# Patient Record
Sex: Female | Born: 1945 | Race: Black or African American | Hispanic: No | Smoking: Never smoker
Health system: Southern US, Community
[De-identification: ages and names within clinical notes are randomized; demographics above are authoritative.]

## PROBLEM LIST (undated history)

## (undated) DIAGNOSIS — E119 Type 2 diabetes mellitus without complications: Secondary | ICD-10-CM

## (undated) DIAGNOSIS — B1920 Unspecified viral hepatitis C without hepatic coma: Secondary | ICD-10-CM

## (undated) DIAGNOSIS — C220 Liver cell carcinoma: Secondary | ICD-10-CM

## (undated) DIAGNOSIS — C189 Malignant neoplasm of colon, unspecified: Secondary | ICD-10-CM

## (undated) DIAGNOSIS — I10 Essential (primary) hypertension: Secondary | ICD-10-CM

## (undated) DIAGNOSIS — H409 Unspecified glaucoma: Secondary | ICD-10-CM

## (undated) HISTORY — DX: Unspecified viral hepatitis C without hepatic coma: B19.20

## (undated) HISTORY — DX: Malignant neoplasm of colon, unspecified: C18.9

## (undated) HISTORY — DX: Liver cell carcinoma: C22.0

---

## 2003-02-16 HISTORY — PX: COLON RESECTION: SHX5231

## 2020-08-08 ENCOUNTER — Other Ambulatory Visit: Payer: Self-pay

## 2020-08-08 ENCOUNTER — Encounter (HOSPITAL_COMMUNITY): Payer: Self-pay

## 2020-08-08 ENCOUNTER — Inpatient Hospital Stay (HOSPITAL_COMMUNITY)
Admission: EM | Admit: 2020-08-08 | Discharge: 2020-08-18 | DRG: 327 | Disposition: A | Discharge status: Home / Self Care | Payer: Medicaid Other | Attending: Surgery | Admitting: Surgery

## 2020-08-08 DIAGNOSIS — J90 Pleural effusion, not elsewhere classified: Secondary | ICD-10-CM

## 2020-08-08 DIAGNOSIS — Z9049 Acquired absence of other specified parts of digestive tract: Secondary | ICD-10-CM

## 2020-08-08 DIAGNOSIS — K5909 Other constipation: Secondary | ICD-10-CM

## 2020-08-08 DIAGNOSIS — E860 Dehydration: Secondary | ICD-10-CM | POA: Diagnosis present

## 2020-08-08 DIAGNOSIS — R160 Hepatomegaly, not elsewhere classified: Secondary | ICD-10-CM

## 2020-08-08 DIAGNOSIS — E876 Hypokalemia: Secondary | ICD-10-CM | POA: Diagnosis present

## 2020-08-08 DIAGNOSIS — K631 Perforation of intestine (nontraumatic): Secondary | ICD-10-CM

## 2020-08-08 DIAGNOSIS — E871 Hypo-osmolality and hyponatremia: Secondary | ICD-10-CM | POA: Diagnosis present

## 2020-08-08 DIAGNOSIS — C220 Liver cell carcinoma: Secondary | ICD-10-CM

## 2020-08-08 DIAGNOSIS — N183 Chronic kidney disease, stage 3 unspecified: Secondary | ICD-10-CM | POA: Diagnosis present

## 2020-08-08 DIAGNOSIS — B1920 Unspecified viral hepatitis C without hepatic coma: Secondary | ICD-10-CM | POA: Diagnosis present

## 2020-08-08 DIAGNOSIS — K255 Chronic or unspecified gastric ulcer with perforation: Principal | ICD-10-CM | POA: Diagnosis present

## 2020-08-08 DIAGNOSIS — E1122 Type 2 diabetes mellitus with diabetic chronic kidney disease: Secondary | ICD-10-CM | POA: Diagnosis present

## 2020-08-08 DIAGNOSIS — R188 Other ascites: Secondary | ICD-10-CM | POA: Diagnosis present

## 2020-08-08 DIAGNOSIS — R0902 Hypoxemia: Secondary | ICD-10-CM | POA: Diagnosis not present

## 2020-08-08 DIAGNOSIS — H409 Unspecified glaucoma: Secondary | ICD-10-CM | POA: Diagnosis present

## 2020-08-08 DIAGNOSIS — Z9889 Other specified postprocedural states: Secondary | ICD-10-CM

## 2020-08-08 DIAGNOSIS — Z794 Long term (current) use of insulin: Secondary | ICD-10-CM

## 2020-08-08 DIAGNOSIS — Z20822 Contact with and (suspected) exposure to covid-19: Secondary | ICD-10-CM | POA: Diagnosis present

## 2020-08-08 DIAGNOSIS — N179 Acute kidney failure, unspecified: Secondary | ICD-10-CM | POA: Diagnosis not present

## 2020-08-08 DIAGNOSIS — D62 Acute posthemorrhagic anemia: Secondary | ICD-10-CM | POA: Diagnosis not present

## 2020-08-08 DIAGNOSIS — E1159 Type 2 diabetes mellitus with other circulatory complications: Secondary | ICD-10-CM

## 2020-08-08 DIAGNOSIS — I129 Hypertensive chronic kidney disease with stage 1 through stage 4 chronic kidney disease, or unspecified chronic kidney disease: Secondary | ICD-10-CM | POA: Diagnosis present

## 2020-08-08 DIAGNOSIS — E119 Type 2 diabetes mellitus without complications: Secondary | ICD-10-CM

## 2020-08-08 DIAGNOSIS — I9589 Other hypotension: Secondary | ICD-10-CM | POA: Diagnosis present

## 2020-08-08 DIAGNOSIS — Z23 Encounter for immunization: Secondary | ICD-10-CM

## 2020-08-08 DIAGNOSIS — I152 Hypertension secondary to endocrine disorders: Secondary | ICD-10-CM

## 2020-08-08 DIAGNOSIS — Z85048 Personal history of other malignant neoplasm of rectum, rectosigmoid junction, and anus: Secondary | ICD-10-CM

## 2020-08-08 DIAGNOSIS — E1165 Type 2 diabetes mellitus with hyperglycemia: Secondary | ICD-10-CM | POA: Diagnosis present

## 2020-08-08 DIAGNOSIS — K668 Other specified disorders of peritoneum: Secondary | ICD-10-CM

## 2020-08-08 DIAGNOSIS — R739 Hyperglycemia, unspecified: Secondary | ICD-10-CM

## 2020-08-08 DIAGNOSIS — B9681 Helicobacter pylori [H. pylori] as the cause of diseases classified elsewhere: Secondary | ICD-10-CM | POA: Diagnosis present

## 2020-08-08 HISTORY — DX: Essential (primary) hypertension: I10

## 2020-08-08 HISTORY — DX: Unspecified glaucoma: H40.9

## 2020-08-08 HISTORY — DX: Type 2 diabetes mellitus without complications: E11.9

## 2020-08-08 MED ORDER — SODIUM CHLORIDE 0.9 % IV BOLUS
1000.0000 mL | Freq: Once | INTRAVENOUS | Status: AC
  Administered 2020-08-09: 1000 mL via INTRAVENOUS

## 2020-08-08 MED ORDER — ONDANSETRON 8 MG PO TBDP
8.0000 mg | ORAL_TABLET | Freq: Once | ORAL | Status: AC
  Administered 2020-08-08: 8 mg via ORAL
  Filled 2020-08-08: qty 1

## 2020-08-08 NOTE — ED Provider Notes (Signed)
[facility] DEPT Provider Note   CSN: [id number] Arrival date & time: 08/08/20  2156     History No chief complaint on file.   [name] [name] [name] [name] is a 75 y.o. female.  HPI  Patient with history of hypertension and diabetes presents with concerns about low blood pressure, abdominal pain, nausea, vomiting.  This started Tuesday afternoon after getting the [place] booster on Monday.  She has had multiple episodes of vomiting, 1 today.  She has been feeling weak, dehydrated.  States her abdomen started hurting 3 days ago.  It is diffuse and constant.   She has known history of a heart murmur, takes losartan and nifedipine.  No history of blood clots, not on any blood thinners.  She is not having any chest pain or shortness of breath.  She has been feeling constipated so took MiraLAX earlier today.  No past medical history on file.  There are no problems to display for this patient.      OB History   No obstetric history on file.     No family history on file.     Home Medications Prior to Admission medications   Not on File    Allergies    Patient has no allergy information on record.  Review of Systems   Review of Systems  Constitutional:  Positive for appetite change, fatigue and fever. Negative for chills.  HENT:  Negative for ear pain and sore throat.   Eyes:  Negative for pain and visual disturbance.  Respiratory:  Negative for cough, chest tightness and shortness of breath.   Cardiovascular:  Negative for chest pain and palpitations.  Gastrointestinal:  Positive for abdominal distention, abdominal pain, constipation, nausea and vomiting.  Genitourinary:  Negative for dysuria and hematuria.  Musculoskeletal:  Negative for arthralgias and back pain.  Skin:  Negative for color change and rash.  Neurological:  Positive for weakness. Negative for seizures and syncope.  All other systems reviewed and are negative.  Physical  Exam Updated Vital Signs BP (!) 157/70   Pulse 90   Temp 98.5 F (36.9 C) (Oral)   Resp 18   SpO2 95%   Physical Exam Vitals and nursing note reviewed.  Constitutional:      General: She is not in acute distress.    Appearance: She is well-developed. She is ill-appearing.     Comments: Patient appears chronically ill.  HENT:     Head: Normocephalic and atraumatic.     Mouth/Throat:     Mouth: Mucous membranes are dry.  Eyes:     Conjunctiva/sclera: Conjunctivae normal.  Cardiovascular:     Rate and Rhythm: Normal rate and regular rhythm.     Heart sounds: Murmur heard.  Pulmonary:     Effort: Pulmonary effort is normal. No respiratory distress.     Breath sounds: Normal breath sounds.  Abdominal:     General: There is distension.     Palpations: Abdomen is soft.     Tenderness: There is abdominal tenderness. There is guarding.     Comments: Abdominal tenderness diffuse.  Abdomen is distended, but has been for multiple months.  There is guarding on exam.  Musculoskeletal:     Cervical back: Neck supple.  Skin:    General: Skin is warm and dry.  Neurological:     Mental Status: She is alert.    ED Results / Procedures / Treatments   Labs (all labs ordered are listed, but only abnormal results are  displayed) Labs Reviewed - No data to display  EKG None  Radiology No results found.  Procedures Procedures   Medications Ordered in ED Medications - No data to display  ED Course  I have reviewed the triage vital signs and the nursing notes.  Pertinent labs & imaging results that were available during my care of the patient were reviewed by me and considered in my medical decision making (see chart for details).    MDM Rules/Calculators/A&P                         Patient is a 75 year old female with history of hypertension and diabetes presenting for nausea, vomiting, abdominal pain.  She is hypertensive, but otherwise vitals are stable.  Ordered abdominal  pain work-up and CT abdomen.  She is diffusely tender to palpation to the abdomen.  Abdomen is also somewhat distended.  There is guarding.    I have placed lab works for broad abdominal pain work-up.  Also put an order for CT scan pending creatinine from CMP.  Fortunately, this is very early in the work-up and at the end of my shift.    I will sign out care to [name], PA-C.  Disposition pending further work-up.  Final Clinical Impression(s) / ED Diagnoses Final diagnoses:  None    Rx / DC Orders ED Discharge Orders     None        Sherrill Raring, Hershal Coria 08/12/20 [place], Wenda Overland, MD 08/13/20 1505

## 2020-08-08 NOTE — ED Triage Notes (Signed)
Pt reports abdominal pain and N/V since Tuesday. Pt has been unable to keep anything down and the pain has become more severe.

## 2020-08-09 ENCOUNTER — Emergency Department (HOSPITAL_COMMUNITY): Payer: Medicaid Other | Admitting: Anesthesiology

## 2020-08-09 ENCOUNTER — Emergency Department (HOSPITAL_COMMUNITY): Payer: Medicaid Other

## 2020-08-09 ENCOUNTER — Encounter (HOSPITAL_COMMUNITY): Admission: EM | Disposition: A | Discharge status: Home / Self Care | Payer: Self-pay | Source: Home / Self Care

## 2020-08-09 ENCOUNTER — Encounter (HOSPITAL_COMMUNITY): Payer: Self-pay | Admitting: Radiology

## 2020-08-09 DIAGNOSIS — B1920 Unspecified viral hepatitis C without hepatic coma: Secondary | ICD-10-CM | POA: Diagnosis present

## 2020-08-09 DIAGNOSIS — C220 Liver cell carcinoma: Secondary | ICD-10-CM | POA: Diagnosis present

## 2020-08-09 DIAGNOSIS — R188 Other ascites: Secondary | ICD-10-CM | POA: Diagnosis present

## 2020-08-09 DIAGNOSIS — E860 Dehydration: Secondary | ICD-10-CM | POA: Diagnosis present

## 2020-08-09 DIAGNOSIS — K5909 Other constipation: Secondary | ICD-10-CM

## 2020-08-09 DIAGNOSIS — I129 Hypertensive chronic kidney disease with stage 1 through stage 4 chronic kidney disease, or unspecified chronic kidney disease: Secondary | ICD-10-CM | POA: Diagnosis present

## 2020-08-09 DIAGNOSIS — Z20822 Contact with and (suspected) exposure to covid-19: Secondary | ICD-10-CM | POA: Diagnosis present

## 2020-08-09 DIAGNOSIS — E1165 Type 2 diabetes mellitus with hyperglycemia: Secondary | ICD-10-CM | POA: Diagnosis present

## 2020-08-09 DIAGNOSIS — K255 Chronic or unspecified gastric ulcer with perforation: Secondary | ICD-10-CM | POA: Diagnosis present

## 2020-08-09 DIAGNOSIS — D62 Acute posthemorrhagic anemia: Secondary | ICD-10-CM | POA: Diagnosis not present

## 2020-08-09 DIAGNOSIS — K631 Perforation of intestine (nontraumatic): Secondary | ICD-10-CM | POA: Diagnosis present

## 2020-08-09 DIAGNOSIS — E119 Type 2 diabetes mellitus without complications: Secondary | ICD-10-CM

## 2020-08-09 DIAGNOSIS — Z9049 Acquired absence of other specified parts of digestive tract: Secondary | ICD-10-CM | POA: Diagnosis not present

## 2020-08-09 DIAGNOSIS — I9589 Other hypotension: Secondary | ICD-10-CM | POA: Diagnosis present

## 2020-08-09 DIAGNOSIS — B9681 Helicobacter pylori [H. pylori] as the cause of diseases classified elsewhere: Secondary | ICD-10-CM | POA: Diagnosis present

## 2020-08-09 DIAGNOSIS — R160 Hepatomegaly, not elsewhere classified: Secondary | ICD-10-CM

## 2020-08-09 DIAGNOSIS — N179 Acute kidney failure, unspecified: Secondary | ICD-10-CM | POA: Diagnosis not present

## 2020-08-09 DIAGNOSIS — R0902 Hypoxemia: Secondary | ICD-10-CM | POA: Diagnosis not present

## 2020-08-09 DIAGNOSIS — E871 Hypo-osmolality and hyponatremia: Secondary | ICD-10-CM | POA: Diagnosis present

## 2020-08-09 DIAGNOSIS — Z85048 Personal history of other malignant neoplasm of rectum, rectosigmoid junction, and anus: Secondary | ICD-10-CM

## 2020-08-09 DIAGNOSIS — Z794 Long term (current) use of insulin: Secondary | ICD-10-CM

## 2020-08-09 DIAGNOSIS — K668 Other specified disorders of peritoneum: Secondary | ICD-10-CM

## 2020-08-09 DIAGNOSIS — Z23 Encounter for immunization: Secondary | ICD-10-CM | POA: Diagnosis not present

## 2020-08-09 DIAGNOSIS — H409 Unspecified glaucoma: Secondary | ICD-10-CM | POA: Diagnosis present

## 2020-08-09 DIAGNOSIS — E876 Hypokalemia: Secondary | ICD-10-CM | POA: Diagnosis present

## 2020-08-09 DIAGNOSIS — E1159 Type 2 diabetes mellitus with other circulatory complications: Secondary | ICD-10-CM

## 2020-08-09 DIAGNOSIS — N183 Chronic kidney disease, stage 3 unspecified: Secondary | ICD-10-CM | POA: Diagnosis present

## 2020-08-09 DIAGNOSIS — I10 Essential (primary) hypertension: Secondary | ICD-10-CM

## 2020-08-09 DIAGNOSIS — E1122 Type 2 diabetes mellitus with diabetic chronic kidney disease: Secondary | ICD-10-CM | POA: Diagnosis present

## 2020-08-09 HISTORY — PX: LAPAROSCOPIC GASTRIC RESECTION: SHX1936

## 2020-08-09 LAB — COMPREHENSIVE METABOLIC PANEL
ALT: 63 U/L — ABNORMAL HIGH (ref 0–44)
AST: 43 U/L — ABNORMAL HIGH (ref 15–41)
Albumin: 3.9 g/dL (ref 3.5–5.0)
Alkaline Phosphatase: 157 U/L — ABNORMAL HIGH (ref 38–126)
Anion gap: 10 (ref 5–15)
BUN: 22 mg/dL (ref 8–23)
CO2: 24 mmol/L (ref 22–32)
Calcium: 10.2 mg/dL (ref 8.9–10.3)
Chloride: 98 mmol/L (ref 98–111)
Creatinine, Ser: 0.71 mg/dL (ref 0.44–1.00)
GFR, Estimated: >60 mL/min (ref >60)
Glucose, Bld: 118 mg/dL — ABNORMAL HIGH (ref 70–99)
Potassium: 3.2 mmol/L — ABNORMAL LOW (ref 3.5–5.1)
Sodium: 132 mmol/L — ABNORMAL LOW (ref 135–145)
Total Bilirubin: 1.1 mg/dL (ref 0.3–1.2)
Total Protein: 7.9 g/dL (ref 6.5–8.1)

## 2020-08-09 LAB — CBC WITH DIFFERENTIAL/PLATELET
Abs Immature Granulocytes: 0.02 10*3/uL (ref 0.00–0.07)
Basophils Absolute: 0 10*3/uL (ref 0.0–0.1)
Basophils Relative: 0 %
Eosinophils Absolute: 0 10*3/uL (ref 0.0–0.5)
Eosinophils Relative: 0 %
HCT: 46.3 % — ABNORMAL HIGH (ref 36.0–46.0)
Hemoglobin: 15.6 g/dL — ABNORMAL HIGH (ref 12.0–15.0)
Immature Granulocytes: 0 %
Lymphocytes Relative: 6 %
Lymphs Abs: 0.5 10*3/uL — ABNORMAL LOW (ref 0.7–4.0)
MCH: 30.8 pg (ref 26.0–34.0)
MCHC: 33.7 g/dL (ref 30.0–36.0)
MCV: 91.5 fL (ref 80.0–100.0)
Monocytes Absolute: 0.5 10*3/uL (ref 0.1–1.0)
Monocytes Relative: 5 %
Neutro Abs: 8.6 10*3/uL — ABNORMAL HIGH (ref 1.7–7.7)
Neutrophils Relative %: 89 %
Platelets: 248 10*3/uL (ref 150–400)
RBC: 5.06 MIL/uL (ref 3.87–5.11)
RDW: 13.9 % (ref 11.5–15.5)
WBC: 9.7 10*3/uL (ref 4.0–10.5)
nRBC: 0 % (ref 0.0–0.2)

## 2020-08-09 LAB — URINALYSIS, ROUTINE W REFLEX MICROSCOPIC
Bilirubin Urine: NEGATIVE
Glucose, UA: NEGATIVE mg/dL
Hgb urine dipstick: NEGATIVE
Ketones, ur: 20 mg/dL — AB
Nitrite: NEGATIVE
Protein, ur: NEGATIVE mg/dL
Specific Gravity, Urine: 1.016 (ref 1.005–1.030)
pH: 5 (ref 5.0–8.0)

## 2020-08-09 LAB — GLUCOSE, CAPILLARY
Glucose-Capillary: 149 mg/dL — ABNORMAL HIGH (ref 70–99)
Glucose-Capillary: 296 mg/dL — ABNORMAL HIGH (ref 70–99)
Glucose-Capillary: 279 mg/dL — ABNORMAL HIGH (ref 70–99)

## 2020-08-09 LAB — RESP PANEL BY RT-PCR (FLU A&B, COVID) ARPGX2
Influenza A by PCR: NEGATIVE
Influenza B by PCR: NEGATIVE
SARS Coronavirus 2 by RT PCR: NEGATIVE

## 2020-08-09 LAB — HEMOGLOBIN A1C
Hgb A1c MFr Bld: 5.8 % — ABNORMAL HIGH (ref 4.8–5.6)
Mean Plasma Glucose: 119.76 mg/dL

## 2020-08-09 LAB — LIPASE, BLOOD: Lipase: 46 U/L (ref 11–51)

## 2020-08-09 SURGERY — LAPAROSCOPIC GASTRIC RESECTION
Anesthesia: General | Site: Abdomen

## 2020-08-09 MED ORDER — ENOXAPARIN SODIUM 40 MG/0.4ML IJ SOSY
40.0000 mg | PREFILLED_SYRINGE | INTRAMUSCULAR | Status: DC
  Administered 2020-08-10: 40 mg via SUBCUTANEOUS
  Filled 2020-08-09: qty 0.4

## 2020-08-09 MED ORDER — FENTANYL CITRATE (PF) 100 MCG/2ML IJ SOLN: 25.0000 ug | INTRAMUSCULAR | Status: DC | PRN

## 2020-08-09 MED ORDER — SODIUM CHLORIDE 0.9 % IV SOLN: Freq: Three times a day (TID) | INTRAVENOUS | Status: DC | PRN

## 2020-08-09 MED ORDER — DEXAMETHASONE SODIUM PHOSPHATE 10 MG/ML IJ SOLN
INTRAMUSCULAR | Status: DC | PRN
  Administered 2020-08-09: 5 mg via INTRAVENOUS

## 2020-08-09 MED ORDER — ROCURONIUM BROMIDE 10 MG/ML (PF) SYRINGE
PREFILLED_SYRINGE | INTRAVENOUS | Status: AC
  Filled 2020-08-09: qty 10

## 2020-08-09 MED ORDER — SUCCINYLCHOLINE CHLORIDE 200 MG/10ML IV SOSY
PREFILLED_SYRINGE | INTRAVENOUS | Status: AC
  Filled 2020-08-09: qty 30

## 2020-08-09 MED ORDER — FENTANYL CITRATE (PF) 100 MCG/2ML IJ SOLN
INTRAMUSCULAR | Status: AC
  Filled 2020-08-09: qty 2

## 2020-08-09 MED ORDER — ONDANSETRON HCL 4 MG/2ML IJ SOLN
INTRAMUSCULAR | Status: AC
  Filled 2020-08-09: qty 2

## 2020-08-09 MED ORDER — ONDANSETRON HCL 4 MG/2ML IJ SOLN: 4.0000 mg | Freq: Four times a day (QID) | INTRAMUSCULAR | Status: DC | PRN

## 2020-08-09 MED ORDER — LACTATED RINGERS IV SOLN: INTRAVENOUS | Status: DC | PRN

## 2020-08-09 MED ORDER — DIPHENHYDRAMINE HCL 50 MG/ML IJ SOLN
12.5000 mg | Freq: Four times a day (QID) | INTRAMUSCULAR | Status: DC | PRN
  Administered 2020-08-14: 12.5 mg via INTRAVENOUS
  Filled 2020-08-09: qty 1

## 2020-08-09 MED ORDER — CHLORHEXIDINE GLUCONATE CLOTH 2 % EX PADS
6.0000 | MEDICATED_PAD | Freq: Once | CUTANEOUS | Status: AC
  Administered 2020-08-09: 6 via TOPICAL

## 2020-08-09 MED ORDER — EPHEDRINE 5 MG/ML INJ
INTRAVENOUS | Status: AC
  Filled 2020-08-09: qty 20

## 2020-08-09 MED ORDER — SODIUM CHLORIDE 0.9 % IV SOLN
2.0000 g | INTRAVENOUS | Status: AC
  Administered 2020-08-09: 2 g via INTRAVENOUS
  Filled 2020-08-09: qty 2

## 2020-08-09 MED ORDER — SIMETHICONE 80 MG PO CHEW
40.0000 mg | CHEWABLE_TABLET | Freq: Four times a day (QID) | ORAL | Status: DC | PRN
  Administered 2020-08-17 – 2020-08-18 (×2): 40 mg via ORAL
  Filled 2020-08-09 (×2): qty 1

## 2020-08-09 MED ORDER — ACETAMINOPHEN 650 MG RE SUPP
650.0000 mg | Freq: Four times a day (QID) | RECTAL | Status: DC | PRN
  Administered 2020-08-11: 650 mg via RECTAL
  Filled 2020-08-09: qty 1

## 2020-08-09 MED ORDER — PANTOPRAZOLE SODIUM 40 MG IV SOLR
40.0000 mg | Freq: Two times a day (BID) | INTRAVENOUS | Status: DC
  Administered 2020-08-12 – 2020-08-18 (×12): 40 mg via INTRAVENOUS
  Filled 2020-08-09 (×12): qty 40

## 2020-08-09 MED ORDER — INSULIN ASPART 100 UNIT/ML IJ SOLN: 0.0000 [IU] | Freq: Three times a day (TID) | INTRAMUSCULAR | Status: DC

## 2020-08-09 MED ORDER — MENTHOL 3 MG MT LOZG
1.0000 | LOZENGE | OROMUCOSAL | Status: DC | PRN
Start: 2020-08-09 — End: 2020-08-18
  Filled 2020-08-09: qty 9

## 2020-08-09 MED ORDER — PIPERACILLIN-TAZOBACTAM 3.375 G IVPB 30 MIN
3.3750 g | Freq: Once | INTRAVENOUS | Status: AC
  Administered 2020-08-09: 3.375 g via INTRAVENOUS
  Filled 2020-08-09: qty 50

## 2020-08-09 MED ORDER — ROCURONIUM BROMIDE 10 MG/ML (PF) SYRINGE
PREFILLED_SYRINGE | INTRAVENOUS | Status: DC | PRN
  Administered 2020-08-09: 20 mg via INTRAVENOUS
  Administered 2020-08-09: 40 mg via INTRAVENOUS
  Administered 2020-08-09: 20 mg via INTRAVENOUS
  Administered 2020-08-09: 40 mg via INTRAVENOUS
  Administered 2020-08-09: 20 mg via INTRAVENOUS

## 2020-08-09 MED ORDER — ALUM & MAG HYDROXIDE-SIMETH 200-200-20 MG/5ML PO SUSP: 30.0000 mL | Freq: Four times a day (QID) | ORAL | Status: DC | PRN

## 2020-08-09 MED ORDER — PANTOPRAZOLE INFUSION (NEW) - SIMPLE MED
8.0000 mg/h | INTRAVENOUS | Status: AC
  Administered 2020-08-09 – 2020-08-12 (×7): 8 mg/h via INTRAVENOUS
  Filled 2020-08-09 (×2): qty 80
  Filled 2020-08-09 (×2): qty 100
  Filled 2020-08-09 (×2): qty 80
  Filled 2020-08-09: qty 100
  Filled 2020-08-09 (×2): qty 80
  Filled 2020-08-09: qty 100

## 2020-08-09 MED ORDER — INSULIN ASPART 100 UNIT/ML IJ SOLN
0.0000 [IU] | INTRAMUSCULAR | Status: DC
  Administered 2020-08-09: 8 [IU] via SUBCUTANEOUS
  Administered 2020-08-09: 3 [IU] via SUBCUTANEOUS
  Administered 2020-08-10: 2 [IU] via SUBCUTANEOUS
  Administered 2020-08-10: 3 [IU] via SUBCUTANEOUS
  Administered 2020-08-10: 2 [IU] via SUBCUTANEOUS
  Administered 2020-08-10: 8 [IU] via SUBCUTANEOUS
  Administered 2020-08-10: 3 [IU] via SUBCUTANEOUS
  Administered 2020-08-10: 5 [IU] via SUBCUTANEOUS
  Administered 2020-08-11 – 2020-08-12 (×4): 2 [IU] via SUBCUTANEOUS
  Administered 2020-08-12: 3 [IU] via SUBCUTANEOUS
  Administered 2020-08-12 – 2020-08-13 (×3): 2 [IU] via SUBCUTANEOUS
  Administered 2020-08-13: 3 [IU] via SUBCUTANEOUS
  Administered 2020-08-13 (×2): 2 [IU] via SUBCUTANEOUS
  Administered 2020-08-14: 3 [IU] via SUBCUTANEOUS
  Administered 2020-08-14: 5 [IU] via SUBCUTANEOUS
  Administered 2020-08-14: 2 [IU] via SUBCUTANEOUS
  Administered 2020-08-14: 3 [IU] via SUBCUTANEOUS
  Administered 2020-08-14: 2 [IU] via SUBCUTANEOUS
  Administered 2020-08-15 (×3): 3 [IU] via SUBCUTANEOUS
  Administered 2020-08-15: 5 [IU] via SUBCUTANEOUS
  Administered 2020-08-15 (×3): 3 [IU] via SUBCUTANEOUS
  Administered 2020-08-16: 5 [IU] via SUBCUTANEOUS
  Administered 2020-08-16: 8 [IU] via SUBCUTANEOUS
  Administered 2020-08-16 (×2): 5 [IU] via SUBCUTANEOUS
  Administered 2020-08-16 – 2020-08-17 (×3): 3 [IU] via SUBCUTANEOUS
  Administered 2020-08-17: 11 [IU] via SUBCUTANEOUS
  Administered 2020-08-17: 3 [IU] via SUBCUTANEOUS
  Administered 2020-08-17: 2 [IU] via SUBCUTANEOUS
  Administered 2020-08-17 (×2): 5 [IU] via SUBCUTANEOUS
  Administered 2020-08-18: 2 [IU] via SUBCUTANEOUS
  Administered 2020-08-18: 3 [IU] via SUBCUTANEOUS

## 2020-08-09 MED ORDER — LACTATED RINGERS IR SOLN
Status: DC | PRN
  Administered 2020-08-09: 1000 mL

## 2020-08-09 MED ORDER — LIDOCAINE 2% (20 MG/ML) 5 ML SYRINGE
INTRAMUSCULAR | Status: AC
  Filled 2020-08-09: qty 15

## 2020-08-09 MED ORDER — ENALAPRILAT 1.25 MG/ML IV SOLN
0.6250 mg | Freq: Four times a day (QID) | INTRAVENOUS | Status: DC | PRN
  Filled 2020-08-09: qty 1

## 2020-08-09 MED ORDER — PROPOFOL 10 MG/ML IV BOLUS
INTRAVENOUS | Status: DC | PRN
  Administered 2020-08-09: 110 mg via INTRAVENOUS

## 2020-08-09 MED ORDER — ALBUMIN HUMAN 5 % IV SOLN
12.5000 g | Freq: Four times a day (QID) | INTRAVENOUS | Status: AC | PRN
  Filled 2020-08-09: qty 250

## 2020-08-09 MED ORDER — SUGAMMADEX SODIUM 500 MG/5ML IV SOLN
INTRAVENOUS | Status: DC | PRN
  Administered 2020-08-09: 500 mg via INTRAVENOUS

## 2020-08-09 MED ORDER — LACTATED RINGERS IV SOLN: INTRAVENOUS | Status: DC

## 2020-08-09 MED ORDER — BISACODYL 10 MG RE SUPP
10.0000 mg | Freq: Every day | RECTAL | Status: DC
  Administered 2020-08-11 – 2020-08-18 (×6): 10 mg via RECTAL
  Filled 2020-08-09 (×8): qty 1

## 2020-08-09 MED ORDER — FENTANYL CITRATE (PF) 100 MCG/2ML IJ SOLN
INTRAMUSCULAR | Status: DC | PRN
  Administered 2020-08-09: 25 ug via INTRAVENOUS
  Administered 2020-08-09: 50 ug via INTRAVENOUS
  Administered 2020-08-09: 25 ug via INTRAVENOUS
  Administered 2020-08-09: 50 ug via INTRAVENOUS

## 2020-08-09 MED ORDER — HYDROMORPHONE HCL 1 MG/ML IJ SOLN
0.5000 mg | INTRAMUSCULAR | Status: DC | PRN
  Administered 2020-08-09 – 2020-08-10 (×2): 1 mg via INTRAVENOUS
  Filled 2020-08-09 (×2): qty 1

## 2020-08-09 MED ORDER — 0.9 % SODIUM CHLORIDE (POUR BTL) OPTIME
TOPICAL | Status: DC | PRN
  Administered 2020-08-09: 2000 mL

## 2020-08-09 MED ORDER — LIP MEDEX EX OINT
1.0000 "application " | TOPICAL_OINTMENT | Freq: Two times a day (BID) | CUTANEOUS | Status: DC
  Administered 2020-08-09 – 2020-08-17 (×18): 1 via TOPICAL
  Filled 2020-08-09 (×4): qty 7

## 2020-08-09 MED ORDER — METOPROLOL TARTRATE 5 MG/5ML IV SOLN: 5.0000 mg | Freq: Four times a day (QID) | INTRAVENOUS | Status: DC | PRN

## 2020-08-09 MED ORDER — EPHEDRINE SULFATE-NACL 50-0.9 MG/10ML-% IV SOSY
PREFILLED_SYRINGE | INTRAVENOUS | Status: DC | PRN
  Administered 2020-08-09: 10 mg via INTRAVENOUS

## 2020-08-09 MED ORDER — METHOCARBAMOL 1000 MG/10ML IJ SOLN
1000.0000 mg | Freq: Four times a day (QID) | INTRAVENOUS | Status: DC | PRN
  Filled 2020-08-09: qty 10

## 2020-08-09 MED ORDER — PROCHLORPERAZINE EDISYLATE 10 MG/2ML IJ SOLN: 5.0000 mg | INTRAMUSCULAR | Status: DC | PRN

## 2020-08-09 MED ORDER — GABAPENTIN 300 MG PO CAPS
300.0000 mg | ORAL_CAPSULE | ORAL | Status: AC
  Administered 2020-08-09: 300 mg via ORAL
  Filled 2020-08-09: qty 1

## 2020-08-09 MED ORDER — PROPOFOL 10 MG/ML IV BOLUS
INTRAVENOUS | Status: AC
  Filled 2020-08-09: qty 20

## 2020-08-09 MED ORDER — PIPERACILLIN-TAZOBACTAM 3.375 G IVPB
3.3750 g | Freq: Three times a day (TID) | INTRAVENOUS | Status: DC
  Administered 2020-08-09 – 2020-08-13 (×13): 3.375 g via INTRAVENOUS
  Filled 2020-08-09 (×13): qty 50

## 2020-08-09 MED ORDER — PHENOL 1.4 % MT LIQD
2.0000 | OROMUCOSAL | Status: DC | PRN
Start: 2020-08-09 — End: 2020-08-18
  Filled 2020-08-09: qty 177

## 2020-08-09 MED ORDER — SUCCINYLCHOLINE CHLORIDE 200 MG/10ML IV SOSY
PREFILLED_SYRINGE | INTRAVENOUS | Status: DC | PRN
  Administered 2020-08-09: 100 mg via INTRAVENOUS

## 2020-08-09 MED ORDER — LACTATED RINGERS IV BOLUS: 1000.0000 mL | Freq: Three times a day (TID) | INTRAVENOUS | Status: AC | PRN

## 2020-08-09 MED ORDER — PHENYLEPHRINE HCL (PRESSORS) 10 MG/ML IV SOLN
INTRAVENOUS | Status: AC
  Filled 2020-08-09: qty 1

## 2020-08-09 MED ORDER — LACTATED RINGERS IV BOLUS
1000.0000 mL | Freq: Once | INTRAVENOUS | Status: AC
  Administered 2020-08-09: 1000 mL via INTRAVENOUS

## 2020-08-09 MED ORDER — SIMETHICONE 40 MG/0.6ML PO SUSP
80.0000 mg | Freq: Four times a day (QID) | ORAL | Status: DC | PRN
  Filled 2020-08-09: qty 1.2

## 2020-08-09 MED ORDER — PANTOPRAZOLE 80MG IVPB - SIMPLE MED
80.0000 mg | Freq: Once | INTRAVENOUS | Status: AC
  Administered 2020-08-09: 80 mg via INTRAVENOUS
  Filled 2020-08-09: qty 80

## 2020-08-09 MED ORDER — SODIUM CHLORIDE 0.9 % IV BOLUS
1000.0000 mL | Freq: Once | INTRAVENOUS | Status: AC
  Administered 2020-08-09: 1000 mL via INTRAVENOUS

## 2020-08-09 MED ORDER — SUCCINYLCHOLINE CHLORIDE 200 MG/10ML IV SOSY
PREFILLED_SYRINGE | INTRAVENOUS | Status: AC
  Filled 2020-08-09: qty 10

## 2020-08-09 MED ORDER — MAGIC MOUTHWASH
15.0000 mL | Freq: Four times a day (QID) | ORAL | Status: DC | PRN
  Administered 2020-08-10: 15 mL via ORAL
  Filled 2020-08-09 (×3): qty 15

## 2020-08-09 MED ORDER — PHENYLEPHRINE HCL-NACL 10-0.9 MG/250ML-% IV SOLN
INTRAVENOUS | Status: DC | PRN
  Administered 2020-08-09: 50 ug/min via INTRAVENOUS

## 2020-08-09 MED ORDER — PANTOPRAZOLE SODIUM 40 MG IV SOLR: 40.0000 mg | Freq: Two times a day (BID) | INTRAVENOUS | Status: DC

## 2020-08-09 MED ORDER — CHLORHEXIDINE GLUCONATE CLOTH 2 % EX PADS
6.0000 | MEDICATED_PAD | Freq: Every day | CUTANEOUS | Status: DC
  Administered 2020-08-10 – 2020-08-17 (×8): 6 via TOPICAL

## 2020-08-09 MED ORDER — BISACODYL 10 MG RE SUPP: 10.0000 mg | Freq: Two times a day (BID) | RECTAL | Status: DC | PRN

## 2020-08-09 MED ORDER — LIDOCAINE HCL (CARDIAC) PF 100 MG/5ML IV SOSY
PREFILLED_SYRINGE | INTRAVENOUS | Status: DC | PRN
  Administered 2020-08-09: 40 mg via INTRAVENOUS

## 2020-08-09 MED ORDER — ALBUMIN HUMAN 5 % IV SOLN: INTRAVENOUS | Status: DC | PRN

## 2020-08-09 MED ORDER — ONDANSETRON HCL 4 MG/2ML IJ SOLN
INTRAMUSCULAR | Status: DC | PRN
  Administered 2020-08-09: 4 mg via INTRAVENOUS

## 2020-08-09 MED ORDER — PNEUMOCOCCAL VAC POLYVALENT 25 MCG/0.5ML IJ INJ
0.5000 mL | INJECTION | INTRAMUSCULAR | Status: AC
  Administered 2020-08-18: 0.5 mL via INTRAMUSCULAR
  Filled 2020-08-09: qty 0.5

## 2020-08-09 MED ORDER — SODIUM CHLORIDE 0.9 % IV SOLN
8.0000 mg | Freq: Four times a day (QID) | INTRAVENOUS | Status: DC | PRN
  Filled 2020-08-09: qty 4

## 2020-08-09 MED ORDER — SODIUM CHLORIDE (PF) 0.9 % IJ SOLN
INTRAMUSCULAR | Status: AC
  Filled 2020-08-09: qty 50

## 2020-08-09 MED ORDER — LIDOCAINE 2% (20 MG/ML) 5 ML SYRINGE
INTRAMUSCULAR | Status: AC
  Filled 2020-08-09: qty 5

## 2020-08-09 MED ORDER — IOHEXOL 300 MG/ML  SOLN
100.0000 mL | Freq: Once | INTRAMUSCULAR | Status: AC | PRN
  Administered 2020-08-09: 100 mL via INTRAVENOUS

## 2020-08-09 MED ORDER — BUPIVACAINE-EPINEPHRINE (PF) 0.25% -1:200000 IJ SOLN
INTRAMUSCULAR | Status: AC
  Filled 2020-08-09: qty 30

## 2020-08-09 MED ORDER — BUPIVACAINE-EPINEPHRINE 0.25% -1:200000 IJ SOLN
INTRAMUSCULAR | Status: DC | PRN
  Administered 2020-08-09: 6 mL

## 2020-08-09 MED ORDER — ROCURONIUM BROMIDE 10 MG/ML (PF) SYRINGE
PREFILLED_SYRINGE | INTRAVENOUS | Status: AC
  Filled 2020-08-09: qty 30

## 2020-08-09 MED ORDER — DEXAMETHASONE SODIUM PHOSPHATE 10 MG/ML IJ SOLN
INTRAMUSCULAR | Status: AC
  Filled 2020-08-09: qty 1

## 2020-08-09 SURGICAL SUPPLY — 48 items
CABLE HIGH FREQUENCY MONO STRZ (ELECTRODE) ×2 IMPLANT
COVER SURGICAL LIGHT HANDLE (MISCELLANEOUS) ×2 IMPLANT
DECANTER SPIKE VIAL GLASS SM (MISCELLANEOUS) ×2 IMPLANT
DRAPE UTILITY XL STRL (DRAPES) ×2 IMPLANT
DRAPE WARM FLUID 44X44 (DRAPES) ×2 IMPLANT
DRSG TEGADERM 2-3/8X2-3/4 SM (GAUZE/BANDAGES/DRESSINGS) ×8 IMPLANT
DRSG TEGADERM 4X4.75 (GAUZE/BANDAGES/DRESSINGS) IMPLANT
ELECT COAG MONOPOLAR (ELECTROSURGICAL) ×2
ELECT REM PT RETURN 15FT ADLT (MISCELLANEOUS) ×2 IMPLANT
ELECTRODE COAG MONOPOLAR (ELECTROSURGICAL) ×1 IMPLANT
GAUZE SPONGE 2X2 8PLY STRL LF (GAUZE/BANDAGES/DRESSINGS) ×1 IMPLANT
GLOVE SURG ENC MOIS LTX SZ7.5 (GLOVE) ×2 IMPLANT
GLOVE SURG ENC MOIS LTX SZ8 (GLOVE) ×2 IMPLANT
GLOVE SURG LTX SZ8 (GLOVE) ×4 IMPLANT
GLOVE SURG UNDER LTX SZ8 (GLOVE) ×2 IMPLANT
GOWN STRL REUS W/TWL XL LVL3 (GOWN DISPOSABLE) ×6 IMPLANT
IRRIG SUCT STRYKERFLOW 2 WTIP (MISCELLANEOUS) ×2
IRRIGATION SUCT STRKRFLW 2 WTP (MISCELLANEOUS) ×1 IMPLANT
KIT BASIN OR (CUSTOM PROCEDURE TRAY) ×2 IMPLANT
KIT TURNOVER KIT A (KITS) ×2 IMPLANT
NEEDLE BIOPSY 14X6 SOFT TISS (NEEDLE) ×2 IMPLANT
PAD POSITIONING PINK XL (MISCELLANEOUS) ×2 IMPLANT
PROTECTOR NERVE ULNAR (MISCELLANEOUS) ×2 IMPLANT
SCISSORS LAP 5X35 DISP (ENDOMECHANICALS) ×2 IMPLANT
SEALER TISSUE G2 STRG ARTC 35C (ENDOMECHANICALS) IMPLANT
SET TUBE SMOKE EVAC HIGH FLOW (TUBING) ×2 IMPLANT
SLEEVE XCEL OPT CAN 5 100 (ENDOMECHANICALS) ×4 IMPLANT
SPONGE GAUZE 2X2 8PLY STRL LF (GAUZE/BANDAGES/DRESSINGS) ×2 IMPLANT
SPONGE GAUZE 2X2 STER 10/PKG (GAUZE/BANDAGES/DRESSINGS) ×1
SUT MNCRL AB 4-0 PS2 18 (SUTURE) ×2 IMPLANT
SUT SILK 2 0 (SUTURE) ×1
SUT SILK 2 0 SH CR/8 (SUTURE) ×2 IMPLANT
SUT SILK 2-0 18XBRD TIE 12 (SUTURE) ×1 IMPLANT
SUT SILK 3 0 (SUTURE) ×1
SUT SILK 3 0 SH CR/8 (SUTURE) ×2 IMPLANT
SUT SILK 3-0 18XBRD TIE 12 (SUTURE) ×1 IMPLANT
SUT V-LOC BARB 180 2/0GR6 GS22 (SUTURE) ×4
SUT V-LOC BARB 180 2/0GR9 GS23 (SUTURE) ×2
SUTURE V-LC BRB 180 2/0GR6GS22 (SUTURE) ×2 IMPLANT
SUTURE V-LC BRB 180 2/0GR9GS23 (SUTURE) ×1 IMPLANT
SYS KII FIOS ACCESS ABD 5X100 (TROCAR) ×2
SYSTEM KII FIOS ACES ABD 5X100 (TROCAR) ×1 IMPLANT
TOWEL OR 17X26 10 PK STRL BLUE (TOWEL DISPOSABLE) ×2 IMPLANT
TOWEL OR NON WOVEN STRL DISP B (DISPOSABLE) ×2 IMPLANT
TRAY FOLEY MTR SLVR 14FR STAT (SET/KITS/TRAYS/PACK) ×2 IMPLANT
TRAY LAPAROSCOPIC (CUSTOM PROCEDURE TRAY) ×2 IMPLANT
TROCAR BLADELESS OPT 5 100 (ENDOMECHANICALS) ×2 IMPLANT
TROCAR XCEL NON-BLD 11X100MML (ENDOMECHANICALS) ×2 IMPLANT

## 2020-08-09 NOTE — H&P (Signed)
[name] [name]  February 20, 1945 [id number]  CARE TEAM:  PCP: Pcp, No  Outpatient Care Team: Patient Care Team: Pcp, No as PCP - Huston Foley, MD as Consulting Physician (General Surgery)  Inpatient Treatment Team: Treatment Team: Attending Provider: Merrily Pew, MD; Registered Nurse: Rafael Bihari, RN; Physician Assistant: Larene Pickett, PA-C; Technician: Robinette Haines; Consulting Physician: Edison Pace, Md, MD   This patient is a 75 y.o.female who presents today for surgical evaluation at the request of Quincy Carnes.   Chief complaint / Reason for evaluation: Pneumoperitoneum.  Probable perforated ulcer.  Woman recently relocated from [place].  Here with her daughter.  Speaks [place] and [place].  Has had prior abdominal surgery.  Believe it was for colon cancer.  No other treatments.  Patient struggles with some joint pain and has been intermittently taking ibuprofen and other over-the-counter pain medications.  She no severe abdominal pain yesterday afternoon.  Became more intense.  Based on concerns came to emergency room.  Examination concerning for significant abdominal pain.  CT scan done shows gas in the upper abdomen with some inflammation at the pylorus/duodenal region suspicion for perforated ulcer.  Also moderate mass of the left lateral sector of the liver.  No obstruction.  Surgical consultation requested.  Patient is here with her daughter.  She claims she is insulin requiring diabetic and has hypertension.  She normally can walk 20 minutes without much difficulty.  She tends to have some mild constipation.  She does not smoke.  No personal nor family history of inflammatory bowel disease, irritable bowel syndrome, allergy such as Celiac Sprue, dietary/dairy problems, colitis, ulcers nor gastritis.  No recent sick contacts/gastroenteritis.  No changes in diet.  No dysphagia to solids or liquids.  No significant heartburn or reflux.  No hematochezia,  hematemesis, coffee ground emesis.  No evidence of prior gastric/peptic ulceration.     Assessment  [name] [name]  75 y.o. female  Day of Surgery  Procedure(s): REPAIR OF GASTRIC ULCER  Problem List:  Principal Problem:   Pneumoperitoneum Active Problems:   History of colorectal cancer   Liver mass, left lobe   Insulin-requiring or dependent type II diabetes mellitus (HCC)   HTN (hypertension)   Constipation, chronic   Pneumoperitoneum of uncertain etiology but favor perforated PEPTIC ulcer  Plan:  IV fluids  IV antibiotics.  Emergent laparoscopic possible open exploration.  Probable omental Phillip Heal patch of perforated ulcer.  Possible need for small bowel or colon resection and ostomy.  Possible liver biopsy for a left lateral liver mass given history of cancer.  The anatomy & physiology of the digestive tract was discussed.  The pathophysiology of perforation was discussed.  Differential diagnosis such as perforated ulcer or colon, etc was discussed.   Natural history risks without surgery such as death was discussed.  I recommended abdominal exploration to diagnose & treat the source of the problem.  Laparoscopic & open techniques were discussed.   Risks such as bleeding, infection, abscess, leak, reoperation, bowel resection, possible ostomy, injury to other organs, need for repair of tissues / organs, hernia, heart attack, death, and other risks were discussed.   The risks of no intervention will lead to serious problems including death.   I expressed a good likelihood that surgery will address the problem.    Goals of post-operative recovery were discussed as well.  We will work to minimize complications although risks in an emergent setting are high.   Questions were answered.  The patient & her daughter expressed understanding & wishes to proceed with surgery.      Try and get old records of surgeries and other health issues.  Pharmacy to clarify  medication list.  VTE prophylaxis- SCDs, etc  mobilize as tolerated to help recovery  30 minutes spent in review, evaluation, examination, counseling, and coordination of care.  More than 50% of that time was spent in counseling.  Adin Hector, MD, FACS, MASCRS Esophageal, Gastrointestinal & Colorectal Surgery Robotic and Minimally Invasive Surgery  [facility] [address], [place], [place] [address] [phone] Fax [phone] Main/Paging  CONTACT INFORMATION: Weekday (9AM-5PM) concerns: Call [facility] main office at [phone] Weeknight (5PM-9AM) or Weekend/Holiday concerns: Check [email or web address] for General Surgery [facility] coverage (Please, do not use SecureChat as it is not reliable communication to operating surgeons for immediate patient care)      08/09/2020      Past Medical History:  Diagnosis Date   Diabetes mellitus without complication ([place])    Glaucoma    Hypertension     History reviewed. No pertinent surgical history.  Social History   Socioeconomic History   Marital status: Widowed    Spouse name: Not on file   Number of children: Not on file   Years of education: Not on file   Highest education level: Not on file  Occupational History   Not on file  Tobacco Use   Smoking status: Not on file   Smokeless tobacco: Not on file  Substance and Sexual Activity   Alcohol use: Not on file   Drug use: Not on file   Sexual activity: Not on file  Other Topics Concern   Not on file  Social History Narrative   Not on file   Social Determinants of Health   Financial Resource Strain: Not on file  Food Insecurity: Not on file  Transportation Needs: Not on file  Physical Activity: Not on file  Stress: Not on file  Social Connections: Not on file  Intimate Partner Violence: Not on file    History reviewed. No pertinent family history.  Current Facility-Administered Medications  Medication Dose Route Frequency  Provider Last Rate Last Admin   piperacillin-tazobactam (ZOSYN) IVPB 3.375 g  3.375 g Intravenous Once Larene Pickett, PA-C 100 mL/hr at 08/09/20 0322 3.375 g at 08/09/20 0322   No current outpatient medications on file.     Allergies  Allergen Reactions   Bactrim [Sulfamethoxazole-Trimethoprim] Swelling    ROS:   All other systems reviewed & are negative except per HPI or as noted below: Constitutional:  +fevers, +chills, no sweats.  Weight stable Eyes:  No vision changes, No discharge HENT:  No sore throats, nasal drainage Lymph: No neck swelling, No bruising easily Pulmonary:  No cough, productive sputum CV: No orthopnea, PND  Patient walks 30 minutes for about 1/2 miles without difficulty.  No exertional chest/neck/shoulder/arm pain. GI:  No personal nor family history of inflammatory bowel disease, irritable bowel syndrome, allergy such as Celiac Sprue, dietary/dairy problems, colitis, ulcers nor gastritis.  No recent sick contacts/gastroenteritis.  No travel outside the country.  No changes in diet. Renal: No UTIs, No hematuria Genital:  No drainage, bleeding, masses Musculoskeletal: No severe joint pain.  Good ROM major joints Skin:  No sores or lesions.  No rashes Heme/Lymph:  No easy bleeding.  No swollen lymph nodes Neuro: No focal weakness/numbness.  No seizures Psych: No suicidal ideation.  No hallucinations  BP [name])  177/81   Pulse (!) 110   Temp 98.5 F (36.9 C) (Oral)   Resp 18   SpO2 95%   Physical Exam: Constitutional: Not cachectic.  Hygeine adequate.  Vitals signs as above.   Eyes: Pupils reactive, normal extraocular movements. Sclera nonicteric Neuro: CN II-XII intact.  No major focal sensory defects.  No major motor deficits. Lymph: No head/neck/groin lymphadenopathy Psych:  No severe agitation.  No severe anxiety.  Judgment & insight Adequate, Oriented x4, HENT: Normocephalic, Mucus membranes moist.  No thrush.   Neck: Supple, No tracheal deviation.  No  obvious thyromegaly Chest: No pain to chest wall compression.  Good respiratory excursion.  No audible wheezing CV:  Pulses intact.  Regular rhythm.  No major extremity edema Abdomen:  Soft.  Mildly distended.  Tenderness at epigastric region midline incision with some stellate scarring but no major keloid formation.  No obvious hernia.. No incarcerated hernias.  No hepatomegaly.  No splenomegaly Gen:  No inguinal hernias.  No inguinal lymphadenopathy.   Ext: No obvious deformity or contracture no significant edema.  No cyanosis Skin: No major subcutaneous nodules.  Warm and dry.   Musculoskeletal: Severe joint rigidity .  No obvious clubbing.  No digital petechiae.     Results:   Labs: Results for orders placed or performed during the hospital encounter of 08/08/20 (from the past 48 hour(s))  CBC with Differential     Status: Abnormal   Collection Time: 08/08/20 10:24 PM  Result Value Ref Range   WBC 9.7 4.0 - 10.5 K/uL   RBC 5.06 3.87 - 5.11 MIL/uL   Hemoglobin 15.6 (H) 12.0 - 15.0 g/dL   HCT 46.3 (H) 36.0 - 46.0 %   MCV 91.5 80.0 - 100.0 fL   MCH 30.8 26.0 - 34.0 pg   MCHC 33.7 30.0 - 36.0 g/dL   RDW 13.9 11.5 - 15.5 %   Platelets 248 150 - 400 K/uL   nRBC 0.0 0.0 - 0.2 %   Neutrophils Relative % 89 %   Neutro Abs 8.6 (H) 1.7 - 7.7 K/uL   Lymphocytes Relative 6 %   Lymphs Abs 0.5 (L) 0.7 - 4.0 K/uL   Monocytes Relative 5 %   Monocytes Absolute 0.5 0.1 - 1.0 K/uL   Eosinophils Relative 0 %   Eosinophils Absolute 0.0 0.0 - 0.5 K/uL   Basophils Relative 0 %   Basophils Absolute 0.0 0.0 - 0.1 K/uL   Immature Granulocytes 0 %   Abs Immature Granulocytes 0.02 0.00 - 0.07 K/uL    Comment: Performed at [facility], [place] [address], [place], [place] [address]  Comprehensive metabolic panel     Status: Abnormal   Collection Time: 08/08/20 10:24 PM  Result Value Ref Range   Sodium 132 (L) 135 - 145 mmol/L   Potassium 3.2 (L) 3.5 - 5.1 mmol/L   Chloride 98 98 -  111 mmol/L   CO2 24 22 - 32 mmol/L   Glucose, Bld 118 (H) 70 - 99 mg/dL    Comment: Glucose reference range applies only to samples taken after fasting for at least 8 hours.   BUN 22 8 - 23 mg/dL   Creatinine, Ser 0.71 0.44 - 1.00 mg/dL   Calcium 10.2 8.9 - 10.3 mg/dL   Total Protein 7.9 6.5 - 8.1 g/dL   Albumin 3.9 3.5 - 5.0 g/dL   AST 43 (H) 15 - 41 U/L   ALT 63 (H) 0 - 44 U/L   Alkaline  Phosphatase 157 (H) 38 - 126 U/L   Total Bilirubin 1.1 0.3 - 1.2 mg/dL   GFR, Estimated >60 >60 mL/min    Comment: (NOTE) Calculated using the CKD-EPI Creatinine Equation (2021)    Anion gap 10 5 - 15    Comment: Performed at [facility], [place] [address] [place], [place] [address]  Lipase, blood     Status: None   Collection Time: 08/08/20 10:24 PM  Result Value Ref Range   Lipase 46 11 - 51 U/L    Comment: Performed at [facility], [place] [address], [place], [place] [address]  Urinalysis, Routine w reflex microscopic Urine, Clean Catch     Status: Abnormal   Collection Time: 08/08/20 10:24 PM  Result Value Ref Range   Color, Urine YELLOW YELLOW   APPearance CLEAR CLEAR   Specific Gravity, Urine 1.016 1.005 - 1.030   pH 5.0 5.0 - 8.0   Glucose, UA NEGATIVE NEGATIVE mg/dL   Hgb urine dipstick NEGATIVE NEGATIVE   Bilirubin Urine NEGATIVE NEGATIVE   Ketones, ur 20 (A) NEGATIVE mg/dL   Protein, ur NEGATIVE NEGATIVE mg/dL   Nitrite NEGATIVE NEGATIVE   Leukocytes,Ua MODERATE (A) NEGATIVE   RBC / HPF 0-5 0 - 5 RBC/hpf   WBC, UA 11-20 0 - 5 WBC/hpf   Bacteria, UA RARE (A) NONE SEEN   Squamous Epithelial / LPF 0-5 0 - 5   Mucus PRESENT     Comment: Performed at [facility], [place] [address] [place], [place] [address]  Resp Panel by RT-PCR (Flu A&B, Covid) Nasopharyngeal Swab     Status: None   Collection Time: 08/08/20 11:35 PM   Specimen: Nasopharyngeal Swab; Nasopharyngeal(NP) swabs in vial transport medium  Result Value Ref Range    SARS Coronavirus 2 by RT PCR NEGATIVE NEGATIVE    Comment: (NOTE) SARS-CoV-2 target nucleic acids are NOT DETECTED.  The SARS-CoV-2 RNA is generally detectable in upper respiratory specimens during the acute phase of infection. The lowest concentration of SARS-CoV-2 viral copies this assay can detect is 138 copies/mL. A negative result does not preclude SARS-Cov-2 infection and should not be used as the sole basis for treatment or other patient management decisions. A negative result may occur with  improper specimen collection/handling, submission of specimen other than nasopharyngeal swab, presence of viral mutation(s) within the areas targeted by this assay, and inadequate number of viral copies(<138 copies/mL). A negative result must be combined with clinical observations, patient history, and epidemiological information. The expected result is Negative.  Fact Sheet for Patients:  EntrepreneurPulse.com.au  Fact Sheet for Healthcare Providers:  IncredibleEmployment.be  This test is no t yet approved or cleared by the [place] FDA and  has been authorized for detection and/or diagnosis of SARS-CoV-2 by FDA under an Emergency Use Authorization (EUA). This EUA will remain  in effect (meaning this test can be used) for the duration of the COVID-19 declaration under Section 564(b)(1) of the Act, 21 U.S.C.section 360bbb-3(b)(1), unless the authorization is terminated  or revoked sooner.       Influenza A by PCR NEGATIVE NEGATIVE   Influenza B by PCR NEGATIVE NEGATIVE    Comment: (NOTE) The Xpert Xpress SARS-CoV-2/FLU/RSV plus assay is intended as an aid in the diagnosis of influenza from Nasopharyngeal swab specimens and should not be used as a sole basis for treatment. Nasal washings and aspirates are unacceptable for Xpert Xpress SARS-CoV-2/FLU/RSV testing.  Fact Sheet for Patients: EntrepreneurPulse.com.au  Fact  Sheet for Healthcare Providers: IncredibleEmployment.be  This test is not yet approved or cleared by the [place] and has been authorized for detection and/or diagnosis of SARS-CoV-2 by FDA under an Emergency Use Authorization (EUA). This EUA will remain in effect (meaning this test can be used) for the duration of the COVID-19 declaration under Section 564(b)(1) of the Act, 21 U.S.C. section 360bbb-3(b)(1), unless the authorization is terminated or revoked.  Performed at [facility], [place] [address], [place], [place] [address]     Imaging / Studies: CT ABDOMEN PELVIS W CONTRAST  Result Date: 08/09/2020 CLINICAL DATA:  Abdominal distension with nausea and vomiting. History of colon cancer. EXAM: CT ABDOMEN AND PELVIS WITH CONTRAST TECHNIQUE: Multidetector CT imaging of the abdomen and pelvis was performed using the standard protocol following bolus administration of intravenous contrast. CONTRAST:  183mL OMNIPAQUE IOHEXOL 300 MG/ML  SOLN COMPARISON:  None available. FINDINGS: Lower chest: Linear and bandlike opacities within both lower lobes and right middle lobe typical of atelectasis. There is no pleural fluid. Heart is normal in size. Hepatobiliary: Heterogeneous and partially exophytic lesion arising from the left hepatic lobe measures 6.8 x 6.4 cm. There may be a central low-density scar. There is a 2.6 cm heterogeneous lesion in the left lobe, series 2, image 36. Mild background hepatic steatosis. Calcified gallstone in the gallbladder without pericholecystic inflammation. Pancreas: Parenchymal atrophy. No ductal dilatation or inflammation. Spleen: Subcentimeter hypodensity centrally.  Normal in size. Adrenals/Urinary Tract: Normal adrenal glands. No hydronephrosis or perinephric edema. Homogeneous renal enhancement with symmetric excretion on delayed phase imaging. Punctate nonobstructing stone in the mid lower right kidney. Minimal cortical  scarring of the right upper pole. Urinary bladder is physiologically distended without wall thickening. Stomach/Bowel: There is free air and free fluid in the abdomen consistent with bowel perforation. Enhancement and wall thickening about the distal stomach with adjacent free fluid may represent site of perforation, series 2, image 40. Free air appears primarily in the upper rather than lower abdomen. There are scattered fluid-filled loops of small bowel without obstruction, favoring ileus. Colon appears tortuous and is difficult to accurately delineate. There is no obvious colonic wall thickening or inflammation. Appendix is tentatively visualized. There is no appendicitis. Vascular/Lymphatic: Aorto bi-iliac atherosclerosis. Patent portal vein. There is dilatation of the gonadal veins with reflux of contrast, left greater than right as well as prominent periuterine vascularity. Left ovarian vein measures 9 mm. Limited assessment for adenopathy. No bulky enlarged lymph nodes. Reproductive: Prominent periuterine and adnexal vascularity with dilatation of the ovarian veins. There is no adnexal mass. Other: Scattered free air and free fluid in the upper abdomen, most prominent in the right upper quadrant. There is no focal fluid collection or abscess. Free fluid tracks into the lesser sac and upper small bowel mesentery. Postsurgical change of the anterior abdominal wall. Musculoskeletal: Partial fusion of T10-T11 may be congenital or degenerative. There are scattered small sclerotic foci in the pelvis are nonspecific. No acute fracture IMPRESSION: 1. Free air and free fluid in the abdomen consistent with bowel perforation. Enhancement and wall thickening about the distal stomach with adjacent free fluid is likely site of perforation, possible gastric ulcer. Inflammatory changes appear to be centered in the upper abdomen suggesting upper GI source. 2. Liver lesions, largest measuring 6.8 cm. Recommend correlation with  any prior imaging if available to assess for stability. In the absence of prior imaging, recommend hepatic protocol MRI for further characterization after resolution of acute event. 3. Prominent  periuterine and adnexal vascularity with dilatation of the ovarian veins, can be seen with pelvic congestion syndrome. 4. Fall stone.  Nonobstructing right renal stone. 5. Scattered small sclerotic foci in the pelvis are nonspecific. Aortic Atherosclerosis (ICD10-I70.0). Critical Value/emergent results were called by telephone at the time of interpretation on 08/09/2020 at 2:45 am to provider Lattie Haw , who verbally acknowledged these results. Electronically Signed   By: Keith Rake M.D.   On: 08/09/2020 02:45    Medications / Allergies: per chart  Antibiotics: Anti-infectives (From admission, onward)    Start     Dose/Rate Route Frequency Ordered Stop   08/09/20 0300  piperacillin-tazobactam (ZOSYN) IVPB 3.375 g        3.375 g 100 mL/hr over 30 Minutes Intravenous  Once 08/09/20 0253           Note: Portions of this report may have been transcribed using voice recognition software. Every effort was made to ensure accuracy; however, inadvertent computerized transcription errors may be present.   Any transcriptional errors that result from this process are unintentional.    Adin Hector, MD, FACS, MASCRS Esophageal, Gastrointestinal & Colorectal Surgery Robotic and Minimally Invasive Surgery  [facility] [address] [place], [place] [address] [phone] Fax [phone] Main/Paging  CONTACT INFORMATION: Weekday (9AM-5PM) concerns: Call [facility] main office at [phone] Weeknight (5PM-9AM) or Weekend/Holiday concerns: Check [email or web address] for General Surgery [facility] coverage (Please, do not use SecureChat as it is not reliable communication to operating surgeons for immediate patient care)      08/09/2020  3:47 AM

## 2020-08-09 NOTE — Anesthesia Procedure Notes (Signed)
Procedure Name: Intubation Date/Time: 08/09/2020 6:24 AM Performed by: Raenette Rover, CRNA Pre-anesthesia Checklist: Patient identified, Suction available, Patient being monitored and Emergency Drugs available Patient Re-evaluated:Patient Re-evaluated prior to induction Oxygen Delivery Method: Circle system utilized Preoxygenation: Pre-oxygenation with 100% oxygen Induction Type: IV induction Ventilation: Mask ventilation without difficulty Laryngoscope Size: Mac and 3 Grade View: Grade I Tube type: Oral Tube size: 7.0 mm Number of attempts: 1 Airway Equipment and Method: Stylet Placement Confirmation: ETT inserted through vocal cords under direct vision, positive ETCO2 and breath sounds checked- equal and bilateral Secured at: 21 cm Tube secured with: Tape Dental Injury: Teeth and Oropharynx as per pre-operative assessment

## 2020-08-09 NOTE — Transfer of Care (Signed)
Immediate Anesthesia Transfer of Care Note  Patient: [name] [name]  Procedure(s) Performed: LAPAROSCOPIC EXPLORATION OF ABDOMEN, PERFORATED ULCER REPAIR, LIVER BIOPSY (Abdomen)  Patient Location: PACU  Anesthesia Type:General  Level of Consciousness: awake, alert , oriented and patient cooperative  Airway & Oxygen Therapy: Patient Spontanous Breathing and Patient connected to face mask oxygen  Post-op Assessment: Report given to RN and Post -op Vital signs reviewed and stable  Post vital signs: Reviewed and stable  Last Vitals:  Vitals Value Taken Time  BP 156/70 08/09/20 0833  Temp    Pulse 98 08/09/20 0836  Resp 23 08/09/20 0836  SpO2 100 % 08/09/20 0836  Vitals shown include unvalidated device data.  Last Pain:  Vitals:   08/08/20 2223  TempSrc:   PainSc: 10-Worst pain ever         Complications: No notable events documented.

## 2020-08-09 NOTE — ED Notes (Signed)
Pt transported to PACU 

## 2020-08-09 NOTE — Anesthesia Preprocedure Evaluation (Addendum)
Anesthesia Evaluation  Patient identified by MRN, date of birth, ID band Patient awake    Reviewed: Allergy & Precautions, NPO status , Patient's Chart, lab work & pertinent test results  Airway Mallampati: III  TM Distance: >3 FB Neck ROM: Full    Dental  (+) Teeth Intact, Dental Advisory Given   Pulmonary neg pulmonary ROS,    breath sounds clear to auscultation       Cardiovascular hypertension,  Rhythm:Regular Rate:Tachycardia     Neuro/Psych negative neurological ROS  negative psych ROS   GI/Hepatic negative GI ROS, Neg liver ROS,   Endo/Other  diabetes  Renal/GU negative Renal ROS     Musculoskeletal   Abdominal Normal abdominal exam  (+)   Peds  Hematology   Anesthesia Other Findings   Reproductive/Obstetrics                            Anesthesia Physical Anesthesia Plan  ASA: 2 and emergent  Anesthesia Plan: General   Post-op Pain Management:    Induction: Intravenous  PONV Risk Score and Plan: 4 or greater and Ondansetron and Treatment may vary due to age or medical condition  Airway Management Planned: Oral ETT  Additional Equipment: None  Intra-op Plan:   Post-operative Plan: Extubation in OR  Informed Consent: I have reviewed the patients History and Physical, chart, labs and discussed the procedure including the risks, benefits and alternatives for the proposed anesthesia with the patient or authorized representative who has indicated his/her understanding and acceptance.     Dental advisory given  Plan Discussed with: CRNA  Anesthesia Plan Comments:        Anesthesia Quick Evaluation
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
## 2020-08-09 NOTE — Consult Note (Signed)
Medical Consultation  [name] [name] [place] PRF:[id number] DOB: October 13, 1945 DOA: 08/08/2020 PCP: Merryl Hacker, No   Requesting physician: Dr. Johney Maine Date of consultation: 08/09/20 Reason for consultation: Mgmt of DM, HTN, glaucoma  Impression/Recommendations DM2     - for now, will continue with SSI, glucose checks     - A1c is 5.8     - when surgery clears her to resume diet, will carb modified  HTN     - BP PRNs ordered by surgery team, continue for now     - will resume home regimen when she is cleared to take PO again  Glaucoma     - resume home eye drops  Hyponatremia     - mild; fluids, monitor (AM labs ordered)  Social     - just moved from [place]; needs PCP; will consult TOC for PCP help  Perforated stomach ulcer Pneumoperitoneum     - per primary team  Remainder per primary.  CODE - FULL  [facility] will follow-up again tomorrow. Please contact me if I can be of assistance in the meanwhile. Thank you for this consultation.  Chief Complaint: abdominal pain  HPI:  [name] [name] is a 75 y.o. female with medical history significant of DM, gluacoma, HTN, colon CA. Presenting with abdominal pain. Symptoms started 3 days ago. Sharp pain in the upper quadrants. In severe waves and worsened with vomiting. She felt increasingly weak and dehydrated since this all began. Her symptoms worsened to the point that she decided to come to the ED yesterday for help. During her workup, it was noted that she had free air in the abdomen and the apparent site was a possible gastric ulcer. It was also noted that she had several hepatic lesions. Surgery was consulted. She was taken to the OR for a perforation repair. She has successfully completed that procedure. TRH was consulted for management of her chronic conditions.   Review of Systems:  ROS is negative for all not mentioned in HPI.  Past Medical History:  Diagnosis Date   Diabetes mellitus without complication ([place])    Glaucoma     Hypertension    Past Surgical History:  Procedure Laterality Date   COLON RESECTION  2005   In [place] - ?colon cancer   Social History:  has no history on file for tobacco use, alcohol use, and drug use.  Allergies  Allergen Reactions   Bactrim [Sulfamethoxazole-Trimethoprim] Swelling   History reviewed. No pertinent family history.  Prior to Admission medications   Not on File   Physical Exam: Blood pressure (!) 149/70, pulse 95, temperature 97.9 F (36.6 C), resp. rate (!) 25, SpO2 97 %. Vitals:   08/09/20 0910 08/09/20 0915  BP:  (!) 149/70  Pulse: 98 95  Resp: 20 (!) 25  Temp:  97.9 F (36.6 C)  SpO2: 98% 97%    General: 75 y.o. female resting in bed in NAD Eyes: PERRL, normal sclera ENMT: Nares patent w/o discharge, orophaynx clear, dentition normal, ears w/o discharge/lesions/ulcers Neck: Supple, trachea midline Cardiovascular: RRR, +S1, S2, no m/g/r, equal pulses throughout Respiratory: CTABL, no w/r/r, normal WOB GI: BS+, ND, globally appropriately tender no masses noted, no organomegaly noted MSK: No e/c/c Skin: No rashes, bruises, ulcerations noted Neuro: A&O x 3, no focal deficits Psyc: Appropriate interaction and affect, calm/cooperative  Labs on Admission:  Basic Metabolic Panel: Recent Labs  Lab 08/08/20 2224  NA 132*  K 3.2*  CL 98  CO2 24  GLUCOSE 118*  BUN  22  CREATININE 0.71  CALCIUM 10.2   Liver Function Tests: Recent Labs  Lab 08/08/20 2224  AST 43*  ALT 63*  ALKPHOS 157*  BILITOT 1.1  PROT 7.9  ALBUMIN 3.9   Recent Labs  Lab 08/08/20 2224  LIPASE 46   No results for input(s): AMMONIA in the last 168 hours. CBC: Recent Labs  Lab 08/08/20 2224  WBC 9.7  NEUTROABS 8.6*  HGB 15.6*  HCT 46.3*  MCV 91.5  PLT 248   Cardiac Enzymes: No results for input(s): CKTOTAL, CKMB, CKMBINDEX, TROPONINI in the last 168 hours. BNP: Invalid input(s): POCBNP CBG: Recent Labs  Lab 08/09/20 0841  GLUCAP 149*     Radiological Exams on Admission: CT ABDOMEN PELVIS W CONTRAST  Result Date: 08/09/2020 CLINICAL DATA:  Abdominal distension with nausea and vomiting. History of colon cancer. EXAM: CT ABDOMEN AND PELVIS WITH CONTRAST TECHNIQUE: Multidetector CT imaging of the abdomen and pelvis was performed using the standard protocol following bolus administration of intravenous contrast. CONTRAST:  124mL OMNIPAQUE IOHEXOL 300 MG/ML  SOLN COMPARISON:  None available. FINDINGS: Lower chest: Linear and bandlike opacities within both lower lobes and right middle lobe typical of atelectasis. There is no pleural fluid. Heart is normal in size. Hepatobiliary: Heterogeneous and partially exophytic lesion arising from the left hepatic lobe measures 6.8 x 6.4 cm. There may be a central low-density scar. There is a 2.6 cm heterogeneous lesion in the left lobe, series 2, image 36. Mild background hepatic steatosis. Calcified gallstone in the gallbladder without pericholecystic inflammation. Pancreas: Parenchymal atrophy. No ductal dilatation or inflammation. Spleen: Subcentimeter hypodensity centrally.  Normal in size. Adrenals/Urinary Tract: Normal adrenal glands. No hydronephrosis or perinephric edema. Homogeneous renal enhancement with symmetric excretion on delayed phase imaging. Punctate nonobstructing stone in the mid lower right kidney. Minimal cortical scarring of the right upper pole. Urinary bladder is physiologically distended without wall thickening. Stomach/Bowel: There is free air and free fluid in the abdomen consistent with bowel perforation. Enhancement and wall thickening about the distal stomach with adjacent free fluid may represent site of perforation, series 2, image 40. Free air appears primarily in the upper rather than lower abdomen. There are scattered fluid-filled loops of small bowel without obstruction, favoring ileus. Colon appears tortuous and is difficult to accurately delineate. There is no obvious  colonic wall thickening or inflammation. Appendix is tentatively visualized. There is no appendicitis. Vascular/Lymphatic: Aorto bi-iliac atherosclerosis. Patent portal vein. There is dilatation of the gonadal veins with reflux of contrast, left greater than right as well as prominent periuterine vascularity. Left ovarian vein measures 9 mm. Limited assessment for adenopathy. No bulky enlarged lymph nodes. Reproductive: Prominent periuterine and adnexal vascularity with dilatation of the ovarian veins. There is no adnexal mass. Other: Scattered free air and free fluid in the upper abdomen, most prominent in the right upper quadrant. There is no focal fluid collection or abscess. Free fluid tracks into the lesser sac and upper small bowel mesentery. Postsurgical change of the anterior abdominal wall. Musculoskeletal: Partial fusion of T10-T11 may be congenital or degenerative. There are scattered small sclerotic foci in the pelvis are nonspecific. No acute fracture IMPRESSION: 1. Free air and free fluid in the abdomen consistent with bowel perforation. Enhancement and wall thickening about the distal stomach with adjacent free fluid is likely site of perforation, possible gastric ulcer. Inflammatory changes appear to be centered in the upper abdomen suggesting upper GI source. 2. Liver lesions, largest measuring 6.8 cm. Recommend correlation with any  prior imaging if available to assess for stability. In the absence of prior imaging, recommend hepatic protocol MRI for further characterization after resolution of acute event. 3. Prominent periuterine and adnexal vascularity with dilatation of the ovarian veins, can be seen with pelvic congestion syndrome. 4. Fall stone.  Nonobstructing right renal stone. 5. Scattered small sclerotic foci in the pelvis are nonspecific. Aortic Atherosclerosis (ICD10-I70.0). Critical Value/emergent results were called by telephone at the time of interpretation on 08/09/2020 at 2:45 am to  provider Lattie Haw , who verbally acknowledged these results. Electronically Signed   By: Keith Rake M.D.   On: 08/09/2020 02:45    EKG: None obtained in ED.  Time spent: 45 minutes  [facility]  If 7PM-7AM, please contact night-coverage [email or web address] 08/09/2020, 9:49 AM

## 2020-08-09 NOTE — Interval H&P Note (Signed)
History and Physical Interval Note:  08/09/2020 5:21 AM  [place]  has presented today for surgery, with the diagnosis of PERFORATED STOMACH ULCER.  The various methods of treatment have been discussed with the patient and family. After consideration of risks, benefits and other options for treatment, the patient has consented to  Procedure(s): ABDOMINAL EXPLORATION, PROBABLE REPAIR OF PERFORATED GASTRIC ULCER (N/A) as a surgical intervention.  The patient's history has been reviewed, patient examined, no change in status, stable for surgery.  I have reviewed the patient's chart and labs.  Questions were answered to the patient's satisfaction.     Adin Hector

## 2020-08-09 NOTE — Op Note (Addendum)
08/09/2020  8:21 AM  PATIENT:  [name] [name]  75 y.o. female  Patient Care Team: Pcp, No as PCP - Huston Foley, MD as Consulting Physician (General Surgery)  PRE-OPERATIVE DIAGNOSIS:   PNEUMOPERITONEUM, PROBABLE PERFORATED STOMACH ULCER LIVER MASSES HISTORY OF COLON CANCER  POST-OPERATIVE DIAGNOSIS:   PERFORATED STOMACH ULCER LIVER MASSES HISTORY OF COLON CANCER  PROCEDURE:   OMENTAL GRAHAM PATCH OF PERFORATED ULCER CORE LIVER BIOPSIES DIAGNOSTIC LAPAROSCOPY LAPAROSCOPIC LYSIS OF ADHESIONS  SURGEON:  Adin Hector, MD  ASSISTANT: OR Staff   ANESTHESIA:   local and general  EBL: 142mL   Total I/O In: 250 [IV Piggyback:250] Out: 37 [Urine:550]  Delay start of Pharmacological VTE agent (>24hrs) due to surgical blood loss or risk of bleeding:  no  DRAINS: none   SPECIMEN: Core biopsies of hepatic liver lesions.  DISPOSITION OF SPECIMEN:  PATHOLOGY  COUNTS:  YES  PLAN OF CARE: Admit to inpatient   PATIENT DISPOSITION:  PACU - guarded condition.  INDICATION:   Patient with evidence of free air and perforation.  Suspicion of perforated ulcer.  Originally from [place].  Prior colon resection I believe for cancer.  Has liver lesions especially left-sided multiple.  Concerning for possible metastatic disease.  I recommended surgery:  The anatomy & physiology of the digestive tract was discussed.  The pathophysiology of perforation was discussed.  Differential diagnosis such as perforated ulcer or colon, etc was discussed.   Natural history risks without surgery such as death was discussed.  I recommended abdominal exploration to diagnose & treat the source of the problem.  Laparoscopic & open techniques were discussed.   Risks such as bleeding, infection, abscess, leak, reoperation, bowel resection, possible ostomy, hernia, heart attack, death, and other risks were discussed.   The risks of no intervention will lead to serious problems  including death.   I expressed a good likelihood that surgery will address the problem.    Goals of post-operative recovery were discussed as well.  We will work to minimize complications although risks in an emergent setting are high.   Questions were answered.  The patient expressed understanding & wishes to proceed with surgery.      OR FINDINGS: Perforation of the distal antrum closer to the lesser curvature near the pylorus.  Omental Graham patch done.  Numerous spherical firm lesions in the left hepatic lobe.  Largest 1 in the left lateral sector.  Core biopsies done to rule out metastatic cancer.   CASE DATA:  Type of patient?: LDOW CASE (Surgical Hospitalist [facility] Inpatient)  Status of Case? EMERGENT Add On  Infection Present At Time Of Surgery (PATOS)?  PHLEGMON & PURULENCE  DESCRIPTION:   Informed consent was confirmed.  The patient underwent general anaesthesia without difficulty.  The patient was positioned appropriately.  VTE prevention in place.  The patient's abdomen was clipped, prepped, & draped in a sterile fashion.  Surgical timeout confirmed our plan.  The patient was positioned in reverse Trendelenburg.  Abdominal entry with a 49mm port was gained using optical entry technique in the left upper abdomen.  Entry was clean.  I induced carbon dioxide insufflation.  Camera inspection revealed no injury.  Extra ports were carefully placed under direct laparoscopic visualization.    Upon entering the abdomen (organ space), I encountered purulent ascites.  Peritonitis could be seen focused in the upper abdomen.  Patient had moderately dense adhesion of omentum and transverse colon the anterior abdominal wall.  These were sharply freed off  with laparoscopic scissors as well as blunt Hydro dissection.  Eventually freed off omentum and transverse colon off to see the stomach going up and adhering to the left lateral sector and falciform ligament.  Eventually freed off of the stomach and  antrum to find a pocket along the lesser curvature with phlegmon and pus and purulence.  Obvious distal antral perforation close to the pylorus on the lesser curvature noted with bile spilling out.  Consistent with perforated ulcer.  About 6 mm in size.  Did irrigation over 3 L.   Had anesthesia try to Paes and as gastric proved.  Had to use a 37 [place] but eventually got it to come down along the greater curvature with the tip in the antrum  I mobilized a tongue of greater omentum.  I laid it over the perforated ulcer.  I proceeded to do an omental patch by placing 2-0 V-lock horizontal mattress stitches on lesser curvature and greater curvature side of the ulcer including a bite of omentum in between.  I then tied down the stitches to help tack the omentum as a bulky plug of the perforation.  This is the classic Phillip Heal omental patch.  Ran the V-lock suture a few more stitches to ensure good snug and confirmed a good patch of the ulcer with no leak.  We did copious irrigation and reinspection of the abdominal cavity.  Did a total of 5 L of irrigation.  Freed the left lateral sector of the liver off the anterior abdominal wall and used 14-gauge Tru-Cut core needle biopsies through to the more obvious lesions.  Try to go through normal liver parenchyma.  Seem to have necrosis within the lesions themselves.  Did not seem consistent with hemangiomas or cysts.  I assured hemostasis on the liver and elsewhere.  We then evacuated carbon dioxide to remove the ports.  I closed the ports using Monocryl stitch a sterile dressings.  I discussed operative findings, updated the patient's status, discussed probable steps to recovery, and gave postoperative recommendations to the patient's daughter, [name] .  Recommendations were made.  Questions were answered.  She expressed understanding & appreciation.   Adin Hector, M.D., F.A.C.S. Gastrointestinal and Minimally Invasive Surgery [facility] [address] [place], [place] [address] [phone] Main / Paging

## 2020-08-10 ENCOUNTER — Encounter (HOSPITAL_COMMUNITY): Payer: Self-pay | Admitting: Surgery

## 2020-08-10 DIAGNOSIS — R7989 Other specified abnormal findings of blood chemistry: Secondary | ICD-10-CM

## 2020-08-10 DIAGNOSIS — R739 Hyperglycemia, unspecified: Secondary | ICD-10-CM

## 2020-08-10 DIAGNOSIS — E876 Hypokalemia: Secondary | ICD-10-CM

## 2020-08-10 DIAGNOSIS — K251 Acute gastric ulcer with perforation: Secondary | ICD-10-CM

## 2020-08-10 DIAGNOSIS — K668 Other specified disorders of peritoneum: Secondary | ICD-10-CM

## 2020-08-10 DIAGNOSIS — Z85048 Personal history of other malignant neoplasm of rectum, rectosigmoid junction, and anus: Secondary | ICD-10-CM

## 2020-08-10 DIAGNOSIS — R160 Hepatomegaly, not elsewhere classified: Secondary | ICD-10-CM

## 2020-08-10 LAB — HEPATIC FUNCTION PANEL
ALT: 87 U/L — ABNORMAL HIGH (ref 0–44)
AST: 90 U/L — ABNORMAL HIGH (ref 15–41)
Albumin: 2.6 g/dL — ABNORMAL LOW (ref 3.5–5.0)
Alkaline Phosphatase: 70 U/L (ref 38–126)
Bilirubin, Direct: 0.3 mg/dL — ABNORMAL HIGH (ref 0.0–0.2)
Indirect Bilirubin: 0.3 mg/dL (ref 0.3–0.9)
Total Bilirubin: 0.6 mg/dL (ref 0.3–1.2)
Total Protein: 5.3 g/dL — ABNORMAL LOW (ref 6.5–8.1)

## 2020-08-10 LAB — GLUCOSE, CAPILLARY
Glucose-Capillary: 140 mg/dL — ABNORMAL HIGH (ref 70–99)
Glucose-Capillary: 142 mg/dL — ABNORMAL HIGH (ref 70–99)
Glucose-Capillary: 154 mg/dL — ABNORMAL HIGH (ref 70–99)
Glucose-Capillary: 183 mg/dL — ABNORMAL HIGH (ref 70–99)
Glucose-Capillary: 213 mg/dL — ABNORMAL HIGH (ref 70–99)
Glucose-Capillary: 285 mg/dL — ABNORMAL HIGH (ref 70–99)

## 2020-08-10 LAB — BASIC METABOLIC PANEL
Anion gap: 6 (ref 5–15)
BUN: 38 mg/dL — ABNORMAL HIGH (ref 8–23)
CO2: 24 mmol/L (ref 22–32)
Calcium: 8.3 mg/dL — ABNORMAL LOW (ref 8.9–10.3)
Chloride: 105 mmol/L (ref 98–111)
Creatinine, Ser: 1.5 mg/dL — ABNORMAL HIGH (ref 0.44–1.00)
GFR, Estimated: 36 mL/min — ABNORMAL LOW (ref >60)
Glucose, Bld: 212 mg/dL — ABNORMAL HIGH (ref 70–99)
Potassium: 3.7 mmol/L (ref 3.5–5.1)
Sodium: 135 mmol/L (ref 135–145)

## 2020-08-10 LAB — RENAL FUNCTION PANEL
Albumin: 2.6 g/dL — ABNORMAL LOW (ref 3.5–5.0)
Anion gap: 8 (ref 5–15)
BUN: 37 mg/dL — ABNORMAL HIGH (ref 8–23)
CO2: 25 mmol/L (ref 22–32)
Calcium: 8.5 mg/dL — ABNORMAL LOW (ref 8.9–10.3)
Chloride: 104 mmol/L (ref 98–111)
Creatinine, Ser: 1.41 mg/dL — ABNORMAL HIGH (ref 0.44–1.00)
GFR, Estimated: 39 mL/min — ABNORMAL LOW (ref >60)
Glucose, Bld: 213 mg/dL — ABNORMAL HIGH (ref 70–99)
Phosphorus: 2.5 mg/dL (ref 2.5–4.6)
Potassium: 3.8 mmol/L (ref 3.5–5.1)
Sodium: 137 mmol/L (ref 135–145)

## 2020-08-10 LAB — CBC WITH DIFFERENTIAL/PLATELET
Abs Immature Granulocytes: 0.04 10*3/uL (ref 0.00–0.07)
Basophils Absolute: 0 10*3/uL (ref 0.0–0.1)
Basophils Relative: 0 %
Eosinophils Absolute: 0 10*3/uL (ref 0.0–0.5)
Eosinophils Relative: 0 %
HCT: 22.2 % — ABNORMAL LOW (ref 36.0–46.0)
Hemoglobin: 7.6 g/dL — ABNORMAL LOW (ref 12.0–15.0)
Immature Granulocytes: 0 %
Lymphocytes Relative: 18 %
Lymphs Abs: 1.7 10*3/uL (ref 0.7–4.0)
MCH: 31.4 pg (ref 26.0–34.0)
MCHC: 34.2 g/dL (ref 30.0–36.0)
MCV: 91.7 fL (ref 80.0–100.0)
Monocytes Absolute: 1 10*3/uL (ref 0.1–1.0)
Monocytes Relative: 10 %
Neutro Abs: 6.6 10*3/uL (ref 1.7–7.7)
Neutrophils Relative %: 72 %
Platelets: 189 10*3/uL (ref 150–400)
RBC: 2.42 MIL/uL — ABNORMAL LOW (ref 3.87–5.11)
RDW: 14.1 % (ref 11.5–15.5)
WBC: 9.3 10*3/uL (ref 4.0–10.5)
nRBC: 0 % (ref 0.0–0.2)

## 2020-08-10 LAB — PHOSPHORUS: Phosphorus: 2.5 mg/dL (ref 2.5–4.6)

## 2020-08-10 LAB — MAGNESIUM: Magnesium: 1.6 mg/dL — ABNORMAL LOW (ref 1.7–2.4)

## 2020-08-10 MED ORDER — METOPROLOL TARTRATE 5 MG/5ML IV SOLN
2.5000 mg | Freq: Three times a day (TID) | INTRAVENOUS | Status: AC
  Administered 2020-08-10 – 2020-08-11 (×3): 2.5 mg via INTRAVENOUS
  Filled 2020-08-10 (×3): qty 5

## 2020-08-10 MED ORDER — MAGNESIUM SULFATE 4 GM/100ML IV SOLN
4.0000 g | Freq: Once | INTRAVENOUS | Status: AC
  Administered 2020-08-10: 4 g via INTRAVENOUS
  Filled 2020-08-10: qty 100

## 2020-08-10 MED ORDER — SODIUM CHLORIDE 0.9 % IV SOLN
INTRAVENOUS | Status: DC | PRN
  Administered 2020-08-10: 500 mL via INTRAVENOUS

## 2020-08-10 MED ORDER — HYDROMORPHONE HCL 1 MG/ML IJ SOLN
0.5000 mg | INTRAMUSCULAR | Status: DC | PRN
  Administered 2020-08-10 – 2020-08-12 (×4): 1 mg via INTRAVENOUS
  Filled 2020-08-10 (×4): qty 1

## 2020-08-10 MED ORDER — ENOXAPARIN SODIUM 30 MG/0.3ML IJ SOSY
30.0000 mg | PREFILLED_SYRINGE | INTRAMUSCULAR | Status: DC
  Filled 2020-08-10: qty 0.3

## 2020-08-10 NOTE — Progress Notes (Signed)
PROGRESS NOTE    [name] [name] [place]  SHF:[id number] DOB: 1945-10-08 DOA: 08/08/2020 PCP: Pcp, No    Chief Complaint  Patient presents with   Abdominal Pain    Brief Narrative:  H/o diabetes, hypertension, recently relocated from [place] presented to the ED due to abdominal pain CT scan done shows gas in the upper abdomen with some inflammation at the pylorus/duodenal region suspicion for perforated ulcer.  Also moderate mass of the left lateral sector of the liver.  No obstruction.  Admitted to general surgery, operated on 6/25 Hospitalist service consulted for postop medical management  Subjective:  POD#1 She is on NG suction, currently denies pain, reports passing gas She is  slightly tachycardic , blood pressure stable  Assessment & Plan:   Principal Problem:   Pneumoperitoneum Active Problems:   History of colorectal cancer   Liver mass, left lobe   Insulin-requiring or dependent type II diabetes mellitus ([place])   HTN (hypertension)   Constipation, chronic   Perforated gastric ulcer ([place])   Perforated ulcer -s/p lap omental Phillip Heal patch 08/09/2020 -On IV PPI, IV antibiotics, IV hydration -Currently n.p.o., NG suction -Management per general surgery  Liver mass, with history of colon cancer Does has elevated LFTs Status post core biopsy Pathology pending We will check hepatitis panel  Postop anemia -Hemoglobin 7.6 this morning -Repeat CBC in the morning -Transfuse if hemoglobin less than 7  Hyponatremia/hypokalemia/hypomagnesemia Replace, monitor  AKI on CKDII -Creatinine went from 0.7-1.5 -UA with moderate leukocyte, rare bacteria, urine culture was not obtained, she is already on antibiotics -Continue hydration -Monitor BMP, renal dosing meds, hold home medication Cozaar/HCTZ  Diabetes A1c 5.8 She is n.p.o. On SSI  Hypertension On nifedipine, Cozaar/HCTZ at home -Currently n.p.o., oral meds held -She has sinus tachycardia, start low  dose Lopressor IV with holding parameters   Glaucoma Continue home eyedrops   The patient's BMI is: Body mass index is 24.6 kg/m.[name]     Unresulted Labs (From admission, onward)     Start     Ordered   08/16/20 0500  Creatinine, serum  (enoxaparin (LOVENOX)    CrCl >/= 30 ml/min)  Weekly,   R     Comments: while on enoxaparin therapy   Question:  Specimen collection method  Answer:  IV Team   08/09/20 1148   08/10/20 0500  CBC  Tomorrow morning,   R       Question:  Specimen collection method  Answer:  IV Team   08/09/20 1148   08/10/20 0500  H Pylori, IGM, IGG, IGA AB  Tomorrow morning,   R        08/09/20 1148   08/10/20 0500  CBC with Differential/Platelet  Tomorrow morning,   R        08/09/20 1108              DVT prophylaxis: enoxaparin (LOVENOX) injection 40 mg Start: 08/10/20 1000 SCD's Start: 08/09/20 1149 SCDs Start: 08/09/20 0842   Code Status:full Family Communication: patient Disposition:   Status is: Inpatient   Dispo: The patient is from: home              Anticipated d/c is to: home              Anticipated d/c date is: TBD, per general surgery         Consultants:  General surgery primary [facility] as medical consultant   Procedures:  OMENTAL GRAHAM PATCH OF PERFORATED ULCER CORE LIVER BIOPSIES DIAGNOSTIC  LAPAROSCOPY LAPAROSCOPIC LYSIS OF ADHESIONS  Antimicrobials:    Anti-infectives (From admission, onward)    Start     Dose/Rate Route Frequency Ordered Stop   08/09/20 1000  piperacillin-tazobactam (ZOSYN) IVPB 3.375 g        3.375 g 12.5 mL/hr over 240 Minutes Intravenous Every 8 hours 08/09/20 0407     08/09/20 0600  cefoTEtan (CEFOTAN) 2 g in sodium chloride 0.9 % 100 mL IVPB        2 g 200 mL/hr over 30 Minutes Intravenous On call to O.R. 08/09/20 0418 08/09/20 0659   08/09/20 0300  piperacillin-tazobactam (ZOSYN) IVPB 3.375 g        3.375 g 100 mL/hr over 30 Minutes Intravenous  Once 08/09/20 0253 08/09/20 0410           Objective: Vitals:   08/10/20 0231 08/10/20 0408 08/10/20 0408 08/10/20 0924  BP: (!) 111/54 (!) 112/57 (!) 112/57 127/60  Pulse: (!) 118 (!) 118 (!) 115 (!) 115  Resp: 18 16 16 20   Temp: 99.8 F (37.7 C) 99.8 F (37.7 C) 99.8 F (37.7 C) 98.9 F (37.2 C)  TempSrc: Oral Oral Oral Oral  SpO2: 90% 100% 100% 98%  Weight:      Height:        Intake/Output Summary (Last 24 hours) at 08/10/2020 1130 Last data filed at 08/10/2020 1000 Gross per 24 hour  Intake 4374.89 ml  Output 700 ml  Net 3674.89 ml   Filed Weights   08/09/20 1500  Weight: 65 kg    Examination:  General exam: calm, NAD Respiratory system: Clear to auscultation. Respiratory effort normal. Cardiovascular system: slight sinus tachycardia Gastrointestinal system: mild distention, old midline surgical scar , surgical strips C/D/I, + bowel sounds heard. Central nervous system: Alert and oriented. No focal neurological deficits. Extremities: Symmetric 5 x 5 power. Skin: No rashes, lesions or ulcers Psychiatry: Judgement and insight appear normal. Mood & affect appropriate.     Data Reviewed: I have personally reviewed following labs and imaging studies  CBC: Recent Labs  Lab 08/08/20 2224 08/10/20 0548  WBC 9.7 9.3  NEUTROABS 8.6* 6.6  HGB 15.6* 7.6*  HCT 46.3* 22.2*  MCV 91.5 91.7  PLT 248 009    Basic Metabolic Panel: Recent Labs  Lab 08/08/20 2224 08/10/20 0413  NA 132* 137  135  K 3.2* 3.8  3.7  CL 98 104  105  CO2 24 25  24   GLUCOSE 118* 213*  212*  BUN 22 37*  38*  CREATININE 0.71 1.41*  1.50*  CALCIUM 10.2 8.5*  8.3*  MG  --  1.6*  PHOS  --  2.5  2.5    GFR: Estimated Creatinine Clearance: 28.4 mL/min (A) (by C-G formula based on SCr of 1.5 mg/dL (H)).  Liver Function Tests: Recent Labs  Lab 08/08/20 2224 08/10/20 0413  AST 43* 90*  ALT 63* 87*  ALKPHOS 157* 70  BILITOT 1.1 0.6  PROT 7.9 5.3*  ALBUMIN 3.9 2.6*  2.6*    CBG: Recent Labs  Lab  08/09/20 1736 08/09/20 2248 08/10/20 0020 08/10/20 0405 08/10/20 0804  GLUCAP 296* 279* 285* 213* 154*     Recent Results (from the past 240 hour(s))  Resp Panel by RT-PCR (Flu A&B, Covid) Nasopharyngeal Swab     Status: None   Collection Time: 08/08/20 11:35 PM   Specimen: Nasopharyngeal Swab; Nasopharyngeal(NP) swabs in vial transport medium  Result Value Ref Range Status   SARS Coronavirus 2 by  RT PCR NEGATIVE NEGATIVE Final    Comment: (NOTE) SARS-CoV-2 target nucleic acids are NOT DETECTED.  The SARS-CoV-2 RNA is generally detectable in upper respiratory specimens during the acute phase of infection. The lowest concentration of SARS-CoV-2 viral copies this assay can detect is 138 copies/mL. A negative result does not preclude SARS-Cov-2 infection and should not be used as the sole basis for treatment or other patient management decisions. A negative result may occur with  improper specimen collection/handling, submission of specimen other than nasopharyngeal swab, presence of viral mutation(s) within the areas targeted by this assay, and inadequate number of viral copies(<138 copies/mL). A negative result must be combined with clinical observations, patient history, and epidemiological information. The expected result is Negative.  Fact Sheet for Patients:  EntrepreneurPulse.com.au  Fact Sheet for Healthcare Providers:  IncredibleEmployment.be  This test is no t yet approved or cleared by the [place] FDA and  has been authorized for detection and/or diagnosis of SARS-CoV-2 by FDA under an Emergency Use Authorization (EUA). This EUA will remain  in effect (meaning this test can be used) for the duration of the COVID-19 declaration under Section 564(b)(1) of the Act, 21 U.S.C.section 360bbb-3(b)(1), unless the authorization is terminated  or revoked sooner.       Influenza A by PCR NEGATIVE NEGATIVE Final   Influenza B by  PCR NEGATIVE NEGATIVE Final    Comment: (NOTE) The Xpert Xpress SARS-CoV-2/FLU/RSV plus assay is intended as an aid in the diagnosis of influenza from Nasopharyngeal swab specimens and should not be used as a sole basis for treatment. Nasal washings and aspirates are unacceptable for Xpert Xpress SARS-CoV-2/FLU/RSV testing.  Fact Sheet for Patients: EntrepreneurPulse.com.au  Fact Sheet for Healthcare Providers: IncredibleEmployment.be  This test is not yet approved or cleared by the [place] FDA and has been authorized for detection and/or diagnosis of SARS-CoV-2 by FDA under an Emergency Use Authorization (EUA). This EUA will remain in effect (meaning this test can be used) for the duration of the COVID-19 declaration under Section 564(b)(1) of the Act, 21 U.S.C. section 360bbb-3(b)(1), unless the authorization is terminated or revoked.  Performed at [facility], [place] [address], [place], [place] [address]          Radiology Studies: CT ABDOMEN PELVIS W CONTRAST  Result Date: 08/09/2020 CLINICAL DATA:  Abdominal distension with nausea and vomiting. History of colon cancer. EXAM: CT ABDOMEN AND PELVIS WITH CONTRAST TECHNIQUE: Multidetector CT imaging of the abdomen and pelvis was performed using the standard protocol following bolus administration of intravenous contrast. CONTRAST:  171mL OMNIPAQUE IOHEXOL 300 MG/ML  SOLN COMPARISON:  None available. FINDINGS: Lower chest: Linear and bandlike opacities within both lower lobes and right middle lobe typical of atelectasis. There is no pleural fluid. Heart is normal in size. Hepatobiliary: Heterogeneous and partially exophytic lesion arising from the left hepatic lobe measures 6.8 x 6.4 cm. There may be a central low-density scar. There is a 2.6 cm heterogeneous lesion in the left lobe, series 2, image 36. Mild background hepatic steatosis. Calcified gallstone in the  gallbladder without pericholecystic inflammation. Pancreas: Parenchymal atrophy. No ductal dilatation or inflammation. Spleen: Subcentimeter hypodensity centrally.  Normal in size. Adrenals/Urinary Tract: Normal adrenal glands. No hydronephrosis or perinephric edema. Homogeneous renal enhancement with symmetric excretion on delayed phase imaging. Punctate nonobstructing stone in the mid lower right kidney. Minimal cortical scarring of the right upper pole. Urinary bladder is physiologically distended without wall thickening. Stomach/Bowel: There is free air and free  fluid in the abdomen consistent with bowel perforation. Enhancement and wall thickening about the distal stomach with adjacent free fluid may represent site of perforation, series 2, image 40. Free air appears primarily in the upper rather than lower abdomen. There are scattered fluid-filled loops of small bowel without obstruction, favoring ileus. Colon appears tortuous and is difficult to accurately delineate. There is no obvious colonic wall thickening or inflammation. Appendix is tentatively visualized. There is no appendicitis. Vascular/Lymphatic: Aorto bi-iliac atherosclerosis. Patent portal vein. There is dilatation of the gonadal veins with reflux of contrast, left greater than right as well as prominent periuterine vascularity. Left ovarian vein measures 9 mm. Limited assessment for adenopathy. No bulky enlarged lymph nodes. Reproductive: Prominent periuterine and adnexal vascularity with dilatation of the ovarian veins. There is no adnexal mass. Other: Scattered free air and free fluid in the upper abdomen, most prominent in the right upper quadrant. There is no focal fluid collection or abscess. Free fluid tracks into the lesser sac and upper small bowel mesentery. Postsurgical change of the anterior abdominal wall. Musculoskeletal: Partial fusion of T10-T11 may be congenital or degenerative. There are scattered small sclerotic foci in the  pelvis are nonspecific. No acute fracture IMPRESSION: 1. Free air and free fluid in the abdomen consistent with bowel perforation. Enhancement and wall thickening about the distal stomach with adjacent free fluid is likely site of perforation, possible gastric ulcer. Inflammatory changes appear to be centered in the upper abdomen suggesting upper GI source. 2. Liver lesions, largest measuring 6.8 cm. Recommend correlation with any prior imaging if available to assess for stability. In the absence of prior imaging, recommend hepatic protocol MRI for further characterization after resolution of acute event. 3. Prominent periuterine and adnexal vascularity with dilatation of the ovarian veins, can be seen with pelvic congestion syndrome. 4. Fall stone.  Nonobstructing right renal stone. 5. Scattered small sclerotic foci in the pelvis are nonspecific. Aortic Atherosclerosis (ICD10-I70.0). Critical Value/emergent results were called by telephone at the time of interpretation on 08/09/2020 at 2:45 am to provider Lattie Haw , who verbally acknowledged these results. Electronically Signed   By: Keith Rake M.D.   On: 08/09/2020 02:45        Scheduled Meds:  bisacodyl  10 mg Rectal Daily   Chlorhexidine Gluconate Cloth  6 each Topical Daily   enoxaparin (LOVENOX) injection  40 mg Subcutaneous Q24H   insulin aspart  0-15 Units Subcutaneous Q4H   lip balm  1 application Topical BID   metoprolol tartrate  2.5 mg Intravenous Q8H   [START ON 08/12/2020] pantoprazole  40 mg Intravenous Q12H   pneumococcal 23 valent vaccine  0.5 mL Intramuscular Tomorrow-1000   Continuous Infusions:  sodium chloride     sodium chloride 500 mL (08/10/20 0120)   albumin human     lactated ringers     lactated ringers 100 mL/hr at 08/10/20 0122   methocarbamol (ROBAXIN) IV     ondansetron (ZOFRAN) IV     pantoprazole 8 mg/hr (08/10/20 0828)   piperacillin-tazobactam (ZOSYN)  IV 3.375 g (08/10/20 0949)     LOS: 1 day   Time  spent: 69mins Greater than 50% of this time was spent in counseling, explanation of diagnosis, planning of further management, and coordination of care.   Voice Recognition Viviann Spare dictation system was used to create this note, attempts have been made to correct errors. Please contact the author with questions and/or clarifications.   Florencia Reasons, MD PhD FACP [facility]  Available  via Epic secure chat 7am-7pm for nonurgent issues Please page for urgent issues To page the attending provider between 7A-7P or the covering provider during after hours 7P-7A, please log into the web site [email or web address] and access using universal [place] password for that web site. If you do not have the password, please call the hospital operator.    08/10/2020, 11:30 AM

## 2020-08-10 NOTE — Progress Notes (Signed)
Pt pulled out foley and IV and walked to the bathroom.  Pt stated that she woke up from a bad dream and needed to urinate. Pt is A&O x 4 and doesn't complain of any pain. NG tube is still in place. Gross, MD notified. No new orders.

## 2020-08-10 NOTE — Anesthesia Postprocedure Evaluation (Signed)
Anesthesia Post Note  Patient: [name] [name]  Procedure(s) Performed: LAPAROSCOPIC EXPLORATION OF ABDOMEN, PERFORATED ULCER REPAIR, LIVER BIOPSY (Abdomen)     Patient location during evaluation: PACU Anesthesia Type: General Level of consciousness: awake and alert Pain management: pain level controlled Vital Signs Assessment: post-procedure vital signs reviewed and stable Respiratory status: spontaneous breathing, nonlabored ventilation, respiratory function stable and patient connected to nasal cannula oxygen Cardiovascular status: blood pressure returned to baseline and stable Postop Assessment: no apparent nausea or vomiting Anesthetic complications: no   No notable events documented.              Effie Berkshire

## 2020-08-10 NOTE — Progress Notes (Signed)
[name] [name] [id number] 15-Jan-1946  CARE TEAM:  PCP: Pcp, No  Outpatient Care Team: Patient Care Team: Pcp, No as PCP - Huston Foley, MD as Consulting Physician (General Surgery)  Inpatient Treatment Team: Treatment Team: Attending Provider: Nolon Nations, MD; Consulting Physician: Edison Pace, Md, MD; Technician: Ernest Mallick, [place]; Rounding Team: Fatima Blank, MD; Consulting Physician: Florencia Reasons, MD; Registered Nurse: Dorinda Hill, RN; Utilization Review: Conception Oms, RN; Social Worker: Lowella Curb, [place]   Problem List:   Principal Problem:   Perforated gastric ulcer s/p lap omental Phillip Heal patch 08/09/2020 Active Problems:   Pneumoperitoneum   History of colorectal cancer   Liver mass, left lobe   Insulin-requiring or dependent type II diabetes mellitus ([place])   HTN (hypertension)   Constipation, chronic   Hyperglycemia   1 Day Post-Op  08/09/2020  POST-OPERATIVE DIAGNOSIS:   PERFORATED STOMACH ULCER LIVER MASSES HISTORY OF COLON CANCER   PROCEDURE:   OMENTAL [place]HAM PATCH OF PERFORATED ULCER CORE LIVER BIOPSIES DIAGNOSTIC LAPAROSCOPY LAPAROSCOPIC LYSIS OF ADHESIONS   SURGEON:  Adin Hector, MD      Assessment  Fair but stabilizing  [facility] Stay = 1 days)  Plan:  -Nasogastric tube decompression.  Await return of bowel function.  Once has flatus, can consider clamping trial plus or minus upper GI to make sure that the ulcer remains healed.  IV antibiotics x5 days minimum per protocol.  Aggressive antacid blockade with IV PPI.  Patient would benefit from outpatient gastroenterology follow-up with endoscopy to make sure ulcer has healed in 6-8 weeks.  Consider gastroenterology consultation in house prior to discharge to help set up.  H. pylori serologies ordered.  Follow-up.  Increased creatinine most likely reflection of dehydration and mild kidney disease.  Gentle hydration and follow urine output.  Acute blood loss  anemia.  Seems more than expected.  Follow closely.  Transfuse as needed.  Hopefully not too likely.  Follow-up on biopsies on liver masses given history of colorectal cancer.  I am concerned this could be new metastatic disease.  If positive for that, medical oncology consultation and evaluation.  VTE prophylaxis- SCDs, etc  Mobilize as tolerated to help recovery   Disposition:  Disposition:  The patient is from: Home  Anticipate discharge to:  Home with Home Health  Anticipated Date of Discharge is:  July 1,2022    Barriers to discharge:  Pending Clinical improvement (more likely than not)  Patient currently is NOT MEDICALLY STABLE for discharge from the hospital from a surgery standpoint.      30 minutes spent in review, evaluation, examination, counseling, and coordination of care.   I have reviewed this patient's available data, including medical history, events of note, physical examination and test results as part of my evaluation.  A significant portion of that time was spent in counseling.  Care during the described time interval was provided by me.  08/10/2020    Subjective: (Chief complaint)  Having less pain.  Lightheaded when standing.  Daughter in room.  Objective:  Vital signs:  Vitals:   08/10/20 0231 08/10/20 0408 08/10/20 0408 08/10/20 0924  BP: (!) 111/54 (!) 112/57 (!) 112/57 127/60  Pulse: (!) 118 (!) 118 (!) 115 (!) 115  Resp: 18 16 16 20   Temp: 99.8 F (37.7 C) 99.8 F (37.7 C) 99.8 F (37.7 C) 98.9 F (37.2 C)  TempSrc: Oral Oral Oral Oral  SpO2: 90% 100% 100% 98%  Weight:      Height:  Last BM Date: 08/07/20  Intake/Output   Yesterday:  06/25 0701 - 06/26 0700 In: 5574.9 [I.V.:3944.9; NG/GT:30; IV Piggyback:1600] Out: 1120 [Urine:1000; Emesis/NG output:100; Blood:20] This shift:  Total I/O In: 448.3 [I.V.:368.3; NG/GT:30; IV Piggyback:50] Out: 150 [Urine:150]  Bowel function:  Flatus: No  BM:  No  Drain: NGT  thin bilious   Physical Exam:  General: Pt awake/alert in no acute distress Eyes: PERRL, normal EOM.  Sclera clear.  No icterus Neuro: CN II-XII intact w/o focal sensory/motor deficits. Lymph: No head/neck/groin lymphadenopathy Psych:  No delerium/psychosis/paranoia.  Oriented x 4 HENT: Normocephalic, Mucus membranes moist.  No thrush Neck: Supple, No tracheal deviation.  No obvious thyromegaly Chest: No pain to chest wall compression.  Good respiratory excursion.  No audible wheezing CV:  Pulses intact.  Regular rhythm.  No major extremity edema MS: Normal AROM mjr joints.  No obvious deformity  Abdomen: Soft.  Moderately distended.  Mildly tender at incisions only.  No evidence of peritonitis.  No incarcerated hernias.  Ext:  No deformity.  No mjr edema.  No cyanosis Skin: No petechiae / purpurea.  No major sores.  Warm and dry    Results:   Cultures: Recent Results (from the past 720 hour(s))  Resp Panel by RT-PCR (Flu A&B, Covid) Nasopharyngeal Swab     Status: None   Collection Time: 08/08/20 11:35 PM   Specimen: Nasopharyngeal Swab; Nasopharyngeal(NP) swabs in vial transport medium  Result Value Ref Range Status   SARS Coronavirus 2 by RT PCR NEGATIVE NEGATIVE Final    Comment: (NOTE) SARS-CoV-2 target nucleic acids are NOT DETECTED.  The SARS-CoV-2 RNA is generally detectable in upper respiratory specimens during the acute phase of infection. The lowest concentration of SARS-CoV-2 viral copies this assay can detect is 138 copies/mL. A negative result does not preclude SARS-Cov-2 infection and should not be used as the sole basis for treatment or other patient management decisions. A negative result may occur with  improper specimen collection/handling, submission of specimen other than nasopharyngeal swab, presence of viral mutation(s) within the areas targeted by this assay, and inadequate number of viral copies(<138 copies/mL). A negative result must be combined  with clinical observations, patient history, and epidemiological information. The expected result is Negative.  Fact Sheet for Patients:  EntrepreneurPulse.com.au  Fact Sheet for Healthcare Providers:  IncredibleEmployment.be  This test is no t yet approved or cleared by the [place] FDA and  has been authorized for detection and/or diagnosis of SARS-CoV-2 by FDA under an Emergency Use Authorization (EUA). This EUA will remain  in effect (meaning this test can be used) for the duration of the COVID-19 declaration under Section 564(b)(1) of the Act, 21 U.S.C.section 360bbb-3(b)(1), unless the authorization is terminated  or revoked sooner.       Influenza A by PCR NEGATIVE NEGATIVE Final   Influenza B by PCR NEGATIVE NEGATIVE Final    Comment: (NOTE) The Xpert Xpress SARS-CoV-2/FLU/RSV plus assay is intended as an aid in the diagnosis of influenza from Nasopharyngeal swab specimens and should not be used as a sole basis for treatment. Nasal washings and aspirates are unacceptable for Xpert Xpress SARS-CoV-2/FLU/RSV testing.  Fact Sheet for Patients: EntrepreneurPulse.com.au  Fact Sheet for Healthcare Providers: IncredibleEmployment.be  This test is not yet approved or cleared by the [place] FDA and has been authorized for detection and/or diagnosis of SARS-CoV-2 by FDA under an Emergency Use Authorization (EUA). This EUA will remain in effect (meaning this test can be used) for the  duration of the COVID-19 declaration under Section 564(b)(1) of the Act, 21 U.S.C. section 360bbb-3(b)(1), unless the authorization is terminated or revoked.  Performed at [facility], [place] [address], [place], [place] [address]     Labs: Results for orders placed or performed during the hospital encounter of 08/08/20 (from the past 48 hour(s))  CBC with Differential     Status: Abnormal    Collection Time: 08/08/20 10:24 PM  Result Value Ref Range   WBC 9.7 4.0 - 10.5 K/uL   RBC 5.06 3.87 - 5.11 MIL/uL   Hemoglobin 15.6 (H) 12.0 - 15.0 g/dL   HCT 46.3 (H) 36.0 - 46.0 %   MCV 91.5 80.0 - 100.0 fL   MCH 30.8 26.0 - 34.0 pg   MCHC 33.7 30.0 - 36.0 g/dL   RDW 13.9 11.5 - 15.5 %   Platelets 248 150 - 400 K/uL   nRBC 0.0 0.0 - 0.2 %   Neutrophils Relative % 89 %   Neutro Abs 8.6 (H) 1.7 - 7.7 K/uL   Lymphocytes Relative 6 %   Lymphs Abs 0.5 (L) 0.7 - 4.0 K/uL   Monocytes Relative 5 %   Monocytes Absolute 0.5 0.1 - 1.0 K/uL   Eosinophils Relative 0 %   Eosinophils Absolute 0.0 0.0 - 0.5 K/uL   Basophils Relative 0 %   Basophils Absolute 0.0 0.0 - 0.1 K/uL   Immature Granulocytes 0 %   Abs Immature Granulocytes 0.02 0.00 - 0.07 K/uL    Comment: Performed at [facility], [place] [address], [place], [place] [address]  Comprehensive metabolic panel     Status: Abnormal   Collection Time: 08/08/20 10:24 PM  Result Value Ref Range   Sodium 132 (L) 135 - 145 mmol/L   Potassium 3.2 (L) 3.5 - 5.1 mmol/L   Chloride 98 98 - 111 mmol/L   CO2 24 22 - 32 mmol/L   Glucose, Bld 118 (H) 70 - 99 mg/dL    Comment: Glucose reference range applies only to samples taken after fasting for at least 8 hours.   BUN 22 8 - 23 mg/dL   Creatinine, Ser 0.71 0.44 - 1.00 mg/dL   Calcium 10.2 8.9 - 10.3 mg/dL   Total Protein 7.9 6.5 - 8.1 g/dL   Albumin 3.9 3.5 - 5.0 g/dL   AST 43 (H) 15 - 41 U/L   ALT 63 (H) 0 - 44 U/L   Alkaline Phosphatase 157 (H) 38 - 126 U/L   Total Bilirubin 1.1 0.3 - 1.2 mg/dL   GFR, Estimated >60 >60 mL/min    Comment: (NOTE) Calculated using the CKD-EPI Creatinine Equation (2021)    Anion gap 10 5 - 15    Comment: Performed at [facility], [place] [address] [place], [place] [address]  Lipase, blood     Status: None   Collection Time: 08/08/20 10:24 PM  Result Value Ref Range   Lipase 46 11 - 51 U/L    Comment: Performed at  [facility], [place] [address] [place], [place] [address]  Urinalysis, Routine w reflex microscopic Urine, Clean Catch     Status: Abnormal   Collection Time: 08/08/20 10:24 PM  Result Value Ref Range   Color, Urine YELLOW YELLOW   APPearance CLEAR CLEAR   Specific Gravity, Urine 1.016 1.005 - 1.030   pH 5.0 5.0 - 8.0   Glucose, UA NEGATIVE NEGATIVE mg/dL   Hgb urine dipstick NEGATIVE NEGATIVE  Bilirubin Urine NEGATIVE NEGATIVE   Ketones, ur 20 (A) NEGATIVE mg/dL   Protein, ur NEGATIVE NEGATIVE mg/dL   Nitrite NEGATIVE NEGATIVE   Leukocytes,Ua MODERATE (A) NEGATIVE   RBC / HPF 0-5 0 - 5 RBC/hpf   WBC, UA 11-20 0 - 5 WBC/hpf   Bacteria, UA RARE (A) NONE SEEN   Squamous Epithelial / LPF 0-5 0 - 5   Mucus PRESENT     Comment: Performed at [facility], [place] [address] [place], [place] [address]  Resp Panel by RT-PCR (Flu A&B, Covid) Nasopharyngeal Swab     Status: None   Collection Time: 08/08/20 11:35 PM   Specimen: Nasopharyngeal Swab; Nasopharyngeal(NP) swabs in vial transport medium  Result Value Ref Range   SARS Coronavirus 2 by RT PCR NEGATIVE NEGATIVE    Comment: (NOTE) SARS-CoV-2 target nucleic acids are NOT DETECTED.  The SARS-CoV-2 RNA is generally detectable in upper respiratory specimens during the acute phase of infection. The lowest concentration of SARS-CoV-2 viral copies this assay can detect is 138 copies/mL. A negative result does not preclude SARS-Cov-2 infection and should not be used as the sole basis for treatment or other patient management decisions. A negative result may occur with  improper specimen collection/handling, submission of specimen other than nasopharyngeal swab, presence of viral mutation(s) within the areas targeted by this assay, and inadequate number of viral copies(<138 copies/mL). A negative result must be combined with clinical observations, patient history, and epidemiological information. The  expected result is Negative.  Fact Sheet for Patients:  EntrepreneurPulse.com.au  Fact Sheet for Healthcare Providers:  IncredibleEmployment.be  This test is no t yet approved or cleared by the [place] FDA and  has been authorized for detection and/or diagnosis of SARS-CoV-2 by FDA under an Emergency Use Authorization (EUA). This EUA will remain  in effect (meaning this test can be used) for the duration of the COVID-19 declaration under Section 564(b)(1) of the Act, 21 U.S.C.section 360bbb-3(b)(1), unless the authorization is terminated  or revoked sooner.       Influenza A by PCR NEGATIVE NEGATIVE   Influenza B by PCR NEGATIVE NEGATIVE    Comment: (NOTE) The Xpert Xpress SARS-CoV-2/FLU/RSV plus assay is intended as an aid in the diagnosis of influenza from Nasopharyngeal swab specimens and should not be used as a sole basis for treatment. Nasal washings and aspirates are unacceptable for Xpert Xpress SARS-CoV-2/FLU/RSV testing.  Fact Sheet for Patients: EntrepreneurPulse.com.au  Fact Sheet for Healthcare Providers: IncredibleEmployment.be  This test is not yet approved or cleared by the [place] FDA and has been authorized for detection and/or diagnosis of SARS-CoV-2 by FDA under an Emergency Use Authorization (EUA). This EUA will remain in effect (meaning this test can be used) for the duration of the COVID-19 declaration under Section 564(b)(1) of the Act, 21 U.S.C. section 360bbb-3(b)(1), unless the authorization is terminated or revoked.  Performed at [facility], [place] [address] [place], [place] [address]   Hemoglobin A1c     Status: Abnormal   Collection Time: 08/09/20  3:58 AM  Result Value Ref Range   Hgb A1c MFr Bld 5.8 (H) 4.8 - 5.6 %    Comment: (NOTE) Pre diabetes:          5.7%-6.4%  Diabetes:              >6.4%  Glycemic control for   <7.0% adults  with diabetes    Mean Plasma Glucose 119.76 mg/dL    Comment:  Performed at [facility], [place] [address] [place], [place] [address]  Glucose, capillary     Status: Abnormal   Collection Time: 08/09/20  8:41 AM  Result Value Ref Range   Glucose-Capillary 149 (H) 70 - 99 mg/dL    Comment: Glucose reference range applies only to samples taken after fasting for at least 8 hours.  Glucose, capillary     Status: Abnormal   Collection Time: 08/09/20  5:36 PM  Result Value Ref Range   Glucose-Capillary 296 (H) 70 - 99 mg/dL    Comment: Glucose reference range applies only to samples taken after fasting for at least 8 hours.  Glucose, capillary     Status: Abnormal   Collection Time: 08/09/20 10:48 PM  Result Value Ref Range   Glucose-Capillary 279 (H) 70 - 99 mg/dL    Comment: Glucose reference range applies only to samples taken after fasting for at least 8 hours.  Glucose, capillary     Status: Abnormal   Collection Time: 08/10/20 12:20 AM  Result Value Ref Range   Glucose-Capillary 285 (H) 70 - 99 mg/dL    Comment: Glucose reference range applies only to samples taken after fasting for at least 8 hours.  Glucose, capillary     Status: Abnormal   Collection Time: 08/10/20  4:05 AM  Result Value Ref Range   Glucose-Capillary 213 (H) 70 - 99 mg/dL    Comment: Glucose reference range applies only to samples taken after fasting for at least 8 hours.  Basic metabolic panel     Status: Abnormal   Collection Time: 08/10/20  4:13 AM  Result Value Ref Range   Sodium 135 135 - 145 mmol/L   Potassium 3.7 3.5 - 5.1 mmol/L   Chloride 105 98 - 111 mmol/L   CO2 24 22 - 32 mmol/L   Glucose, Bld 212 (H) 70 - 99 mg/dL    Comment: Glucose reference range applies only to samples taken after fasting for at least 8 hours.   BUN 38 (H) 8 - 23 mg/dL   Creatinine, Ser 1.50 (H) 0.44 - 1.00 mg/dL   Calcium 8.3 (L) 8.9 - 10.3 mg/dL   GFR, Estimated 36 (L) >60 mL/min    Comment: (NOTE) Calculated using  the CKD-EPI Creatinine Equation (2021)    Anion gap 6 5 - 15    Comment: Performed at [facility], [place] [address], [place], [place] [address]  Magnesium     Status: Abnormal   Collection Time: 08/10/20  4:13 AM  Result Value Ref Range   Magnesium 1.6 (L) 1.7 - 2.4 mg/dL    Comment: Performed at [facility], [place] [address], [place], [place] [address]  Hepatic function panel     Status: Abnormal   Collection Time: 08/10/20  4:13 AM  Result Value Ref Range   Total Protein 5.3 (L) 6.5 - 8.1 g/dL   Albumin 2.6 (L) 3.5 - 5.0 g/dL   AST 90 (H) 15 - 41 U/L   ALT 87 (H) 0 - 44 U/L   Alkaline Phosphatase 70 38 - 126 U/L   Total Bilirubin 0.6 0.3 - 1.2 mg/dL   Bilirubin, Direct 0.3 (H) 0.0 - 0.2 mg/dL   Indirect Bilirubin 0.3 0.3 - 0.9 mg/dL    Comment: Performed at [facility], [place] [address] [place],  [address]  Phosphorus     Status: None   Collection Time: 08/10/20  4:13 AM  Result Value  Ref Range   Phosphorus 2.5 2.5 - 4.6 mg/dL    Comment: Performed at [facility], [place] [address] [place], [place] [address]  Renal function panel     Status: Abnormal   Collection Time: 08/10/20  4:13 AM  Result Value Ref Range   Sodium 137 135 - 145 mmol/L   Potassium 3.8 3.5 - 5.1 mmol/L   Chloride 104 98 - 111 mmol/L   CO2 25 22 - 32 mmol/L   Glucose, Bld 213 (H) 70 - 99 mg/dL    Comment: Glucose reference range applies only to samples taken after fasting for at least 8 hours.   BUN 37 (H) 8 - 23 mg/dL   Creatinine, Ser 1.41 (H) 0.44 - 1.00 mg/dL   Calcium 8.5 (L) 8.9 - 10.3 mg/dL   Phosphorus 2.5 2.5 - 4.6 mg/dL   Albumin 2.6 (L) 3.5 - 5.0 g/dL   GFR, Estimated 39 (L) >60 mL/min    Comment: (NOTE) Calculated using the CKD-EPI Creatinine Equation (2021)    Anion gap 8 5 - 15    Comment: Performed at [facility], [place] [address], [place], [place] [address]  CBC with Differential/Platelet      Status: Abnormal   Collection Time: 08/10/20  5:48 AM  Result Value Ref Range   WBC 9.3 4.0 - 10.5 K/uL   RBC 2.42 (L) 3.87 - 5.11 MIL/uL   Hemoglobin 7.6 (L) 12.0 - 15.0 g/dL    Comment: REPEATED TO VERIFY   HCT 22.2 (L) 36.0 - 46.0 %   MCV 91.7 80.0 - 100.0 fL   MCH 31.4 26.0 - 34.0 pg   MCHC 34.2 30.0 - 36.0 g/dL   RDW 14.1 11.5 - 15.5 %   Platelets 189 150 - 400 K/uL   nRBC 0.0 0.0 - 0.2 %   Neutrophils Relative % 72 %   Neutro Abs 6.6 1.7 - 7.7 K/uL   Lymphocytes Relative 18 %   Lymphs Abs 1.7 0.7 - 4.0 K/uL   Monocytes Relative 10 %   Monocytes Absolute 1.0 0.1 - 1.0 K/uL   Eosinophils Relative 0 %   Eosinophils Absolute 0.0 0.0 - 0.5 K/uL   Basophils Relative 0 %   Basophils Absolute 0.0 0.0 - 0.1 K/uL   Immature Granulocytes 0 %   Abs Immature Granulocytes 0.04 0.00 - 0.07 K/uL    Comment: Performed at [facility], [place] [address] [place], [place] [address]  Glucose, capillary     Status: Abnormal   Collection Time: 08/10/20  8:04 AM  Result Value Ref Range   Glucose-Capillary 154 (H) 70 - 99 mg/dL    Comment: Glucose reference range applies only to samples taken after fasting for at least 8 hours.    Imaging / Studies: CT ABDOMEN PELVIS W CONTRAST  Result Date: 08/09/2020 CLINICAL DATA:  Abdominal distension with nausea and vomiting. History of colon cancer. EXAM: CT ABDOMEN AND PELVIS WITH CONTRAST TECHNIQUE: Multidetector CT imaging of the abdomen and pelvis was performed using the standard protocol following bolus administration of intravenous contrast. CONTRAST:  112mL OMNIPAQUE IOHEXOL 300 MG/ML  SOLN COMPARISON:  None available. FINDINGS: Lower chest: Linear and bandlike opacities within both lower lobes and right middle lobe typical of atelectasis. There is no pleural fluid. Heart is normal in size. Hepatobiliary: Heterogeneous and partially exophytic lesion arising from the left hepatic lobe measures 6.8 x 6.4 cm. There may be a central  low-density scar. There is a  2.6 cm heterogeneous lesion in the left lobe, series 2, image 36. Mild background hepatic steatosis. Calcified gallstone in the gallbladder without pericholecystic inflammation. Pancreas: Parenchymal atrophy. No ductal dilatation or inflammation. Spleen: Subcentimeter hypodensity centrally.  Normal in size. Adrenals/Urinary Tract: Normal adrenal glands. No hydronephrosis or perinephric edema. Homogeneous renal enhancement with symmetric excretion on delayed phase imaging. Punctate nonobstructing stone in the mid lower right kidney. Minimal cortical scarring of the right upper pole. Urinary bladder is physiologically distended without wall thickening. Stomach/Bowel: There is free air and free fluid in the abdomen consistent with bowel perforation. Enhancement and wall thickening about the distal stomach with adjacent free fluid may represent site of perforation, series 2, image 40. Free air appears primarily in the upper rather than lower abdomen. There are scattered fluid-filled loops of small bowel without obstruction, favoring ileus. Colon appears tortuous and is difficult to accurately delineate. There is no obvious colonic wall thickening or inflammation. Appendix is tentatively visualized. There is no appendicitis. Vascular/Lymphatic: Aorto bi-iliac atherosclerosis. Patent portal vein. There is dilatation of the gonadal veins with reflux of contrast, left greater than right as well as prominent periuterine vascularity. Left ovarian vein measures 9 mm. Limited assessment for adenopathy. No bulky enlarged lymph nodes. Reproductive: Prominent periuterine and adnexal vascularity with dilatation of the ovarian veins. There is no adnexal mass. Other: Scattered free air and free fluid in the upper abdomen, most prominent in the right upper quadrant. There is no focal fluid collection or abscess. Free fluid tracks into the lesser sac and upper small bowel mesentery. Postsurgical change of  the anterior abdominal wall. Musculoskeletal: Partial fusion of T10-T11 may be congenital or degenerative. There are scattered small sclerotic foci in the pelvis are nonspecific. No acute fracture IMPRESSION: 1. Free air and free fluid in the abdomen consistent with bowel perforation. Enhancement and wall thickening about the distal stomach with adjacent free fluid is likely site of perforation, possible gastric ulcer. Inflammatory changes appear to be centered in the upper abdomen suggesting upper GI source. 2. Liver lesions, largest measuring 6.8 cm. Recommend correlation with any prior imaging if available to assess for stability. In the absence of prior imaging, recommend hepatic protocol MRI for further characterization after resolution of acute event. 3. Prominent periuterine and adnexal vascularity with dilatation of the ovarian veins, can be seen with pelvic congestion syndrome. 4. Fall stone.  Nonobstructing right renal stone. 5. Scattered small sclerotic foci in the pelvis are nonspecific. Aortic Atherosclerosis (ICD10-I70.0). Critical Value/emergent results were called by telephone at the time of interpretation on 08/09/2020 at 2:45 am to provider Lattie Haw , who verbally acknowledged these results. Electronically Signed   By: Keith Rake M.D.   On: 08/09/2020 02:45    Medications / Allergies: per chart  Antibiotics: Anti-infectives (From admission, onward)    Start     Dose/Rate Route Frequency Ordered Stop   08/09/20 1000  piperacillin-tazobactam (ZOSYN) IVPB 3.375 g        3.375 g 12.5 mL/hr over 240 Minutes Intravenous Every 8 hours 08/09/20 0407     08/09/20 0600  cefoTEtan (CEFOTAN) 2 g in sodium chloride 0.9 % 100 mL IVPB        2 g 200 mL/hr over 30 Minutes Intravenous On call to O.R. 08/09/20 0418 08/09/20 0659   08/09/20 0300  piperacillin-tazobactam (ZOSYN) IVPB 3.375 g        3.375 g 100 mL/hr over 30 Minutes Intravenous  Once 08/09/20 0253 08/09/20 0410  Note: Portions of this report may have been transcribed using voice recognition software. Every effort was made to ensure accuracy; however, inadvertent computerized transcription errors may be present.   Any transcriptional errors that result from this process are unintentional.    Adin Hector, MD, FACS, MASCRS Esophageal, Gastrointestinal & Colorectal Surgery Robotic and Minimally Invasive Surgery  [facility] [address] [place], [place] [address] [phone] Fax [phone] Main/Paging  CONTACT INFORMATION: Weekday (9AM-5PM) concerns: Call [facility] main office at [phone] Weeknight (5PM-9AM) or Weekend/Holiday concerns: Check [email or web address] for General Surgery [facility] coverage (Please, do not use SecureChat as it is not reliable communication to operating surgeons for immediate patient care)      08/10/2020  1:06 PM

## 2020-08-11 LAB — CBC
HCT: 21 % — ABNORMAL LOW (ref 36.0–46.0)
HCT: 20.7 % — ABNORMAL LOW (ref 36.0–46.0)
Hemoglobin: 7 g/dL — ABNORMAL LOW (ref 12.0–15.0)
Hemoglobin: 6.9 g/dL — CL (ref 12.0–15.0)
MCH: 31 pg (ref 26.0–34.0)
MCH: 31.4 pg (ref 26.0–34.0)
MCHC: 33.3 g/dL (ref 30.0–36.0)
MCHC: 33.3 g/dL (ref 30.0–36.0)
MCV: 92.9 fL (ref 80.0–100.0)
MCV: 94.1 fL (ref 80.0–100.0)
Platelets: 190 10*3/uL (ref 150–400)
Platelets: 178 10*3/uL (ref 150–400)
RBC: 2.26 MIL/uL — ABNORMAL LOW (ref 3.87–5.11)
RBC: 2.2 MIL/uL — ABNORMAL LOW (ref 3.87–5.11)
RDW: 14.1 % (ref 11.5–15.5)
RDW: 14.2 % (ref 11.5–15.5)
WBC: 9.7 10*3/uL (ref 4.0–10.5)
WBC: 8.8 10*3/uL (ref 4.0–10.5)
nRBC: 0 % (ref 0.0–0.2)
nRBC: 0 % (ref 0.0–0.2)

## 2020-08-11 LAB — BASIC METABOLIC PANEL
Anion gap: 7 (ref 5–15)
BUN: 23 mg/dL (ref 8–23)
CO2: 25 mmol/L (ref 22–32)
Calcium: 9 mg/dL (ref 8.9–10.3)
Chloride: 107 mmol/L (ref 98–111)
Creatinine, Ser: 0.79 mg/dL (ref 0.44–1.00)
GFR, Estimated: >60 mL/min (ref >60)
Glucose, Bld: 155 mg/dL — ABNORMAL HIGH (ref 70–99)
Potassium: 3.4 mmol/L — ABNORMAL LOW (ref 3.5–5.1)
Sodium: 139 mmol/L (ref 135–145)

## 2020-08-11 LAB — GLUCOSE, CAPILLARY
Glucose-Capillary: 117 mg/dL — ABNORMAL HIGH (ref 70–99)
Glucose-Capillary: 127 mg/dL — ABNORMAL HIGH (ref 70–99)
Glucose-Capillary: 131 mg/dL — ABNORMAL HIGH (ref 70–99)
Glucose-Capillary: 134 mg/dL — ABNORMAL HIGH (ref 70–99)
Glucose-Capillary: 137 mg/dL — ABNORMAL HIGH (ref 70–99)
Glucose-Capillary: 150 mg/dL — ABNORMAL HIGH (ref 70–99)

## 2020-08-11 LAB — HEPATITIS PANEL, ACUTE
HCV Ab: REACTIVE — AB
Hep A IgM: NONREACTIVE
Hep B C IgM: NONREACTIVE
Hepatitis B Surface Ag: NONREACTIVE

## 2020-08-11 LAB — HEMOGLOBIN AND HEMATOCRIT, BLOOD
HCT: 22.6 % — ABNORMAL LOW (ref 36.0–46.0)
Hemoglobin: 7.7 g/dL — ABNORMAL LOW (ref 12.0–15.0)

## 2020-08-11 LAB — MAGNESIUM: Magnesium: 2.5 mg/dL — ABNORMAL HIGH (ref 1.7–2.4)

## 2020-08-11 LAB — PREPARE RBC (CROSSMATCH)

## 2020-08-11 LAB — ABO/RH: ABO/RH(D): AB POS

## 2020-08-11 MED ORDER — SODIUM CHLORIDE 0.9% IV SOLUTION: Freq: Once | INTRAVENOUS | Status: DC

## 2020-08-11 MED ORDER — POTASSIUM CHLORIDE 10 MEQ/100ML IV SOLN
10.0000 meq | INTRAVENOUS | Status: AC
  Administered 2020-08-11 (×3): 10 meq via INTRAVENOUS
  Filled 2020-08-11: qty 100

## 2020-08-11 NOTE — Progress Notes (Signed)
2 Days Post-Op  Subjective: CC: Nursing note reviewed from yesterday.  Patient apparently pulled out her Foley last night.  Has voided since then.  She reports she is having some epigastric discomfort.  No nausea.  Began passing flatus.  Had a small formed BM this am.  NG tube output 100 cc and bilious.  Has not mobilized in the halls but is up in chair.  O2 sats 80% at the lowest.  Currently on room air.  Denies any chest pain or shortness of breath.  Objective: Vital signs in last 24 hours: Temp:  [98.6 F (37 C)-100.1 F (37.8 C)] 98.8 F (37.1 C) (06/27 0922) Pulse Rate:  [107-123] 107 (06/27 0922) Resp:  [17-18] 18 (06/27 0922) BP: (116-141)/(54-68) 131/68 (06/27 0922) SpO2:  [80 %-100 %] 92 % (06/27 0922) Last BM Date: 08/07/20  Intake/Output from previous day: 06/26 0701 - 06/27 0700 In: 1752.9 [I.V.:1595.1; NG/GT:30; IV Piggyback:127.8] Out: 600 [Urine:500; Emesis/NG output:100] Intake/Output this shift: No intake/output data recorded.  PE: Gen:  Alert, NAD, pleasant Card: Tachycardic with regular rhythm Pulm:  CTAB, no W/R/R, effort normal Abd: Soft, mild epigastric tenderness that appears appropriate in the postoperative setting. Otherwise NT. No peritonitis. Laparoscopic incisions with Tegaderm dressing and gauze in place.  The epigastric incision has a small amount of dried blood on gauze and otherwise incisions are c/d/I. Hypoactive bowel sounds. Noted prior midline wound that is well healed Ext:  No LE edema or calf tenderness Psych: A&Ox3  Skin: no rashes noted, warm and dry   Lab Results:  Recent Labs    08/10/20 0548 08/11/20 0419  WBC 9.3 9.7  HGB 7.6* 7.0*  HCT 22.2* 21.0*  PLT 189 190   BMET Recent Labs    08/10/20 0413 08/11/20 0419  NA 137  135 139  K 3.8  3.7 3.4*  CL 104  105 107  CO2 25  24 25   GLUCOSE 213*  212* 155*  BUN 37*  38* 23  CREATININE 1.41*  1.50* 0.79  CALCIUM 8.5*  8.3* 9.0   PT/INR No results for  input(s): LABPROT, INR in the last 72 hours. CMP     Component Value Date/Time   NA 139 08/11/2020 0419   K 3.4 (L) 08/11/2020 0419   CL 107 08/11/2020 0419   CO2 25 08/11/2020 0419   GLUCOSE 155 (H) 08/11/2020 0419   BUN 23 08/11/2020 0419   CREATININE 0.79 08/11/2020 0419   CALCIUM 9.0 08/11/2020 0419   PROT 5.3 (L) 08/10/2020 0413   ALBUMIN 2.6 (L) 08/10/2020 0413   ALBUMIN 2.6 (L) 08/10/2020 0413   AST 90 (H) 08/10/2020 0413   ALT 87 (H) 08/10/2020 0413   ALKPHOS 70 08/10/2020 0413   BILITOT 0.6 08/10/2020 0413   GFRNONAA >60 08/11/2020 0419   Lipase     Component Value Date/Time   LIPASE 46 08/08/2020 2224       Studies/Results: No results found.  Anti-infectives: Anti-infectives (From admission, onward)    Start     Dose/Rate Route Frequency Ordered Stop   08/09/20 1000  piperacillin-tazobactam (ZOSYN) IVPB 3.375 g        3.375 g 12.5 mL/hr over 240 Minutes Intravenous Every 8 hours 08/09/20 0407     08/09/20 0600  cefoTEtan (CEFOTAN) 2 g in sodium chloride 0.9 % 100 mL IVPB        2 g 200 mL/hr over 30 Minutes Intravenous On call to O.R. 08/09/20 7408 08/09/20 1448  08/09/20 0300  piperacillin-tazobactam (ZOSYN) IVPB 3.375 g        3.375 g 100 mL/hr over 30 Minutes Intravenous  Once 08/09/20 0253 08/09/20 0410        Assessment/Plan POD 2 s/p diagnostic laparoscopy, LOA, omental Phillip Heal patch of perforated ulcer, core liver biopsy for perforated stomach ulcer, liver masses and history of colon cancer-Dr. Susann Givens - Keep NGT in place. Plan UGI POD 4-5 - Cont IV abx x 5 days - PPI gtt - Foley out voiding - Mobilize. PT - Path pending.  - Pulm toilet. Sp02 80% overnight. On RA currently without hypoxia. Lungs CTA b/l. No CP or SOB.  - AM labs  - Will need outpatient GI f/u for endoscopy to ensure ulcer has healed in 6-8 weeks  - H. Pylori series pending.   FEN - NPO, NGT, IVF VTE - SCDs ID - Zosyn Foley - out Follow-Up - Dr.  Johney Maine  Appreciate TRH's assistance ABL anemia - hgb 7.0 this am from 7.6. Hold lovenox. PM CBC.  Hx of colon cancer - bx pending AKI on CKD3 - AKI resolved DM2 HTN   LOS: 2 days    [name] , [facility] Surgery 08/11/2020, 9:50 AM Please see Amion for pager number during day hours 7:00am-4:30pm

## 2020-08-11 NOTE — TOC Initial Note (Signed)
Transition of Care [facility]) - Initial/Assessment Note   Patient Details  Name: [name] [name] MRN: [id number] Date of Birth: 02-May-1945  Transition of Care [facility]) CM/SW Contact:    Sherie Don, LCSW Phone Number: 08/11/2020, 10:48 AM  Clinical Narrative: TOC received consult for PCP assistance as patient is uninsured. CSW met with patient and her daughter to discuss options. Per daughter, patient has been here 2 months and just received her SSN so the family is trying to get her insurance. CSW discussed options with daughter and explained family can follow up with [place] to see if patient is eligible for Medicaid. CSW also recommended family call the toll-free Medicare number. Daughter agreeable to CSW referring patient to a community clinic for a hospital follow-up appointment/PCP assistance.  [facility] is not accepting new patients at this time as the clinic is changing, so a hospital follow-up appointment was scheduled at [facility] with Juluis Mire on Wednesday September 10, 2020 at 9:30am. Daughter updated with appointment. Appointment and DSS contact information added to AVS. TOC to follow.  Expected Discharge Plan: Home/Self Care Barriers to Discharge: Continued Medical Work up, Inadequate or no insurance  Patient Goals and CMS Choice Patient states their goals for this hospitalization and ongoing recovery are:: Get insurance CMS Medicare.gov Compare Post Acute Care list provided to:: Patient Represenative (must comment) Choice offered to / list presented to : Patient, Adult Children  Expected Discharge Plan and Services Expected Discharge Plan: Home/Self Care In-house Referral: Clinical Social Work Discharge Planning Services: [place] Acute Care Choice: NA Living arrangements for the past 2 months: Apartment              DME Arranged: N/A DME Agency: NA  Prior Living Arrangements/Services Living  arrangements for the past 2 months: Apartment Lives with:: Adult Children Patient language and need for interpreter reviewed:: Yes Do you feel safe going back to the place where you live?: Yes      Need for Family Participation in Patient Care: Yes (Comment) Care giver support system in place?: Yes (comment) Criminal Activity/Legal Involvement Pertinent to Current Situation/Hospitalization: No - Comment as needed  Activities of Daily Living Home Assistive Devices/Equipment: None ADL Screening (condition at time of admission) Patient's cognitive ability adequate to safely complete daily activities?: Yes Is the patient deaf or have difficulty hearing?: No Does the patient have difficulty seeing, even when wearing glasses/contacts?: No Does the patient have difficulty concentrating, remembering, or making decisions?: No Patient able to express need for assistance with ADLs?: Yes Does the patient have difficulty dressing or bathing?: No Independently performs ADLs?: No Communication: Needs assistance Is this a change from baseline?: Pre-admission baseline Dressing (OT): Needs assistance Is this a change from baseline?: Pre-admission baseline Grooming: Appropriate for developmental age Feeding: Appropriate for developmental age Bathing: Needs assistance Is this a change from baseline?: Pre-admission baseline Toileting: Needs assistance Is this a change from baseline?: Pre-admission baseline In/Out Bed: Needs assistance Is this a change from baseline?: Change from baseline, expected to last <3 days Walks in Home: Independent Does the patient have difficulty walking or climbing stairs?: Yes Weakness of Legs: None Weakness of Arms/Hands: None  Permission Sought/Granted Permission sought to share information with : Other (comment) Permission granted to share information with : Yes, Verbal Permission Granted Permission granted to share info w AGENCY: Community clinics for hospital follow  up appointment  Emotional Assessment Appearance:: Appears stated age Affect (typically observed): Appropriate Alcohol /  Substance Use: Not Applicable Psych Involvement: No (comment)  Admission diagnosis:  Bowel perforation ([place]) [K63.1] Perforated gastric ulcer ([place]) [K25.5] Patient Active Problem List   Diagnosis Date Noted   Hyperglycemia 08/10/2020   Pneumoperitoneum 08/09/2020   History of colorectal cancer 08/09/2020   Liver mass, left lobe 08/09/2020   Insulin-requiring or dependent type II diabetes mellitus ([place]) 08/09/2020   HTN (hypertension) 08/09/2020   Constipation, chronic 08/09/2020   Perforated gastric ulcer s/p lap omental Phillip Heal patch 08/09/2020 08/09/2020   PCP:  Pcp, No Pharmacy:   [id number] [address] [id number] [id number]: [phone]: [phone] Readmission Risk Interventions No flowsheet data found.

## 2020-08-11 NOTE — Evaluation (Signed)
Physical Therapy Evaluation Patient Details Name: [name] [name] MRN: [id number] DOB: [birth year] Today's Date: 08/11/2020   History of Present Illness  Patient is 75 y.o. female presented to ED with abdominal pain; CT indicates inflammation at the pylorus/duodenal region suspicion for perforated ulcer as well as moderate mass of the left lateral sector of the liver. Pt underwent laparoscopic repair lysis of adhesions, and repair of perforated ulcer on 6/25. PMH significant for DM, HTN, glaucoma.   Clinical Impression  [name] [name] is 75 y.o. female admitted with above HPI and diagnosis. Patient is currently limited by functional impairments below (see PT problem list). Patient recently moved here from [place] and lives with her daughter and is independent at baseline. She is mobilizing at min guard/supervision level with use of IV pole during gait for stability. Patient has 2 flights of stairs to enter her home. Patient will benefit from continued skilled PT interventions to address impairments and progress independence with mobility, recommending HHPT follow up. Acute PT will follow and progress as able.     Follow Up Recommendations Home health PT    Equipment Recommendations  None recommended by PT    Recommendations for Other Services       Precautions / Restrictions Precautions Precautions: Fall Restrictions Weight Bearing Restrictions: No      Mobility  Bed Mobility               General bed mobility comments: pt OOB in recliner    Transfers Overall transfer level: Needs assistance Equipment used: None Transfers: Sit to/from Stand Sit to Stand: Min guard         General transfer comment: guarding for safety, pt using bil UE on armrests for power up and demonstrated safe reach back to sit at EOS  Ambulation/Gait Ambulation/Gait assistance: Min guard;Supervision Gait Distance (Feet): 340 Feet Assistive device: IV Pole Gait  Pattern/deviations: Step-through pattern;Decreased stride length;Shuffle Gait velocity: decr   General Gait Details: cues for safe use/grip on IV pole for gait. pt with overall steady, slow, cautious gait, no overt LOB noted.  Stairs            Wheelchair Mobility    Modified Rankin (Stroke Patients Only)       Balance Overall balance assessment: Mild deficits observed, not formally tested                                           Pertinent Vitals/Pain Pain Assessment: No/denies pain    Home Living Family/patient expects to be discharged to:: Private residence Living Arrangements: Children Available Help at Discharge: Family Type of Home: House (upstairs apartment in house) Home Access: Level entry Entrance Stairs-Rails: Left   Home Layout: Multi-level Home Equipment: None Additional Comments: pt lives with daugher    Prior Function Level of Independence: Independent         Comments: pt just moved here from [place] in last ~2 months     Hand Dominance   Dominant Hand: Right    Extremity/Trunk Assessment   Upper Extremity Assessment Upper Extremity Assessment: Overall WFL for tasks assessed    Lower Extremity Assessment Lower Extremity Assessment: Overall WFL for tasks assessed    Cervical / Trunk Assessment Cervical / Trunk Assessment: Normal  Communication   Communication: No difficulties  Cognition Arousal/Alertness: Awake/alert Behavior During Therapy: WFL for tasks assessed/performed Overall Cognitive Status: Within Functional Limits  for tasks assessed                                        General Comments      Exercises     Assessment/Plan    PT Assessment Patient needs continued PT services  PT Problem List Decreased strength;Decreased activity tolerance;Decreased balance;Decreased mobility;Decreased knowledge of use of DME       PT Treatment Interventions DME instruction;Gait  training;Stair training;Functional mobility training;Therapeutic exercise;Therapeutic activities;Balance training;Neuromuscular re-education;Patient/family education    PT Goals (Current goals can be found in the Care Plan section)  Acute Rehab PT Goals Patient Stated Goal: get back indpendence PT Goal Formulation: With patient/family Time For Goal Achievement: 08/25/20 Potential to Achieve Goals: Good    Frequency Min 3X/week   Barriers to discharge        Co-evaluation               AM-PAC PT "6 Clicks" Mobility  Outcome Measure Help needed turning from your back to your side while in a flat bed without using bedrails?: A Little Help needed moving from lying on your back to sitting on the side of a flat bed without using bedrails?: A Little Help needed moving to and from a bed to a chair (including a wheelchair)?: A Little Help needed standing up from a chair using your arms (e.g., wheelchair or bedside chair)?: A Little Help needed to walk in hospital room?: A Little Help needed climbing 3-5 steps with a railing? : A Little 6 Click Score: 18    End of Session Equipment Utilized During Treatment: Gait belt Activity Tolerance: Patient tolerated treatment well Patient left: in chair;with call bell/phone within reach;with chair alarm set;with family/visitor present Nurse Communication: Mobility status PT Visit Diagnosis: Other abnormalities of gait and mobility (R26.89);Difficulty in walking, not elsewhere classified (R26.2)    Time: 4193-7902 PT Time Calculation (min) (ACUTE ONLY): 25 min   Charges:   PT Evaluation $PT Eval Low Complexity: 1 Low PT Treatments $Gait Training: 8-22 mins        Verner Mould, DPT Acute Rehabilitation Services Office [phone] Pager [phone]   Jacques Navy 08/11/2020, 11:17 AM

## 2020-08-11 NOTE — Progress Notes (Signed)
PROGRESS NOTE    [name] [name]  AJO:[id number] DOB: [birth year] DOA: 08/08/2020 PCP: Pcp, No   Chief Complain: Abdominal pain  Brief Narrative: Patient is a 75 year old female with history of diabetes type 2, hypertension, recently relocated from [place] who presented to the emergency department with complaints of abdominal pain.  CT abdomen on presentation showed gas in the upper abdomen with some inflammation at the pylorus/duodenal region suspicious for perforated ulcer.  Patient was admitted under general surgery service and she underwent omental Graham's patch on 08/09/2020.  Awaiting return of bowel function.  We are consulting.  Assessment & Plan:   Principal Problem:   Perforated gastric ulcer s/p lap omental Phillip Heal patch 08/09/2020 Active Problems:   Pneumoperitoneum   History of colorectal cancer   Liver mass, left lobe   Insulin-requiring or dependent type II diabetes mellitus ([facility])   HTN (hypertension)   Constipation, chronic   Hyperglycemia   Perforated gastric ulcer: Presented with abdominal pain.  General surgery did laparoscopic omental Graham's patch on 07/2470.  Currently on IV PPI, IV antibiotics( zosyn), IV hydration.  Currently NPO.  Management as per general surgery.  Has NG tube. General surgery recommending outpatient GI follow-up for endoscopy in 6-8 weeks  Liver mass/history of colon cancer: Has some elevated LFTs.  Status post core biopsy of the liver mass.  Pathology is pending.  Normocytic anemia/acute blood loss anemia: Most likely secondary to acute blood loss from surgery.  Hemoglobin in the range of 7 today.  We recommend blood transfusion if hemoglobin drops less than 7.  Hyponatremia/hypokalemia/hypomagnesemia: Continue to monitor and supplement  AKI on CKD stage II: Monitor kidney function.  Continue IV fluids  Diabetes type 2: Hemoglobin A1c of 5.8.  Currently n.p.o., on sliding scale insulin  Hypertension: On nifedipine,  Cozaar/hydrochlorothiazide at home.  Oral medications on hold.  Glaucoma: Continue home eyedrops            DVT prophylaxis:SCD Code Status: Full   Procedures:As above  Antimicrobials:  Anti-infectives (From admission, onward)    Start     Dose/Rate Route Frequency Ordered Stop   08/09/20 1000  piperacillin-tazobactam (ZOSYN) IVPB 3.375 g        3.375 g 12.5 mL/hr over 240 Minutes Intravenous Every 8 hours 08/09/20 0407     08/09/20 0600  cefoTEtan (CEFOTAN) 2 g in sodium chloride 0.9 % 100 mL IVPB        2 g 200 mL/hr over 30 Minutes Intravenous On call to O.R. 08/09/20 0418 08/09/20 0659   08/09/20 0300  piperacillin-tazobactam (ZOSYN) IVPB 3.375 g        3.375 g 100 mL/hr over 30 Minutes Intravenous  Once 08/09/20 0253 08/09/20 0410       Subjective:  Patient seen and examined the bedside this morning.  Hemodynamically stable.  Daughter at the bedside.  Patient was asking when the NG tube can be pulled out.  Denies any new complaints.  No nausea, vomiting or severe abdominal pain.   Objective: Vitals:   08/10/20 2238 08/11/20 0204 08/11/20 0632 08/11/20 0922  BP: 129/64 139/66 (!) 141/66 131/68  Pulse: (!) 123 (!) 121 (!) 113 (!) 107  Resp: 17 18 17 18   Temp: 100.1 F (37.8 C) 99.1 F (37.3 C) 98.6 F (37 C) 98.8 F (37.1 C)  TempSrc: Oral Oral Oral Oral  SpO2: (!) 80% 100% 91% 92%  Weight:      Height:        Intake/Output Summary (Last 24  hours) at 08/11/2020 1037 Last data filed at 08/11/2020 1000 Gross per 24 hour  Intake 1334.63 ml  Output 450 ml  Net 884.63 ml   Filed Weights   08/09/20 1500  Weight: 65 kg    Examination:  General exam: Appears calm and comfortable ,Not in distress,average built HEENT:PERRL,Oral mucosa moist, Ear/Nose normal on gross exam,NG tube Respiratory system: Bilateral equal air entry, normal vesicular breath sounds, no wheezes or crackles  Cardiovascular system: S1 & S2 heard, RRR. No JVD, murmurs, rubs, gallops  or clicks. No pedal edema. Gastrointestinal system: Abdomen is nondistended, soft and nontender. No organomegaly or masses felt. Normal bowel sounds heard.  Clean surgical wounds on the abdomen Central nervous system: Alert and oriented. No focal neurological deficits. Extremities: No edema, no clubbing ,no cyanosis Skin: No rashes, lesions or ulcers,no icterus ,no pallor    Data Reviewed: I have personally reviewed following labs and imaging studies  CBC: Recent Labs  Lab 08/08/20 2224 08/10/20 0548 08/11/20 0419  WBC 9.7 9.3 9.7  NEUTROABS 8.6* 6.6  --   HGB 15.6* 7.6* 7.0*  HCT 46.3* 22.2* 21.0*  MCV 91.5 91.7 92.9  PLT 248 189 209   Basic Metabolic Panel: Recent Labs  Lab 08/08/20 2224 08/10/20 0413 08/11/20 0419  NA 132* 137  135 139  K 3.2* 3.8  3.7 3.4*  CL 98 104  105 107  CO2 24 25  24 25   GLUCOSE 118* 213*  212* 155*  BUN 22 37*  38* 23  CREATININE 0.71 1.41*  1.50* 0.79  CALCIUM 10.2 8.5*  8.3* 9.0  MG  --  1.6* 2.5*  PHOS  --  2.5  2.5  --    GFR: Estimated Creatinine Clearance: 53.3 mL/min (by C-G formula based on SCr of 0.79 mg/dL). Liver Function Tests: Recent Labs  Lab 08/08/20 2224 08/10/20 0413  AST 43* 90*  ALT 63* 87*  ALKPHOS 157* 70  BILITOT 1.1 0.6  PROT 7.9 5.3*  ALBUMIN 3.9 2.6*  2.6*   Recent Labs  Lab 08/08/20 2224  LIPASE 46   No results for input(s): AMMONIA in the last 168 hours. Coagulation Profile: No results for input(s): INR, PROTIME in the last 168 hours. Cardiac Enzymes: No results for input(s): CKTOTAL, CKMB, CKMBINDEX, TROPONINI in the last 168 hours. BNP (last 3 results) No results for input(s): PROBNP in the last 8760 hours. HbA1C: Recent Labs    08/09/20 0358  HGBA1C 5.8*   CBG: Recent Labs  Lab 08/10/20 1402 08/10/20 1744 08/10/20 2239 08/11/20 0203 08/11/20 0638  GLUCAP 142* 140* 183* 150* 137*   Lipid Profile: No results for input(s): CHOL, HDL, LDLCALC, TRIG, CHOLHDL, LDLDIRECT in  the last 72 hours. Thyroid Function Tests: No results for input(s): TSH, T4TOTAL, FREET4, T3FREE, THYROIDAB in the last 72 hours. Anemia Panel: No results for input(s): VITAMINB12, FOLATE, FERRITIN, TIBC, IRON, RETICCTPCT in the last 72 hours. Sepsis Labs: No results for input(s): PROCALCITON, LATICACIDVEN in the last 168 hours.  Recent Results (from the past 240 hour(s))  Resp Panel by RT-PCR (Flu A&B, Covid) Nasopharyngeal Swab     Status: None   Collection Time: 08/08/20 11:35 PM   Specimen: Nasopharyngeal Swab; Nasopharyngeal(NP) swabs in vial transport medium  Result Value Ref Range Status   SARS Coronavirus 2 by RT PCR NEGATIVE NEGATIVE Final    Comment: (NOTE) SARS-CoV-2 target nucleic acids are NOT DETECTED.  The SARS-CoV-2 RNA is generally detectable in upper respiratory specimens during the acute phase of  infection. The lowest concentration of SARS-CoV-2 viral copies this assay can detect is 138 copies/mL. A negative result does not preclude SARS-Cov-2 infection and should not be used as the sole basis for treatment or other patient management decisions. A negative result may occur with  improper specimen collection/handling, submission of specimen other than nasopharyngeal swab, presence of viral mutation(s) within the areas targeted by this assay, and inadequate number of viral copies(<138 copies/mL). A negative result must be combined with clinical observations, patient history, and epidemiological information. The expected result is Negative.  Fact Sheet for Patients:  EntrepreneurPulse.com.au  Fact Sheet for Healthcare Providers:  IncredibleEmployment.be  This test is no t yet approved or cleared by the [place] FDA and  has been authorized for detection and/or diagnosis of SARS-CoV-2 by FDA under an Emergency Use Authorization (EUA). This EUA will remain  in effect (meaning this test can be used) for the duration of  the COVID-19 declaration under Section 564(b)(1) of the Act, 21 U.S.C.section 360bbb-3(b)(1), unless the authorization is terminated  or revoked sooner.       Influenza A by PCR NEGATIVE NEGATIVE Final   Influenza B by PCR NEGATIVE NEGATIVE Final    Comment: (NOTE) The Xpert Xpress SARS-CoV-2/FLU/RSV plus assay is intended as an aid in the diagnosis of influenza from Nasopharyngeal swab specimens and should not be used as a sole basis for treatment. Nasal washings and aspirates are unacceptable for Xpert Xpress SARS-CoV-2/FLU/RSV testing.  Fact Sheet for Patients: EntrepreneurPulse.com.au  Fact Sheet for Healthcare Providers: IncredibleEmployment.be  This test is not yet approved or cleared by the [place] FDA and has been authorized for detection and/or diagnosis of SARS-CoV-2 by FDA under an Emergency Use Authorization (EUA). This EUA will remain in effect (meaning this test can be used) for the duration of the COVID-19 declaration under Section 564(b)(1) of the Act, 21 U.S.C. section 360bbb-3(b)(1), unless the authorization is terminated or revoked.  Performed at [facility], [place] [address] [place], [place] [address]          Radiology Studies: No results found.      Scheduled Meds:  bisacodyl  10 mg Rectal Daily   Chlorhexidine Gluconate Cloth  6 each Topical Daily   insulin aspart  0-15 Units Subcutaneous Q4H   lip balm  1 application Topical BID   [START ON 08/12/2020] pantoprazole  40 mg Intravenous Q12H   pneumococcal 23 valent vaccine  0.5 mL Intramuscular Tomorrow-1000   Continuous Infusions:  sodium chloride 10 mL/hr at 08/10/20 1800   albumin human     lactated ringers 50 mL/hr at 08/10/20 2318   methocarbamol (ROBAXIN) IV     ondansetron (ZOFRAN) IV     pantoprazole 8 mg/hr (08/11/20 0030)   piperacillin-tazobactam (ZOSYN)  IV 3.375 g (08/11/20 0346)   potassium chloride 10 mEq  (08/11/20 0916)     LOS: 2 days    Time spent: 35 mins.More than 50% of that time was spent in counseling and/or coordination of care.      Shelly Coss, MD [facility] P6/27/2022, 10:37 AM

## 2020-08-11 NOTE — Progress Notes (Signed)
Date and time results received: 08/11/20 1310 (use smartphrase ".now" to insert current time)  Test: Hgb Critical Value: 6.9  Name of Provider Notified: Alferd Apa  Orders Received? Or Actions Taken?: awaiting orders

## 2020-08-12 ENCOUNTER — Inpatient Hospital Stay (HOSPITAL_COMMUNITY): Payer: Medicaid Other

## 2020-08-12 LAB — CBC
HCT: 23.2 % — ABNORMAL LOW (ref 36.0–46.0)
Hemoglobin: 7.7 g/dL — ABNORMAL LOW (ref 12.0–15.0)
MCH: 30.7 pg (ref 26.0–34.0)
MCHC: 33.2 g/dL (ref 30.0–36.0)
MCV: 92.4 fL (ref 80.0–100.0)
Platelets: 160 10*3/uL (ref 150–400)
RBC: 2.51 MIL/uL — ABNORMAL LOW (ref 3.87–5.11)
RDW: 14.7 % (ref 11.5–15.5)
WBC: 7.8 10*3/uL (ref 4.0–10.5)
nRBC: 0.3 % — ABNORMAL HIGH (ref 0.0–0.2)

## 2020-08-12 LAB — TYPE AND SCREEN
ABO/RH(D): AB POS
Antibody Screen: NEGATIVE
Unit division: 0

## 2020-08-12 LAB — GLUCOSE, CAPILLARY
Glucose-Capillary: 103 mg/dL — ABNORMAL HIGH (ref 70–99)
Glucose-Capillary: 115 mg/dL — ABNORMAL HIGH (ref 70–99)
Glucose-Capillary: 126 mg/dL — ABNORMAL HIGH (ref 70–99)
Glucose-Capillary: 127 mg/dL — ABNORMAL HIGH (ref 70–99)
Glucose-Capillary: 131 mg/dL — ABNORMAL HIGH (ref 70–99)
Glucose-Capillary: 132 mg/dL — ABNORMAL HIGH (ref 70–99)
Glucose-Capillary: 158 mg/dL — ABNORMAL HIGH (ref 70–99)

## 2020-08-12 LAB — BASIC METABOLIC PANEL
Anion gap: 6 (ref 5–15)
BUN: 16 mg/dL (ref 8–23)
CO2: 25 mmol/L (ref 22–32)
Calcium: 8.9 mg/dL (ref 8.9–10.3)
Chloride: 111 mmol/L (ref 98–111)
Creatinine, Ser: 0.65 mg/dL (ref 0.44–1.00)
GFR, Estimated: >60 mL/min (ref >60)
Glucose, Bld: 120 mg/dL — ABNORMAL HIGH (ref 70–99)
Potassium: 3.7 mmol/L (ref 3.5–5.1)
Sodium: 142 mmol/L (ref 135–145)

## 2020-08-12 LAB — BPAM RBC
Blood Product Expiration Date: 202207252359
ISSUE DATE / TIME: 202206271750
Unit Type and Rh: 6200

## 2020-08-12 LAB — H PYLORI, IGM, IGG, IGA AB
H Pylori IgG: 2.92 Index Value — ABNORMAL HIGH (ref 0.00–0.79)
H. Pylogi, Iga Abs: 14.9 units — ABNORMAL HIGH (ref 0.0–8.9)
H. Pylogi, Igm Abs: <9 units (ref 0.0–8.9)

## 2020-08-12 MED ORDER — FUROSEMIDE 10 MG/ML IJ SOLN
40.0000 mg | Freq: Once | INTRAMUSCULAR | Status: AC
  Administered 2020-08-12: 40 mg via INTRAVENOUS
  Filled 2020-08-12: qty 4

## 2020-08-12 NOTE — Progress Notes (Signed)
3 Days Post-Op  Subjective: CC: Some soreness around her midline incision that is well controlled.  No nausea. NGT in place. Mobilizing with therapies.  Recommended for home health.  She is passing flatus.  Notes small BM yesterday.  Voiding.  Hypoxic overnight.  No chest pain or shortness of breath.  Denies lower extremity pain or swelling.  Objective: Vital signs in last 24 hours: Temp:  [98.2 F (36.8 C)-100.3 F (37.9 C)] 98.2 F (36.8 C) (06/28 0933) Pulse Rate:  [96-115] 101 (06/28 0933) Resp:  [15-18] 18 (06/28 0933) BP: (133-154)/(63-73) 133/71 (06/28 0933) SpO2:  [88 %-100 %] 88 % (06/28 0933) Last BM Date: 08/07/20  Intake/Output from previous day: 06/27 0701 - 06/28 0700 In: 1608.3 [I.V.:860.6; Blood:422; IV Piggyback:325.7] Out: 450 [Urine:450] Intake/Output this shift: No intake/output data recorded.  PE: Gen:  Alert, NAD, pleasant Card: Tachycardic with regular rhythm Pulm:  CTAB, no W/R/R, effort normal. On RA.  Abd: Soft, mild epigastric tenderness that appears appropriate in the postoperative setting. Otherwise NT. No peritonitis. Laparoscopic incisions with Tegaderm dressing and gauze in place.  The epigastric incision has a small amount of dried blood on gauze and otherwise incisions are c/d/I. Hypoactive bowel sounds. Noted prior midline wound that is well healed. NGT in place on LIWS. Ext:  No LE edema or calf tenderness Psych: A&Ox3 Skin: no rashes noted, warm and dry  Lab Results:  Recent Labs    08/11/20 1248 08/11/20 2336 08/12/20 0407  WBC 8.8  --  7.8  HGB 6.9* 7.7* 7.7*  HCT 20.7* 22.6* 23.2*  PLT 178  --  160   BMET Recent Labs    08/11/20 0419 08/12/20 0407  NA 139 142  K 3.4* 3.7  CL 107 111  CO2 25 25  GLUCOSE 155* 120*  BUN 23 16  CREATININE 0.79 0.65  CALCIUM 9.0 8.9   PT/INR No results for input(s): LABPROT, INR in the last 72 hours. CMP     Component Value Date/Time   NA 142 08/12/2020 0407   K 3.7 08/12/2020  0407   CL 111 08/12/2020 0407   CO2 25 08/12/2020 0407   GLUCOSE 120 (H) 08/12/2020 0407   BUN 16 08/12/2020 0407   CREATININE 0.65 08/12/2020 0407   CALCIUM 8.9 08/12/2020 0407   PROT 5.3 (L) 08/10/2020 0413   ALBUMIN 2.6 (L) 08/10/2020 0413   ALBUMIN 2.6 (L) 08/10/2020 0413   AST 90 (H) 08/10/2020 0413   ALT 87 (H) 08/10/2020 0413   ALKPHOS 70 08/10/2020 0413   BILITOT 0.6 08/10/2020 0413   GFRNONAA >60 08/12/2020 0407   Lipase     Component Value Date/Time   LIPASE 46 08/08/2020 2224       Studies/Results: No results found.  Anti-infectives: Anti-infectives (From admission, onward)    Start     Dose/Rate Route Frequency Ordered Stop   08/09/20 1000  piperacillin-tazobactam (ZOSYN) IVPB 3.375 g        3.375 g 12.5 mL/hr over 240 Minutes Intravenous Every 8 hours 08/09/20 0407     08/09/20 0600  cefoTEtan (CEFOTAN) 2 g in sodium chloride 0.9 % 100 mL IVPB        2 g 200 mL/hr over 30 Minutes Intravenous On call to O.R. 08/09/20 0418 08/09/20 0659   08/09/20 0300  piperacillin-tazobactam (ZOSYN) IVPB 3.375 g        3.375 g 100 mL/hr over 30 Minutes Intravenous  Once 08/09/20 0253 08/09/20 0410  Assessment/Plan POD 3 s/p diagnostic laparoscopy, LOA, omental Graham patch of perforated ulcer, core liver biopsy for perforated stomach ulcer, liver masses and history of colon cancer-Dr. Gross-08/09/20 - Keep NGT in place. Plan UGI POD 4-5 - H. Pylori positive - spoke with pharmacy who will give recs on medication selection - Cont IV abx x 5 days - PPI gtt - Mobilize. HH PT  - Path pending. - Pulm toilet.  - AM labs - Will need outpatient GI f/u for endoscopy to ensure ulcer has healed in 6-8 weeks   FEN - NPO, NGT, IVF VTE - SCDs ID - Zosyn Foley - out Follow-Up - Dr. Johney Maine   Appreciate TRH's assistance Hep C Ab reactive - GI consult? ABL anemia - hgb 6.9 > 1U PRBC > 7.7 > 7.7. Stable. Am labs Hypoxia - Sp02 89% overnight. Currently on RA currently  without hypoxia. Lungs CTA b/l. No CP or SOB. IS. Check CXR. Hx of colon cancer - bx pending AKI on CKD3 - AKI resolved DM2 HTN   LOS: 3 days    [name] [name] , [facility] 08/12/2020, 10:23 AM Please see Amion for pager number during day hours 7:00am-4:30pm

## 2020-08-12 NOTE — Progress Notes (Signed)
PROGRESS NOTE    [name] [name] [place]  ZCH:[id number] DOB: 08-15-1945 DOA: 08/08/2020 PCP: Pcp, No   Chief Complain: Abdominal pain  Brief Narrative: Patient is a 75 year old female with history of diabetes type 2, hypertension, recently relocated from [place] who presented to the emergency department with complaints of abdominal pain.  CT abdomen on presentation showed gas in the upper abdomen with some inflammation at the pylorus/duodenal region suspicious for perforated ulcer.  Patient was admitted under general surgery service and she underwent omental Graham's patch on 08/09/2020.  Awaiting return of bowel function.  We are consulting.  Assessment & Plan:   Principal Problem:   Perforated gastric ulcer s/p lap omental Phillip Heal patch 08/09/2020 Active Problems:   Pneumoperitoneum   History of colorectal cancer   Liver mass, left lobe   Insulin-requiring or dependent type II diabetes mellitus (HCC)   HTN (hypertension)   Constipation, chronic   Hyperglycemia   Perforated gastric ulcer: Presented with abdominal pain.  Found to have perforated gastric ulcer.  General surgery did laparoscopic omental Graham's patch on 07/2470.  Currently on IV PPI, IV antibiotics( zosyn), IV hydration.  Currently NPO.  Management as per general surgery.  Has NG tube. General surgery recommending outpatient GI follow-up for endoscopy in 6-8 weeks H. pylori antibody positive.  General surgery planning to start on H pylori regimen  Liver mass/history of colon cancer: Has some elevated LFTs.  Status post core biopsy of the liver mass.  Pathology is pending.  Normocytic anemia/acute blood loss anemia: Most likely secondary to acute blood loss from surgery.  Hemoglobin in the range of 7 today.  We recommend blood transfusion if hemoglobin drops less than 7.  Hyponatremia/hypokalemia/hypomagnesemia: Continue to monitor and supplement  AKI on CKD stage II: AKI resolved with iv fluids  Diabetes type 2:  Hemoglobin A1c of 5.8.  Currently n.p.o., on sliding scale insulin  Hypertension: On nifedipine, Cozaar/hydrochlorothiazide at home.  Oral medications on hold.  Glaucoma: Continue home eyedrops            DVT prophylaxis:SCD Code Status: Full   Procedures:As above  Antimicrobials:  Anti-infectives (From admission, onward)    Start     Dose/Rate Route Frequency Ordered Stop   08/09/20 1000  piperacillin-tazobactam (ZOSYN) IVPB 3.375 g        3.375 g 12.5 mL/hr over 240 Minutes Intravenous Every 8 hours 08/09/20 0407     08/09/20 0600  cefoTEtan (CEFOTAN) 2 g in sodium chloride 0.9 % 100 mL IVPB        2 g 200 mL/hr over 30 Minutes Intravenous On call to O.R. 08/09/20 0418 08/09/20 0659   08/09/20 0300  piperacillin-tazobactam (ZOSYN) IVPB 3.375 g        3.375 g 100 mL/hr over 30 Minutes Intravenous  Once 08/09/20 0253 08/09/20 0410       Subjective:  Patient seen and examined at the bedside this morning.  Abdomen pain is better today.  She did not have any new complaints.  Daughter at the bedside.  No bowel movement  yet.  Objective: Vitals:   08/11/20 1808 08/11/20 2137 08/12/20 0227 08/12/20 0504  BP: (!) 146/66 (!) 144/65 (!) 154/71 (!) 153/73  Pulse: (!) 111 (!) 105 96 (!) 102  Resp: 16 15 18 18   Temp: 100.3 F (37.9 C) 99.5 F (37.5 C) 99.6 F (37.6 C) 99.7 F (37.6 C)  TempSrc: Oral Oral Oral Oral  SpO2: 100% 96% 100% 98%  Weight:  Height:        Intake/Output Summary (Last 24 hours) at 08/12/2020 0758 Last data filed at 08/12/2020 0600 Gross per 24 hour  Intake 1608.3 ml  Output 450 ml  Net 1158.3 ml   Filed Weights   08/09/20 1500  Weight: 65 kg    Examination:  General exam: Overall comfortable, not in distress HEENT: NG tube Respiratory system:  no wheezes or crackles  Cardiovascular system: S1 & S2 heard, RRR.  Gastrointestinal system: Abdomen is nondistended, soft and nontender.Sluggish bowel sounds heard Central nervous  system: Alert and oriented Extremities: No edema, no clubbing ,no cyanosis Skin: No rashes, no ulcers,no icterus      Data Reviewed: I have personally reviewed following labs and imaging studies  CBC: Recent Labs  Lab 08/08/20 2224 08/10/20 0548 08/11/20 0419 08/11/20 1248 08/11/20 2336 08/12/20 0407  WBC 9.7 9.3 9.7 8.8  --  7.8  NEUTROABS 8.6* 6.6  --   --   --   --   HGB 15.6* 7.6* 7.0* 6.9* 7.7* 7.7*  HCT 46.3* 22.2* 21.0* 20.7* 22.6* 23.2*  MCV 91.5 91.7 92.9 94.1  --  92.4  PLT 248 189 190 178  --  782   Basic Metabolic Panel: Recent Labs  Lab 08/08/20 2224 08/10/20 0413 08/11/20 0419 08/12/20 0407  NA 132* 137  135 139 142  K 3.2* 3.8  3.7 3.4* 3.7  CL 98 104  105 107 111  CO2 24 25  24 25 25   GLUCOSE 118* 213*  212* 155* 120*  BUN 22 37*  38* 23 16  CREATININE 0.71 1.41*  1.50* 0.79 0.65  CALCIUM 10.2 8.5*  8.3* 9.0 8.9  MG  --  1.6* 2.5*  --   PHOS  --  2.5  2.5  --   --    GFR: Estimated Creatinine Clearance: 53.3 mL/min (by C-G formula based on SCr of 0.65 mg/dL). Liver Function Tests: Recent Labs  Lab 08/08/20 2224 08/10/20 0413  AST 43* 90*  ALT 63* 87*  ALKPHOS 157* 70  BILITOT 1.1 0.6  PROT 7.9 5.3*  ALBUMIN 3.9 2.6*  2.6*   Recent Labs  Lab 08/08/20 2224  LIPASE 46   No results for input(s): AMMONIA in the last 168 hours. Coagulation Profile: No results for input(s): INR, PROTIME in the last 168 hours. Cardiac Enzymes: No results for input(s): CKTOTAL, CKMB, CKMBINDEX, TROPONINI in the last 168 hours. BNP (last 3 results) No results for input(s): PROBNP in the last 8760 hours. HbA1C: No results for input(s): HGBA1C in the last 72 hours.  CBG: Recent Labs  Lab 08/11/20 2030 08/11/20 2334 08/12/20 0224 08/12/20 0549 08/12/20 0729  GLUCAP 134* 131* 103* 126* 131*   Lipid Profile: No results for input(s): CHOL, HDL, LDLCALC, TRIG, CHOLHDL, LDLDIRECT in the last 72 hours. Thyroid Function Tests: No results for  input(s): TSH, T4TOTAL, FREET4, T3FREE, THYROIDAB in the last 72 hours. Anemia Panel: No results for input(s): VITAMINB12, FOLATE, FERRITIN, TIBC, IRON, RETICCTPCT in the last 72 hours. Sepsis Labs: No results for input(s): PROCALCITON, LATICACIDVEN in the last 168 hours.  Recent Results (from the past 240 hour(s))  Resp Panel by RT-PCR (Flu A&B, Covid) Nasopharyngeal Swab     Status: None   Collection Time: 08/08/20 11:35 PM   Specimen: Nasopharyngeal Swab; Nasopharyngeal(NP) swabs in vial transport medium  Result Value Ref Range Status   SARS Coronavirus 2 by RT PCR NEGATIVE NEGATIVE Final    Comment: (NOTE) SARS-CoV-2 target nucleic  acids are NOT DETECTED.  The SARS-CoV-2 RNA is generally detectable in upper respiratory specimens during the acute phase of infection. The lowest concentration of SARS-CoV-2 viral copies this assay can detect is 138 copies/mL. A negative result does not preclude SARS-Cov-2 infection and should not be used as the sole basis for treatment or other patient management decisions. A negative result may occur with  improper specimen collection/handling, submission of specimen other than nasopharyngeal swab, presence of viral mutation(s) within the areas targeted by this assay, and inadequate number of viral copies(<138 copies/mL). A negative result must be combined with clinical observations, patient history, and epidemiological information. The expected result is Negative.  Fact Sheet for Patients:  EntrepreneurPulse.com.au  Fact Sheet for Healthcare Providers:  IncredibleEmployment.be  This test is no t yet approved or cleared by the [place] FDA and  has been authorized for detection and/or diagnosis of SARS-CoV-2 by FDA under an Emergency Use Authorization (EUA). This EUA will remain  in effect (meaning this test can be used) for the duration of the COVID-19 declaration under Section 564(b)(1) of the Act,  21 U.S.C.section 360bbb-3(b)(1), unless the authorization is terminated  or revoked sooner.       Influenza A by PCR NEGATIVE NEGATIVE Final   Influenza B by PCR NEGATIVE NEGATIVE Final    Comment: (NOTE) The Xpert Xpress SARS-CoV-2/FLU/RSV plus assay is intended as an aid in the diagnosis of influenza from Nasopharyngeal swab specimens and should not be used as a sole basis for treatment. Nasal washings and aspirates are unacceptable for Xpert Xpress SARS-CoV-2/FLU/RSV testing.  Fact Sheet for Patients: EntrepreneurPulse.com.au  Fact Sheet for Healthcare Providers: IncredibleEmployment.be  This test is not yet approved or cleared by the [place] FDA and has been authorized for detection and/or diagnosis of SARS-CoV-2 by FDA under an Emergency Use Authorization (EUA). This EUA will remain in effect (meaning this test can be used) for the duration of the COVID-19 declaration under Section 564(b)(1) of the Act, 21 U.S.C. section 360bbb-3(b)(1), unless the authorization is terminated or revoked.  Performed at [facility], [place] [address], [place], [place] [address]          Radiology Studies: No results found.      Scheduled Meds:  sodium chloride   Intravenous Once   bisacodyl  10 mg Rectal Daily   Chlorhexidine Gluconate Cloth  6 each Topical Daily   insulin aspart  0-15 Units Subcutaneous Q4H   lip balm  1 application Topical BID   pantoprazole  40 mg Intravenous Q12H   pneumococcal 23 valent vaccine  0.5 mL Intramuscular Tomorrow-1000   Continuous Infusions:  sodium chloride 10 mL/hr at 08/10/20 1800   albumin human     lactated ringers 50 mL/hr at 08/12/20 0242   methocarbamol (ROBAXIN) IV     ondansetron (ZOFRAN) IV     pantoprazole 8 mg/hr (08/12/20 0120)   piperacillin-tazobactam (ZOSYN)  IV 3.375 g (08/12/20 0537)     LOS: 3 days    Time spent: 25 mins.More than 50% of that time was  spent in counseling and/or coordination of care.      Shelly Coss, MD [facility] P6/28/2022, 7:58 AM

## 2020-08-12 NOTE — Evaluation (Signed)
Occupational Therapy Evaluation Patient Details Name: [name] [name] MRN: [id number] DOB: 1945-10-22 Today's Date: 08/12/2020    History of Present Illness patient is a 32 female who recently traveld from [place] to visit daughter. patient presented to the hospital with abdominal pain. CT revealed inflammation at pylorus/duodenal region suspicion for perforated ulcer and noted to have mass on left lateral sector of the liver. on 6/25 patient underwent laparoscopic repair of perforated ulcer and lysis of adhesions. PMH is significant for glaucoma, HTN, and DM.   Clinical Impression   Patient is a 75 year old female with above diagnosis who was noted to have had a decline in the ability to participate in ADLs and functional mobility at prior level of function leading to decreased independence and increased caregiver burden. Patient was independent in ADLs and completing functional mobility without an assistive device prior to recent onset of illness. Currently, patient required min A for supine to sit on edge of bed with education on proper hand and foot placement. Patient was min guard for functional mobility with one noted loss of balance with min A to maintain standing balance when transitioning out of bathroom. Patient was min guard for toileting hygiene and washing hands at sink. Patient required mod A for doffing socks on R side with noted facial grimacing. Patient could benefit from skilled OT services to mitigate barriers for patient to achieve prior level of function and transition back home with daughter.   Follow Up Recommendations  Supervision - Intermittent;No OT follow up    Equipment Recommendations  Other (comment) (AE as needed)    Recommendations for Other Services       Precautions / Restrictions Precautions Precautions: Fall Restrictions Other Position/Activity Restrictions: NG tube, abdominal incisions-monitor gaitbelt placement      Mobility Bed  Mobility Overal bed mobility: Needs Assistance Bed Mobility: Supine to Sit     Supine to sit: Min assist     General bed mobility comments: patient required cues for proper hand and foot placement to complete task.    Transfers Overall transfer level: Needs assistance Equipment used: None Transfers: Sit to/from Stand Sit to Stand: Min guard              Balance Overall balance assessment: Needs assistance Sitting-balance support: No upper extremity supported Sitting balance-Leahy Scale: Good     Standing balance support: No upper extremity supported Standing balance-Leahy Scale: Fair                 High Level Balance Comments: patient was noted to have one slight loss of balance while standing with no UE support posterioly with min A to regain standing balance.           ADL either performed or assessed with clinical judgement   ADL Overall ADL's : Needs assistance/impaired Eating/Feeding: Modified independent   Grooming: Supervision/safety               Lower Body Dressing: Moderate assistance Lower Body Dressing Details (indicate cue type and reason): patient was noted to have increased grimacing with bending for donning/doffing socks with mod A to doff R sock at end of session. Toilet Transfer: Designer, fashion/clothing and Hygiene: Min guard       Functional mobility during ADLs: Min guard General ADL Comments: patient requested to walk without RW and was noted to furniture walk to bathroom.     Vision   Vision Assessment?: No apparent visual deficits  Perception     Praxis      Pertinent Vitals/Pain Pain Assessment: Faces Faces Pain Scale: Hurts a little bit Pain Location: patient reported headache, abdominal area on L lateral quadrant, and R shoulder Pain Descriptors / Indicators: Constant;Discomfort Pain Intervention(s): Patient requesting pain meds-RN notified;RN gave pain meds during session     Hand  Dominance Right   Extremity/Trunk Assessment Upper Extremity Assessment Upper Extremity Assessment: Overall WFL for tasks assessed   Lower Extremity Assessment Lower Extremity Assessment: Defer to PT evaluation   Cervical / Trunk Assessment Cervical / Trunk Assessment: Normal   Communication Communication Communication: No difficulties (daughter did ask for questions and cues to be slower for patient to better understand.)   Cognition Arousal/Alertness: Awake/alert Behavior During Therapy: WFL for tasks assessed/performed Overall Cognitive Status: Within Functional Limits for tasks assessed                                     General Comments  patient reported having previous shoulder injury in R shoulder with occasional pain with movements. patient was also noted to have slight edema in bilateral hands with L hand on palmar surface and R hand on dorsal surface.    Exercises     Shoulder Instructions      Home Living Family/patient expects to be discharged to:: Private residence Living Arrangements: Children Available Help at Discharge: Family Type of Home: Apartment Home Access: Stairs to enter [employer] of Steps: patient reported living on third story apartment with daughter. patients daughter reported having 10 steps and 15 steps to get to apartment. daughter reported looking into getting ground level apartment at this time. Entrance Stairs-Rails: Left Home Layout: Multi-level     Bathroom Shower/Tub: Teacher, early years/pre: Standard Bathroom Accessibility: Yes   Home Equipment: None   Additional Comments: pt lives with daugher at this time. daughter reported that patient might be able to stay with daughters friend who has one flight of steps to navigate to get into place.      Prior Functioning/Environment Level of Independence: Independent        Comments: pt just moved here from [place] in last ~2 months        OT  Problem List: Decreased strength;Decreased knowledge of use of DME or AE;Pain;Impaired balance (sitting and/or standing);Decreased activity tolerance      OT Treatment/Interventions: Self-care/ADL training;Therapeutic exercise;Patient/family education;Balance training;Therapeutic activities;DME and/or AE instruction;Energy conservation    OT Goals(Current goals can be found in the care plan section) Acute Rehab OT Goals Patient Stated Goal: to get back to daughters house OT Goal Formulation: With patient/family Time For Goal Achievement: 08/26/20 Potential to Achieve Goals: Good  OT Frequency: Min 2X/week   Barriers to D/C: Inaccessible home environment (patient would need to be able to walk up a total of 20+ steps to access apartment.)          Co-evaluation              AM-PAC OT "6 Clicks" Daily Activity     Outcome Measure Help from another person eating meals?: None Help from another person taking care of personal grooming?: A Little Help from another person toileting, which includes using toliet, bedpan, or urinal?: A Little Help from another person bathing (including washing, rinsing, drying)?: A Little Help from another person to put on and taking off regular upper body clothing?: A Little Help from  another person to put on and taking off regular lower body clothing?: A Lot 6 Click Score: 18   End of Session Equipment Utilized During Treatment: Gait belt Nurse Communication: Other (comment) (nurse made aware of patients request for pain medications.)  Activity Tolerance: Patient tolerated treatment well Patient left: in bed;with call bell/phone within reach;with family/visitor present  OT Visit Diagnosis: Unsteadiness on feet (R26.81);Pain Pain - part of body:  (patients pain was on L side of abdomen, and headache.)                Time: 3005-1102 OT Time Calculation (min): 21 min Charges:  OT General Charges $OT Visit: 1 Visit OT Evaluation $OT Eval Low  Complexity: 1 Low  Leota Sauers, MS Acute Rehabilitation Department Office# [phone] Pager# [phone]   [place] 08/12/2020, 9:50 AM

## 2020-08-12 NOTE — Progress Notes (Signed)
Discussed with Alferd Apa, PA-C (CCS): H pylori Ab positive, as well as HCV Ab positive.  Recommend:  Treatment of H pylori for 14 days with clarithromycin 500 mg PO BID and amoxicillin 1000 mg PO BID.   Continue PPI BID for a minimum of 2 months, or until EGD confirms ulcer healing. HCV RNA to confirm whether or not patient has active HCV infection. If HCV is positive, please draw the following labs: HCV fibrosure, HCV genotype (non-reflex), and HBsAg for screening Our office will call patient to arrange FU visit.  Office information also listed in the AVS if patient would like to call our office.  Please contact us if we can be of any further assistance during this hospital stay.  Salley Slaughter, PA-C [facility]

## 2020-08-13 ENCOUNTER — Inpatient Hospital Stay (HOSPITAL_COMMUNITY): Payer: Medicaid Other

## 2020-08-13 LAB — GLUCOSE, CAPILLARY
Glucose-Capillary: 136 mg/dL — ABNORMAL HIGH (ref 70–99)
Glucose-Capillary: 152 mg/dL — ABNORMAL HIGH (ref 70–99)
Glucose-Capillary: 147 mg/dL — ABNORMAL HIGH (ref 70–99)
Glucose-Capillary: 146 mg/dL — ABNORMAL HIGH (ref 70–99)

## 2020-08-13 LAB — CBC
HCT: 26.1 % — ABNORMAL LOW (ref 36.0–46.0)
Hemoglobin: 8.6 g/dL — ABNORMAL LOW (ref 12.0–15.0)
MCH: 30.8 pg (ref 26.0–34.0)
MCHC: 33 g/dL (ref 30.0–36.0)
MCV: 93.5 fL (ref 80.0–100.0)
Platelets: 217 10*3/uL (ref 150–400)
RBC: 2.79 MIL/uL — ABNORMAL LOW (ref 3.87–5.11)
RDW: 14.6 % (ref 11.5–15.5)
WBC: 9.2 10*3/uL (ref 4.0–10.5)
nRBC: 1 % — ABNORMAL HIGH (ref 0.0–0.2)

## 2020-08-13 LAB — BASIC METABOLIC PANEL
Anion gap: 10 (ref 5–15)
BUN: 14 mg/dL (ref 8–23)
CO2: 22 mmol/L (ref 22–32)
Calcium: 9.3 mg/dL (ref 8.9–10.3)
Chloride: 109 mmol/L (ref 98–111)
Creatinine, Ser: 0.73 mg/dL (ref 0.44–1.00)
GFR, Estimated: >60 mL/min (ref >60)
Glucose, Bld: 142 mg/dL — ABNORMAL HIGH (ref 70–99)
Potassium: 3.2 mmol/L — ABNORMAL LOW (ref 3.5–5.1)
Sodium: 141 mmol/L (ref 135–145)

## 2020-08-13 LAB — BRAIN NATRIURETIC PEPTIDE: B Natriuretic Peptide: 349.3 pg/mL — ABNORMAL HIGH (ref 0.0–100.0)

## 2020-08-13 IMAGING — DX DG CHEST 1V PORT
1 series · 1 of 1 positions shown · non-contrast
Comparison: [DATE]

CLINICAL DATA: Pleural effusion

EXAM:
PORTABLE CHEST 1 VIEW

[chest ap]
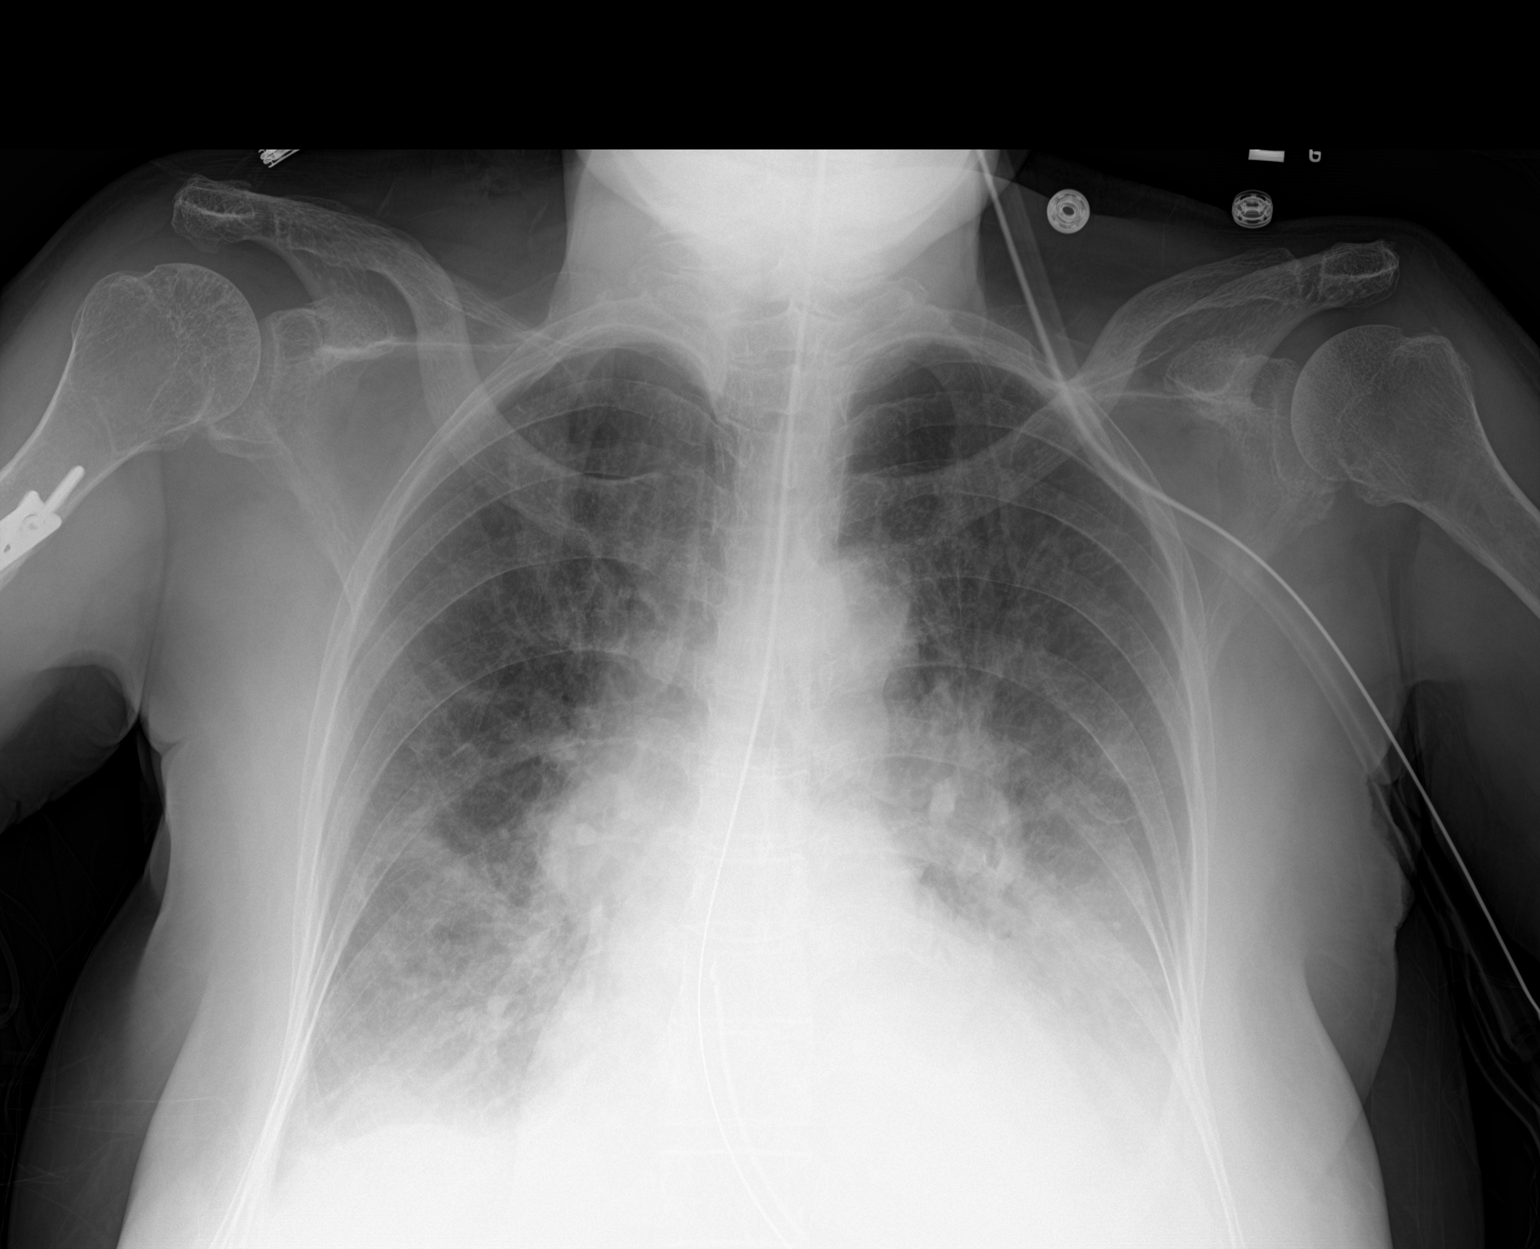

[1 of 1 positions shown; findings below may reference images not displayed]

FINDINGS: Nasogastric tube is again seen likely looped with its tip within the
distal esophagus, unchanged from prior examination. The lungs are
symmetrically expanded. Bilateral perihilar interstitial pulmonary
infiltrate has progressed in keeping with progressive mild to
moderate cardiogenic failure. Small bilateral pleural effusions are
present, left greater than right. Cardiac size within normal limits.
No acute bone abnormality.
IMPRESSION: Nasogastric tube looped with its tip within the distal esophagus.

Progressive cardiogenic failure. Small bilateral pleural effusions,
left greater than right.

## 2020-08-13 MED ORDER — CLARITHROMYCIN 250 MG PO TABS
500.0000 mg | ORAL_TABLET | Freq: Two times a day (BID) | ORAL | Status: DC
  Administered 2020-08-13 – 2020-08-18 (×10): 500 mg via ORAL
  Filled 2020-08-13 (×10): qty 2

## 2020-08-13 MED ORDER — FUROSEMIDE 10 MG/ML IJ SOLN
40.0000 mg | Freq: Once | INTRAMUSCULAR | Status: AC
  Administered 2020-08-13: 40 mg via INTRAVENOUS
  Filled 2020-08-13: qty 4

## 2020-08-13 MED ORDER — AMOXICILLIN 500 MG PO CAPS
1000.0000 mg | ORAL_CAPSULE | Freq: Two times a day (BID) | ORAL | Status: DC
  Administered 2020-08-13 – 2020-08-18 (×10): 1000 mg via ORAL
  Filled 2020-08-13 (×10): qty 2

## 2020-08-13 MED ORDER — HYDRALAZINE HCL 20 MG/ML IJ SOLN
10.0000 mg | Freq: Three times a day (TID) | INTRAMUSCULAR | Status: DC | PRN
Start: 2020-08-13 — End: 2020-08-18

## 2020-08-13 MED ORDER — STERILE WATER FOR INJECTION IJ SOLN
INTRAMUSCULAR | Status: AC
  Filled 2020-08-13: qty 10

## 2020-08-13 MED ORDER — IOHEXOL 300 MG/ML  SOLN
50.0000 mL | Freq: Once | INTRAMUSCULAR | Status: AC | PRN
  Administered 2020-08-13: 50 mL

## 2020-08-13 MED ORDER — POTASSIUM CHLORIDE 10 MEQ/100ML IV SOLN
10.0000 meq | INTRAVENOUS | Status: AC
  Administered 2020-08-13 (×3): 10 meq via INTRAVENOUS
  Filled 2020-08-13: qty 100

## 2020-08-13 MED ORDER — ENOXAPARIN SODIUM 40 MG/0.4ML IJ SOSY
40.0000 mg | PREFILLED_SYRINGE | INTRAMUSCULAR | Status: DC
  Administered 2020-08-13 – 2020-08-17 (×5): 40 mg via SUBCUTANEOUS
  Filled 2020-08-13 (×5): qty 0.4

## 2020-08-13 NOTE — Progress Notes (Signed)
4 Days Post-Op  Subjective: CC: Still having some soreness around her incisions and left upper quadrant but feels pain is well controlled..  No nausea. NGT in place.  Passing flatus.  Notes small BM yesterday.  Voiding.  Hypoxic overnight.  No chest pain or shortness of breath.  Denies lower extremity pain or swelling.  Mobilizing.  Objective: Vital signs in last 24 hours: Temp:  [97.9 F (36.6 C)-99.4 F (37.4 C)] 99.1 F (37.3 C) (06/29 0527) Pulse Rate:  [103-109] 106 (06/29 0527) Resp:  [18] 18 (06/29 0527) BP: (149-167)/(75-83) 149/75 (06/29 0527) SpO2:  [86 %-96 %] 86 % (06/29 0527) Last BM Date: 08/11/20  Intake/Output from previous day: 06/28 0701 - 06/29 0700 In: 646.1 [I.V.:550.4; IV Piggyback:95.7] Out: 500 [Urine:400; Emesis/NG output:100] Intake/Output this shift: No intake/output data recorded.  PE: Gen:  Alert, NAD, pleasant Card: Tachycardic with regular rhythm Pulm:  CTAB, no W/R/R, effort normal. On RA.  Abd: Soft, mild epigastric tenderness that appears appropriate in the postoperative setting. Otherwise NT. No peritonitis. Laparoscopic incisions with Tegaderm dressing and gauze in place.  The epigastric incision has a small amount of dried blood on gauze and otherwise incisions are c/d/I. More active bowel sounds. Noted prior midline wound that is well healed. NGT in place on LIWS with scant output. Ext:  No LE edema or calf tenderness Psych: A&Ox3 Skin: no rashes noted, warm and dry  Lab Results:  Recent Labs    08/12/20 0407 08/13/20 0433  WBC 7.8 9.2  HGB 7.7* 8.6*  HCT 23.2* 26.1*  PLT 160 217   BMET Recent Labs    08/12/20 0407 08/13/20 0433  NA 142 141  K 3.7 3.2*  CL 111 109  CO2 25 22  GLUCOSE 120* 142*  BUN 16 14  CREATININE 0.65 0.73  CALCIUM 8.9 9.3   PT/INR No results for input(s): LABPROT, INR in the last 72 hours. CMP     Component Value Date/Time   NA 141 08/13/2020 0433   K 3.2 (L) 08/13/2020 0433   CL 109  08/13/2020 0433   CO2 22 08/13/2020 0433   GLUCOSE 142 (H) 08/13/2020 0433   BUN 14 08/13/2020 0433   CREATININE 0.73 08/13/2020 0433   CALCIUM 9.3 08/13/2020 0433   PROT 5.3 (L) 08/10/2020 0413   ALBUMIN 2.6 (L) 08/10/2020 0413   ALBUMIN 2.6 (L) 08/10/2020 0413   AST 90 (H) 08/10/2020 0413   ALT 87 (H) 08/10/2020 0413   ALKPHOS 70 08/10/2020 0413   BILITOT 0.6 08/10/2020 0413   GFRNONAA >60 08/13/2020 0433   Lipase     Component Value Date/Time   LIPASE 46 08/08/2020 2224       Studies/Results: DG CHEST PORT 1 VIEW  Result Date: 08/13/2020 CLINICAL DATA:  Pleural effusion EXAM: PORTABLE CHEST 1 VIEW COMPARISON:  08/12/2020 FINDINGS: Nasogastric tube is again seen likely looped with its tip within the distal esophagus, unchanged from prior examination. The lungs are symmetrically expanded. Bilateral perihilar interstitial pulmonary infiltrate has progressed in keeping with progressive mild to moderate cardiogenic failure. Small bilateral pleural effusions are present, left greater than right. Cardiac size within normal limits. No acute bone abnormality. IMPRESSION: Nasogastric tube looped with its tip within the distal esophagus. Progressive cardiogenic failure. Small bilateral pleural effusions, left greater than right. Electronically Signed   By: Fidela Salisbury MD   On: 08/13/2020 05:08   DG CHEST PORT 1 VIEW  Result Date: 08/12/2020 CLINICAL DATA:  Hypoxia EXAM: PORTABLE  CHEST 1 VIEW COMPARISON:  No prior. FINDINGS: NG tube noted descending into stomach and turned back on itself with tip in the esophagus. Repositioning suggested. Heart size normal. Bibasilar atelectasis/infiltrates. Small to moderate left pleural effusion. No pneumothorax. IMPRESSION: 1. NG tube noted descending into the stomach and turned back on itself with tip in the esophagus. Repositioning suggested. 2. Bibasilar atelectasis/infiltrates. Small to moderate left pleural effusion. These results will be called to  the ordering clinician or representative by the Radiologist Assistant, and communication documented in the PACS or [employer]. Electronically Signed   By: Marcello Moores  Register   On: 08/12/2020 11:34    Anti-infectives: Anti-infectives (From admission, onward)    Start     Dose/Rate Route Frequency Ordered Stop   08/09/20 1000  piperacillin-tazobactam (ZOSYN) IVPB 3.375 g        3.375 g 12.5 mL/hr over 240 Minutes Intravenous Every 8 hours 08/09/20 0407     08/09/20 0600  cefoTEtan (CEFOTAN) 2 g in sodium chloride 0.9 % 100 mL IVPB        2 g 200 mL/hr over 30 Minutes Intravenous On call to O.R. 08/09/20 0418 08/09/20 0659   08/09/20 0300  piperacillin-tazobactam (ZOSYN) IVPB 3.375 g        3.375 g 100 mL/hr over 30 Minutes Intravenous  Once 08/09/20 0253 08/09/20 0410        Assessment/Plan POD 4 s/p diagnostic laparoscopy, LOA, omental Phillip Heal patch of perforated ulcer, core liver biopsy for perforated stomach ulcer, liver masses and history of colon cancer-Dr. Gross-08/09/20 - NGT in place. UGI today - H. Pylori positive - GI left recs for tx on 6/28 when taking PO - Cont IV abx till able to switch to PO regimen for H. Pylori - PPI BID - Mobilize. HH PT  - Path pending. - Pulm toilet. - AM labs - Will need outpatient GI f/u for endoscopy to ensure ulcer has healed in 6-8 weeks   FEN - NPO, NGT, IVF per [facility] (currently on 50cc/hr), replace K (3.2) VTE - SCDs, start Lovenox today (hgb stable) ID - Zosyn Foley - out Follow-Up - Dr. Johney Maine   Appreciate [facility]'s assistance Hep C Ab reactive - GI consulted. They have ordered HCV RNA. If positive recommend HCV fibrosure, HCV genotype (non-reflex), and HBsAg for screening and outpatient follow-up ABL anemia - hgb 6.9 > 1U PRBC > 7.7 > 7.7 > 8.6. AM labs  Hypoxia - SPO2 down to 86% at its lowest in the last 24 hours.  Mildly tachycardic in the low 100's.  Denies CP or SOB.  No lower extremity edema or calf pain.  CXR with progressive  cardiogenic failure.  She was given IV Lasix yesterday.  I have ordered another dose for today.  I have reached out to the hospitalist to discuss her case to determine the next steps in workup/tx.  Incentive spirometer ordered.  Continuous pulse ox and telemetry ordered. Hx of colon cancer - bx pending AKI on CKD3 - AKI resolved DM2 - SSI HTN - home meds when taking PO. PRN IV Hydralazine    LOS: 4 days    [name] , [facility] 08/13/2020, 11:20 AM Please see Amion for pager number during day hours 7:00am-4:30pm

## 2020-08-13 NOTE — Progress Notes (Signed)
PROGRESS NOTE    Nya Monds [place]  JQB:[id number] DOB: 1945-02-22 DOA: 08/08/2020 PCP: Pcp, No   Chief Complain: Abdominal pain  Brief Narrative: Patient is a 75 year old female with history of diabetes type 2, hypertension, recently relocated from [place] who presented to the emergency department with complaints of abdominal pain.  CT abdomen on presentation showed gas in the upper abdomen with some inflammation at the pylorus/duodenal region suspicious for perforated ulcer.  Patient was admitted under general surgery service and she underwent omental Graham's patch on 08/09/2020.  Awaiting return of bowel function.  We are consulting.  Assessment & Plan:   Principal Problem:   Perforated gastric ulcer s/p lap omental Phillip Heal patch 08/09/2020 Active Problems:   Pneumoperitoneum   History of colorectal cancer   Liver mass, left lobe   Insulin-requiring or dependent type II diabetes mellitus (HCC)   HTN (hypertension)   Constipation, chronic   Hyperglycemia   Perforated gastric ulcer: Presented with abdominal pain.  Found to have perforated gastric ulcer.  General surgery did laparoscopic omental Graham's patch on 07/2470.  Currently on IV PPI, IV antibiotics( zosyn), IV hydration.  Currently NPO.  Management as per general surgery.  Has NG tube. Plan for upper GI series with contrast today. General surgery recommending outpatient GI follow-up for endoscopy in 6-8 weeks H. pylori antibody positive.  General surgery planning to start on H pylori regimen  Liver mass/history of colon cancer:  Status post core biopsy of the liver mass.  Pathology is pending.  Elevated LFTs: HCV antibodies positive.  Pending HCVRNA.  GI recommended outpatient follow-up for treatment.  Pulmonary edema: Chest x-ray showed bilateral perihilar interstitial pulmonary infiltrates/small bilateral pleural effusion.  Given 2 doses of Lasix IV 40 mg once.    Elevated BNP.  Echo ordered.  She does not appear  volume overloaded during my evaluation.  She does not have peripheral edema.  Currently on room air.  Normocytic anemia/acute blood loss anemia: Most likely secondary to acute blood loss from surgery.  Hemoglobin in the range of 8 today.  We recommend blood transfusion if hemoglobin drops less than 7.  Hyponatremia/hypokalemia/hypomagnesemia: Continue to monitor and supplement  AKI on CKD stage II: AKI resolved with iv fluids  Diabetes type 2: Hemoglobin A1c of 5.8.  Currently n.p.o., on sliding scale insulin  Hypertension: On nifedipine, Cozaar/hydrochlorothiazide at home.  Oral medications on hold.  Glaucoma: Continue home eyedrops            DVT prophylaxis:SCD Code Status: Full   Procedures:As above  Antimicrobials:  Anti-infectives (From admission, onward)    Start     Dose/Rate Route Frequency Ordered Stop   08/09/20 1000  piperacillin-tazobactam (ZOSYN) IVPB 3.375 g        3.375 g 12.5 mL/hr over 240 Minutes Intravenous Every 8 hours 08/09/20 0407     08/09/20 0600  cefoTEtan (CEFOTAN) 2 g in sodium chloride 0.9 % 100 mL IVPB        2 g 200 mL/hr over 30 Minutes Intravenous On call to O.R. 08/09/20 0418 08/09/20 0659   08/09/20 0300  piperacillin-tazobactam (ZOSYN) IVPB 3.375 g        3.375 g 100 mL/hr over 30 Minutes Intravenous  Once 08/09/20 0253 08/09/20 0410       Subjective:  Patient seen and examined the bedside this morning.  On room air.  Appears little uncomfortable today but denies any abdominal pain.  No nausea or vomiting.  Objective: Vitals:   08/12/20 1430 08/12/20  1731 08/12/20 2036 08/13/20 0527  BP: (!) 167/83 (!) 151/75 (!) 160/78 (!) 149/75  Pulse: (!) 109 (!) 103 (!) 108 (!) 106  Resp: 18 18 18 18   Temp: 98.6 F (37 C) 97.9 F (36.6 C) 99.4 F (37.4 C) 99.1 F (37.3 C)  TempSrc: Oral Oral Oral Oral  SpO2: (!) 86%  96% (!) 86%  Weight:      Height:        Intake/Output Summary (Last 24 hours) at 08/13/2020 7425 Last data  filed at 08/13/2020 0600 Gross per 24 hour  Intake 646.09 ml  Output 500 ml  Net 146.09 ml   Filed Weights   08/09/20 1500  Weight: 65 kg    Examination:  General exam: Overall comfortable, not in distress HEENT: PERRL Respiratory system:  no wheezes or crackles  Cardiovascular system: S1 & S2 heard, RRR.  Gastrointestinal system: Abdomen is nondistended, soft and nontender.BS heard Central nervous system: Alert and oriented Extremities: No edema, no clubbing ,no cyanosis Skin: No rashes, no ulcers,no icterus      Data Reviewed: I have personally reviewed following labs and imaging studies  CBC: Recent Labs  Lab 08/08/20 2224 08/10/20 0548 08/11/20 0419 08/11/20 1248 08/11/20 2336 08/12/20 0407 08/13/20 0433  WBC 9.7 9.3 9.7 8.8  --  7.8 9.2  NEUTROABS 8.6* 6.6  --   --   --   --   --   HGB 15.6* 7.6* 7.0* 6.9* 7.7* 7.7* 8.6*  HCT 46.3* 22.2* 21.0* 20.7* 22.6* 23.2* 26.1*  MCV 91.5 91.7 92.9 94.1  --  92.4 93.5  PLT 248 189 190 178  --  160 956   Basic Metabolic Panel: Recent Labs  Lab 08/08/20 2224 08/10/20 0413 08/11/20 0419 08/12/20 0407 08/13/20 0433  NA 132* 137  135 139 142 141  K 3.2* 3.8  3.7 3.4* 3.7 3.2*  CL 98 104  105 107 111 109  CO2 24 25  24 25 25 22   GLUCOSE 118* 213*  212* 155* 120* 142*  BUN 22 37*  38* 23 16 14   CREATININE 0.71 1.41*  1.50* 0.79 0.65 0.73  CALCIUM 10.2 8.5*  8.3* 9.0 8.9 9.3  MG  --  1.6* 2.5*  --   --   PHOS  --  2.5  2.5  --   --   --    GFR: Estimated Creatinine Clearance: 53.3 mL/min (by C-G formula based on SCr of 0.73 mg/dL). Liver Function Tests: Recent Labs  Lab 08/08/20 2224 08/10/20 0413  AST 43* 90*  ALT 63* 87*  ALKPHOS 157* 70  BILITOT 1.1 0.6  PROT 7.9 5.3*  ALBUMIN 3.9 2.6*  2.6*   Recent Labs  Lab 08/08/20 2224  LIPASE 46   No results for input(s): AMMONIA in the last 168 hours. Coagulation Profile: No results for input(s): INR, PROTIME in the last 168 hours. Cardiac  Enzymes: No results for input(s): CKTOTAL, CKMB, CKMBINDEX, TROPONINI in the last 168 hours. BNP (last 3 results) No results for input(s): PROBNP in the last 8760 hours. HbA1C: No results for input(s): HGBA1C in the last 72 hours.  CBG: Recent Labs  Lab 08/12/20 1150 08/12/20 1529 08/12/20 2013 08/12/20 2345 08/13/20 0431  GLUCAP 115* 132* 158* 127* 136*   Lipid Profile: No results for input(s): CHOL, HDL, LDLCALC, TRIG, CHOLHDL, LDLDIRECT in the last 72 hours. Thyroid Function Tests: No results for input(s): TSH, T4TOTAL, FREET4, T3FREE, THYROIDAB in the last 72 hours. Anemia Panel: No results  for input(s): VITAMINB12, FOLATE, FERRITIN, TIBC, IRON, RETICCTPCT in the last 72 hours. Sepsis Labs: No results for input(s): PROCALCITON, LATICACIDVEN in the last 168 hours.  Recent Results (from the past 240 hour(s))  Resp Panel by RT-PCR (Flu A&B, Covid) Nasopharyngeal Swab     Status: None   Collection Time: 08/08/20 11:35 PM   Specimen: Nasopharyngeal Swab; Nasopharyngeal(NP) swabs in vial transport medium  Result Value Ref Range Status   SARS Coronavirus 2 by RT PCR NEGATIVE NEGATIVE Final    Comment: (NOTE) SARS-CoV-2 target nucleic acids are NOT DETECTED.  The SARS-CoV-2 RNA is generally detectable in upper respiratory specimens during the acute phase of infection. The lowest concentration of SARS-CoV-2 viral copies this assay can detect is 138 copies/mL. A negative result does not preclude SARS-Cov-2 infection and should not be used as the sole basis for treatment or other patient management decisions. A negative result may occur with  improper specimen collection/handling, submission of specimen other than nasopharyngeal swab, presence of viral mutation(s) within the areas targeted by this assay, and inadequate number of viral copies(<138 copies/mL). A negative result must be combined with clinical observations, patient history, and epidemiological information. The  expected result is Negative.  Fact Sheet for Patients:  EntrepreneurPulse.com.au  Fact Sheet for Healthcare Providers:  IncredibleEmployment.be  This test is no t yet approved or cleared by the [place] FDA and  has been authorized for detection and/or diagnosis of SARS-CoV-2 by FDA under an Emergency Use Authorization (EUA). This EUA will remain  in effect (meaning this test can be used) for the duration of the COVID-19 declaration under Section 564(b)(1) of the Act, 21 U.S.C.section 360bbb-3(b)(1), unless the authorization is terminated  or revoked sooner.       Influenza A by PCR NEGATIVE NEGATIVE Final   Influenza B by PCR NEGATIVE NEGATIVE Final    Comment: (NOTE) The Xpert Xpress SARS-CoV-2/FLU/RSV plus assay is intended as an aid in the diagnosis of influenza from Nasopharyngeal swab specimens and should not be used as a sole basis for treatment. Nasal washings and aspirates are unacceptable for Xpert Xpress SARS-CoV-2/FLU/RSV testing.  Fact Sheet for Patients: EntrepreneurPulse.com.au  Fact Sheet for Healthcare Providers: IncredibleEmployment.be  This test is not yet approved or cleared by the [place] FDA and has been authorized for detection and/or diagnosis of SARS-CoV-2 by FDA under an Emergency Use Authorization (EUA). This EUA will remain in effect (meaning this test can be used) for the duration of the COVID-19 declaration under Section 564(b)(1) of the Act, 21 U.S.C. section 360bbb-3(b)(1), unless the authorization is terminated or revoked.  Performed at [facility], [place] [address] [place],  [address]          Radiology Studies: DG CHEST PORT 1 VIEW  Result Date: 08/13/2020 CLINICAL DATA:  Pleural effusion EXAM: PORTABLE CHEST 1 VIEW COMPARISON:  08/12/2020 FINDINGS: Nasogastric tube is again seen likely looped with its tip within the distal  esophagus, unchanged from prior examination. The lungs are symmetrically expanded. Bilateral perihilar interstitial pulmonary infiltrate has progressed in keeping with progressive mild to moderate cardiogenic failure. Small bilateral pleural effusions are present, left greater than right. Cardiac size within normal limits. No acute bone abnormality. IMPRESSION: Nasogastric tube looped with its tip within the distal esophagus. Progressive cardiogenic failure. Small bilateral pleural effusions, left greater than right. Electronically Signed   By: Fidela Salisbury MD   On: 08/13/2020 05:08   DG CHEST PORT 1 VIEW  Result Date: 08/12/2020 CLINICAL DATA:  Hypoxia EXAM: PORTABLE CHEST 1 VIEW COMPARISON:  No prior. FINDINGS: NG tube noted descending into stomach and turned back on itself with tip in the esophagus. Repositioning suggested. Heart size normal. Bibasilar atelectasis/infiltrates. Small to moderate left pleural effusion. No pneumothorax. IMPRESSION: 1. NG tube noted descending into the stomach and turned back on itself with tip in the esophagus. Repositioning suggested. 2. Bibasilar atelectasis/infiltrates. Small to moderate left pleural effusion. These results will be called to the ordering clinician or representative by the Radiologist Assistant, and communication documented in the PACS or [employer]. Electronically Signed   By: Marcello Moores  Register   On: 08/12/2020 11:34        Scheduled Meds:  sodium chloride   Intravenous Once   bisacodyl  10 mg Rectal Daily   Chlorhexidine Gluconate Cloth  6 each Topical Daily   furosemide  40 mg Intravenous Once   insulin aspart  0-15 Units Subcutaneous Q4H   lip balm  1 application Topical BID   pantoprazole  40 mg Intravenous Q12H   pneumococcal 23 valent vaccine  0.5 mL Intramuscular Tomorrow-1000   Continuous Infusions:  sodium chloride 10 mL/hr at 08/10/20 1800   lactated ringers 50 mL/hr at 08/13/20 0232   methocarbamol (ROBAXIN) IV      ondansetron (ZOFRAN) IV     piperacillin-tazobactam (ZOSYN)  IV 3.375 g (08/13/20 0231)   potassium chloride       LOS: 4 days    Time spent: 25 mins.More than 50% of that time was spent in counseling and/or coordination of care.      Shelly Coss, MD [facility] P6/29/2022, 8:28 AM

## 2020-08-13 NOTE — Progress Notes (Signed)
UGI negative for leak. D/c NGT. Start CLD. D/c Zosyn and start H. Pylori tx as outlined by GI recs. Cont PPI BID.   Alferd Apa, [facility]  08/13/20, 4:28 PM

## 2020-08-13 NOTE — Progress Notes (Signed)
Physical Therapy Treatment Patient Details Name: [name] [name] MRN: [id number] DOB: November 18, 1945 Today's Date: 08/13/2020    History of Present Illness patient is a 89 female who recently traveld from [place] to visit daughter. patient presented to the hospital with abdominal pain. CT revealed inflammation at pylorus/duodenal region suspicion for perforated ulcer and noted to have mass on left lateral sector of the liver. on 6/25 patient underwent laparoscopic repair of perforated ulcer and lysis of adhesions. PMH is significant for glaucoma, HTN, and DM.    PT Comments    Pt sitting in recliner on arrival.  Currently has NG tube in place to suction.  Pt assisted with ambulating in hallway and SPO2 dropped to 86% on room air so reapplied 2L O2 [place] upon returning to room (RN aware).    Follow Up Recommendations  Home health PT     Equipment Recommendations  Rolling walker with 5" wheels    Recommendations for Other Services       Precautions / Restrictions Precautions Precautions: Fall Precaution Comments: NG tube, abdominal incisions, monitor sats    Mobility  Bed Mobility               General bed mobility comments: pt in recliner on arrival    Transfers Overall transfer level: Needs assistance Equipment used: None Transfers: Sit to/from Stand Sit to Stand: Min guard         General transfer comment: verbal cues for hand placement  Ambulation/Gait Ambulation/Gait assistance: Min assist;Min guard Gait Distance (Feet): 300 Feet Assistive device: IV Pole Gait Pattern/deviations: Step-through pattern;Decreased stride length;Drifts right/left     General Gait Details: cues for using IV pole for more support, would benefit from using RW with nursing staff, drifts to right and occasoinal LOB to right requiring light assist to correct, SPO2 dropped to 86% on room air upon returning to room so reapplied 2L O2 [place] and RN aware   Stairs              Wheelchair Mobility    Modified Rankin (Stroke Patients Only)       Balance           Standing balance support: No upper extremity supported Standing balance-Leahy Scale: Fair Standing balance comment: static fair, dynamic poor                            Cognition Arousal/Alertness: Awake/alert Behavior During Therapy: WFL for tasks assessed/performed Overall Cognitive Status: Within Functional Limits for tasks assessed                                        Exercises      General Comments        Pertinent Vitals/Pain Pain Assessment: No/denies pain Pain Intervention(s): Monitored during session;Repositioned    Home Living                      Prior Function            PT Goals (current goals can now be found in the care plan section) Progress towards PT goals: Progressing toward goals    Frequency    Min 3X/week      PT Plan Current plan remains appropriate;Equipment recommendations need to be updated    Co-evaluation  AM-PAC PT "6 Clicks" Mobility   Outcome Measure  Help needed turning from your back to your side while in a flat bed without using bedrails?: A Little Help needed moving from lying on your back to sitting on the side of a flat bed without using bedrails?: A Little Help needed moving to and from a bed to a chair (including a wheelchair)?: A Little Help needed standing up from a chair using your arms (e.g., wheelchair or bedside chair)?: A Little Help needed to walk in hospital room?: A Little Help needed climbing 3-5 steps with a railing? : A Little 6 Click Score: 18    End of Session Equipment Utilized During Treatment: Gait belt;Oxygen Activity Tolerance: Patient tolerated treatment well Patient left: in chair;with call bell/phone within reach;with family/visitor present;with chair alarm set Nurse Communication: Mobility status PT Visit Diagnosis: Other abnormalities  of gait and mobility (R26.89);Difficulty in walking, not elsewhere classified (R26.2)     Time: 1610-9604 PT Time Calculation (min) (ACUTE ONLY): 18 min  Charges:  $Gait Training: 8-22 mins                     Jannette Spanner PT, DPT Acute Rehabilitation Services Pager: [phone] Office: [phone]   Trena Platt 08/13/2020, 2:37 PM

## 2020-08-14 ENCOUNTER — Inpatient Hospital Stay (HOSPITAL_COMMUNITY): Payer: Medicaid Other

## 2020-08-14 DIAGNOSIS — R0609 Other forms of dyspnea: Secondary | ICD-10-CM

## 2020-08-14 DIAGNOSIS — C220 Liver cell carcinoma: Secondary | ICD-10-CM

## 2020-08-14 LAB — GLUCOSE, CAPILLARY
Glucose-Capillary: 136 mg/dL — ABNORMAL HIGH (ref 70–99)
Glucose-Capillary: 124 mg/dL — ABNORMAL HIGH (ref 70–99)
Glucose-Capillary: 151 mg/dL — ABNORMAL HIGH (ref 70–99)
Glucose-Capillary: 189 mg/dL — ABNORMAL HIGH (ref 70–99)
Glucose-Capillary: 226 mg/dL — ABNORMAL HIGH (ref 70–99)

## 2020-08-14 LAB — HCV RNA DIAGNOSIS, NAA
HCV RNA, Quantitation: 365000 IU/mL
HCV RNA, log10: 5.562 log10 IU/mL

## 2020-08-14 LAB — CBC WITH DIFFERENTIAL/PLATELET
Abs Immature Granulocytes: 0.12 10*3/uL — ABNORMAL HIGH (ref 0.00–0.07)
Basophils Absolute: 0 10*3/uL (ref 0.0–0.1)
Basophils Relative: 0 %
Eosinophils Absolute: 0.1 10*3/uL (ref 0.0–0.5)
Eosinophils Relative: 1 %
HCT: 24.3 % — ABNORMAL LOW (ref 36.0–46.0)
Hemoglobin: 8.1 g/dL — ABNORMAL LOW (ref 12.0–15.0)
Immature Granulocytes: 1 %
Lymphocytes Relative: 23 %
Lymphs Abs: 2.2 10*3/uL (ref 0.7–4.0)
MCH: 30.7 pg (ref 26.0–34.0)
MCHC: 33.3 g/dL (ref 30.0–36.0)
MCV: 92 fL (ref 80.0–100.0)
Monocytes Absolute: 1 10*3/uL (ref 0.1–1.0)
Monocytes Relative: 10 %
Neutro Abs: 6.4 10*3/uL (ref 1.7–7.7)
Neutrophils Relative %: 65 %
Platelets: 227 10*3/uL (ref 150–400)
RBC: 2.64 MIL/uL — ABNORMAL LOW (ref 3.87–5.11)
RDW: 14.6 % (ref 11.5–15.5)
WBC: 9.8 10*3/uL (ref 4.0–10.5)
nRBC: 1.5 % — ABNORMAL HIGH (ref 0.0–0.2)

## 2020-08-14 LAB — ECHOCARDIOGRAM COMPLETE
AR max vel: 2.97 cm2
AV Area VTI: 3.03 cm2
AV Area mean vel: 2.9 cm2
AV Mean grad: 6 mmHg
AV Peak grad: 10.4 mmHg
Ao pk vel: 1.61 m/s
Area-P 1/2: 4.57 cm2
Calc EF: 62.9 %
Height: 64 in
MV M vel: 5.32 m/s
MV Peak grad: 113.2 mmHg
Radius: 0.3 cm
S' Lateral: 2.3 cm
Single Plane A2C EF: 43 %
Single Plane A4C EF: 73.4 %
Weight: 2292.78 oz

## 2020-08-14 LAB — BASIC METABOLIC PANEL
Anion gap: 7 (ref 5–15)
BUN: 16 mg/dL (ref 8–23)
CO2: 26 mmol/L (ref 22–32)
Calcium: 9.1 mg/dL (ref 8.9–10.3)
Chloride: 107 mmol/L (ref 98–111)
Creatinine, Ser: 0.57 mg/dL (ref 0.44–1.00)
GFR, Estimated: >60 mL/min (ref >60)
Glucose, Bld: 124 mg/dL — ABNORMAL HIGH (ref 70–99)
Potassium: 3.2 mmol/L — ABNORMAL LOW (ref 3.5–5.1)
Sodium: 140 mmol/L (ref 135–145)

## 2020-08-14 LAB — SURGICAL PATHOLOGY

## 2020-08-14 MED ORDER — OXYCODONE HCL 5 MG PO TABS
5.0000 mg | ORAL_TABLET | ORAL | Status: DC | PRN
  Administered 2020-08-14: 10 mg via ORAL
  Administered 2020-08-15: 5 mg via ORAL
  Administered 2020-08-16: 10 mg via ORAL
  Filled 2020-08-14 (×2): qty 2
  Filled 2020-08-14: qty 1

## 2020-08-14 MED ORDER — POTASSIUM CHLORIDE CRYS ER 20 MEQ PO TBCR
40.0000 meq | EXTENDED_RELEASE_TABLET | Freq: Once | ORAL | Status: AC
  Administered 2020-08-14: 40 meq via ORAL
  Filled 2020-08-14: qty 2

## 2020-08-14 NOTE — Progress Notes (Signed)
PROGRESS NOTE    [name] [name]  PZW:[id number] DOB: 12/25/1945 DOA: 08/08/2020 PCP: Pcp, No   Chief Complain: Abdominal pain  Brief Narrative: Patient is a 75 year old female with history of diabetes type 2, hypertension, recently relocated from [place] who presented to the emergency department with complaints of abdominal pain.  CT abdomen on presentation showed gas in the upper abdomen with some inflammation at the pylorus/duodenal region suspicious for perforated ulcer.  Patient was admitted under general surgery service and she underwent omental Graham's patch on 08/09/2020.  He started having bowel movements, NG tube removed.  Currently on full liquid diet.  Liver biopsy confirmed hepatocellular carcinoma.  Oncology consulted.  Plan for MRI of the abdomen today.  Assessment & Plan:   Principal Problem:   Perforated gastric ulcer s/p lap omental [place] Heal patch 08/09/2020 Active Problems:   Pneumoperitoneum   History of colorectal cancer   Liver mass, left lobe   Insulin-requiring or dependent type II diabetes mellitus (HCC)   HTN (hypertension)   Constipation, chronic   Hyperglycemia   Perforated gastric ulcer: Presented with abdominal pain.  Found to have perforated gastric ulcer.  General surgery did laparoscopic omental Graham's patch on 07/2470.  She was on IV PPI, IV antibiotics( zosyn), IV hydration.   Antibiotics discontinued ,NG tube removed upper GI series with contrast negative for leak, started on fullr liquid diet. General surgery recommending outpatient GI follow-up for endoscopy in 6-8 weeks H. pylori  positive.  Started on triple therapy.  Liver mass/history of colon cancer:  Status post core biopsy of the liver mass.  She has history of remote colon cancer, currently in remission.  Liver biopsy confirmed hepatocellular carcinoma.  Will check alpha-fetoprotein, MRI of the abdomen.  Case discussed with oncology, outpatient follow-up confirmed  Elevated  LFTs: HCV antibodies positive.  Pending HCVRNA.  GI recommended outpatient follow-up for treatment.  Pulmonary edema: Chest x-ray showed bilateral perihilar interstitial pulmonary infiltrates/small bilateral pleural effusion.  Given 2 doses of Lasix IV 40 mg once.    Elevated BNP.  Echo ordered.  She does not appear volume overloaded during my evaluation.  She does not have peripheral edema.  Currently on room air.  Normocytic anemia/acute blood loss anemia: Most likely secondary to acute blood loss from surgery.  Hemoglobin in the range of 8 today.  We recommend blood transfusion if hemoglobin drops less than 7.  Hyponatremia/hypokalemia/hypomagnesemia: Continue to monitor and supplement  AKI on CKD stage II: AKI resolved with iv fluids  Diabetes type 2: Hemoglobin A1c of 5.8.  On sliding scale insulin  Hypertension: On nifedipine, Cozaar/hydrochlorothiazide at home.  Oral medications on hold.  Glaucoma: Continue home eyedrops            DVT prophylaxis:SCD Code Status: Full   Procedures:As above  Antimicrobials:  Anti-infectives (From admission, onward)    Start     Dose/Rate Route Frequency Ordered Stop   08/13/20 2200  clarithromycin (BIAXIN) tablet 500 mg        500 mg Oral Every 12 hours 08/13/20 1627 08/27/20 2159   08/13/20 2200  amoxicillin (AMOXIL) capsule 1,000 mg        1,000 mg Oral Every 12 hours 08/13/20 1627 08/27/20 2159   08/09/20 1000  piperacillin-tazobactam (ZOSYN) IVPB 3.375 g  Status:  Discontinued        3.375 g 12.5 mL/hr over 240 Minutes Intravenous Every 8 hours 08/09/20 0407 08/13/20 1627   08/09/20 0600  cefoTEtan (CEFOTAN) 2 g in sodium chloride  0.9 % 100 mL IVPB        2 g 200 mL/hr over 30 Minutes Intravenous On call to O.R. 08/09/20 0418 08/09/20 0659   08/09/20 0300  piperacillin-tazobactam (ZOSYN) IVPB 3.375 g        3.375 g 100 mL/hr over 30 Minutes Intravenous  Once 08/09/20 0253 08/09/20 0410       Subjective:  Patient seen  and examined at the bedside this morning.  She appears comfortable today.  She denies any abdomen pain, nausea or vomiting.  She had several bowel movements yesterday.  Objective: Vitals:   08/13/20 0527 08/13/20 1530 08/13/20 2219 08/14/20 0531  BP: (!) 149/75 140/68 140/68 (!) 146/70  Pulse: (!) 106 86 96 88  Resp: 18 18 18 18   Temp: 99.1 F (37.3 C) 99.1 F (37.3 C) 100.2 F (37.9 C) 99 F (37.2 C)  TempSrc: Oral Oral Oral Oral  SpO2: (!) 86% 99% 92% 99%  Weight:      Height:        Intake/Output Summary (Last 24 hours) at 08/14/2020 0802 Last data filed at 08/14/2020 0600 Gross per 24 hour  Intake 2045.17 ml  Output 0 ml  Net 2045.17 ml   Filed Weights   08/09/20 1500  Weight: 65 kg    Examination:  General exam: Overall comfortable, not in distress HEENT: PERRL Respiratory system:  no wheezes or crackles  Cardiovascular system: S1 & S2 heard, RRR.  Gastrointestinal system: Abdomen is nondistended, soft and nontender.Surgical scar.Bowel sounds present Central nervous system: Alert and oriented Extremities: No edema, no clubbing ,no cyanosis Skin: No rashes, no ulcers,no icterus     Data Reviewed: I have personally reviewed following labs and imaging studies  CBC: Recent Labs  Lab 08/08/20 2224 08/10/20 0548 08/11/20 0419 08/11/20 1248 08/11/20 2336 08/12/20 0407 08/13/20 0433 08/14/20 0428  WBC 9.7 9.3 9.7 8.8  --  7.8 9.2 9.8  NEUTROABS 8.6* 6.6  --   --   --   --   --  6.4  HGB 15.6* 7.6* 7.0* 6.9* 7.7* 7.7* 8.6* 8.1*  HCT 46.3* 22.2* 21.0* 20.7* 22.6* 23.2* 26.1* 24.3*  MCV 91.5 91.7 92.9 94.1  --  92.4 93.5 92.0  PLT 248 189 190 178  --  160 217 527   Basic Metabolic Panel: Recent Labs  Lab 08/10/20 0413 08/11/20 0419 08/12/20 0407 08/13/20 0433 08/14/20 0428  NA 137  135 139 142 141 140  K 3.8  3.7 3.4* 3.7 3.2* 3.2*  CL 104  105 107 111 109 107  CO2 25  24 25 25 22 26   GLUCOSE 213*  212* 155* 120* 142* 124*  BUN 37*  38* 23 16  14 16   CREATININE 1.41*  1.50* 0.79 0.65 0.73 0.57  CALCIUM 8.5*  8.3* 9.0 8.9 9.3 9.1  MG 1.6* 2.5*  --   --   --   PHOS 2.5  2.5  --   --   --   --    GFR: Estimated Creatinine Clearance: 53.3 mL/min (by C-G formula based on SCr of 0.57 mg/dL). Liver Function Tests: Recent Labs  Lab 08/08/20 2224 08/10/20 0413  AST 43* 90*  ALT 63* 87*  ALKPHOS 157* 70  BILITOT 1.1 0.6  PROT 7.9 5.3*  ALBUMIN 3.9 2.6*  2.6*   Recent Labs  Lab 08/08/20 2224  LIPASE 46   No results for input(s): AMMONIA in the last 168 hours. Coagulation Profile: No results for input(s): INR, PROTIME  in the last 168 hours. Cardiac Enzymes: No results for input(s): CKTOTAL, CKMB, CKMBINDEX, TROPONINI in the last 168 hours. BNP (last 3 results) No results for input(s): PROBNP in the last 8760 hours. HbA1C: No results for input(s): HGBA1C in the last 72 hours.  CBG: Recent Labs  Lab 08/13/20 1041 08/13/20 1732 08/13/20 2215 08/14/20 0214 08/14/20 0528  GLUCAP 152* 147* 146* 136* 124*   Lipid Profile: No results for input(s): CHOL, HDL, LDLCALC, TRIG, CHOLHDL, LDLDIRECT in the last 72 hours. Thyroid Function Tests: No results for input(s): TSH, T4TOTAL, FREET4, T3FREE, THYROIDAB in the last 72 hours. Anemia Panel: No results for input(s): VITAMINB12, FOLATE, FERRITIN, TIBC, IRON, RETICCTPCT in the last 72 hours. Sepsis Labs: No results for input(s): PROCALCITON, LATICACIDVEN in the last 168 hours.  Recent Results (from the past 240 hour(s))  Resp Panel by RT-PCR (Flu A&B, Covid) Nasopharyngeal Swab     Status: None   Collection Time: 08/08/20 11:35 PM   Specimen: Nasopharyngeal Swab; Nasopharyngeal(NP) swabs in vial transport medium  Result Value Ref Range Status   SARS Coronavirus 2 by RT PCR NEGATIVE NEGATIVE Final    Comment: (NOTE) SARS-CoV-2 target nucleic acids are NOT DETECTED.  The SARS-CoV-2 RNA is generally detectable in upper respiratory specimens during the acute phase of  infection. The lowest concentration of SARS-CoV-2 viral copies this assay can detect is 138 copies/mL. A negative result does not preclude SARS-Cov-2 infection and should not be used as the sole basis for treatment or other patient management decisions. A negative result may occur with  improper specimen collection/handling, submission of specimen other than nasopharyngeal swab, presence of viral mutation(s) within the areas targeted by this assay, and inadequate number of viral copies(<138 copies/mL). A negative result must be combined with clinical observations, patient history, and epidemiological information. The expected result is Negative.  Fact Sheet for Patients:  EntrepreneurPulse.com.au  Fact Sheet for Healthcare Providers:  IncredibleEmployment.be  This test is no t yet approved or cleared by the [place] FDA and  has been authorized for detection and/or diagnosis of SARS-CoV-2 by FDA under an Emergency Use Authorization (EUA). This EUA will remain  in effect (meaning this test can be used) for the duration of the COVID-19 declaration under Section 564(b)(1) of the Act, 21 U.S.C.section 360bbb-3(b)(1), unless the authorization is terminated  or revoked sooner.       Influenza A by PCR NEGATIVE NEGATIVE Final   Influenza B by PCR NEGATIVE NEGATIVE Final    Comment: (NOTE) The Xpert Xpress SARS-CoV-2/FLU/RSV plus assay is intended as an aid in the diagnosis of influenza from Nasopharyngeal swab specimens and should not be used as a sole basis for treatment. Nasal washings and aspirates are unacceptable for Xpert Xpress SARS-CoV-2/FLU/RSV testing.  Fact Sheet for Patients: EntrepreneurPulse.com.au  Fact Sheet for Healthcare Providers: IncredibleEmployment.be  This test is not yet approved or cleared by the [place] FDA and has been authorized for detection and/or diagnosis of SARS-CoV-2  by FDA under an Emergency Use Authorization (EUA). This EUA will remain in effect (meaning this test can be used) for the duration of the COVID-19 declaration under Section 564(b)(1) of the Act, 21 U.S.C. section 360bbb-3(b)(1), unless the authorization is terminated or revoked.  Performed at [facility], [place] [address] [place], [place] [address]          Radiology Studies: DG CHEST PORT 1 VIEW  Result Date: 08/13/2020 CLINICAL DATA:  Pleural effusion EXAM: PORTABLE CHEST 1 VIEW COMPARISON:  08/12/2020  FINDINGS: Nasogastric tube is again seen likely looped with its tip within the distal esophagus, unchanged from prior examination. The lungs are symmetrically expanded. Bilateral perihilar interstitial pulmonary infiltrate has progressed in keeping with progressive mild to moderate cardiogenic failure. Small bilateral pleural effusions are present, left greater than right. Cardiac size within normal limits. No acute bone abnormality. IMPRESSION: Nasogastric tube looped with its tip within the distal esophagus. Progressive cardiogenic failure. Small bilateral pleural effusions, left greater than right. Electronically Signed   By: Fidela Salisbury MD   On: 08/13/2020 05:08   DG CHEST PORT 1 VIEW  Result Date: 08/12/2020 CLINICAL DATA:  Hypoxia EXAM: PORTABLE CHEST 1 VIEW COMPARISON:  No prior. FINDINGS: NG tube noted descending into stomach and turned back on itself with tip in the esophagus. Repositioning suggested. Heart size normal. Bibasilar atelectasis/infiltrates. Small to moderate left pleural effusion. No pneumothorax. IMPRESSION: 1. NG tube noted descending into the stomach and turned back on itself with tip in the esophagus. Repositioning suggested. 2. Bibasilar atelectasis/infiltrates. Small to moderate left pleural effusion. These results will be called to the ordering clinician or representative by the Radiologist Assistant, and communication documented in the PACS  or [employer]. Electronically Signed   By: Marcello Moores  Register   On: 08/12/2020 11:34   DG UGI W SINGLE CM (SOL OR THIN BA)  Result Date: 08/13/2020 CLINICAL DATA:  Status post surgical repair of perforated gastric ulcer. EXAM: WATER SOLUBLE UPPER GI SERIES TECHNIQUE: Single-column upper GI series was performed using water soluble contrast. CONTRAST:  62mL OMNIPAQUE IOHEXOL 300 MG/ML  SOLN COMPARISON:  CT AP 08/09/2020 FLUOROSCOPY TIME:  Fluoroscopy Time:  5 minutes Radiation Exposure Index (if provided by the fluoroscopic device): 87.9 Number of Acquired Spot Images: 2 FINDINGS: On the scout radiograph patient's indwelling nasogastric tube was malpositioned with tip in the distal esophagus oriented cranially. A stiff guidewire was placed into the existing nasogastric tube, and the tube was repositioned such that the tip of the NG tube was in the distal body of stomach. Approximately 50 cc of water-soluble contrast material was then injected through the nasogastric tube opacifying the stomach and proximal small bowel loops. No extravasation of contrast material identified. IMPRESSION: 1. No extravasation of water-soluble contrast material to suggest gastric leak status post repair of ulcer. Electronically Signed   By: Kerby Moors M.D.   On: 08/13/2020 15:17        Scheduled Meds:  sodium chloride   Intravenous Once   amoxicillin  1,000 mg Oral Q12H   bisacodyl  10 mg Rectal Daily   Chlorhexidine Gluconate Cloth  6 each Topical Daily   clarithromycin  500 mg Oral Q12H   enoxaparin (LOVENOX) injection  40 mg Subcutaneous Q24H   insulin aspart  0-15 Units Subcutaneous Q4H   lip balm  1 application Topical BID   pantoprazole  40 mg Intravenous Q12H   pneumococcal 23 valent vaccine  0.5 mL Intramuscular Tomorrow-1000   Continuous Infusions:  sodium chloride 10 mL/hr at 08/10/20 1800   lactated ringers 50 mL/hr at 08/13/20 1531   methocarbamol (ROBAXIN) IV     ondansetron (ZOFRAN) IV        LOS: 5 days    Time spent: 25 mins.More than 50% of that time was spent in counseling and/or coordination of care.      Shelly Coss, MD [facility] P6/30/2022, 8:02 AM

## 2020-08-14 NOTE — Progress Notes (Signed)
Occupational Therapy Treatment Patient Details Name: [name] [name] MRN: [id number] DOB: Mar 08, 1945 Today's Date: 08/14/2020    History of present illness patient is a 37 female who recently traveld from [place] to visit daughter. patient presented to the hospital with abdominal pain. CT revealed inflammation at pylorus/duodenal region suspicion for perforated ulcer and noted to have mass on left lateral sector of the liver. on 6/25 patient underwent laparoscopic repair of perforated ulcer and lysis of adhesions. PMH is significant for glaucoma, HTN, and DM.   OT comments  Patient is a 75 year old female who was admitted for above diagnosis. Patient was min guard for supine to sit on edge of bed with cues to complete log rolling. Patient reported feeling better on this date. Patient required min guard to transfer from edge of bed to recliner in room. Patient was educated on reacher and sock aid to don/doff socks while seated. Patient required mod A to complete tasks with continued education.patient completed functional mobility tasks with RW with no unsteadiness or loss of balance. Patient was educated on benefits of using rolling walker while getting better to continue to build strength. Patient verbalized understanding.  Patient required supervision and set up for all toileting tasks in bathroom to increase independence in toileting tasks and to reduce caregiver assistance in next level of care. Patient's daughter requested to be trained on AE use during next session. Plan for next session to continue training in AE to reduce pressure on abdomen for LB dressing tasks.   Follow Up Recommendations  Supervision - Intermittent;Home health OT    Equipment Recommendations  Other (comment) (reacher, sock aid, dressing stick.)    Recommendations for Other Services      Precautions / Restrictions Precautions Precautions: Fall Precaution Comments: abdominal insicions, monitor  stats Restrictions Weight Bearing Restrictions: No       Mobility Bed Mobility Overal bed mobility: Needs Assistance Bed Mobility: Supine to Sit     Supine to sit: Supervision          Transfers Overall transfer level: Needs assistance Equipment used: None Transfers: Sit to/from Stand Sit to Stand: Min guard         General transfer comment: verbal cues for hand placement. noted to have some unsteadiness with reaching out on R side for objects when completing functional mobility in room.    Balance Overall balance assessment: Needs assistance Sitting-balance support: No upper extremity supported Sitting balance-Leahy Scale: Good     Standing balance support: No upper extremity supported Standing balance-Leahy Scale: Fair Standing balance comment: static fair, dynamic poor                           ADL either performed or assessed with clinical judgement   ADL Overall ADL's : Needs assistance/impaired Eating/Feeding: Modified independent   Grooming: Supervision/safety                   Toilet Transfer: Supervision/safety   Toileting- Water quality scientist and Hygiene: Min guard;Set up       Functional mobility during ADLs: Min guard General ADL Comments: patient requested to walk without RW and was noted to furniture walk to bathroom. patient agreed to use RW when completing longer periods of functional mobility.     Vision   Vision Assessment?: No apparent visual deficits   Perception     Praxis      Cognition Arousal/Alertness: Awake/alert Behavior During Therapy: WFL for tasks assessed/performed  Overall Cognitive Status: Within Functional Limits for tasks assessed                                                            Pertinent Vitals/ Pain       Pain Assessment: No/denies pain Pain Location: patient reported that at rest she does not have any pain in abdomen unless pressing on it.                                                         Frequency  Min 2X/week        Progress Toward Goals  OT Goals(current goals can now be found in the care plan section)  Progress towards OT goals: Progressing toward goals  Acute Rehab OT Goals Patient Stated Goal: to get back to daughters house OT Goal Formulation: With patient/family Time For Goal Achievement: 08/26/20 Potential to Achieve Goals: Good  Plan Discharge plan remains appropriate    Co-evaluation                 AM-PAC OT "6 Clicks" Daily Activity     Outcome Measure   Help from another person eating meals?: None Help from another person taking care of personal grooming?: A Little Help from another person toileting, which includes using toliet, bedpan, or urinal?: A Little Help from another person bathing (including washing, rinsing, drying)?: A Little Help from another person to put on and taking off regular upper body clothing?: A Little Help from another person to put on and taking off regular lower body clothing?: A Little (with AE) 6 Click Score: 19    End of Session Equipment Utilized During Treatment: Gait belt;Rolling walker  OT Visit Diagnosis: Unsteadiness on feet (R26.81);Pain Pain - part of body: Hand (patient repoted having pain in abdomen only if she presses on it.)   Activity Tolerance Patient tolerated treatment well   Patient Left with call bell/phone within reach;with family/visitor present;in chair   Nurse Communication          Time: 5035-4656 OT Time Calculation (min): 31 min  Charges: OT General Charges $OT Visit: 1 Visit OT Treatments $Self Care/Home Management : 23-37 mins  Jackelyn Poling OTR/L, [facility] Acute Rehabilitation Department Office# [phone] Pager# [phone]    [place] 08/14/2020, 12:06 PM

## 2020-08-14 NOTE — Progress Notes (Addendum)
5 Days Post-Op  Subjective: CC: Doing well. Very mild upper abdominal pain that is improved. Tolerating cld without n/v. Passing flatus. BM yesterday. Voiding. Mobilizing in room and with PT.   Objective: Vital signs in last 24 hours: Temp:  [99 F (37.2 C)-100.2 F (37.9 C)] 99 F (37.2 C) (06/30 0531) Pulse Rate:  [86-96] 88 (06/30 0531) Resp:  [18] 18 (06/30 0531) BP: (140-146)/(68-70) 146/70 (06/30 0531) SpO2:  [92 %-99 %] 99 % (06/30 0531) Last BM Date: 08/13/20  Intake/Output from previous day: 06/29 0701 - 06/30 0700 In: 2045.2 [P.O.:840; I.V.:1068.1; IV Piggyback:137.1] Out: 0  Intake/Output this shift: No intake/output data recorded.  PE: Gen:  Alert, NAD, pleasant Card: RRR Pulm:  CTAB, no W/R/R, effort normal. On RA.  Abd: Soft, mild epigastric tenderness that appears appropriate in the postoperative setting and improved from yesterday. Otherwise NT. No peritonitis. Laparoscopic incisions with Tegaderm dressing and gauze in place.  The epigastric incision has a small amount of dried blood on gauze and otherwise incisions are c/d/I. + bowel sounds. Noted prior midline wound that is well healed. Ext:  No LE edema or calf tenderness Psych: A&Ox3 Skin: no rashes noted, warm and dry  Lab Results:  Recent Labs    08/13/20 0433 08/14/20 0428  WBC 9.2 9.8  HGB 8.6* 8.1*  HCT 26.1* 24.3*  PLT 217 227   BMET Recent Labs    08/13/20 0433 08/14/20 0428  NA 141 140  K 3.2* 3.2*  CL 109 107  CO2 22 26  GLUCOSE 142* 124*  BUN 14 16  CREATININE 0.73 0.57  CALCIUM 9.3 9.1   PT/INR No results for input(s): LABPROT, INR in the last 72 hours. CMP     Component Value Date/Time   NA 140 08/14/2020 0428   K 3.2 (L) 08/14/2020 0428   CL 107 08/14/2020 0428   CO2 26 08/14/2020 0428   GLUCOSE 124 (H) 08/14/2020 0428   BUN 16 08/14/2020 0428   CREATININE 0.57 08/14/2020 0428   CALCIUM 9.1 08/14/2020 0428   PROT 5.3 (L) 08/10/2020 0413   ALBUMIN 2.6 (L)  08/10/2020 0413   ALBUMIN 2.6 (L) 08/10/2020 0413   AST 90 (H) 08/10/2020 0413   ALT 87 (H) 08/10/2020 0413   ALKPHOS 70 08/10/2020 0413   BILITOT 0.6 08/10/2020 0413   GFRNONAA >60 08/14/2020 0428   Lipase     Component Value Date/Time   LIPASE 46 08/08/2020 2224       Studies/Results: DG CHEST PORT 1 VIEW  Result Date: 08/13/2020 CLINICAL DATA:  Pleural effusion EXAM: PORTABLE CHEST 1 VIEW COMPARISON:  08/12/2020 FINDINGS: Nasogastric tube is again seen likely looped with its tip within the distal esophagus, unchanged from prior examination. The lungs are symmetrically expanded. Bilateral perihilar interstitial pulmonary infiltrate has progressed in keeping with progressive mild to moderate cardiogenic failure. Small bilateral pleural effusions are present, left greater than right. Cardiac size within normal limits. No acute bone abnormality. IMPRESSION: Nasogastric tube looped with its tip within the distal esophagus. Progressive cardiogenic failure. Small bilateral pleural effusions, left greater than right. Electronically Signed   By: Fidela Salisbury MD   On: 08/13/2020 05:08   DG CHEST PORT 1 VIEW  Result Date: 08/12/2020 CLINICAL DATA:  Hypoxia EXAM: PORTABLE CHEST 1 VIEW COMPARISON:  No prior. FINDINGS: NG tube noted descending into stomach and turned back on itself with tip in the esophagus. Repositioning suggested. Heart size normal. Bibasilar atelectasis/infiltrates. Small to moderate left pleural  effusion. No pneumothorax. IMPRESSION: 1. NG tube noted descending into the stomach and turned back on itself with tip in the esophagus. Repositioning suggested. 2. Bibasilar atelectasis/infiltrates. Small to moderate left pleural effusion. These results will be called to the ordering clinician or representative by the Radiologist Assistant, and communication documented in the PACS or [employer]. Electronically Signed   By: Marcello Moores  Register   On: 08/12/2020 11:34   DG UGI W SINGLE  CM (SOL OR THIN BA)  Result Date: 08/13/2020 CLINICAL DATA:  Status post surgical repair of perforated gastric ulcer. EXAM: WATER SOLUBLE UPPER GI SERIES TECHNIQUE: Single-column upper GI series was performed using water soluble contrast. CONTRAST:  58mL OMNIPAQUE IOHEXOL 300 MG/ML  SOLN COMPARISON:  CT AP 08/09/2020 FLUOROSCOPY TIME:  Fluoroscopy Time:  5 minutes Radiation Exposure Index (if provided by the fluoroscopic device): 87.9 Number of Acquired Spot Images: 2 FINDINGS: On the scout radiograph patient's indwelling nasogastric tube was malpositioned with tip in the distal esophagus oriented cranially. A stiff guidewire was placed into the existing nasogastric tube, and the tube was repositioned such that the tip of the NG tube was in the distal body of stomach. Approximately 50 cc of water-soluble contrast material was then injected through the nasogastric tube opacifying the stomach and proximal small bowel loops. No extravasation of contrast material identified. IMPRESSION: 1. No extravasation of water-soluble contrast material to suggest gastric leak status post repair of ulcer. Electronically Signed   By: Kerby Moors M.D.   On: 08/13/2020 15:17    Anti-infectives: Anti-infectives (From admission, onward)    Start     Dose/Rate Route Frequency Ordered Stop   08/13/20 2200  clarithromycin (BIAXIN) tablet 500 mg        500 mg Oral Every 12 hours 08/13/20 1627 08/27/20 2159   08/13/20 2200  amoxicillin (AMOXIL) capsule 1,000 mg        1,000 mg Oral Every 12 hours 08/13/20 1627 08/27/20 2159   08/09/20 1000  piperacillin-tazobactam (ZOSYN) IVPB 3.375 g  Status:  Discontinued        3.375 g 12.5 mL/hr over 240 Minutes Intravenous Every 8 hours 08/09/20 0407 08/13/20 1627   08/09/20 0600  cefoTEtan (CEFOTAN) 2 g in sodium chloride 0.9 % 100 mL IVPB        2 g 200 mL/hr over 30 Minutes Intravenous On call to O.R. 08/09/20 0418 08/09/20 0659   08/09/20 0300  piperacillin-tazobactam (ZOSYN)  IVPB 3.375 g        3.375 g 100 mL/hr over 30 Minutes Intravenous  Once 08/09/20 0253 08/09/20 0410        Assessment/Plan POD 5 s/p diagnostic laparoscopy, LOA, omental Phillip Heal patch of perforated ulcer, core liver biopsy for perforated stomach ulcer, liver masses and history of colon cancer-Dr. Gross-08/09/20 - UGI negative for leak. Tolerating CLD. Adv to FLD  - H. Pylori positive - per GI 14 days with clarithromycin 500 mg PO BID and amoxicillin 1000 mg PO BID. PPI BID for a minimum of 2 months - Mobilize. [place] [facility]  - Pulm toilet. - Will need outpatient GI f/u for endoscopy to ensure ulcer has healed in 6-8 weeks  Path with Hepatocellular carcinoma - Discussed with Dr. Benay Spice of oncology.    FEN - FLD, IVF per [facility] (currently on 50cc/hr), replace K (3.2) VTE - SCDs, start Lovenox today (hgb stable) ID - Zosyn Foley - out Follow-Up - Dr. Johney Maine   Appreciate [facility]'s assistance Hep C Ab reactive - GI consulted. They  have ordered HCV RNA. If positive recommend HCV fibrosure, HCV genotype (non-reflex), and HBsAg for screening and outpatient follow-up. Notes hx of prior transfusion in [place] ABL anemia - hgb 6.9 > 1U PRBC > 7.7 > 7.7 > 8.6 > 8.1. AM labs Hypoxia - CXR with progressive cardiogenic failure. BNP elevated. Received IV lasix with improvement. No hypoxia overnight. [facility] has ordered echo. Continuous pulse ox and telemetry ordered. Reported xx of colon cancer  AKI on CKD3 - AKI resolved DM2 - SSI HTN - home meds when taking PO. PRN IV Hydralazine    LOS: 5 days    [name] , [facility] Surgery 08/14/2020, 10:12 AM Please see Amion for pager number during day hours 7:00am-4:30pm

## 2020-08-14 NOTE — Progress Notes (Signed)
*  PRELIMINARY RESULTS* Echocardiogram 2D Echocardiogram has been performed.  Luisa Hart RDCS 08/14/2020, 12:45 PM

## 2020-08-15 ENCOUNTER — Encounter: Payer: Self-pay | Admitting: *Deleted

## 2020-08-15 ENCOUNTER — Inpatient Hospital Stay (HOSPITAL_COMMUNITY): Payer: Medicaid Other

## 2020-08-15 LAB — CBC
HCT: 26.5 % — ABNORMAL LOW (ref 36.0–46.0)
Hemoglobin: 8.5 g/dL — ABNORMAL LOW (ref 12.0–15.0)
MCH: 30.1 pg (ref 26.0–34.0)
MCHC: 32.1 g/dL (ref 30.0–36.0)
MCV: 94 fL (ref 80.0–100.0)
Platelets: 244 10*3/uL (ref 150–400)
RBC: 2.82 MIL/uL — ABNORMAL LOW (ref 3.87–5.11)
RDW: 14.8 % (ref 11.5–15.5)
WBC: 9.9 10*3/uL (ref 4.0–10.5)
nRBC: 1.7 % — ABNORMAL HIGH (ref 0.0–0.2)

## 2020-08-15 LAB — BASIC METABOLIC PANEL
Anion gap: 6 (ref 5–15)
BUN: 13 mg/dL (ref 8–23)
CO2: 24 mmol/L (ref 22–32)
Calcium: 9.1 mg/dL (ref 8.9–10.3)
Chloride: 108 mmol/L (ref 98–111)
Creatinine, Ser: 0.56 mg/dL (ref 0.44–1.00)
GFR, Estimated: >60 mL/min (ref >60)
Glucose, Bld: 185 mg/dL — ABNORMAL HIGH (ref 70–99)
Potassium: 3.3 mmol/L — ABNORMAL LOW (ref 3.5–5.1)
Sodium: 138 mmol/L (ref 135–145)

## 2020-08-15 LAB — GLUCOSE, CAPILLARY
Glucose-Capillary: 167 mg/dL — ABNORMAL HIGH (ref 70–99)
Glucose-Capillary: 169 mg/dL — ABNORMAL HIGH (ref 70–99)
Glucose-Capillary: 176 mg/dL — ABNORMAL HIGH (ref 70–99)
Glucose-Capillary: 181 mg/dL — ABNORMAL HIGH (ref 70–99)
Glucose-Capillary: 200 mg/dL — ABNORMAL HIGH (ref 70–99)
Glucose-Capillary: 201 mg/dL — ABNORMAL HIGH (ref 70–99)
Glucose-Capillary: 225 mg/dL — ABNORMAL HIGH (ref 70–99)

## 2020-08-15 LAB — AFP TUMOR MARKER: AFP, Serum, Tumor Marker: 66.5 ng/mL — ABNORMAL HIGH (ref 0.0–9.2)

## 2020-08-15 IMAGING — MR MR ABDOMEN WO/W CM
19 of 20 series · 47 of 48 positions shown · IV contrast (gadavist)
Comparison: Abdominal CT [DATE]

CLINICAL DATA: Recent gastric perforation, treated with TORKY
TORKY. Hepatocellular carcinoma, biopsy at time of perforation
repair.

EXAM:
MRI ABDOMEN WITHOUT AND WITH CONTRAST
TECHNIQUE: Multiplanar multisequence MR imaging of the abdomen was performed
both before and after the administration of intravenous contrast.
CONTRAST:  6.5mL GADAVIST GADOBUTROL 1 MMOL/ML IV SOLN

[Series 3: T2 · coronal · 6.0mm · 1.56mm/px · 2 of 30 slices shown (1 of 2)]
[im 1/30]
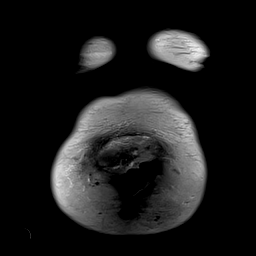
[im 30/30]
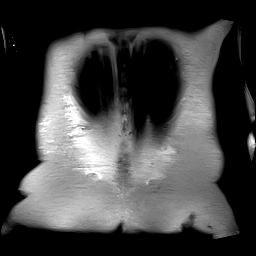

[Series 4: DWI · axial · 6.0mm · 1.49mm/px · z∈[-140,+91]mm · 3 of 65 slices shown (1 of 2)]
[im 1/65]
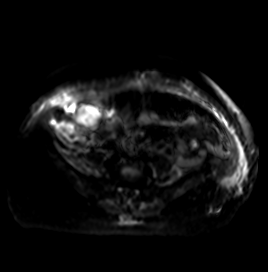
[im 33/65]
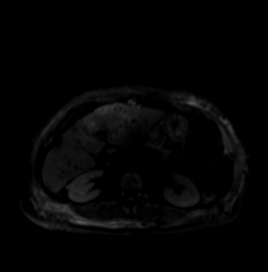
[im 65/65]
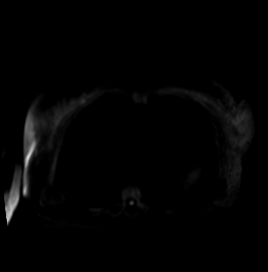

[Series 5: DWI · axial · 6.0mm · 1.49mm/px · 1 of 33 slices shown (2 of 2)]
[im 1/33]
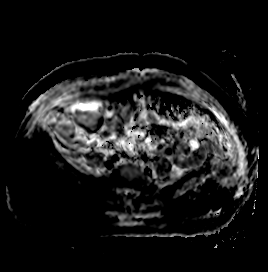

[Series 6: T2 fat-sat · 1 of 8 slices shown (1 of 2)]
[im 1/8]
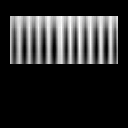

[Series 7: T2 fat-sat · axial · 6.0mm · 1.25mm/px · 1 of 33 slices shown (2 of 2)]
[im 1/33]
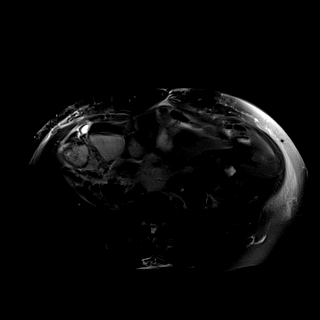

[Series 8: T1 · axial · 3.0mm · 1.25mm/px · z∈[-136,+101]mm · 3 of 80 slices shown (1 of 2)]
[im 1/80]
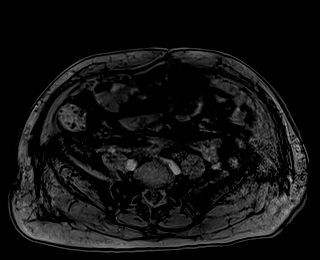
[im 40/80]
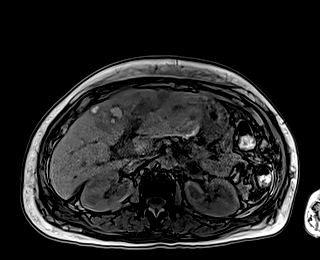
[im 80/80]
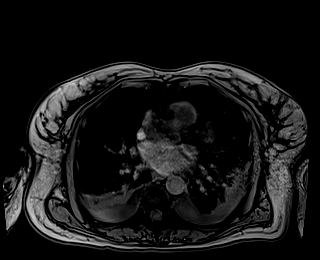

[Series 9: T1 · axial · 3.0mm · 1.25mm/px · z∈[-136,+101]mm · 3 of 80 slices shown (2 of 2)]
[im 1/80]
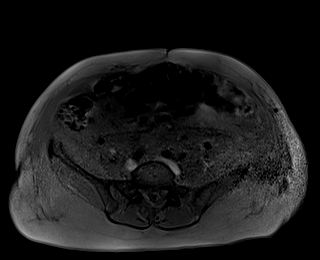
[im 40/80]
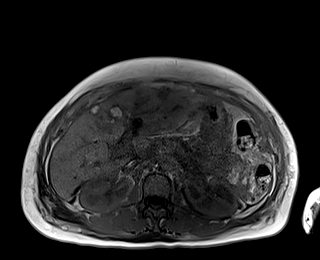
[im 80/80]
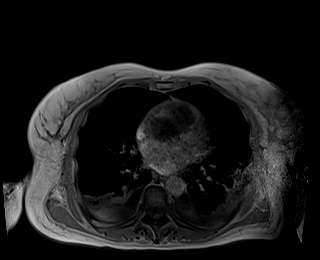

[Series 10: bSSFP · axial · 4.0mm · 0.84mm/px · z∈[-132,+84]mm · 2 of 55 slices shown]
[im 1/55]
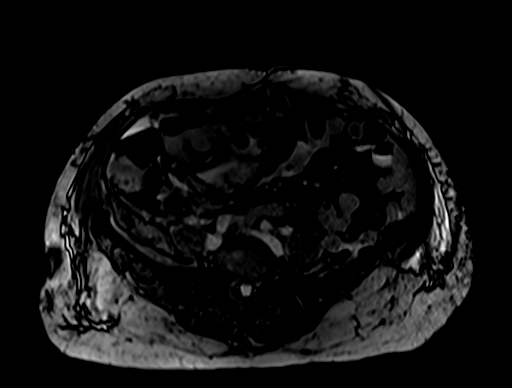
[im 55/55]
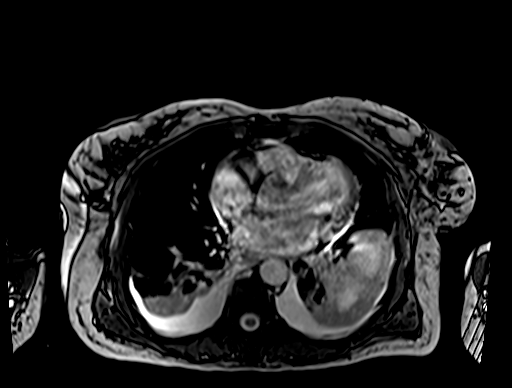

[Series 12: T1 dynamic · axial · 3.0mm · 1.25mm/px · z∈[-124,+89]mm · 3 of 72 slices shown (1 of 10)]
[im 1/72]
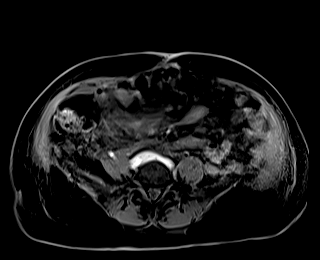
[im 36/72]
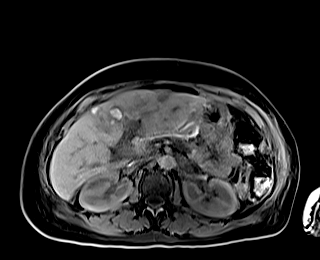
[im 72/72]
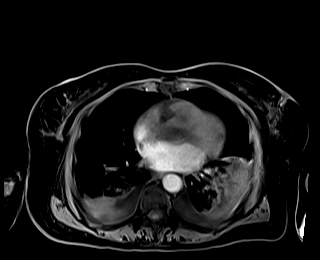

[Series 16: T1 dynamic · axial · 3.0mm · 1.25mm/px · z∈[-124,+89]mm · 3 of 72 slices shown (2 of 10)]
[im 1/72]
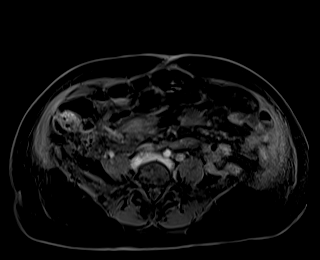
[im 36/72]
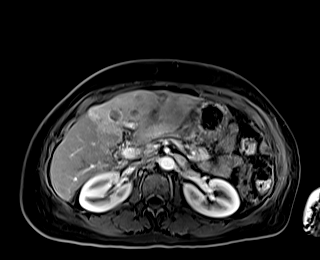
[im 72/72]
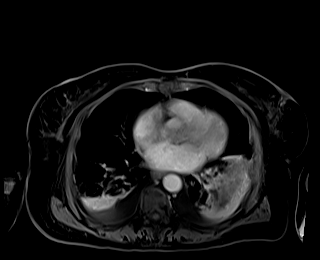

[Series 17: T1 dynamic · axial · 3.0mm · 1.25mm/px · z∈[-124,+89]mm · 3 of 72 slices shown (3 of 10)]
[im 1/72]
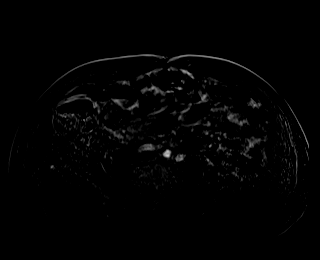
[im 36/72]
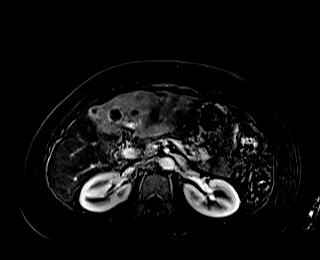
[im 72/72]
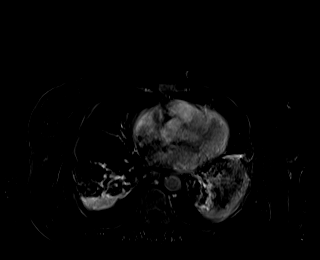

[Series 20: T1 dynamic · axial · 3.0mm · 1.25mm/px · z∈[-124,+89]mm · 3 of 72 slices shown (4 of 10)]
[im 1/72]
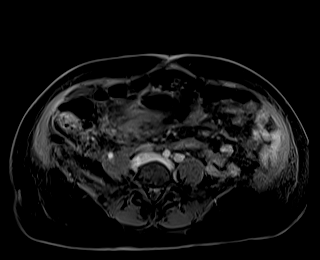
[im 36/72]
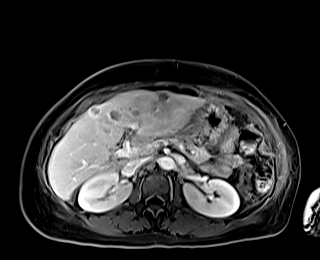
[im 72/72]
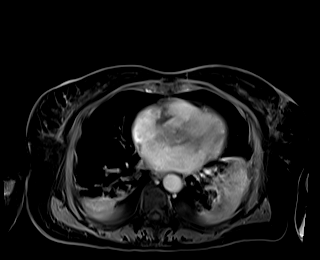

[Series 21: T1 dynamic · axial · 3.0mm · 1.25mm/px · z∈[-124,+89]mm · 3 of 72 slices shown (5 of 10)]
[im 1/72]
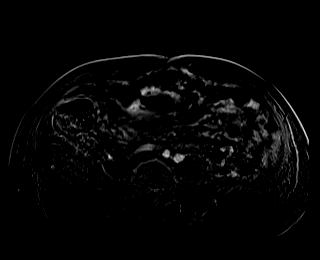
[im 36/72]
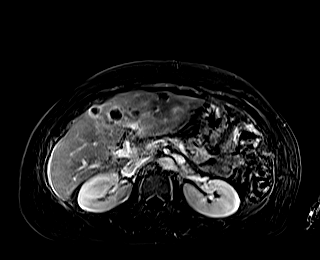
[im 72/72]
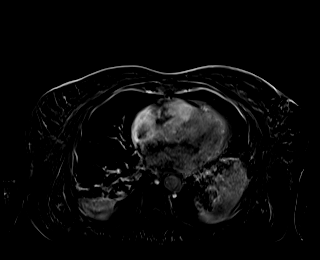

[Series 24: T1 dynamic · axial · 3.0mm · 1.25mm/px · z∈[-124,+89]mm · 3 of 72 slices shown (6 of 10)]
[im 1/72]
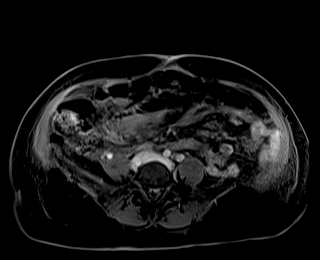
[im 36/72]
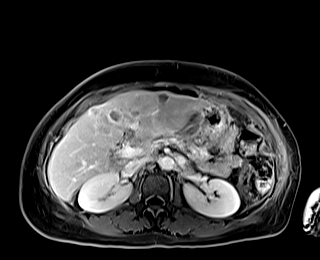
[im 72/72]
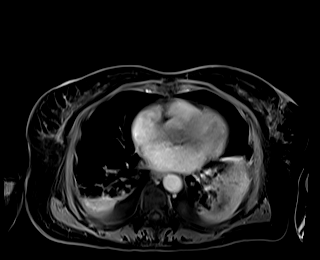

[Series 25: T1 dynamic · axial · 3.0mm · 1.25mm/px · z∈[-124,+89]mm · 3 of 72 slices shown (7 of 10)]
[im 1/72]
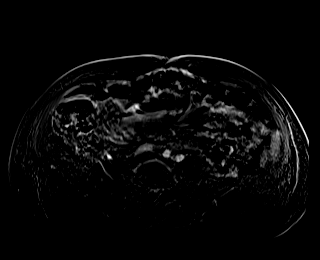
[im 36/72]
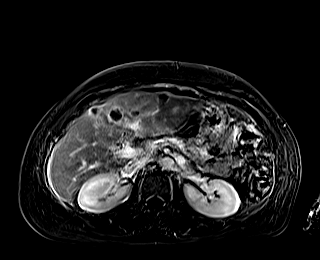
[im 72/72]
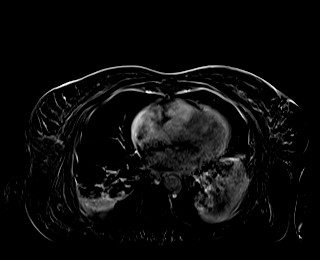

[Series 27: T1 dynamic · coronal · 3.0mm · 1.41mm/px · 3 of 72 slices shown (8 of 10)]
[im 1/72]
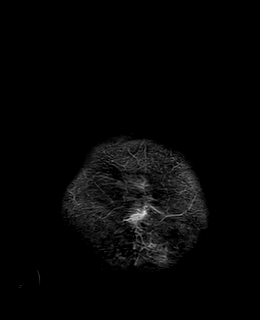
[im 36/72]
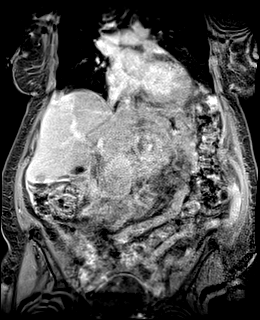
[im 72/72]
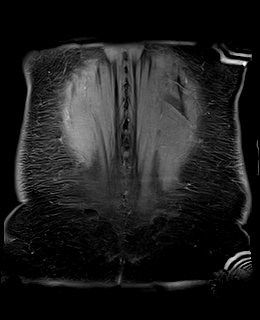

[Series 28: T2 · axial · 6.0mm · 1.56mm/px · 1 of 30 slices shown (2 of 2)]
[im 1/30]
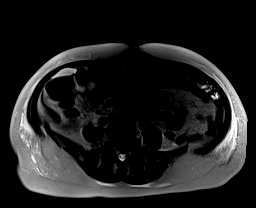

[Series 31: T1 dynamic · axial · 3.0mm · 1.25mm/px · z∈[-124,+89]mm · 3 of 72 slices shown (9 of 10)]
[im 1/72]
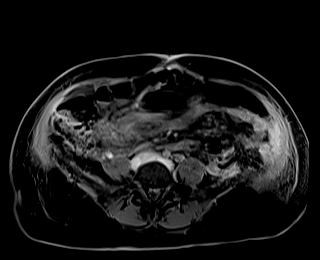
[im 36/72]
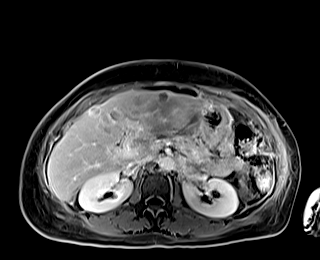
[im 72/72]
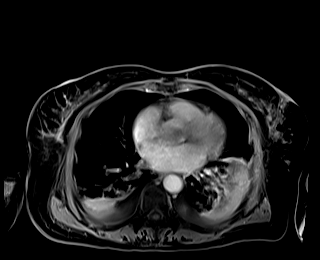

[Series 32: T1 dynamic · axial · 3.0mm · 1.25mm/px · z∈[-124,+89]mm · 3 of 72 slices shown (10 of 10)]
[im 1/72]
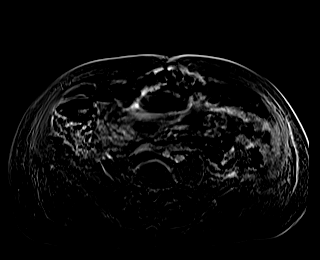
[im 36/72]
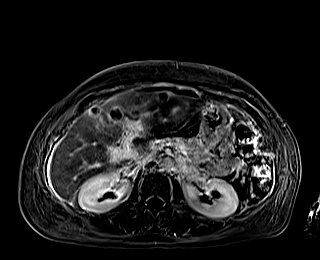
[im 72/72]
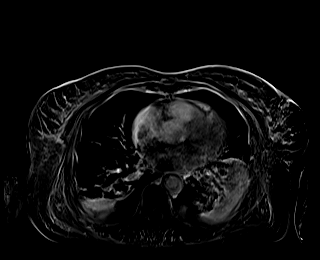

[47 of 48 positions shown; findings below may reference images not displayed]

FINDINGS: Lower chest: Subcutaneous edema in the chest and abdomen. Small
bilateral pleural effusions with passive atelectasis.

Hepatobiliary: Multifocal hepatocellular carcinoma primarily in the
left hepatic lobe, with at least 15 foci in the left hepatic lobe,
the largest being an exophytic mass measuring 7.2 by 6.8 cm on image
48 of series 16, but most of the lesions substantially smaller. Some
of these lesions have high precontrast T1 signal characteristics
internally indicating blood products. The lesions demonstrate
arterial phase enhancement along with washout and are biopsy proven
to be hepatocellular carcinoma. While most of these lesions are
observed in the left hepatic lobe, on image 50 of series 16 there
appears to be a 1.5 by 1.1 cm rim enhancing lesion in segment 5 of
the liver adjacent to the gallbladder, although admittedly this is
right along the liver margin. Accounting for the motion artifact, I
do not see well-defined tumor thrombus in the portal venous system
or hepatic venous system.

A gallstone is noted in the gallbladder.

Pancreas:  Unremarkable

Spleen:  Unremarkable

Adrenals/Urinary Tract:  Unremarkable

Stomach/Bowel: Indistinct margins of the stomach and proximal
duodenum in the vicinity of the recent repair. Surrounding edema and
a small amount of ascites in the upper abdomen noted. No new free
air is identified.

Vascular/Lymphatic:  Unremarkable

Other: Mesenteric and omental edema in the upper abdomen along with
mild upper abdominal ascites. Diffuse subcutaneous edema.

Musculoskeletal: Unremarkable
IMPRESSION: 1. Numerous masses in the left hepatic lobe measuring up to 7.2 cm
in diameter compatible with multifocal hepatocellular carcinoma.
There is also probably a lesion in segment 5 of the liver
representing the only identified right hepatic lobe involvement.
2. Third spacing of fluid with subcutaneous, mesenteric, and omental
edema along with upper abdominal ascites. Small bilateral pleural
effusions.
3. Cholelithiasis.

## 2020-08-15 MED ORDER — GADOBUTROL 1 MMOL/ML IV SOLN
6.5000 mL | Freq: Once | INTRAVENOUS | Status: AC | PRN
  Administered 2020-08-15: 6.5 mL via INTRAVENOUS

## 2020-08-15 MED ORDER — POTASSIUM CHLORIDE CRYS ER 20 MEQ PO TBCR
40.0000 meq | EXTENDED_RELEASE_TABLET | Freq: Once | ORAL | Status: AC
  Administered 2020-08-15: 40 meq via ORAL
  Filled 2020-08-15: qty 2

## 2020-08-15 NOTE — Progress Notes (Signed)
6 Days Post-Op  Subjective: CC: Patient reports she only had a yogurt yesterday off FLD menu and became bloated. Otherwise stuck with cld. Bloating has ceased this am. Denies n/v. Passing flatus. BM yesterday that was small and formed per patient (not documented on I/O). Working with therapies. Voiding.   Objective: Vital signs in last 24 hours: Temp:  [100.1 F (37.8 C)-100.5 F (38.1 C)] 100.5 F (38.1 C) (07/01 0635) Pulse Rate:  [95-98] 96 (07/01 0635) Resp:  [18-20] 20 (07/01 0635) BP: (128-153)/(63-78) 143/65 (07/01 0635) SpO2:  [94 %-99 %] 99 % (07/01 0635) Last BM Date: 08/13/20  Intake/Output from previous day: 06/30 0701 - 07/01 0700 In: 1424.5 [P.O.:720; I.V.:704.5] Out: -  Intake/Output this shift: Total I/O In: 240 [P.O.:240] Out: -   PE: Gen:  Alert, NAD, pleasant Card: RRR Pulm:  CTAB, no W/R/R, effort normal. On RA.  Abd: Soft, minimal to no distension today. Mild epigastric tenderness that appears appropriate in the postoperative setting stable from yesterday. Otherwise NT. No peritonitis. Laparoscopic incisions c/d/I. + bowel sounds. Noted prior midline wound that is well healed. Ext:  No LE edema or calf tenderness Psych: A&Ox3 Skin: no rashes noted, warm and dry  Lab Results:  Recent Labs    08/14/20 0428 08/15/20 0345  WBC 9.8 9.9  HGB 8.1* 8.5*  HCT 24.3* 26.5*  PLT 227 244   BMET Recent Labs    08/14/20 0428 08/15/20 0345  NA 140 138  K 3.2* 3.3*  CL 107 108  CO2 26 24  GLUCOSE 124* 185*  BUN 16 13  CREATININE 0.57 0.56  CALCIUM 9.1 9.1   PT/INR No results for input(s): LABPROT, INR in the last 72 hours. CMP     Component Value Date/Time   NA 138 08/15/2020 0345   K 3.3 (L) 08/15/2020 0345   CL 108 08/15/2020 0345   CO2 24 08/15/2020 0345   GLUCOSE 185 (H) 08/15/2020 0345   BUN 13 08/15/2020 0345   CREATININE 0.56 08/15/2020 0345   CALCIUM 9.1 08/15/2020 0345   PROT 5.3 (L) 08/10/2020 0413   ALBUMIN 2.6 (L)  08/10/2020 0413   ALBUMIN 2.6 (L) 08/10/2020 0413   AST 90 (H) 08/10/2020 0413   ALT 87 (H) 08/10/2020 0413   ALKPHOS 70 08/10/2020 0413   BILITOT 0.6 08/10/2020 0413   GFRNONAA >60 08/15/2020 0345   Lipase     Component Value Date/Time   LIPASE 46 08/08/2020 2224       Studies/Results: DG UGI W SINGLE CM (SOL OR THIN BA)  Result Date: 08/13/2020 CLINICAL DATA:  Status post surgical repair of perforated gastric ulcer. EXAM: WATER SOLUBLE UPPER GI SERIES TECHNIQUE: Single-column upper GI series was performed using water soluble contrast. CONTRAST:  9mL OMNIPAQUE IOHEXOL 300 MG/ML  SOLN COMPARISON:  CT AP 08/09/2020 FLUOROSCOPY TIME:  Fluoroscopy Time:  5 minutes Radiation Exposure Index (if provided by the fluoroscopic device): 87.9 Number of Acquired Spot Images: 2 FINDINGS: On the scout radiograph patient's indwelling nasogastric tube was malpositioned with tip in the distal esophagus oriented cranially. A stiff guidewire was placed into the existing nasogastric tube, and the tube was repositioned such that the tip of the NG tube was in the distal body of stomach. Approximately 50 cc of water-soluble contrast material was then injected through the nasogastric tube opacifying the stomach and proximal small bowel loops. No extravasation of contrast material identified. IMPRESSION: 1. No extravasation of water-soluble contrast material to suggest gastric leak status  post repair of ulcer. Electronically Signed   By: Kerby Moors M.D.   On: 08/13/2020 15:17   ECHOCARDIOGRAM COMPLETE  Result Date: 08/14/2020    ECHOCARDIOGRAM REPORT   Patient Name:   [name] [name] [facility] Date of Exam: 08/14/2020 Medical Rec #:  [id number]                   Height:       64.0 in Accession #:    [id number]                  Weight:       143.3 lb Date of Birth:  [birth year]                    BSA:          1.698 m Patient Age:    75 years                    BP:           146/70 mmHg Patient Gender: F                            HR:           93 bpm. Exam Location:  Inpatient Procedure: 2D Echo, Cardiac Doppler and Color Doppler Indications:     Dyspnea  History:         Patient has prior history of Echocardiogram examinations and                  Patient has no prior history of Echocardiogram examinations.                  Risk Factors:Hypertension and Diabetes.  Sonographer:     Hendricks Referring Phys:  [id number] AMRIT ADHIKARI Diagnosing Phys: Eleonore Chiquito MD  Sonographer Comments: Suboptimal parasternal window and suboptimal subcostal window. IMPRESSIONS  1. Left ventricular ejection fraction, by estimation, is 65 to 70%. The left ventricle has normal function. The left ventricle has no regional wall motion abnormalities. Left ventricular diastolic parameters are consistent with Grade I diastolic dysfunction (impaired relaxation).  2. Right ventricular systolic function is normal. The right ventricular size is normal.  3. Left atrial size was mildly dilated.  4. The mitral valve is grossly normal. Trivial mitral valve regurgitation. No evidence of mitral stenosis.  5. The aortic valve is tricuspid. Aortic valve regurgitation is not visualized. No aortic stenosis is present. FINDINGS  Left Ventricle: Left ventricular ejection fraction, by estimation, is 65 to 70%. The left ventricle has normal function. The left ventricle has no regional wall motion abnormalities. The left ventricular internal cavity size was normal in size. There is  no left ventricular hypertrophy. Left ventricular diastolic parameters are consistent with Grade I diastolic dysfunction (impaired relaxation). Right Ventricle: The right ventricular size is normal. No increase in right ventricular wall thickness. Right ventricular systolic function is normal. Left Atrium: Left atrial size was mildly dilated. Right Atrium: Right atrial size was normal in size. Pericardium: Trivial pericardial effusion is present. Mitral Valve: The mitral valve is  grossly normal. Trivial mitral valve regurgitation. No evidence of mitral valve stenosis. Tricuspid Valve: The tricuspid valve is grossly normal. Tricuspid valve regurgitation is trivial. No evidence of tricuspid stenosis. Aortic Valve: The aortic valve is tricuspid. Aortic valve regurgitation is not visualized. No aortic stenosis is present. Aortic valve mean gradient measures 6.0  mmHg. Aortic valve peak gradient measures 10.4 mmHg. Aortic valve area, by VTI measures 3.03  cm. Pulmonic Valve: The pulmonic valve was grossly normal. Pulmonic valve regurgitation is not visualized. No evidence of pulmonic stenosis. Aorta: The aortic root is normal in size and structure. Venous: The inferior vena cava was not well visualized. IAS/Shunts: The atrial septum is grossly normal.  LEFT VENTRICLE PLAX 2D LVIDd:         4.60 cm     Diastology LVIDs:         2.30 cm     LV e' medial:    5.98 cm/s LV PW:         1.20 cm     LV E/e' medial:  12.1 LV IVS:        1.10 cm     LV e' lateral:   10.60 cm/s LVOT diam:     2.20 cm     LV E/e' lateral: 6.8 LV SV:         86 LV SV Index:   50 LVOT Area:     3.80 cm  LV Volumes (MOD) LV vol d, MOD A2C: 48.6 ml LV vol d, MOD A4C: 58.3 ml LV vol s, MOD A2C: 27.7 ml LV vol s, MOD A4C: 15.5 ml LV SV MOD A2C:     20.9 ml LV SV MOD A4C:     58.3 ml LV SV MOD BP:      35.9 ml RIGHT VENTRICLE RV Basal diam:  3.50 cm RV Mid diam:    2.60 cm RV S prime:     20.50 cm/s TAPSE (M-mode): 2.4 cm LEFT ATRIUM             Index       RIGHT ATRIUM           Index LA diam:        3.00 cm 1.77 cm/m  RA Area:     19.00 cm LA Vol (A2C):   82.6 ml 48.65 ml/m RA Volume:   56.90 ml  33.51 ml/m LA Vol (A4C):   67.7 ml 39.87 ml/m LA Biplane Vol: 77.2 ml 45.47 ml/m  AORTIC VALVE                    PULMONIC VALVE AV Area (Vmax):    2.97 cm     PV Vmax:       0.93 m/s AV Area (Vmean):   2.90 cm     PV Vmean:      60.100 cm/s AV Area (VTI):     3.03 cm     PV VTI:        0.160 m AV Vmax:           161.00 cm/s   PV Peak grad:  3.5 mmHg AV Vmean:          110.000 cm/s PV Mean grad:  2.0 mmHg AV VTI:            0.282 m AV Peak Grad:      10.4 mmHg AV Mean Grad:      6.0 mmHg LVOT Vmax:         126.00 cm/s LVOT Vmean:        83.900 cm/s LVOT VTI:          0.225 m LVOT/AV VTI ratio: 0.80  AORTA Ao Root diam: 3.30 cm MITRAL VALVE  TRICUSPID VALVE MV Area (PHT): 4.57 cm      TR Peak grad:   23.4 mmHg MV Decel Time: 166 msec      TR Vmax:        242.00 cm/s MR Peak grad:    113.2 mmHg MR Vmax:         532.00 cm/s SHUNTS MR PISA:         0.57 cm    Systemic VTI:  0.22 m MR PISA Eff ROA: 2 mm       Systemic Diam: 2.20 cm MR PISA Radius:  0.30 cm MV E velocity: 72.20 cm/s MV A velocity: 84.20 cm/s MV E/A ratio:  0.86 Eleonore Chiquito MD Electronically signed by Eleonore Chiquito MD Signature Date/Time: 08/14/2020/3:09:21 PM    Final (Updated)     Anti-infectives: Anti-infectives (From admission, onward)    Start     Dose/Rate Route Frequency Ordered Stop   08/13/20 2200  clarithromycin (BIAXIN) tablet 500 mg        500 mg Oral Every 12 hours 08/13/20 1627 08/27/20 2159   08/13/20 2200  amoxicillin (AMOXIL) capsule 1,000 mg        1,000 mg Oral Every 12 hours 08/13/20 1627 08/27/20 2159   08/09/20 1000  piperacillin-tazobactam (ZOSYN) IVPB 3.375 g  Status:  Discontinued        3.375 g 12.5 mL/hr over 240 Minutes Intravenous Every 8 hours 08/09/20 0407 08/13/20 1627   08/09/20 0600  cefoTEtan (CEFOTAN) 2 g in sodium chloride 0.9 % 100 mL IVPB        2 g 200 mL/hr over 30 Minutes Intravenous On call to O.R. 08/09/20 0418 08/09/20 0659   08/09/20 0300  piperacillin-tazobactam (ZOSYN) IVPB 3.375 g        3.375 g 100 mL/hr over 30 Minutes Intravenous  Once 08/09/20 0253 08/09/20 0410        Assessment/Plan POD 6 s/p diagnostic laparoscopy, LOA, omental Phillip Heal patch of perforated ulcer, core liver biopsy for perforated stomach ulcer, liver masses and history of colon cancer-Dr. Gross-08/09/20 - UGI  negative for leak. On FLD. Some bloating overnight. Will keep here today - H. Pylori positive - per GI 14 days with clarithromycin 500 mg PO BID and amoxicillin 1000 mg PO BID. PPI BID for a minimum of 2 months - Mobilize. [place] [facility]  - Pulm toilet. - Will need outpatient GI f/u for endoscopy to ensure ulcer has healed in 6-8 weeks   Path with Hepatocellular carcinoma - Discussed with patient and her daughter. Dr. Benay Spice of oncology has been consulted. Recommended MRI to start   FEN - FLD, IVF per [facility] (currently on 50cc/hr), replace K (3.3) VTE - SCDs, start Lovenox today (hgb stable) ID - Zosyn 6/25 - 6/29. Amox/Clinda 6/29 >> Foley - out Follow-Up - Dr. Johney Maine. GI. Oncology    Appreciate [facility]'s assistance Hep C Ab reactive - GI consulted. They have ordered HCV RNA. If positive recommend HCV fibrosure, HCV genotype (non-reflex), and HBsAg for screening and outpatient follow-up. Notes hx of prior transfusion in [place] ABL anemia - hgb 6.9 > 1U PRBC > 7.7 > 7.7 > 8.6 > 8.1 > 8.5. AM labs Hypoxia - CXR earlier in the week with progressive cardiogenic failure. BNP elevated. Received IV lasix with improvement. No hypoxia overnight. [facility] has ordered echo - EF 65-70% w/ G1DD and no wall motion abnormalities  Reported hx of colon cancer AKI on CKD3 - AKI resolved DM2 - SSI HTN - Appreciate [facility]  assistance. PRN IV Hydralazine    LOS: 6 days    [name] , [facility] 08/15/2020, 10:45 AM Please see Amion for pager number during day hours 7:00am-4:30pm

## 2020-08-15 NOTE — Progress Notes (Signed)
Occupational Therapy Treatment Patient Details Name: [name] [name] MRN: [id number] DOB: [birth year] Today's Date: 08/15/2020    History of present illness patient is a 46 female who recently traveld from [place] to visit daughter. patient presented to the hospital with abdominal pain. CT revealed inflammation at pylorus/duodenal region suspicion for perforated ulcer and noted to have mass on left lateral sector of the liver. on 6/25 patient underwent laparoscopic repair of perforated ulcer and lysis of adhesions. liver biopsy path with Hepatocellular carcinoma. PMH is significant for glaucoma, HTN, and DM.   OT comments  Patient awaiting MRI at this time. Patient and daughter were educated on using sock aid with visual and verbal education on how to use sock aid. Patient and patients daughter verbalized understanding. Patient and patients daughter were show tub transfer bench and provided with visual demonstration on how to use bench. Patient declined to trial at this time. Patient and patients daughter were provided with hand out of AE names and tub transfer bench. Patients daughter to discuss with social work over what is and is not covered under insurance.   Follow Up Recommendations  Supervision - Intermittent;Home health OT    Equipment Recommendations  Other (comment);Tub/shower bench (reacher sock aid long handeled sponge)    Recommendations for Other Services      Precautions / Restrictions Precautions Precautions: Fall Precaution Comments: abdominal insicions, monitor stats       Mobility Bed Mobility               General bed mobility comments: pt in recliner on arrival    Transfers Overall transfer level: Needs assistance Equipment used: None Transfers: Sit to/from Stand Sit to Stand: Min guard         General transfer comment: verbal cues for hand placement    Balance                                           ADL either  performed or assessed with clinical judgement   ADL Overall ADL's : Needs assistance/impaired                       Lower Body Dressing Details (indicate cue type and reason): patient and daughter were educated on using AE for Lb dressing with sock aid and reacher with demonstration with patient participation. patient and patients daughter were provided with hand outs on AE.           Tub/Shower Transfer Details (indicate cue type and reason): patient and patients daughter were educated on tub transfer bench and were provided with visual demonstration on how to use it. patient and patients daughter verbalized understanding.         Vision   Vision Assessment?: No apparent visual deficits              Cognition Arousal/Alertness: Awake/alert Behavior During Therapy: WFL for tasks assessed/performed Overall Cognitive Status: Within Functional Limits for tasks assessed                                                            Pertinent Vitals/ Pain       Pain Assessment: No/denies pain  Home Living                                              Frequency  Min 2X/week        Progress Toward Goals  OT Goals(current goals can now be found in the care plan section)  Progress towards OT goals: Progressing toward goals  Acute Rehab OT Goals Patient Stated Goal: to get back to daughters house OT Goal Formulation: With patient/family Time For Goal Achievement: 08/26/20 Potential to Achieve Goals: Good  Plan Discharge plan remains appropriate                     AM-PAC OT "6 Clicks" Daily Activity     Outcome Measure   Help from another person eating meals?: None Help from another person taking care of personal grooming?: A Little Help from another person toileting, which includes using toliet, bedpan, or urinal?: A Little Help from another person bathing (including washing, rinsing, drying)?: A  Little Help from another person to put on and taking off regular upper body clothing?: A Little Help from another person to put on and taking off regular lower body clothing?: A Little 6 Click Score: 19    End of Session    OT Visit Diagnosis: Unsteadiness on feet (R26.81);Pain   Activity Tolerance Patient tolerated treatment well   Patient Left with call bell/phone within reach;with family/visitor present;in chair             Time: 6333-5456 OT Time Calculation (min): 22 min  Charges: OT General Charges $OT Visit: 1 Visit OT Treatments $Self Care/Home Management : 8-22 mins  Jackelyn Poling OTR/L, MS Acute Rehabilitation Department Office# [phone] Pager# [phone]    [place] 08/15/2020, 12:42 PM

## 2020-08-15 NOTE — Progress Notes (Signed)
Request received to see this patient, who is currently admitted, for follow up after new diagnosis of [place]. Reviewed with Dr Marin Olp and he would like to see the patient in about 3 weeks.   Called and spoke to patient's daughter, Yehuda Budd. She is aware of new patient appointment. She also asks how to sign up for MyChart. Information provided. I will follow up with them again next week to ensure that they have all the information for new patient appointment, as today patient is still in the hospital, and Blandine didn't have her calendar with her.   Oncology Nurse Navigator Documentation  Oncology Nurse Navigator Flowsheets 08/15/2020  Abnormal Finding Date 08/09/2020  Confirmed Diagnosis Date 08/09/2020  Diagnosis Status Confirmed Diagnosis Complete  Navigator Follow Up Date: 09/01/2020  Navigator Follow Up Reason: New Patient Appointment  Navigator Location [facility]  Referral Date to RadOnc/MedOnc 08/14/2020  Navigator Encounter Type Introductory Phone Call  Patient Visit Type MedOnc  Treatment Phase Pre-Tx/Tx Discussion  Barriers/Navigation Needs Coordination of Care;Education  Education Other  Interventions Coordination of Care;Education;Psycho-Social Support  Acuity Level 2-Minimal Needs (1-2 Barriers Identified)  Coordination of Care Appts  Education Method Verbal  Support Groups/Services Friends and Family  Time Spent with Patient 71

## 2020-08-15 NOTE — Progress Notes (Signed)
PROGRESS NOTE    [name] [name] [place]  SHF:[id number] DOB: December 28, 1945 DOA: 08/08/2020 PCP: Pcp, No   Chief Complain: Abdominal pain  Brief Narrative: Patient is a 75 year old female with history of diabetes type 2, hypertension, recently relocated from [place] who presented to the emergency department with complaints of abdominal pain.  CT abdomen on presentation showed gas in the upper abdomen with some inflammation at the pylorus/duodenal region suspicious for perforated ulcer.  Patient was admitted under general surgery service and she underwent omental Graham's patch on 08/09/2020.  He started having bowel movements, NG tube removed.  Currently on full liquid diet.  Liver biopsy confirmed hepatocellular carcinoma.  Oncology consulted.  Plan for MRI of the abdomen today.  Assessment & Plan:   Principal Problem:   Perforated gastric ulcer s/p lap omental Phillip Heal patch 08/09/2020 Active Problems:   Pneumoperitoneum   History of colorectal cancer   Liver mass, left lobe   Insulin-requiring or dependent type II diabetes mellitus (HCC)   HTN (hypertension)   Constipation, chronic   Hyperglycemia   Hepatocellular carcinoma ([place])   Perforated gastric ulcer: Presented with abdominal pain.  Found to have perforated gastric ulcer.  General surgery did laparoscopic omental Graham's patch on 07/2470.  She was on IV PPI, IV antibiotics( zosyn), IV hydration.   Antibiotics discontinued ,NG tube removed upper GI series with contrast negative for leak, started on fullr liquid diet. General surgery recommending outpatient GI follow-up for endoscopy in 6-8 weeks H. pylori  positive.  Started on triple therapy.  Liver mass/history of colon cancer:  Status post core biopsy of the liver mass.  She has history of remote colon cancer, currently in remission.  Liver biopsy confirmed hepatocellular carcinoma. Elevated  alpha-fetoprotein, pending MRI of the abdomen.  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  GI recommended outpatient follow-up for treatment.  Pulmonary edema: Chest x-ray showed bilateral perihilar interstitial pulmonary infiltrates/small bilateral pleural effusion.  Given 2 doses of Lasix IV 40 mg once.    Elevated BNP.  Echo ordered.  She does not appear volume overloaded during my evaluation.  She does not have peripheral edema.  Currently on room air.  Echo showed ejection fraction of 50-27%, grade 1 diastolic dysfunction  Normocytic anemia/acute blood loss anemia: Most likely secondary to acute blood loss from surgery.  Hemoglobin in the range of 8 today.  We recommend blood transfusion if hemoglobin drops less than 7.  Hyponatremia/hypokalemia/hypomagnesemia: Continue to monitor and supplement  AKI on CKD stage II: AKI resolved with iv fluids  Diabetes type 2: Hemoglobin A1c of 5.8.  On sliding scale insulin  Hypertension: On nifedipine, Cozaar/hydrochlorothiazide at home.  Oral medications on hold.  Glaucoma: Continue home eyedrops            DVT prophylaxis:SCD Code Status: Full   Procedures:As above  Antimicrobials:  Anti-infectives (From admission, onward)    Start     Dose/Rate Route Frequency Ordered Stop   08/13/20 2200  clarithromycin (BIAXIN) tablet 500 mg        500 mg Oral Every 12 hours 08/13/20 1627 08/27/20 2159   08/13/20 2200  amoxicillin (AMOXIL) capsule 1,000 mg        1,000 mg Oral Every 12 hours 08/13/20 1627 08/27/20 2159   08/09/20 1000  piperacillin-tazobactam (ZOSYN) IVPB 3.375 g  Status:  Discontinued        3.375 g 12.5 mL/hr over 240  Minutes Intravenous Every 8 hours 08/09/20 0407 08/13/20 1627   08/09/20 0600  cefoTEtan (CEFOTAN) 2 g in sodium chloride 0.9 % 100 mL IVPB        2 g 200 mL/hr over 30 Minutes Intravenous On call to O.R. 08/09/20 0418 08/09/20 0659   08/09/20 0300  piperacillin-tazobactam  (ZOSYN) IVPB 3.375 g        3.375 g 100 mL/hr over 30 Minutes Intravenous  Once 08/09/20 0253 08/09/20 0410       Subjective:  Patient seen and examined the bedside this morning.  Hemodynamically stable.  She was about to work with the physical therapist.  Denies any new complaints today.  No bowel movement today  Objective: Vitals:   08/14/20 0531 08/14/20 1349 08/14/20 2252 08/15/20 0635  BP: (!) 146/70 (!) 153/78 128/63 (!) 143/65  Pulse: 88 95 98 96  Resp: 18 18 18 20   Temp: 99 F (37.2 C) 100.1 F (37.8 C) 100.1 F (37.8 C) (!) 100.5 F (38.1 C)  TempSrc: Oral Oral Oral Oral  SpO2: 99% 95% 94% 99%  Weight:      Height:        Intake/Output Summary (Last 24 hours) at 08/15/2020 0837 Last data filed at 08/14/2020 1800 Gross per 24 hour  Intake 1424.47 ml  Output --  Net 1424.47 ml   Filed Weights   08/09/20 1500  Weight: 65 kg    Examination:  General exam: Overall comfortable, not in distress HEENT: PERRL Respiratory system:  no wheezes or crackles  Cardiovascular system: S1 & S2 heard, RRR.  Gastrointestinal system: Abdomen is nondistended, soft and nontender.laparoscopic incisions.,  Old midline surgical scar. Central nervous system: Alert and oriented Extremities: No edema, no clubbing ,no cyanosis Skin: No rashes, no ulcers,no icterus       Data Reviewed: I have personally reviewed following labs and imaging studies  CBC: Recent Labs  Lab 08/08/20 2224 08/10/20 0548 08/11/20 0419 08/11/20 1248 08/11/20 2336 08/12/20 0407 08/13/20 0433 08/14/20 0428 08/15/20 0345  WBC 9.7 9.3   < > 8.8  --  7.8 9.2 9.8 9.9  NEUTROABS 8.6* 6.6  --   --   --   --   --  6.4  --   HGB 15.6* 7.6*   < > 6.9* 7.7* 7.7* 8.6* 8.1* 8.5*  HCT 46.3* 22.2*   < > 20.7* 22.6* 23.2* 26.1* 24.3* 26.5*  MCV 91.5 91.7   < > 94.1  --  92.4 93.5 92.0 94.0  PLT 248 189   < > 178  --  160 217 227 244   < > = values in this interval not displayed.   Basic Metabolic Panel: Recent  Labs  Lab 08/10/20 0413 08/11/20 0419 08/12/20 0407 08/13/20 0433 08/14/20 0428 08/15/20 0345  NA 137  135 139 142 141 140 138  K 3.8  3.7 3.4* 3.7 3.2* 3.2* 3.3*  CL 104  105 107 111 109 107 108  CO2 25  24 25 25 22 26 24   GLUCOSE 213*  212* 155* 120* 142* 124* 185*  BUN 37*  38* 23 16 14 16 13   CREATININE 1.41*  1.50* 0.79 0.65 0.73 0.57 0.56  CALCIUM 8.5*  8.3* 9.0 8.9 9.3 9.1 9.1  MG 1.6* 2.5*  --   --   --   --   PHOS 2.5  2.5  --   --   --   --   --    GFR: Estimated Creatinine Clearance:  53.3 mL/min (by C-G formula based on SCr of 0.56 mg/dL). Liver Function Tests: Recent Labs  Lab 08/08/20 2224 08/10/20 0413  AST 43* 90*  ALT 63* 87*  ALKPHOS 157* 70  BILITOT 1.1 0.6  PROT 7.9 5.3*  ALBUMIN 3.9 2.6*  2.6*   Recent Labs  Lab 08/08/20 2224  LIPASE 46   No results for input(s): AMMONIA in the last 168 hours. Coagulation Profile: No results for input(s): INR, PROTIME in the last 168 hours. Cardiac Enzymes: No results for input(s): CKTOTAL, CKMB, CKMBINDEX, TROPONINI in the last 168 hours. BNP (last 3 results) No results for input(s): PROBNP in the last 8760 hours. HbA1C: No results for input(s): HGBA1C in the last 72 hours.  CBG: Recent Labs  Lab 08/14/20 1423 08/14/20 2004 08/14/20 2358 08/15/20 0428 08/15/20 0737  GLUCAP 189* 226* 225* 169* 181*   Lipid Profile: No results for input(s): CHOL, HDL, LDLCALC, TRIG, CHOLHDL, LDLDIRECT in the last 72 hours. Thyroid Function Tests: No results for input(s): TSH, T4TOTAL, FREET4, T3FREE, THYROIDAB in the last 72 hours. Anemia Panel: No results for input(s): VITAMINB12, FOLATE, FERRITIN, TIBC, IRON, RETICCTPCT in the last 72 hours. Sepsis Labs: No results for input(s): PROCALCITON, LATICACIDVEN in the last 168 hours.  Recent Results (from the past 240 hour(s))  Resp Panel by RT-PCR (Flu A&B, Covid) Nasopharyngeal Swab     Status: None   Collection Time: 08/08/20 11:35 PM   Specimen:  Nasopharyngeal Swab; Nasopharyngeal(NP) swabs in vial transport medium  Result Value Ref Range Status   SARS Coronavirus 2 by RT PCR NEGATIVE NEGATIVE Final    Comment: (NOTE) SARS-CoV-2 target nucleic acids are NOT DETECTED.  The SARS-CoV-2 RNA is generally detectable in upper respiratory specimens during the acute phase of infection. The lowest concentration of SARS-CoV-2 viral copies this assay can detect is 138 copies/mL. A negative result does not preclude SARS-Cov-2 infection and should not be used as the sole basis for treatment or other patient management decisions. A negative result may occur with  improper specimen collection/handling, submission of specimen other than nasopharyngeal swab, presence of viral mutation(s) within the areas targeted by this assay, and inadequate number of viral copies(<138 copies/mL). A negative result must be combined with clinical observations, patient history, and epidemiological information. The expected result is Negative.  Fact Sheet for Patients:  EntrepreneurPulse.com.au  Fact Sheet for Healthcare Providers:  IncredibleEmployment.be  This test is no t yet approved or cleared by the [place] FDA and  has been authorized for detection and/or diagnosis of SARS-CoV-2 by FDA under an Emergency Use Authorization (EUA). This EUA will remain  in effect (meaning this test can be used) for the duration of the COVID-19 declaration under Section 564(b)(1) of the Act, 21 U.S.C.section 360bbb-3(b)(1), unless the authorization is terminated  or revoked sooner.       Influenza A by PCR NEGATIVE NEGATIVE Final   Influenza B by PCR NEGATIVE NEGATIVE Final    Comment: (NOTE) The Xpert Xpress SARS-CoV-2/FLU/RSV plus assay is intended as an aid in the diagnosis of influenza from Nasopharyngeal swab specimens and should not be used as a sole basis for treatment. Nasal washings and aspirates are unacceptable for  Xpert Xpress SARS-CoV-2/FLU/RSV testing.  Fact Sheet for Patients: EntrepreneurPulse.com.au  Fact Sheet for Healthcare Providers: IncredibleEmployment.be  This test is not yet approved or cleared by the [place] FDA and has been authorized for detection and/or diagnosis of SARS-CoV-2 by FDA under an Emergency Use Authorization (EUA). This EUA will remain  in effect (meaning this test can be used) for the duration of the COVID-19 declaration under Section 564(b)(1) of the Act, 21 U.S.C. section 360bbb-3(b)(1), unless the authorization is terminated or revoked.  Performed at [facility], [place] [address], [place], [place] [address]          Radiology Studies: DG UGI W SINGLE CM (SOL OR THIN BA)  Result Date: 08/13/2020 CLINICAL DATA:  Status post surgical repair of perforated gastric ulcer. EXAM: WATER SOLUBLE UPPER GI SERIES TECHNIQUE: Single-column upper GI series was performed using water soluble contrast. CONTRAST:  6mL OMNIPAQUE IOHEXOL 300 MG/ML  SOLN COMPARISON:  CT AP 08/09/2020 FLUOROSCOPY TIME:  Fluoroscopy Time:  5 minutes Radiation Exposure Index (if provided by the fluoroscopic device): 87.9 Number of Acquired Spot Images: 2 FINDINGS: On the scout radiograph patient's indwelling nasogastric tube was malpositioned with tip in the distal esophagus oriented cranially. A stiff guidewire was placed into the existing nasogastric tube, and the tube was repositioned such that the tip of the NG tube was in the distal body of stomach. Approximately 50 cc of water-soluble contrast material was then injected through the nasogastric tube opacifying the stomach and proximal small bowel loops. No extravasation of contrast material identified. IMPRESSION: 1. No extravasation of water-soluble contrast material to suggest gastric leak status post repair of ulcer. Electronically Signed   By: Kerby Moors M.D.   On: 08/13/2020 15:17    ECHOCARDIOGRAM COMPLETE  Result Date: 08/14/2020    ECHOCARDIOGRAM REPORT   Patient Name:   ALLEAN MONTFORT [facility] Date of Exam: 08/14/2020 Medical Rec #:  [id number]                   Height:       64.0 in Accession #:    [id number]                  Weight:       143.3 lb Date of Birth:  1945-06-18                    BSA:          1.698 m Patient Age:    76 years                    BP:           146/70 mmHg Patient Gender: F                           HR:           93 bpm. Exam Location:  Inpatient Procedure: 2D Echo, Cardiac Doppler and Color Doppler Indications:     Dyspnea  History:         Patient has prior history of Echocardiogram examinations and                  Patient has no prior history of Echocardiogram examinations.                  Risk Factors:Hypertension and Diabetes.  Sonographer:     Portal Referring Phys:  [id number] Erine Phenix Diagnosing Phys: Eleonore Chiquito MD  Sonographer Comments: Suboptimal parasternal window and suboptimal subcostal window. IMPRESSIONS  1. Left ventricular ejection fraction, by estimation, is 65 to 70%. The left ventricle has normal function. The left ventricle has no regional wall motion abnormalities. Left ventricular diastolic parameters are consistent with Grade  I diastolic dysfunction (impaired relaxation).  2. Right ventricular systolic function is normal. The right ventricular size is normal.  3. Left atrial size was mildly dilated.  4. The mitral valve is grossly normal. Trivial mitral valve regurgitation. No evidence of mitral stenosis.  5. The aortic valve is tricuspid. Aortic valve regurgitation is not visualized. No aortic stenosis is present. FINDINGS  Left Ventricle: Left ventricular ejection fraction, by estimation, is 65 to 70%. The left ventricle has normal function. The left ventricle has no regional wall motion abnormalities. The left ventricular internal cavity size was normal in size. There is  no left ventricular hypertrophy. Left  ventricular diastolic parameters are consistent with Grade I diastolic dysfunction (impaired relaxation). Right Ventricle: The right ventricular size is normal. No increase in right ventricular wall thickness. Right ventricular systolic function is normal. Left Atrium: Left atrial size was mildly dilated. Right Atrium: Right atrial size was normal in size. Pericardium: Trivial pericardial effusion is present. Mitral Valve: The mitral valve is grossly normal. Trivial mitral valve regurgitation. No evidence of mitral valve stenosis. Tricuspid Valve: The tricuspid valve is grossly normal. Tricuspid valve regurgitation is trivial. No evidence of tricuspid stenosis. Aortic Valve: The aortic valve is tricuspid. Aortic valve regurgitation is not visualized. No aortic stenosis is present. Aortic valve mean gradient measures 6.0 mmHg. Aortic valve peak gradient measures 10.4 mmHg. Aortic valve area, by VTI measures 3.03  cm. Pulmonic Valve: The pulmonic valve was grossly normal. Pulmonic valve regurgitation is not visualized. No evidence of pulmonic stenosis. Aorta: The aortic root is normal in size and structure. Venous: The inferior vena cava was not well visualized. IAS/Shunts: The atrial septum is grossly normal.  LEFT VENTRICLE PLAX 2D LVIDd:         4.60 cm     Diastology LVIDs:         2.30 cm     LV e' medial:    5.98 cm/s LV PW:         1.20 cm     LV E/e' medial:  12.1 LV IVS:        1.10 cm     LV e' lateral:   10.60 cm/s LVOT diam:     2.20 cm     LV E/e' lateral: 6.8 LV SV:         86 LV SV Index:   50 LVOT Area:     3.80 cm  LV Volumes (MOD) LV vol d, MOD A2C: 48.6 ml LV vol d, MOD A4C: 58.3 ml LV vol s, MOD A2C: 27.7 ml LV vol s, MOD A4C: 15.5 ml LV SV MOD A2C:     20.9 ml LV SV MOD A4C:     58.3 ml LV SV MOD BP:      35.9 ml RIGHT VENTRICLE RV Basal diam:  3.50 cm RV Mid diam:    2.60 cm RV S prime:     20.50 cm/s TAPSE (M-mode): 2.4 cm LEFT ATRIUM             Index       RIGHT ATRIUM           Index LA  diam:        3.00 cm 1.77 cm/m  RA Area:     19.00 cm LA Vol (A2C):   82.6 ml 48.65 ml/m RA Volume:   56.90 ml  33.51 ml/m LA Vol (A4C):   67.7 ml 39.87 ml/m LA Biplane Vol: 77.2 ml 45.47 ml/m  AORTIC VALVE                    PULMONIC VALVE AV Area (Vmax):    2.97 cm     PV Vmax:       0.93 m/s AV Area (Vmean):   2.90 cm     PV Vmean:      60.100 cm/s AV Area (VTI):     3.03 cm     PV VTI:        0.160 m AV Vmax:           161.00 cm/s  PV Peak grad:  3.5 mmHg AV Vmean:          110.000 cm/s PV Mean grad:  2.0 mmHg AV VTI:            0.282 m AV Peak Grad:      10.4 mmHg AV Mean Grad:      6.0 mmHg LVOT Vmax:         126.00 cm/s LVOT Vmean:        83.900 cm/s LVOT VTI:          0.225 m LVOT/AV VTI ratio: 0.80  AORTA Ao Root diam: 3.30 cm MITRAL VALVE                 TRICUSPID VALVE MV Area (PHT): 4.57 cm      TR Peak grad:   23.4 mmHg MV Decel Time: 166 msec      TR Vmax:        242.00 cm/s MR Peak grad:    113.2 mmHg MR Vmax:         532.00 cm/s SHUNTS MR PISA:         0.57 cm    Systemic VTI:  0.22 m MR PISA Eff ROA: 2 mm       Systemic Diam: 2.20 cm MR PISA Radius:  0.30 cm MV E velocity: 72.20 cm/s MV A velocity: 84.20 cm/s MV E/A ratio:  0.86 Eleonore Chiquito MD Electronically signed by Eleonore Chiquito MD Signature Date/Time: 08/14/2020/3:09:21 PM    Final (Updated)         Scheduled Meds:  sodium chloride   Intravenous Once   amoxicillin  1,000 mg Oral Q12H   bisacodyl  10 mg Rectal Daily   Chlorhexidine Gluconate Cloth  6 each Topical Daily   clarithromycin  500 mg Oral Q12H   enoxaparin (LOVENOX) injection  40 mg Subcutaneous Q24H   insulin aspart  0-15 Units Subcutaneous Q4H   lip balm  1 application Topical BID   pantoprazole  40 mg Intravenous Q12H   pneumococcal 23 valent vaccine  0.5 mL Intramuscular Tomorrow-1000   Continuous Infusions:  sodium chloride 10 mL/hr at 08/10/20 1800   lactated ringers 50 mL/hr at 08/15/20 0442   methocarbamol (ROBAXIN) IV     ondansetron (ZOFRAN)  IV       LOS: 6 days    Time spent: 25 mins.More than 50% of that time was spent in counseling and/or coordination of care.      Shelly Coss, MD [facility] P7/02/2020, 8:37 AM

## 2020-08-15 NOTE — Progress Notes (Signed)
Physical Therapy Treatment Patient Details Name: [name] [name] MRN: [id number] DOB: 1945/08/31 Today's Date: 08/15/2020    History of Present Illness patient is a 78 female who recently traveld from [place] to visit daughter. patient presented to the hospital with abdominal pain. CT revealed inflammation at pylorus/duodenal region suspicion for perforated ulcer and noted to have mass on left lateral sector of the liver. on 6/25 patient underwent laparoscopic repair of perforated ulcer and lysis of adhesions. liver biopsy path with Hepatocellular carcinoma. PMH is significant for glaucoma, HTN, and DM.    PT Comments    Pt assisted with ambulating in hallway.  Awaiting MRI due to liver path results.  Recommended pt use RW for safety upon d/c and pt reports understanding.    Follow Up Recommendations  Home health PT     Equipment Recommendations  Rolling walker with 5" wheels    Recommendations for Other Services       Precautions / Restrictions Precautions Precautions: Fall Precaution Comments: abdominal insicions, monitor stats    Mobility  Bed Mobility               General bed mobility comments: pt in recliner on arrival    Transfers Overall transfer level: Needs assistance Equipment used: None Transfers: Sit to/from Stand Sit to Stand: Min guard         General transfer comment: verbal cues for hand placement  Ambulation/Gait Ambulation/Gait assistance: Min guard Gait Distance (Feet): 100 Feet (x2) Assistive device: None Gait Pattern/deviations: Step-through pattern;Decreased stride length Gait velocity: decr   General Gait Details: pt declined using walker, held onto therapist 100 ft, took short rest break, and then no UE support upon returning to room, mildly unsteadiness however no overt LOB, pt encouraged to use RW at home for safety; Spo2 92% on room air and HR 106 bpm upon sitting in recliner   Stairs             Wheelchair  Mobility    Modified Rankin (Stroke Patients Only)       Balance                                            Cognition Arousal/Alertness: Awake/alert Behavior During Therapy: WFL for tasks assessed/performed Overall Cognitive Status: Within Functional Limits for tasks assessed                                        Exercises      General Comments        Pertinent Vitals/Pain Pain Assessment: No/denies pain    Home Living                      Prior Function            PT Goals (current goals can now be found in the care plan section) Progress towards PT goals: Progressing toward goals    Frequency    Min 3X/week      PT Plan Current plan remains appropriate    Co-evaluation              AM-PAC PT "6 Clicks" Mobility   Outcome Measure  Help needed turning from your back to your side while in a flat bed without using bedrails?: A  Little Help needed moving from lying on your back to sitting on the side of a flat bed without using bedrails?: A Little Help needed moving to and from a bed to a chair (including a wheelchair)?: A Little Help needed standing up from a chair using your arms (e.g., wheelchair or bedside chair)?: A Little Help needed to walk in hospital room?: A Little Help needed climbing 3-5 steps with a railing? : A Little 6 Click Score: 18    End of Session   Activity Tolerance: Patient tolerated treatment well Patient left: in chair;with call bell/phone within reach;with family/visitor present Nurse Communication: Mobility status PT Visit Diagnosis: Difficulty in walking, not elsewhere classified (R26.2)     Time: 9150-4136 PT Time Calculation (min) (ACUTE ONLY): 15 min  Charges:  $Gait Training: 8-22 mins                     Arlyce Dice, DPT Acute Rehabilitation Services Pager: [phone] Office: [phone]    York Ram E 08/15/2020, 12:28 PM

## 2020-08-16 LAB — CBC
HCT: 26.2 % — ABNORMAL LOW (ref 36.0–46.0)
Hemoglobin: 8.6 g/dL — ABNORMAL LOW (ref 12.0–15.0)
MCH: 30.7 pg (ref 26.0–34.0)
MCHC: 32.8 g/dL (ref 30.0–36.0)
MCV: 93.6 fL (ref 80.0–100.0)
Platelets: 273 10*3/uL (ref 150–400)
RBC: 2.8 MIL/uL — ABNORMAL LOW (ref 3.87–5.11)
RDW: 15.2 % (ref 11.5–15.5)
WBC: 9.8 10*3/uL (ref 4.0–10.5)
nRBC: 1 % — ABNORMAL HIGH (ref 0.0–0.2)

## 2020-08-16 LAB — BASIC METABOLIC PANEL
Anion gap: 7 (ref 5–15)
BUN: 10 mg/dL (ref 8–23)
CO2: 23 mmol/L (ref 22–32)
Calcium: 9.1 mg/dL (ref 8.9–10.3)
Chloride: 105 mmol/L (ref 98–111)
Creatinine, Ser: 0.52 mg/dL (ref 0.44–1.00)
GFR, Estimated: >60 mL/min (ref >60)
Glucose, Bld: 218 mg/dL — ABNORMAL HIGH (ref 70–99)
Potassium: 3.5 mmol/L (ref 3.5–5.1)
Sodium: 135 mmol/L (ref 135–145)

## 2020-08-16 LAB — GLUCOSE, CAPILLARY
Glucose-Capillary: 209 mg/dL — ABNORMAL HIGH (ref 70–99)
Glucose-Capillary: 205 mg/dL — ABNORMAL HIGH (ref 70–99)
Glucose-Capillary: 221 mg/dL — ABNORMAL HIGH (ref 70–99)
Glucose-Capillary: 251 mg/dL — ABNORMAL HIGH (ref 70–99)
Glucose-Capillary: 188 mg/dL — ABNORMAL HIGH (ref 70–99)

## 2020-08-16 MED ORDER — ACETAMINOPHEN 325 MG PO TABS
650.0000 mg | ORAL_TABLET | Freq: Four times a day (QID) | ORAL | Status: DC | PRN
  Administered 2020-08-16 – 2020-08-18 (×3): 650 mg via ORAL
  Filled 2020-08-16 (×3): qty 2

## 2020-08-16 MED ORDER — BOOST / RESOURCE BREEZE PO LIQD CUSTOM
1.0000 | Freq: Three times a day (TID) | ORAL | Status: DC
  Administered 2020-08-16 – 2020-08-17 (×4): 1 via ORAL

## 2020-08-16 NOTE — Progress Notes (Signed)
PROGRESS NOTE    [name] [name] [place]  QIH:[id number] DOB: 08-11-1945 DOA: 08/08/2020 PCP: Pcp, No   Chief Complain: Abdominal pain  Brief Narrative: Patient is a 75 year old female with history of diabetes type 2, hypertension, recently relocated from [place] who presented to the emergency department with complaints of abdominal pain.  CT abdomen on presentation showed gas in the upper abdomen with some inflammation at the pylorus/duodenal region suspicious for perforated ulcer.  Patient was admitted under general surgery service and she underwent omental Graham's patch on 08/09/2020.  She started having bowel movements, NG tube removed.   Liver biopsy confirmed hepatocellular carcinoma.  Oncology consulted.  On soft diet.  Discharge planning as per surgery.  Assessment & Plan:   Principal Problem:   Perforated gastric ulcer s/p lap omental Phillip Heal patch 08/09/2020 Active Problems:   Pneumoperitoneum   History of colorectal cancer   Liver mass, left lobe   Insulin-requiring or dependent type II diabetes mellitus (HCC)   HTN (hypertension)   Constipation, chronic   Hyperglycemia   Hepatocellular carcinoma ([place])   Perforated gastric ulcer: Presented with abdominal pain.  Found to have perforated gastric ulcer.  General surgery did laparoscopic omental Graham's patch on 07/2470.  She was on IV PPI, IV antibiotics( zosyn), IV hydration.   Antibiotics discontinued ,NG tube removed upper GI series with contrast negative for leak.  Currently on soft diet General surgery recommending outpatient GI follow-up for endoscopy in 6-8 weeks H. pylori  positive.  Started on triple therapy.  Liver mass/history of colon cancer:  Status post core biopsy of the liver mass.  She has history of remote colon cancer, currently in remission.  Liver biopsy confirmed hepatocellular carcinoma. Elevated  alpha-fetoprotein, MRI showed multiple liver lesions.  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  GI recommended outpatient follow-up for treatment.  Pulmonary edema: Chest x-ray showed bilateral perihilar interstitial pulmonary infiltrates/small bilateral pleural effusion.  Given 2 doses of Lasix IV 40 mg once.    Elevated BNP.  Echo ordered.  She does not appear volume overloaded during my evaluation.  She does not have peripheral edema.  Currently on room air.  Echo showed ejection fraction of 87-56%, grade 1 diastolic dysfunction  Normocytic anemia/acute blood loss anemia: Most likely secondary to acute blood loss from surgery.  Hemoglobin in the range of 8 today.  We recommend blood transfusion if hemoglobin drops less than 7.  Hyponatremia/hypokalemia/hypomagnesemia: Continue to monitor and supplement  AKI on CKD stage II: AKI resolved with iv fluids  Diabetes type 2: Hemoglobin A1c of 5.8.  On sliding scale insulin  Hypertension: On nifedipine, Cozaar/hydrochlorothiazide at home.  Oral medications on hold.  Glaucoma: Continue home eyedrops            DVT prophylaxis:SCD Code Status: Full   Procedures:As above  Antimicrobials:  Anti-infectives (From admission, onward)    Start     Dose/Rate Route Frequency Ordered Stop   08/13/20 2200  clarithromycin (BIAXIN) tablet 500 mg        500 mg Oral Every 12 hours 08/13/20 1627 08/27/20 2159   08/13/20 2200  amoxicillin (AMOXIL) capsule 1,000 mg        1,000 mg Oral Every 12 hours 08/13/20 1627 08/27/20 2159   08/09/20 1000  piperacillin-tazobactam (ZOSYN) IVPB 3.375 g  Status:  Discontinued        3.375 g 12.5 mL/hr over 240 Minutes Intravenous Every  8 hours 08/09/20 0407 08/13/20 1627   08/09/20 0600  cefoTEtan (CEFOTAN) 2 g in sodium chloride 0.9 % 100 mL IVPB        2 g 200 mL/hr over 30 Minutes Intravenous On call to O.R. 08/09/20 0418 08/09/20 0659   08/09/20 0300  piperacillin-tazobactam (ZOSYN) IVPB 3.375 g         3.375 g 100 mL/hr over 30 Minutes Intravenous  Once 08/09/20 0253 08/09/20 0410       Subjective:  Patient seen and examined the bedside this morning.  Hemodynamically stable.  Sitting on the chair.  No bowel movement yesterday or today.  Denies any abdominal pain.  No nausea or vomiting   Objective: Vitals:   08/15/20 1300 08/15/20 2225 08/16/20 0511 08/16/20 0512  BP: (!) 147/75 113/69 (!) 142/75   Pulse: 92 98 (!) 109 (!) 105  Resp: 18 17 18    Temp: 99.8 F (37.7 C) 99.4 F (37.4 C) 98.8 F (37.1 C)   TempSrc: Oral Oral Oral   SpO2: 98% (!) 83% (!) 89% 90%  Weight:      Height:        Intake/Output Summary (Last 24 hours) at 08/16/2020 1117 Last data filed at 08/16/2020 0200 Gross per 24 hour  Intake 1410.52 ml  Output --  Net 1410.52 ml   Filed Weights   08/09/20 1500  Weight: 65 kg    Examination:  General exam: Overall comfortable, not in distress HEENT: PERRL Respiratory system:  no wheezes or crackles  Cardiovascular system: S1 & S2 heard, RRR.  Gastrointestinal system: Abdomen is mildly distended, soft and nontender. Central nervous system: Alert and oriented Extremities: No edema, no clubbing ,no cyanosis Skin: No rashes, no ulcers,no icterus       Data Reviewed: I have personally reviewed following labs and imaging studies  CBC: Recent Labs  Lab 08/10/20 0548 08/11/20 0419 08/12/20 0407 08/13/20 0433 08/14/20 0428 08/15/20 0345 08/16/20 0434  WBC 9.3   < > 7.8 9.2 9.8 9.9 9.8  NEUTROABS 6.6  --   --   --  6.4  --   --   HGB 7.6*   < > 7.7* 8.6* 8.1* 8.5* 8.6*  HCT 22.2*   < > 23.2* 26.1* 24.3* 26.5* 26.2*  MCV 91.7   < > 92.4 93.5 92.0 94.0 93.6  PLT 189   < > 160 217 227 244 273   < > = values in this interval not displayed.   Basic Metabolic Panel: Recent Labs  Lab 08/10/20 0413 08/11/20 0419 08/12/20 0407 08/13/20 0433 08/14/20 0428 08/15/20 0345 08/16/20 0434  NA 137  135 139 142 141 140 138 135  K 3.8  3.7 3.4* 3.7  3.2* 3.2* 3.3* 3.5  CL 104  105 107 111 109 107 108 105  CO2 25  24 25 25 22 26 24 23   GLUCOSE 213*  212* 155* 120* 142* 124* 185* 218*  BUN 37*  38* 23 16 14 16 13 10   CREATININE 1.41*  1.50* 0.79 0.65 0.73 0.57 0.56 0.52  CALCIUM 8.5*  8.3* 9.0 8.9 9.3 9.1 9.1 9.1  MG 1.6* 2.5*  --   --   --   --   --   PHOS 2.5  2.5  --   --   --   --   --   --    GFR: Estimated Creatinine Clearance: 53.3 mL/min (by C-G formula based on SCr of 0.52 mg/dL). Liver Function Tests: Recent  Labs  Lab 08/10/20 0413  AST 90*  ALT 87*  ALKPHOS 70  BILITOT 0.6  PROT 5.3*  ALBUMIN 2.6*  2.6*   No results for input(s): LIPASE, AMYLASE in the last 168 hours.  No results for input(s): AMMONIA in the last 168 hours. Coagulation Profile: No results for input(s): INR, PROTIME in the last 168 hours. Cardiac Enzymes: No results for input(s): CKTOTAL, CKMB, CKMBINDEX, TROPONINI in the last 168 hours. BNP (last 3 results) No results for input(s): PROBNP in the last 8760 hours. HbA1C: No results for input(s): HGBA1C in the last 72 hours.  CBG: Recent Labs  Lab 08/15/20 1631 08/15/20 1957 08/15/20 2341 08/16/20 0508 08/16/20 0741  GLUCAP 201* 176* 200* 209* 205*   Lipid Profile: No results for input(s): CHOL, HDL, LDLCALC, TRIG, CHOLHDL, LDLDIRECT in the last 72 hours. Thyroid Function Tests: No results for input(s): TSH, T4TOTAL, FREET4, T3FREE, THYROIDAB in the last 72 hours. Anemia Panel: No results for input(s): VITAMINB12, FOLATE, FERRITIN, TIBC, IRON, RETICCTPCT in the last 72 hours. Sepsis Labs: No results for input(s): PROCALCITON, LATICACIDVEN in the last 168 hours.  Recent Results (from the past 240 hour(s))  Resp Panel by RT-PCR (Flu A&B, Covid) Nasopharyngeal Swab     Status: None   Collection Time: 08/08/20 11:35 PM   Specimen: Nasopharyngeal Swab; Nasopharyngeal(NP) swabs in vial transport medium  Result Value Ref Range Status   SARS Coronavirus 2 by RT PCR NEGATIVE  NEGATIVE Final    Comment: (NOTE) SARS-CoV-2 target nucleic acids are NOT DETECTED.  The SARS-CoV-2 RNA is generally detectable in upper respiratory specimens during the acute phase of infection. The lowest concentration of SARS-CoV-2 viral copies this assay can detect is 138 copies/mL. A negative result does not preclude SARS-Cov-2 infection and should not be used as the sole basis for treatment or other patient management decisions. A negative result may occur with  improper specimen collection/handling, submission of specimen other than nasopharyngeal swab, presence of viral mutation(s) within the areas targeted by this assay, and inadequate number of viral copies(<138 copies/mL). A negative result must be combined with clinical observations, patient history, and epidemiological information. The expected result is Negative.  Fact Sheet for Patients:  EntrepreneurPulse.com.au  Fact Sheet for Healthcare Providers:  IncredibleEmployment.be  This test is no t yet approved or cleared by the [place] FDA and  has been authorized for detection and/or diagnosis of SARS-CoV-2 by FDA under an Emergency Use Authorization (EUA). This EUA will remain  in effect (meaning this test can be used) for the duration of the COVID-19 declaration under Section 564(b)(1) of the Act, 21 U.S.C.section 360bbb-3(b)(1), unless the authorization is terminated  or revoked sooner.       Influenza A by PCR NEGATIVE NEGATIVE Final   Influenza B by PCR NEGATIVE NEGATIVE Final    Comment: (NOTE) The Xpert Xpress SARS-CoV-2/FLU/RSV plus assay is intended as an aid in the diagnosis of influenza from Nasopharyngeal swab specimens and should not be used as a sole basis for treatment. Nasal washings and aspirates are unacceptable for Xpert Xpress SARS-CoV-2/FLU/RSV testing.  Fact Sheet for Patients: EntrepreneurPulse.com.au  Fact Sheet for Healthcare  Providers: IncredibleEmployment.be  This test is not yet approved or cleared by the [place] FDA and has been authorized for detection and/or diagnosis of SARS-CoV-2 by FDA under an Emergency Use Authorization (EUA). This EUA will remain in effect (meaning this test can be used) for the duration of the COVID-19 declaration under Section 564(b)(1) of the Act, 21  U.S.C. section 360bbb-3(b)(1), unless the authorization is terminated or revoked.  Performed at [facility], [place] [address] [place], [place] [address]          Radiology Studies: MR ABDOMEN W WO CONTRAST  Result Date: 08/15/2020 CLINICAL DATA:  Recent gastric perforation, treated with Sander Nephew. Hepatocellular carcinoma, biopsy at time of perforation repair. EXAM: MRI ABDOMEN WITHOUT AND WITH CONTRAST TECHNIQUE: Multiplanar multisequence MR imaging of the abdomen was performed both before and after the administration of intravenous contrast. CONTRAST:  6.64mL GADAVIST GADOBUTROL 1 MMOL/ML IV SOLN COMPARISON:  Abdominal CT 08/09/2020 FINDINGS: Lower chest: Subcutaneous edema in the chest and abdomen. Small bilateral pleural effusions with passive atelectasis. 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 Some of these lesions have high precontrast T1 signal characteristics internally indicating blood products. The lesions demonstrate arterial phase enhancement along with washout and are biopsy proven to be hepatocellular carcinoma. While most of these lesions are observed in the left hepatic lobe, on image 50 of series 16 there appears to be a 1.5 by 1.1 cm rim enhancing lesion in segment 5 of the liver adjacent to the gallbladder, although admittedly this is right along the  liver margin. Accounting for the motion artifact, I do not see well-defined tumor thrombus in the portal venous system or hepatic venous system. A gallstone is noted in the gallbladder. Pancreas:  Unremarkable Spleen:  Unremarkable Adrenals/Urinary Tract:  Unremarkable Stomach/Bowel: Indistinct margins of the stomach and proximal duodenum in the vicinity of the recent repair. Surrounding edema and a small amount of ascites in the upper abdomen noted. No new free air is identified. Vascular/Lymphatic:  Unremarkable Other: Mesenteric and omental edema in the upper abdomen along with mild upper abdominal ascites. Diffuse subcutaneous edema. Musculoskeletal: Unremarkable IMPRESSION: 1. Numerous masses in the left hepatic lobe measuring up to 7.2 cm in diameter compatible with multifocal hepatocellular carcinoma. There is also probably a lesion in segment 5 of the liver representing the only identified right hepatic lobe involvement. 2. Third spacing of fluid with subcutaneous, mesenteric, and omental edema along with upper abdominal ascites. Small bilateral pleural effusions. 3. Cholelithiasis. Electronically Signed   By: Van Clines M.D.   On: 08/15/2020 21:35   ECHOCARDIOGRAM COMPLETE  Result Date: 08/14/2020    ECHOCARDIOGRAM REPORT   Patient Name:   ANHTHU PERDEW [facility] Date of Exam: 08/14/2020 Medical Rec #:  [id number]                   Height:       64.0 in Accession #:    [id number]                  Weight:       143.3 lb Date of Birth:  Dec 27, 1945                    BSA:          1.698 m Patient Age:    32 years                    BP:           146/70 mmHg Patient Gender: F  HR:           93 bpm. Exam Location:  Inpatient Procedure: 2D Echo, Cardiac Doppler and Color Doppler Indications:     Dyspnea  History:         Patient has prior history of Echocardiogram examinations and                  Patient has no prior history of Echocardiogram examinations.                   Risk Factors:Hypertension and Diabetes.  Sonographer:     [place] Referring Phys:  [id number] Younique Casad Diagnosing Phys: Eleonore Chiquito MD  Sonographer Comments: Suboptimal parasternal window and suboptimal subcostal window. IMPRESSIONS  1. Left ventricular ejection fraction, by estimation, is 65 to 70%. The left ventricle has normal function. The left ventricle has no regional wall motion abnormalities. Left ventricular diastolic parameters are consistent with Grade I diastolic dysfunction (impaired relaxation).  2. Right ventricular systolic function is normal. The right ventricular size is normal.  3. Left atrial size was mildly dilated.  4. The mitral valve is grossly normal. Trivial mitral valve regurgitation. No evidence of mitral stenosis.  5. The aortic valve is tricuspid. Aortic valve regurgitation is not visualized. No aortic stenosis is present. FINDINGS  Left Ventricle: Left ventricular ejection fraction, by estimation, is 65 to 70%. The left ventricle has normal function. The left ventricle has no regional wall motion abnormalities. The left ventricular internal cavity size was normal in size. There is  no left ventricular hypertrophy. Left ventricular diastolic parameters are consistent with Grade I diastolic dysfunction (impaired relaxation). Right Ventricle: The right ventricular size is normal. No increase in right ventricular wall thickness. Right ventricular systolic function is normal. Left Atrium: Left atrial size was mildly dilated. Right Atrium: Right atrial size was normal in size. Pericardium: Trivial pericardial effusion is present. Mitral Valve: The mitral valve is grossly normal. Trivial mitral valve regurgitation. No evidence of mitral valve stenosis. Tricuspid Valve: The tricuspid valve is grossly normal. Tricuspid valve regurgitation is trivial. No evidence of tricuspid stenosis. Aortic Valve: The aortic valve is tricuspid. Aortic valve regurgitation is not visualized. No  aortic stenosis is present. Aortic valve mean gradient measures 6.0 mmHg. Aortic valve peak gradient measures 10.4 mmHg. Aortic valve area, by VTI measures 3.03  cm. Pulmonic Valve: The pulmonic valve was grossly normal. Pulmonic valve regurgitation is not visualized. No evidence of pulmonic stenosis. Aorta: The aortic root is normal in size and structure. Venous: The inferior vena cava was not well visualized. IAS/Shunts: The atrial septum is grossly normal.  LEFT VENTRICLE PLAX 2D LVIDd:         4.60 cm     Diastology LVIDs:         2.30 cm     LV e' medial:    5.98 cm/s LV PW:         1.20 cm     LV E/e' medial:  12.1 LV IVS:        1.10 cm     LV e' lateral:   10.60 cm/s LVOT diam:     2.20 cm     LV E/e' lateral: 6.8 LV SV:         86 LV SV Index:   50 LVOT Area:     3.80 cm  LV Volumes (MOD) LV vol d, MOD A2C: 48.6 ml LV vol d, MOD A4C: 58.3 ml LV vol s, MOD  A2C: 27.7 ml LV vol s, MOD A4C: 15.5 ml LV SV MOD A2C:     20.9 ml LV SV MOD A4C:     58.3 ml LV SV MOD BP:      35.9 ml RIGHT VENTRICLE RV Basal diam:  3.50 cm RV Mid diam:    2.60 cm RV S prime:     20.50 cm/s TAPSE (M-mode): 2.4 cm LEFT ATRIUM             Index       RIGHT ATRIUM           Index LA diam:        3.00 cm 1.77 cm/m  RA Area:     19.00 cm LA Vol (A2C):   82.6 ml 48.65 ml/m RA Volume:   56.90 ml  33.51 ml/m LA Vol (A4C):   67.7 ml 39.87 ml/m LA Biplane Vol: 77.2 ml 45.47 ml/m  AORTIC VALVE                    PULMONIC VALVE AV Area (Vmax):    2.97 cm     PV Vmax:       0.93 m/s AV Area (Vmean):   2.90 cm     PV Vmean:      60.100 cm/s AV Area (VTI):     3.03 cm     PV VTI:        0.160 m AV Vmax:           161.00 cm/s  PV Peak grad:  3.5 mmHg AV Vmean:          110.000 cm/s PV Mean grad:  2.0 mmHg AV VTI:            0.282 m AV Peak Grad:      10.4 mmHg AV Mean Grad:      6.0 mmHg LVOT Vmax:         126.00 cm/s LVOT Vmean:        83.900 cm/s LVOT VTI:          0.225 m LVOT/AV VTI ratio: 0.80  AORTA Ao Root diam: 3.30 cm MITRAL VALVE                  TRICUSPID VALVE MV Area (PHT): 4.57 cm      TR Peak grad:   23.4 mmHg MV Decel Time: 166 msec      TR Vmax:        242.00 cm/s MR Peak grad:    113.2 mmHg MR Vmax:         532.00 cm/s SHUNTS MR PISA:         0.57 cm    Systemic VTI:  0.22 m MR PISA Eff ROA: 2 mm       Systemic Diam: 2.20 cm MR PISA Radius:  0.30 cm MV E velocity: 72.20 cm/s MV A velocity: 84.20 cm/s MV E/A ratio:  0.86 Eleonore Chiquito MD Electronically signed by Eleonore Chiquito MD Signature Date/Time: 08/14/2020/3:09:21 PM    Final (Updated)         Scheduled Meds:  sodium chloride   Intravenous Once   amoxicillin  1,000 mg Oral Q12H   bisacodyl  10 mg Rectal Daily   Chlorhexidine Gluconate Cloth  6 each Topical Daily   clarithromycin  500 mg Oral Q12H   enoxaparin (LOVENOX) injection  40 mg Subcutaneous Q24H   feeding supplement  1 Container Oral TID BM  insulin aspart  0-15 Units Subcutaneous Q4H   lip balm  1 application Topical BID   pantoprazole  40 mg Intravenous Q12H   pneumococcal 23 valent vaccine  0.5 mL Intramuscular Tomorrow-1000   Continuous Infusions:  sodium chloride 10 mL/hr at 08/10/20 1800   lactated ringers 50 mL/hr at 08/15/20 2358   methocarbamol (ROBAXIN) IV     ondansetron (ZOFRAN) IV       LOS: 7 days    Time spent: 25 mins.More than 50% of that time was spent in counseling and/or coordination of care.      Shelly Coss, MD [facility] P7/03/2020, 11:17 AM

## 2020-08-16 NOTE — Plan of Care (Signed)
  Problem: Clinical Measurements: Goal: Ability to maintain clinical measurements within normal limits will improve Outcome: Progressing   Problem: Clinical Measurements: Goal: Postoperative complications will be avoided or minimized Outcome: Progressing   Problem: Skin Integrity: Goal: Demonstration of wound healing without infection will improve Outcome: Progressing

## 2020-08-16 NOTE — TOC Transition Note (Addendum)
Transition of Care [facility]) - CM/SW Discharge Note   Patient Details  Name: Turquoise Esch MRN: [id number] Date of Birth: May 23, 1945  Transition of Care [facility]) CM/SW Contact:  Leeroy Cha, RN Phone Number: 08/16/2020, 2:11 PM   Clinical Narrative:    [place] for this week  patient medicaid pending.wcb after checking availability of service [facility] will take patient for pt service.  Final next level of care: Home/Self Care Barriers to Discharge: Continued Medical Work up, Inadequate or no insurance   Patient Goals and CMS Choice Patient states their goals for this hospitalization and ongoing recovery are:: Get insurance CMS Medicare.gov Compare Post Acute Care list provided to:: Patient Represenative (must comment) Choice offered to / list presented to : Patient, Adult Children  Discharge Placement                       Discharge Plan and Services In-house Referral: Clinical Social Work Discharge Planning Services: [facility] Post Acute Care Choice: NA          DME Arranged: rolling walker DME Agency: [employer]                  Social Determinants of Health (SDOH) Interventions     Readmission Risk Interventions No flowsheet data found.

## 2020-08-16 NOTE — Progress Notes (Signed)
7 Days Post-Op  Subjective: CC: Feels well this morning. Did not eat yesterday due to fasting for progressively delayed MRI, and overnight was given a sandwich, which she ate half of without sequelae   Objective: Vital signs in last 24 hours: Temp:  [98.8 F (37.1 C)-99.8 F (37.7 C)] 98.8 F (37.1 C) (07/02 0511) Pulse Rate:  [92-109] 105 (07/02 0512) Resp:  [17-18] 18 (07/02 0511) BP: (113-147)/(69-75) 142/75 (07/02 0511) SpO2:  [83 %-98 %] 90 % (07/02 0512) Last BM Date: 08/13/20  Intake/Output from previous day: 07/01 0701 - 07/02 0700 In: 2606.2 [P.O.:920; I.V.:1686.2] Out: -  Intake/Output this shift: No intake/output data recorded.  PE: Gen:  Alert, NAD, pleasant Card: RRR Pulm:  CTAB, no W/R/R, effort normal. On RA.  Abd: Soft, minimal to no distension today. Mild epigastric tenderness that appears appropriate in the postoperative setting. Otherwise NT. No peritonitis. Laparoscopic incisions c/d/I. + bowel sounds. Noted prior midline wound that is well healed. Ext:  No LE edema or calf tenderness Psych: A&Ox3 Skin: no rashes noted, warm and dry  Lab Results:  Recent Labs    08/15/20 0345 08/16/20 0434  WBC 9.9 9.8  HGB 8.5* 8.6*  HCT 26.5* 26.2*  PLT 244 273    BMET Recent Labs    08/15/20 0345 08/16/20 0434  NA 138 135  K 3.3* 3.5  CL 108 105  CO2 24 23  GLUCOSE 185* 218*  BUN 13 10  CREATININE 0.56 0.52  CALCIUM 9.1 9.1    PT/INR No results for input(s): LABPROT, INR in the last 72 hours. CMP     Component Value Date/Time   NA 135 08/16/2020 0434   K 3.5 08/16/2020 0434   CL 105 08/16/2020 0434   CO2 23 08/16/2020 0434   GLUCOSE 218 (H) 08/16/2020 0434   BUN 10 08/16/2020 0434   CREATININE 0.52 08/16/2020 0434   CALCIUM 9.1 08/16/2020 0434   PROT 5.3 (L) 08/10/2020 0413   ALBUMIN 2.6 (L) 08/10/2020 0413   ALBUMIN 2.6 (L) 08/10/2020 0413   AST 90 (H) 08/10/2020 0413   ALT 87 (H) 08/10/2020 0413   ALKPHOS 70 08/10/2020 0413    BILITOT 0.6 08/10/2020 0413   GFRNONAA >60 08/16/2020 0434   Lipase     Component Value Date/Time   LIPASE 46 08/08/2020 2224       Studies/Results: MR ABDOMEN W WO CONTRAST  Result Date: 08/15/2020 CLINICAL DATA:  Recent gastric perforation, treated with Phillip Heal pouch. Hepatocellular carcinoma, biopsy at time of perforation repair. EXAM: MRI ABDOMEN WITHOUT AND WITH CONTRAST TECHNIQUE: Multiplanar multisequence MR imaging of the abdomen was performed both before and after the administration of intravenous contrast. CONTRAST:  6.49mL GADAVIST GADOBUTROL 1 MMOL/ML IV SOLN COMPARISON:  Abdominal CT 08/09/2020 FINDINGS: Lower chest: Subcutaneous edema in the chest and abdomen. Small bilateral pleural effusions with passive atelectasis. 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 Some of these lesions have high precontrast T1 signal characteristics internally indicating blood products. The lesions demonstrate arterial phase enhancement along with washout and are biopsy proven to be hepatocellular carcinoma. While most of these lesions are observed in the left hepatic lobe, on image 50 of series 16 there appears to be a 1.5 by 1.1 cm rim enhancing lesion in segment  5 of the liver adjacent to the gallbladder, although admittedly this is right along the liver margin. Accounting for the motion artifact, I do not see well-defined tumor thrombus in the portal venous system or hepatic venous system. A gallstone is noted in the gallbladder. Pancreas:  Unremarkable Spleen:  Unremarkable Adrenals/Urinary Tract:  Unremarkable Stomach/Bowel: Indistinct margins of the stomach and proximal duodenum in the vicinity of the recent repair. Surrounding edema and a small amount of ascites in the upper  abdomen noted. No new free air is identified. Vascular/Lymphatic:  Unremarkable Other: Mesenteric and omental edema in the upper abdomen along with mild upper abdominal ascites. Diffuse subcutaneous edema. Musculoskeletal: Unremarkable IMPRESSION: 1. Numerous masses in the left hepatic lobe measuring up to 7.2 cm in diameter compatible with multifocal hepatocellular carcinoma. There is also probably a lesion in segment 5 of the liver representing the only identified right hepatic lobe involvement. 2. Third spacing of fluid with subcutaneous, mesenteric, and omental edema along with upper abdominal ascites. Small bilateral pleural effusions. 3. Cholelithiasis. Electronically Signed   By: Van Clines M.D.   On: 08/15/2020 21:35   ECHOCARDIOGRAM COMPLETE  Result Date: 08/14/2020    ECHOCARDIOGRAM REPORT   Patient Name:   [name] [name] [facility] Date of Exam: 08/14/2020 Medical Rec #:  [id number]                   Height:       64.0 in Accession #:    [id number]                  Weight:       143.3 lb Date of Birth:  April 07, 1945                    BSA:          1.698 m Patient Age:    75 years                    BP:           146/70 mmHg Patient Gender: F                           HR:           93 bpm. Exam Location:  Inpatient Procedure: 2D Echo, Cardiac Doppler and Color Doppler Indications:     Dyspnea  History:         Patient has prior history of Echocardiogram examinations and                  Patient has no prior history of Echocardiogram examinations.                  Risk Factors:Hypertension and Diabetes.  Sonographer:     Hobbs Referring Phys:  [id number] AMRIT ADHIKARI Diagnosing Phys: Eleonore Chiquito MD  Sonographer Comments: Suboptimal parasternal window and suboptimal subcostal window. IMPRESSIONS  1. Left ventricular ejection fraction, by estimation, is 65 to 70%. The left ventricle has normal function. The left ventricle has no regional wall motion abnormalities. Left ventricular  diastolic parameters are consistent with Grade I diastolic dysfunction (impaired relaxation).  2. Right ventricular systolic function is normal. The right ventricular size is normal.  3. Left atrial size was mildly dilated.  4. The mitral valve is grossly normal. Trivial mitral valve regurgitation. No evidence of mitral stenosis.  5. The aortic valve is tricuspid. Aortic valve  regurgitation is not visualized. No aortic stenosis is present. FINDINGS  Left Ventricle: Left ventricular ejection fraction, by estimation, is 65 to 70%. The left ventricle has normal function. The left ventricle has no regional wall motion abnormalities. The left ventricular internal cavity size was normal in size. There is  no left ventricular hypertrophy. Left ventricular diastolic parameters are consistent with Grade I diastolic dysfunction (impaired relaxation). Right Ventricle: The right ventricular size is normal. No increase in right ventricular wall thickness. Right ventricular systolic function is normal. Left Atrium: Left atrial size was mildly dilated. Right Atrium: Right atrial size was normal in size. Pericardium: Trivial pericardial effusion is present. Mitral Valve: The mitral valve is grossly normal. Trivial mitral valve regurgitation. No evidence of mitral valve stenosis. Tricuspid Valve: The tricuspid valve is grossly normal. Tricuspid valve regurgitation is trivial. No evidence of tricuspid stenosis. Aortic Valve: The aortic valve is tricuspid. Aortic valve regurgitation is not visualized. No aortic stenosis is present. Aortic valve mean gradient measures 6.0 mmHg. Aortic valve peak gradient measures 10.4 mmHg. Aortic valve area, by VTI measures 3.03  cm. Pulmonic Valve: The pulmonic valve was grossly normal. Pulmonic valve regurgitation is not visualized. No evidence of pulmonic stenosis. Aorta: The aortic root is normal in size and structure. Venous: The inferior vena cava was not well visualized. IAS/Shunts: The atrial  septum is grossly normal.  LEFT VENTRICLE PLAX 2D LVIDd:         4.60 cm     Diastology LVIDs:         2.30 cm     LV e' medial:    5.98 cm/s LV PW:         1.20 cm     LV E/e' medial:  12.1 LV IVS:        1.10 cm     LV e' lateral:   10.60 cm/s LVOT diam:     2.20 cm     LV E/e' lateral: 6.8 LV SV:         86 LV SV Index:   50 LVOT Area:     3.80 cm  LV Volumes (MOD) LV vol d, MOD A2C: 48.6 ml LV vol d, MOD A4C: 58.3 ml LV vol s, MOD A2C: 27.7 ml LV vol s, MOD A4C: 15.5 ml LV SV MOD A2C:     20.9 ml LV SV MOD A4C:     58.3 ml LV SV MOD BP:      35.9 ml RIGHT VENTRICLE RV Basal diam:  3.50 cm RV Mid diam:    2.60 cm RV S prime:     20.50 cm/s TAPSE (M-mode): 2.4 cm LEFT ATRIUM             Index       RIGHT ATRIUM           Index LA diam:        3.00 cm 1.77 cm/m  RA Area:     19.00 cm LA Vol (A2C):   82.6 ml 48.65 ml/m RA Volume:   56.90 ml  33.51 ml/m LA Vol (A4C):   67.7 ml 39.87 ml/m LA Biplane Vol: 77.2 ml 45.47 ml/m  AORTIC VALVE                    PULMONIC VALVE AV Area (Vmax):    2.97 cm     PV Vmax:       0.93 m/s AV Area (Vmean):   2.90 cm  PV Vmean:      60.100 cm/s AV Area (VTI):     3.03 cm     PV VTI:        0.160 m AV Vmax:           161.00 cm/s  PV Peak grad:  3.5 mmHg AV Vmean:          110.000 cm/s PV Mean grad:  2.0 mmHg AV VTI:            0.282 m AV Peak Grad:      10.4 mmHg AV Mean Grad:      6.0 mmHg LVOT Vmax:         126.00 cm/s LVOT Vmean:        83.900 cm/s LVOT VTI:          0.225 m LVOT/AV VTI ratio: 0.80  AORTA Ao Root diam: 3.30 cm MITRAL VALVE                 TRICUSPID VALVE MV Area (PHT): 4.57 cm      TR Peak grad:   23.4 mmHg MV Decel Time: 166 msec      TR Vmax:        242.00 cm/s MR Peak grad:    113.2 mmHg MR Vmax:         532.00 cm/s SHUNTS MR PISA:         0.57 cm    Systemic VTI:  0.22 m MR PISA Eff ROA: 2 mm       Systemic Diam: 2.20 cm MR PISA Radius:  0.30 cm MV E velocity: 72.20 cm/s MV A velocity: 84.20 cm/s MV E/A ratio:  0.86 Eleonore Chiquito MD  Electronically signed by Eleonore Chiquito MD Signature Date/Time: 08/14/2020/3:09:21 PM    Final (Updated)     Anti-infectives: Anti-infectives (From admission, onward)    Start     Dose/Rate Route Frequency Ordered Stop   08/13/20 2200  clarithromycin (BIAXIN) tablet 500 mg        500 mg Oral Every 12 hours 08/13/20 1627 08/27/20 2159   08/13/20 2200  amoxicillin (AMOXIL) capsule 1,000 mg        1,000 mg Oral Every 12 hours 08/13/20 1627 08/27/20 2159   08/09/20 1000  piperacillin-tazobactam (ZOSYN) IVPB 3.375 g  Status:  Discontinued        3.375 g 12.5 mL/hr over 240 Minutes Intravenous Every 8 hours 08/09/20 0407 08/13/20 1627   08/09/20 0600  cefoTEtan (CEFOTAN) 2 g in sodium chloride 0.9 % 100 mL IVPB        2 g 200 mL/hr over 30 Minutes Intravenous On call to O.R. 08/09/20 0418 08/09/20 0659   08/09/20 0300  piperacillin-tazobactam (ZOSYN) IVPB 3.375 g        3.375 g 100 mL/hr over 30 Minutes Intravenous  Once 08/09/20 0253 08/09/20 0410        Assessment/Plan POD 7 s/p diagnostic laparoscopy, LOA, omental Phillip Heal patch of perforated ulcer, core liver biopsy for perforated stomach ulcer, liver masses and history of colon cancer-Dr. Gross-08/09/20 - UGI negative for leak. On FLD. Will try soft diet later today. - H. Pylori positive - per GI 14 days with clarithromycin 500 mg PO BID and amoxicillin 1000 mg PO BID. PPI BID for a minimum of 2 months - Mobilize. [place] [facility]  - Pulm toilet. - Will need outpatient GI f/u for endoscopy to ensure ulcer has healed in 6-8 weeks   Path with Hepatocellular carcinoma -  Discussed with patient and her daughter. Dr. Benay Spice of oncology has been consulted. MRI last night with multiple lesions    FEN - FLD, IVF per [facility] (currently on 50cc/hr), replace K (3.3) VTE - SCDs, lvx ID - Zosyn 6/25 - 6/29. Amox/Clinda 6/29 >> Foley - out Follow-Up - Dr. Johney Maine. GI. Oncology    Appreciate [facility]'s assistance Hep C Ab reactive - GI consulted. They have ordered  HCV RNA. If positive recommend HCV fibrosure, HCV genotype (non-reflex), and HBsAg for screening and outpatient follow-up. Notes hx of prior transfusion in [place] ABL anemia - hgb stable Hypoxia - CXR earlier in the week with progressive cardiogenic failure. BNP elevated. Received IV lasix with improvement. No hypoxia overnight. [facility] has ordered echo - EF 65-70% w/ G1DD and no wall motion abnormalities  Reported hx of colon cancer AKI on CKD3 - AKI resolved DM2 - SSI HTN - Appreciate [facility] assistance. PRN IV Hydralazine    LOS: 7 days    Clovis Riley , MD [facility] 08/16/2020, 8:43 AM Please see Amion for pager number during day hours 7:00am-4:30pm

## 2020-08-17 LAB — GLUCOSE, CAPILLARY
Glucose-Capillary: 145 mg/dL — ABNORMAL HIGH (ref 70–99)
Glucose-Capillary: 159 mg/dL — ABNORMAL HIGH (ref 70–99)
Glucose-Capillary: 177 mg/dL — ABNORMAL HIGH (ref 70–99)
Glucose-Capillary: 184 mg/dL — ABNORMAL HIGH (ref 70–99)
Glucose-Capillary: 203 mg/dL — ABNORMAL HIGH (ref 70–99)
Glucose-Capillary: 223 mg/dL — ABNORMAL HIGH (ref 70–99)
Glucose-Capillary: 310 mg/dL — ABNORMAL HIGH (ref 70–99)

## 2020-08-17 NOTE — Plan of Care (Addendum)
18:30 Pt complained of discomfort to abdomen. RN ambulated Pt in the hall with daughter. Noted +bowel sounds, Pt is passing gas and belching, had 2 BMs today. Pt returned to room with relief from abdominal discomfort.   RN encouraged Pt to ambulate and educated on benefits of ambulation.  Problem: Education: Goal: Required Educational Video(s) Outcome: Progressing   Problem: Clinical Measurements: Goal: Ability to maintain clinical measurements within normal limits will improve Outcome: Progressing   Problem: Skin Integrity: Goal: Demonstration of wound healing without infection will improve Outcome: Progressing

## 2020-08-17 NOTE — Progress Notes (Signed)
8 Days Post-Op  Subjective: CC: Feels well this morning. No complaints. Denies n/v.  Objective: Vital signs in last 24 hours: Temp:  [98.8 F (37.1 C)-100.1 F (37.8 C)] 99 F (37.2 C) (07/03 0407) Pulse Rate:  [90-102] 90 (07/03 0407) Resp:  [16-18] 16 (07/03 0407) BP: (129-161)/(64-71) 129/65 (07/03 0407) SpO2:  [91 %-97 %] 91 % (07/03 0407) Last BM Date: 08/14/20  Intake/Output from previous day: 07/02 0701 - 07/03 0700 In: 2099.3 [P.O.:900; I.V.:1199.3] Out: -  Intake/Output this shift: No intake/output data recorded.  PE: Gen:  Alert, NAD, pleasant Card: RRR Pulm:  CTAB, no W/R/R, effort normal. On RA.  Abd: Soft, minimal to no distension today. No tenderness today. Laparoscopic incisions c/d/I. + bowel sounds. Noted prior midline wound that is well healed. Ext:  No LE edema or calf tenderness Psych: A&Ox3 Skin: no rashes noted, warm and dry  Lab Results:  Recent Labs    08/15/20 0345 08/16/20 0434  WBC 9.9 9.8  HGB 8.5* 8.6*  HCT 26.5* 26.2*  PLT 244 273   BMET Recent Labs    08/15/20 0345 08/16/20 0434  NA 138 135  K 3.3* 3.5  CL 108 105  CO2 24 23  GLUCOSE 185* 218*  BUN 13 10  CREATININE 0.56 0.52  CALCIUM 9.1 9.1   PT/INR No results for input(s): LABPROT, INR in the last 72 hours. CMP     Component Value Date/Time   NA 135 08/16/2020 0434   K 3.5 08/16/2020 0434   CL 105 08/16/2020 0434   CO2 23 08/16/2020 0434   GLUCOSE 218 (H) 08/16/2020 0434   BUN 10 08/16/2020 0434   CREATININE 0.52 08/16/2020 0434   CALCIUM 9.1 08/16/2020 0434   PROT 5.3 (L) 08/10/2020 0413   ALBUMIN 2.6 (L) 08/10/2020 0413   ALBUMIN 2.6 (L) 08/10/2020 0413   AST 90 (H) 08/10/2020 0413   ALT 87 (H) 08/10/2020 0413   ALKPHOS 70 08/10/2020 0413   BILITOT 0.6 08/10/2020 0413   GFRNONAA >60 08/16/2020 0434   Lipase     Component Value Date/Time   LIPASE 46 08/08/2020 2224       Studies/Results: MR ABDOMEN W WO CONTRAST  Result Date:  08/15/2020 CLINICAL DATA:  Recent gastric perforation, treated with Phillip Heal pouch. Hepatocellular carcinoma, biopsy at time of perforation repair. EXAM: MRI ABDOMEN WITHOUT AND WITH CONTRAST TECHNIQUE: Multiplanar multisequence MR imaging of the abdomen was performed both before and after the administration of intravenous contrast. CONTRAST:  6.59mL GADAVIST GADOBUTROL 1 MMOL/ML IV SOLN COMPARISON:  Abdominal CT 08/09/2020 FINDINGS: Lower chest: Subcutaneous edema in the chest and abdomen. Small bilateral pleural effusions with passive atelectasis. 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 Some of these lesions have high precontrast T1 signal characteristics internally indicating blood products. The lesions demonstrate arterial phase enhancement along with washout and are biopsy proven to be hepatocellular carcinoma. While most of these lesions are observed in the left hepatic lobe, on image 50 of series 16 there appears to be a 1.5 by 1.1 cm rim enhancing lesion in segment 5 of the liver adjacent to the gallbladder, although admittedly this is right along the liver margin. Accounting for the motion artifact, I do not see well-defined tumor thrombus in the portal venous system  or hepatic venous system. A gallstone is noted in the gallbladder. Pancreas:  Unremarkable Spleen:  Unremarkable Adrenals/Urinary Tract:  Unremarkable Stomach/Bowel: Indistinct margins of the stomach and proximal duodenum in the vicinity of the recent repair. Surrounding edema and a small amount of ascites in the upper abdomen noted. No new free air is identified. Vascular/Lymphatic:  Unremarkable Other: Mesenteric and omental edema in the upper abdomen along with mild upper abdominal ascites. Diffuse subcutaneous edema.  Musculoskeletal: Unremarkable IMPRESSION: 1. Numerous masses in the left hepatic lobe measuring up to 7.2 cm in diameter compatible with multifocal hepatocellular carcinoma. There is also probably a lesion in segment 5 of the liver representing the only identified right hepatic lobe involvement. 2. Third spacing of fluid with subcutaneous, mesenteric, and omental edema along with upper abdominal ascites. Small bilateral pleural effusions. 3. Cholelithiasis. Electronically Signed   By: Van Clines M.D.   On: 08/15/2020 21:35    Anti-infectives: Anti-infectives (From admission, onward)    Start     Dose/Rate Route Frequency Ordered Stop   08/13/20 2200  clarithromycin (BIAXIN) tablet 500 mg        500 mg Oral Every 12 hours 08/13/20 1627 08/27/20 2159   08/13/20 2200  amoxicillin (AMOXIL) capsule 1,000 mg        1,000 mg Oral Every 12 hours 08/13/20 1627 08/27/20 2159   08/09/20 1000  piperacillin-tazobactam (ZOSYN) IVPB 3.375 g  Status:  Discontinued        3.375 g 12.5 mL/hr over 240 Minutes Intravenous Every 8 hours 08/09/20 0407 08/13/20 1627   08/09/20 0600  cefoTEtan (CEFOTAN) 2 g in sodium chloride 0.9 % 100 mL IVPB        2 g 200 mL/hr over 30 Minutes Intravenous On call to O.R. 08/09/20 0418 08/09/20 0659   08/09/20 0300  piperacillin-tazobactam (ZOSYN) IVPB 3.375 g        3.375 g 100 mL/hr over 30 Minutes Intravenous  Once 08/09/20 0253 08/09/20 0410        Assessment/Plan POD 8 s/p diagnostic laparoscopy, LOA, omental Phillip Heal patch of perforated ulcer, core liver biopsy for perforated stomach ulcer, liver masses and history of colon cancer-Dr. Gross-08/09/20 - UGI negative for leak. Soft diet as tolerated - H. Pylori positive - per GI 14 days with clarithromycin 500 mg PO BID and amoxicillin 1000 mg PO BID. PPI BID for a minimum of 2 months - Mobilize. [place] [facility]  - Pulm toilet. - Will need outpatient GI f/u for endoscopy to ensure ulcer has healed in 6-8 weeks   Path with  Hepatocellular carcinoma - Discussed by our service with patient and her daughter. Dr. Benay Spice of oncology has been consulted. MRI last night with multiple lesions    FEN - soft, IVF per [facility] (currently on 50cc/hr), replace K (3.3) VTE - SCDs, lvx ID - Zosyn 6/25 - 6/29. Amox/Clinda 6/29 >> Foley - out Follow-Up - Dr. Johney Maine. GI. Oncology    Appreciate [facility]'s assistance Hep C Ab reactive - GI consulted. They have ordered HCV RNA. If positive recommend HCV fibrosure, HCV genotype (non-reflex), and HBsAg for screening and outpatient follow-up. Notes hx of prior transfusion in [place] ABL anemia - hgb stable Hypoxia - CXR earlier in the week with progressive cardiogenic failure. BNP elevated. Received IV lasix with improvement. No hypoxia overnight. [facility] has ordered echo - EF 65-70% w/ G1DD and no wall motion abnormalities  Reported hx of colon cancer AKI on CKD3 - AKI resolved DM2 - SSI HTN -  Appreciate [facility] assistance. PRN IV Hydralazine    LOS: 8 days   Nadeen Landau, MD [facility] Use [email or web address] to contact on call provider

## 2020-08-17 NOTE — Progress Notes (Signed)
PROGRESS NOTE    [name] [name] [place]  PZW:[id number] DOB: Feb 05, 1946 DOA: 08/08/2020 PCP: Pcp, No   Chief Complain: Abdominal pain  Brief Narrative: Patient is a 75 year old female with history of diabetes type 2, hypertension, recently relocated from [place] who presented to the emergency department with complaints of abdominal pain.  CT abdomen on presentation showed gas in the upper abdomen with some inflammation at the pylorus/duodenal region suspicious for perforated ulcer.  Patient was admitted under general surgery service and she underwent omental Graham's patch on 08/09/2020.  She started having bowel movements, NG tube removed.   Liver biopsy confirmed hepatocellular carcinoma.  Oncology consulted.  On soft diet.  Discharge planning as per surgery.  Assessment & Plan:   Principal Problem:   Perforated gastric ulcer s/p lap omental Phillip Heal patch 08/09/2020 Active Problems:   Pneumoperitoneum   History of colorectal cancer   Liver mass, left lobe   Insulin-requiring or dependent type II diabetes mellitus (HCC)   HTN (hypertension)   Constipation, chronic   Hyperglycemia   Hepatocellular carcinoma ([place])   Perforated gastric ulcer: Presented with abdominal pain.  Found to have perforated gastric ulcer.  General surgery did laparoscopic omental Graham's patch on 07/2470.  She was on IV PPI, IV antibiotics( zosyn), IV hydration.   Antibiotics discontinued ,NG tube removed upper GI series with contrast negative for leak.  Currently on soft diet General surgery recommending outpatient GI follow-up for endoscopy in 6-8 weeks H. pylori  positive.  Started on triple therapy.  Liver mass/history of colon cancer:  Status post core biopsy of the liver mass.  She has history of remote colon cancer, currently in remission.  Liver biopsy confirmed hepatocellular carcinoma. Elevated  alpha-fetoprotein, MRI showed multiple liver lesions.  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  GI recommended outpatient follow-up for treatment.  Pulmonary edema: Chest x-ray showed bilateral perihilar interstitial pulmonary infiltrates/small bilateral pleural effusion.  Given 2 doses of Lasix IV 40 mg once.    Elevated BNP.   She does not appear volume overloaded .  She does not have peripheral edema.  Currently on room air.  Echo showed ejection fraction of 42-35%, grade 1 diastolic dysfunction  Normocytic anemia/acute blood loss anemia: Most likely secondary to acute blood loss from surgery.  Last Hemoglobin in the range of 8 . We recommend blood transfusion if hemoglobin drops less than 7.  Hyponatremia/hypokalemia/hypomagnesemia: Continue to monitor and supplement  AKI on CKD stage II: AKI resolved with iv fluids  Diabetes type 2: Hemoglobin A1c of 5.8.  On sliding scale insulin  Hypertension: On nifedipine, Cozaar/hydrochlorothiazide at home.  Oral medications on hold.            DVT prophylaxis:SCD Code Status: Full   Procedures:As above  Antimicrobials:  Anti-infectives (From admission, onward)    Start     Dose/Rate Route Frequency Ordered Stop   08/13/20 2200  clarithromycin (BIAXIN) tablet 500 mg        500 mg Oral Every 12 hours 08/13/20 1627 08/27/20 2159   08/13/20 2200  amoxicillin (AMOXIL) capsule 1,000 mg        1,000 mg Oral Every 12 hours 08/13/20 1627 08/27/20 2159   08/09/20 1000  piperacillin-tazobactam (ZOSYN) IVPB 3.375 g  Status:  Discontinued        3.375 g 12.5 mL/hr over 240 Minutes Intravenous Every 8 hours 08/09/20 0407 08/13/20 1627   08/09/20  0600  cefoTEtan (CEFOTAN) 2 g in sodium chloride 0.9 % 100 mL IVPB        2 g 200 mL/hr over 30 Minutes Intravenous On call to O.R. 08/09/20 0418 08/09/20 0659   08/09/20 0300  piperacillin-tazobactam (ZOSYN) IVPB 3.375 g        3.375 g 100 mL/hr over 30 Minutes Intravenous  Once 08/09/20 0253  08/09/20 0410       Subjective:  Patient seen and examined at the bedside today.  No new complaints.  Denies any abdominal pain.  Last bowel movement was yesterday with 2, small volume.  No nausea or vomiting.  Daughter at the bedside  Objective: Vitals:   08/16/20 1343 08/16/20 2055 08/17/20 0032 08/17/20 0407  BP:  (!) 161/71 133/64 129/65  Pulse:  96 100 90  Resp:  18 17 16   Temp: 100.1 F (37.8 C) 100 F (37.8 C) 98.8 F (37.1 C) 99 F (37.2 C)  TempSrc: Oral Oral Oral Oral  SpO2:  91% 97% 91%  Weight:      Height:        Intake/Output Summary (Last 24 hours) at 08/17/2020 1310 Last data filed at 08/17/2020 1100 Gross per 24 hour  Intake 2028.88 ml  Output --  Net 2028.88 ml   Filed Weights   08/09/20 1500  Weight: 65 kg    Examination: General exam: Overall comfortable, not in distress HEENT: PERRL Respiratory system:  no wheezes or crackles  Cardiovascular system: S1 & S2 heard, RRR.  Gastrointestinal system: Abdomen is moderately distended, soft and nontender.  Old surgical scar midline Central nervous system: Alert and oriented Extremities: No edema, no clubbing ,no cyanosis Skin: No rashes, no ulcers,no icterus         Data Reviewed: I have personally reviewed following labs and imaging studies  CBC: Recent Labs  Lab 08/12/20 0407 08/13/20 0433 08/14/20 0428 08/15/20 0345 08/16/20 0434  WBC 7.8 9.2 9.8 9.9 9.8  NEUTROABS  --   --  6.4  --   --   HGB 7.7* 8.6* 8.1* 8.5* 8.6*  HCT 23.2* 26.1* 24.3* 26.5* 26.2*  MCV 92.4 93.5 92.0 94.0 93.6  PLT 160 217 227 244 478   Basic Metabolic Panel: Recent Labs  Lab 08/11/20 0419 08/12/20 0407 08/13/20 0433 08/14/20 0428 08/15/20 0345 08/16/20 0434  NA 139 142 141 140 138 135  K 3.4* 3.7 3.2* 3.2* 3.3* 3.5  CL 107 111 109 107 108 105  CO2 25 25 22 26 24 23   GLUCOSE 155* 120* 142* 124* 185* 218*  BUN 23 16 14 16 13 10   CREATININE 0.79 0.65 0.73 0.57 0.56 0.52  CALCIUM 9.0 8.9 9.3 9.1 9.1 9.1   MG 2.5*  --   --   --   --   --    GFR: Estimated Creatinine Clearance: 53.3 mL/min (by C-G formula based on SCr of 0.52 mg/dL). Liver Function Tests: No results for input(s): AST, ALT, ALKPHOS, BILITOT, PROT, ALBUMIN in the last 168 hours.  No results for input(s): LIPASE, AMYLASE in the last 168 hours.  No results for input(s): AMMONIA in the last 168 hours. Coagulation Profile: No results for input(s): INR, PROTIME in the last 168 hours. Cardiac Enzymes: No results for input(s): CKTOTAL, CKMB, CKMBINDEX, TROPONINI in the last 168 hours. BNP (last 3 results) No results for input(s): PROBNP in the last 8760 hours. HbA1C: No results for input(s): HGBA1C in the last 72 hours.  CBG: Recent Labs  Lab  08/16/20 2029 08/17/20 0027 08/17/20 0404 08/17/20 0719 08/17/20 1156  GLUCAP 188* 310* 223* 145* 177*   Lipid Profile: No results for input(s): CHOL, HDL, LDLCALC, TRIG, CHOLHDL, LDLDIRECT in the last 72 hours. Thyroid Function Tests: No results for input(s): TSH, T4TOTAL, FREET4, T3FREE, THYROIDAB in the last 72 hours. Anemia Panel: No results for input(s): VITAMINB12, FOLATE, FERRITIN, TIBC, IRON, RETICCTPCT in the last 72 hours. Sepsis Labs: No results for input(s): PROCALCITON, LATICACIDVEN in the last 168 hours.  Recent Results (from the past 240 hour(s))  Resp Panel by RT-PCR (Flu A&B, Covid) Nasopharyngeal Swab     Status: None   Collection Time: 08/08/20 11:35 PM   Specimen: Nasopharyngeal Swab; Nasopharyngeal(NP) swabs in vial transport medium  Result Value Ref Range Status   SARS Coronavirus 2 by RT PCR NEGATIVE NEGATIVE Final    Comment: (NOTE) SARS-CoV-2 target nucleic acids are NOT DETECTED.  The SARS-CoV-2 RNA is generally detectable in upper respiratory specimens during the acute phase of infection. The lowest concentration of SARS-CoV-2 viral copies this assay can detect is 138 copies/mL. A negative result does not preclude SARS-Cov-2 infection and  should not be used as the sole basis for treatment or other patient management decisions. A negative result may occur with  improper specimen collection/handling, submission of specimen other than nasopharyngeal swab, presence of viral mutation(s) within the areas targeted by this assay, and inadequate number of viral copies(<138 copies/mL). A negative result must be combined with clinical observations, patient history, and epidemiological information. The expected result is Negative.  Fact Sheet for Patients:  EntrepreneurPulse.com.au  Fact Sheet for Healthcare Providers:  IncredibleEmployment.be  This test is no t yet approved or cleared by the [place] FDA and  has been authorized for detection and/or diagnosis of SARS-CoV-2 by FDA under an Emergency Use Authorization (EUA). This EUA will remain  in effect (meaning this test can be used) for the duration of the COVID-19 declaration under Section 564(b)(1) of the Act, 21 U.S.C.section 360bbb-3(b)(1), unless the authorization is terminated  or revoked sooner.       Influenza A by PCR NEGATIVE NEGATIVE Final   Influenza B by PCR NEGATIVE NEGATIVE Final    Comment: (NOTE) The Xpert Xpress SARS-CoV-2/FLU/RSV plus assay is intended as an aid in the diagnosis of influenza from Nasopharyngeal swab specimens and should not be used as a sole basis for treatment. Nasal washings and aspirates are unacceptable for Xpert Xpress SARS-CoV-2/FLU/RSV testing.  Fact Sheet for Patients: EntrepreneurPulse.com.au  Fact Sheet for Healthcare Providers: IncredibleEmployment.be  This test is not yet approved or cleared by the [place] FDA and has been authorized for detection and/or diagnosis of SARS-CoV-2 by FDA under an Emergency Use Authorization (EUA). This EUA will remain in effect (meaning this test can be used) for the duration of the COVID-19 declaration  under Section 564(b)(1) of the Act, 21 U.S.C. section 360bbb-3(b)(1), unless the authorization is terminated or revoked.  Performed at [facility], [place] [address], [place], [place] [address]          Radiology Studies: MR ABDOMEN W WO CONTRAST  Result Date: 08/15/2020 CLINICAL DATA:  Recent gastric perforation, treated with Sander Nephew. Hepatocellular carcinoma, biopsy at time of perforation repair. EXAM: MRI ABDOMEN WITHOUT AND WITH CONTRAST TECHNIQUE: Multiplanar multisequence MR imaging of the abdomen was performed both before and after the administration of intravenous contrast. CONTRAST:  6.24mL GADAVIST GADOBUTROL 1 MMOL/ML IV SOLN COMPARISON:  Abdominal CT 08/09/2020 FINDINGS: Lower chest: Subcutaneous edema in  the chest and abdomen. Small bilateral pleural effusions with passive atelectasis. 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 Some of these lesions have high precontrast T1 signal characteristics internally indicating blood products. The lesions demonstrate arterial phase enhancement along with washout and are biopsy proven to be hepatocellular carcinoma. While most of these lesions are observed in the left hepatic lobe, on image 50 of series 16 there appears to be a 1.5 by 1.1 cm rim enhancing lesion in segment 5 of the liver adjacent to the gallbladder, although admittedly this is right along the liver margin. Accounting for the motion artifact, I do not see well-defined tumor thrombus in the portal venous system or hepatic venous system. A gallstone is noted in the gallbladder. Pancreas:  Unremarkable Spleen:  Unremarkable Adrenals/Urinary Tract:  Unremarkable Stomach/Bowel: Indistinct margins of the stomach and proximal duodenum in the  vicinity of the recent repair. Surrounding edema and a small amount of ascites in the upper abdomen noted. No new free air is identified. Vascular/Lymphatic:  Unremarkable Other: Mesenteric and omental edema in the upper abdomen along with mild upper abdominal ascites. Diffuse subcutaneous edema. Musculoskeletal: Unremarkable IMPRESSION: 1. Numerous masses in the left hepatic lobe measuring up to 7.2 cm in diameter compatible with multifocal hepatocellular carcinoma. There is also probably a lesion in segment 5 of the liver representing the only identified right hepatic lobe involvement. 2. Third spacing of fluid with subcutaneous, mesenteric, and omental edema along with upper abdominal ascites. Small bilateral pleural effusions. 3. Cholelithiasis. Electronically Signed   By: Van Clines M.D.   On: 08/15/2020 21:35        Scheduled Meds:  sodium chloride   Intravenous Once   amoxicillin  1,000 mg Oral Q12H   bisacodyl  10 mg Rectal Daily   Chlorhexidine Gluconate Cloth  6 each Topical Daily   clarithromycin  500 mg Oral Q12H   enoxaparin (LOVENOX) injection  40 mg Subcutaneous Q24H   feeding supplement  1 Container Oral TID BM   insulin aspart  0-15 Units Subcutaneous Q4H   lip balm  1 application Topical BID   pantoprazole  40 mg Intravenous Q12H   pneumococcal 23 valent vaccine  0.5 mL Intramuscular Tomorrow-1000   Continuous Infusions:  sodium chloride 10 mL/hr at 08/10/20 1800   lactated ringers 50 mL/hr at 08/17/20 1251   methocarbamol (ROBAXIN) IV     ondansetron (ZOFRAN) IV       LOS: 8 days    Time spent: 25 mins.More than 50% of that time was spent in counseling and/or coordination of care.      Shelly Coss, MD [facility] P7/04/2020, 1:10 PM

## 2020-08-18 LAB — GLUCOSE, CAPILLARY
Glucose-Capillary: 155 mg/dL — ABNORMAL HIGH (ref 70–99)
Glucose-Capillary: 138 mg/dL — ABNORMAL HIGH (ref 70–99)
Glucose-Capillary: 179 mg/dL — ABNORMAL HIGH (ref 70–99)

## 2020-08-18 MED ORDER — ACETAMINOPHEN 325 MG PO TABS: 650.0000 mg | ORAL_TABLET | Freq: Four times a day (QID) | ORAL | 0 refills | Status: DC | PRN

## 2020-08-18 MED ORDER — AMOXICILLIN 500 MG PO CAPS: 1000.0000 mg | ORAL_CAPSULE | Freq: Two times a day (BID) | ORAL | 0 refills | Status: AC

## 2020-08-18 MED ORDER — OXYCODONE HCL 5 MG PO TABS: 5.0000 mg | ORAL_TABLET | ORAL | 0 refills | Status: DC | PRN

## 2020-08-18 MED ORDER — CLARITHROMYCIN 500 MG PO TABS: 500.0000 mg | ORAL_TABLET | Freq: Two times a day (BID) | ORAL | 0 refills | Status: DC

## 2020-08-18 MED ORDER — AMOXICILLIN 500 MG PO CAPS: 1000.0000 mg | ORAL_CAPSULE | Freq: Two times a day (BID) | ORAL | 0 refills | Status: DC

## 2020-08-18 MED ORDER — PANTOPRAZOLE SODIUM 40 MG PO TBEC: 40.0000 mg | DELAYED_RELEASE_TABLET | Freq: Every day | ORAL | 2 refills | Status: DC

## 2020-08-18 MED ORDER — OXYCODONE HCL 5 MG PO TABS: 5.0000 mg | ORAL_TABLET | Freq: Four times a day (QID) | ORAL | 0 refills | Status: DC | PRN

## 2020-08-18 MED ORDER — ONDANSETRON 4 MG PO TBDP: 4.0000 mg | ORAL_TABLET | Freq: Three times a day (TID) | ORAL | 0 refills | Status: DC | PRN

## 2020-08-18 MED ORDER — CLARITHROMYCIN 500 MG PO TABS: 500.0000 mg | ORAL_TABLET | Freq: Two times a day (BID) | ORAL | 0 refills | Status: AC

## 2020-08-18 NOTE — Progress Notes (Signed)
Physical Therapy Treatment Patient Details Name: [name] [name] MRN: [id number] DOB: September 02, 1945 Today's Date: 08/18/2020    History of Present Illness patient is a 42 female who recently traveld from [place] to visit daughter. patient presented to the hospital with abdominal pain. CT revealed inflammation at pylorus/duodenal region suspicion for perforated ulcer and noted to have mass on left lateral sector of the liver. on 6/25 patient underwent laparoscopic repair of perforated ulcer and lysis of adhesions. liver biopsy path with Hepatocellular carcinoma. PMH is significant for glaucoma, HTN, and DM.    PT Comments    Daughter reports plan for pt to d/c home today and would like pt to practice stairs since she has 3 flights to get into daughter's home.  Pt and daughter educated on safe stair technique, and pt encouraged to use RW at home for safety.  Provided gait belt for daughter.    Follow Up Recommendations  Home health PT     Equipment Recommendations  Rolling walker with 5" wheels    Recommendations for Other Services       Precautions / Restrictions Precautions Precautions: Fall Precaution Comments: abdominal insicions    Mobility  Bed Mobility               General bed mobility comments: pt in recliner on arrival    Transfers Overall transfer level: Needs assistance Equipment used: None Transfers: Sit to/from Stand Sit to Stand: Supervision;Min guard         General transfer comment: verbal cues for hand placement  Ambulation/Gait Ambulation/Gait assistance: Min guard Gait Distance (Feet): 100 Feet (x2) Assistive device: None;Rolling walker (2 wheeled) Gait Pattern/deviations: Step-through pattern;Decreased stride length     General Gait Details: pt ambulated 100 ft to stairwell with no assistive device; pt mildly unsteady and educated to use RW upon d/c home for safety so provided RW for ambulating back to room   Stairs Stairs:  Yes Stairs assistance: Min guard Stair Management: Step to pattern;Alternating pattern;Forwards;One rail Left Number of Stairs: 10 General stair comments: verbal cues for sequence; daughter observed and educated on holding gait belt and positioning to assist pt (pt has 3 flights to enter daughter's home)   Wheelchair Mobility    Modified Rankin (Stroke Patients Only)       Balance                                            Cognition Arousal/Alertness: Awake/alert Behavior During Therapy: WFL for tasks assessed/performed Overall Cognitive Status: Within Functional Limits for tasks assessed                                        Exercises      General Comments        Pertinent Vitals/Pain Pain Assessment: No/denies pain    Home Living                      Prior Function            PT Goals (current goals can now be found in the care plan section) Progress towards PT goals: Progressing toward goals    Frequency    Min 3X/week      PT Plan Current plan remains appropriate    Co-evaluation  AM-PAC PT "6 Clicks" Mobility   Outcome Measure  Help needed turning from your back to your side while in a flat bed without using bedrails?: A Little Help needed moving from lying on your back to sitting on the side of a flat bed without using bedrails?: A Little Help needed moving to and from a bed to a chair (including a wheelchair)?: A Little Help needed standing up from a chair using your arms (e.g., wheelchair or bedside chair)?: A Little Help needed to walk in hospital room?: A Little Help needed climbing 3-5 steps with a railing? : A Little 6 Click Score: 18    End of Session Equipment Utilized During Treatment: Gait belt Activity Tolerance: Patient tolerated treatment well Patient left: in chair;with call bell/phone within reach;with family/visitor present Nurse Communication: Mobility status PT  Visit Diagnosis: Difficulty in walking, not elsewhere classified (R26.2)     Time: 0973-5329 PT Time Calculation (min) (ACUTE ONLY): 14 min  Charges:  $Gait Training: 8-22 mins                    Arlyce Dice, DPT Acute Rehabilitation Services Pager: [phone] Office: [phone]    York Ram E 08/18/2020, 3:28 PM

## 2020-08-18 NOTE — Progress Notes (Signed)
9 Days Post-Op  Subjective: CC: Feels well this morning. No complaints. Denies n/v.  Tolerating a soft diet, but did have some discomfort after eating broccoli  Objective: Vital signs in last 24 hours: Temp:  [98.3 F (36.8 C)-99.8 F (37.7 C)] 98.3 F (36.8 C) (07/04 0321) Pulse Rate:  [90-105] 90 (07/04 0321) Resp:  [16-21] 16 (07/04 0321) BP: (145-157)/(63-83) 150/83 (07/04 0321) SpO2:  [93 %-99 %] 97 % (07/04 0321) Last BM Date: 08/17/20  Intake/Output from previous day: 07/03 0701 - 07/04 0700 In: 2219.6 [P.O.:1020; I.V.:1199.6] Out: -  Intake/Output this shift: No intake/output data recorded.  PE: Gen:  Alert, NAD, pleasant Card: RRR Pulm:  CTAB, no W/R/R, effort normal. On RA.  Abd: Soft, mildly distended, mildly tender in the upper abdomen just to the right of midline. Laparoscopic incisions c/d/I. + bowel sounds. Noted prior midline wound that is well healed. Ext:  No LE edema or calf tenderness Psych: A&Ox3 Skin: no rashes noted, warm and dry  Lab Results:  Recent Labs    08/16/20 0434  WBC 9.8  HGB 8.6*  HCT 26.2*  PLT 273    BMET Recent Labs    08/16/20 0434  NA 135  K 3.5  CL 105  CO2 23  GLUCOSE 218*  BUN 10  CREATININE 0.52  CALCIUM 9.1    PT/INR No results for input(s): LABPROT, INR in the last 72 hours. CMP     Component Value Date/Time   NA 135 08/16/2020 0434   K 3.5 08/16/2020 0434   CL 105 08/16/2020 0434   CO2 23 08/16/2020 0434   GLUCOSE 218 (H) 08/16/2020 0434   BUN 10 08/16/2020 0434   CREATININE 0.52 08/16/2020 0434   CALCIUM 9.1 08/16/2020 0434   PROT 5.3 (L) 08/10/2020 0413   ALBUMIN 2.6 (L) 08/10/2020 0413   ALBUMIN 2.6 (L) 08/10/2020 0413   AST 90 (H) 08/10/2020 0413   ALT 87 (H) 08/10/2020 0413   ALKPHOS 70 08/10/2020 0413   BILITOT 0.6 08/10/2020 0413   GFRNONAA >60 08/16/2020 0434   Lipase     Component Value Date/Time   LIPASE 46 08/08/2020 2224       Studies/Results: No results  found.  Anti-infectives: Anti-infectives (From admission, onward)    Start     Dose/Rate Route Frequency Ordered Stop   08/13/20 2200  clarithromycin (BIAXIN) tablet 500 mg        500 mg Oral Every 12 hours 08/13/20 1627 08/27/20 2159   08/13/20 2200  amoxicillin (AMOXIL) capsule 1,000 mg        1,000 mg Oral Every 12 hours 08/13/20 1627 08/27/20 2159   08/09/20 1000  piperacillin-tazobactam (ZOSYN) IVPB 3.375 g  Status:  Discontinued        3.375 g 12.5 mL/hr over 240 Minutes Intravenous Every 8 hours 08/09/20 0407 08/13/20 1627   08/09/20 0600  cefoTEtan (CEFOTAN) 2 g in sodium chloride 0.9 % 100 mL IVPB        2 g 200 mL/hr over 30 Minutes Intravenous On call to O.R. 08/09/20 0418 08/09/20 0659   08/09/20 0300  piperacillin-tazobactam (ZOSYN) IVPB 3.375 g        3.375 g 100 mL/hr over 30 Minutes Intravenous  Once 08/09/20 0253 08/09/20 0410        Assessment/Plan POD 9 s/p diagnostic laparoscopy, LOA, omental Phillip Heal patch of perforated ulcer, core liver biopsy for perforated stomach ulcer, liver masses and history of colon cancer-Dr. Gross-08/09/20 - UGI  negative for leak. Soft diet as tolerated - H. Pylori positive - per GI 14 days with clarithromycin 500 mg PO BID and amoxicillin 1000 mg PO BID. PPI BID for a minimum of 2 months - Mobilize. [facility] PT  - Pulm toilet. - Will need outpatient GI f/u for endoscopy to ensure ulcer has healed in 6-8 weeks   Path with Hepatocellular carcinoma - Discussed by our service with patient and her daughter. Dr. Benay Spice of oncology has been consulted. MRI with multiple lesions    FEN - soft, IVF per TRH (currently on 50cc/hr), replace K (3.3) VTE - SCDs, lvx ID - Zosyn 6/25 - 6/29. Amox/Clinda 6/29 >> Foley - out Follow-Up - Dr. Johney Maine. GI. Oncology    Appreciate [facility]'s assistance Hep C Ab reactive - GI consulted. They have ordered HCV RNA. If positive recommend HCV fibrosure, HCV genotype (non-reflex), and HBsAg for screening and outpatient  follow-up. Notes hx of prior transfusion in [place] ABL anemia - hgb stable Hypoxia - CXR earlier in the week with progressive cardiogenic failure. BNP elevated. Received IV lasix with improvement. No hypoxia overnight. [facility] has ordered echo - EF 65-70% w/ G1DD and no wall motion abnormalities  Reported hx of colon cancer AKI on CKD3 - AKI resolved DM2 - SSI HTN - Appreciate [facility] assistance. PRN IV Hydralazine    Plan discharge today.   LOS: 9 days   [facility] Use [email or web address] to contact on call provider

## 2020-08-18 NOTE — Discharge Instructions (Signed)
ABDOMINAL SURGERY: POST OP INSTRUCTIONS  DIET: Stay on a soft diet for the next 2 weeks (avoid raw fruits and vegetables, greasy meals, nuts etc).   Take your usually prescribed home medications unless otherwise directed.  Monitor your blood pressure and your blood sugars-follow these things up with your primary care doctor to titrate your medications PAIN CONTROL: Pain is best controlled by a usual combination of three different methods TOGETHER: Ice/Heat Over the counter pain medication Prescription pain medication Most patients will experience some swelling and bruising around the incisions.  Ice packs or heating pads (30-60 minutes up to 6 times a day) will help. Use ice for the first few days to help decrease swelling and bruising, then switch to heat to help relax tight/sore spots and speed recovery.  Some people prefer to use ice alone, heat alone, alternating between ice & heat.  Experiment to what works for you.  Swelling and bruising can take several weeks to resolve.   It is helpful to take an over-the-counter pain medication regularly for the first few weeks.  Choose one of the following that works best for you: Naproxen (Aleve, etc)  Two 220mg  tabs twice a day Ibuprofen (Advil, etc) Three 200mg  tabs four times a day (every meal & bedtime) Acetaminophen (Tylenol, etc) 500-650mg  four times a day (every meal & bedtime) A  prescription for pain medication (such as oxycodone, hydrocodone, etc) should be given to you upon discharge.  Take your pain medication as prescribed.  If you are having problems/concerns with the prescription medicine (does not control pain, nausea, vomiting, rash, itching, etc), please call us [phone] to see if we need to switch you to a different pain medicine that will work better for you and/or control your side effect better. If you need a refill on your pain medication, please contact your pharmacy.  They will contact our office to request authorization.  Prescriptions will not be filled after 5 pm or on week-ends. Avoid getting constipated.  Between the surgery and the pain medications, it is common to experience some constipation.  Increasing fluid intake and taking a fiber supplement (such as Metamucil, Citrucel, FiberCon, MiraLax, etc) 1-2 times a day regularly will usually help prevent this problem from occurring.  A mild laxative (prune juice, Milk of Magnesia, MiraLax, etc) should be taken according to package directions if there are no bowel movements after 48 hours.   Watch out for diarrhea.  If you have many loose bowel movements, simplify your diet to bland foods & liquids for a few days.  Stop any stool softeners and decrease your fiber supplement.  Switching to mild anti-diarrheal medications (Kayopectate, Pepto Bismol) can help.  If this worsens or does not improve, please call us. Wash / shower every day.  You may shower over the incision / wound.  Avoid baths until the skin is fully healed.  Continue to shower over incision(s) after the dressing is off. You may leave the incision open to air.  Remove any wicks or ribbons in your wound.  If you have an open wound, please see wound care instructions. You may replace a dressing/Band-Aid to cover the incision for comfort if you wish. ACTIVITIES as tolerated:   You may resume regular (light) daily activities beginning the next day--such as daily self-care, walking, climbing stairs--gradually increasing activities as tolerated.  If you can walk 30 minutes without difficulty, it is safe to try more intense activity such as jogging, treadmill, bicycling, low-impact aerobics, swimming, etc. Save  the most intensive and strenuous activity for last such as sit-ups, heavy lifting, contact sports, etc  Refrain from any heavy lifting or straining until you are off narcotics for pain control.   DO NOT PUSH THROUGH PAIN.  Let pain be your guide: If it hurts to do something, don't do it.  Pain is your body  warning you to avoid that activity for another week until the pain goes down. You may drive when you are no longer taking prescription pain medication, you can comfortably wear a seatbelt, and you can safely maneuver your car and apply brakes. You may have sexual intercourse when it is comfortable.  FOLLOW UP in our office Please call [facility] at [phone] to set up an appointment to see your surgeon in the office for a follow-up appointment approximately 1-2 weeks after your surgery. Make sure that you call for this appointment the day you arrive home to insure a convenient appointment time. 10. IF YOU HAVE DISABILITY OR FAMILY LEAVE FORMS, BRING THEM TO THE OFFICE FOR PROCESSING.  DO NOT GIVE THEM TO YOUR DOCTOR.   WHEN TO CALL us [phone]: Poor pain control Reactions / problems with new medications (rash/itching, nausea, etc)  Fever over 101.5 F (38.5 C) Inability to urinate Nausea and/or vomiting Worsening swelling or bruising Continued bleeding from incision. Increased pain, redness, or drainage from the incision  The clinic staff is available to answer your questions during regular business hours (8:30am-5pm).  Please don't hesitate to call and ask to speak to one of our nurses for clinical concerns.   A surgeon from [facility] is always on call at the hospitals   If you have a medical emergency, go to the nearest emergency room or call 911.    [facility], [place], [place], [place], [place]  [id number] ? MAIN: [phone] ? TOLL FREE: [phone] ? FAX [phone] [email or web address]

## 2020-08-18 NOTE — Progress Notes (Signed)
PROGRESS NOTE    [name] [name] [place]  DDU:[id number] DOB: 23-Mar-1945 DOA: 08/08/2020 PCP: Pcp, No   Chief Complain: Abdominal pain  Brief Narrative: Patient is a 75 year old female with history of diabetes type 2, hypertension, recently relocated from [place] who presented to the emergency department with complaints of abdominal pain.  CT abdomen on presentation showed gas in the upper abdomen with some inflammation at the pylorus/duodenal region suspicious for perforated ulcer.  Patient was admitted under general surgery service and she underwent omental Graham's patch on 08/09/2020.  She started having bowel movements, NG tube removed.   Liver biopsy confirmed hepatocellular carcinoma.  Oncology consulted.  Currently she is tolerating solid diet.  General surgery planning for discharging her today  Assessment & Plan:   Principal Problem:   Perforated gastric ulcer s/p lap omental Phillip Heal patch 08/09/2020 Active Problems:   Pneumoperitoneum   History of colorectal cancer   Liver mass, left lobe   Insulin-requiring or dependent type II diabetes mellitus (HCC)   HTN (hypertension)   Constipation, chronic   Hyperglycemia   Hepatocellular carcinoma ([place])   Perforated gastric ulcer: Presented with abdominal pain.  Found to have perforated gastric ulcer.  General surgery did laparoscopic omental Graham's patch on 07/2470.  She was on IV PPI, IV antibiotics( zosyn), IV hydration.   Antibiotics discontinued ,NG tube removed upper GI series with contrast negative for leak.  Currently on soft diet General surgery recommending outpatient GI follow-up for endoscopy in 6-8 weeks H. pylori  positive.  Started on triple therapy.  Liver mass/history of colon cancer:  Status post core biopsy of the liver mass.  She has history of remote colon cancer, currently in remission.  Liver biopsy confirmed hepatocellular carcinoma. Elevated  alpha-fetoprotein, MRI showed multiple liver lesions.  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  GI recommended outpatient follow-up for treatment.  Pulmonary edema: Chest x-ray showed bilateral perihilar interstitial pulmonary infiltrates/small bilateral pleural effusion.  Given 2 doses of Lasix IV 40 mg once.    Elevated BNP.   She does not appear volume overloaded .  She does not have peripheral edema.  Currently on room air.  Echo showed ejection fraction of 23-76%, grade 1 diastolic dysfunction  Normocytic anemia/acute blood loss anemia: Most likely secondary to acute blood loss from surgery.  Last Hemoglobin in the range of 8 . We recommend blood transfusion if hemoglobin drops less than 7.  AKI on CKD stage II: AKI resolved with iv fluids  Diabetes type 2: Hemoglobin A1c of 5.8.  On sliding scale insulin  Hypertension: On nifedipine, Cozaar/hydrochlorothiazide at home.  Oral medications on hold.            DVT prophylaxis:SCD Code Status: Full   Procedures:As above  Antimicrobials:  Anti-infectives (From admission, onward)    Start     Dose/Rate Route Frequency Ordered Stop   08/18/20 0000  amoxicillin (AMOXIL) 500 MG capsule        1,000 mg Oral Every 12 hours 08/18/20 0853 08/27/20 2359   08/18/20 0000  clarithromycin (BIAXIN) 500 MG tablet        500 mg Oral Every 12 hours 08/18/20 0853 08/27/20 2359   08/13/20 2200  clarithromycin (BIAXIN) tablet 500 mg        500 mg Oral Every 12 hours 08/13/20 1627 08/27/20 2159   08/13/20 2200  amoxicillin (AMOXIL) capsule 1,000 mg  1,000 mg Oral Every 12 hours 08/13/20 1627 08/27/20 2159   08/09/20 1000  piperacillin-tazobactam (ZOSYN) IVPB 3.375 g  Status:  Discontinued        3.375 g 12.5 mL/hr over 240 Minutes Intravenous Every 8 hours 08/09/20 0407 08/13/20 1627   08/09/20 0600  cefoTEtan (CEFOTAN) 2 g in sodium chloride 0.9 % 100 mL IVPB        2 g 200 mL/hr over  30 Minutes Intravenous On call to O.R. 08/09/20 0418 08/09/20 0659   08/09/20 0300  piperacillin-tazobactam (ZOSYN) IVPB 3.375 g        3.375 g 100 mL/hr over 30 Minutes Intravenous  Once 08/09/20 0253 08/09/20 0410       Subjective:  Patient seen and examined at the bedside this morning.  She was having about.  She is also ambulated on the hallway.  Denies any complaints today.  She is excited to go home.  Objective: Vitals:   08/17/20 1700 08/17/20 2011 08/17/20 2343 08/18/20 0321  BP: (!) 151/75 (!) 157/69 (!) 145/63 (!) 150/83  Pulse: (!) 105 100 90 90  Resp: 19 17 16 16   Temp: 99.6 F (37.6 C) 99.8 F (37.7 C) 99.5 F (37.5 C) 98.3 F (36.8 C)  TempSrc: Oral Oral Oral Oral  SpO2: 94% 99% 98% 97%  Weight:      Height:        Intake/Output Summary (Last 24 hours) at 08/18/2020 1137 Last data filed at 08/18/2020 1045 Gross per 24 hour  Intake 1849.95 ml  Output --  Net 1849.95 ml   Filed Weights   08/09/20 1500  Weight: 65 kg    Examination: General exam: Overall comfortable, not in distress HEENT: PERRL Respiratory system:  no wheezes or crackles  Cardiovascular system: S1 & S2 heard, RRR.  Gastrointestinal system: Abdomen is nondistended, soft and nontender. Central nervous system: Alert and oriented Extremities: No edema, no clubbing ,no cyanosis Skin: No rashes, no ulcers,no icterus       Data Reviewed: I have personally reviewed following labs and imaging studies  CBC: Recent Labs  Lab 08/12/20 0407 08/13/20 0433 08/14/20 0428 08/15/20 0345 08/16/20 0434  WBC 7.8 9.2 9.8 9.9 9.8  NEUTROABS  --   --  6.4  --   --   HGB 7.7* 8.6* 8.1* 8.5* 8.6*  HCT 23.2* 26.1* 24.3* 26.5* 26.2*  MCV 92.4 93.5 92.0 94.0 93.6  PLT 160 217 227 244 169   Basic Metabolic Panel: Recent Labs  Lab 08/12/20 0407 08/13/20 0433 08/14/20 0428 08/15/20 0345 08/16/20 0434  NA 142 141 140 138 135  K 3.7 3.2* 3.2* 3.3* 3.5  CL 111 109 107 108 105  CO2 25 22 26 24 23    GLUCOSE 120* 142* 124* 185* 218*  BUN 16 14 16 13 10   CREATININE 0.65 0.73 0.57 0.56 0.52  CALCIUM 8.9 9.3 9.1 9.1 9.1   GFR: Estimated Creatinine Clearance: 53.3 mL/min (by C-G formula based on SCr of 0.52 mg/dL). Liver Function Tests: No results for input(s): AST, ALT, ALKPHOS, BILITOT, PROT, ALBUMIN in the last 168 hours.  No results for input(s): LIPASE, AMYLASE in the last 168 hours.  No results for input(s): AMMONIA in the last 168 hours. Coagulation Profile: No results for input(s): INR, PROTIME in the last 168 hours. Cardiac Enzymes: No results for input(s): CKTOTAL, CKMB, CKMBINDEX, TROPONINI in the last 168 hours. BNP (last 3 results) No results for input(s): PROBNP in the last 8760 hours. HbA1C: No results  for input(s): HGBA1C in the last 72 hours.  CBG: Recent Labs  Lab 08/17/20 1607 08/17/20 2007 08/17/20 2340 08/18/20 0318 08/18/20 0718  GLUCAP 184* 203* 159* 155* 138*   Lipid Profile: No results for input(s): CHOL, HDL, LDLCALC, TRIG, CHOLHDL, LDLDIRECT in the last 72 hours. Thyroid Function Tests: No results for input(s): TSH, T4TOTAL, FREET4, T3FREE, THYROIDAB in the last 72 hours. Anemia Panel: No results for input(s): VITAMINB12, FOLATE, FERRITIN, TIBC, IRON, RETICCTPCT in the last 72 hours. Sepsis Labs: No results for input(s): PROCALCITON, LATICACIDVEN in the last 168 hours.  Recent Results (from the past 240 hour(s))  Resp Panel by RT-PCR (Flu A&B, Covid) Nasopharyngeal Swab     Status: None   Collection Time: 08/08/20 11:35 PM   Specimen: Nasopharyngeal Swab; Nasopharyngeal(NP) swabs in vial transport medium  Result Value Ref Range Status   SARS Coronavirus 2 by RT PCR NEGATIVE NEGATIVE Final    Comment: (NOTE) SARS-CoV-2 target nucleic acids are NOT DETECTED.  The SARS-CoV-2 RNA is generally detectable in upper respiratory specimens during the acute phase of infection. The lowest concentration of SARS-CoV-2 viral copies this assay can  detect is 138 copies/mL. A negative result does not preclude SARS-Cov-2 infection and should not be used as the sole basis for treatment or other patient management decisions. A negative result may occur with  improper specimen collection/handling, submission of specimen other than nasopharyngeal swab, presence of viral mutation(s) within the areas targeted by this assay, and inadequate number of viral copies(<138 copies/mL). A negative result must be combined with clinical observations, patient history, and epidemiological information. The expected result is Negative.  Fact Sheet for Patients:  EntrepreneurPulse.com.au  Fact Sheet for Healthcare Providers:  IncredibleEmployment.be  This test is no t yet approved or cleared by the [place] FDA and  has been authorized for detection and/or diagnosis of SARS-CoV-2 by FDA under an Emergency Use Authorization (EUA). This EUA will remain  in effect (meaning this test can be used) for the duration of the COVID-19 declaration under Section 564(b)(1) of the Act, 21 U.S.C.section 360bbb-3(b)(1), unless the authorization is terminated  or revoked sooner.       Influenza A by PCR NEGATIVE NEGATIVE Final   Influenza B by PCR NEGATIVE NEGATIVE Final    Comment: (NOTE) The Xpert Xpress SARS-CoV-2/FLU/RSV plus assay is intended as an aid in the diagnosis of influenza from Nasopharyngeal swab specimens and should not be used as a sole basis for treatment. Nasal washings and aspirates are unacceptable for Xpert Xpress SARS-CoV-2/FLU/RSV testing.  Fact Sheet for Patients: EntrepreneurPulse.com.au  Fact Sheet for Healthcare Providers: IncredibleEmployment.be  This test is not yet approved or cleared by the [place] FDA and has been authorized for detection and/or diagnosis of SARS-CoV-2 by FDA under an Emergency Use Authorization (EUA). This EUA will remain in  effect (meaning this test can be used) for the duration of the COVID-19 declaration under Section 564(b)(1) of the Act, 21 U.S.C. section 360bbb-3(b)(1), unless the authorization is terminated or revoked.  Performed at [facility], [place] [address], [place], [place] [address]          Radiology Studies: No results found.      Scheduled Meds:  sodium chloride   Intravenous Once   amoxicillin  1,000 mg Oral Q12H   bisacodyl  10 mg Rectal Daily   Chlorhexidine Gluconate Cloth  6 each Topical Daily   clarithromycin  500 mg Oral Q12H   enoxaparin (LOVENOX) injection  40 mg  Subcutaneous Q24H   feeding supplement  1 Container Oral TID BM   insulin aspart  0-15 Units Subcutaneous Q4H   lip balm  1 application Topical BID   pantoprazole  40 mg Intravenous Q12H   Continuous Infusions:  sodium chloride 10 mL/hr at 08/10/20 1800   lactated ringers 50 mL/hr at 08/17/20 1506   methocarbamol (ROBAXIN) IV     ondansetron (ZOFRAN) IV       LOS: 9 days    Time spent: 25 mins.More than 50% of that time was spent in counseling and/or coordination of care.      Shelly Coss, MD [facility] P7/05/2020, 11:37 AM

## 2020-08-18 NOTE — Progress Notes (Signed)
Inpatient Diabetes Program Recommendations  AACE/ADA: New Consensus Statement on Inpatient Glycemic Control (2015)  Target Ranges:  Prepandial:   less than 140 mg/dL      Peak postprandial:   less than 180 mg/dL (1-2 hours)      Critically ill patients:  140 - 180 mg/dL   Lab Results  Component Value Date   GLUCAP 179 (H) 08/18/2020   HGBA1C 5.8 (H) 08/09/2020    Review of Glycemic Control  HgbA1C - 5.8%  Diabetes Coordinator paged to consult with pt and daughter regarding previous insulin (from [place]) pt was on prior to admission. MD has ordered pt to continue insulin she was taking (long-acting insulin) 20 units BID. Pt also monitors blood sugars 2x/day. Will f/u with PCP.   Thank you. Lorenda Peck, RD, LDN, CDE Inpatient Diabetes Coordinator [phone]

## 2020-08-18 NOTE — Progress Notes (Signed)
[facility] Match program designed to assist pt without insurance, pt will receive letter to take to her pharmacy. Patient currently without insurance and qualifies. Pt can utilize once per year with a $3 copay. TOC CSW will discuss with patient [facility] Match program. Jonnie Finner RN CCM, [place] ED TOC CM [phone]

## 2020-08-19 NOTE — Progress Notes (Signed)
08/18/2020 1220 pm TOC CM received call from daughter that Match was not working. She was able to send a copy and ID was not correct. TOC CM sent new letter with correct ID to [facility] pharmacy. States she has paid for some of the meds out of pocket. Explained she can discuss with pharmacist about getting refund and have meds go through the [facility] program except narcotic. [place], [place] ED TOC CM [phone]

## 2020-08-19 NOTE — Discharge Summary (Signed)
Physician Discharge Summary  Patient ID: [name] [name] MRN: [id number] DOB/AGE: August 15, 1945 75 y.o.  Admit date: 08/08/2020 Discharge date: 08/19/2020  Admission Diagnoses: Perforated viscus  Discharge Diagnoses:  Principal Problem:   Perforated gastric ulcer s/p lap omental Phillip Heal patch 08/09/2020 Active Problems:   Pneumoperitoneum   History of colorectal cancer   Liver mass, left lobe   Insulin-requiring or dependent type II diabetes mellitus (HCC)   HTN (hypertension)   Constipation, chronic   Hyperglycemia   Hepatocellular carcinoma ([place])   Discharged Condition: good  Hospital Course: She underwent laparoscopic washout and Graham patch for perforated ulcer.  Noted to have liver masses intraoperatively which were biopsied and confirmed to be hepatocellular carcinoma.  She was also found to be positive for HCV.  Her postoperative course was uneventful.  On the day of discharge she is tolerating a soft diet, pain well controlled, denies any nausea, having bowel function and mobilizing well.  Consults: Oncology, GI, hospitalist  Treatments: Surgery as above  Discharge Exam: Blood pressure (!) 150/83, pulse 90, temperature 98.3 F (36.8 C), temperature source Oral, resp. rate 16, height 5\' 4"  (1.626 m), weight 65 kg, SpO2 97 %. See rounding note  Disposition: Discharge disposition: 01-Home or Self Care        Allergies as of 08/18/2020       Reactions   Bactrim [sulfamethoxazole-trimethoprim] Swelling        Medication List     TAKE these medications    acetaminophen 325 MG tablet Commonly known as: TYLENOL Take 2 tablets (650 mg total) by mouth every 6 (six) hours as needed for mild pain, moderate pain or fever.   amoxicillin 500 MG capsule Commonly known as: AMOXIL Take 2 capsules (1,000 mg total) by mouth every 12 (twelve) hours for 9 days.   brimonidine 0.1 % Soln Commonly known as: ALPHAGAN P Place 1 drop into both eyes in the morning and  at bedtime.   clarithromycin 500 MG tablet Commonly known as: BIAXIN Take 1 tablet (500 mg total) by mouth every 12 (twelve) hours for 9 days.   diphenhydramine-acetaminophen 25-500 MG Tabs tablet Commonly known as: TYLENOL PM Take 1 tablet by mouth at bedtime as needed (sleep).   hydrochlorothiazide 25 MG tablet Commonly known as: HYDRODIURIL Take 25 mg by mouth daily.   losartan 25 MG tablet Commonly known as: COZAAR Take 50 mg by mouth at bedtime.   NIFEdipine 30 MG 24 hr tablet Commonly known as: ADALAT CC Take 30 mg by mouth in the morning and at bedtime.   ondansetron 4 MG disintegrating tablet Commonly known as: Zofran ODT Take 1 tablet (4 mg total) by mouth every 8 (eight) hours as needed for nausea or vomiting.   oxyCODONE 5 MG immediate release tablet Commonly known as: Oxy IR/ROXICODONE Take 1-2 tablets (5-10 mg total) by mouth every 6 (six) hours as needed for moderate pain or severe pain.   pantoprazole 40 MG tablet Commonly known as: Protonix Take 1 tablet (40 mg total) by mouth daily.   polyethylene glycol 17 g packet Commonly known as: MIRALAX / GLYCOLAX Take 17 g by mouth daily as needed for moderate constipation.   PRESCRIPTION MEDICATION Inject 20 Units as directed in the morning and at bedtime.        Follow-up Information     Health, [facility] Department Of Public Follow up.   Why: [phone], call to determine Medicaid eligibility Contact information: [address] [place] [address] [phone]  [facility] Follow up on 09/10/2020.   Why: Your hospital follow-up appointment is scheduled for Wednesday September 10, 2020 at 9:30am. Contact information: [place] [address] [phone]        Otis Brace, MD. Go on 09/16/2020.   Specialty: Gastroenterology Why: Appointment scheduled with Dr. Alessandra Bevels at [facility] on  August 2nd at 9:00am. Follow up regarding HCV diagnosis and follow up endoscopy after perf ulcer repair Contact information: [place] [address] [phone]         Ladell Pier, MD Follow up.   Specialty: Oncology Contact information: [place] [address] [phone]         Michael Boston, MD Follow up in 2 week(s).   Specialties: General Surgery, Colon and Rectal Surgery Contact information: [address] [place] [address] [phone]                 Signed: Clovis Riley 08/19/2020, 3:36 PM

## 2020-08-19 NOTE — Progress Notes (Signed)
  [place] Medication Assistance Card  Name: [name] [name]   ID (MRN): [id number] [place]: [id number] RX Group: [id number]  Discharge Date:08/18/2020  1:10 PM  Expiration Date:08/26/2020  (must be filled within 7 days of discharge)     Dear [name] [name]:  You have been approved to have the prescriptions written by your discharging physician filled through our [facility] (Medication Assistance Through [facility]) program. This program allows for a one-time (no refills) 34-day supply of selected medications for a low copay amount.  The copay is $3.00 per prescription. For instance, if you have one prescription, you will pay $3.00; for two prescriptions, you pay $6.00; for three prescriptions, you pay $9.00; and so on. Narcotics are not covered under Match.   Only certain pharmacies are participating in this program with [facility]. You will need to select one of the pharmacies from the attached list and take your prescriptions, this letter, and your photo ID to one of the participating pharmacies.   We are excited that you are able to use the [facility] program to get your medications. These prescriptions must be filled within 7 days of hospital discharge or they will no longer be valid for the [facility] program. Should you have any problems with your prescriptions please contact your case management team member at [phone] or [phone] for [place] you,  [place]

## 2020-08-20 ENCOUNTER — Other Ambulatory Visit: Payer: Self-pay

## 2020-08-20 ENCOUNTER — Encounter: Payer: Self-pay | Admitting: *Deleted

## 2020-08-20 NOTE — Progress Notes (Signed)
Patient presented in GI Tumor Board this morning. Suggestion for systemic chemotherapy and possible Y90. Update given to Dr Marin Olp.   Called and spoke to patient's daughter, Yehuda Budd. When I spoke to them last week, they were both in the hospital. Reviewed appointment information with her and ensured she had correct time, date and location. She has the after visit summary from her mom's discharge with the information. She also states that she has not yet had time to activate MyChart, but that she will get to it soon.   Oncology Nurse Navigator Documentation  Oncology Nurse Navigator Flowsheets 08/20/2020  Abnormal Finding Date -  Confirmed Diagnosis Date -  Diagnosis Status -  Navigator Follow Up Date: 09/01/2020  Navigator Follow Up Reason: New Patient Appointment  Navigator Location [facility]  Referral Date to RadOnc/MedOnc -  Navigator Encounter Type Telephone  Telephone Appt Confirmation/Clarification;Outgoing Call  [facility] Date 08/20/2020  Multidisiplinary Clinic Type GI  Patient Visit Type MedOnc  Treatment Phase Pre-Tx/Tx Discussion  Barriers/Navigation Needs Coordination of Care;Education  Education Other  Interventions Education;Psycho-Social Support  Acuity Level 2-Minimal Needs (1-2 Barriers Identified)  Coordination of Care -  Education Method Verbal  Support Groups/Services Friends and Family  Time Spent with Patient 30

## 2020-08-20 NOTE — Progress Notes (Signed)
The proposed treatment discussed in conference is for discussion purpose only and is not a binding recommendation.  The patients have not been physically examined, or presented with their treatment options.  Therefore, final treatment plans cannot be decided.  

## 2020-08-21 LAB — HEPATITIS C GENOTYPE

## 2020-08-21 LAB — HCV RNA QUANT RFLX ULTRA OR GENOTYP
HCV RNA Qnt(log copy/mL): 5.569 log10 IU/mL
HepC Qn: 371000 IU/mL

## 2020-08-21 LAB — HEPATITIS C GENOTYPE REFLEX

## 2020-08-26 ENCOUNTER — Encounter: Payer: Self-pay | Admitting: *Deleted

## 2020-08-26 NOTE — Progress Notes (Signed)
Received a call from patient's daughter, Blandine requesting assistance with setting up patient's MyChart.  Able to assist patient and portal was created.  Oncology Nurse Navigator Documentation  Oncology Nurse Navigator Flowsheets 08/26/2020  Abnormal Finding Date -  Confirmed Diagnosis Date -  Diagnosis Status -  Navigator Follow Up Date: 09/01/2020  Navigator Follow Up Reason: New Patient Appointment  Navigator Location [facility]  Referral Date to RadOnc/MedOnc -  Navigator Encounter Type Telephone  Telephone Incoming Call  [facility] Date -  Multidisiplinary Clinic Type -  Patient Visit Type MedOnc  Treatment Phase Pre-Tx/Tx Discussion  Barriers/Navigation Needs Coordination of Care;Education  Education Other  Interventions Education;Psycho-Social Support  Acuity Level 2-Minimal Needs (1-2 Barriers Identified)  Coordination of Care Other  Education Method Verbal  Support Groups/Services Friends and Family  Time Spent with Patient 15

## 2020-09-01 ENCOUNTER — Inpatient Hospital Stay: Payer: Self-pay | Attending: Hematology & Oncology

## 2020-09-01 ENCOUNTER — Other Ambulatory Visit: Payer: Self-pay

## 2020-09-01 ENCOUNTER — Inpatient Hospital Stay (HOSPITAL_BASED_OUTPATIENT_CLINIC_OR_DEPARTMENT_OTHER): Payer: Self-pay | Admitting: Hematology & Oncology

## 2020-09-01 ENCOUNTER — Encounter: Payer: Self-pay | Admitting: Hematology & Oncology

## 2020-09-01 VITALS — BP 140/70 | HR 74 | Temp 98.9°F | Resp 20 | Wt 144.0 lb

## 2020-09-01 DIAGNOSIS — Z79899 Other long term (current) drug therapy: Secondary | ICD-10-CM | POA: Insufficient documentation

## 2020-09-01 DIAGNOSIS — C220 Liver cell carcinoma: Secondary | ICD-10-CM | POA: Insufficient documentation

## 2020-09-01 DIAGNOSIS — Z9221 Personal history of antineoplastic chemotherapy: Secondary | ICD-10-CM | POA: Insufficient documentation

## 2020-09-01 DIAGNOSIS — I10 Essential (primary) hypertension: Secondary | ICD-10-CM | POA: Insufficient documentation

## 2020-09-01 DIAGNOSIS — Z7189 Other specified counseling: Secondary | ICD-10-CM | POA: Insufficient documentation

## 2020-09-01 DIAGNOSIS — Z85038 Personal history of other malignant neoplasm of large intestine: Secondary | ICD-10-CM | POA: Insufficient documentation

## 2020-09-01 DIAGNOSIS — E119 Type 2 diabetes mellitus without complications: Secondary | ICD-10-CM | POA: Insufficient documentation

## 2020-09-01 DIAGNOSIS — B1920 Unspecified viral hepatitis C without hepatic coma: Secondary | ICD-10-CM | POA: Insufficient documentation

## 2020-09-01 DIAGNOSIS — Z923 Personal history of irradiation: Secondary | ICD-10-CM | POA: Insufficient documentation

## 2020-09-01 DIAGNOSIS — Z5112 Encounter for antineoplastic immunotherapy: Secondary | ICD-10-CM | POA: Insufficient documentation

## 2020-09-01 HISTORY — DX: Other specified counseling: Z71.89

## 2020-09-01 LAB — CBC WITH DIFFERENTIAL (CANCER CENTER ONLY)
Abs Immature Granulocytes: 0.01 10*3/uL (ref 0.00–0.07)
Basophils Absolute: 0 10*3/uL (ref 0.0–0.1)
Basophils Relative: 0 %
Eosinophils Absolute: 0.1 10*3/uL (ref 0.0–0.5)
Eosinophils Relative: 2 %
HCT: 35.7 % — ABNORMAL LOW (ref 36.0–46.0)
Hemoglobin: 11.2 g/dL — ABNORMAL LOW (ref 12.0–15.0)
Immature Granulocytes: 0 %
Lymphocytes Relative: 31 %
Lymphs Abs: 1.4 10*3/uL (ref 0.7–4.0)
MCH: 29.6 pg (ref 26.0–34.0)
MCHC: 31.4 g/dL (ref 30.0–36.0)
MCV: 94.2 fL (ref 80.0–100.0)
Monocytes Absolute: 0.3 10*3/uL (ref 0.1–1.0)
Monocytes Relative: 7 %
Neutro Abs: 2.7 10*3/uL (ref 1.7–7.7)
Neutrophils Relative %: 60 %
Platelet Count: 361 10*3/uL (ref 150–400)
RBC: 3.79 MIL/uL — ABNORMAL LOW (ref 3.87–5.11)
RDW: 15.9 % — ABNORMAL HIGH (ref 11.5–15.5)
WBC Count: 4.6 10*3/uL (ref 4.0–10.5)
nRBC: 0 % (ref 0.0–0.2)

## 2020-09-01 LAB — CMP (CANCER CENTER ONLY)
ALT: 27 U/L (ref 0–44)
AST: 43 U/L — ABNORMAL HIGH (ref 15–41)
Albumin: 3.7 g/dL (ref 3.5–5.0)
Alkaline Phosphatase: 167 U/L — ABNORMAL HIGH (ref 38–126)
Anion gap: 6 (ref 5–15)
BUN: 13 mg/dL (ref 8–23)
CO2: 28 mmol/L (ref 22–32)
Calcium: 10.7 mg/dL — ABNORMAL HIGH (ref 8.9–10.3)
Chloride: 103 mmol/L (ref 98–111)
Creatinine: 0.61 mg/dL (ref 0.44–1.00)
GFR, Estimated: >60 mL/min (ref >60)
Glucose, Bld: 84 mg/dL (ref 70–99)
Potassium: 4.5 mmol/L (ref 3.5–5.1)
Sodium: 137 mmol/L (ref 135–145)
Total Bilirubin: 0.9 mg/dL (ref 0.3–1.2)
Total Protein: 8.4 g/dL — ABNORMAL HIGH (ref 6.5–8.1)

## 2020-09-01 LAB — LACTATE DEHYDROGENASE: LDH: 312 U/L — ABNORMAL HIGH (ref 98–192)

## 2020-09-01 LAB — PREALBUMIN: Prealbumin: 8.6 mg/dL — ABNORMAL LOW (ref 18–38)

## 2020-09-01 NOTE — Progress Notes (Signed)
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  HPI: [name] is a very charming 75 year old woman from [place].  She actually has only been in this country for 2 and half months.  She is coming over to visit her daughter.  She apparently developed a perforated gastric ulcer.  She needed to have surgery for this.  Amazingly, the time of surgery, it was found that she had liver masses.  She subsequently underwent a biopsy of one of the liver masses.  This was done on 08/09/2020.  The pathology report ([id number]) showed hepatocellular carcinoma.  As part of her work-up, she had an MRI of the liver.  This was done on 08/15/2020.  She had a large mass in the left hepatic lobe measuring 7.2 x 6.8 cm.  She had other multifocal lesions.  Most of the lesions were located in the left hepatic lobe.  She had no disease outside of the liver as far as I can tell.  Her alpha-fetoprotein was 66.  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  She is not sure how she got this.  She may have got this through a blood transfusion in [place].  She has never had any treatment for this.  She never knew that she had this.  She ultimately was discharged on 08/18/2020.  She now comes to the [place] with her daughter to be evaluated for treatment.  She feels okay.  She is recovered from her surgery.  Note, she apparently had colon cancer surgery back in 2004.  She says she had chemotherapy and radiation therapy afterwards.  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  As far she knows, there is no history of cancer in the family.  She does not have any problems with weight loss.  There is no bleeding.  She has had no change in bowel or bladder habits.  I suspect that the gastric perforation was from her  taking quite a bit of Aleve after she had a cut on her leg.  Currently, I would say her performance status is ECOG 1.    Past Medical History:  Diagnosis Date   Diabetes mellitus without complication ([place])    Glaucoma    Hypertension   :   Past Surgical History:  Procedure Laterality Date   COLON RESECTION  2005   In [place] - ?colon cancer   LAPAROSCOPIC GASTRIC RESECTION N/A 08/09/2020   Procedure: LAPAROSCOPIC EXPLORATION OF ABDOMEN, PERFORATED ULCER REPAIR, LIVER BIOPSY;  Surgeon: Michael Boston, MD;  Location: [facility];  Service: General;  Laterality: N/A;  :   Current Outpatient Medications:    acetaminophen (TYLENOL) 325 MG tablet, Take 2 tablets (650 mg total) by mouth every 6 (six) hours as needed for mild pain, moderate pain or fever., Disp: 30 tablet, Rfl: 0   brimonidine (ALPHAGAN P) 0.1 % SOLN, Place 1 drop into both eyes in the morning and at bedtime., Disp: , Rfl:    diphenhydramine-acetaminophen (TYLENOL PM) 25-500 MG TABS tablet, Take 1 tablet by mouth at bedtime as needed (sleep)., Disp: , Rfl:    hydrochlorothiazide (HYDRODIURIL) 25 MG tablet, Take 25 mg by mouth daily., Disp: , Rfl:    losartan (COZAAR) 25 MG tablet, Take 50 mg by mouth at bedtime.,  Disp: , Rfl:    NIFEdipine (ADALAT CC) 30 MG 24 hr tablet, Take 30 mg by mouth in the morning and at bedtime., Disp: , Rfl:    polyethylene glycol (MIRALAX / GLYCOLAX) 17 g packet, Take 17 g by mouth daily as needed for moderate constipation., Disp: , Rfl:    PRESCRIPTION MEDICATION, Inject 20 Units as directed in the morning and at bedtime. Rx from [place], [place]: , Rfl:    senna (SENOKOT) 8.6 MG tablet, Take 2 tablets by mouth daily., Disp: , Rfl:    ondansetron (ZOFRAN ODT) 4 MG disintegrating tablet, Take 1 tablet (4 mg total) by mouth every 8 (eight) hours as needed for nausea or vomiting. (Patient not taking: Reported on 09/01/2020), Disp: 20 tablet, Rfl: 0   oxyCODONE (OXY IR/ROXICODONE) 5 MG immediate release  tablet, Take 1-2 tablets (5-10 mg total) by mouth every 6 (six) hours as needed for moderate pain or severe pain. (Patient not taking: Reported on 09/01/2020), Disp: 20 tablet, Rfl: 0   pantoprazole (PROTONIX) 40 MG tablet, Take 1 tablet (40 mg total) by mouth daily. (Patient not taking: Reported on 09/01/2020), Disp: 90 tablet, Rfl: 2:  :   Allergies  Allergen Reactions   Amoxicillin     09/01/2020 Daughter is unsure if patient is allergic to Amoxicillin or Clarithromycin. Caused rash and swelling.   Bactrim [Sulfamethoxazole-Trimethoprim] Swelling   Clarithromycin     09/01/2020 Daughter is unsure if Clarithromycin or Amoxicillin caused rash and swelling.  :  History reviewed. No pertinent family history.:   Social History   Socioeconomic History   Marital status: Widowed    Spouse name: Not on file   Number of children: 5   Years of education: 14   Highest education level: Associate degree: occupational, Hotel manager, or vocational program  Occupational History   Not on file  Tobacco Use   Smoking status: Never   Smokeless tobacco: Never  Vaping Use   Vaping Use: Never used  Substance and Sexual Activity   Alcohol use: Not Currently   Drug use: Never   Sexual activity: Not Currently  Other Topics Concern   Not on file  Social History Narrative   Originally from [place].  Speaks [place] and [place]   Social Determinants of Radio broadcast assistant Strain: Not on [employer] Insecurity: Not on file  Transportation Needs: Not on file  Physical Activity: Not on file  Stress: Not on file  Social Connections: Not on file  Intimate Partner Violence: Not on file  : Review of Systems  Constitutional: Negative.   HENT: Negative.    Eyes: Negative.   Respiratory: Negative.    Cardiovascular: Negative.   Gastrointestinal: Negative.   Genitourinary: Negative.   Musculoskeletal: Negative.   Skin: Negative.   Neurological: Negative.   Endo/Heme/Allergies: Negative.    Psychiatric/Behavioral: Negative.       Exam:  This is a fairly well-developed and well-nourished African female in no obvious distress.  Vital signs show temperature of 98.9.  Pulse 74.  Blood pressure 140/70.  Weight is 144 pounds.  Head neck exam shows no scleral icterus.  There is no ocular or oral lesions.  She has no adenopathy in the neck.  Lungs are clear bilaterally.  Cardiac exam regular rate and rhythm with a normal S1 and S2.  There are no murmurs, rubs or bruits.  Abdomen is soft.  Abdomen is slightly distended.  There is no obvious fluid wave.  She has the laparotomy  scar from her colon cancer surgery in the midline.  There is a bit of a keloid.  She has healed laparoscopy scars from the gastric perforation surgery.  There is no obvious abdominal mass.  Her liver edge is about 4 cm below the right costal margin.  There is no splenomegaly.  Back exam shows no tenderness over the spine, ribs or hips.  Extremities shows no clubbing, cyanosis or edema.  Neurological exam shows no focal neurological deficits.  Skin exam shows no rashes, ecchymoses or petechia.   @IPVITALS @    Recent Labs    09/01/20 1041  WBC 4.6  HGB 11.2*  HCT 35.7*  PLT 361    Recent Labs    09/01/20 1041  NA 137  K 4.5  CL 103  CO2 28  GLUCOSE 84  BUN 13  CREATININE 0.61  CALCIUM 10.7*    Blood smear review: None  Pathology: See above    Assessment and Plan: [name] is a very charming 75 year old African woman from [place].  She is over in this country for a little bit.  Apparently she will go back to [place] at some point.  She was incredibly fortunate that she had this gastric perforation as this was only way that her hepatocellular carcinoma was detected.  She has Hepatitis C.  This needs to be treated.  She will follow-up with gastroenterology and I am sure they will somehow arrange for treatment for her Hepatitis C to be done.  Treating this will clearly help with her  hepatocellular carcinoma.  It looks like her disease is limited to her liver.  Unfortunately, I do not think we can use Avastin.  I just worry that she just has had this gastric perforation surgery and using Avastin will clearly increase the risk of this surgery weakening and possibly causing another perforation.  As such, I think may be using combination immunotherapy will help.  I think this would be a reasonable way to go.  She will need to have a Port-A-Cath placed.  I explained what a Port-A-Cath is.  I would consider using nivolumab/ipilimumab.  I think this would be a very reasonable option.  I went over the risk of this.  I think the bigger risk is going to be diarrhea.  We will have to watch for this.  [name] does have a strong faith.  I gave her a prayer blanket and she is very grateful for this.  I spent a good hour with she and her daughter.  Her daughter is very nice.  Again I am not sure how long  Ms. Aseghanka will be in this country.  It sound like she will be here for close to a year.  I will try to get started with treatment next week.  Again she will have to have the Port-A-Cath placed.  I will plan to see her back when she starts her second cycle of treatment.  I would use a 4 cycles of immunotherapy and then repeat her scans and see how everything looks.

## 2020-09-01 NOTE — Progress Notes (Signed)
START OFF PATHWAY REGIMEN - Other   [id number]:Ipilimumab 1 mg/kg + Nivolumab 3 mg/kg q21 Days x 4 Cycles Followed by Nivolumab 240 mg q14 Days:   A cycle is every 21 days:     Nivolumab      Ipilimumab    A cycle is every 14 days:     Nivolumab   **Always confirm dose/schedule in your pharmacy ordering system**  Patient Characteristics: Intent of Therapy: Curative Intent, Discussed with Patient

## 2020-09-02 ENCOUNTER — Telehealth: Payer: Self-pay

## 2020-09-02 LAB — IRON AND TIBC
Iron: 30 ug/dL — ABNORMAL LOW (ref 41–142)
Saturation Ratios: 11 % — ABNORMAL LOW (ref 21–57)
TIBC: 275 ug/dL (ref 236–444)
UIBC: 245 ug/dL (ref 120–384)

## 2020-09-02 LAB — CEA (IN HOUSE-CHCC): CEA (CHCC-In House): <1 ng/mL (ref 0.00–5.00)

## 2020-09-02 LAB — FERRITIN: Ferritin: 800 ng/mL — ABNORMAL HIGH (ref 11–307)

## 2020-09-02 LAB — AFP TUMOR MARKER: AFP, Serum, Tumor Marker: 70.4 ng/mL — ABNORMAL HIGH (ref 0.0–9.2)

## 2020-09-04 NOTE — Progress Notes (Signed)
Pharmacist Chemotherapy Monitoring - Initial Assessment    Anticipated start date: 08/2820   The following has been reviewed per standard work regarding the patient's treatment regimen: The patient's diagnosis, treatment plan and drug doses, and organ/hematologic function Lab orders and baseline tests specific to treatment regimen  The treatment plan start date, drug sequencing, and pre-medications Prior authorization status  Patient's documented medication list, including drug-drug interaction screen and prescriptions for anti-emetics and supportive care specific to the treatment regimen The drug concentrations, fluid compatibility, administration routes, and timing of the medications to be used The patient's access for treatment and lifetime cumulative dose history, if applicable  The patient's medication allergies and previous infusion related reactions, if applicable   Changes made to treatment plan:  N/A  Follow up needed:  Self pay, patient assistance techs checking to see if any help available.   Claybon Jabs, [place], 09/04/2020  10:02 AM

## 2020-09-09 ENCOUNTER — Encounter: Payer: Self-pay | Admitting: *Deleted

## 2020-09-09 ENCOUNTER — Other Ambulatory Visit: Payer: Self-pay | Admitting: *Deleted

## 2020-09-09 DIAGNOSIS — C220 Liver cell carcinoma: Secondary | ICD-10-CM

## 2020-09-09 MED ORDER — LIDOCAINE-PRILOCAINE 2.5-2.5 % EX CREA: TOPICAL_CREAM | CUTANEOUS | 3 refills | Status: DC

## 2020-09-09 NOTE — Progress Notes (Signed)
Patient had their new patient appointment when this navigator was out of the office. Patient starting chemo this week. She will receive her first treatment peripherally as her port cannot be placed until 09/18/20.  Patient has not yet been scheduled for chemo ed. Chemo educator will come in on Thursday prior to patient's treatment to provide education.   Called and notified patient's daughter of planned education. Told her that she could be present during this so that she also could receive education and ask questions.   Oncology Nurse Navigator Documentation  Oncology Nurse Navigator Flowsheets 09/09/2020  Abnormal Finding Date -  Confirmed Diagnosis Date -  Diagnosis Status -  Planned Course of Treatment Chemotherapy  Phase of Treatment Chemo  Navigator Follow Up Date: 09/11/2020  Navigator Follow Up Reason: Chemotherapy  Navigator Location [facility]  Referral Date to RadOnc/MedOnc -  Navigator Encounter Type Appt/Treatment Plan Review;Telephone  Telephone Outgoing Call  [facility] Date -  Multidisiplinary Clinic Type -  Patient Visit Type MedOnc  Treatment Phase Pre-Tx/Tx Discussion  Barriers/Navigation Needs Coordination of Care;Education  Education -  Interventions Coordination of Care  Acuity Level 2-Minimal Needs (1-2 Barriers Identified)  Coordination of Care Other  Education Method -  Support Groups/Services Friends and Family  Time Spent with Patient 30

## 2020-09-10 ENCOUNTER — Encounter (INDEPENDENT_AMBULATORY_CARE_PROVIDER_SITE_OTHER): Payer: Self-pay | Admitting: Primary Care

## 2020-09-10 ENCOUNTER — Other Ambulatory Visit: Payer: Self-pay

## 2020-09-10 ENCOUNTER — Encounter: Payer: Self-pay | Admitting: Hematology & Oncology

## 2020-09-10 ENCOUNTER — Ambulatory Visit (INDEPENDENT_AMBULATORY_CARE_PROVIDER_SITE_OTHER): Payer: Self-pay | Admitting: Primary Care

## 2020-09-10 VITALS — BP 136/66 | HR 90 | Temp 97.5°F | Ht 64.0 in | Wt 143.4 lb

## 2020-09-10 DIAGNOSIS — Z7689 Persons encountering health services in other specified circumstances: Secondary | ICD-10-CM

## 2020-09-10 DIAGNOSIS — I10 Essential (primary) hypertension: Secondary | ICD-10-CM

## 2020-09-10 DIAGNOSIS — E119 Type 2 diabetes mellitus without complications: Secondary | ICD-10-CM

## 2020-09-10 DIAGNOSIS — K251 Acute gastric ulcer with perforation: Secondary | ICD-10-CM

## 2020-09-10 DIAGNOSIS — Z794 Long term (current) use of insulin: Secondary | ICD-10-CM

## 2020-09-10 DIAGNOSIS — R160 Hepatomegaly, not elsewhere classified: Secondary | ICD-10-CM

## 2020-09-10 MED ORDER — NOVOLIN 70/30 (70-30) 100 UNIT/ML ~~LOC~~ SUSP
15.0000 [IU] | Freq: Two times a day (BID) | SUBCUTANEOUS | 3 refills | Status: DC
  Filled 2020-09-10: qty 10, 33d supply, fill #0
  Filled 2020-10-15: qty 10, 33d supply, fill #1
  Filled 2020-11-12: qty 10, 33d supply, fill #2
  Filled 2020-12-15: qty 10, 33d supply, fill #3

## 2020-09-10 MED ORDER — LOSARTAN POTASSIUM 25 MG PO TABS
50.0000 mg | ORAL_TABLET | Freq: Every day | ORAL | 1 refills | Status: DC
  Filled 2020-09-10: qty 60, 30d supply, fill #0
  Filled 2020-10-15: qty 60, 30d supply, fill #1
  Filled 2020-11-12: qty 60, 30d supply, fill #2

## 2020-09-10 MED ORDER — HYDROCHLOROTHIAZIDE 25 MG PO TABS
25.0000 mg | ORAL_TABLET | Freq: Every day | ORAL | 1 refills | Status: DC
  Filled 2020-09-10: qty 30, 30d supply, fill #0

## 2020-09-10 NOTE — Progress Notes (Signed)
[name] [name]   Subjective:   [name] is a 75 y.o. female presents for hospital follow up and establish care. She presented to the ED on 08/08/20  prior to she was in [place] and had the J&J vaccine 12/21 than after arriving to the states she had the booster at [facility] than she started having symptoms headache , nauseated / vomiting and abdominal pain . Admit date to the hospital was 08/08/20, patient was discharged from the hospital on 08/18/20, patient was admitted for:  Bowel Perforated . Other dx Pneumoperitoneum, History of colorectal cancer,Liver mass, left lobe, Insulin-requiring or dependent type II diabetes mellitus ([name] [name]), HTN (hypertension), Constipation, chronic,Perforated gastric ulcer s/p lap omental Graham patch 08/09/2020, and HyperglycemiaHepatocellular carcinoma ([name] [name]).  Past Medical History:  Diagnosis Date   Diabetes mellitus without complication ([name] [name])    Glaucoma    Goals of care, counseling/discussion 09/01/2020   Hypertension      Allergies  Allergen Reactions   Amoxicillin     09/01/2020 Daughter is unsure if patient is allergic to Amoxicillin or Clarithromycin. Caused rash and swelling.   Bactrim [Sulfamethoxazole-Trimethoprim] Swelling   Clarithromycin     09/01/2020 Daughter is unsure if Clarithromycin or Amoxicillin caused rash and swelling.      Current Outpatient Medications on File Prior to Visit  Medication Sig Dispense Refill   acetaminophen (TYLENOL) 325 MG tablet Take 2 tablets (650 mg total) by mouth every 6 (six) hours as needed for mild pain, moderate pain or fever. 30 tablet 0   brimonidine (ALPHAGAN P) 0.1 % SOLN Place 1 drop into both eyes in the morning and at bedtime.     diphenhydramine-acetaminophen (TYLENOL PM) 25-500 MG TABS tablet Take 1 tablet by mouth at bedtime as needed (sleep).     hydrochlorothiazide (HYDRODIURIL) 25 MG tablet Take 25 mg by mouth daily.     lidocaine-prilocaine (EMLA) cream Apply  to affected area once 30 g 3   losartan (COZAAR) 25 MG tablet Take 50 mg by mouth at bedtime.     NIFEdipine (ADALAT CC) 30 MG 24 hr tablet Take 30 mg by mouth in the morning and at bedtime.     ondansetron (ZOFRAN ODT) 4 MG disintegrating tablet Take 1 tablet (4 mg total) by mouth every 8 (eight) hours as needed for nausea or vomiting. (Patient not taking: Reported on 09/01/2020) 20 tablet 0   oxyCODONE (OXY IR/ROXICODONE) 5 MG immediate release tablet Take 1-2 tablets (5-10 mg total) by mouth every 6 (six) hours as needed for moderate pain or severe pain. (Patient not taking: Reported on 09/01/2020) 20 tablet 0   pantoprazole (PROTONIX) 40 MG tablet Take 1 tablet (40 mg total) by mouth daily. (Patient not taking: Reported on 09/01/2020) 90 tablet 2   polyethylene glycol (MIRALAX / GLYCOLAX) 17 g packet Take 17 g by mouth daily as needed for moderate constipation.     PRESCRIPTION MEDICATION Inject 20 Units as directed in the morning and at bedtime. Rx from [place]     senna (SENOKOT) 8.6 MG tablet Take 2 tablets by mouth daily.     No current facility-administered medications on file prior to visit.     Review of System: Review of Systems  Respiratory:  Positive for cough.        2-3 week   Gastrointestinal:  Positive for constipation.  All other systems reviewed and are negative.  Objective:  There were no vitals taken for this visit.  There were no vitals filed  for this visit.  Physical Exam: General Appearance: Well nourished, in no apparent distress. Eyes: PERRLA, EOMs, conjunctiva no swelling or erythema Sinuses: No Frontal/maxillary tenderness ENT/Mouth: Ext aud canals clear, TMs without erythema, bulging. Hearing normal.  Neck: Supple, thyroid normal.  Respiratory: Respiratory effort normal, BS equal bilaterally without rales, rhonchi, wheezing or stridor.  Cardio: RRR with no MRGs. Brisk peripheral pulses without edema.  Abdomen: Soft, + BS.  Non tender, no guarding,  rebound, hernias, masses. Lymphatics: Non tender without lymphadenopathy.  Musculoskeletal: Full ROM, 5/5 strength, normal gait.  Skin: Warm, dry without rashes, lesions, ecchymosis.  Neuro: Cranial nerves intact. Normal muscle tone, no cerebellar symptoms. Sensation intact.  Psych: Awake and oriented X 3, normal affect, Insight and Judgment appropriate.    Assessment:  [name] [name] was seen today for hospitalization follow-up.  Diagnoses and all orders for this visit:  Primary hypertension Counseled on blood pressure goal of less than 130/80, low-sodium, DASH diet, medication compliance, 150 minutes of moderate intensity exercise per week. Discussed medication compliance, adverse effects.  -     losartan (COZAAR) 25 MG tablet; Take 2 tablets (50 mg total) by mouth at bedtime. -     hydrochlorothiazide (HYDRODIURIL) 25 MG tablet; Take 1 tablet (25 mg total) by mouth daily.  Liver mass, left lobe Followed by oncology   Acute gastric ulcer with perforation ([name] [name]) Followed by GI and oncology   Encounter to establish care Establish care with PCP  Type 2 diabetes mellitus without complication, with long-term current use of insulin ([name] [name])  Goal of therapy: Less than 6.5 hemoglobin A1c.  Reference clinical practice recommendations.decrease foods that are high in carbohydrates are the following rice, potatoes, breads, sugars, and pastas.  Reduction in the intake (eating) will assist in lowering your blood sugars.  -     insulin NPH-regular Human (NOVOLIN 70/30) (70-30) 100 UNIT/ML injection; Inject 15 Units into the skin 2 (two) times daily with a meal.    This note has been created with Surveyor, quantity. Any transcriptional errors are unintentional.   Kerin Perna, NP 09/10/2020, 9:39 AM

## 2020-09-10 NOTE — Patient Instructions (Signed)
Insulin Aspart; Insulin Aspart Protamine injection What is this medication? INSULIN ASPART; INSULIN ASPART PROTAMINE (IN su lin AS part; IN su lin AS part PRO ta meen) is a human-made form of insulin. This drug lowers the amount of sugar in your blood. This medicine is a mixture of a rapid-acting insulin and a longer-acting insulin. It starts working 10 to 20 minutes after injection andcontinues to work for as long as 12 to 24 hours. This medicine may be used for other purposes; ask your health care provider orpharmacist if you have questions. COMMON BRAND NAME(S): NovoLog Mix 70/30 What should I tell my care team before I take this medication? They need to know if you have any of these conditions: episodes of low blood sugar eye disease, vision problems kidney disease liver disease an unusual or allergic reaction to insulin, metacresol, other medicines, foods, dyes, or preservatives pregnant or trying to get pregnant breast-feeding How should I use this medication? This medicine is for injection under the skin. Use exactly as directed. It is important to follow the directions given to you by your health care professional or doctor. You should inject this medicine within 15 minutes of starting your meal. You will be taught how to use this medicine and how to adjust doses for activities and illness. Do not use more insulin thanprescribed. Do not use more or less often than prescribed. Always check the appearance of your insulin before using it. This medicine should be white and cloudy. Do not use if it is not uniformly cloudy after mixing. To mix this medicine, roll the vial gently 10 times in your hands. Make sure to perform the mixing procedures before each injection. Do not mix thismedicine with any other insulin or diluent. If you use a pen, be sure to take off the outer needle cover before using the dose. It is important that you put your used needles and syringes in a special sharps container.  Do not put them in a trash can. If you do not have a sharpscontainer, call your pharmacist or healthcare provider to get one. This drug comes with INSTRUCTIONS FOR USE. Ask your pharmacist for directions on how to use this drug. Read the information carefully. Talk to yourpharmacist or health care provider if you have questions. Talk to your pediatrician regarding the use of this medicine in children.Special care may be needed. Overdosage: If you think you have taken too much of this medicine contact apoison control center or emergency room at once. NOTE: This medicine is only for you. Do not share this medicine with others. What if I miss a dose? It is important not to miss a dose. Your health care professional or doctor should discuss a plan for missed doses with you. If you do miss a dose, followtheir plan. Do not take double doses. What may interact with this medication? other medicines for diabetes Many medications may cause changes in blood sugar, these include: alcohol containing beverages antiviral medicines for HIV or AIDS aspirin and aspirin-like drugs certain medicines for blood pressure, heart disease, irregular heart beat chromium diuretics female hormones, such as estrogens or progestins, birth control pills fenofibrate gemfibrozil isoniazid lanreotide female hormones or anabolic steroids MAOIs like Carbex, Eldepryl, Marplan, Nardil, and Parnate medicines for weight loss medicines for allergies, asthma, cold, or cough medicines for depression, anxiety, or psychotic disturbances niacin nicotine NSAIDs, medicines for pain and inflammation, like ibuprofen or naproxen octreotide pasireotide pentamidine phenytoin probenecid quinolone antibiotics such as ciprofloxacin, levofloxacin, ofloxacin some herbal  dietary supplements steroid medicines such as prednisone or cortisone sulfamethoxazole; trimethoprim thyroid hormones Some medications can hide the warning symptoms of  low blood sugar (hypoglycemia). You may need to monitor your blood sugar more closely if youare taking one of these medications. These include: beta-blockers, often used for high blood pressure or heart problems (examples include atenolol, metoprolol, propranolol) clonidine guanethidine reserpine This list may not describe all possible interactions. Give your health care provider a list of all the medicines, herbs, non-prescription drugs, or dietary supplements you use. Also tell them if you smoke, drink alcohol, or use illegaldrugs. Some items may interact with your medicine. What should I watch for while using this medication? Visit your health care professional or doctor for regular checks on yourprogress. A test called the HbA1C (A1C) will be monitored. This is a simple blood test. It measures your blood sugar control over the last 2 to 3 months. You willreceive this test every 3 to 6 months. Learn how to check your blood sugar. Learn the symptoms of low and high bloodsugar and how to manage them. Always carry a quick-source of sugar with you in case you have symptoms of low blood sugar. Examples include hard sugar candy or glucose tablets. Make sure others know that you can choke if you eat or drink when you develop serious symptoms of low blood sugar, such as seizures or unconsciousness. They must getmedical help at once. Tell your doctor or health care professional if you have high blood sugar. You might need to change the dose of your medicine. If you are sick or exercisingmore than usual, you might need to change the dose of your medicine. Do not skip meals. Ask your doctor or health care professional if you should avoid alcohol. Many nonprescription cough and cold products contain sugar oralcohol. These can affect blood sugar. Make sure that you have the right kind of syringe for the type of insulin you use. Try not to change the brand and type of insulin or syringe unless your health care  professional or doctor tells you to. Switching insulin brand or type can cause dangerously high or low blood sugar. Always keep an extra supply of insulin, syringes, and needles on hand. Use a syringe one time only. Throw away syringe and needle in a closed container to prevent accidental needlesticks. Insulin pens and cartridges should never be shared. Even if the needle ischanged, sharing may result in passing of viruses like hepatitis or HIV. Each time you get a new box of pen needles, check to see if they are the same type as the ones you were trained to use. If not, ask your health careprofessional to show you how to use this new type properly. Wear a medical ID bracelet or chain, and carry a card that describes yourdisease and details of your medicine and dosage times. What side effects may I notice from receiving this medication? Side effects that you should report to your doctor or health care professionalas soon as possible: allergic reactions like skin rash, itching or hives, swelling of the face, lips, or tongue breathing problems signs and symptoms of high blood sugar such as dizziness, dry mouth, dry skin, fruity breath, nausea, stomach pain, increased hunger or thirst, increased urination signs and symptoms of low blood sugar such as feeling anxious, confusion, dizziness, increased hunger, unusually weak or tired, sweating, shakiness, cold, irritable, headache, blurred vision, fast heartbeat, loss of consciousness Side effects that usually do not require medical attention (report to yourdoctor or  health care professional if they continue or are bothersome): increase or decrease in fatty tissue under the skin due to overuse of a particular injection site itching, burning, swelling, or rash at site where injected This list may not describe all possible side effects. Call your doctor for medical advice about side effects. You may report side effects to FDA at1-800-FDA-1088. Where should I  keep my medication? Keep out of the reach of children. Unopened Vials: Novolog Mix Vials: Store in a refrigerator between 2 and 8 degrees C (36 and 46 degrees F) or at room temperature below 30 degrees C (86 degrees F). Do not freeze or use if the insulin has been frozen. Protect from light and excessive heat. If stored at room temperature, the vial must be discarded after 28 days. Throw away any unopened and unused medicine that has been stored in therefrigerator after the expiration date. Unopened Pens: Novolog Mix FlexPens: Store in a refrigerator between 2 and 8 degrees C (36 and 46 degrees F) or at room temperature below 30 degrees C (86 degrees F). Do not freeze or use if the insulin has been frozen. Protect from light and excessive heat. If stored at room temperature, the pen must be discarded after 14 days. Throw away any unopened and unused medicine that has been stored in therefrigerator after the expiration date. Vials that you are using: Novolog Mix Vials: Store in the refrigerator or at room temperature below 30 degrees C (86 degrees F). Do not freeze. Keep away from heat and light. Throwthe opened vial away after 28 days. Pens that you are using: Novolog Mix FlexPens: Store at room temperature below 30 degrees C (86 degrees F). Do not refrigerate or freeze. Keep away from heat and light. Throw the penaway after 14 days, even if it still has insulin left in it. NOTE: This sheet is a summary. It may not cover all possible information. If you have questions about this medicine, talk to your doctor, pharmacist, orhealth care provider.  2022 Elsevier/Gold Standard (2018-10-17 08:04:20)

## 2020-09-11 ENCOUNTER — Inpatient Hospital Stay: Payer: Self-pay

## 2020-09-11 ENCOUNTER — Telehealth (INDEPENDENT_AMBULATORY_CARE_PROVIDER_SITE_OTHER): Payer: Self-pay | Admitting: Primary Care

## 2020-09-11 ENCOUNTER — Other Ambulatory Visit: Payer: Self-pay

## 2020-09-11 ENCOUNTER — Encounter: Payer: Self-pay | Admitting: *Deleted

## 2020-09-11 ENCOUNTER — Encounter (INDEPENDENT_AMBULATORY_CARE_PROVIDER_SITE_OTHER): Payer: Self-pay | Admitting: Primary Care

## 2020-09-11 VITALS — BP 100/51 | HR 83 | Temp 98.2°F | Resp 18

## 2020-09-11 DIAGNOSIS — C220 Liver cell carcinoma: Secondary | ICD-10-CM

## 2020-09-11 LAB — CBC WITH DIFFERENTIAL (CANCER CENTER ONLY)
Abs Immature Granulocytes: 0.02 10*3/uL (ref 0.00–0.07)
Basophils Absolute: 0 10*3/uL (ref 0.0–0.1)
Basophils Relative: 1 %
Eosinophils Absolute: 0.1 10*3/uL (ref 0.0–0.5)
Eosinophils Relative: 1 %
HCT: 35.6 % — ABNORMAL LOW (ref 36.0–46.0)
Hemoglobin: 11.3 g/dL — ABNORMAL LOW (ref 12.0–15.0)
Immature Granulocytes: 0 %
Lymphocytes Relative: 27 %
Lymphs Abs: 1.5 10*3/uL (ref 0.7–4.0)
MCH: 29.4 pg (ref 26.0–34.0)
MCHC: 31.7 g/dL (ref 30.0–36.0)
MCV: 92.5 fL (ref 80.0–100.0)
Monocytes Absolute: 0.3 10*3/uL (ref 0.1–1.0)
Monocytes Relative: 5 %
Neutro Abs: 3.7 10*3/uL (ref 1.7–7.7)
Neutrophils Relative %: 66 %
Platelet Count: 315 10*3/uL (ref 150–400)
RBC: 3.85 MIL/uL — ABNORMAL LOW (ref 3.87–5.11)
RDW: 15.5 % (ref 11.5–15.5)
WBC Count: 5.7 10*3/uL (ref 4.0–10.5)
nRBC: 0 % (ref 0.0–0.2)

## 2020-09-11 LAB — CMP (CANCER CENTER ONLY)
ALT: 38 U/L (ref 0–44)
AST: 42 U/L — ABNORMAL HIGH (ref 15–41)
Albumin: 3.7 g/dL (ref 3.5–5.0)
Alkaline Phosphatase: 211 U/L — ABNORMAL HIGH (ref 38–126)
Anion gap: 5 (ref 5–15)
BUN: 18 mg/dL (ref 8–23)
CO2: 26 mmol/L (ref 22–32)
Calcium: 10.6 mg/dL — ABNORMAL HIGH (ref 8.9–10.3)
Chloride: 102 mmol/L (ref 98–111)
Creatinine: 0.68 mg/dL (ref 0.44–1.00)
GFR, Estimated: >60 mL/min (ref >60)
Glucose, Bld: 291 mg/dL — ABNORMAL HIGH (ref 70–99)
Potassium: 4.6 mmol/L (ref 3.5–5.1)
Sodium: 133 mmol/L — ABNORMAL LOW (ref 135–145)
Total Bilirubin: 0.7 mg/dL (ref 0.3–1.2)
Total Protein: 8.2 g/dL — ABNORMAL HIGH (ref 6.5–8.1)

## 2020-09-11 MED ORDER — SODIUM CHLORIDE 0.9 % IV SOLN: 200.0000 mg | Freq: Once | INTRAVENOUS | Status: DC

## 2020-09-11 MED ORDER — SODIUM CHLORIDE 0.9 % IV SOLN
3.0000 mg/kg | Freq: Once | INTRAVENOUS | Status: AC
  Administered 2020-09-11: 200 mg via INTRAVENOUS
  Filled 2020-09-11: qty 40

## 2020-09-11 MED ORDER — SODIUM CHLORIDE 0.9 % IV SOLN
1.0000 mg/kg | Freq: Once | INTRAVENOUS | Status: AC
  Administered 2020-09-11: 65 mg via INTRAVENOUS
  Filled 2020-09-11: qty 6.5

## 2020-09-11 MED ORDER — DIPHENHYDRAMINE HCL 50 MG/ML IJ SOLN: 25.0000 mg | Freq: Once | INTRAMUSCULAR | Status: DC

## 2020-09-11 MED ORDER — SODIUM CHLORIDE 0.9 % IV SOLN: 1.0000 mg/kg | Freq: Once | INTRAVENOUS | Status: DC

## 2020-09-11 MED ORDER — DIPHENHYDRAMINE HCL 50 MG/ML IJ SOLN
25.0000 mg | Freq: Once | INTRAMUSCULAR | Status: AC
  Administered 2020-09-11: 25 mg via INTRAVENOUS

## 2020-09-11 MED ORDER — NIFEDIPINE ER 30 MG PO TB24: 30.0000 mg | ORAL_TABLET | Freq: Two times a day (BID) | ORAL | 1 refills | Status: DC

## 2020-09-11 MED ORDER — FAMOTIDINE 20 MG IN NS 100 ML IVPB
20.0000 mg | Freq: Two times a day (BID) | INTRAVENOUS | Status: DC
  Filled 2020-09-11: qty 100

## 2020-09-11 MED ORDER — FAMOTIDINE 20 MG IN NS 100 ML IVPB
20.0000 mg | Freq: Two times a day (BID) | INTRAVENOUS | Status: DC
  Administered 2020-09-11: 20 mg via INTRAVENOUS
  Filled 2020-09-11: qty 20

## 2020-09-11 MED ORDER — SODIUM CHLORIDE 0.9 % IV SOLN
Freq: Once | INTRAVENOUS | Status: AC
  Filled 2020-09-11: qty 250

## 2020-09-11 MED ORDER — SODIUM CHLORIDE 0.9 % IV SOLN
Freq: Once | INTRAVENOUS | Status: DC
  Filled 2020-09-11: qty 250

## 2020-09-11 MED ORDER — DIPHENHYDRAMINE HCL 50 MG/ML IJ SOLN
INTRAMUSCULAR | Status: AC
  Filled 2020-09-11: qty 1

## 2020-09-11 NOTE — Progress Notes (Signed)
Visited with patient and her daughter prior to treatment. They are both ready to begin treatment. Nurse Educator present to provide chemo education. Encouraged both of them to call if any questions or concerns arose. Also informed them that I would call tomorrow just to check on patient after her first treatment.   Oncology Nurse Navigator Documentation  Oncology Nurse Navigator Flowsheets 09/11/2020  Abnormal Finding Date -  Confirmed Diagnosis Date -  Diagnosis Status -  Planned Course of Treatment -  Phase of Treatment Chemo  Chemotherapy Actual Start Date: 09/11/2020  Navigator Follow Up Date: 10/03/2020  Navigator Follow Up Reason: Chemotherapy;Other:  Navigator Comptroller  Referral Date to RadOnc/MedOnc -  Navigator Encounter Type Treatment  Telephone -  Multidisiplinary Clinic Date -  Multidisiplinary Clinic Type -  Treatment Initiated Date 09/11/2020  Patient Visit Type MedOnc  Treatment Phase Pre-Tx/Tx Discussion  Barriers/Navigation Needs Coordination of Care;Education  Education Other  Interventions Education;Psycho-Social Support  Acuity Level 2-Minimal Needs (1-2 Barriers Identified)  Coordination of Care -  Education Method Verbal  Support Groups/Services Friends and Family  Time Spent with Patient 30

## 2020-09-11 NOTE — Telephone Encounter (Addendum)
Pt seen [name] [name] yesterday and  daughter [name] [name] is calling and general surgery said her mother needs to take pantoprazole 40 mg twice a day and has only 2 pills left. Pt needs new rx from [name] [name] also pt needs new rx  for nifedipine 30 mg twice a day. Pt daughter forgot to mention to [facility] on yesterday. [facility]  [place] [phone]

## 2020-09-11 NOTE — Patient Instructions (Signed)
[facility] [place]  Discharge Instructions: Thank you for choosing [facility] to provide your oncology and hematology care.   If you have a lab appointment with the [facility], please go directly to the [facility] and check in at the registration area.  Wear comfortable clothing and clothing appropriate for easy access to any Portacath or PICC line.   We strive to give you quality time with your provider. You may need to reschedule your appointment if you arrive late (15 or more minutes).  Arriving late affects you and other patients whose appointments are after yours.  Also, if you miss three or more appointments without notifying the office, you may be dismissed from the clinic at the provider's discretion.      For prescription refill requests, have your pharmacy contact our office and allow 72 hours for refills to be completed.    Today you received the following chemotherapy and/or immunotherapy agents Yervoy, Opdivo      To help prevent nausea and vomiting after your treatment, we encourage you to take your nausea medication as directed.  BELOW ARE SYMPTOMS THAT SHOULD BE REPORTED IMMEDIATELY: *FEVER GREATER THAN 100.4 F (38 C) OR HIGHER *CHILLS OR SWEATING *NAUSEA AND VOMITING THAT IS NOT CONTROLLED WITH YOUR NAUSEA MEDICATION *UNUSUAL SHORTNESS OF BREATH *UNUSUAL BRUISING OR BLEEDING *URINARY PROBLEMS (pain or burning when urinating, or frequent urination) *BOWEL PROBLEMS (unusual diarrhea, constipation, pain near the anus) TENDERNESS IN MOUTH AND THROAT WITH OR WITHOUT PRESENCE OF ULCERS (sore throat, sores in mouth, or a toothache) UNUSUAL RASH, SWELLING OR PAIN  UNUSUAL VAGINAL DISCHARGE OR ITCHING   Items with * indicate a potential emergency and should be followed up as soon as possible or go to the Emergency Department if any problems should occur.  Please show the CHEMOTHERAPY ALERT CARD or IMMUNOTHERAPY ALERT CARD at check-in to  the Emergency Department and triage nurse. Should you have questions after your visit or need to cancel or reschedule your appointment, please contact [facility]  [phone] and follow the prompts.  Office hours are 8:00 a.m. to 4:30 p.m. Monday - Friday. Please note that voicemails left after 4:00 p.m. may not be returned until the following business day.  We are closed weekends and major holidays. You have access to a nurse at all times for urgent questions. Please call the main number to the clinic [phone] and follow the prompts.  For any non-urgent questions, you may also contact your provider using MyChart. We now offer e-Visits for anyone 44 and older to request care online for non-urgent symptoms. For details visit [email or web address].   Also download the MyChart app! Go to the app store, search "MyChart", open the app, select [facility], and log in with your MyChart username and password.  Due to Covid, a mask is required upon entering the hospital/clinic. If you do not have a mask, one will be given to you upon arrival. For doctor visits, patients may have 1 support person aged 63 or older with them. For treatment visits, patients cannot have anyone with them due to current Covid guidelines and our immunocompromised population.   Ipilimumab injection What is this medication? IPILIMUMAB (IP i LIM ue mab) is a monoclonal antibody. It is used to treat colorectal cancer, kidney cancer, liver cancer, lung cancer, melanoma, andmesothelioma. This medicine may be used for other purposes; ask your health care provider orpharmacist if you have questions. COMMON BRAND NAME(S):  YERVOY What should I tell my care team before I take this medication? They need to know if you have any of these conditions: autoimmune diseases like Crohn's disease, ulcerative colitis, or lupus have had or planning to have an allogeneic stem cell transplant (uses someone else's stem  cells) history of organ transplant nervous system problems like myasthenia gravis or Guillain-Barre syndrome an unusual or allergic reaction to ipilimumab, other medicines, foods, dyes, or preservatives pregnant or trying to get pregnant breast-feeding How should I use this medication? This medicine is for infusion into a vein. It is given by a health careprofessional in a hospital or clinic setting. A special MedGuide will be given to you before each treatment. Be sure to readthis information carefully each time. Talk to your pediatrician regarding the use of this medicine in children. While this drug may be prescribed for children as young as 12 years for selectedconditions, precautions do apply. Overdosage: If you think you have taken too much of this medicine contact apoison control center or emergency room at once. NOTE: This medicine is only for you. Do not share this medicine with others. What if I miss a dose? It is important not to miss your dose. Call your doctor or health careprofessional if you are unable to keep an appointment. What may interact with this medication? Interactions are not expected. This list may not describe all possible interactions. Give your health care provider a list of all the medicines, herbs, non-prescription drugs, or dietary supplements you use. Also tell them if you smoke, drink alcohol, or use illegaldrugs. Some items may interact with your medicine. What should I watch for while using this medication? Tell your doctor or healthcare professional if your symptoms do not start toget better or if they get worse. Do not become pregnant while taking this medicine or for 3 months after stopping it. Women should inform their doctor if they wish to become pregnant or think they might be pregnant. There is a potential for serious side effects to an unborn child. Talk to your health care professional or pharmacist for more information. Do not breast-feed an infant  while taking this medicine orfor 3 months after the last dose. Your condition will be monitored carefully while you are receiving thismedicine. You may need blood work done while you are taking this medicine. What side effects may I notice from receiving this medication? Side effects that you should report to your doctor or health care professionalas soon as possible: allergic reactions like skin rash, itching or hives, swelling of the face, lips, or tongue black, tarry stools bloody or watery diarrhea changes in vision dizziness eye pain fast, irregular heartbeat feeling anxious feeling faint or lightheaded, falls nausea, vomiting pain, tingling, numbness in the hands or feet redness, blistering, peeling or loosening of the skin, including inside the mouth signs and symptoms of liver injury like dark yellow or brown urine; general ill feeling or flu-like symptoms; light-colored stools; loss of appetite; nausea; right upper belly pain; unusually weak or tired; yellowing of the eyes or skin unusual bleeding or bruising Side effects that usually do not require medical attention (report to yourdoctor or health care professional if they continue or are bothersome): headache loss of appetite trouble sleeping This list may not describe all possible side effects. Call your doctor for medical advice about side effects. You may report side effects to FDA at1-800-FDA-1088. Where should I keep my medication? This drug is given in a hospital or clinic and will not  be stored at home. NOTE: This sheet is a summary. It may not cover all possible information. If you have questions about this medicine, talk to your doctor, pharmacist, orhealth care provider.  2022 Elsevier/Gold Standard (2019-01-03 18:53:00) Nivolumab injection What is this medication? NIVOLUMAB (nye VOL ue mab) is a monoclonal antibody. It treats certain types of cancer. Some of the cancers treated are colon cancer, head and neck  cancer,Hodgkin lymphoma, lung cancer, and melanoma. This medicine may be used for other purposes; ask your health care provider orpharmacist if you have questions. COMMON BRAND NAME(S): Opdivo What should I tell my care team before I take this medication? They need to know if you have any of these conditions: autoimmune diseases like Crohn's disease, ulcerative colitis, or lupus have had or planning to have an allogeneic stem cell transplant (uses someone else's stem cells) history of chest radiation history of organ transplant nervous system problems like myasthenia gravis or Guillain-Barre syndrome an unusual or allergic reaction to nivolumab, other medicines, foods, dyes, or preservatives pregnant or trying to get pregnant breast-feeding How should I use this medication? This medicine is for infusion into a vein. It is given by a health careprofessional in a hospital or clinic setting. A special MedGuide will be given to you before each treatment. Be sure to readthis information carefully each time. Talk to your pediatrician regarding the use of this medicine in children. While this drug may be prescribed for children as young as 12 years for selectedconditions, precautions do apply. Overdosage: If you think you have taken too much of this medicine contact apoison control center or emergency room at once. NOTE: This medicine is only for you. Do not share this medicine with others. What if I miss a dose? It is important not to miss your dose. Call your doctor or health careprofessional if you are unable to keep an appointment. What may interact with this medication? Interactions have not been studied. This list may not describe all possible interactions. Give your health care provider a list of all the medicines, herbs, non-prescription drugs, or dietary supplements you use. Also tell them if you smoke, drink alcohol, or use illegaldrugs. Some items may interact with your medicine. What  should I watch for while using this medication? This drug may make you feel generally unwell. Continue your course of treatmenteven though you feel ill unless your doctor tells you to stop. You may need blood work done while you are taking this medicine. Do not become pregnant while taking this medicine or for 5 months after stopping it. Women should inform their doctor if they wish to become pregnant or think they might be pregnant. There is a potential for serious side effects to an unborn child. Talk to your health care professional or pharmacist for more information. Do not breast-feed an infant while taking this medicine orfor 5 months after stopping it. What side effects may I notice from receiving this medication? Side effects that you should report to your doctor or health care professionalas soon as possible: allergic reactions like skin rash, itching or hives, swelling of the face, lips, or tongue breathing problems blood in the urine bloody or watery diarrhea or black, tarry stools changes in emotions or moods changes in vision chest pain cough dizziness feeling faint or lightheaded, falls fever, chills headache with fever, neck stiffness, confusion, loss of memory, sensitivity to light, hallucination, loss of contact with reality, or seizures joint pain mouth sores redness, blistering, peeling or loosening of the  skin, including inside the mouth severe muscle pain or weakness signs and symptoms of high blood sugar such as dizziness; dry mouth; dry skin; fruity breath; nausea; stomach pain; increased hunger or thirst; increased urination signs and symptoms of kidney injury like trouble passing urine or change in the amount of urine signs and symptoms of liver injury like dark yellow or brown urine; general ill feeling or flu-like symptoms; light-colored stools; loss of appetite; nausea; right upper belly pain; unusually weak or tired; yellowing of the eyes or skin swelling of the  ankles, feet, hands trouble passing urine or change in the amount of urine unusually weak or tired weight gain or loss Side effects that usually do not require medical attention (report to yourdoctor or health care professional if they continue or are bothersome): bone pain constipation decreased appetite diarrhea muscle pain nausea, vomiting tiredness This list may not describe all possible side effects. Call your doctor for medical advice about side effects. You may report side effects to FDA at1-800-FDA-1088. Where should I keep my medication? This drug is given in a hospital or clinic and will not be stored at home. NOTE: This sheet is a summary. It may not cover all possible information. If you have questions about this medicine, talk to your doctor, pharmacist, orhealth care provider.  2022 Elsevier/Gold Standard (2019-06-06 10:08:25)

## 2020-09-11 NOTE — Progress Notes (Signed)
Patient in chemotherapy education class with  Daughter Panji.  Discussed side effects of Yervoy and Opdivo        which include but are not limited to myelosuppression, decreased appetite, fatigue, fever, allergic or infusional reaction, cough, SOB, altered taste, nausea and vomiting, diarrhea, constipation, elevated LFTs myalgia and arthralgias, hair loss or thinning, rash, skin dryness, nail changes,  delayed wound healing, mental changes (Chemo brain), increased risk of infections, weight loss.  Reviewed infusion room and office policy and procedure and phone numbers 24 hours x 7 days a week.  Reviewed when to call the office with any concerns or problems.  Scientist, clinical (histocompatibility and immunogenetics) given.  Discussed portacath insertion and EMLA cream administration.  Antiemetic protocol and chemotherapy schedule reviewed. Patient verbalized understanding of chemotherapy indications and possible side effects.  Teachback done

## 2020-09-12 ENCOUNTER — Other Ambulatory Visit: Payer: Self-pay

## 2020-09-12 ENCOUNTER — Encounter: Payer: Self-pay | Admitting: *Deleted

## 2020-09-12 LAB — T4: T4, Total: 15.3 ug/dL — ABNORMAL HIGH (ref 4.5–12.0)

## 2020-09-12 LAB — TSH: TSH: 4.707 u[IU]/mL — ABNORMAL HIGH (ref 0.308–3.960)

## 2020-09-12 NOTE — Progress Notes (Signed)
Called and spoke to patient, and her daughter, [name] [name]. Patient is doing well today without any side effects from her first treatment yesterday. No questions or concerns at this time.   Reviewed side effects and their management. Also reviewed the oncall service for times the office is closed. They understand to reach out if there are any questions or concerns.   Oncology Nurse Navigator Documentation  Oncology Nurse Navigator Flowsheets 09/12/2020  Abnormal Finding Date -  Confirmed Diagnosis Date -  Diagnosis Status -  Planned Course of Treatment -  Phase of Treatment -  Chemotherapy Actual Start Date: -  Navigator Follow Up Date: 10/03/2020  Navigator Follow Up Reason: Follow-up Appointment;Chemotherapy  Navigator Location [facility]  Referral Date to RadOnc/MedOnc -  Navigator Encounter Type Telephone  Telephone Patient Update;Outgoing Call  [facility] Date -  Multidisiplinary Clinic Type -  Treatment Initiated Date -  Patient Visit Type MedOnc  Treatment Phase Active Tx  Barriers/Navigation Needs Coordination of Care;Education  Education Other  Interventions Education;Psycho-Social Support  Acuity Level 2-Minimal Needs (1-2 Barriers Identified)  Coordination of Care -  Education Method Verbal  Support Groups/Services Friends and Family  Time Spent with Patient 30

## 2020-09-17 ENCOUNTER — Other Ambulatory Visit: Payer: Self-pay | Admitting: Radiology

## 2020-09-18 ENCOUNTER — Other Ambulatory Visit: Payer: Self-pay | Admitting: Hematology & Oncology

## 2020-09-18 ENCOUNTER — Ambulatory Visit (HOSPITAL_COMMUNITY)
Admission: RE | Admit: 2020-09-18 | Discharge: 2020-09-18 | Disposition: A | Discharge status: Home / Self Care | Payer: Self-pay | Source: Ambulatory Visit | Attending: Hematology & Oncology | Admitting: Hematology & Oncology

## 2020-09-18 ENCOUNTER — Encounter (HOSPITAL_COMMUNITY): Payer: Self-pay

## 2020-09-18 ENCOUNTER — Other Ambulatory Visit: Payer: Self-pay

## 2020-09-18 DIAGNOSIS — C220 Liver cell carcinoma: Secondary | ICD-10-CM | POA: Insufficient documentation

## 2020-09-18 DIAGNOSIS — H409 Unspecified glaucoma: Secondary | ICD-10-CM | POA: Insufficient documentation

## 2020-09-18 DIAGNOSIS — Z880 Allergy status to penicillin: Secondary | ICD-10-CM | POA: Insufficient documentation

## 2020-09-18 DIAGNOSIS — Z79899 Other long term (current) drug therapy: Secondary | ICD-10-CM | POA: Insufficient documentation

## 2020-09-18 DIAGNOSIS — Z794 Long term (current) use of insulin: Secondary | ICD-10-CM | POA: Insufficient documentation

## 2020-09-18 DIAGNOSIS — Z881 Allergy status to other antibiotic agents status: Secondary | ICD-10-CM | POA: Insufficient documentation

## 2020-09-18 DIAGNOSIS — I10 Essential (primary) hypertension: Secondary | ICD-10-CM | POA: Insufficient documentation

## 2020-09-18 DIAGNOSIS — E119 Type 2 diabetes mellitus without complications: Secondary | ICD-10-CM | POA: Insufficient documentation

## 2020-09-18 HISTORY — PX: IR IMAGING GUIDED PORT INSERTION: IMG5740

## 2020-09-18 LAB — GLUCOSE, CAPILLARY: Glucose-Capillary: 140 mg/dL — ABNORMAL HIGH (ref 70–99)

## 2020-09-18 IMAGING — XA IR IMAGING GUIDED PORT INSERTION
2 series · 2 of 2 positions shown · non-contrast
Comparison: none

CLINICAL DATA: HEPATOCELLULAR CARCINOMA

[Series 1: ir fluoro/shunt/fist · 1 of 1 slices shown]
[im 1/1]
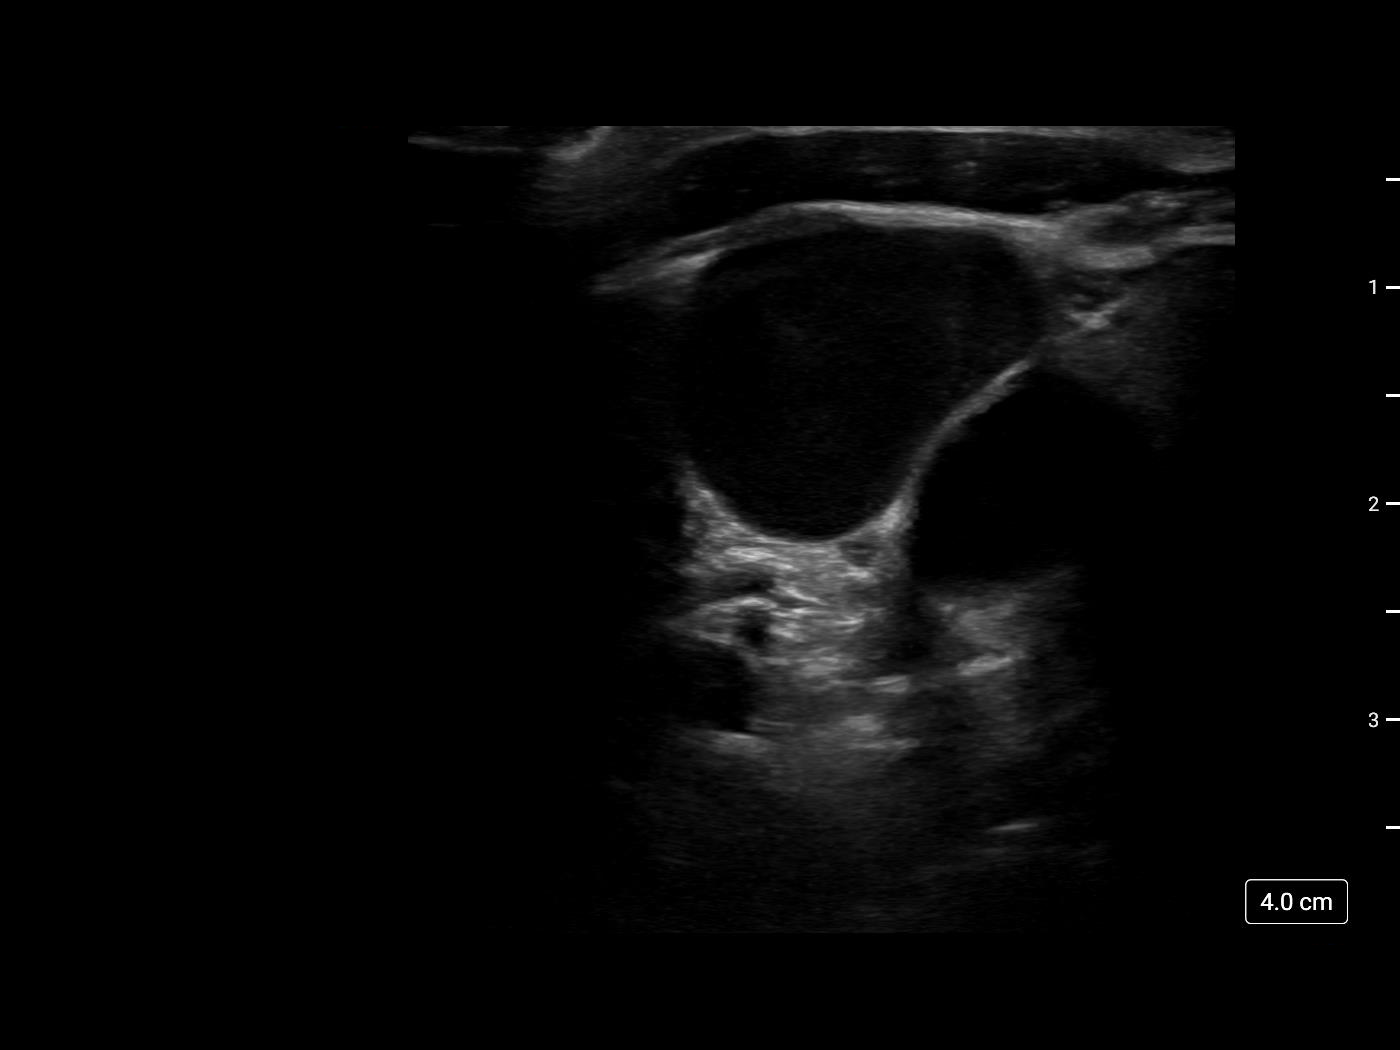

[Series 300: ir imaging guided port insertion · 1 of 1 slices shown]
[im 1/1]
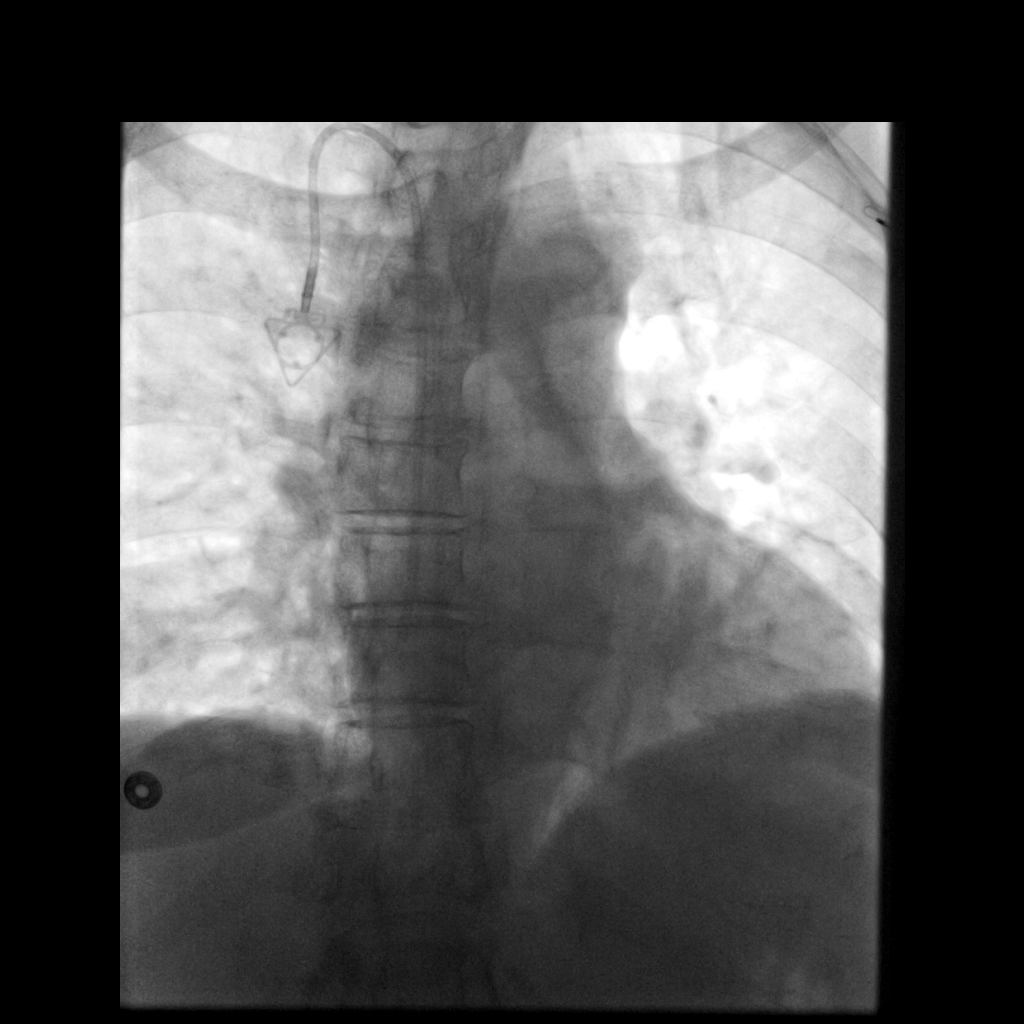

[2 of 2 positions shown; findings below may reference images not displayed]

EXAM:
RIGHT INTERNAL JUGULAR SINGLE LUMEN POWER PORT CATHETER INSERTION

Date:  [DATE] [DATE] [DATE]

Radiologist:  JUAN DE DIOS

Guidance:  Ultrasound and fluoroscopic

MEDICATIONS:
1% lidocaine local with epinephrine

ANESTHESIA/SEDATION:
Versed 2.0 mg IV; Fentanyl 100 mcg IV;

Moderate Sedation Time:  24 minutes

The patient was continuously monitored during the procedure by the
interventional radiology nurse under my direct supervision.

FLUOROSCOPY TIME:  One minutes, 6 seconds (5 mGy)

COMPLICATIONS:
None immediate.

CONTRAST:  None.

PROCEDURE:
Informed consent was obtained from the patient following explanation
of the procedure, risks, benefits and alternatives. The patient
understands, agrees and consents for the procedure. All questions
were addressed. A time out was performed.

Maximal barrier sterile technique utilized including caps, mask,
sterile gowns, sterile gloves, large sterile drape, hand hygiene,
and 2% chlorhexidine scrub.

Under sterile conditions and local anesthesia, right internal
jugular micropuncture venous access was performed. Access was
performed with ultrasound. Images were obtained for documentation of
the patent right internal jugular vein. A guide wire was inserted
followed by a transitional dilator. This allowed insertion of a
guide wire and catheter into the IVC. Measurements were obtained
from the SVC / RA junction back to the right IJ venotomy site. In
the right infraclavicular chest, a subcutaneous pocket was created
over the second anterior rib. This was done under sterile conditions
and local anesthesia. 1% lidocaine with epinephrine was utilized for
this. A 2.5 cm incision was made in the skin. Blunt dissection was
performed to create a subcutaneous pocket over the right pectoralis
major muscle. The pocket was flushed with saline vigorously. There
was adequate hemostasis. The port catheter was assembled and checked
for leakage. The port catheter was secured in the pocket with two
retention sutures. The tubing was tunneled subcutaneously to the
right venotomy site and inserted into the SVC/RA junction through a
valved peel-away sheath. Position was confirmed with fluoroscopy.
Images were obtained for documentation. The patient tolerated the
procedure well. No immediate complications. Incisions were closed in
a two layer fashion with 4 - 0 Vicryl suture. Dermabond was applied
to the skin. The port catheter was accessed, blood was aspirated
followed by saline and heparin flushes. Needle was removed. A dry
sterile dressing was applied.
IMPRESSION: Ultrasound and fluoroscopically guided right internal jugular single
lumen power port catheter insertion. Tip in the SVC/RA junction.
Catheter ready for use.

## 2020-09-18 MED ORDER — HEPARIN SOD (PORK) LOCK FLUSH 100 UNIT/ML IV SOLN
INTRAVENOUS | Status: AC | PRN
  Administered 2020-09-18: 500 [IU] via INTRAVENOUS

## 2020-09-18 MED ORDER — MIDAZOLAM HCL 2 MG/2ML IJ SOLN
INTRAMUSCULAR | Status: AC | PRN
  Administered 2020-09-18 (×2): 1 mg via INTRAVENOUS

## 2020-09-18 MED ORDER — MIDAZOLAM HCL 2 MG/2ML IJ SOLN
INTRAMUSCULAR | Status: AC
  Filled 2020-09-18: qty 2

## 2020-09-18 MED ORDER — LIDOCAINE-EPINEPHRINE 1 %-1:100000 IJ SOLN
INTRAMUSCULAR | Status: AC | PRN
  Administered 2020-09-18 (×2): 10 mL via INTRADERMAL

## 2020-09-18 MED ORDER — SODIUM CHLORIDE 0.9 % IV SOLN: INTRAVENOUS | Status: DC

## 2020-09-18 MED ORDER — HEPARIN SOD (PORK) LOCK FLUSH 100 UNIT/ML IV SOLN
INTRAVENOUS | Status: AC
  Filled 2020-09-18: qty 5

## 2020-09-18 MED ORDER — FENTANYL CITRATE (PF) 100 MCG/2ML IJ SOLN
INTRAMUSCULAR | Status: AC
  Filled 2020-09-18: qty 2

## 2020-09-18 MED ORDER — LIDOCAINE-EPINEPHRINE 1 %-1:100000 IJ SOLN
INTRAMUSCULAR | Status: AC
  Filled 2020-09-18: qty 1

## 2020-09-18 MED ORDER — FENTANYL CITRATE (PF) 100 MCG/2ML IJ SOLN
INTRAMUSCULAR | Status: AC | PRN
  Administered 2020-09-18 (×2): 50 ug via INTRAVENOUS

## 2020-09-18 NOTE — Discharge Instructions (Signed)

## 2020-09-18 NOTE — Procedures (Signed)
Interventional Radiology Procedure Note  Procedure: RT IJ POWER PORT    Complications: None  Estimated Blood Loss:  MIN  Findings: TIP SVCRA    M. TREVOR Keyia Moretto, MD    

## 2020-09-18 NOTE — H&P (Signed)
Chief Complaint: Patient was seen in consultation today for port placement at the request of Ennever,Peter R  Referring Physician(s): Ennever,Peter R  Supervising Physician: Daryll Brod  Patient Status: [place]  History of Present Illness: [name] [name] is a 75 y.o. female with hepatocellular carcinoma. She is to start chemotherapy soon and is referred for port placement. PMHx, meds, labs, imaging, allergies reviewed. Feels well, no recent fevers, chills, illness. Has been NPO today since about 0830.    Past Medical History:  Diagnosis Date   Diabetes mellitus without complication ([place])    Glaucoma    Goals of care, counseling/discussion 09/01/2020   Hypertension     Past Surgical History:  Procedure Laterality Date   COLON RESECTION  2005   In [place] - ?colon cancer   LAPAROSCOPIC GASTRIC RESECTION N/A 08/09/2020   Procedure: LAPAROSCOPIC EXPLORATION OF ABDOMEN, PERFORATED ULCER REPAIR, LIVER BIOPSY;  Surgeon: Michael Boston, MD;  Location: [facility];  Service: General;  Laterality: N/A;    Allergies: Amoxicillin, Bactrim [sulfamethoxazole-trimethoprim], and Clarithromycin  Medications: Prior to Admission medications   Medication Sig Start Date End Date Taking? Authorizing Provider  acetaminophen (TYLENOL) 325 MG tablet Take 2 tablets (650 mg total) by mouth every 6 (six) hours as needed for mild pain, moderate pain or fever. 08/18/20  Yes Ileana Roup, MD  brimonidine (ALPHAGAN P) 0.1 % SOLN Place 1 drop into both eyes in the morning and at bedtime.   Yes [provider]  diphenhydramine-acetaminophen (TYLENOL PM) 25-500 MG TABS tablet Take 1 tablet by mouth at bedtime as needed (sleep).   Yes [provider]  hydrochlorothiazide (HYDRODIURIL) 25 MG tablet Take 1 tablet (25 mg total) by mouth daily. 09/10/20  Yes Kerin Perna, NP  losartan (COZAAR) 25 MG tablet Take 2 tablets (50 mg total) by mouth at bedtime.  09/10/20  Yes Kerin Perna, NP  NIFEdipine (ADALAT CC) 30 MG 24 hr tablet Take 1 tablet (30 mg total) by mouth in the morning and at bedtime. 09/11/20  Yes Kerin Perna, NP  PRESCRIPTION MEDICATION Inject 20 Units as directed in the morning and at bedtime. Rx from [place]   Yes [provider]  insulin NPH-regular Human (NOVOLIN 70/30) (70-30) 100 UNIT/ML injection Inject 15 Units into the skin 2 (two) times daily with a meal. 09/10/20   Kerin Perna, NP  ondansetron (ZOFRAN ODT) 4 MG disintegrating tablet Take 1 tablet (4 mg total) by mouth every 8 (eight) hours as needed for nausea or vomiting. Patient not taking: No sig reported 08/18/20   Clovis Riley, MD  oxyCODONE (OXY IR/ROXICODONE) 5 MG immediate release tablet Take 1-2 tablets (5-10 mg total) by mouth every 6 (six) hours as needed for moderate pain or severe pain. Patient not taking: No sig reported 08/18/20   Ileana Roup, MD  pantoprazole (PROTONIX) 40 MG tablet Take 1 tablet (40 mg total) by mouth daily. Patient not taking: No sig reported 08/18/20 08/18/21  Clovis Riley, MD  polyethylene glycol (MIRALAX / GLYCOLAX) 17 g packet Take 17 g by mouth daily as needed for moderate constipation.    [provider]  senna (SENOKOT) 8.6 MG tablet Take 2 tablets by mouth daily.    [provider]     History reviewed. No pertinent family history.  Social History   Socioeconomic History   Marital status: Widowed    Spouse name: Not on file   Number of children: 5  Years of education: 36   Highest education level: Associate degree: occupational, Hotel manager, or vocational program  Occupational History   Not on file  Tobacco Use   Smoking status: Never   Smokeless tobacco: Never  Vaping Use   Vaping Use: Never used  Substance and Sexual Activity   Alcohol use: Not Currently   Drug use: Never   Sexual activity: Not Currently  Other Topics Concern   Not on file  Social History  Narrative   Originally from [place].  Speaks [place] and [place]   Social Determinants of Radio broadcast assistant Strain: Not on [employer] Insecurity: Not on file  Transportation Needs: Not on file  Physical Activity: Not on file  Stress: Not on file  Social Connections: Not on file     Review of Systems: A 12 point ROS discussed and pertinent positives are indicated in the HPI above.  All other systems are negative.  Review of Systems  Vital Signs: BP (!) 141/70   Pulse 92   Temp 98.6 F (37 C) (Oral)   Resp 17   SpO2 94%   Physical Exam Constitutional:      General: She is not in acute distress.    Appearance: Normal appearance.  HENT:     Mouth/Throat:     Mouth: Mucous membranes are moist.     Pharynx: Oropharynx is clear.  Cardiovascular:     Rate and Rhythm: Normal rate and regular rhythm.     Heart sounds: Normal heart sounds.  Pulmonary:     Effort: No respiratory distress.     Breath sounds: Normal breath sounds.  Musculoskeletal:     Cervical back: Normal range of motion.  Skin:    General: Skin is warm and dry.  Neurological:     General: No focal deficit present.     Mental Status: She is alert and oriented to person, place, and time.  Psychiatric:        Mood and Affect: Mood normal.        Thought Content: Thought content normal.        Judgment: Judgment normal.    Imaging: No results found.  Labs:  CBC: Recent Labs    08/15/20 0345 08/16/20 0434 09/01/20 1041 09/11/20 1043  WBC 9.9 9.8 4.6 5.7  HGB 8.5* 8.6* 11.2* 11.3*  HCT 26.5* 26.2* 35.7* 35.6*  PLT 244 273 361 315    COAGS: No results for input(s): INR, APTT in the last 8760 hours.  BMP: Recent Labs    08/15/20 0345 08/16/20 0434 09/01/20 1041 09/11/20 1043  NA 138 135 137 133*  K 3.3* 3.5 4.5 4.6  CL 108 105 103 102  CO2 '24 23 28 26  '$ GLUCOSE 185* 218* 84 291*  BUN '13 10 13 18  '$ CALCIUM 9.1 9.1 10.7* 10.6*  CREATININE 0.56 0.52 0.61 0.68  GFRNONAA >60  >60 >60 >60    LIVER FUNCTION TESTS: Recent Labs    08/08/20 2224 08/10/20 0413 09/01/20 1041 09/11/20 1043  BILITOT 1.1 0.6 0.9 0.7  AST 43* 90* 43* 42*  ALT 63* 87* 27 38  ALKPHOS 157* 70 167* 211*  PROT 7.9 5.3* 8.4* 8.2*  ALBUMIN 3.9 2.6*  2.6* 3.7 3.7    TUMOR MARKERS: No results for input(s): AFPTM, CEA, CA199, CHROMGRNA in the last 8760 hours.  Assessment and Plan: Hepatocellular carcinoma For port placement Risks and benefits of image guided port-a-catheter placement was discussed with the patient including, but not  limited to bleeding, infection, pneumothorax, or fibrin sheath development and need for additional procedures.  All of the patient's questions were answered, patient is agreeable to proceed. Consent signed and in chart.   Thank you for this interesting consult.  I greatly enjoyed meeting [facility] and look forward to participating in their care.  A copy of this report was sent to the requesting provider on this date.  Electronically Signed: Ascencion Dike, PA-C 09/18/2020, 1:22 PM   I spent a total of 20 minutes in face to face in clinical consultation, greater than 50% of which was counseling/coordinating care for port

## 2020-09-22 ENCOUNTER — Encounter: Payer: Self-pay | Admitting: Hematology & Oncology

## 2020-09-22 ENCOUNTER — Ambulatory Visit: Payer: Self-pay | Attending: Internal Medicine | Admitting: Internal Medicine

## 2020-09-22 ENCOUNTER — Other Ambulatory Visit: Payer: Self-pay

## 2020-09-22 ENCOUNTER — Encounter: Payer: Self-pay | Admitting: Internal Medicine

## 2020-09-22 DIAGNOSIS — I152 Hypertension secondary to endocrine disorders: Secondary | ICD-10-CM

## 2020-09-22 DIAGNOSIS — Z794 Long term (current) use of insulin: Secondary | ICD-10-CM

## 2020-09-22 DIAGNOSIS — B182 Chronic viral hepatitis C: Secondary | ICD-10-CM | POA: Insufficient documentation

## 2020-09-22 DIAGNOSIS — E1159 Type 2 diabetes mellitus with other circulatory complications: Secondary | ICD-10-CM

## 2020-09-22 DIAGNOSIS — E119 Type 2 diabetes mellitus without complications: Secondary | ICD-10-CM

## 2020-09-22 DIAGNOSIS — C220 Liver cell carcinoma: Secondary | ICD-10-CM

## 2020-09-22 NOTE — Addendum Note (Signed)
Addended bySteffanie Dunn on: 09/22/2020 01:35 PM   Modules accepted: Orders

## 2020-09-22 NOTE — Progress Notes (Signed)
Patient ID: [name] [name], female   DOB: December 27, 1945, 75 y.o.   MRN: [id number] Virtual Visit via Telephone Note  I connected with [name] [name] on 09/22/2020 at 9:06 AM by telephone and verified that I am speaking with the correct person using two identifiers  Location: Patient: home Provider: office  Participants: Myself Patient and her daughter, Yehuda Budd   I discussed the limitations, risks, security and privacy concerns of performing an evaluation and management service by telephone and the availability of in person appointments. I also discussed with the patient that there may be a patient responsible charge related to this service. The patient expressed understanding and agreed to proceed.   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  Purpose of today's visit is to est care.  She has seen our NP 09/10/2020 post hosp.  HTN: Patient on Cozaar 25 mg twice a day, HCTZ and nifedipine 30 mg twice a day.  Reports compliance with medications She checks blood pressure almost daily.  Blood pressure this morning before medications was 143/79.  Most days however systolic blood pressure ranges from 117-127. -HCTZ makes her urinate a lot. -I note that on her progress to recent chemistries, calcium elevated at 10.6.  TSH was also mildly elevated at 4.7 with total T4 elevated at 15.  DM: Daughter states that she needs insulin because she was given a chemotherapeutic agent in 2005 that adversely affected periods. Last A1c was 5.52-monthago.  Checks blood sugars in a.m. breakfast evenings before dinner.  Range before breakfast 85-120.  Range before dinner 120-140  She saw the surgeon Dr. GJohney Mainein follow-up last week.  He did the surgery for perforated gastric ulcer that she  experienced in June of this year.  He increase pantoprazole to 40 mg twice a day and told her to stay on this dose until she sees the gastroenterologist.  Hepatocellular carcinoma/hepatitis C: She saw [facility], Dr. BRosalie Gumslast week.  They are requesting to see a different gastroenterologist because she did not have a good experience with him.  Daughter states they were not told that they needed to have $100 to be seen until they got there.  Daughter and patient were offended because they felt he insinuated that she must of been a drug user to have acquired hepatitis C.  Patient is a retired nMarine scientist  She reports receiving blood transfusion 2004.  The gastroenterologist told her that she would need EGD and colonoscopy.  Endoscopy is scheduled for later this month.  They were told that they will need $500 upfront and then pay the remainder in monthly installments.  They cannot afford this.  Daughter plans to apply for the orange card/cone discount card for the patient.  However they would like [facility]   -She is followed by oncologist Dr. PBurney Gauze  She has received 1 dose of immunotherapy already 10 days ago.  She had a port placed last week for on therapy.  Outpatient Encounter Medications as of 09/22/2020  Medication Sig Note   acetaminophen (TYLENOL) 325 MG tablet Take 2 tablets (650 mg total) by mouth every 6 (six) hours as needed for mild pain, moderate pain or fever.    brimonidine (ALPHAGAN P) 0.1 % SOLN Place 1 drop into both eyes in the morning  and at bedtime.    diphenhydramine-acetaminophen (TYLENOL PM) 25-500 MG TABS tablet Take 1 tablet by mouth at bedtime as needed (sleep).    hydrochlorothiazide (HYDRODIURIL) 25 MG tablet Take 1 tablet (25 mg total) by mouth daily.    insulin NPH-regular Human (NOVOLIN 70/30) (70-30) 100 UNIT/ML injection Inject 15 Units into the skin 2 (two) times daily with a meal. 09/18/2020: Disregard 20 UNIT today    losartan (COZAAR) 25 MG tablet Take 2 tablets (50 mg total) by mouth at bedtime.    NIFEdipine (ADALAT CC) 30 MG 24 hr tablet Take 1 tablet (30 mg total) by mouth in the morning and at bedtime.    ondansetron (ZOFRAN ODT) 4 MG disintegrating tablet Take 1 tablet (4 mg total) by mouth every 8 (eight) hours as needed for nausea or vomiting. (Patient not taking: No sig reported)    oxyCODONE (OXY IR/ROXICODONE) 5 MG immediate release tablet Take 1-2 tablets (5-10 mg total) by mouth every 6 (six) hours as needed for moderate pain or severe pain. (Patient not taking: No sig reported)    pantoprazole (PROTONIX) 40 MG tablet Take 1 tablet (40 mg total) by mouth daily. (Patient not taking: No sig reported)    polyethylene glycol (MIRALAX / GLYCOLAX) 17 g packet Take 17 g by mouth daily as needed for moderate constipation.    PRESCRIPTION MEDICATION Inject 20 Units as directed in the morning and at bedtime. Rx from [place] 08/09/2020: Insulin Mixtard   senna (SENOKOT) 8.6 MG tablet Take 2 tablets by mouth daily.    No facility-administered encounter medications on file as of 09/22/2020.      Observations/Objective: No direct observation done as this was a telephone encounter  Lab Results  Component Value Date   WBC 5.7 09/11/2020   HGB 11.3 (L) 09/11/2020   HCT 35.6 (L) 09/11/2020   MCV 92.5 09/11/2020   PLT 315 09/11/2020     Chemistry      Component Value Date/Time   NA 133 (L) 09/11/2020 1043   K 4.6 09/11/2020 1043   CL 102 09/11/2020 1043   CO2 26 09/11/2020 1043   BUN 18 09/11/2020 1043   CREATININE 0.68 09/11/2020 1043      Component Value Date/Time   CALCIUM 10.6 (H) 09/11/2020 1043   ALKPHOS 211 (H) 09/11/2020 1043   AST 42 (H) 09/11/2020 1043   ALT 38 09/11/2020 1043   BILITOT 0.7 09/11/2020 1043     Lab Results  Component Value Date   TSH 4.707 (H) 09/11/2020    Assessment and Plan: 1. Controlled type 2 diabetes mellitus without complication, with long-term current  use of insulin ([place]) Diabetes under good control on current dose of insulin.  No changes made in insulin dose.  2. Hypertension associated with diabetes ([place]) Home blood pressure readings have been at goal except elevated this morning before she took her medication.  I will have her continue HCTZ, nifedipine and Cozaar.  Advised that the HCTZ will cause frequent urination.  Encouraged her to stay hydrated.  Chemistry is checked regularly through her oncologist.  3. Hypercalcemia Possible causes include HCTZ and cancer diagnosis.  We will do further work-up to rule out hyperparathyroidism, too much vitamin D or thyroid disease.  I will leave it up to her oncologist to determine whether SPEP needs to be done - PTH, Intact and Calcium; Future - VITAMIN D 25 Hydroxy (Vit-D Deficiency, Fractures); Future - TSH+T4F+T3Free; Future  4. Chronic hepatitis C with hepatic coma [facility]) Oncologist  recommends that she be referred to have treatment addressed given the [facility] - Ambulatory referral to Infectious Disease  5. Hepatocellular carcinoma ([place]) She has started treatment already with the oncologist. Advised patient and daughter how to apply for the orange card/cone discount card.  Once she has been approved, they will let me know and then I will resubmit referral to the gastroenterologist at [facility] since she does not want to go back to Dr. Eudelia Bunch  Follow Up Instructions: 3 mths   I discussed the assessment and treatment plan with the patient. The patient was provided an opportunity to ask questions and all were answered. The patient agreed with the plan and demonstrated an understanding of the instructions.   The patient was advised to call back or seek an in-person evaluation if the symptoms worsen or if the condition fails to improve as anticipated.  I  Spent 29 minutes on this telephone encounter  This note has been created with Surveyor, quantity.  Any transcriptional errors are unintentional.   Karle Plumber, MD

## 2020-09-23 LAB — PTH, INTACT AND CALCIUM
Calcium: 10.4 mg/dL — ABNORMAL HIGH (ref 8.7–10.3)
PTH: 24 pg/mL (ref 15–65)

## 2020-09-23 LAB — TSH+T4F+T3FREE
Free T4: 1.41 ng/dL (ref 0.82–1.77)
T3, Free: 2.9 pg/mL (ref 2.0–4.4)
TSH: 5.54 u[IU]/mL — ABNORMAL HIGH (ref 0.450–4.500)

## 2020-09-23 LAB — VITAMIN D 25 HYDROXY (VIT D DEFICIENCY, FRACTURES): Vit D, 25-Hydroxy: 37.3 ng/mL (ref 30.0–100.0)

## 2020-09-24 NOTE — Telephone Encounter (Signed)
Please advise if refill is appropriate 

## 2020-09-25 ENCOUNTER — Other Ambulatory Visit: Payer: Self-pay

## 2020-09-25 ENCOUNTER — Encounter: Payer: Self-pay | Admitting: Hematology & Oncology

## 2020-09-25 MED ORDER — NIFEDIPINE ER OSMOTIC RELEASE 30 MG PO TB24
30.0000 mg | ORAL_TABLET | Freq: Two times a day (BID) | ORAL | 1 refills | Status: DC
  Filled 2020-09-25 – 2020-10-15 (×2): qty 60, 30d supply, fill #0

## 2020-09-25 MED ORDER — PANTOPRAZOLE SODIUM 40 MG PO TBEC
40.0000 mg | DELAYED_RELEASE_TABLET | Freq: Every day | ORAL | 1 refills | Status: DC
  Filled 2020-09-25 – 2020-10-15 (×2): qty 30, 30d supply, fill #0
  Filled 2020-10-30: qty 30, 30d supply, fill #1

## 2020-09-25 NOTE — Telephone Encounter (Signed)
She does have refills of both medications which were sent to [facility], [place], [place]. I have sent a refill for CHW to the pharmacy as requested.

## 2020-09-25 NOTE — Telephone Encounter (Signed)
Patients daughter is aware

## 2020-10-01 ENCOUNTER — Encounter: Payer: Self-pay | Admitting: Family

## 2020-10-01 ENCOUNTER — Other Ambulatory Visit (HOSPITAL_COMMUNITY): Payer: Self-pay

## 2020-10-01 ENCOUNTER — Telehealth: Payer: Self-pay

## 2020-10-01 ENCOUNTER — Ambulatory Visit (INDEPENDENT_AMBULATORY_CARE_PROVIDER_SITE_OTHER): Payer: Self-pay | Admitting: Family

## 2020-10-01 ENCOUNTER — Other Ambulatory Visit: Payer: Self-pay

## 2020-10-01 VITALS — BP 113/66 | HR 90 | Temp 98.8°F | Wt 145.0 lb

## 2020-10-01 DIAGNOSIS — B182 Chronic viral hepatitis C: Secondary | ICD-10-CM

## 2020-10-01 DIAGNOSIS — C220 Liver cell carcinoma: Secondary | ICD-10-CM

## 2020-10-01 NOTE — Patient Instructions (Addendum)
Nice to meet you.   We will obtain your lab work results from the Gastroenterologist.  We will hold off on treatment for your liver cancer for now as treatment at this point decreases the efficacy of the medication and may also worsen your cancer.   We will coordinate treatment with Dr. Marin Olp.  Plan for follow up pending cancer treatment.  Please feel free to ask any questions.  Have a great day and stay safe!  Limit acetaminophen (Tylenol) usage to no more than 2 grams (2,000 mg) per day.  Avoid alcohol.  Do not share toothbrushes or razors.  Practice safe sex to protect against transmission as well as sexually transmitted disease.    Hepatitis C Hepatitis C is a viral infection of the liver. It can lead to scarring of the liver (cirrhosis), liver failure, or liver cancer. Hepatitis C may go undetected for months or years because people with the infection may not have symptoms, or they may have only mild symptoms. What are the causes? This condition is caused by the hepatitis C virus (HCV). The virus can spread from person to person (is contagious) through: Blood. Childbirth. A woman who has hepatitis C can pass it to her baby during birth. Bodily fluids, such as breast milk, tears, semen, vaginal fluids, and saliva. Blood transfusions or organ transplants done in the [place] before 1992.  What increases the risk? The following factors may make you more likely to develop this condition: Having contact with unclean (contaminated) needles or syringes. This may result from: Acupuncture. Tattoing. Body piercing. Injecting drugs. Having unprotected sex with someone who is infected. Needing treatment to filter your blood (kidney dialysis). Having HIV (human immunodeficiency virus) or AIDS (acquired immunodeficiency syndrome). Working in a job that involves contact with blood or bodily fluids, such as health care.  What are the signs or symptoms? Symptoms of this  condition include: Fatigue. Loss of appetite. Nausea. Vomiting. Abdominal pain. Dark yellow urine. Yellowish skin and eyes (jaundice). Itchy skin. Clay-colored bowel movements. Joint pain. Bleeding and bruising easily. Fluid building up in your stomach (ascites).  In some cases, you may not have any symptoms. How is this diagnosed? This condition is diagnosed with: Blood tests. Other tests to check how well your liver is functioning. They may include: Magnetic resonance elastography (MRE). This imaging test uses MRIs and sound waves to measure liver stiffness. Transient elastography. This imaging test uses ultrasounds to measure liver stiffness. Liver biopsy. This test requires taking a small tissue sample from your liver to examine it under a microscope.  How is this treated? Your health care provider may perform noninvasive tests or a liver biopsy to help decide the best course of treatment. Treatment may include: Antiviral medicines and other medicines. Follow-up treatments every 6-12 months for infections or other liver conditions. Receiving a donated liver (liver transplant).  Follow these instructions at home: Medicines Take over-the-counter and prescription medicines only as told by your health care provider. Take your antiviral medicine as told by your health care provider. Do not stop taking the antiviral even if you start to feel better. Do not take any medicines unless approved by your health care provider, including over-the-counter medicines and birth control pills. Activity Rest as needed. Do not have sex unless approved by your health care provider. Ask your health care provider when you may return to school or work. Eating and drinking Eat a balanced diet with plenty of fruits and vegetables, whole grains, and lowfat (lean) meats  or non-meat proteins (such as beans or tofu). Drink enough fluids to keep your urine clear or pale yellow. Do not drink  alcohol. General instructions Do not share toothbrushes, nail clippers, or razors. Wash your hands frequently with soap and water. If soap and water are not available, use hand sanitizer. Cover any cuts or open sores on your skin to prevent spreading the virus. Keep all follow-up visits as told by your health care provider. This is important. You may need follow-up visits every 6-12 months. How is this prevented? There is no vaccine for hepatitis C. The only way to prevent the disease is to reduce the risk of exposure to the virus. Make sure you: Wash your hands frequently with soap and water. If soap and water are not available, use hand sanitizer. Do not share needles or syringes. Practice safe sex and use condoms. Avoid handling blood or bodily fluids without gloves or other protection. Avoid getting tattoos or piercings in shops or other locations that are not clean.  Contact a health care provider if: You have a fever. You develop abdominal pain. You pass dark urine. You pass clay-colored stools. You develop joint pain. Get help right away if: You have increasing fatigue or weakness. You lose your appetite. You cannot eat or drink without vomiting. You develop jaundice or your jaundice gets worse. You bruise or bleed easily. Summary Hepatitis C is a viral infection of the liver. It can lead to scarring of the liver (cirrhosis), liver failure, or liver cancer. The hepatitis C virus (HCV) causes this condition. The virus can pass from person to person (is contagious). You should not take any medicines unless approved by your health care provider. This includes over-the-counter medicines and birth control pills. This information is not intended to replace advice given to you by your health care provider. Make sure you discuss any questions you have with your health care provider. Document Released: 01/30/2000 Document Revised: 03/09/2016 Document Reviewed: 03/09/2016 Elsevier  Interactive Patient Education  [employer].

## 2020-10-01 NOTE — Telephone Encounter (Signed)
[facility] Patient Advocate Encounter ? ?Insurance verification completed.   ? ?The patient is uninsured and will need patient assistance for medication. ? ?We can complete the application and will need to meet with the patient for signatures and income documentation. ? ?Kyrell Ruacho, CPhT ?Specialty Pharmacy Patient Advocate ?[facility] ?Phone: [phone] ?Fax:  [phone]  ?

## 2020-10-01 NOTE — Progress Notes (Signed)
Subjective:    Patient ID: [name] [name], female    DOB: Jul 05, 1945, 75 y.o.   MRN: [id number]  Chief Complaint  Patient presents with   New Patient (Initial Visit)    Hep C - pt reports having a fever of 100 x 2 days. Temp this AM was normal.     HPI:  [name] [name] is a 75 y.o. female with previous medical history of colon cancer (s/p treatment and surgical treatment in [place] in 2004), Diabetes, hypertension, and recent perforated ulcer repair presenting today for evaluation of Hepatitis C.  [name] [name] was recently hospitalized for perforated ulcer undergoing repair on 08/09/20 and return to OR on 09/02/20 . Subsequently noted to have a liver mass and underwent biopsy with pathology report showing hepatocellular carcinoma. MRI liver from 08/15/20 with numerous masses in the left hepatic lobe measuring up to 7.2 cm compatible with multifocal hepatocellular carcinoma. Has been seen by Dr. Marin Olp with plans for immunotherapy. Has had a Port-a-cath placed and started on her first dose of treatment.  [name] [name] is from [place] and was visiting her daughter when all of this started. Was first diagnosed with Hepatitis C during her most recent hospitalization. Seen at [facility] initially who drew some lab work. Risk factors for Hepatitis C include blood transfusions and she worked as a Marine scientist as well. She had previous history of colon cancer but no personal or family history of liver disease prior to this. Currently with right sided abdominal pain at times and also with over the past few days following her first immunotherapy treatment. She is present today with her daughter who provides some of the historical information.    Allergies  Allergen Reactions   Amoxicillin     09/01/2020 Daughter is unsure if patient is allergic to Amoxicillin or Clarithromycin. Caused rash and swelling.   Bactrim [Sulfamethoxazole-Trimethoprim] Swelling   Clarithromycin      09/01/2020 Daughter is unsure if Clarithromycin or Amoxicillin caused rash and swelling.      Outpatient Medications Prior to Visit  Medication Sig Dispense Refill   acetaminophen (TYLENOL) 325 MG tablet Take 2 tablets (650 mg total) by mouth every 6 (six) hours as needed for mild pain, moderate pain or fever. 30 tablet 0   brimonidine (ALPHAGAN P) 0.1 % SOLN Place 1 drop into both eyes in the morning and at bedtime.     diphenhydramine-acetaminophen (TYLENOL PM) 25-500 MG TABS tablet Take 1 tablet by mouth at bedtime as needed (sleep).     hydrochlorothiazide (HYDRODIURIL) 25 MG tablet Take 1 tablet (25 mg total) by mouth daily. 90 tablet 1   insulin NPH-regular Human (NOVOLIN 70/30) (70-30) 100 UNIT/ML injection Inject 15 Units into the skin 2 (two) times daily with a meal. 10 mL 3   losartan (COZAAR) 25 MG tablet Take 2 tablets (50 mg total) by mouth at bedtime. 90 tablet 1   NIFEdipine (PROCARDIA-XL/NIFEDICAL-XL) 30 MG 24 hr tablet Take 1 tablet (30 mg total) by mouth in the morning and at bedtime. 30 tablet 1   pantoprazole (PROTONIX) 40 MG tablet Take 1 tablet (40 mg total) by mouth daily. 30 tablet 1   polyethylene glycol (MIRALAX / GLYCOLAX) 17 g packet Take 17 g by mouth daily as needed for moderate constipation.     PRESCRIPTION MEDICATION Inject 20 Units as directed in the morning and at bedtime. Rx from [place]     senna (SENOKOT) 8.6 MG tablet Take 2 tablets by mouth daily.  ondansetron (ZOFRAN ODT) 4 MG disintegrating tablet Take 1 tablet (4 mg total) by mouth every 8 (eight) hours as needed for nausea or vomiting. (Patient not taking: No sig reported) 20 tablet 0   oxyCODONE (OXY IR/ROXICODONE) 5 MG immediate release tablet Take 1-2 tablets (5-10 mg total) by mouth every 6 (six) hours as needed for moderate pain or severe pain. (Patient not taking: No sig reported) 20 tablet 0   No facility-administered medications prior to visit.     Past Medical History:  Diagnosis Date    Colon cancer ([place])    Diabetes mellitus without complication ([place])    Glaucoma    Goals of care, counseling/discussion 09/01/2020   Hepatocellular carcinoma ([place])    Hypertension       Past Surgical History:  Procedure Laterality Date   COLON RESECTION  2005   In [place] - ?colon cancer   IR IMAGING GUIDED PORT INSERTION  09/18/2020   LAPAROSCOPIC GASTRIC RESECTION N/A 08/09/2020   Procedure: LAPAROSCOPIC EXPLORATION OF ABDOMEN, PERFORATED ULCER REPAIR, LIVER BIOPSY;  Surgeon: Michael Boston, MD;  Location: [facility];  Service: General;  Laterality: N/A;      No family history on file.    Social History   Socioeconomic History   Marital status: Widowed    Spouse name: Not on file   Number of children: 5   Years of education: 14   Highest education level: Associate degree: occupational, Hotel manager, or vocational program  Occupational History   Not on file  Tobacco Use   Smoking status: Never   Smokeless tobacco: Never  Vaping Use   Vaping Use: Never used  Substance and Sexual Activity   Alcohol use: Not Currently   Drug use: Never   Sexual activity: Not Currently  Other Topics Concern   Not on file  Social History Narrative   Originally from [place].  Speaks [place] and [place]   Social Determinants of Radio broadcast assistant Strain: Not on [employer] Insecurity: Not on file  Transportation Needs: Not on file  Physical Activity: Not on file  Stress: Not on file  Social Connections: Not on file  Intimate Partner Violence: Not on file      Review of Systems  Constitutional:  Positive for fever. Negative for chills, fatigue and unexpected weight change.  Respiratory:  Negative for cough, chest tightness, shortness of breath and wheezing.   Cardiovascular:  Negative for chest pain and leg swelling.  Gastrointestinal:  Positive for abdominal pain. Negative for constipation, diarrhea, nausea and vomiting.  Neurological:  Negative for dizziness, weakness,  light-headedness and headaches.  Hematological:  Does not bruise/bleed easily.      Objective:    BP 113/66   Pulse 90   Temp 98.8 F (37.1 C) (Oral)   Wt 145 lb (65.8 kg)   SpO2 100%   BMI 24.89 kg/m  Nursing note and vital signs reviewed.  Physical Exam Constitutional:      General: She is not in acute distress.    Appearance: She is well-developed.  Cardiovascular:     Rate and Rhythm: Normal rate and regular rhythm.     Heart sounds: Normal heart sounds. No murmur heard.   No friction rub. No [name].  Pulmonary:     Effort: Pulmonary effort is normal. No respiratory distress.     Breath sounds: Normal breath sounds. No wheezing or rales.  Chest:     Chest wall: No tenderness.  Abdominal:     General:  Bowel sounds are normal. There is no distension.     Palpations: Abdomen is soft. There is no mass.     Tenderness: There is no abdominal tenderness. There is no guarding or rebound.  Skin:    General: Skin is warm and dry.  Neurological:     Mental Status: She is alert and oriented to person, place, and time.  Psychiatric:        Behavior: Behavior normal.        Thought Content: Thought content normal.        Judgment: Judgment normal.        Assessment & Plan:   Patient Active Problem List   Diagnosis Date Noted   Hypercalcemia 09/22/2020   Chronic hepatitis C with hepatic coma ([place]) 09/22/2020   Goals of care, counseling/discussion 09/01/2020   Hepatocellular carcinoma ([place]) 08/14/2020   Hyperglycemia 08/10/2020   Pneumoperitoneum 08/09/2020   History of colorectal cancer 08/09/2020   Liver mass, left lobe 08/09/2020   Insulin-requiring or dependent type II diabetes mellitus ([place]) 08/09/2020   Hypertension associated with diabetes ([place]) 08/09/2020   Constipation, chronic 08/09/2020   Perforated gastric ulcer s/p lap omental [place] patch 08/09/2020 08/09/2020     Problem List Items Addressed This Visit       Digestive   Hepatocellular carcinoma  ([place])   Chronic hepatitis C with hepatic coma ([place]) - Primary    [name] [name] is a 75 y/o AA female from [place] with chronic Hepatitis C complicated by development of hepatocellular carcinoma. Initial lab work with RNA level of 371,000. Genotype performed at Gastroenterology and will obtain those records. We reviewed the transmission, complications and treatment options for Hepatitis C. Discussed that DAA's that normally would have 94% efficacy have reduced efficacy in the setting of active [place] down to 50% in some cases. Current research shows that active [place] increases the risk of treatment failure for Hepatitis C by 8.5x. This increases the risk of virologic resistance as well. Agree that treatment for Hepatitis C is warranted, however timing of treatment should ideally occur once [facility] is adequately controlled. I spoke with Dr. Linus Salmons regarding this and he is in agreement with this treatment plan. Will obtain genotype and coordinate appropriate timing with Oncology.         I am having [name] [name] maintain her brimonidine, PRESCRIPTION MEDICATION, diphenhydramine-acetaminophen, polyethylene glycol, ondansetron, acetaminophen, oxyCODONE, senna, losartan, hydrochlorothiazide, NovoLIN 70/30, NIFEdipine, and pantoprazole.   Follow-up: Pending obtaining lab work and treatment for [facility].     Terri Piedra, MSN, FNP-C Nurse Practitioner [facility] [place] number: [phone]

## 2020-10-02 ENCOUNTER — Encounter: Payer: Self-pay | Admitting: Family

## 2020-10-02 ENCOUNTER — Inpatient Hospital Stay: Payer: Self-pay

## 2020-10-02 ENCOUNTER — Inpatient Hospital Stay: Payer: Self-pay | Attending: Hematology & Oncology

## 2020-10-02 ENCOUNTER — Encounter: Payer: Self-pay | Admitting: Hematology & Oncology

## 2020-10-02 ENCOUNTER — Telehealth: Payer: Self-pay | Admitting: *Deleted

## 2020-10-02 ENCOUNTER — Other Ambulatory Visit: Payer: Self-pay

## 2020-10-02 ENCOUNTER — Encounter: Payer: Self-pay | Admitting: *Deleted

## 2020-10-02 ENCOUNTER — Inpatient Hospital Stay (HOSPITAL_BASED_OUTPATIENT_CLINIC_OR_DEPARTMENT_OTHER): Payer: Self-pay | Admitting: Hematology & Oncology

## 2020-10-02 VITALS — BP 128/50 | HR 89 | Temp 98.2°F | Resp 17 | Wt 142.0 lb

## 2020-10-02 DIAGNOSIS — Z79899 Other long term (current) drug therapy: Secondary | ICD-10-CM | POA: Insufficient documentation

## 2020-10-02 DIAGNOSIS — B1920 Unspecified viral hepatitis C without hepatic coma: Secondary | ICD-10-CM | POA: Insufficient documentation

## 2020-10-02 DIAGNOSIS — R7989 Other specified abnormal findings of blood chemistry: Secondary | ICD-10-CM | POA: Insufficient documentation

## 2020-10-02 DIAGNOSIS — C220 Liver cell carcinoma: Secondary | ICD-10-CM

## 2020-10-02 DIAGNOSIS — E119 Type 2 diabetes mellitus without complications: Secondary | ICD-10-CM | POA: Insufficient documentation

## 2020-10-02 DIAGNOSIS — G893 Neoplasm related pain (acute) (chronic): Secondary | ICD-10-CM | POA: Insufficient documentation

## 2020-10-02 DIAGNOSIS — Z794 Long term (current) use of insulin: Secondary | ICD-10-CM | POA: Insufficient documentation

## 2020-10-02 LAB — IRON AND TIBC
Iron: 31 ug/dL — ABNORMAL LOW (ref 41–142)
Saturation Ratios: 13 % — ABNORMAL LOW (ref 21–57)
TIBC: 241 ug/dL (ref 236–444)
UIBC: 211 ug/dL (ref 120–384)

## 2020-10-02 LAB — CMP (CANCER CENTER ONLY)
ALT: 229 U/L — ABNORMAL HIGH (ref 0–44)
AST: 268 U/L (ref 15–41)
Albumin: 3.3 g/dL — ABNORMAL LOW (ref 3.5–5.0)
Alkaline Phosphatase: 436 U/L — ABNORMAL HIGH (ref 38–126)
Anion gap: 6 (ref 5–15)
BUN: 18 mg/dL (ref 8–23)
CO2: 26 mmol/L (ref 22–32)
Calcium: 10 mg/dL (ref 8.9–10.3)
Chloride: 100 mmol/L (ref 98–111)
Creatinine: 0.67 mg/dL (ref 0.44–1.00)
GFR, Estimated: >60 mL/min (ref >60)
Glucose, Bld: 134 mg/dL — ABNORMAL HIGH (ref 70–99)
Potassium: 3.6 mmol/L (ref 3.5–5.1)
Sodium: 132 mmol/L — ABNORMAL LOW (ref 135–145)
Total Bilirubin: 1.1 mg/dL (ref 0.3–1.2)
Total Protein: 8 g/dL (ref 6.5–8.1)

## 2020-10-02 LAB — CBC WITH DIFFERENTIAL (CANCER CENTER ONLY)
Abs Immature Granulocytes: 0.01 10*3/uL (ref 0.00–0.07)
Basophils Absolute: 0 10*3/uL (ref 0.0–0.1)
Basophils Relative: 1 %
Eosinophils Absolute: 0.3 10*3/uL (ref 0.0–0.5)
Eosinophils Relative: 7 %
HCT: 32.8 % — ABNORMAL LOW (ref 36.0–46.0)
Hemoglobin: 11 g/dL — ABNORMAL LOW (ref 12.0–15.0)
Immature Granulocytes: 0 %
Lymphocytes Relative: 32 %
Lymphs Abs: 1.4 10*3/uL (ref 0.7–4.0)
MCH: 28.6 pg (ref 26.0–34.0)
MCHC: 33.5 g/dL (ref 30.0–36.0)
MCV: 85.2 fL (ref 80.0–100.0)
Monocytes Absolute: 0.4 10*3/uL (ref 0.1–1.0)
Monocytes Relative: 8 %
Neutro Abs: 2.2 10*3/uL (ref 1.7–7.7)
Neutrophils Relative %: 52 %
Platelet Count: 181 10*3/uL (ref 150–400)
RBC: 3.85 MIL/uL — ABNORMAL LOW (ref 3.87–5.11)
RDW: 15.5 % (ref 11.5–15.5)
WBC Count: 4.3 10*3/uL (ref 4.0–10.5)
nRBC: 0 % (ref 0.0–0.2)

## 2020-10-02 LAB — TSH: TSH: 4.146 u[IU]/mL — ABNORMAL HIGH (ref 0.308–3.960)

## 2020-10-02 LAB — FERRITIN: Ferritin: 603 ng/mL — ABNORMAL HIGH (ref 11–307)

## 2020-10-02 LAB — LACTATE DEHYDROGENASE: LDH: 237 U/L — ABNORMAL HIGH (ref 98–192)

## 2020-10-02 MED ORDER — HEPARIN SOD (PORK) LOCK FLUSH 100 UNIT/ML IV SOLN
500.0000 [IU] | Freq: Once | INTRAVENOUS | Status: AC
  Administered 2020-10-02: 500 [IU] via INTRAVENOUS

## 2020-10-02 MED ORDER — SODIUM CHLORIDE 0.9% FLUSH
10.0000 mL | Freq: Once | INTRAVENOUS | Status: AC
  Administered 2020-10-02: 10 mL via INTRAVENOUS

## 2020-10-02 NOTE — Telephone Encounter (Signed)
Dr. Ennever notified of AST-[id number] and Alk [id number]. No treatment today for pt per order of Dr. Ennever.  

## 2020-10-02 NOTE — Progress Notes (Signed)
Hematology and Oncology Follow Up Visit  [name] [name] [id number] 1945/06/30 75 y.o. 10/02/2020   Principle Diagnosis:  Hepatocellular carcinoma-multifocal Hepatitis C  Current Therapy:   Status post cycle 1 of nivolumab/ipilimumab     Interim History:  [name] is back for follow-up.  This was second office visit.  She has been on immunotherapy for her hepatocellular carcinoma.  We do not use Avastin because of the recent history of gastric perforation.  She required surgery for this.  She has seen Infectious Disease.  She has not yet started any antiviral for the Hepatitis C.  She was told that this would interfere with the immunotherapy.  I certainly am not aware of this.  I will have to talk to our pharmacist about this.  Her liver function studies are quite high.  Because of this, we really cannot use immunotherapy today.  She feels okay.  She has little pain in the epigastric area.  There is no diarrhea.  She has had no rashes.  She has had a little fever which she takes Tylenol for.  I thought that this would be a tough secondary to the elevated liver function studies.  However, she cannot take nonsteroidals because of the gastric perforation.  She has had no cough.  She says she has cough little bit when she yawns.  There is no bleeding.  She has had no leg swelling.  She has had no mouth sores.  Currently, I would say her performance status is probably ECOG 1.  Medications:  Current Outpatient Medications:    acetaminophen (TYLENOL) 325 MG tablet, Take 2 tablets (650 mg total) by mouth every 6 (six) hours as needed for mild pain, moderate pain or fever., Disp: 30 tablet, Rfl: 0   brimonidine (ALPHAGAN P) 0.1 % SOLN, Place 1 drop into both eyes in the morning and at bedtime., Disp: , Rfl:    brimonidine (ALPHAGAN) 0.2 % ophthalmic solution, 1 drop 2 (two) times daily., Disp: , Rfl:    diphenhydramine-acetaminophen (TYLENOL PM) 25-500 MG TABS  tablet, Take 1 tablet by mouth at bedtime as needed (sleep)., Disp: , Rfl:    hydrochlorothiazide (HYDRODIURIL) 25 MG tablet, Take 1 tablet (25 mg total) by mouth daily., Disp: 90 tablet, Rfl: 1   insulin NPH-regular Human (NOVOLIN 70/30) (70-30) 100 UNIT/ML injection, Inject 15 Units into the skin 2 (two) times daily with a meal., Disp: 10 mL, Rfl: 3   losartan (COZAAR) 25 MG tablet, Take 2 tablets (50 mg total) by mouth at bedtime., Disp: 90 tablet, Rfl: 1   NIFEdipine (PROCARDIA-XL/NIFEDICAL-XL) 30 MG 24 hr tablet, Take 1 tablet (30 mg total) by mouth in the morning and at bedtime., Disp: 30 tablet, Rfl: 1   ondansetron (ZOFRAN ODT) 4 MG disintegrating tablet, Take 1 tablet (4 mg total) by mouth every 8 (eight) hours as needed for nausea or vomiting. (Patient not taking: No sig reported), Disp: 20 tablet, Rfl: 0   oxyCODONE (OXY IR/ROXICODONE) 5 MG immediate release tablet, Take 1-2 tablets (5-10 mg total) by mouth every 6 (six) hours as needed for moderate pain or severe pain. (Patient not taking: No sig reported), Disp: 20 tablet, Rfl: 0   pantoprazole (PROTONIX) 40 MG tablet, Take 1 tablet (40 mg total) by mouth daily., Disp: 30 tablet, Rfl: 1   polyethylene glycol (MIRALAX / GLYCOLAX) 17 g packet, Take 17 g by mouth daily as needed for moderate constipation., Disp: , Rfl:    PRESCRIPTION MEDICATION, Inject 20 Units  as directed in the morning and at bedtime. Rx from [place], [place]: , Rfl:    senna (SENOKOT) 8.6 MG tablet, Take 2 tablets by mouth daily., Disp: , Rfl:   Allergies:  Allergies  Allergen Reactions   Amoxicillin     09/01/2020 Daughter is unsure if patient is allergic to Amoxicillin or Clarithromycin. Caused rash and swelling.   Bactrim [Sulfamethoxazole-Trimethoprim] Swelling   Clarithromycin     09/01/2020 Daughter is unsure if Clarithromycin or Amoxicillin caused rash and swelling.    Past Medical History, Surgical history, Social history, and Family History were reviewed  and updated.  Review of Systems: Review of Systems  Constitutional: Negative.   HENT:  Negative.    Eyes: Negative.   Respiratory:  Positive for cough.   Cardiovascular: Negative.   Gastrointestinal:  Positive for abdominal pain.  Endocrine: Negative.   Genitourinary: Negative.    Musculoskeletal: Negative.   Skin: Negative.   Neurological: Negative.   Hematological: Negative.   Psychiatric/Behavioral: Negative.     Physical Exam:  weight is 142 lb (64.4 kg). Her oral temperature is 98.2 F (36.8 C). Her blood pressure is 128/50 (abnormal) and her pulse is 89. Her respiration is 17 and oxygen saturation is 100%.   Wt Readings from Last 3 Encounters:  10/02/20 142 lb (64.4 kg)  10/01/20 145 lb (65.8 kg)  09/10/20 143 lb 6.4 oz (65 kg)    Physical Exam Vitals reviewed.  HENT:     Head: Normocephalic and atraumatic.  Eyes:     Pupils: Pupils are equal, round, and reactive to light.  Cardiovascular:     Rate and Rhythm: Normal rate and regular rhythm.     Heart sounds: Normal heart sounds.  Pulmonary:     Effort: Pulmonary effort is normal.     Breath sounds: Normal breath sounds.  Abdominal:     General: Bowel sounds are normal.     Palpations: Abdomen is soft.     Comments: Abdominal exam shows the healing laparotomy scar in the midline.  She has little bit of distention about the laparotomy scar.  She has no fluid wave.  Maybe a little bit of tenderness in the right upper quadrant.  Bowel sounds are present.  There is no obvious liver or spleen tip that is palpable.  Musculoskeletal:        General: No tenderness or deformity. Normal range of motion.     Cervical back: Normal range of motion.  Lymphadenopathy:     Cervical: No cervical adenopathy.  Skin:    General: Skin is warm and dry.     Findings: No erythema or rash.  Neurological:     Mental Status: She is alert and oriented to person, place, and time.  Psychiatric:        Behavior: Behavior normal.         Thought Content: Thought content normal.        Judgment: Judgment normal.     Lab Results  Component Value Date   WBC 4.3 10/02/2020   HGB 11.0 (L) 10/02/2020   HCT 32.8 (L) 10/02/2020   MCV 85.2 10/02/2020   PLT 181 10/02/2020     Chemistry      Component Value Date/Time   NA 132 (L) 10/02/2020 0939   K 3.6 10/02/2020 0939   CL 100 10/02/2020 0939   CO2 26 10/02/2020 0939   BUN 18 10/02/2020 0939   CREATININE 0.67 10/02/2020 0939      Component Value  Date/Time   CALCIUM 10.0 10/02/2020 0939   ALKPHOS 436 (H) 10/02/2020 0939   AST 268 (HH) 10/02/2020 0939   ALT 229 (H) 10/02/2020 0939   BILITOT 1.1 10/02/2020 0939       Impression and Plan: [name] is a very charming 75 year old woman from [place].  She has hepatocellular carcinoma.  She also has Hepatitis C.  We just are not able to treat in the liver cancer today because of the elevated LFTs.  I had to suspect this is part from her immunotherapy.  I do not think we had to put her on steroids just because the liver function elevation is not too bad.  I am trying to get her to minimize the Tylenol use.  Again, she cannot use nonsteroidals because of a perforated gastric ulcer and surgery for this.  Of note, her last alpha-fetoprotein was 70.4.  We will have to see what the level is today.  I will I have her come back in 2 weeks.  We really have to see how her labs look and then maybe try to rechallenge her with immunotherapy.  Again we are going to check into whether or not antiviral therapy is going to interfere with the immunotherapy.  I would not think that it would.   Volanda Napoleon, MD 8/18/202212:35 PM

## 2020-10-02 NOTE — Progress Notes (Signed)
Visited with patient briefly in infusion prior to her MD visit. She states she is doing well and feels well.   Unfortunately, her liver functions are too high for treatment so she will be delayed 2 weeks.   Oncology Nurse Navigator Documentation  Oncology Nurse Navigator Flowsheets 10/02/2020  Abnormal Finding Date -  Confirmed Diagnosis Date -  Diagnosis Status -  Planned Course of Treatment -  Phase of Treatment -  Chemotherapy Actual Start Date: -  Navigator Follow Up Date: 10/13/2020  Navigator Follow Up Reason: Follow-up Appointment;Chemotherapy  Navigator Location [facility]  Referral Date to RadOnc/MedOnc -  Navigator Encounter Type Treatment;Appt/Treatment Plan Review  Telephone -  Multidisiplinary Clinic Date -  Multidisiplinary Clinic Type -  Treatment Initiated Date -  Patient Visit Type MedOnc  Treatment Phase Active Tx  Barriers/Navigation Needs Coordination of Care;Education  Education -  Interventions Psycho-Social Support  Acuity Level 2-Minimal Needs (1-2 Barriers Identified)  Coordination of Care -  Education Method -  Support Groups/Services Friends and Family  Time Spent with Patient 15

## 2020-10-02 NOTE — Addendum Note (Signed)
Addended by: Lucile Crater on: 10/02/2020 11:16 AM   Modules accepted: Orders

## 2020-10-02 NOTE — Patient Instructions (Signed)

## 2020-10-02 NOTE — Progress Notes (Addendum)
Pt de accessed, no treatment per MD due to counts. Pt will take a few weeks off and possibly be re challenged.

## 2020-10-02 NOTE — Assessment & Plan Note (Signed)
Newly diagnosed with hepatocellular carcinoma and recently started immunotherapy with Oncolocy.

## 2020-10-02 NOTE — Assessment & Plan Note (Signed)
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 Initial lab work with RNA level of 371,000. Genotype performed at Gastroenterology and will obtain those records. We reviewed the transmission, complications and treatment options for Hepatitis C. Discussed that DAA's that normally would have 94% efficacy have reduced efficacy in the setting of active [place] down to 50% in some cases. Current research shows that active [place] increases the risk of treatment failure for Hepatitis C by 8.5x. This increases the risk of virologic resistance as well. Agree that treatment for Hepatitis C is warranted, however timing of treatment should ideally occur once [facility] is adequately controlled. I spoke with Dr. Linus Salmons regarding this and he is in agreement with this treatment plan. Will obtain genotype and coordinate appropriate timing with Oncology.

## 2020-10-03 ENCOUNTER — Other Ambulatory Visit: Payer: Self-pay

## 2020-10-03 ENCOUNTER — Ambulatory Visit: Payer: Self-pay | Attending: Internal Medicine

## 2020-10-03 LAB — AFP TUMOR MARKER: AFP, Serum, Tumor Marker: 16.7 ng/mL — ABNORMAL HIGH (ref 0.0–9.2)

## 2020-10-13 ENCOUNTER — Inpatient Hospital Stay: Payer: Self-pay

## 2020-10-13 ENCOUNTER — Other Ambulatory Visit: Payer: Self-pay

## 2020-10-13 ENCOUNTER — Inpatient Hospital Stay (HOSPITAL_BASED_OUTPATIENT_CLINIC_OR_DEPARTMENT_OTHER): Payer: Self-pay | Admitting: Hematology & Oncology

## 2020-10-13 ENCOUNTER — Encounter: Payer: Self-pay | Admitting: *Deleted

## 2020-10-13 ENCOUNTER — Encounter: Payer: Self-pay | Admitting: Hematology & Oncology

## 2020-10-13 VITALS — BP 115/51 | HR 86 | Temp 97.6°F | Resp 17 | Wt 144.8 lb

## 2020-10-13 DIAGNOSIS — C220 Liver cell carcinoma: Secondary | ICD-10-CM

## 2020-10-13 LAB — CBC WITH DIFFERENTIAL (CANCER CENTER ONLY)
Abs Immature Granulocytes: 0.01 10*3/uL (ref 0.00–0.07)
Basophils Absolute: 0 10*3/uL (ref 0.0–0.1)
Basophils Relative: 1 %
Eosinophils Absolute: 0.2 10*3/uL (ref 0.0–0.5)
Eosinophils Relative: 4 %
HCT: 31 % — ABNORMAL LOW (ref 36.0–46.0)
Hemoglobin: 10.2 g/dL — ABNORMAL LOW (ref 12.0–15.0)
Immature Granulocytes: 0 %
Lymphocytes Relative: 29 %
Lymphs Abs: 1.2 10*3/uL (ref 0.7–4.0)
MCH: 28 pg (ref 26.0–34.0)
MCHC: 32.9 g/dL (ref 30.0–36.0)
MCV: 85.2 fL (ref 80.0–100.0)
Monocytes Absolute: 0.3 10*3/uL (ref 0.1–1.0)
Monocytes Relative: 8 %
Neutro Abs: 2.4 10*3/uL (ref 1.7–7.7)
Neutrophils Relative %: 58 %
Platelet Count: 210 10*3/uL (ref 150–400)
RBC: 3.64 MIL/uL — ABNORMAL LOW (ref 3.87–5.11)
RDW: 17.1 % — ABNORMAL HIGH (ref 11.5–15.5)
WBC Count: 4.1 10*3/uL (ref 4.0–10.5)
nRBC: 0 % (ref 0.0–0.2)

## 2020-10-13 LAB — CMP (CANCER CENTER ONLY)
ALT: 242 U/L — ABNORMAL HIGH (ref 0–44)
AST: 353 U/L (ref 15–41)
Albumin: 3.3 g/dL — ABNORMAL LOW (ref 3.5–5.0)
Alkaline Phosphatase: 433 U/L — ABNORMAL HIGH (ref 38–126)
Anion gap: 5 (ref 5–15)
BUN: 15 mg/dL (ref 8–23)
CO2: 24 mmol/L (ref 22–32)
Calcium: 10.1 mg/dL (ref 8.9–10.3)
Chloride: 102 mmol/L (ref 98–111)
Creatinine: 0.85 mg/dL (ref 0.44–1.00)
GFR, Estimated: >60 mL/min (ref >60)
Glucose, Bld: 283 mg/dL — ABNORMAL HIGH (ref 70–99)
Potassium: 4.7 mmol/L (ref 3.5–5.1)
Sodium: 131 mmol/L — ABNORMAL LOW (ref 135–145)
Total Bilirubin: 1.6 mg/dL — ABNORMAL HIGH (ref 0.3–1.2)
Total Protein: 7.7 g/dL (ref 6.5–8.1)

## 2020-10-13 LAB — LACTATE DEHYDROGENASE: LDH: 254 U/L — ABNORMAL HIGH (ref 98–192)

## 2020-10-13 NOTE — Progress Notes (Signed)
Hematology and Oncology Follow Up Visit  [name] [name] [id number] Apr 12, 1945 75 y.o. 10/13/2020   Principle Diagnosis:  Hepatocellular carcinoma-multifocal Hepatitis C  Current Therapy:   Status post cycle 1 of nivolumab/ipilimumab -- d/c on 10/13/2020 due to hepatic toxicity     Interim History:  [name] is back for follow-up.  Unfortunately, her liver function studies are no better.  Again I have to believe that we are looking at the immunotherapy as a cause for this.  I would not think that this is from the hepatocellular carcinoma.  I also do not think it is from the Hepatitis C.  She is not yet on any treatment for the Hepatitis C.  We just are not familiar to use immunotherapy in my opinion.  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  I think were going to have to use targeted therapy.  We could use Lenvima which is recently approved for hepatocellular carcinoma.  She really is not that symptomatic from the elevated liver function studies.  She has had no nausea or vomiting.  She has had no diarrhea.  There is been no bleeding.  She has had no issues with cough.  She does have diabetes.  She said her blood sugars this morning were quite low.  She had a little more to eat this morning and her blood sugars on the higher side.  She has had no leg swelling.  There is been no fever.  She had a little fever after she left her office last time.  The fever could certainly be from the hepatitis.  Overall, I would have to say her performance status is probably ECOG 1.    Medications:  Current Outpatient Medications:    acetaminophen (TYLENOL) 325 MG tablet, Take 2 tablets (650 mg total) by mouth every 6 (six) hours as needed for mild pain, moderate pain or fever., Disp: 30 tablet, Rfl: 0   brimonidine (ALPHAGAN P) 0.1 % SOLN, Place 1 drop into both eyes in the morning and at bedtime., Disp: , Rfl:    brimonidine (ALPHAGAN) 0.2 % ophthalmic  solution, 1 drop 2 (two) times daily., Disp: , Rfl:    diphenhydramine-acetaminophen (TYLENOL PM) 25-500 MG TABS tablet, Take 1 tablet by mouth at bedtime as needed (sleep)., Disp: , Rfl:    hydrochlorothiazide (HYDRODIURIL) 25 MG tablet, Take 1 tablet (25 mg total) by mouth daily., Disp: 90 tablet, Rfl: 1   insulin NPH-regular Human (NOVOLIN 70/30) (70-30) 100 UNIT/ML injection, Inject 15 Units into the skin 2 (two) times daily with a meal., Disp: 10 mL, Rfl: 3   losartan (COZAAR) 25 MG tablet, Take 2 tablets (50 mg total) by mouth at bedtime., Disp: 90 tablet, Rfl: 1   NIFEdipine (PROCARDIA-XL/NIFEDICAL-XL) 30 MG 24 hr tablet, Take 1 tablet (30 mg total) by mouth in the morning and at bedtime., Disp: 30 tablet, Rfl: 1   ondansetron (ZOFRAN ODT) 4 MG disintegrating tablet, Take 1 tablet (4 mg total) by mouth every 8 (eight) hours as needed for nausea or vomiting., Disp: 20 tablet, Rfl: 0   oxyCODONE (OXY IR/ROXICODONE) 5 MG immediate release tablet, Take 1-2 tablets (5-10 mg total) by mouth every 6 (six) hours as needed for moderate pain or severe pain., Disp: 20 tablet, Rfl: 0   pantoprazole (PROTONIX) 40 MG tablet, Take 1 tablet (40 mg total) by mouth daily., Disp: 30 tablet, Rfl: 1   polyethylene glycol (MIRALAX / GLYCOLAX) 17 g packet, Take 17 g by mouth  daily as needed for moderate constipation., Disp: , Rfl:    PRESCRIPTION MEDICATION, Inject 20 Units as directed in the morning and at bedtime. Rx from [place], [place]: , Rfl:    senna (SENOKOT) 8.6 MG tablet, Take 2 tablets by mouth daily., Disp: , Rfl:   Allergies:  Allergies  Allergen Reactions   Amoxicillin     09/01/2020 Daughter is unsure if patient is allergic to Amoxicillin or Clarithromycin. Caused rash and swelling.   Bactrim [Sulfamethoxazole-Trimethoprim] Swelling   Clarithromycin     09/01/2020 Daughter is unsure if Clarithromycin or Amoxicillin caused rash and swelling.    Past Medical History, Surgical history, Social  history, and Family History were reviewed and updated.  Review of Systems: Review of Systems  Constitutional: Negative.   HENT:  Negative.    Eyes: Negative.   Respiratory:  Positive for cough.   Cardiovascular: Negative.   Gastrointestinal:  Positive for abdominal pain.  Endocrine: Negative.   Genitourinary: Negative.    Musculoskeletal: Negative.   Skin: Negative.   Neurological: Negative.   Hematological: Negative.   Psychiatric/Behavioral: Negative.     Physical Exam:  weight is 144 lb 12.8 oz (65.7 kg). Her oral temperature is 97.6 F (36.4 C). Her blood pressure is 115/51 (abnormal) and her pulse is 86. Her respiration is 17 and oxygen saturation is 100%.   Wt Readings from Last 3 Encounters:  10/13/20 144 lb 12.8 oz (65.7 kg)  10/02/20 142 lb (64.4 kg)  10/01/20 145 lb (65.8 kg)    Physical Exam Vitals reviewed.  HENT:     Head: Normocephalic and atraumatic.  Eyes:     Pupils: Pupils are equal, round, and reactive to light.  Cardiovascular:     Rate and Rhythm: Normal rate and regular rhythm.     Heart sounds: Normal heart sounds.  Pulmonary:     Effort: Pulmonary effort is normal.     Breath sounds: Normal breath sounds.  Abdominal:     General: Bowel sounds are normal.     Palpations: Abdomen is soft.     Comments: Abdominal exam shows the healing laparotomy scar in the midline.  She has little bit of distention about the laparotomy scar.  She has no fluid wave.  Maybe a little bit of tenderness in the right upper quadrant.  Bowel sounds are present.  There is no obvious liver or spleen tip that is palpable.  Musculoskeletal:        General: No tenderness or deformity. Normal range of motion.     Cervical back: Normal range of motion.  Lymphadenopathy:     Cervical: No cervical adenopathy.  Skin:    General: Skin is warm and dry.     Findings: No erythema or rash.  Neurological:     Mental Status: She is alert and oriented to person, place, and time.   Psychiatric:        Behavior: Behavior normal.        Thought Content: Thought content normal.        Judgment: Judgment normal.     Lab Results  Component Value Date   WBC 4.1 10/13/2020   HGB 10.2 (L) 10/13/2020   HCT 31.0 (L) 10/13/2020   MCV 85.2 10/13/2020   PLT 210 10/13/2020     Chemistry      Component Value Date/Time   NA 131 (L) 10/13/2020 1031   K 4.7 10/13/2020 1031   CL 102 10/13/2020 1031   CO2 24 10/13/2020 1031  BUN 15 10/13/2020 1031   CREATININE 0.85 10/13/2020 1031      Component Value Date/Time   CALCIUM 10.1 10/13/2020 1031   ALKPHOS 433 (H) 10/13/2020 1031   AST 353 (HH) 10/13/2020 1031   ALT 242 (H) 10/13/2020 1031   BILITOT 1.6 (H) 10/13/2020 1031       Impression and Plan: [name] is a very charming 75 year old woman from [place].  She has hepatocellular carcinoma.  She also has Hepatitis C.  We will have to make a change in her protocol.  Again, I just do not think that we will be able to utilize our immunotherapy.  Of note, her alpha-fetoprotein come down quite nicely after 1 treatment.  It went from 70 down to 17.  I think we will have to give her a month now.  I still want to try to hold off on steroids just because of her diabetes.  I think steroids will certainly create a huge problem with respect to her diabetes.  I will have her come back to [place] in 2 weeks for another metabolic panel.  Again, we can probably try to get her on Lenvima.  I think this would be a reasonable option.    Volanda Napoleon, MD 8/29/202211:48 AM

## 2020-10-13 NOTE — Progress Notes (Signed)
Patient treatment discontinued due to hepatotoxicity. After she recovers will initiate new treatment.   Oncology Nurse Navigator Documentation  Oncology Nurse Navigator Flowsheets 10/13/2020  Abnormal Finding Date -  Confirmed Diagnosis Date -  Diagnosis Status -  Planned Course of Treatment -  Phase of Treatment -  Chemotherapy Actual Start Date: -  Navigator Follow Up Date: 11/10/2020  Navigator Follow Up Reason: Follow-up Appointment;Chemotherapy  Navigator Location [facility]  Referral Date to RadOnc/MedOnc -  Navigator Encounter Type Appt/Treatment Plan Review  Telephone -  Multidisiplinary Clinic Date -  Multidisiplinary Clinic Type -  Treatment Initiated Date -  Patient Visit Type MedOnc  Treatment Phase Active Tx  Barriers/Navigation Needs -  Education -  Interventions None Required  Acuity Level 2-Minimal Needs (1-2 Barriers Identified)  Coordination of Care -  Education Method -  Support Groups/Services Friends and Family  Time Spent with Patient 15

## 2020-10-14 ENCOUNTER — Telehealth: Payer: Self-pay

## 2020-10-14 ENCOUNTER — Encounter: Payer: Self-pay | Admitting: Hematology & Oncology

## 2020-10-14 LAB — AFP TUMOR MARKER: AFP, Serum, Tumor Marker: 11.3 ng/mL — ABNORMAL HIGH (ref 0.0–9.2)

## 2020-10-16 ENCOUNTER — Other Ambulatory Visit: Payer: Self-pay

## 2020-10-16 ENCOUNTER — Encounter: Payer: Self-pay | Admitting: Hematology & Oncology

## 2020-10-21 ENCOUNTER — Encounter: Payer: Self-pay | Admitting: Hematology & Oncology

## 2020-10-27 ENCOUNTER — Inpatient Hospital Stay: Payer: Self-pay | Attending: Hematology & Oncology

## 2020-10-27 ENCOUNTER — Other Ambulatory Visit: Payer: Self-pay

## 2020-10-27 ENCOUNTER — Inpatient Hospital Stay: Payer: Self-pay

## 2020-10-27 ENCOUNTER — Other Ambulatory Visit: Payer: Self-pay | Admitting: Family

## 2020-10-27 VITALS — BP 136/73 | HR 92 | Temp 98.4°F | Resp 17

## 2020-10-27 DIAGNOSIS — B1920 Unspecified viral hepatitis C without hepatic coma: Secondary | ICD-10-CM | POA: Insufficient documentation

## 2020-10-27 DIAGNOSIS — Z79899 Other long term (current) drug therapy: Secondary | ICD-10-CM | POA: Insufficient documentation

## 2020-10-27 DIAGNOSIS — C220 Liver cell carcinoma: Secondary | ICD-10-CM | POA: Insufficient documentation

## 2020-10-27 DIAGNOSIS — B182 Chronic viral hepatitis C: Secondary | ICD-10-CM

## 2020-10-27 LAB — CMP (CANCER CENTER ONLY)
ALT: 160 U/L — ABNORMAL HIGH (ref 0–44)
AST: 154 U/L — ABNORMAL HIGH (ref 15–41)
Albumin: 3.7 g/dL (ref 3.5–5.0)
Alkaline Phosphatase: 327 U/L — ABNORMAL HIGH (ref 38–126)
Anion gap: 6 (ref 5–15)
BUN: 15 mg/dL (ref 8–23)
CO2: 24 mmol/L (ref 22–32)
Calcium: 10.2 mg/dL (ref 8.9–10.3)
Chloride: 103 mmol/L (ref 98–111)
Creatinine: 0.57 mg/dL (ref 0.44–1.00)
GFR, Estimated: >60 mL/min (ref >60)
Glucose, Bld: 86 mg/dL (ref 70–99)
Potassium: 3.8 mmol/L (ref 3.5–5.1)
Sodium: 133 mmol/L — ABNORMAL LOW (ref 135–145)
Total Bilirubin: 1.9 mg/dL — ABNORMAL HIGH (ref 0.3–1.2)
Total Protein: 8.2 g/dL — ABNORMAL HIGH (ref 6.5–8.1)

## 2020-10-27 MED ORDER — SODIUM CHLORIDE 0.9% FLUSH
10.0000 mL | Freq: Once | INTRAVENOUS | Status: AC
  Administered 2020-10-27: 10 mL via INTRAVENOUS

## 2020-10-27 MED ORDER — HEPARIN SOD (PORK) LOCK FLUSH 100 UNIT/ML IV SOLN
500.0000 [IU] | Freq: Once | INTRAVENOUS | Status: AC
  Administered 2020-10-27: 500 [IU] via INTRAVENOUS

## 2020-10-27 NOTE — Patient Instructions (Signed)

## 2020-10-28 ENCOUNTER — Encounter: Payer: Self-pay | Admitting: *Deleted

## 2020-10-30 ENCOUNTER — Other Ambulatory Visit: Payer: Self-pay

## 2020-10-30 ENCOUNTER — Other Ambulatory Visit (INDEPENDENT_AMBULATORY_CARE_PROVIDER_SITE_OTHER): Payer: Self-pay | Admitting: Family Medicine

## 2020-10-30 DIAGNOSIS — B182 Chronic viral hepatitis C: Secondary | ICD-10-CM

## 2020-10-30 MED ORDER — PANTOPRAZOLE SODIUM 40 MG PO TBEC
40.0000 mg | DELAYED_RELEASE_TABLET | Freq: Every day | ORAL | 1 refills | Status: DC
  Filled 2020-10-30: qty 30, 30d supply, fill #0

## 2020-10-30 NOTE — Telephone Encounter (Signed)
Requested medication (s) are due for refill today: yes  Requested medication (s) are on the active medication list: yes  Last refill:  09/25/20- 09/25/21 #30 1 refill  Future visit scheduled: yes in 1 month  Notes to clinic:  Pharmacy comment: pt states they take 2 tabs daily. [place] do you want to renew Rx for 60 tabs per month ?    Requested Prescriptions  Pending Prescriptions Disp Refills   pantoprazole (PROTONIX) 40 MG tablet 30 tablet 1    Sig: Take 1 tablet (40 mg total) by mouth daily.     Gastroenterology: Proton Pump Inhibitors Passed - 10/30/2020 10:10 AM      Passed - Valid encounter within last 12 months    Recent Outpatient Visits           1 month ago Controlled type 2 diabetes mellitus without complication, with long-term current use of insulin [facility])   Gordon, MD   1 month ago Primary hypertension   Bulpitt, Fults P, NP       Future Appointments             In 5 days Comer, Okey Regal, MD [facility]   In 1 month Ladell Pier, MD [place]

## 2020-10-31 ENCOUNTER — Other Ambulatory Visit: Payer: Self-pay

## 2020-10-31 ENCOUNTER — Encounter: Payer: Self-pay | Admitting: Internal Medicine

## 2020-10-31 DIAGNOSIS — K255 Chronic or unspecified gastric ulcer with perforation: Secondary | ICD-10-CM

## 2020-10-31 DIAGNOSIS — I10 Essential (primary) hypertension: Secondary | ICD-10-CM

## 2020-10-31 DIAGNOSIS — C220 Liver cell carcinoma: Secondary | ICD-10-CM

## 2020-10-31 MED ORDER — PANTOPRAZOLE SODIUM 40 MG PO TBEC
40.0000 mg | DELAYED_RELEASE_TABLET | Freq: Two times a day (BID) | ORAL | 3 refills | Status: DC
  Filled 2020-10-31: qty 60, 30d supply, fill #0
  Filled 2020-12-02 – 2020-12-04 (×2): qty 60, 30d supply, fill #1
  Filled 2021-01-01: qty 60, 30d supply, fill #2
  Filled 2021-01-26: qty 60, 30d supply, fill #3

## 2020-11-03 ENCOUNTER — Other Ambulatory Visit: Payer: Self-pay

## 2020-11-03 ENCOUNTER — Encounter: Payer: Self-pay | Admitting: Hematology & Oncology

## 2020-11-04 ENCOUNTER — Ambulatory Visit: Payer: Self-pay | Admitting: Internal Medicine

## 2020-11-04 ENCOUNTER — Telehealth: Payer: Self-pay

## 2020-11-04 ENCOUNTER — Other Ambulatory Visit: Payer: Self-pay

## 2020-11-04 LAB — HEPATITIS C GENOTYPE

## 2020-11-04 NOTE — Telephone Encounter (Signed)
Patient's daughter contacted office to determine whether appointment needed to be rescheduled. Genotype was not drawn. Per office note, treatment is pending [place] treatment and additional lab work.   Glorious Flicker Lorita Officer, RN

## 2020-11-10 ENCOUNTER — Inpatient Hospital Stay (HOSPITAL_BASED_OUTPATIENT_CLINIC_OR_DEPARTMENT_OTHER): Payer: Self-pay | Admitting: Hematology & Oncology

## 2020-11-10 ENCOUNTER — Other Ambulatory Visit: Payer: Self-pay

## 2020-11-10 ENCOUNTER — Other Ambulatory Visit: Payer: Self-pay | Admitting: Nurse Practitioner

## 2020-11-10 ENCOUNTER — Inpatient Hospital Stay: Payer: Self-pay

## 2020-11-10 ENCOUNTER — Encounter: Payer: Self-pay | Admitting: Hematology & Oncology

## 2020-11-10 ENCOUNTER — Encounter: Payer: Self-pay | Admitting: *Deleted

## 2020-11-10 VITALS — BP 115/50 | HR 81 | Temp 98.5°F | Resp 17 | Ht 64.17 in | Wt 138.8 lb

## 2020-11-10 DIAGNOSIS — C220 Liver cell carcinoma: Secondary | ICD-10-CM

## 2020-11-10 LAB — CBC WITH DIFFERENTIAL (CANCER CENTER ONLY)
Abs Immature Granulocytes: 0 10*3/uL (ref 0.00–0.07)
Basophils Absolute: 0 10*3/uL (ref 0.0–0.1)
Basophils Relative: 1 %
Eosinophils Absolute: 0.2 10*3/uL (ref 0.0–0.5)
Eosinophils Relative: 4 %
HCT: 34.9 % — ABNORMAL LOW (ref 36.0–46.0)
Hemoglobin: 11.7 g/dL — ABNORMAL LOW (ref 12.0–15.0)
Immature Granulocytes: 0 %
Lymphocytes Relative: 41 %
Lymphs Abs: 1.7 10*3/uL (ref 0.7–4.0)
MCH: 28.4 pg (ref 26.0–34.0)
MCHC: 33.5 g/dL (ref 30.0–36.0)
MCV: 84.7 fL (ref 80.0–100.0)
Monocytes Absolute: 0.3 10*3/uL (ref 0.1–1.0)
Monocytes Relative: 7 %
Neutro Abs: 2.1 10*3/uL (ref 1.7–7.7)
Neutrophils Relative %: 47 %
Platelet Count: 155 10*3/uL (ref 150–400)
RBC: 4.12 MIL/uL (ref 3.87–5.11)
RDW: 19.6 % — ABNORMAL HIGH (ref 11.5–15.5)
WBC Count: 4.3 10*3/uL (ref 4.0–10.5)
nRBC: 0 % (ref 0.0–0.2)

## 2020-11-10 LAB — CMP (CANCER CENTER ONLY)
ALT: 115 U/L — ABNORMAL HIGH (ref 0–44)
AST: 107 U/L — ABNORMAL HIGH (ref 15–41)
Albumin: 3.6 g/dL (ref 3.5–5.0)
Alkaline Phosphatase: 217 U/L — ABNORMAL HIGH (ref 38–126)
Anion gap: 6 (ref 5–15)
BUN: 14 mg/dL (ref 8–23)
CO2: 24 mmol/L (ref 22–32)
Calcium: 9.9 mg/dL (ref 8.9–10.3)
Chloride: 105 mmol/L (ref 98–111)
Creatinine: 0.62 mg/dL (ref 0.44–1.00)
GFR, Estimated: >60 mL/min (ref >60)
Glucose, Bld: 218 mg/dL — ABNORMAL HIGH (ref 70–99)
Potassium: 3.8 mmol/L (ref 3.5–5.1)
Sodium: 135 mmol/L (ref 135–145)
Total Bilirubin: 1.5 mg/dL — ABNORMAL HIGH (ref 0.3–1.2)
Total Protein: 7.8 g/dL (ref 6.5–8.1)

## 2020-11-10 LAB — LACTATE DEHYDROGENASE: LDH: 171 U/L (ref 98–192)

## 2020-11-10 MED ORDER — HEPARIN SOD (PORK) LOCK FLUSH 100 UNIT/ML IV SOLN
500.0000 [IU] | Freq: Once | INTRAVENOUS | Status: AC
  Administered 2020-11-10: 500 [IU] via INTRAVENOUS

## 2020-11-10 MED ORDER — SODIUM CHLORIDE 0.9% FLUSH
10.0000 mL | Freq: Once | INTRAVENOUS | Status: AC
  Administered 2020-11-10: 10 mL via INTRAVENOUS

## 2020-11-10 MED ORDER — GABAPENTIN 100 MG PO CAPS: 100.0000 mg | ORAL_CAPSULE | Freq: Three times a day (TID) | ORAL | 3 refills | Status: DC

## 2020-11-10 NOTE — Progress Notes (Signed)
Immunotherapy will be discontinued. Patient will come back in 2 weeks and if hepatic function is WNL Dr Marin Olp will begin patient on oral Lenvima.   Oncology Nurse Navigator Documentation  Oncology Nurse Navigator Flowsheets 11/10/2020  Abnormal Finding Date -  Confirmed Diagnosis Date -  Diagnosis Status -  Planned Course of Treatment -  Phase of Treatment -  Chemotherapy Actual Start Date: -  Navigator Follow Up Date: 11/21/2020  Navigator Follow Up Reason: Follow-up Appointment;Chemotherapy  Navigator Location [facility]  Referral Date to RadOnc/MedOnc -  Navigator Encounter Type Appt/Treatment Plan Review  Telephone -  Multidisiplinary Clinic Date -  Multidisiplinary Clinic Type -  Treatment Initiated Date -  Patient Visit Type MedOnc  Treatment Phase Active Tx  Barriers/Navigation Needs Coordination of Care;Education  Education -  Interventions Psycho-Social Support  Acuity Level 2-Minimal Needs (1-2 Barriers Identified)  Coordination of Care -  Education Method -  Support Groups/Services Friends and Family  Time Spent with Patient 15

## 2020-11-10 NOTE — Progress Notes (Signed)
Hematology and Oncology Follow Up Visit  [name] [name] [id number] July 18, 1945 75 y.o. 11/10/2020   Principle Diagnosis:  Hepatocellular carcinoma-multifocal Hepatitis C  Current Therapy:   Status post cycle 1 of nivolumab/ipilimumab -- d/c on 10/13/2020 due to hepatic toxicity     Interim History:  [name] [name] is back for follow-up.  Thankfully, things are looking better with her liver.  The liver tests are fine getting better.  Her alpha-fetoprotein is also come down a little bit.  The last time we checked it was down to 11.  Clearly, the immunotherapy has helped.  I think that the combination may have been too much on her liver.  As such, I am going to try to adjust use 1 immunotherapy agent.  I will try her on nivolumab.  Because of the gastric perforation that she had, we cannot use Avastin.  I am not sure if we can use the TKI agents as I think perforation is a complication of them.  She is eating okay.  She is having no nausea or vomiting.  She has lost little bit of weight.  There is been no diarrhea.  She has had no obvious change in bowel or bladder habits.  There is been no issues with fever.  She has had no leg swelling.  She has had no rashes.  Overall, I would say her performance status is probably ECOG 1.    Medications:  Current Outpatient Medications:    acetaminophen (TYLENOL) 325 MG tablet, Take 2 tablets (650 mg total) by mouth every 6 (six) hours as needed for mild pain, moderate pain or fever., Disp: 30 tablet, Rfl: 0   brimonidine (ALPHAGAN P) 0.1 % SOLN, Place 1 drop into both eyes in the morning and at bedtime., Disp: , Rfl:    brimonidine (ALPHAGAN) 0.2 % ophthalmic solution, 1 drop 2 (two) times daily., Disp: , Rfl:    diphenhydramine-acetaminophen (TYLENOL PM) 25-500 MG TABS tablet, Take 1 tablet by mouth at bedtime as needed (sleep)., Disp: , Rfl:    hydrochlorothiazide (HYDRODIURIL) 25 MG tablet, Take 1 tablet (25 mg total) by  mouth daily., Disp: 90 tablet, Rfl: 1   insulin NPH-regular Human (NOVOLIN 70/30) (70-30) 100 UNIT/ML injection, Inject 15 Units into the skin 2 (two) times daily with a meal., Disp: 10 mL, Rfl: 3   losartan (COZAAR) 25 MG tablet, Take 2 tablets (50 mg total) by mouth at bedtime., Disp: 90 tablet, Rfl: 1   NIFEdipine (PROCARDIA-XL/NIFEDICAL-XL) 30 MG 24 hr tablet, Take 1 tablet (30 mg total) by mouth in the morning and at bedtime., Disp: 30 tablet, Rfl: 1   ondansetron (ZOFRAN ODT) 4 MG disintegrating tablet, Take 1 tablet (4 mg total) by mouth every 8 (eight) hours as needed for nausea or vomiting., Disp: 20 tablet, Rfl: 0   oxyCODONE (OXY IR/ROXICODONE) 5 MG immediate release tablet, Take 1-2 tablets (5-10 mg total) by mouth every 6 (six) hours as needed for moderate pain or severe pain., Disp: 20 tablet, Rfl: 0   pantoprazole (PROTONIX) 40 MG tablet, Take 1 tablet (40 mg total) by mouth 2 (two) times daily., Disp: 60 tablet, Rfl: 3   polyethylene glycol (MIRALAX / GLYCOLAX) 17 g packet, Take 17 g by mouth daily as needed for moderate constipation., Disp: , Rfl:    PRESCRIPTION MEDICATION, Inject 20 Units as directed in the morning and at bedtime. Rx from [place], [place]: , Rfl:    senna (SENOKOT) 8.6 MG tablet, Take 2 tablets by mouth  daily., Disp: , Rfl:   Allergies:  Allergies  Allergen Reactions   Amoxicillin     09/01/2020 Daughter is unsure if patient is allergic to Amoxicillin or Clarithromycin. Caused rash and swelling.   Bactrim [Sulfamethoxazole-Trimethoprim] Swelling   Clarithromycin     09/01/2020 Daughter is unsure if Clarithromycin or Amoxicillin caused rash and swelling.    Past Medical History, Surgical history, Social history, and Family History were reviewed and updated.  Review of Systems: Review of Systems  Constitutional: Negative.   HENT:  Negative.    Eyes: Negative.   Respiratory:  Positive for cough.   Cardiovascular: Negative.   Gastrointestinal:  Positive  for abdominal pain.  Endocrine: Negative.   Genitourinary: Negative.    Musculoskeletal: Negative.   Skin: Negative.   Neurological: Negative.   Hematological: Negative.   Psychiatric/Behavioral: Negative.     Physical Exam:  vitals were not taken for this visit.   Wt Readings from Last 3 Encounters:  10/13/20 144 lb 12.8 oz (65.7 kg)  10/02/20 142 lb (64.4 kg)  10/01/20 145 lb (65.8 kg)    Physical Exam Vitals reviewed.  HENT:     Head: Normocephalic and atraumatic.  Eyes:     Pupils: Pupils are equal, round, and reactive to light.  Cardiovascular:     Rate and Rhythm: Normal rate and regular rhythm.     Heart sounds: Normal heart sounds.  Pulmonary:     Effort: Pulmonary effort is normal.     Breath sounds: Normal breath sounds.  Abdominal:     General: Bowel sounds are normal.     Palpations: Abdomen is soft.     Comments: Abdominal exam shows the healing laparotomy scar in the midline.  She has little bit of distention about the laparotomy scar.  She has no fluid wave.  Maybe a little bit of tenderness in the right upper quadrant.  Bowel sounds are present.  There is no obvious liver or spleen tip that is palpable.  Musculoskeletal:        General: No tenderness or deformity. Normal range of motion.     Cervical back: Normal range of motion.  Lymphadenopathy:     Cervical: No cervical adenopathy.  Skin:    General: Skin is warm and dry.     Findings: No erythema or rash.  Neurological:     Mental Status: She is alert and oriented to person, place, and time.  Psychiatric:        Behavior: Behavior normal.        Thought Content: Thought content normal.        Judgment: Judgment normal.     Lab Results  Component Value Date   WBC 4.3 11/10/2020   HGB 11.7 (L) 11/10/2020   HCT 34.9 (L) 11/10/2020   MCV 84.7 11/10/2020   PLT 155 11/10/2020     Chemistry      Component Value Date/Time   NA 133 (L) 10/27/2020 1441   K 3.8 10/27/2020 1441   CL 103  10/27/2020 1441   CO2 24 10/27/2020 1441   BUN 15 10/27/2020 1441   CREATININE 0.57 10/27/2020 1441      Component Value Date/Time   CALCIUM 10.2 10/27/2020 1441   ALKPHOS 327 (H) 10/27/2020 1441   AST 154 (H) 10/27/2020 1441   ALT 160 (H) 10/27/2020 1441   BILITOT 1.9 (H) 10/27/2020 1441       Impression and Plan: [name] [name] is a very charming 75 year old woman from  [place].  She has hepatocellular carcinoma.  She also has Hepatitis C.  We will have to make a change in her protocol.  Again, I just do not think that we will be able to utilize our immunotherapy.  I will try her on nivolumab.  We will have her start treatment in a couple weeks.  We will see how she does.  Hopefully, her liver tests will hold out.  I am just happy that her liver tests are fine get better.  We will plan to have her come back and see how everything looks.   Volanda Napoleon, MD 9/26/20229:13 AM

## 2020-11-10 NOTE — Addendum Note (Signed)
Addended by: Tyler Aas A on: 11/10/2020 09:55 AM   Modules accepted: Orders

## 2020-11-11 ENCOUNTER — Telehealth: Payer: Self-pay

## 2020-11-11 LAB — AFP TUMOR MARKER: AFP, Serum, Tumor Marker: 14.6 ng/mL — ABNORMAL HIGH (ref 0.0–9.2)

## 2020-11-11 NOTE — Telephone Encounter (Signed)
Called and informed pateints daughter of results and will be restarting treatment. She is aware and understands scheduling will call her.

## 2020-11-11 NOTE — Telephone Encounter (Signed)
-----   Message from Volanda Napoleon, MD sent at 11/11/2020  6:38 AM EDT ----- Call her dgtr -- the AFP is a little elevated, but not too bad.  We need to get her back on therapy next week.  Laurey Arrow

## 2020-11-12 ENCOUNTER — Other Ambulatory Visit (INDEPENDENT_AMBULATORY_CARE_PROVIDER_SITE_OTHER): Payer: Self-pay | Admitting: Family Medicine

## 2020-11-12 ENCOUNTER — Encounter: Payer: Self-pay | Admitting: Hematology & Oncology

## 2020-11-12 MED ORDER — NIFEDIPINE ER OSMOTIC RELEASE 30 MG PO TB24
30.0000 mg | ORAL_TABLET | Freq: Two times a day (BID) | ORAL | 2 refills | Status: DC
  Filled 2020-11-12: qty 60, 30d supply, fill #0
  Filled 2021-01-16: qty 60, 30d supply, fill #1
  Filled 2021-03-15: qty 60, 30d supply, fill #2
  Filled 2021-03-16: qty 60, 30d supply, fill #0

## 2020-11-13 ENCOUNTER — Other Ambulatory Visit: Payer: Self-pay

## 2020-11-13 ENCOUNTER — Encounter: Payer: Self-pay | Admitting: Hematology & Oncology

## 2020-11-15 DIAGNOSIS — E1165 Type 2 diabetes mellitus with hyperglycemia: Secondary | ICD-10-CM | POA: Insufficient documentation

## 2020-11-15 DIAGNOSIS — B1920 Unspecified viral hepatitis C without hepatic coma: Secondary | ICD-10-CM | POA: Insufficient documentation

## 2020-11-15 DIAGNOSIS — Z5111 Encounter for antineoplastic chemotherapy: Secondary | ICD-10-CM | POA: Insufficient documentation

## 2020-11-15 DIAGNOSIS — C220 Liver cell carcinoma: Secondary | ICD-10-CM | POA: Insufficient documentation

## 2020-11-17 ENCOUNTER — Other Ambulatory Visit: Payer: Self-pay

## 2020-11-17 ENCOUNTER — Telehealth: Payer: Self-pay

## 2020-11-17 NOTE — Telephone Encounter (Signed)
Concerning the blue card. Patient has been approved for CAFA.

## 2020-11-21 ENCOUNTER — Other Ambulatory Visit: Payer: Self-pay

## 2020-11-21 ENCOUNTER — Encounter: Payer: Self-pay | Admitting: *Deleted

## 2020-11-21 ENCOUNTER — Inpatient Hospital Stay: Payer: Self-pay | Attending: Hematology & Oncology | Admitting: Hematology & Oncology

## 2020-11-21 ENCOUNTER — Inpatient Hospital Stay: Payer: Self-pay

## 2020-11-21 ENCOUNTER — Encounter: Payer: Self-pay | Admitting: Hematology & Oncology

## 2020-11-21 ENCOUNTER — Telehealth: Payer: Self-pay | Admitting: *Deleted

## 2020-11-21 VITALS — BP 117/59 | HR 84 | Temp 98.0°F | Resp 18 | Wt 138.0 lb

## 2020-11-21 DIAGNOSIS — C220 Liver cell carcinoma: Secondary | ICD-10-CM

## 2020-11-21 DIAGNOSIS — Z95828 Presence of other vascular implants and grafts: Secondary | ICD-10-CM

## 2020-11-21 LAB — CBC WITH DIFFERENTIAL (CANCER CENTER ONLY)
Abs Immature Granulocytes: 0.02 10*3/uL (ref 0.00–0.07)
Basophils Absolute: 0 10*3/uL (ref 0.0–0.1)
Basophils Relative: 0 %
Eosinophils Absolute: 0 10*3/uL (ref 0.0–0.5)
Eosinophils Relative: 1 %
HCT: 36.2 % (ref 36.0–46.0)
Hemoglobin: 12 g/dL (ref 12.0–15.0)
Immature Granulocytes: 0 %
Lymphocytes Relative: 31 %
Lymphs Abs: 1.6 10*3/uL (ref 0.7–4.0)
MCH: 28.2 pg (ref 26.0–34.0)
MCHC: 33.1 g/dL (ref 30.0–36.0)
MCV: 85.2 fL (ref 80.0–100.0)
Monocytes Absolute: 0.3 10*3/uL (ref 0.1–1.0)
Monocytes Relative: 5 %
Neutro Abs: 3.3 10*3/uL (ref 1.7–7.7)
Neutrophils Relative %: 63 %
Platelet Count: 175 10*3/uL (ref 150–400)
RBC: 4.25 MIL/uL (ref 3.87–5.11)
RDW: 19.8 % — ABNORMAL HIGH (ref 11.5–15.5)
WBC Count: 5.2 10*3/uL (ref 4.0–10.5)
nRBC: 0 % (ref 0.0–0.2)

## 2020-11-21 LAB — CMP (CANCER CENTER ONLY)
ALT: 55 U/L — ABNORMAL HIGH (ref 0–44)
AST: 51 U/L — ABNORMAL HIGH (ref 15–41)
Albumin: 3.7 g/dL (ref 3.5–5.0)
Alkaline Phosphatase: 194 U/L — ABNORMAL HIGH (ref 38–126)
Anion gap: 5 (ref 5–15)
BUN: 20 mg/dL (ref 8–23)
CO2: 25 mmol/L (ref 22–32)
Calcium: 10.7 mg/dL — ABNORMAL HIGH (ref 8.9–10.3)
Chloride: 102 mmol/L (ref 98–111)
Creatinine: 0.72 mg/dL (ref 0.44–1.00)
GFR, Estimated: >60 mL/min (ref >60)
Glucose, Bld: 258 mg/dL — ABNORMAL HIGH (ref 70–99)
Potassium: 3.9 mmol/L (ref 3.5–5.1)
Sodium: 132 mmol/L — ABNORMAL LOW (ref 135–145)
Total Bilirubin: 1.1 mg/dL (ref 0.3–1.2)
Total Protein: 7.7 g/dL (ref 6.5–8.1)

## 2020-11-21 LAB — LACTATE DEHYDROGENASE: LDH: 155 U/L (ref 98–192)

## 2020-11-21 MED ORDER — SODIUM CHLORIDE 0.9% FLUSH
10.0000 mL | INTRAVENOUS | Status: DC | PRN
  Administered 2020-11-21: 10 mL

## 2020-11-21 MED ORDER — SODIUM CHLORIDE 0.9% FLUSH
10.0000 mL | Freq: Once | INTRAVENOUS | Status: AC
  Administered 2020-11-21: 10 mL via INTRAVENOUS

## 2020-11-21 MED ORDER — HEPARIN SOD (PORK) LOCK FLUSH 100 UNIT/ML IV SOLN
500.0000 [IU] | Freq: Once | INTRAVENOUS | Status: AC | PRN
  Administered 2020-11-21: 500 [IU]

## 2020-11-21 MED ORDER — SODIUM CHLORIDE 0.9 % IV SOLN: Freq: Once | INTRAVENOUS | Status: AC

## 2020-11-21 MED ORDER — HEPARIN SOD (PORK) LOCK FLUSH 100 UNIT/ML IV SOLN
500.0000 [IU] | Freq: Once | INTRAVENOUS | Status: AC
  Administered 2020-11-21: 500 [IU] via INTRAVENOUS

## 2020-11-21 MED ORDER — SODIUM CHLORIDE 0.9 % IV SOLN
240.0000 mg | Freq: Once | INTRAVENOUS | Status: AC
  Administered 2020-11-21: 240 mg via INTRAVENOUS
  Filled 2020-11-21: qty 24

## 2020-11-21 NOTE — Telephone Encounter (Signed)
Per Secure chat - [name] [name] - 2 hr. infusion

## 2020-11-21 NOTE — Patient Instructions (Addendum)
[facility]  Discharge Instructions: Thank you for choosing [facility] to provide your oncology and hematology care.   If you have a lab appointment with the Cancer Center, please go directly to the Cancer Center and check in at the registration area.  Wear comfortable clothing and clothing appropriate for easy access to any Portacath or PICC line.   We strive to give you quality time with your provider. You may need to reschedule your appointment if you arrive late (15 or more minutes).  Arriving late affects you and other patients whose appointments are after yours.  Also, if you miss three or more appointments without notifying the office, you may be dismissed from the clinic at the provider's discretion.      For prescription refill requests, have your pharmacy contact our office and allow 72 hours for refills to be completed.    Today you received the following chemotherapy and/or immunotherapy agents:  Opdivo      To help prevent nausea and vomiting after your treatment, we encourage you to take your nausea medication as directed.  BELOW ARE SYMPTOMS THAT SHOULD BE REPORTED IMMEDIATELY: *FEVER GREATER THAN 100.4 F (38 C) OR HIGHER *CHILLS OR SWEATING *NAUSEA AND VOMITING THAT IS NOT CONTROLLED WITH YOUR NAUSEA MEDICATION *UNUSUAL SHORTNESS OF BREATH *UNUSUAL BRUISING OR BLEEDING *URINARY PROBLEMS (pain or burning when urinating, or frequent urination) *BOWEL PROBLEMS (unusual diarrhea, constipation, pain near the anus) TENDERNESS IN MOUTH AND THROAT WITH OR WITHOUT PRESENCE OF ULCERS (sore throat, sores in mouth, or a toothache) UNUSUAL RASH, SWELLING OR PAIN  UNUSUAL VAGINAL DISCHARGE OR ITCHING   Items with * indicate a potential emergency and should be followed up as soon as possible or go to the Emergency Department if any problems should occur.  Please show the CHEMOTHERAPY ALERT CARD or IMMUNOTHERAPY ALERT CARD at check-in to the  Emergency Department and triage nurse. Should you have questions after your visit or need to cancel or reschedule your appointment, please contact [facility]  [phone] and follow the prompts.  Office hours are 8:00 a.m. to 4:30 p.m. Monday - Friday. Please note that voicemails left after 4:00 p.m. may not be returned until the following business day.  We are closed weekends and major holidays. You have access to a nurse at all times for urgent questions. Please call the main number to the clinic [phone] and follow the prompts.  For any non-urgent questions, you may also contact your provider using MyChart. We now offer e-Visits for anyone 18 and older to request care online for non-urgent symptoms. For details visit [email or web address].   Also download the MyChart app! Go to the app store, search "MyChart", open the app, select , and log in with your MyChart username and password.  Due to Covid, a mask is required upon entering the hospital/clinic. If you do not have a mask, one will be given to you upon arrival. For doctor visits, patients may have 1 support person aged 18 or older with them. For treatment visits, patients cannot have anyone with them due to current Covid guidelines and our immunocompromised population.  

## 2020-11-21 NOTE — Patient Instructions (Signed)
Tunneled Central Venous Catheter Flushing Guide It is important to flush your tunneled central venous catheter each time you use it, both before and after you use it. Flushing your catheter will help prevent it from clogging. What are the risks? Risks may include: Infection. Air getting into the catheter and bloodstream. Supplies needed: A clean pair of gloves. A disinfecting wipe. Use an alcohol wipe, chlorhexidine wipe, or iodine wipe as told by your health care provider. A 10 mL syringe that has been prefilled with saline solution. An empty 10 mL syringe, if a substance called heparin was injected into your catheter. How to flush your catheter When you flush your catheter, make sure you follow any specific instructions from your health care provider or the manufacturer. These are general guidelines. Flushing your catheter before use If there is heparin in your catheter: Wash your hands with soap and water. Put on gloves. Scrub the injection cap for a minimum of 15 seconds with a disinfecting wipe. Unclamp the catheter. Attach the empty syringe to the injection cap. Pull the syringe plunger back and withdraw 10 mL of blood. Place the syringe into an appropriate waste container. Scrub the injection cap for 15 seconds with a disinfecting wipe. Attach the prefilled syringe to the injection cap. Flush the catheter by pushing the plunger forward until all the liquid from the syringe is in the catheter. Remove the syringe from the injection cap. Clamp the catheter. If there is no heparin in your catheter: Wash your hands with soap and water. Put on gloves. Scrub the injection cap for 15 seconds with a disinfecting wipe. Unclamp the catheter. Attach the prefilled syringe to the injection cap. Flush the catheter by pushing the plunger forward until 5 mL of the liquid from the syringe is in the catheter. Pull back on the syringe until you see blood in the catheter. If you have been asked  to collect any blood, follow your health care provider's instructions. Otherwise, flush the catheter with the rest of the solution from the syringe. Remove the syringe from the injection cap. Clamp the catheter.  Flushing your catheter after use Wash your hands with soap and water. Put on gloves. Scrub the injection cap for 15 seconds with a disinfecting wipe. Unclamp the catheter. Attach the prefilled syringe to the injection cap. Flush the catheter by pushing the plunger forward until all of the liquid from the syringe is in the catheter. Remove the syringe from the injection cap. Clamp the catheter. Problems and solutions If blood cannot be completely cleared from the injection cap, you may need to have the injection cap replaced. If the catheter is difficult to flush, use the pulsing method. The pulsing method involves pushing only a few milliliters of solution into the catheter at a time and pausing between pushes. If you do not see blood in the catheter when you pull back on the syringe, change your body position, such as by raising your arms above your head. Take a deep breath and cough. Then, pull back on the syringe. If you still do not see blood, flush the catheter with a small amount of solution. Then, change positions again and take a breath or cough. Pull back on the syringe again. If you still do not see blood, finish flushing the catheter and contact your health care provider. Do not use your catheter until your health care provider says it is okay. General tips Have someone help you flush your catheter, if possible. Do not force fluid   through your catheter. Do not use a syringe that is larger or smaller than 10 mL. Using a smaller syringe can make the catheter burst. Do not use your catheter without flushing it first if it has heparin in it. Contact a health care provider if: You cannot see any blood in the catheter when you flush it before using it. Your catheter is difficult  to flush. Get help right away if: You cannot flush the catheter. The catheter leaks when you flush it or when there is fluid in it. There are cracks or breaks in the catheter. Summary It is important to flush your tunneled central venous catheter each time you use it, both before and after you use it. Scrub the injection cap for 15 seconds with a disinfecting wipe before and after you flush it. When you flush your catheter, make sure you follow any specific instructions from your health care provider or the manufacturer. Get help right away if you cannot flush the catheter. This information is not intended to replace advice given to you by your health care provider. Make sure you discuss any questions you have with your health care provider. Document Revised: 04/12/2019 Document Reviewed: 04/19/2018 Elsevier Patient Education  2022 Elsevier Inc.  

## 2020-11-21 NOTE — Progress Notes (Signed)
Hematology and Oncology Follow Up Visit  [name] [name] [id number] 1946-01-10 75 y.o. 11/21/2020   Principle Diagnosis:  Hepatocellular carcinoma-multifocal Hepatitis C  Current Therapy:   Status post cycle 1 of nivolumab/ipilimumab -- d/c on 10/13/2020 due to hepatic toxicity Nivolumab 240 mg IV every 2 weeks-start on 11/21/2020     Interim History:  [name] [name] is back for follow-up.  Thankfully, things are looking better with her liver.  The liver tests are almost back to normal now.  As such, we will going try to get her started back on treatment.  I will use single agent nivolumab now.  I still cannot use Avastin.  I just do not think we can use this because of the recent gastric perforation that she had.  Her alpha-fetoprotein was up a little bit.  The level was 14.6.  I think this is an indicator that the cancer was becoming a bit more active.  Her blood sugars are quite high.  I know she is on insulin.  I told her and her daughter that she really has to get her blood sugars under better control for the immunotherapy to work.  She has had no problems with her bowels or bladder.  She is eating okay.  She has had no cough or shortness of breath.  Overall, I would say her performance status is probably ECOG 1.      Medications:  Current Outpatient Medications:    acetaminophen (TYLENOL) 325 MG tablet, Take 2 tablets (650 mg total) by mouth every 6 (six) hours as needed for mild pain, moderate pain or fever., Disp: 30 tablet, Rfl: 0   brimonidine (ALPHAGAN P) 0.1 % SOLN, Place 1 drop into both eyes in the morning and at bedtime., Disp: , Rfl:    brimonidine (ALPHAGAN) 0.2 % ophthalmic solution, 1 drop 2 (two) times daily., Disp: , Rfl:    diphenhydramine-acetaminophen (TYLENOL PM) 25-500 MG TABS tablet, Take 1 tablet by mouth at bedtime as needed (sleep)., Disp: , Rfl:    gabapentin (NEURONTIN) 100 MG capsule, Take 1 capsule (100 mg total) by mouth 3 (three)  times daily., Disp: 90 capsule, Rfl: 3   hydrochlorothiazide (HYDRODIURIL) 25 MG tablet, Take 1 tablet (25 mg total) by mouth daily., Disp: 90 tablet, Rfl: 1   insulin NPH-regular Human (NOVOLIN 70/30) (70-30) 100 UNIT/ML injection, Inject 15 Units into the skin 2 (two) times daily with a meal., Disp: 10 mL, Rfl: 3   losartan (COZAAR) 25 MG tablet, Take 2 tablets (50 mg total) by mouth at bedtime., Disp: 90 tablet, Rfl: 1   NIFEdipine (PROCARDIA-XL/NIFEDICAL-XL) 30 MG 24 hr tablet, Take 1 tablet (30 mg total) by mouth in the morning and at bedtime., Disp: 60 tablet, Rfl: 2   ondansetron (ZOFRAN ODT) 4 MG disintegrating tablet, Take 1 tablet (4 mg total) by mouth every 8 (eight) hours as needed for nausea or vomiting., Disp: 20 tablet, Rfl: 0   oxyCODONE (OXY IR/ROXICODONE) 5 MG immediate release tablet, Take 1-2 tablets (5-10 mg total) by mouth every 6 (six) hours as needed for moderate pain or severe pain., Disp: 20 tablet, Rfl: 0   pantoprazole (PROTONIX) 40 MG tablet, Take 1 tablet (40 mg total) by mouth 2 (two) times daily., Disp: 60 tablet, Rfl: 3   polyethylene glycol (MIRALAX / GLYCOLAX) 17 g packet, Take 17 g by mouth daily as needed for moderate constipation., Disp: , Rfl:    PRESCRIPTION MEDICATION, Inject 20 Units as directed in the morning and  at bedtime. Rx from [place], [place]: , Rfl:    senna (SENOKOT) 8.6 MG tablet, Take 2 tablets by mouth daily., Disp: , Rfl:  No current facility-administered medications for this visit.  Facility-Administered Medications Ordered in Other Visits:    sodium chloride flush (NS) 0.9 % injection 10 mL, 10 mL, Intracatheter, PRN, Volanda Napoleon, MD, 10 mL at 11/21/20 1542  Allergies:  Allergies  Allergen Reactions   Amoxicillin Swelling and Rash    Daughter is unsure if patient is allergic to Amoxicillin or Clarithromycin.   Bactrim [Sulfamethoxazole-Trimethoprim] Swelling   Clarithromycin Swelling and Rash    Daughter is unsure if Clarithromycin  or Amoxicillin caused rash and swelling.    Past Medical History, Surgical history, Social history, and Family History were reviewed and updated.  Review of Systems: Review of Systems  Constitutional: Negative.   HENT:  Negative.    Eyes: Negative.   Respiratory:  Positive for cough.   Cardiovascular: Negative.   Gastrointestinal:  Positive for abdominal pain.  Endocrine: Negative.   Genitourinary: Negative.    Musculoskeletal: Negative.   Skin: Negative.   Neurological: Negative.   Hematological: Negative.   Psychiatric/Behavioral: Negative.     Physical Exam:  weight is 138 lb (62.6 kg). Her oral temperature is 98 F (36.7 C). Her blood pressure is 117/59 (abnormal) and her pulse is 84. Her respiration is 18 and oxygen saturation is 98%.   Wt Readings from Last 3 Encounters:  11/21/20 138 lb (62.6 kg)  11/10/20 138 lb 12.8 oz (63 kg)  10/13/20 144 lb 12.8 oz (65.7 kg)    Physical Exam Vitals reviewed.  HENT:     Head: Normocephalic and atraumatic.  Eyes:     Pupils: Pupils are equal, round, and reactive to light.  Cardiovascular:     Rate and Rhythm: Normal rate and regular rhythm.     Heart sounds: Normal heart sounds.  Pulmonary:     Effort: Pulmonary effort is normal.     Breath sounds: Normal breath sounds.  Abdominal:     General: Bowel sounds are normal.     Palpations: Abdomen is soft.     Comments: Abdominal exam shows the healing laparotomy scar in the midline.  She has little bit of distention about the laparotomy scar.  She has no fluid wave.  Maybe a little bit of tenderness in the right upper quadrant.  Bowel sounds are present.  There is no obvious liver or spleen tip that is palpable.  Musculoskeletal:        General: No tenderness or deformity. Normal range of motion.     Cervical back: Normal range of motion.  Lymphadenopathy:     Cervical: No cervical adenopathy.  Skin:    General: Skin is warm and dry.     Findings: No erythema or rash.   Neurological:     Mental Status: She is alert and oriented to person, place, and time.  Psychiatric:        Behavior: Behavior normal.        Thought Content: Thought content normal.        Judgment: Judgment normal.     Lab Results  Component Value Date   WBC 5.2 11/21/2020   HGB 12.0 11/21/2020   HCT 36.2 11/21/2020   MCV 85.2 11/21/2020   PLT 175 11/21/2020     Chemistry      Component Value Date/Time   NA 132 (L) 11/21/2020 1227   K 3.9 11/21/2020 1227  CL 102 11/21/2020 1227   CO2 25 11/21/2020 1227   BUN 20 11/21/2020 1227   CREATININE 0.72 11/21/2020 1227      Component Value Date/Time   CALCIUM 10.7 (H) 11/21/2020 1227   ALKPHOS 194 (H) 11/21/2020 1227   AST 51 (H) 11/21/2020 1227   ALT 55 (H) 11/21/2020 1227   BILITOT 1.1 11/21/2020 1227       Impression and Plan: [name] [name] is a very charming 75 year old woman from [place].  She has hepatocellular carcinoma.  She also has Hepatitis C.  We will now get her started on immunotherapy with single agent nivolumab.  Hopefully, this will help.  Hopefully this will not affect the liver function studies.  We will plan in treatment every 2 weeks.  May be, in the future, we will be able to utilize Avastin.  I do still think we can do that now because of the issue that she had with gastric perforation that actually led her to be diagnosed with liver cancer.   Volanda Napoleon, MD 10/7/20224:32 PM

## 2020-11-22 LAB — AFP TUMOR MARKER: AFP, Serum, Tumor Marker: 16.2 ng/mL — ABNORMAL HIGH (ref 0.0–9.2)

## 2020-11-26 ENCOUNTER — Telehealth: Payer: Self-pay | Admitting: *Deleted

## 2020-11-26 ENCOUNTER — Encounter: Payer: Self-pay | Admitting: Hematology & Oncology

## 2020-11-26 NOTE — Telephone Encounter (Signed)
Per 11/21/20 los - scheduled upcoming appointments - confirmed

## 2020-11-26 NOTE — Progress Notes (Signed)
Started new therapy today due to intolerance of last therapy.   Oncology Nurse Navigator Documentation  Oncology Nurse Navigator Flowsheets 11/21/2020  Abnormal Finding Date -  Confirmed Diagnosis Date -  Diagnosis Status -  Planned Course of Treatment -  Phase of Treatment -  Chemotherapy Actual Start Date: -  Navigator Follow Up Date: 12/05/2020  Navigator Follow Up Reason: Follow-up Appointment  Navigator Location [facility]  Referral Date to RadOnc/MedOnc -  Navigator Encounter Type Appt/Treatment Plan Review  Telephone -  Multidisiplinary Clinic Date -  Multidisiplinary Clinic Type -  Treatment Initiated Date -  Patient Visit Type MedOnc  Treatment Phase Active Tx  Barriers/Navigation Needs Coordination of Care;Education  Education -  Interventions None Required  Acuity Level 2-Minimal Needs (1-2 Barriers Identified)  Coordination of Care -  Education Method -  Support Groups/Services Friends and Family  Time Spent with Patient 15

## 2020-12-03 ENCOUNTER — Other Ambulatory Visit: Payer: Self-pay

## 2020-12-03 ENCOUNTER — Encounter: Payer: Self-pay | Admitting: Hematology & Oncology

## 2020-12-04 ENCOUNTER — Other Ambulatory Visit: Payer: Self-pay

## 2020-12-04 ENCOUNTER — Encounter: Payer: Self-pay | Admitting: Hematology & Oncology

## 2020-12-05 ENCOUNTER — Telehealth: Payer: Self-pay | Admitting: *Deleted

## 2020-12-05 ENCOUNTER — Encounter: Payer: Self-pay | Admitting: Family

## 2020-12-05 ENCOUNTER — Other Ambulatory Visit: Payer: Self-pay

## 2020-12-05 ENCOUNTER — Encounter: Payer: Self-pay | Admitting: *Deleted

## 2020-12-05 ENCOUNTER — Inpatient Hospital Stay: Payer: Self-pay

## 2020-12-05 ENCOUNTER — Inpatient Hospital Stay (HOSPITAL_BASED_OUTPATIENT_CLINIC_OR_DEPARTMENT_OTHER): Payer: Self-pay | Admitting: Family

## 2020-12-05 VITALS — BP 144/68 | HR 77 | Temp 98.3°F | Resp 17 | Ht 64.0 in | Wt 137.0 lb

## 2020-12-05 DIAGNOSIS — C220 Liver cell carcinoma: Secondary | ICD-10-CM

## 2020-12-05 LAB — CBC WITH DIFFERENTIAL (CANCER CENTER ONLY)
Abs Immature Granulocytes: 0.01 10*3/uL (ref 0.00–0.07)
Basophils Absolute: 0 10*3/uL (ref 0.0–0.1)
Basophils Relative: 1 %
Eosinophils Absolute: 0.1 10*3/uL (ref 0.0–0.5)
Eosinophils Relative: 2 %
HCT: 37.1 % (ref 36.0–46.0)
Hemoglobin: 12.6 g/dL (ref 12.0–15.0)
Immature Granulocytes: 0 %
Lymphocytes Relative: 40 %
Lymphs Abs: 1.6 10*3/uL (ref 0.7–4.0)
MCH: 28.8 pg (ref 26.0–34.0)
MCHC: 34 g/dL (ref 30.0–36.0)
MCV: 84.9 fL (ref 80.0–100.0)
Monocytes Absolute: 0.3 10*3/uL (ref 0.1–1.0)
Monocytes Relative: 8 %
Neutro Abs: 2 10*3/uL (ref 1.7–7.7)
Neutrophils Relative %: 49 %
Platelet Count: 160 10*3/uL (ref 150–400)
RBC: 4.37 MIL/uL (ref 3.87–5.11)
RDW: 19.1 % — ABNORMAL HIGH (ref 11.5–15.5)
WBC Count: 4.1 10*3/uL (ref 4.0–10.5)
nRBC: 0 % (ref 0.0–0.2)

## 2020-12-05 LAB — CMP (CANCER CENTER ONLY)
ALT: 88 U/L — ABNORMAL HIGH (ref 0–44)
AST: 95 U/L — ABNORMAL HIGH (ref 15–41)
Albumin: 3.8 g/dL (ref 3.5–5.0)
Alkaline Phosphatase: 207 U/L — ABNORMAL HIGH (ref 38–126)
Anion gap: 8 (ref 5–15)
BUN: 13 mg/dL (ref 8–23)
CO2: 26 mmol/L (ref 22–32)
Calcium: 10.7 mg/dL — ABNORMAL HIGH (ref 8.9–10.3)
Chloride: 105 mmol/L (ref 98–111)
Creatinine: 0.6 mg/dL (ref 0.44–1.00)
GFR, Estimated: >60 mL/min (ref >60)
Glucose, Bld: 117 mg/dL — ABNORMAL HIGH (ref 70–99)
Potassium: 3.5 mmol/L (ref 3.5–5.1)
Sodium: 139 mmol/L (ref 135–145)
Total Bilirubin: 1.3 mg/dL — ABNORMAL HIGH (ref 0.3–1.2)
Total Protein: 8.1 g/dL (ref 6.5–8.1)

## 2020-12-05 MED ORDER — HEPARIN SOD (PORK) LOCK FLUSH 100 UNIT/ML IV SOLN
500.0000 [IU] | Freq: Once | INTRAVENOUS | Status: AC | PRN
  Administered 2020-12-05: 500 [IU]

## 2020-12-05 MED ORDER — SODIUM CHLORIDE 0.9 % IV SOLN: Freq: Once | INTRAVENOUS | Status: AC

## 2020-12-05 MED ORDER — SODIUM CHLORIDE 0.9% FLUSH
10.0000 mL | INTRAVENOUS | Status: DC | PRN
  Administered 2020-12-05: 10 mL

## 2020-12-05 MED ORDER — SODIUM CHLORIDE 0.9 % IV SOLN
240.0000 mg | Freq: Once | INTRAVENOUS | Status: AC
  Administered 2020-12-05: 240 mg via INTRAVENOUS
  Filled 2020-12-05: qty 24

## 2020-12-05 NOTE — Patient Instructions (Signed)
[facility]  Discharge Instructions: Thank you for choosing [facility] to provide your oncology and hematology care.   If you have a lab appointment with the Cancer Center, please go directly to the Cancer Center and check in at the registration area.  Wear comfortable clothing and clothing appropriate for easy access to any Portacath or PICC line.   We strive to give you quality time with your provider. You may need to reschedule your appointment if you arrive late (15 or more minutes).  Arriving late affects you and other patients whose appointments are after yours.  Also, if you miss three or more appointments without notifying the office, you may be dismissed from the clinic at the provider's discretion.      For prescription refill requests, have your pharmacy contact our office and allow 72 hours for refills to be completed.    Today you received the following chemotherapy and/or immunotherapy agents:  Opdivo      To help prevent nausea and vomiting after your treatment, we encourage you to take your nausea medication as directed.  BELOW ARE SYMPTOMS THAT SHOULD BE REPORTED IMMEDIATELY: *FEVER GREATER THAN 100.4 F (38 C) OR HIGHER *CHILLS OR SWEATING *NAUSEA AND VOMITING THAT IS NOT CONTROLLED WITH YOUR NAUSEA MEDICATION *UNUSUAL SHORTNESS OF BREATH *UNUSUAL BRUISING OR BLEEDING *URINARY PROBLEMS (pain or burning when urinating, or frequent urination) *BOWEL PROBLEMS (unusual diarrhea, constipation, pain near the anus) TENDERNESS IN MOUTH AND THROAT WITH OR WITHOUT PRESENCE OF ULCERS (sore throat, sores in mouth, or a toothache) UNUSUAL RASH, SWELLING OR PAIN  UNUSUAL VAGINAL DISCHARGE OR ITCHING   Items with * indicate a potential emergency and should be followed up as soon as possible or go to the Emergency Department if any problems should occur.  Please show the CHEMOTHERAPY ALERT CARD or IMMUNOTHERAPY ALERT CARD at check-in to the  Emergency Department and triage nurse. Should you have questions after your visit or need to cancel or reschedule your appointment, please contact [facility]  [phone] and follow the prompts.  Office hours are 8:00 a.m. to 4:30 p.m. Monday - Friday. Please note that voicemails left after 4:00 p.m. may not be returned until the following business day.  We are closed weekends and major holidays. You have access to a nurse at all times for urgent questions. Please call the main number to the clinic [phone] and follow the prompts.  For any non-urgent questions, you may also contact your provider using MyChart. We now offer e-Visits for anyone 18 and older to request care online for non-urgent symptoms. For details visit [email or web address].   Also download the MyChart app! Go to the app store, search "MyChart", open the app, select , and log in with your MyChart username and password.  Due to Covid, a mask is required upon entering the hospital/clinic. If you do not have a mask, one will be given to you upon arrival. For doctor visits, patients may have 1 support person aged 18 or older with them. For treatment visits, patients cannot have anyone with them due to current Covid guidelines and our immunocompromised population.  

## 2020-12-05 NOTE — Patient Instructions (Signed)
Implanted Port Home Guide An implanted port is a device that is placed under the skin. It is usually placed in the chest. The device can be used to give IV medicine, to take blood, or for dialysis. You may have an implanted port if: You need IV medicine that would be irritating to the small veins in your hands or arms. You need IV medicines, such as antibiotics, for a long period of time. You need IV nutrition for a long period of time. You need dialysis. When you have a port, your health care provider can choose to use the port instead of veins in your arms for these procedures. You may have fewer limitations when using a port than you would if you used other types of long-term IVs, and you will likely be able to return to normal activities after your incision heals. An implanted port has two main parts: Reservoir. The reservoir is the part where a needle is inserted to give medicines or draw blood. The reservoir is round. After it is placed, it appears as a small, raised area under your skin. Catheter. The catheter is a thin, flexible tube that connects the reservoir to a vein. Medicine that is inserted into the reservoir goes into the catheter and then into the vein. How is my port accessed? To access your port: A numbing cream may be placed on the skin over the port site. Your health care provider will put on a mask and sterile gloves. The skin over your port will be cleaned carefully with a germ-killing soap and allowed to dry. Your health care provider will gently pinch the port and insert a needle into it. Your health care provider will check for a blood return to make sure the port is in the vein and is not clogged. If your port needs to remain accessed to get medicine continuously (constant infusion), your health care provider will place a clear bandage (dressing) over the needle site. The dressing and needle will need to be changed every week, or as told by your health care provider. What  is flushing? Flushing helps keep the port from getting clogged. Follow instructions from your health care provider about how and when to flush the port. Ports are usually flushed with saline solution or a medicine called heparin. The need for flushing will depend on how the port is used: If the port is only used from time to time to give medicines or draw blood, the port may need to be flushed: Before and after medicines have been given. Before and after blood has been drawn. As part of routine maintenance. Flushing may be recommended every 4-6 weeks. If a constant infusion is running, the port may not need to be flushed. Throw away any syringes in a disposal container that is meant for sharp items (sharps container). You can buy a sharps container from a pharmacy, or you can make one by using an empty hard plastic bottle with a cover. How long will my port stay implanted? The port can stay in for as long as your health care provider thinks it is needed. When it is time for the port to come out, a surgery will be done to remove it. The surgery will be similar to the procedure that was done to put the port in. Follow these instructions at home:  Flush your port as told by your health care provider. If you need an infusion over several days, follow instructions from your health care provider about how   to take care of your port site. Make sure you: Wash your hands with soap and water before you change your dressing. If soap and water are not available, use alcohol-based hand sanitizer. Change your dressing as told by your health care provider. Place any used dressings or infusion bags into a plastic bag. Throw that bag in the trash. Keep the dressing that covers the needle clean and dry. Do not get it wet. Do not use scissors or sharp objects near the tube. Keep the tube clamped, unless it is being used. Check your port site every day for signs of infection. Check for: Redness, swelling, or  pain. Fluid or blood. Pus or a bad smell. Protect the skin around the port site. Avoid wearing bra straps that rub or irritate the site. Protect the skin around your port from seat belts. Place a soft pad over your chest if needed. Bathe or shower as told by your health care provider. The site may get wet as long as you are not actively receiving an infusion. Return to your normal activities as told by your health care provider. Ask your health care provider what activities are safe for you. Carry a medical alert card or wear a medical alert bracelet at all times. This will let health care providers know that you have an implanted port in case of an emergency. Get help right away if: You have redness, swelling, or pain at the port site. You have fluid or blood coming from your port site. You have pus or a bad smell coming from the port site. You have a fever. Summary Implanted ports are usually placed in the chest for long-term IV access. Follow instructions from your health care provider about flushing the port and changing bandages (dressings). Take care of the area around your port by avoiding clothing that puts pressure on the area, and by watching for signs of infection. Protect the skin around your port from seat belts. Place a soft pad over your chest if needed. Get help right away if you have a fever or you have redness, swelling, pain, drainage, or a bad smell at the port site. This information is not intended to replace advice given to you by your health care provider. Make sure you discuss any questions you have with your health care provider. Document Revised: 04/23/2020 Document Reviewed: 06/18/2019 Elsevier Patient Education  2022 [employer] Inc.  

## 2020-12-05 NOTE — Telephone Encounter (Signed)
Per 12/05/20 los gave upcoming appointments - confirmed

## 2020-12-05 NOTE — Progress Notes (Signed)
Hematology and Oncology Follow Up Visit  [name] [name] [id number] [birth year] 75 y.o. 12/05/2020   Principle Diagnosis:  Hepatocellular carcinoma-multifocal Hepatitis C   Current Therapy:        Status post cycle 1 of nivolumab/ipilimumab -- d/c on 10/13/2020 due to hepatic toxicity Single agent Nivolumab 240 mg IV every 2 weeks - started 11/21/2020   Interim History:  [name] is here today for follow-up and treatment. She is doing fairly well. She has been eating well and is doing her best to stay well hydrated. Her weight is 137 lbs, down 1 lb since her last visit. Her blood glucose today is 117 which is much better. She has been taking her insulin as prescribed. She held this morning for blood sugar in the 80's but has with her to take now.  No fever, chills, n/v, cough, rash, dizziness, SOB, chest pain, abdominal pain or changes in bowel or bladder habits.  She has episodes of palpitations which she feels is due to anxiety and stress over family issues.  No swelling or tenderness in her extremities.  She has tingling in the toes of her left foot that is tolerable at this time. Her Gabapentin is on hold due to her elevated LFT's.  AST is 95, ALT 88 and Alk phos 207.  AFP 2 weeks ago was 16.2.  No falls or syncope to report.  She has not noted any blood loss. No bruising or petechiae.   ECOG Performance Status: 1 - Symptomatic but completely ambulatory  Medications:  Allergies as of 12/05/2020       Reactions   Amoxicillin Swelling, Rash   Daughter is unsure if patient is allergic to Amoxicillin or Clarithromycin.   Bactrim [sulfamethoxazole-trimethoprim] Swelling   Clarithromycin Swelling, Rash   Daughter is unsure if Clarithromycin or Amoxicillin caused rash and swelling.        Medication List        Accurate as of December 05, 2020  8:55 AM. If you have any questions, ask your nurse or doctor.          acetaminophen 325 MG  tablet Commonly known as: TYLENOL Take 2 tablets (650 mg total) by mouth every 6 (six) hours as needed for mild pain, moderate pain or fever.   brimonidine 0.1 % Soln Commonly known as: ALPHAGAN P Place 1 drop into both eyes in the morning and at bedtime.   brimonidine 0.2 % ophthalmic solution Commonly known as: ALPHAGAN 1 drop 2 (two) times daily.   diphenhydramine-acetaminophen 25-500 MG Tabs tablet Commonly known as: TYLENOL PM Take 1 tablet by mouth at bedtime as needed (sleep).   gabapentin 100 MG capsule Commonly known as: NEURONTIN Take 1 capsule (100 mg total) by mouth 3 (three) times daily.   hydrochlorothiazide 25 MG tablet Commonly known as: HYDRODIURIL Take 1 tablet (25 mg total) by mouth daily.   losartan 25 MG tablet Commonly known as: COZAAR Take 2 tablets (50 mg total) by mouth at bedtime.   NIFEdipine 30 MG 24 hr tablet Commonly known as: PROCARDIA-XL/NIFEDICAL-XL Take 1 tablet (30 mg total) by mouth in the morning and at bedtime.   NovoLIN 70/30 (70-30) 100 UNIT/ML injection Generic drug: insulin NPH-regular Human Inject 15 Units into the skin 2 (two) times daily with a meal.   ondansetron 4 MG disintegrating tablet Commonly known as: Zofran ODT Take 1 tablet (4 mg total) by mouth every 8 (eight) hours as needed for nausea or vomiting.   oxyCODONE 5  MG immediate release tablet Commonly known as: Oxy IR/ROXICODONE Take 1-2 tablets (5-10 mg total) by mouth every 6 (six) hours as needed for moderate pain or severe pain.   pantoprazole 40 MG tablet Commonly known as: Protonix Take 1 tablet (40 mg total) by mouth 2 (two) times daily.   polyethylene glycol 17 g packet Commonly known as: MIRALAX / GLYCOLAX Take 17 g by mouth daily as needed for moderate constipation.   PRESCRIPTION MEDICATION Inject 20 Units as directed in the morning and at bedtime. Rx from [place]   senna 8.6 MG tablet Commonly known as: SENOKOT Take 2 tablets by mouth daily.         Allergies:  Allergies  Allergen Reactions   Amoxicillin Swelling and Rash    Daughter is unsure if patient is allergic to Amoxicillin or Clarithromycin.   Bactrim [Sulfamethoxazole-Trimethoprim] Swelling   Clarithromycin Swelling and Rash    Daughter is unsure if Clarithromycin or Amoxicillin caused rash and swelling.    Past Medical History, Surgical history, Social history, and Family History were reviewed and updated.  Review of Systems: All other 10 point review of systems is negative.   Physical Exam:  vitals were not taken for this visit.   Wt Readings from Last 3 Encounters:  11/21/20 138 lb (62.6 kg)  11/10/20 138 lb 12.8 oz (63 kg)  10/13/20 144 lb 12.8 oz (65.7 kg)    Ocular: Sclerae unicteric, pupils equal, round and reactive to light Ear-nose-throat: Oropharynx clear, dentition fair Lymphatic: No cervical or supraclavicular adenopathy Lungs no rales or rhonchi, good excursion bilaterally Heart regular rate and rhythm, no murmur appreciated Abd soft, nontender, positive bowel sounds, no liver or spleen tip palpated ib exam, no fluid wave  MSK no focal spinal tenderness, no joint edema Neuro: non-focal, well-oriented, appropriate affect Breasts: Deferred   Lab Results  Component Value Date   WBC 5.2 11/21/2020   HGB 12.0 11/21/2020   HCT 36.2 11/21/2020   MCV 85.2 11/21/2020   PLT 175 11/21/2020   Lab Results  Component Value Date   FERRITIN 603 (H) 10/02/2020   IRON 31 (L) 10/02/2020   TIBC 241 10/02/2020   UIBC 211 10/02/2020   IRONPCTSAT 13 (L) 10/02/2020   Lab Results  Component Value Date   RBC 4.25 11/21/2020   No results found for: KPAFRELGTCHN, LAMBDASER, KAPLAMBRATIO No results found for: IGGSERUM, IGA, IGMSERUM No results found for: Ronnald Ramp, A1GS, A2GS, Arnaldo Natal, GAMS, MSPIKE, SPEI   Chemistry      Component Value Date/Time   NA 132 (L) 11/21/2020 1227   K 3.9 11/21/2020 1227   CL 102 11/21/2020 1227    CO2 25 11/21/2020 1227   BUN 20 11/21/2020 1227   CREATININE 0.72 11/21/2020 1227      Component Value Date/Time   CALCIUM 10.7 (H) 11/21/2020 1227   ALKPHOS 194 (H) 11/21/2020 1227   AST 51 (H) 11/21/2020 1227   ALT 55 (H) 11/21/2020 1227   BILITOT 1.1 11/21/2020 1227       Impression and Plan: [name] is a very pleasant 75 yo female from [place] with hepatocellular carcinoma and Hepatitis C.  I discussed patient and today's lab work with Dr. Marin Olp. We will proceed with Opdivo treatment today as planned.  We will hold off on repeating her MRI for now until she has had several treatments (unless symptomatic). AFP pending.  Follow-up in 2 weeks.  They can contact our office with any questions or concerns.  Lottie Dawson, NP 10/21/20228:55 AM

## 2020-12-05 NOTE — Progress Notes (Signed)
Oncology Nurse Navigator Documentation  Oncology Nurse Navigator Flowsheets 12/05/2020  Abnormal Finding Date -  Confirmed Diagnosis Date -  Diagnosis Status -  Planned Course of Treatment -  Phase of Treatment -  Chemotherapy Actual Start Date: -  Navigator Follow Up Date: 12/18/2020  Navigator Follow Up Reason: Follow-up Appointment;Chemotherapy  Navigator Location [facility]  Referral Date to RadOnc/MedOnc -  Navigator Encounter Type Appt/Treatment Plan Review  Telephone -  Multidisiplinary Clinic Date -  Multidisiplinary Clinic Type -  Treatment Initiated Date -  Patient Visit Type MedOnc  Treatment Phase Active Tx  Barriers/Navigation Needs Language/Communication  Education -  Interventions None Required  Acuity Level 2-Minimal Needs (1-2 Barriers Identified)  Coordination of Care -  Education Method -  Support Groups/Services Friends and Family  Time Spent with Patient 15

## 2020-12-06 LAB — AFP TUMOR MARKER: AFP, Serum, Tumor Marker: 13.5 ng/mL — ABNORMAL HIGH (ref 0.0–9.2)

## 2020-12-08 NOTE — Addendum Note (Signed)
Addended by: Karle Plumber B on: 12/08/2020 06:08 PM   Modules accepted: Orders

## 2020-12-15 ENCOUNTER — Other Ambulatory Visit (INDEPENDENT_AMBULATORY_CARE_PROVIDER_SITE_OTHER): Payer: Self-pay | Admitting: Primary Care

## 2020-12-15 ENCOUNTER — Other Ambulatory Visit: Payer: Self-pay

## 2020-12-15 ENCOUNTER — Encounter: Payer: Self-pay | Admitting: Hematology & Oncology

## 2020-12-15 DIAGNOSIS — I10 Essential (primary) hypertension: Secondary | ICD-10-CM

## 2020-12-15 NOTE — Telephone Encounter (Signed)
Sent to PCP ?

## 2020-12-16 ENCOUNTER — Encounter: Payer: Self-pay | Admitting: Hematology & Oncology

## 2020-12-16 ENCOUNTER — Other Ambulatory Visit: Payer: Self-pay

## 2020-12-16 MED ORDER — LOSARTAN POTASSIUM 50 MG PO TABS
50.0000 mg | ORAL_TABLET | Freq: Every day | ORAL | 6 refills | Status: DC
  Filled 2020-12-16: qty 30, 30d supply, fill #0
  Filled 2021-01-16: qty 30, 30d supply, fill #1
  Filled 2021-02-05: qty 30, 30d supply, fill #2
  Filled 2021-03-15: qty 30, 30d supply, fill #3
  Filled 2021-03-16: qty 30, 30d supply, fill #0
  Filled 2021-03-24: qty 30, 30d supply, fill #1
  Filled 2021-06-15: qty 30, 30d supply, fill #2
  Filled 2021-07-06 – 2021-07-14 (×2): qty 30, 30d supply, fill #3

## 2020-12-16 NOTE — Addendum Note (Signed)
Addended by: Karle Plumber B on: 12/16/2020 01:57 PM   Modules accepted: Orders

## 2020-12-18 ENCOUNTER — Inpatient Hospital Stay: Payer: Self-pay

## 2020-12-18 ENCOUNTER — Other Ambulatory Visit: Payer: Self-pay

## 2020-12-18 ENCOUNTER — Other Ambulatory Visit (HOSPITAL_BASED_OUTPATIENT_CLINIC_OR_DEPARTMENT_OTHER): Payer: Self-pay

## 2020-12-18 ENCOUNTER — Encounter: Payer: Self-pay | Admitting: Family

## 2020-12-18 ENCOUNTER — Inpatient Hospital Stay (HOSPITAL_BASED_OUTPATIENT_CLINIC_OR_DEPARTMENT_OTHER): Payer: Self-pay | Admitting: Family

## 2020-12-18 ENCOUNTER — Encounter: Payer: Self-pay | Admitting: *Deleted

## 2020-12-18 ENCOUNTER — Inpatient Hospital Stay: Payer: Self-pay | Attending: Hematology & Oncology

## 2020-12-18 ENCOUNTER — Encounter: Payer: Self-pay | Admitting: Hematology & Oncology

## 2020-12-18 VITALS — BP 115/55 | HR 78 | Temp 97.6°F | Resp 16 | Ht 64.0 in | Wt 139.0 lb

## 2020-12-18 DIAGNOSIS — Z23 Encounter for immunization: Secondary | ICD-10-CM | POA: Insufficient documentation

## 2020-12-18 DIAGNOSIS — Z5112 Encounter for antineoplastic immunotherapy: Secondary | ICD-10-CM | POA: Insufficient documentation

## 2020-12-18 DIAGNOSIS — C220 Liver cell carcinoma: Secondary | ICD-10-CM

## 2020-12-18 DIAGNOSIS — Z95828 Presence of other vascular implants and grafts: Secondary | ICD-10-CM

## 2020-12-18 LAB — CBC WITH DIFFERENTIAL (CANCER CENTER ONLY)
Abs Immature Granulocytes: 0 10*3/uL (ref 0.00–0.07)
Basophils Absolute: 0 10*3/uL (ref 0.0–0.1)
Basophils Relative: 1 %
Eosinophils Absolute: 0.1 10*3/uL (ref 0.0–0.5)
Eosinophils Relative: 2 %
HCT: 35.3 % — ABNORMAL LOW (ref 36.0–46.0)
Hemoglobin: 11.8 g/dL — ABNORMAL LOW (ref 12.0–15.0)
Immature Granulocytes: 0 %
Lymphocytes Relative: 35 %
Lymphs Abs: 1.3 10*3/uL (ref 0.7–4.0)
MCH: 29.2 pg (ref 26.0–34.0)
MCHC: 33.4 g/dL (ref 30.0–36.0)
MCV: 87.4 fL (ref 80.0–100.0)
Monocytes Absolute: 0.3 10*3/uL (ref 0.1–1.0)
Monocytes Relative: 7 %
Neutro Abs: 2 10*3/uL (ref 1.7–7.7)
Neutrophils Relative %: 55 %
Platelet Count: 142 10*3/uL — ABNORMAL LOW (ref 150–400)
RBC: 4.04 MIL/uL (ref 3.87–5.11)
RDW: 18.9 % — ABNORMAL HIGH (ref 11.5–15.5)
WBC Count: 3.7 10*3/uL — ABNORMAL LOW (ref 4.0–10.5)
nRBC: 0 % (ref 0.0–0.2)

## 2020-12-18 LAB — CMP (CANCER CENTER ONLY)
ALT: 101 U/L — ABNORMAL HIGH (ref 0–44)
AST: 117 U/L — ABNORMAL HIGH (ref 15–41)
Albumin: 3.6 g/dL (ref 3.5–5.0)
Alkaline Phosphatase: 199 U/L — ABNORMAL HIGH (ref 38–126)
Anion gap: 6 (ref 5–15)
BUN: 15 mg/dL (ref 8–23)
CO2: 26 mmol/L (ref 22–32)
Calcium: 10.8 mg/dL — ABNORMAL HIGH (ref 8.9–10.3)
Chloride: 105 mmol/L (ref 98–111)
Creatinine: 0.65 mg/dL (ref 0.44–1.00)
GFR, Estimated: >60 mL/min
Glucose, Bld: 178 mg/dL — ABNORMAL HIGH (ref 70–99)
Potassium: 3.9 mmol/L (ref 3.5–5.1)
Sodium: 137 mmol/L (ref 135–145)
Total Bilirubin: 1.2 mg/dL (ref 0.3–1.2)
Total Protein: 7.3 g/dL (ref 6.5–8.1)

## 2020-12-18 LAB — LACTATE DEHYDROGENASE: LDH: 170 U/L (ref 98–192)

## 2020-12-18 MED ORDER — INFLUENZA VAC A&B SA ADJ QUAD 0.5 ML IM PRSY
PREFILLED_SYRINGE | INTRAMUSCULAR | 0 refills | Status: DC
  Filled 2020-12-18: qty 0.5, 1d supply, fill #0

## 2020-12-18 MED ORDER — HEPARIN SOD (PORK) LOCK FLUSH 100 UNIT/ML IV SOLN
500.0000 [IU] | Freq: Once | INTRAVENOUS | Status: AC
  Administered 2020-12-18: 500 [IU] via INTRAVENOUS

## 2020-12-18 MED ORDER — SODIUM CHLORIDE 0.9% FLUSH
10.0000 mL | INTRAVENOUS | Status: AC | PRN
  Administered 2020-12-18: 10 mL via INTRAVENOUS

## 2020-12-18 NOTE — Progress Notes (Signed)
Treatment held today. Will follow up in one week for either continued treatment or change of regimen.   Oncology Nurse Navigator Documentation  Oncology Nurse Navigator Flowsheets 12/18/2020  Abnormal Finding Date -  Confirmed Diagnosis Date -  Diagnosis Status -  Planned Course of Treatment -  Phase of Treatment -  Chemotherapy Actual Start Date: -  Navigator Follow Up Date: 12/26/2020  Navigator Follow Up Reason: Follow-up Appointment  Navigator Location [facility]  Referral Date to RadOnc/MedOnc -  Navigator Encounter Type Appt/Treatment Plan Review  Telephone -  Multidisiplinary Clinic Date -  Multidisiplinary Clinic Type -  Treatment Initiated Date -  Patient Visit Type MedOnc  Treatment Phase Active Tx  Barriers/Navigation Needs Coordination of Care;Education;Language/Communication  Education -  Interventions None Required  Acuity Level 2-Minimal Needs (1-2 Barriers Identified)  Coordination of Care -  Education Method -  Support Groups/Services Friends and Family  Time Spent with Patient 15

## 2020-12-18 NOTE — Addendum Note (Signed)
Addended by: Melton Krebs on: 12/18/2020 10:17 AM   Modules accepted: Orders, SmartSet

## 2020-12-18 NOTE — Progress Notes (Signed)
Hematology and Oncology Follow Up Visit  Alfrieda Tarry [id number] October 23, 1945 75 y.o. 12/18/2020   Principle Diagnosis:  Hepatocellular carcinoma-multifocal Hepatitis C   Current Therapy:        Status post cycle 1 of nivolumab/ipilimumab -- d/c on 10/13/2020 due to hepatic toxicity Single agent Nivolumab 240 mg IV every 2 weeks - started 11/21/2020   Interim History:  Ms. Metha Kolasa is here today with her daughter for follow-up and treatment. She denies fatigue at this time.  Unfortunately, her LFT's have gone up. AST 117, ALT 101 and alk phos 199. AFP marker was 13.5 at last visit. Today's result is pending.  No fever, chills, n/v, cough, rash, dizziness, SOB, chest pain, abdominal pain/bloating or changes in bowel or bladder habits.  No swelling or tenderness in her extremities at this time.  The tingling in his toes is unchanged from baseline.  No falls or syncope to report.  She states that she is eating well and staying properly hydrated throughout the day.  They live on the third floor so she is getting exercise going up and down the stairs.  She occasionally noted palpitations with over exertion(stairs) but this resolves with taking a break to rest.   ECOG Performance Status: 1 - Symptomatic but completely ambulatory  Medications:  Allergies as of 12/18/2020       Reactions   Amoxicillin Swelling, Rash   Daughter is unsure if patient is allergic to Amoxicillin or Clarithromycin.   Bactrim [sulfamethoxazole-trimethoprim] Swelling   Clarithromycin Swelling, Rash   Daughter is unsure if Clarithromycin or Amoxicillin caused rash and swelling.        Medication List        Accurate as of December 18, 2020  9:53 AM. If you have any questions, ask your nurse or doctor.          acetaminophen 325 MG tablet Commonly known as: TYLENOL Take 2 tablets (650 mg total) by mouth every 6 (six) hours as needed for mild pain, moderate pain or fever.    brimonidine 0.1 % Soln Commonly known as: ALPHAGAN P Place 1 drop into both eyes in the morning and at bedtime.   brimonidine 0.2 % ophthalmic solution Commonly known as: ALPHAGAN 1 drop 2 (two) times daily.   diphenhydramine-acetaminophen 25-500 MG Tabs tablet Commonly known as: TYLENOL PM Take 1 tablet by mouth at bedtime as needed (sleep).   gabapentin 100 MG capsule Commonly known as: NEURONTIN Take 1 capsule (100 mg total) by mouth 3 (three) times daily.   hydrochlorothiazide 25 MG tablet Commonly known as: HYDRODIURIL Take 1 tablet (25 mg total) by mouth daily.   losartan 50 MG tablet Commonly known as: COZAAR Take 1 tablet (50 mg total) by mouth at bedtime.   NIFEdipine 30 MG 24 hr tablet Commonly known as: PROCARDIA-XL/NIFEDICAL-XL Take 1 tablet (30 mg total) by mouth in the morning and at bedtime.   NovoLIN 70/30 (70-30) 100 UNIT/ML injection Generic drug: insulin NPH-regular Human Inject 15 Units into the skin 2 (two) times daily with a meal.   ondansetron 4 MG disintegrating tablet Commonly known as: Zofran ODT Take 1 tablet (4 mg total) by mouth every 8 (eight) hours as needed for nausea or vomiting.   oxyCODONE 5 MG immediate release tablet Commonly known as: Oxy IR/ROXICODONE Take 1-2 tablets (5-10 mg total) by mouth every 6 (six) hours as needed for moderate pain or severe pain.   pantoprazole 40 MG tablet Commonly known as: Protonix Take 1 tablet (  40 mg total) by mouth 2 (two) times daily.   polyethylene glycol 17 g packet Commonly known as: MIRALAX / GLYCOLAX Take 17 g by mouth daily as needed for moderate constipation.   PRESCRIPTION MEDICATION Inject 20 Units as directed in the morning and at bedtime. Rx from [place]   senna 8.6 MG tablet Commonly known as: SENOKOT Take 2 tablets by mouth daily.        Allergies:  Allergies  Allergen Reactions   Amoxicillin Swelling and Rash    Daughter is unsure if patient is allergic to  Amoxicillin or Clarithromycin.   Bactrim [Sulfamethoxazole-Trimethoprim] Swelling   Clarithromycin Swelling and Rash    Daughter is unsure if Clarithromycin or Amoxicillin caused rash and swelling.    Past Medical History, Surgical history, Social history, and Family History were reviewed and updated.  Review of Systems: All other 10 point review of systems is negative.   Physical Exam:  height is '5\' 4"'  (1.626 m) and weight is 139 lb (63 kg). Her oral temperature is 97.6 F (36.4 C). Her blood pressure is 115/55 (abnormal) and her pulse is 78. Her respiration is 16 and oxygen saturation is 100%.   Wt Readings from Last 3 Encounters:  12/18/20 139 lb (63 kg)  12/05/20 137 lb (62.1 kg)  11/21/20 138 lb (62.6 kg)    Ocular: Sclerae unicteric, pupils equal, round and reactive to light Ear-nose-throat: Oropharynx clear, dentition fair Lymphatic: No cervical or supraclavicular adenopathy Lungs no rales or rhonchi, good excursion bilaterally Heart regular rate and rhythm, no murmur appreciated Abd soft, nontender, positive bowel sounds, no liver or spleen tip palpated on exam, no fluid wave MSK no focal spinal tenderness, no joint edema Neuro: non-focal, well-oriented, appropriate affect Breasts: Deferred   Lab Results  Component Value Date   WBC 3.7 (L) 12/18/2020   HGB 11.8 (L) 12/18/2020   HCT 35.3 (L) 12/18/2020   MCV 87.4 12/18/2020   PLT 142 (L) 12/18/2020   Lab Results  Component Value Date   FERRITIN 603 (H) 10/02/2020   IRON 31 (L) 10/02/2020   TIBC 241 10/02/2020   UIBC 211 10/02/2020   IRONPCTSAT 13 (L) 10/02/2020   Lab Results  Component Value Date   RBC 4.04 12/18/2020   No results found for: KPAFRELGTCHN, LAMBDASER, KAPLAMBRATIO No results found for: IGGSERUM, IGA, IGMSERUM No results found for: Odetta Pink, SPEI   Chemistry      Component Value Date/Time   NA 139 12/05/2020 0857   K 3.5 12/05/2020  0857   CL 105 12/05/2020 0857   CO2 26 12/05/2020 0857   BUN 13 12/05/2020 0857   CREATININE 0.60 12/05/2020 0857      Component Value Date/Time   CALCIUM 10.7 (H) 12/05/2020 0857   ALKPHOS 207 (H) 12/05/2020 0857   AST 95 (H) 12/05/2020 0857   ALT 88 (H) 12/05/2020 0857   BILITOT 1.3 (H) 12/05/2020 0857       Impression and Plan: Ms. Kayren Holck is a very pleasant 75 yo female from [place] with hepatocellular carcinoma and Hepatitis C.  Labs and patient status discussed with Dr. Marin Olp.  We will hold treatment this week and have her come back in next week. If her LFT's have not improved at that time, we will look at adjusting her treatment plan.  Patient verbalized understanding and agreement with the plan.  Follow-up in 1 week.  They were encouraged to contact our office with any questions or  concerns.   Lottie Dawson, NP 11/3/20229:53 AM

## 2020-12-19 ENCOUNTER — Other Ambulatory Visit: Payer: Self-pay

## 2020-12-19 LAB — AFP TUMOR MARKER: AFP, Serum, Tumor Marker: 14.7 ng/mL — ABNORMAL HIGH (ref 0.0–9.2)

## 2020-12-25 ENCOUNTER — Encounter: Payer: Self-pay | Admitting: Internal Medicine

## 2020-12-25 ENCOUNTER — Ambulatory Visit: Payer: Self-pay | Attending: Internal Medicine | Admitting: Internal Medicine

## 2020-12-25 ENCOUNTER — Other Ambulatory Visit: Payer: Self-pay

## 2020-12-25 VITALS — BP 126/80 | HR 80 | Resp 16 | Wt 139.0 lb

## 2020-12-25 DIAGNOSIS — B182 Chronic viral hepatitis C: Secondary | ICD-10-CM

## 2020-12-25 DIAGNOSIS — Z1231 Encounter for screening mammogram for malignant neoplasm of breast: Secondary | ICD-10-CM

## 2020-12-25 DIAGNOSIS — Z23 Encounter for immunization: Secondary | ICD-10-CM

## 2020-12-25 DIAGNOSIS — I152 Hypertension secondary to endocrine disorders: Secondary | ICD-10-CM

## 2020-12-25 DIAGNOSIS — E1142 Type 2 diabetes mellitus with diabetic polyneuropathy: Secondary | ICD-10-CM

## 2020-12-25 DIAGNOSIS — E1159 Type 2 diabetes mellitus with other circulatory complications: Secondary | ICD-10-CM

## 2020-12-25 LAB — POCT GLYCOSYLATED HEMOGLOBIN (HGB A1C): HbA1c, POC (controlled diabetic range): 6.3 % (ref 0.0–7.0)

## 2020-12-25 MED ORDER — NOVOLIN 70/30 (70-30) 100 UNIT/ML ~~LOC~~ SUSP
SUBCUTANEOUS | 3 refills | Status: DC
  Filled 2020-12-25: qty 10, fill #0
  Filled 2021-01-26: qty 10, 33d supply, fill #0
  Filled 2021-02-24: qty 10, 33d supply, fill #1
  Filled 2021-02-24: qty 10, 40d supply, fill #0
  Filled 2021-03-24: qty 10, 40d supply, fill #1
  Filled 2021-04-29: qty 10, 40d supply, fill #2

## 2020-12-25 NOTE — Progress Notes (Signed)
Patient ID: [name] [name], female    DOB: 28-Jul-1945  MRN: [id number]  CC: Diabetes   Subjective: [name] [name] is a 75 y.o. female who presents for chronic ds management. 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   HCC/hepatitis C/history of perforated gastric ulcer:  receiving immunotherapy.  Placed on hold for a wk due to increase LFTs.  Will have f/u tomorrow with lab test to determine whether the immunotherapy can be resumed.. -did not get in with [facility] as yet.  Needs to get records from [facility] first. Still on Pantoprazole twice daily. -She was seen by infectious disease a few months ago.  He recommended waiting until [facility] is treated on the better control with therapy before starting treatment for hepatitis C. -Patient was concerned about her weight.  When she first came to the [place] 6 months ago she was 144 pounds.  She subsequently lost weight posthospitalization but now has regained some of the weight back.  Today she is 139 pounds.[name]  HTN:  HCTZ stopped by her oncologist. Continued with Nifedipine and Cozaar and blood pressure has been controlled.  She checks blood pressure at home intermittently. No LE edema, SOB, CP  DM:   Results for orders placed or performed in visit on 12/25/20  POCT glycosylated hemoglobin (Hb A1C)  Result Value Ref Range   Hemoglobin A1C     HbA1c POC (<> result, manual entry)     HbA1c, POC (prediabetic range)     HbA1c, POC (controlled diabetic range) 6.3 0.0 - 7.0 %   daughter reports patient's blood sugars before dinner have increased over the past 4 days.  No major changes in eating habits.   Checking BS in a.m and evening.  A.m range 80-120 and before dinner 120-150.  However over past  4 days BS before dinner 150-200.  Nothing different in terms of her eating habits Patient is supposed to be on NPH/regular 70/30 insulin 15 units twice a day.  However patient has changed her dosage to 20 units in the morning and 5 units in the evening because blood sugars at nights were bottoming out.  She usually eats a light supper.   Complains of tingling in the toes.  Started after she had surgery for perforated stomach ulcer.  Started on gabapentin by nurse practitioner.  However her oncologist recommended that she hold off on it due to abnormal liver function tests.  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    Patient Active Problem List   Diagnosis Date Noted   Hypercalcemia 09/22/2020   Chronic hepatitis C with hepatic coma ([place]) 09/22/2020   Goals of care, counseling/discussion 09/01/2020   Hepatocellular carcinoma ([place]) 08/14/2020   Hyperglycemia 08/10/2020   Pneumoperitoneum 08/09/2020   History of colorectal cancer 08/09/2020   Liver mass, left lobe 08/09/2020   Insulin-requiring or dependent type II diabetes mellitus ([place]) 08/09/2020   Hypertension associated with diabetes ([place]) 08/09/2020   Constipation, chronic 08/09/2020   Perforated gastric ulcer s/p lap omental Phillip Heal patch 08/09/2020 08/09/2020     Current Outpatient Medications on File Prior to Visit  Medication Sig Dispense Refill   acetaminophen (TYLENOL) 325 MG tablet Take 2 tablets (650 mg  total) by mouth every 6 (six) hours as needed for mild pain, moderate pain or fever. (Patient not taking: Reported on 12/25/2020) 30 tablet 0   brimonidine (ALPHAGAN P) 0.1 % SOLN Place 1 drop into both eyes in the morning and at bedtime.     brimonidine (ALPHAGAN) 0.2 % ophthalmic solution 1 drop 2 (two) times daily.     diphenhydramine-acetaminophen (TYLENOL PM) 25-500 MG TABS tablet Take 1 tablet by mouth at bedtime as needed (sleep). (Patient not  taking: Reported on 12/25/2020)     gabapentin (NEURONTIN) 100 MG capsule Take 1 capsule (100 mg total) by mouth 3 (three) times daily. (Patient not taking: Reported on 12/05/2020) 90 capsule 3   influenza vaccine adjuvanted (FLUAD) 0.5 ML injection Inject into the muscle. 0.5 mL 0   insulin NPH-regular Human (NOVOLIN 70/30) (70-30) 100 UNIT/ML injection Inject 15 Units into the skin 2 (two) times daily with a meal. 10 mL 3   losartan (COZAAR) 50 MG tablet Take 1 tablet (50 mg total) by mouth at bedtime. 30 tablet 6   NIFEdipine (PROCARDIA-XL/NIFEDICAL-XL) 30 MG 24 hr tablet Take 1 tablet (30 mg total) by mouth in the morning and at bedtime. 60 tablet 2   ondansetron (ZOFRAN ODT) 4 MG disintegrating tablet Take 1 tablet (4 mg total) by mouth every 8 (eight) hours as needed for nausea or vomiting. (Patient not taking: Reported on 12/25/2020) 20 tablet 0   oxyCODONE (OXY IR/ROXICODONE) 5 MG immediate release tablet Take 1-2 tablets (5-10 mg total) by mouth every 6 (six) hours as needed for moderate pain or severe pain. (Patient not taking: Reported on 12/25/2020) 20 tablet 0   pantoprazole (PROTONIX) 40 MG tablet Take 1 tablet (40 mg total) by mouth 2 (two) times daily. 60 tablet 3   polyethylene glycol (MIRALAX / GLYCOLAX) 17 g packet Take 17 g by mouth daily as needed for moderate constipation.     PRESCRIPTION MEDICATION Inject 20 Units as directed in the morning and at bedtime. Rx from [place]     senna (SENOKOT) 8.6 MG tablet Take 2 tablets by mouth daily. (Patient not taking: Reported on 12/25/2020)     Current Facility-Administered Medications on File Prior to Visit  Medication Dose Route Frequency Provider Last Rate Last Admin   sodium chloride flush (NS) 0.9 % injection 10 mL  10 mL Intravenous PRN Celso Amy, NP   10 mL at 12/18/20 1009    Allergies  Allergen Reactions   Amoxicillin Swelling and Rash    Daughter is unsure if patient is allergic to Amoxicillin or Clarithromycin.    Bactrim [Sulfamethoxazole-Trimethoprim] Swelling   Clarithromycin Swelling and Rash    Daughter is unsure if Clarithromycin or Amoxicillin caused rash and swelling.    Social History   Socioeconomic History   Marital status: Widowed    Spouse name: Not on file   Number of children: 5   Years of education: 14   Highest education level: Associate degree: occupational, Hotel manager, or vocational program  Occupational History   Not on file  Tobacco Use   Smoking status: Never   Smokeless tobacco: Never  Vaping Use   Vaping Use: Never used  Substance and Sexual Activity   Alcohol use: Not Currently   Drug use: Never   Sexual activity: Not Currently  Other Topics Concern   Not on file  Social History Narrative   Originally from [place].  Speaks [place] and [place]   Social Determinants of Radio broadcast assistant  Strain: Not on file  Food Insecurity: Not on file  Transportation Needs: Not on file  Physical Activity: Not on file  Stress: Not on file  Social Connections: Not on file  Intimate Partner Violence: Not on file    No family history on file.  Past Surgical History:  Procedure Laterality Date   COLON RESECTION  2005   In [place] - ?colon cancer   IR IMAGING GUIDED PORT INSERTION  09/18/2020   LAPAROSCOPIC GASTRIC RESECTION N/A 08/09/2020   Procedure: LAPAROSCOPIC EXPLORATION OF ABDOMEN, PERFORATED ULCER REPAIR, LIVER BIOPSY;  Surgeon: Michael Boston, MD;  Location: [facility];  Service: General;  Laterality: N/A;    ROS: Review of Systems Negative except as stated above  PHYSICAL EXAM: BP 126/80   Pulse 80   Resp 16   Wt 139 lb (63 kg)   SpO2 98%   BMI 23.86 kg/m   This SmartLink has not been configured with any valid records.    Physical Exam  General appearance - alert, well appearing, and in no distress Mental status - normal mood, behavior, speech, dress, motor activity, and thought processes Chest - clear to auscultation, no wheezes, rales or  rhonchi, symmetric air entry Heart - normal rate, regular rhythm, normal S1, S2, no murmurs, rubs, clicks or gallops Extremities - peripheral pulses normal, no pedal edema, no clubbing or cyanosis Diabetic Foot Exam - Simple   Simple Foot Form  12/25/2020  5:37 PM  Visual Inspection See comments: Yes Sensation Testing Intact to touch and monofilament testing bilaterally: Yes Pulse Check Posterior Tibialis and Dorsalis pulse intact bilaterally: Yes Comments Toenails are thick, discolored and some of them are slightly overgrown.      CMP Latest Ref Rng & Units 12/18/2020 12/05/2020 11/21/2020  Glucose 70 - 99 mg/dL 178(H) 117(H) 258(H)  BUN 8 - 23 mg/dL 15 13 20   Creatinine 0.44 - 1.00 mg/dL 0.65 0.60 0.72  Sodium 135 - 145 mmol/L 137 139 132(L)  Potassium 3.5 - 5.1 mmol/L 3.9 3.5 3.9  Chloride 98 - 111 mmol/L 105 105 102  CO2 22 - 32 mmol/L 26 26 25   Calcium 8.9 - 10.3 mg/dL 10.8(H) 10.7(H) 10.7(H)  Total Protein 6.5 - 8.1 g/dL 7.3 8.1 7.7  Total Bilirubin 0.3 - 1.2 mg/dL 1.2 1.3(H) 1.1  Alkaline Phos 38 - 126 U/L 199(H) 207(H) 194(H)  AST 15 - 41 U/L 117(H) 95(H) 51(H)  ALT 0 - 44 U/L 101(H) 88(H) 55(H)   Lipid Panel  No results found for: CHOL, TRIG, HDL, CHOLHDL, VLDL, LDLCALC, LDLDIRECT  CBC    Component Value Date/Time   WBC 3.7 (L) 12/18/2020 0854   WBC 9.8 08/16/2020 0434   RBC 4.04 12/18/2020 0854   HGB 11.8 (L) 12/18/2020 0854   HCT 35.3 (L) 12/18/2020 0854   PLT 142 (L) 12/18/2020 0854   MCV 87.4 12/18/2020 0854   MCH 29.2 12/18/2020 0854   MCHC 33.4 12/18/2020 0854   RDW 18.9 (H) 12/18/2020 0854   LYMPHSABS 1.3 12/18/2020 0854   MONOABS 0.3 12/18/2020 0854   EOSABS 0.1 12/18/2020 0854   BASOSABS 0.0 12/18/2020 0854    ASSESSMENT AND PLAN: 1. Type 2 diabetes mellitus with peripheral neuropathy (HCC) A1c is at goal. I told patient that it is okay for her to adjust her insulin depending on what her blood sugars are doing.  She will continue with 20 units  in the morning and 5 units in the evening. Hold off on starting gabapentin. - POCT glycosylated  hemoglobin (Hb A1C)  2. Hypertension associated with diabetes ([place]) Controlled on nifedipine and Cozaar.  3. Chronic hepatitis C without hepatic coma (HCC) Seen by ID already.  Plan to treat once [facility] is better controlled.  4. Need for influenza vaccination Patient will check with her oncologist about whether she can receive the flu vaccine and Shingrix.  She will come back to see the clinical pharmacist in 1 week to get the flu vaccine if it is okay for [place] to give it.  5. Encounter for screening mammogram for malignant neoplasm of breast Given form for mammogram scholarship.    Patient was given the opportunity to ask questions.  Patient verbalized understanding of the plan and was able to repeat key elements of the plan.   Orders Placed This Encounter  Procedures   POCT glycosylated hemoglobin (Hb A1C)     Requested Prescriptions    No prescriptions requested or ordered in this encounter    Return in about 4 months (around 04/24/2021).  Karle Plumber, MD, FACP

## 2020-12-25 NOTE — Progress Notes (Signed)
Pt states she is having tingling in both feet in the toes

## 2020-12-25 NOTE — Patient Instructions (Signed)
Okay to continue the insulin as 20 units in the mornings and 5 units in the evenings.  Ask your oncologist whether it is okay for you to receive the flu vaccine and Shingrix which is the shingles vaccine.

## 2020-12-26 ENCOUNTER — Encounter: Payer: Self-pay | Admitting: Hematology & Oncology

## 2020-12-26 ENCOUNTER — Other Ambulatory Visit: Payer: Self-pay

## 2020-12-26 ENCOUNTER — Inpatient Hospital Stay: Payer: Self-pay

## 2020-12-26 ENCOUNTER — Inpatient Hospital Stay (HOSPITAL_BASED_OUTPATIENT_CLINIC_OR_DEPARTMENT_OTHER): Payer: Self-pay | Admitting: Hematology & Oncology

## 2020-12-26 ENCOUNTER — Ambulatory Visit: Payer: Self-pay

## 2020-12-26 VITALS — BP 109/64 | HR 80 | Temp 98.1°F | Resp 16 | Wt 137.0 lb

## 2020-12-26 DIAGNOSIS — C220 Liver cell carcinoma: Secondary | ICD-10-CM

## 2020-12-26 DIAGNOSIS — Z95828 Presence of other vascular implants and grafts: Secondary | ICD-10-CM

## 2020-12-26 LAB — CBC WITH DIFFERENTIAL (CANCER CENTER ONLY)
Abs Immature Granulocytes: 0.01 10*3/uL (ref 0.00–0.07)
Basophils Absolute: 0 10*3/uL (ref 0.0–0.1)
Basophils Relative: 1 %
Eosinophils Absolute: 0.1 10*3/uL (ref 0.0–0.5)
Eosinophils Relative: 3 %
HCT: 35.3 % — ABNORMAL LOW (ref 36.0–46.0)
Hemoglobin: 12 g/dL (ref 12.0–15.0)
Immature Granulocytes: 0 %
Lymphocytes Relative: 38 %
Lymphs Abs: 1.5 10*3/uL (ref 0.7–4.0)
MCH: 29.9 pg (ref 26.0–34.0)
MCHC: 34 g/dL (ref 30.0–36.0)
MCV: 88 fL (ref 80.0–100.0)
Monocytes Absolute: 0.3 10*3/uL (ref 0.1–1.0)
Monocytes Relative: 7 %
Neutro Abs: 2 10*3/uL (ref 1.7–7.7)
Neutrophils Relative %: 51 %
Platelet Count: 142 10*3/uL — ABNORMAL LOW (ref 150–400)
RBC: 4.01 MIL/uL (ref 3.87–5.11)
RDW: 17.9 % — ABNORMAL HIGH (ref 11.5–15.5)
WBC Count: 3.9 10*3/uL — ABNORMAL LOW (ref 4.0–10.5)
nRBC: 0 % (ref 0.0–0.2)

## 2020-12-26 LAB — CMP (CANCER CENTER ONLY)
ALT: 87 U/L — ABNORMAL HIGH (ref 0–44)
AST: 95 U/L — ABNORMAL HIGH (ref 15–41)
Albumin: 3.5 g/dL (ref 3.5–5.0)
Alkaline Phosphatase: 194 U/L — ABNORMAL HIGH (ref 38–126)
Anion gap: 7 (ref 5–15)
BUN: 16 mg/dL (ref 8–23)
CO2: 25 mmol/L (ref 22–32)
Calcium: 10.3 mg/dL (ref 8.9–10.3)
Chloride: 104 mmol/L (ref 98–111)
Creatinine: 0.75 mg/dL (ref 0.44–1.00)
GFR, Estimated: >60 mL/min (ref >60)
Glucose, Bld: 164 mg/dL — ABNORMAL HIGH (ref 70–99)
Potassium: 4 mmol/L (ref 3.5–5.1)
Sodium: 136 mmol/L (ref 135–145)
Total Bilirubin: 1.5 mg/dL — ABNORMAL HIGH (ref 0.3–1.2)
Total Protein: 7.1 g/dL (ref 6.5–8.1)

## 2020-12-26 LAB — LACTATE DEHYDROGENASE: LDH: 166 U/L (ref 98–192)

## 2020-12-26 MED ORDER — SODIUM CHLORIDE 0.9% FLUSH
10.0000 mL | Freq: Once | INTRAVENOUS | Status: AC
  Administered 2020-12-26: 10 mL via INTRAVENOUS

## 2020-12-26 MED ORDER — HEPARIN SOD (PORK) LOCK FLUSH 100 UNIT/ML IV SOLN
500.0000 [IU] | Freq: Once | INTRAVENOUS | Status: AC
  Administered 2020-12-26: 500 [IU] via INTRAVENOUS

## 2020-12-26 NOTE — Progress Notes (Signed)
Patient presents for antibody treatment; unfortunately infusion appointment cancelled incidentally last week. Patient receives medication via patient assistance and medication not in stock with Pharmacy. Pt and daughter updated that patient is unable to receive treatment today. Pt rescheduled for next week 01/01/2021. Daughter upset but understanding of situation. Patient updated on plan of care. Pt and daughter verbalized understanding and had no further questions.

## 2020-12-26 NOTE — Progress Notes (Signed)
Hematology and Oncology Follow Up Visit  [name] [name] [id number] Jul 02, 1945 75 y.o. 12/26/2020   Principle Diagnosis:  Hepatocellular carcinoma-multifocal Hepatitis C   Current Therapy:        Status post cycle 1 of nivolumab/ipilimumab -- d/c on 10/13/2020 due to hepatic toxicity Single agent Nivolumab 240 mg IV every 2 weeks - started 11/21/2020   Interim History:  [name] is here today with her daughter for follow-up and treatment.  She did have problems with her LFTs last week.  They are little bit too high for [place] to treat her with nivolumab.  Her LFTs and come down a little bit.  I just do not want to "give up" on the nivolumab.  Her last alpha-fetoprotein was stable at 14.7.  She has had no abdominal pain.  There is no nausea or vomiting.   Her blood sugars have been on the higher side.  She has had no bleeding.  She has seen surgery.  I think they are pretty much done with her.  They said that she is to have an upper endoscopy to see how the ulcer looks.  There is no cough or shortness of breath.  She has had no leg swelling.  There is been no diarrhea.  She has had no urinary difficulties.  Overall, I would say her performance status is ECOG 1-2.  Medications:  Allergies as of 12/26/2020       Reactions   Amoxicillin Swelling, Rash   Daughter is unsure if patient is allergic to Amoxicillin or Clarithromycin.   Bactrim [sulfamethoxazole-trimethoprim] Swelling   Clarithromycin Swelling, Rash   Daughter is unsure if Clarithromycin or Amoxicillin caused rash and swelling.        Medication List        Accurate as of December 26, 2020  1:20 PM. If you have any questions, ask your nurse or doctor.          acetaminophen 325 MG tablet Commonly known as: TYLENOL Take 2 tablets (650 mg total) by mouth every 6 (six) hours as needed for mild pain, moderate pain or fever.   brimonidine 0.1 % Soln Commonly known as: ALPHAGAN P Place  1 drop into both eyes in the morning and at bedtime.   brimonidine 0.2 % ophthalmic solution Commonly known as: ALPHAGAN 1 drop 2 (two) times daily.   diphenhydramine-acetaminophen 25-500 MG Tabs tablet Commonly known as: TYLENOL PM Take 1 tablet by mouth at bedtime as needed (sleep).   gabapentin 100 MG capsule Commonly known as: NEURONTIN Take 1 capsule (100 mg total) by mouth 3 (three) times daily.   influenza vaccine adjuvanted 0.5 ML injection Commonly known as: FLUAD Inject into the muscle.   losartan 50 MG tablet Commonly known as: COZAAR Take 1 tablet (50 mg total) by mouth at bedtime.   NIFEdipine 30 MG 24 hr tablet Commonly known as: PROCARDIA-XL/NIFEDICAL-XL Take 1 tablet (30 mg total) by mouth in the morning and at bedtime.   NovoLIN 70/30 (70-30) 100 UNIT/ML injection Generic drug: insulin NPH-regular Human Inject 20 units into the skin every morning and 5 units every afternoon with meals   ondansetron 4 MG disintegrating tablet Commonly known as: Zofran ODT Take 1 tablet (4 mg total) by mouth every 8 (eight) hours as needed for nausea or vomiting.   oxyCODONE 5 MG immediate release tablet Commonly known as: Oxy IR/ROXICODONE Take 1-2 tablets (5-10 mg total) by mouth every 6 (six) hours as needed for moderate pain or severe  pain.   pantoprazole 40 MG tablet Commonly known as: Protonix Take 1 tablet (40 mg total) by mouth 2 (two) times daily.   polyethylene glycol 17 g packet Commonly known as: MIRALAX / GLYCOLAX Take 17 g by mouth daily as needed for moderate constipation.   PRESCRIPTION MEDICATION Inject 20 Units as directed in the morning and at bedtime. Rx from [place]   senna 8.6 MG tablet Commonly known as: SENOKOT Take 2 tablets by mouth daily.        Allergies:  Allergies  Allergen Reactions   Amoxicillin Swelling and Rash    Daughter is unsure if patient is allergic to Amoxicillin or Clarithromycin.   Bactrim  [Sulfamethoxazole-Trimethoprim] Swelling   Clarithromycin Swelling and Rash    Daughter is unsure if Clarithromycin or Amoxicillin caused rash and swelling.    Past Medical History, Surgical history, Social history, and Family History were reviewed and updated.  Review of Systems: Review of Systems  Constitutional: Negative.   HENT: Negative.    Eyes: Negative.   Respiratory: Negative.    Cardiovascular: Negative.   Gastrointestinal: Negative.   Genitourinary: Negative.   Musculoskeletal: Negative.   Skin: Negative.   Neurological: Negative.   Endo/Heme/Allergies: Negative.   Psychiatric/Behavioral: Negative.      Physical Exam:  weight is 137 lb (62.1 kg). Her oral temperature is 98.1 F (36.7 C). Her blood pressure is 109/64 and her pulse is 80. Her respiration is 16 and oxygen saturation is 100%.   Wt Readings from Last 3 Encounters:  12/26/20 137 lb (62.1 kg)  12/25/20 139 lb (63 kg)  12/18/20 139 lb (63 kg)    Physical Exam Vitals reviewed.  HENT:     Head: Normocephalic and atraumatic.  Eyes:     Pupils: Pupils are equal, round, and reactive to light.  Cardiovascular:     Rate and Rhythm: Normal rate and regular rhythm.     Heart sounds: Normal heart sounds.  Pulmonary:     Effort: Pulmonary effort is normal.     Breath sounds: Normal breath sounds.  Abdominal:     General: Bowel sounds are normal.     Palpations: Abdomen is soft.  Musculoskeletal:        General: No tenderness or deformity. Normal range of motion.     Cervical back: Normal range of motion.  Lymphadenopathy:     Cervical: No cervical adenopathy.  Skin:    General: Skin is warm and dry.     Findings: No erythema or rash.  Neurological:     Mental Status: She is alert and oriented to person, place, and time.  Psychiatric:        Behavior: Behavior normal.        Thought Content: Thought content normal.        Judgment: Judgment normal.    Lab Results  Component Value Date   WBC  3.9 (L) 12/26/2020   HGB 12.0 12/26/2020   HCT 35.3 (L) 12/26/2020   MCV 88.0 12/26/2020   PLT 142 (L) 12/26/2020   Lab Results  Component Value Date   FERRITIN 603 (H) 10/02/2020   IRON 31 (L) 10/02/2020   TIBC 241 10/02/2020   UIBC 211 10/02/2020   IRONPCTSAT 13 (L) 10/02/2020   Lab Results  Component Value Date   RBC 4.01 12/26/2020   No results found for: KPAFRELGTCHN, LAMBDASER, KAPLAMBRATIO No results found for: IGGSERUM, IGA, IGMSERUM No results found for: TOTALPROTELP, ALBUMINELP, A1GS, A2GS, BETS, BETA2SER, [place], MSPIKE, SPEI  Chemistry      Component Value Date/Time   NA 136 12/26/2020 0835   K 4.0 12/26/2020 0835   CL 104 12/26/2020 0835   CO2 25 12/26/2020 0835   BUN 16 12/26/2020 0835   CREATININE 0.75 12/26/2020 0835      Component Value Date/Time   CALCIUM 10.3 12/26/2020 0835   ALKPHOS 194 (H) 12/26/2020 0835   AST 95 (H) 12/26/2020 0835   ALT 87 (H) 12/26/2020 0835   BILITOT 1.5 (H) 12/26/2020 0835       Impression and Plan: [name] is a very pleasant 75 yo female from [place] with hepatocellular carcinoma and Hepatitis C.   The LFTs have come down a little bit.  We will try another round of nivolumab.  If we find that her liver function studies are not tolerating the nivolumab, I think we are going to have to switch her over to [facility].  I think this would be a reasonable approach.  This is oral therapy.  I know this is a little bit complicated.  We will have her come back in a week when the nivolumab comes in.  She is on a assistance program.  I will plan to see her back in another 3 or 4 weeks.  We will get her through Thanksgiving.  Volanda Napoleon, MD 11/11/20221:20 PM

## 2020-12-26 NOTE — Addendum Note (Signed)
Addended by: Shelda Altes on: 12/26/2020 10:21 AM   Modules accepted: Orders

## 2020-12-26 NOTE — Patient Instructions (Signed)

## 2020-12-30 ENCOUNTER — Encounter: Payer: Self-pay | Admitting: *Deleted

## 2020-12-30 NOTE — Progress Notes (Signed)
Oncology Nurse Navigator Documentation  Oncology Nurse Navigator Flowsheets 12/30/2020  Abnormal Finding Date -  Confirmed Diagnosis Date -  Diagnosis Status -  Planned Course of Treatment -  Phase of Treatment -  Chemotherapy Actual Start Date: -  Navigator Follow Up Date: 01/21/2021  Navigator Follow Up Reason: Follow-up Appointment;Chemotherapy  Navigator Location [facility]  Referral Date to RadOnc/MedOnc -  Navigator Encounter Type Appt/Treatment Plan Review  Telephone -  Multidisiplinary Clinic Date -  Multidisiplinary Clinic Type -  Treatment Initiated Date -  Patient Visit Type MedOnc  Treatment Phase Active Tx  Barriers/Navigation Needs Coordination of Care;Education;Language/Communication  Education -  Interventions None Required  Acuity Level 2-Minimal Needs (1-2 Barriers Identified)  Coordination of Care -  Education Method -  Support Groups/Services Friends and Family  Time Spent with Patient 15

## 2021-01-01 ENCOUNTER — Inpatient Hospital Stay: Payer: Self-pay

## 2021-01-01 ENCOUNTER — Other Ambulatory Visit: Payer: Self-pay

## 2021-01-01 ENCOUNTER — Encounter: Payer: Self-pay | Admitting: Hematology & Oncology

## 2021-01-01 VITALS — BP 132/65 | HR 92 | Temp 98.5°F | Resp 17

## 2021-01-01 DIAGNOSIS — Z23 Encounter for immunization: Secondary | ICD-10-CM

## 2021-01-01 DIAGNOSIS — C220 Liver cell carcinoma: Secondary | ICD-10-CM

## 2021-01-01 MED ORDER — SODIUM CHLORIDE 0.9 % IV SOLN: Freq: Once | INTRAVENOUS | Status: AC

## 2021-01-01 MED ORDER — SODIUM CHLORIDE 0.9% FLUSH
10.0000 mL | INTRAVENOUS | Status: DC | PRN
  Administered 2021-01-01: 15:00:00 10 mL

## 2021-01-01 MED ORDER — SODIUM CHLORIDE 0.9 % IV SOLN
240.0000 mg | Freq: Once | INTRAVENOUS | Status: AC
  Administered 2021-01-01: 15:00:00 240 mg via INTRAVENOUS
  Filled 2021-01-01: qty 24

## 2021-01-01 MED ORDER — INFLUENZA VAC SPLIT QUAD 0.5 ML IM SUSY
0.5000 mL | PREFILLED_SYRINGE | Freq: Once | INTRAMUSCULAR | Status: AC
  Administered 2021-01-01: 15:00:00 0.5 mL via INTRAMUSCULAR
  Filled 2021-01-01: qty 0.5

## 2021-01-01 MED ORDER — HEPARIN SOD (PORK) LOCK FLUSH 100 UNIT/ML IV SOLN
500.0000 [IU] | Freq: Once | INTRAVENOUS | Status: AC | PRN
  Administered 2021-01-01: 15:00:00 500 [IU]

## 2021-01-01 NOTE — Patient Instructions (Signed)
[facility]  Discharge Instructions: Thank you for choosing [facility] to provide your oncology and hematology care.   If you have a lab appointment with the Cancer Center, please go directly to the Cancer Center and check in at the registration area.  Wear comfortable clothing and clothing appropriate for easy access to any Portacath or PICC line.   We strive to give you quality time with your provider. You may need to reschedule your appointment if you arrive late (15 or more minutes).  Arriving late affects you and other patients whose appointments are after yours.  Also, if you miss three or more appointments without notifying the office, you may be dismissed from the clinic at the provider's discretion.      For prescription refill requests, have your pharmacy contact our office and allow 72 hours for refills to be completed.    Today you received the following chemotherapy and/or immunotherapy agents:  Opdivo      To help prevent nausea and vomiting after your treatment, we encourage you to take your nausea medication as directed.  BELOW ARE SYMPTOMS THAT SHOULD BE REPORTED IMMEDIATELY: *FEVER GREATER THAN 100.4 F (38 C) OR HIGHER *CHILLS OR SWEATING *NAUSEA AND VOMITING THAT IS NOT CONTROLLED WITH YOUR NAUSEA MEDICATION *UNUSUAL SHORTNESS OF BREATH *UNUSUAL BRUISING OR BLEEDING *URINARY PROBLEMS (pain or burning when urinating, or frequent urination) *BOWEL PROBLEMS (unusual diarrhea, constipation, pain near the anus) TENDERNESS IN MOUTH AND THROAT WITH OR WITHOUT PRESENCE OF ULCERS (sore throat, sores in mouth, or a toothache) UNUSUAL RASH, SWELLING OR PAIN  UNUSUAL VAGINAL DISCHARGE OR ITCHING   Items with * indicate a potential emergency and should be followed up as soon as possible or go to the Emergency Department if any problems should occur.  Please show the CHEMOTHERAPY ALERT CARD or IMMUNOTHERAPY ALERT CARD at check-in to the  Emergency Department and triage nurse. Should you have questions after your visit or need to cancel or reschedule your appointment, please contact [facility]  [phone] and follow the prompts.  Office hours are 8:00 a.m. to 4:30 p.m. Monday - Friday. Please note that voicemails left after 4:00 p.m. may not be returned until the following business day.  We are closed weekends and major holidays. You have access to a nurse at all times for urgent questions. Please call the main number to the clinic [phone] and follow the prompts.  For any non-urgent questions, you may also contact your provider using MyChart. We now offer e-Visits for anyone 18 and older to request care online for non-urgent symptoms. For details visit [email or web address].   Also download the MyChart app! Go to the app store, search "MyChart", open the app, select , and log in with your MyChart username and password.  Due to Covid, a mask is required upon entering the hospital/clinic. If you do not have a mask, one will be given to you upon arrival. For doctor visits, patients may have 1 support person aged 18 or older with them. For treatment visits, patients cannot have anyone with them due to current Covid guidelines and our immunocompromised population.  

## 2021-01-01 NOTE — Progress Notes (Signed)
Per Dr. Marin Olp ok to treat with lab values from 12/26/2020

## 2021-01-02 ENCOUNTER — Other Ambulatory Visit: Payer: Self-pay

## 2021-01-07 ENCOUNTER — Telehealth: Payer: Self-pay | Admitting: Gastroenterology

## 2021-01-07 NOTE — Telephone Encounter (Signed)
Dr. Tarri Glenn,  The referral was initially put in the system 10/24 and patient requested female provider. Sorry, I left that information out in the first message.

## 2021-01-07 NOTE — Telephone Encounter (Signed)
Hey Dr. Tarri Glenn,   We received a referral and transfer of care for Gastric ulcer with perforation, unspecified chronicity and Hepatocellular carcinoma. Patient recently had omental Graham patch repair 07/2020 at [facility]. She was seen at [facility] 09/2020 but due to having [place] financial assistance she will need to transfer care. I have records from [place]. Could you please review and advise on scheduling?  Thank you

## 2021-01-09 NOTE — Telephone Encounter (Signed)
I will look for those records. I reviewed Epic and she is receiving care for [facility] through oncology. She would not need GI follow-up for that without their request/referral.

## 2021-01-13 MED ORDER — TRUEPLUS LANCETS 28G MISC
4 refills | Status: DC
  Filled 2021-01-13: qty 100, 25d supply, fill #0

## 2021-01-13 MED ORDER — TRUE METRIX BLOOD GLUCOSE TEST VI STRP
ORAL_STRIP | 12 refills | Status: AC
  Filled 2021-01-13: qty 100, 25d supply, fill #0

## 2021-01-13 NOTE — Addendum Note (Signed)
Addended by: Karle Plumber B on: 01/13/2021 05:57 PM   Modules accepted: Orders

## 2021-01-14 ENCOUNTER — Other Ambulatory Visit: Payer: Self-pay

## 2021-01-14 ENCOUNTER — Encounter: Payer: Self-pay | Admitting: Hematology & Oncology

## 2021-01-16 ENCOUNTER — Other Ambulatory Visit: Payer: Self-pay

## 2021-01-16 ENCOUNTER — Encounter: Payer: Self-pay | Admitting: Hematology & Oncology

## 2021-01-19 NOTE — Telephone Encounter (Signed)
I reviewed Epic and she is receiving care for [facility] through oncology. She would not need GI follow-up for that without their request/referral.

## 2021-01-20 NOTE — Telephone Encounter (Signed)
Spoke with patient daughter and explained patient is receiving care through oncology and GI followup is not needed without their referral

## 2021-01-21 ENCOUNTER — Encounter: Payer: Self-pay | Admitting: Hematology & Oncology

## 2021-01-21 ENCOUNTER — Inpatient Hospital Stay: Payer: Self-pay

## 2021-01-21 ENCOUNTER — Other Ambulatory Visit: Payer: Self-pay

## 2021-01-21 ENCOUNTER — Inpatient Hospital Stay: Payer: Self-pay | Attending: Hematology & Oncology

## 2021-01-21 ENCOUNTER — Encounter: Payer: Self-pay | Admitting: Family

## 2021-01-21 ENCOUNTER — Inpatient Hospital Stay (HOSPITAL_BASED_OUTPATIENT_CLINIC_OR_DEPARTMENT_OTHER): Payer: Self-pay | Admitting: Family

## 2021-01-21 VITALS — BP 131/61 | HR 87 | Temp 98.3°F | Resp 17

## 2021-01-21 DIAGNOSIS — B1920 Unspecified viral hepatitis C without hepatic coma: Secondary | ICD-10-CM | POA: Insufficient documentation

## 2021-01-21 DIAGNOSIS — G629 Polyneuropathy, unspecified: Secondary | ICD-10-CM | POA: Insufficient documentation

## 2021-01-21 DIAGNOSIS — K255 Chronic or unspecified gastric ulcer with perforation: Secondary | ICD-10-CM

## 2021-01-21 DIAGNOSIS — Z5112 Encounter for antineoplastic immunotherapy: Secondary | ICD-10-CM | POA: Insufficient documentation

## 2021-01-21 DIAGNOSIS — R002 Palpitations: Secondary | ICD-10-CM | POA: Insufficient documentation

## 2021-01-21 DIAGNOSIS — C220 Liver cell carcinoma: Secondary | ICD-10-CM

## 2021-01-21 LAB — CBC WITH DIFFERENTIAL (CANCER CENTER ONLY)
Abs Immature Granulocytes: 0.01 10*3/uL (ref 0.00–0.07)
Basophils Absolute: 0 10*3/uL (ref 0.0–0.1)
Basophils Relative: 1 %
Eosinophils Absolute: 0.1 10*3/uL (ref 0.0–0.5)
Eosinophils Relative: 2 %
HCT: 35.3 % — ABNORMAL LOW (ref 36.0–46.0)
Hemoglobin: 11.8 g/dL — ABNORMAL LOW (ref 12.0–15.0)
Immature Granulocytes: 0 %
Lymphocytes Relative: 37 %
Lymphs Abs: 1.8 10*3/uL (ref 0.7–4.0)
MCH: 30.2 pg (ref 26.0–34.0)
MCHC: 33.4 g/dL (ref 30.0–36.0)
MCV: 90.3 fL (ref 80.0–100.0)
Monocytes Absolute: 0.4 10*3/uL (ref 0.1–1.0)
Monocytes Relative: 8 %
Neutro Abs: 2.5 10*3/uL (ref 1.7–7.7)
Neutrophils Relative %: 52 %
Platelet Count: 156 10*3/uL (ref 150–400)
RBC: 3.91 MIL/uL (ref 3.87–5.11)
RDW: 16.1 % — ABNORMAL HIGH (ref 11.5–15.5)
WBC Count: 4.8 10*3/uL (ref 4.0–10.5)
nRBC: 0 % (ref 0.0–0.2)

## 2021-01-21 LAB — CMP (CANCER CENTER ONLY)
ALT: 72 U/L — ABNORMAL HIGH (ref 0–44)
AST: 66 U/L — ABNORMAL HIGH (ref 15–41)
Albumin: 3.7 g/dL (ref 3.5–5.0)
Alkaline Phosphatase: 182 U/L — ABNORMAL HIGH (ref 38–126)
Anion gap: 5 (ref 5–15)
BUN: 17 mg/dL (ref 8–23)
CO2: 25 mmol/L (ref 22–32)
Calcium: 10.4 mg/dL — ABNORMAL HIGH (ref 8.9–10.3)
Chloride: 103 mmol/L (ref 98–111)
Creatinine: 0.69 mg/dL (ref 0.44–1.00)
GFR, Estimated: >60 mL/min (ref >60)
Glucose, Bld: 208 mg/dL — ABNORMAL HIGH (ref 70–99)
Potassium: 3.9 mmol/L (ref 3.5–5.1)
Sodium: 133 mmol/L — ABNORMAL LOW (ref 135–145)
Total Bilirubin: 1 mg/dL (ref 0.3–1.2)
Total Protein: 7.5 g/dL (ref 6.5–8.1)

## 2021-01-21 MED ORDER — SODIUM CHLORIDE 0.9 % IV SOLN
240.0000 mg | Freq: Once | INTRAVENOUS | Status: AC
  Administered 2021-01-21: 240 mg via INTRAVENOUS
  Filled 2021-01-21: qty 24

## 2021-01-21 MED ORDER — SODIUM CHLORIDE 0.9 % IV SOLN: Freq: Once | INTRAVENOUS | Status: AC

## 2021-01-21 MED ORDER — SODIUM CHLORIDE 0.9% FLUSH
10.0000 mL | INTRAVENOUS | Status: DC | PRN
  Administered 2021-01-21: 10 mL

## 2021-01-21 MED ORDER — HEPARIN SOD (PORK) LOCK FLUSH 100 UNIT/ML IV SOLN
500.0000 [IU] | Freq: Once | INTRAVENOUS | Status: AC | PRN
  Administered 2021-01-21: 500 [IU]

## 2021-01-21 NOTE — Progress Notes (Signed)
Hematology and Oncology Follow Up Visit  [name] [name] [id number] [birth year] 75 y.o. 01/21/2021   Principle Diagnosis:  Hepatocellular carcinoma-multifocal Hepatitis C   Current Therapy:        Status post cycle 1 of nivolumab/ipilimumab -- d/c on 10/13/2020 due to hepatic toxicity Single agent Nivolumab 240 mg IV every 2 weeks - started 11/21/2020   Interim History:  [name] is here today with her daughter for follow-up and treatment.  Her LFT's appear to be stable at this time.  AFP last visit was 14.7.  She admits that she needs to exercise a bit more throughout the day.  She has occasional palpitations with over exertion (stairs). This resolves with taking a break to rest.  No fever, chills, n/v, cough, rash, dizziness, SOB, chest pain, abdominal pain or changes in bowel or bladder habits.  No blood loss noted. No bruising or petechiae.  No swelling or tenderness in her extremities.  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  No falls or syncope to report.  She has a good appetite and is staying well hydrated.   ECOG Performance Status: 1 - Symptomatic but completely ambulatory  Medications:  Allergies as of 01/21/2021       Reactions   Amoxicillin Swelling, Rash   Daughter is unsure if patient is allergic to Amoxicillin or Clarithromycin.   Bactrim [sulfamethoxazole-trimethoprim] Swelling   Clarithromycin Swelling, Rash   Daughter is unsure if Clarithromycin or Amoxicillin caused rash and swelling.        Medication List        Accurate as of January 21, 2021  2:50 PM. If you have any questions, ask your nurse or doctor.          acetaminophen 325 MG tablet Commonly known as: TYLENOL Take 2 tablets (650 mg total) by mouth every 6 (six) hours as needed for mild pain, moderate pain or fever.   brimonidine 0.1 % Soln Commonly known as: ALPHAGAN P Place 1 drop into both eyes in the morning and at bedtime.    brimonidine 0.2 % ophthalmic solution Commonly known as: ALPHAGAN 1 drop 2 (two) times daily.   diphenhydramine-acetaminophen 25-500 MG Tabs tablet Commonly known as: TYLENOL PM Take 1 tablet by mouth at bedtime as needed (sleep).   gabapentin 100 MG capsule Commonly known as: NEURONTIN Take 1 capsule (100 mg total) by mouth 3 (three) times daily.   influenza vaccine adjuvanted 0.5 ML injection Commonly known as: FLUAD Inject into the muscle.   losartan 50 MG tablet Commonly known as: COZAAR Take 1 tablet (50 mg total) by mouth at bedtime.   NIFEdipine 30 MG 24 hr tablet Commonly known as: PROCARDIA-XL/NIFEDICAL-XL Take 1 tablet (30 mg total) by mouth in the morning and at bedtime.   NovoLIN 70/30 (70-30) 100 UNIT/ML injection Generic drug: insulin NPH-regular Human Inject 20 units into the skin every morning and 5 units every afternoon with meals   ondansetron 4 MG disintegrating tablet Commonly known as: Zofran ODT Take 1 tablet (4 mg total) by mouth every 8 (eight) hours as needed for nausea or vomiting.   oxyCODONE 5 MG immediate release tablet Commonly known as: Oxy IR/ROXICODONE Take 1-2 tablets (5-10 mg total) by mouth every 6 (six) hours as needed for moderate pain or severe pain.   pantoprazole 40 MG tablet Commonly known as: Protonix Take 1 tablet (40 mg total) by mouth 2 (two) times daily.   polyethylene glycol 17 g packet Commonly known as:  MIRALAX / GLYCOLAX Take 17 g by mouth daily as needed for moderate constipation.   PRESCRIPTION MEDICATION Inject 20 Units as directed in the morning and at bedtime. Rx from [place]   senna 8.6 MG tablet Commonly known as: SENOKOT Take 2 tablets by mouth daily.   True Metrix Blood Glucose Test test strip Generic drug: glucose blood Use as instructed   TRUEplus Lancets 28G Misc Use as directed        Allergies:  Allergies  Allergen Reactions   Amoxicillin Swelling and Rash    Daughter is unsure if  patient is allergic to Amoxicillin or Clarithromycin.   Bactrim [Sulfamethoxazole-Trimethoprim] Swelling   Clarithromycin Swelling and Rash    Daughter is unsure if Clarithromycin or Amoxicillin caused rash and swelling.    Past Medical History, Surgical history, Social history, and Family History were reviewed and updated.  Review of Systems: All other 10 point review of systems is negative.   Physical Exam:  oral temperature is 98.3 F (36.8 C). Her blood pressure is 131/61 and her pulse is 87. Her respiration is 17 and oxygen saturation is 100%.   Wt Readings from Last 3 Encounters:  12/26/20 137 lb (62.1 kg)  12/25/20 139 lb (63 kg)  12/18/20 139 lb (63 kg)    Ocular: Sclerae unicteric, pupils equal, round and reactive to light Ear-nose-throat: Oropharynx clear, dentition fair Lymphatic: No cervical or supraclavicular adenopathy Lungs no rales or rhonchi, good excursion bilaterally Heart regular rate and rhythm, no murmur appreciated Abd soft, nontender, positive bowel sounds MSK no focal spinal tenderness, no joint edema Neuro: non-focal, well-oriented, appropriate affect Breasts: Deferred   Lab Results  Component Value Date   WBC 4.8 01/21/2021   HGB 11.8 (L) 01/21/2021   HCT 35.3 (L) 01/21/2021   MCV 90.3 01/21/2021   PLT 156 01/21/2021   Lab Results  Component Value Date   FERRITIN 603 (H) 10/02/2020   IRON 31 (L) 10/02/2020   TIBC 241 10/02/2020   UIBC 211 10/02/2020   IRONPCTSAT 13 (L) 10/02/2020   Lab Results  Component Value Date   RBC 3.91 01/21/2021   No results found for: KPAFRELGTCHN, LAMBDASER, KAPLAMBRATIO No results found for: IGGSERUM, IGA, IGMSERUM No results found for: TOTALPROTELP, ALBUMINELP, A1GS, A2GS, BETS, BETA2SER, GAMS, MSPIKE, SPEI   Chemistry      Component Value Date/Time   NA 133 (L) 01/21/2021 1343   K 3.9 01/21/2021 1343   CL 103 01/21/2021 1343   CO2 25 01/21/2021 1343   BUN 17 01/21/2021 1343   CREATININE 0.69  01/21/2021 1343      Component Value Date/Time   CALCIUM 10.4 (H) 01/21/2021 1343   ALKPHOS 182 (H) 01/21/2021 1343   AST 66 (H) 01/21/2021 1343   ALT 72 (H) 01/21/2021 1343   BILITOT 1.0 01/21/2021 1343       Impression and Plan: [name] is a very pleasant 75 yo female from [place] with hepatocellular carcinoma and Hepatitis C.  LFT's reviewed with MD and we will proceed with treatment today as planned.  Follow-up in 4 weeks.   Lottie Dawson, NP 12/7/20222:50 PM

## 2021-01-21 NOTE — Patient Instructions (Signed)

## 2021-01-21 NOTE — Patient Instructions (Signed)
[facility]  Discharge Instructions: Thank you for choosing [facility] to provide your oncology and hematology care.   If you have a lab appointment with the Cancer Center, please go directly to the Cancer Center and check in at the registration area.  Wear comfortable clothing and clothing appropriate for easy access to any Portacath or PICC line.   We strive to give you quality time with your provider. You may need to reschedule your appointment if you arrive late (15 or more minutes).  Arriving late affects you and other patients whose appointments are after yours.  Also, if you miss three or more appointments without notifying the office, you may be dismissed from the clinic at the provider's discretion.      For prescription refill requests, have your pharmacy contact our office and allow 72 hours for refills to be completed.    Today you received the following chemotherapy and/or immunotherapy agents:  Opdivo      To help prevent nausea and vomiting after your treatment, we encourage you to take your nausea medication as directed.  BELOW ARE SYMPTOMS THAT SHOULD BE REPORTED IMMEDIATELY: *FEVER GREATER THAN 100.4 F (38 C) OR HIGHER *CHILLS OR SWEATING *NAUSEA AND VOMITING THAT IS NOT CONTROLLED WITH YOUR NAUSEA MEDICATION *UNUSUAL SHORTNESS OF BREATH *UNUSUAL BRUISING OR BLEEDING *URINARY PROBLEMS (pain or burning when urinating, or frequent urination) *BOWEL PROBLEMS (unusual diarrhea, constipation, pain near the anus) TENDERNESS IN MOUTH AND THROAT WITH OR WITHOUT PRESENCE OF ULCERS (sore throat, sores in mouth, or a toothache) UNUSUAL RASH, SWELLING OR PAIN  UNUSUAL VAGINAL DISCHARGE OR ITCHING   Items with * indicate a potential emergency and should be followed up as soon as possible or go to the Emergency Department if any problems should occur.  Please show the CHEMOTHERAPY ALERT CARD or IMMUNOTHERAPY ALERT CARD at check-in to the  Emergency Department and triage nurse. Should you have questions after your visit or need to cancel or reschedule your appointment, please contact [facility]  [phone] and follow the prompts.  Office hours are 8:00 a.m. to 4:30 p.m. Monday - Friday. Please note that voicemails left after 4:00 p.m. may not be returned until the following business day.  We are closed weekends and major holidays. You have access to a nurse at all times for urgent questions. Please call the main number to the clinic [phone] and follow the prompts.  For any non-urgent questions, you may also contact your provider using MyChart. We now offer e-Visits for anyone 18 and older to request care online for non-urgent symptoms. For details visit [email or web address].   Also download the MyChart app! Go to the app store, search "MyChart", open the app, select [place], and log in with your MyChart username and password.  Due to Covid, a mask is required upon entering the hospital/clinic. If you do not have a mask, one will be given to you upon arrival. For doctor visits, patients may have 1 support person aged 18 or older with them. For treatment visits, patients cannot have anyone with them due to current Covid guidelines and our immunocompromised population.  

## 2021-01-21 NOTE — Telephone Encounter (Signed)
Spoke with pt at in office apt today. Daughter states [facility] said referral had to come from our office, not PCP. Per Judson Roch ok to send referral- done. Daughter and pt aware.

## 2021-01-22 ENCOUNTER — Encounter: Payer: Self-pay | Admitting: *Deleted

## 2021-01-22 NOTE — Progress Notes (Signed)
Patient on maintenance chemotherapy and no navigation needs have been identified the last several visits. Will discontinue active navigation.   Oncology Nurse Navigator Documentation  Oncology Nurse Navigator Flowsheets 01/22/2021  Abnormal Finding Date -  Confirmed Diagnosis Date -  Diagnosis Status -  Planned Course of Treatment -  Phase of Treatment -  Chemotherapy Actual Start Date: -  Navigator Follow Up Date: -  Navigator Follow Up Reason: -  Navigation Complete Date: 01/22/2021  Post Navigation: Continue to Follow Patient? No  Reason Not Navigating Patient: Patient On Maintenance Chemotherapy  Navigator Location [facility]  Referral Date to RadOnc/MedOnc -  Navigator Encounter Type Appt/Treatment Plan Review  Telephone -  Multidisiplinary Clinic Date -  Multidisiplinary Clinic Type -  Treatment Initiated Date -  Patient Visit Type MedOnc  Treatment Phase Active Tx  Barriers/Navigation Needs Coordination of Care;Education;Language/Communication  Education -  Interventions None Required  Acuity Level 2-Minimal Needs (1-2 Barriers Identified)  Coordination of Care -  Education Method -  Support Groups/Services Friends and Family  Time Spent with Patient 15

## 2021-01-23 LAB — AFP TUMOR MARKER: AFP, Serum, Tumor Marker: 16 ng/mL — ABNORMAL HIGH (ref 0.0–9.2)

## 2021-01-27 ENCOUNTER — Other Ambulatory Visit: Payer: Self-pay

## 2021-01-27 ENCOUNTER — Ambulatory Visit: Payer: Self-pay | Attending: Internal Medicine | Admitting: Pharmacist

## 2021-01-27 ENCOUNTER — Encounter: Payer: Self-pay | Admitting: Hematology & Oncology

## 2021-01-27 DIAGNOSIS — Z23 Encounter for immunization: Secondary | ICD-10-CM

## 2021-01-27 NOTE — Telephone Encounter (Signed)
Patients daughter called back today to let us know the Oncology office has sent a referral for GI and she is wanting to proceed if possible.

## 2021-01-27 NOTE — Progress Notes (Signed)
Patient presents for vaccination against zoster per orders of Dr. Wynetta Emery. Consent given. Counseling provided. No contraindications exists. Vaccine administered without incident.   Not entering as an order. Will coordinate with pharmacy to administer via patient assistance.   Pt to return 2-6 months after initial injection to have second injection.   Benard Halsted, PharmD, [place], [place] [phone]

## 2021-01-29 ENCOUNTER — Other Ambulatory Visit: Payer: Self-pay

## 2021-01-29 DIAGNOSIS — Z1231 Encounter for screening mammogram for malignant neoplasm of breast: Secondary | ICD-10-CM

## 2021-02-02 ENCOUNTER — Other Ambulatory Visit: Payer: Self-pay

## 2021-02-02 ENCOUNTER — Encounter: Payer: Self-pay | Admitting: Gastroenterology

## 2021-02-02 ENCOUNTER — Other Ambulatory Visit (HOSPITAL_BASED_OUTPATIENT_CLINIC_OR_DEPARTMENT_OTHER): Payer: Self-pay

## 2021-02-02 ENCOUNTER — Ambulatory Visit: Payer: Self-pay | Attending: Internal Medicine

## 2021-02-02 ENCOUNTER — Encounter: Payer: Self-pay | Admitting: Hematology & Oncology

## 2021-02-02 DIAGNOSIS — Z23 Encounter for immunization: Secondary | ICD-10-CM

## 2021-02-02 MED ORDER — PFIZER COVID-19 VAC BIVALENT 30 MCG/0.3ML IM SUSP
INTRAMUSCULAR | 0 refills | Status: DC
  Filled 2021-02-02: qty 0.3, 1d supply, fill #0

## 2021-02-02 NOTE — Progress Notes (Signed)
° °  Covid-19 Vaccination Clinic  Name:  [name] [name]    MRN: [id number] DOB: [birth year]  02/02/2021  [name] was observed post Covid-19 immunization for 15 minutes without incident. She was provided with Vaccine Information Sheet and instruction to access the V-Safe system.   [name] was instructed to call 911 with any severe reactions post vaccine: Difficulty breathing  Swelling of face and throat  A fast heartbeat  A bad rash all over body  Dizziness and weakness   Immunizations Administered     Name Date Dose VIS Date Route   Pfizer Covid-19 Vaccine Bivalent Booster 02/02/2021  3:16 PM 0.3 mL 10/15/2020 Intramuscular   Manufacturer: [place]   Lot: [id number]   [place]: [phone]

## 2021-02-05 ENCOUNTER — Encounter: Payer: Self-pay | Admitting: Hematology & Oncology

## 2021-02-05 ENCOUNTER — Other Ambulatory Visit: Payer: Self-pay

## 2021-02-06 ENCOUNTER — Other Ambulatory Visit: Payer: Self-pay

## 2021-02-18 ENCOUNTER — Inpatient Hospital Stay: Payer: Self-pay | Attending: Hematology & Oncology

## 2021-02-18 ENCOUNTER — Inpatient Hospital Stay: Payer: Self-pay

## 2021-02-18 ENCOUNTER — Inpatient Hospital Stay (HOSPITAL_BASED_OUTPATIENT_CLINIC_OR_DEPARTMENT_OTHER): Payer: Self-pay | Admitting: Family

## 2021-02-18 ENCOUNTER — Encounter: Payer: Self-pay | Admitting: Family

## 2021-02-18 ENCOUNTER — Other Ambulatory Visit: Payer: Self-pay

## 2021-02-18 VITALS — BP 123/63 | HR 81 | Temp 98.3°F | Resp 17 | Wt 142.0 lb

## 2021-02-18 DIAGNOSIS — R748 Abnormal levels of other serum enzymes: Secondary | ICD-10-CM | POA: Insufficient documentation

## 2021-02-18 DIAGNOSIS — B1920 Unspecified viral hepatitis C without hepatic coma: Secondary | ICD-10-CM | POA: Insufficient documentation

## 2021-02-18 DIAGNOSIS — Z7984 Long term (current) use of oral hypoglycemic drugs: Secondary | ICD-10-CM | POA: Insufficient documentation

## 2021-02-18 DIAGNOSIS — G620 Drug-induced polyneuropathy: Secondary | ICD-10-CM | POA: Insufficient documentation

## 2021-02-18 DIAGNOSIS — C220 Liver cell carcinoma: Secondary | ICD-10-CM

## 2021-02-18 DIAGNOSIS — E119 Type 2 diabetes mellitus without complications: Secondary | ICD-10-CM | POA: Insufficient documentation

## 2021-02-18 DIAGNOSIS — Z79899 Other long term (current) drug therapy: Secondary | ICD-10-CM | POA: Insufficient documentation

## 2021-02-18 DIAGNOSIS — Z5112 Encounter for antineoplastic immunotherapy: Secondary | ICD-10-CM | POA: Insufficient documentation

## 2021-02-18 LAB — CBC WITH DIFFERENTIAL (CANCER CENTER ONLY)
Abs Immature Granulocytes: 0.01 10*3/uL (ref 0.00–0.07)
Basophils Absolute: 0 10*3/uL (ref 0.0–0.1)
Basophils Relative: 0 %
Eosinophils Absolute: 0.1 10*3/uL (ref 0.0–0.5)
Eosinophils Relative: 2 %
HCT: 37.7 % (ref 36.0–46.0)
Hemoglobin: 12.7 g/dL (ref 12.0–15.0)
Immature Granulocytes: 0 %
Lymphocytes Relative: 41 %
Lymphs Abs: 2 10*3/uL (ref 0.7–4.0)
MCH: 30.7 pg (ref 26.0–34.0)
MCHC: 33.7 g/dL (ref 30.0–36.0)
MCV: 91.1 fL (ref 80.0–100.0)
Monocytes Absolute: 0.4 10*3/uL (ref 0.1–1.0)
Monocytes Relative: 8 %
Neutro Abs: 2.4 10*3/uL (ref 1.7–7.7)
Neutrophils Relative %: 49 %
Platelet Count: 169 10*3/uL (ref 150–400)
RBC: 4.14 MIL/uL (ref 3.87–5.11)
RDW: 15.5 % (ref 11.5–15.5)
WBC Count: 4.9 10*3/uL (ref 4.0–10.5)
nRBC: 0 % (ref 0.0–0.2)

## 2021-02-18 LAB — CMP (CANCER CENTER ONLY)
ALT: 109 U/L — ABNORMAL HIGH (ref 0–44)
AST: 110 U/L — ABNORMAL HIGH (ref 15–41)
Albumin: 3.8 g/dL (ref 3.5–5.0)
Alkaline Phosphatase: 192 U/L — ABNORMAL HIGH (ref 38–126)
Anion gap: 5 (ref 5–15)
BUN: 19 mg/dL (ref 8–23)
CO2: 25 mmol/L (ref 22–32)
Calcium: 10.6 mg/dL — ABNORMAL HIGH (ref 8.9–10.3)
Chloride: 102 mmol/L (ref 98–111)
Creatinine: 0.62 mg/dL (ref 0.44–1.00)
GFR, Estimated: >60 mL/min (ref >60)
Glucose, Bld: 182 mg/dL — ABNORMAL HIGH (ref 70–99)
Potassium: 3.8 mmol/L (ref 3.5–5.1)
Sodium: 132 mmol/L — ABNORMAL LOW (ref 135–145)
Total Bilirubin: 1.3 mg/dL — ABNORMAL HIGH (ref 0.3–1.2)
Total Protein: 7.7 g/dL (ref 6.5–8.1)

## 2021-02-18 LAB — LACTATE DEHYDROGENASE: LDH: 180 U/L (ref 98–192)

## 2021-02-18 MED ORDER — SODIUM CHLORIDE 0.9 % IV SOLN
240.0000 mg | Freq: Once | INTRAVENOUS | Status: AC
  Administered 2021-02-18: 240 mg via INTRAVENOUS
  Filled 2021-02-18: qty 24

## 2021-02-18 MED ORDER — SODIUM CHLORIDE 0.9 % IV SOLN: Freq: Once | INTRAVENOUS | Status: AC

## 2021-02-18 MED ORDER — HEPARIN SOD (PORK) LOCK FLUSH 100 UNIT/ML IV SOLN
500.0000 [IU] | Freq: Once | INTRAVENOUS | Status: AC | PRN
  Administered 2021-02-18: 500 [IU]

## 2021-02-18 MED ORDER — SODIUM CHLORIDE 0.9% FLUSH
10.0000 mL | INTRAVENOUS | Status: DC | PRN
  Administered 2021-02-18: 10 mL

## 2021-02-18 NOTE — Patient Instructions (Signed)
[facility]  Discharge Instructions: Thank you for choosing [facility] to provide your oncology and hematology care.   If you have a lab appointment with the Cancer Center, please go directly to the Cancer Center and check in at the registration area.  Wear comfortable clothing and clothing appropriate for easy access to any Portacath or PICC line.   We strive to give you quality time with your provider. You may need to reschedule your appointment if you arrive late (15 or more minutes).  Arriving late affects you and other patients whose appointments are after yours.  Also, if you miss three or more appointments without notifying the office, you may be dismissed from the clinic at the provider's discretion.      For prescription refill requests, have your pharmacy contact our office and allow 72 hours for refills to be completed.    Today you received the following chemotherapy and/or immunotherapy agents:  Opdivo      To help prevent nausea and vomiting after your treatment, we encourage you to take your nausea medication as directed.  BELOW ARE SYMPTOMS THAT SHOULD BE REPORTED IMMEDIATELY: *FEVER GREATER THAN 100.4 F (38 C) OR HIGHER *CHILLS OR SWEATING *NAUSEA AND VOMITING THAT IS NOT CONTROLLED WITH YOUR NAUSEA MEDICATION *UNUSUAL SHORTNESS OF BREATH *UNUSUAL BRUISING OR BLEEDING *URINARY PROBLEMS (pain or burning when urinating, or frequent urination) *BOWEL PROBLEMS (unusual diarrhea, constipation, pain near the anus) TENDERNESS IN MOUTH AND THROAT WITH OR WITHOUT PRESENCE OF ULCERS (sore throat, sores in mouth, or a toothache) UNUSUAL RASH, SWELLING OR PAIN  UNUSUAL VAGINAL DISCHARGE OR ITCHING   Items with * indicate a potential emergency and should be followed up as soon as possible or go to the Emergency Department if any problems should occur.  Please show the CHEMOTHERAPY ALERT CARD or IMMUNOTHERAPY ALERT CARD at check-in to the  Emergency Department and triage nurse. Should you have questions after your visit or need to cancel or reschedule your appointment, please contact [facility]  [phone] and follow the prompts.  Office hours are 8:00 a.m. to 4:30 p.m. Monday - Friday. Please note that voicemails left after 4:00 p.m. may not be returned until the following business day.  We are closed weekends and major holidays. You have access to a nurse at all times for urgent questions. Please call the main number to the clinic [phone] and follow the prompts.  For any non-urgent questions, you may also contact your provider using MyChart. We now offer e-Visits for anyone 18 and older to request care online for non-urgent symptoms. For details visit [email or web address].   Also download the MyChart app! Go to the app store, search "MyChart", open the app, select [facility], and log in with your MyChart username and password.  Due to Covid, a mask is required upon entering the hospital/clinic. If you do not have a mask, one will be given to you upon arrival. For doctor visits, patients may have 1 support person aged 18 or older with them. For treatment visits, patients cannot have anyone with them due to current Covid guidelines and our immunocompromised population.  

## 2021-02-18 NOTE — Progress Notes (Signed)
Per Lottie Dawson, NP ok to treat with lab values from 02/18/21.

## 2021-02-18 NOTE — Progress Notes (Signed)
Hematology and Oncology Follow Up Visit  [name] [name] [id number] 07-09-1945 76 y.o. 02/18/2021   Principle Diagnosis:  Hepatocellular carcinoma-multifocal Hepatitis C   Current Therapy:        Status post cycle 1 of nivolumab/ipilimumab -- d/c on 10/13/2020 due to hepatic toxicity Single agent Nivolumab 240 mg IV every 2 weeks - started 11/21/2020   Interim History:  [name] [name] is here today for follow-up. She is doing well and her only complaint at this time is the neuropathy in her toes.  Pedal pulses are 2+.  No swelling or tenderness in her extremities.  No falls or syncope to report.  AST is 110, ALT 109 and alk phos 192. AFP at last visit was 68.  She denies fever, chills, n/v, cough, rash, dizziness, SOB, chest pain, palpitations, abdominal pain/bloating or changes in bowel or bladder habits.  No blood loss noted. No petechiae or bruising.  She has been eating well and is doing her best to stay well hydrated. Her weight is stable at 142 lbs.   ECOG Performance Status: 1 - Symptomatic but completely ambulatory  Medications:  Allergies as of 02/18/2021       Reactions   Amoxicillin Swelling, Rash   Daughter is unsure if patient is allergic to Amoxicillin or Clarithromycin.   Bactrim [sulfamethoxazole-trimethoprim] Swelling   Clarithromycin Swelling, Rash   Daughter is unsure if Clarithromycin or Amoxicillin caused rash and swelling.        Medication List        Accurate as of February 18, 2021  1:48 PM. If you have any questions, ask your nurse or doctor.          acetaminophen 325 MG tablet Commonly known as: TYLENOL Take 2 tablets (650 mg total) by mouth every 6 (six) hours as needed for mild pain, moderate pain or fever.   brimonidine 0.1 % Soln Commonly known as: ALPHAGAN P Place 1 drop into both eyes in the morning and at bedtime.   brimonidine 0.2 % ophthalmic solution Commonly known as: ALPHAGAN 1 drop 2 (two) times  daily.   diphenhydramine-acetaminophen 25-500 MG Tabs tablet Commonly known as: TYLENOL PM Take 1 tablet by mouth at bedtime as needed (sleep).   gabapentin 100 MG capsule Commonly known as: NEURONTIN Take 1 capsule (100 mg total) by mouth 3 (three) times daily.   influenza vaccine adjuvanted 0.5 ML injection Commonly known as: FLUAD Inject into the muscle.   losartan 50 MG tablet Commonly known as: COZAAR Take 1 tablet (50 mg total) by mouth at bedtime.   NIFEdipine 30 MG 24 hr tablet Commonly known as: PROCARDIA-XL/NIFEDICAL-XL Take 1 tablet (30 mg total) by mouth in the morning and at bedtime.   NovoLIN 70/30 (70-30) 100 UNIT/ML injection Generic drug: insulin NPH-regular Human Inject 20 units into the skin every morning and 5 units every afternoon with meals   ondansetron 4 MG disintegrating tablet Commonly known as: Zofran ODT Take 1 tablet (4 mg total) by mouth every 8 (eight) hours as needed for nausea or vomiting.   oxyCODONE 5 MG immediate release tablet Commonly known as: Oxy IR/ROXICODONE Take 1-2 tablets (5-10 mg total) by mouth every 6 (six) hours as needed for moderate pain or severe pain.   pantoprazole 40 MG tablet Commonly known as: Protonix Take 1 tablet (40 mg total) by mouth 2 (two) times daily.   Pfizer COVID-19 Vac Bivalent injection Generic drug: COVID-19 mRNA bivalent vaccine Therapist, music) Inject into the muscle.   polyethylene  glycol 17 g packet Commonly known as: MIRALAX / GLYCOLAX Take 17 g by mouth daily as needed for moderate constipation.   PRESCRIPTION MEDICATION Inject 20 Units as directed in the morning and at bedtime. Rx from [place]   senna 8.6 MG tablet Commonly known as: SENOKOT Take 2 tablets by mouth daily.   True Metrix Blood Glucose Test test strip Generic drug: glucose blood Use as instructed   TRUEplus Lancets 28G Misc Use as directed        Allergies:  Allergies  Allergen Reactions   Amoxicillin Swelling and  Rash    Daughter is unsure if patient is allergic to Amoxicillin or Clarithromycin.   Bactrim [Sulfamethoxazole-Trimethoprim] Swelling   Clarithromycin Swelling and Rash    Daughter is unsure if Clarithromycin or Amoxicillin caused rash and swelling.    Past Medical History, Surgical history, Social history, and Family History were reviewed and updated.  Review of Systems: All other 10 point review of systems is negative.   Physical Exam:  vitals were not taken for this visit.   Wt Readings from Last 3 Encounters:  12/26/20 137 lb (62.1 kg)  12/25/20 139 lb (63 kg)  12/18/20 139 lb (63 kg)    Ocular: Sclerae unicteric, pupils equal, round and reactive to light Ear-nose-throat: Oropharynx clear, dentition fair Lymphatic: No cervical or supraclavicular adenopathy Lungs no rales or rhonchi, good excursion bilaterally Heart regular rate and rhythm, no murmur appreciated Abd soft, nontender, positive bowel sounds MSK no focal spinal tenderness, no joint edema Neuro: non-focal, well-oriented, appropriate affect Breasts: Deferred   Lab Results  Component Value Date   WBC 4.8 01/21/2021   HGB 11.8 (L) 01/21/2021   HCT 35.3 (L) 01/21/2021   MCV 90.3 01/21/2021   PLT 156 01/21/2021   Lab Results  Component Value Date   FERRITIN 603 (H) 10/02/2020   IRON 31 (L) 10/02/2020   TIBC 241 10/02/2020   UIBC 211 10/02/2020   IRONPCTSAT 13 (L) 10/02/2020   Lab Results  Component Value Date   RBC 3.91 01/21/2021   No results found for: KPAFRELGTCHN, LAMBDASER, KAPLAMBRATIO No results found for: IGGSERUM, IGA, IGMSERUM No results found for: Ronnald Ramp, A1GS, A2GS, BETS, BETA2SER, GAMS, MSPIKE, SPEI   Chemistry      Component Value Date/Time   NA 133 (L) 01/21/2021 1343   K 3.9 01/21/2021 1343   CL 103 01/21/2021 1343   CO2 25 01/21/2021 1343   BUN 17 01/21/2021 1343   CREATININE 0.69 01/21/2021 1343      Component Value Date/Time   CALCIUM 10.4 (H) 01/21/2021  1343   ALKPHOS 182 (H) 01/21/2021 1343   AST 66 (H) 01/21/2021 1343   ALT 72 (H) 01/21/2021 1343   BILITOT 1.0 01/21/2021 1343       Impression and Plan:  [name] [name] is a very pleasant 76 yo female from [place] with hepatocellular carcinoma and Hepatitis C.  Labs, specifically the elevated LFT's, discussed with Dr. Marin Olp and we will proceed with treatment today.  We will repeat her scan in 2 weeks.  Follow-up in 2 weeks with MD.   Lottie Dawson, NP 1/4/20231:48 PM

## 2021-02-18 NOTE — Patient Instructions (Signed)

## 2021-02-19 ENCOUNTER — Ambulatory Visit (HOSPITAL_COMMUNITY)
Admission: RE | Admit: 2021-02-19 | Discharge: 2021-02-19 | Disposition: A | Discharge status: Home / Self Care | Payer: Self-pay | Source: Ambulatory Visit | Attending: Family | Admitting: Family

## 2021-02-19 DIAGNOSIS — C220 Liver cell carcinoma: Secondary | ICD-10-CM | POA: Insufficient documentation

## 2021-02-19 LAB — AFP TUMOR MARKER: AFP, Serum, Tumor Marker: 14.6 ng/mL — ABNORMAL HIGH (ref 0.0–9.2)

## 2021-02-19 IMAGING — MR MR ABDOMEN WO/W CM
19 series · 48 of 48 positions shown · IV contrast (GADAVIST)
Comparison: [DATE].

CLINICAL DATA: History of hepatocellular carcinoma in a 76-year-old
female. Currently on systemic therapy with immunotherapy.

EXAM:
MRI ABDOMEN WITHOUT AND WITH CONTRAST
TECHNIQUE: Multiplanar multisequence MR imaging of the abdomen was performed
both before and after the administration of intravenous contrast.
CONTRAST:  7mL GADAVIST GADOBUTROL 1 MMOL/ML IV SOLN

[Series 3: T2 · coronal · 6.0mm · 1.48mm/px · 2 of 30 slices shown (1 of 2)]
[im 1/30]
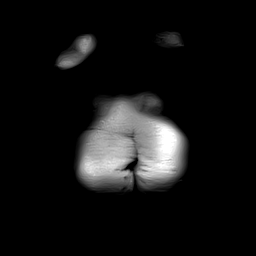
[im 30/30]
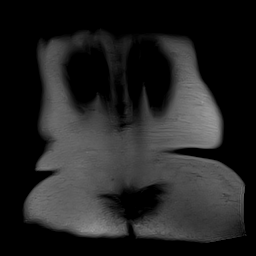

[Series 4: T2 fat-sat · axial · 6.0mm · 1.41mm/px · z∈[-98,+197]mm · 2 of 42 slices shown (1 of 2)]
[im 1/42]
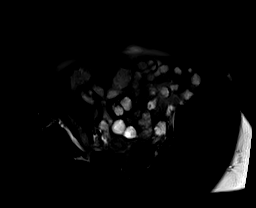
[im 42/42]
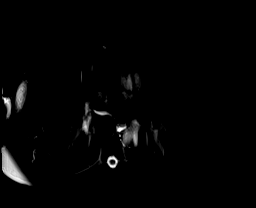

[Series 6: T2 fat-sat · axial · 6.0mm · 1.19mm/px · 1 of 36 slices shown (2 of 2)]
[im 1/36]
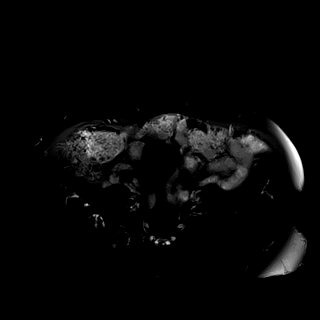

[Series 7: T1 · axial · 3.0mm · 1.19mm/px · z∈[-58,+179]mm · 3 of 80 slices shown (1 of 2)]
[im 1/80]
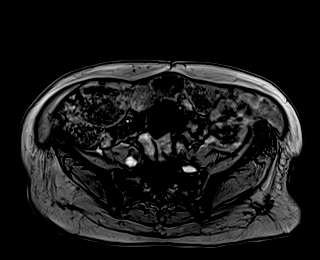
[im 40/80]
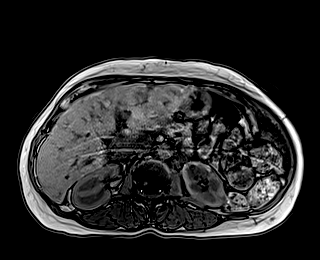
[im 80/80]
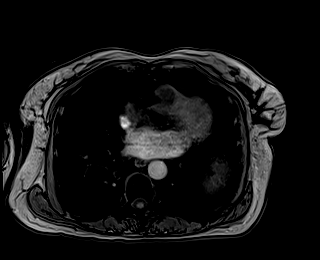

[Series 8: T1 · axial · 3.0mm · 1.19mm/px · z∈[-58,+179]mm · 3 of 80 slices shown (2 of 2)]
[im 1/80]
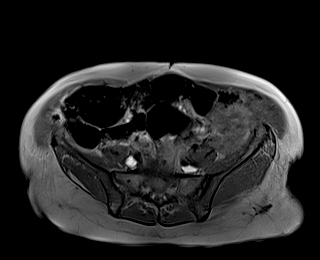
[im 40/80]
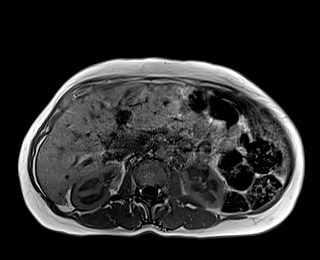
[im 80/80]
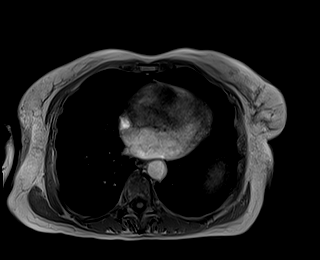

[Series 9: DWI · axial · 6.0mm · 1.42mm/px · z∈[-54,+198]mm · 3 of 72 slices shown (1 of 2)]
[im 1/72]
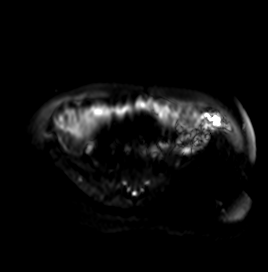
[im 36/72]
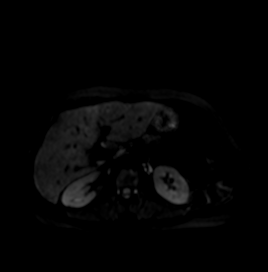
[im 72/72]
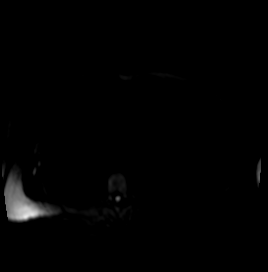

[Series 10: DWI · axial · 6.0mm · 1.42mm/px · 1 of 36 slices shown (2 of 2)]
[im 1/36]
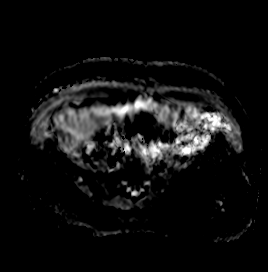

[Series 11: bSSFP · axial · 5.0mm · 0.74mm/px · z∈[-56,+186]mm · 2 of 45 slices shown]
[im 1/45]
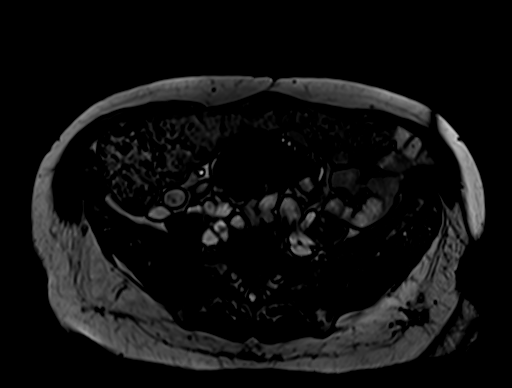
[im 45/45]
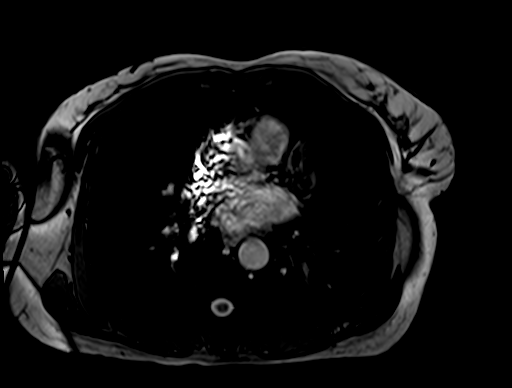

[Series 13: T1 dynamic · axial · 3.0mm · 1.19mm/px · z∈[-46,+191]mm · 3 of 80 slices shown (1 of 10)]
[im 1/80]
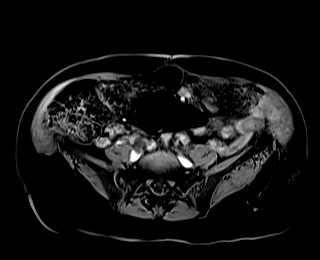
[im 40/80]
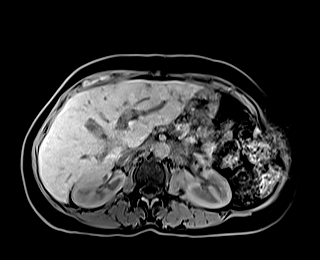
[im 80/80]
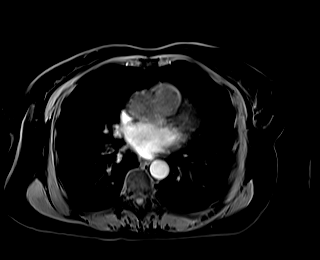

[Series 17: T1 dynamic · axial · 3.0mm · 1.19mm/px · z∈[-46,+191]mm · 3 of 80 slices shown (2 of 10)]
[im 1/80]
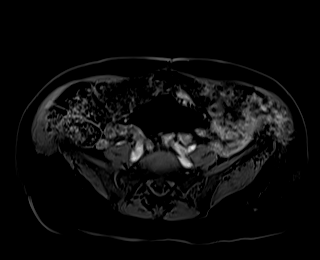
[im 40/80]
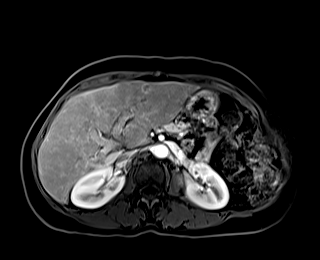
[im 80/80]
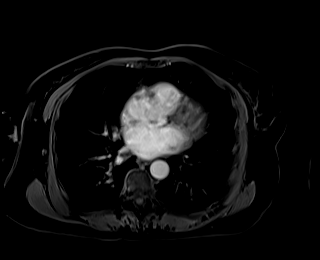

[Series 18: T1 dynamic · axial · 3.0mm · 1.19mm/px · z∈[-46,+191]mm · 3 of 80 slices shown (3 of 10)]
[im 1/80]
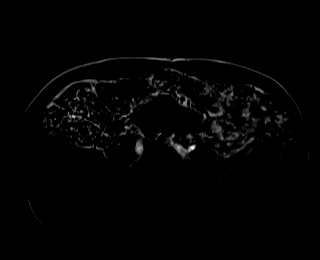
[im 40/80]
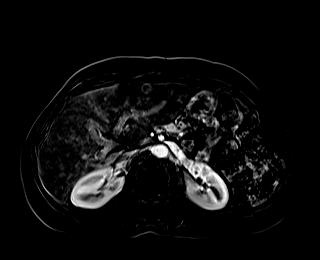
[im 80/80]
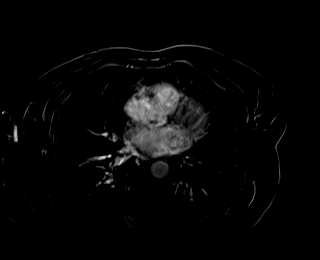

[Series 21: T1 dynamic · axial · 3.0mm · 1.19mm/px · z∈[-46,+191]mm · 3 of 80 slices shown (4 of 10)]
[im 1/80]
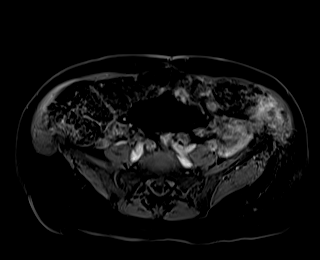
[im 40/80]
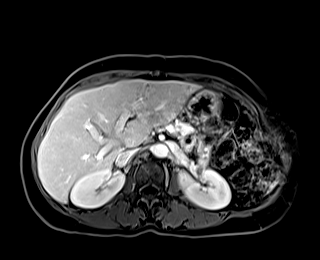
[im 80/80]
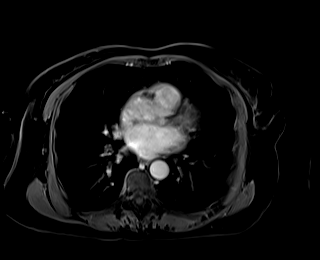

[Series 22: T1 dynamic · axial · 3.0mm · 1.19mm/px · z∈[-46,+191]mm · 3 of 80 slices shown (5 of 10)]
[im 1/80]
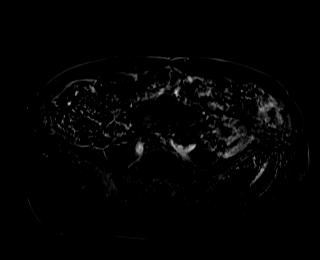
[im 40/80]
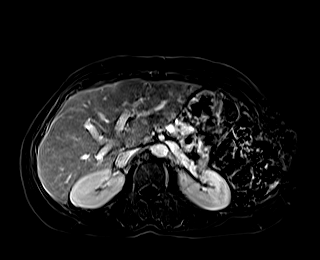
[im 80/80]
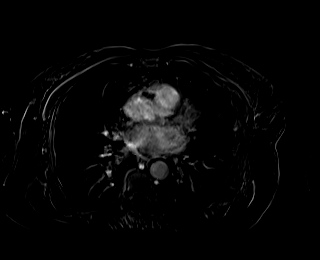

[Series 25: T1 dynamic · axial · 3.0mm · 1.19mm/px · z∈[-46,+191]mm · 3 of 80 slices shown (6 of 10)]
[im 1/80]
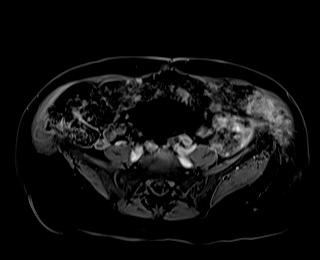
[im 40/80]
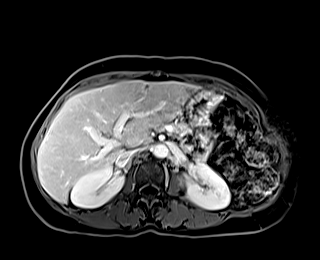
[im 80/80]
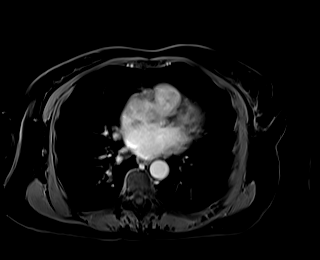

[Series 26: T1 dynamic · axial · 3.0mm · 1.19mm/px · z∈[-46,+191]mm · 3 of 80 slices shown (7 of 10)]
[im 1/80]
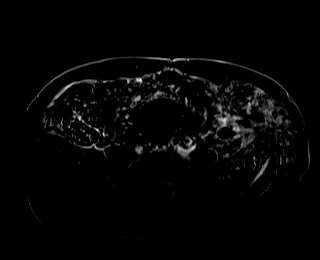
[im 40/80]
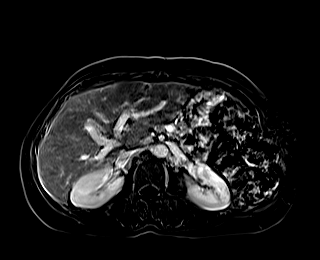
[im 80/80]
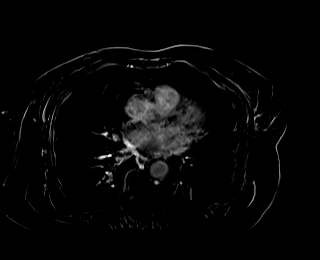

[Series 28: T1 dynamic · coronal · 3.1mm · 1.41mm/px · 3 of 80 slices shown (8 of 10)]
[im 1/80]
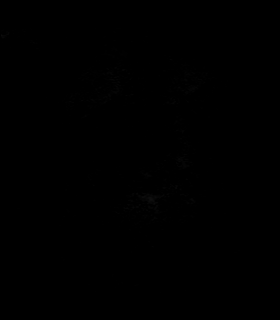
[im 40/80]
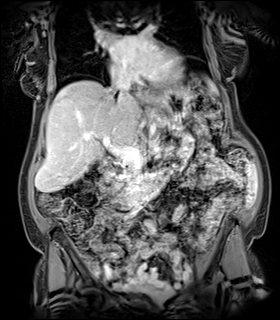
[im 80/80]
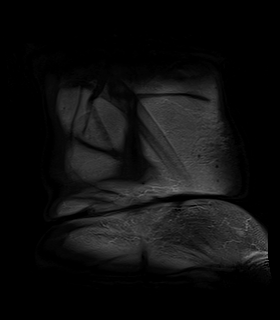

[Series 29: T2 · axial · 6.0mm · 1.48mm/px · 1 of 30 slices shown (2 of 2)]
[im 1/30]
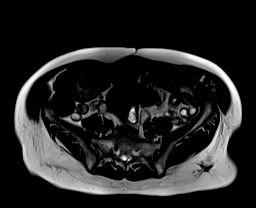

[Series 32: T1 dynamic · axial · 3.0mm · 1.19mm/px · z∈[-46,+191]mm · 3 of 80 slices shown (9 of 10)]
[im 1/80]
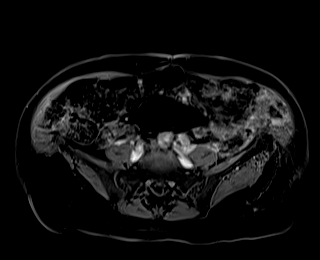
[im 40/80]
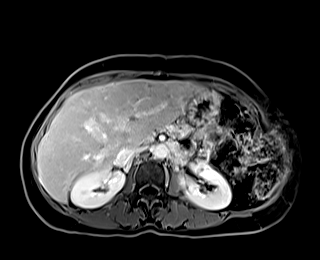
[im 80/80]
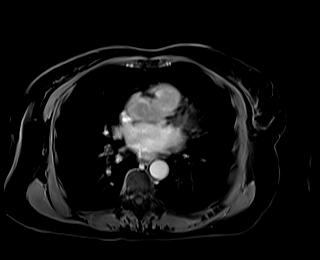

[Series 33: T1 dynamic · axial · 3.0mm · 1.19mm/px · z∈[-46,+191]mm · 3 of 80 slices shown (10 of 10)]
[im 1/80]
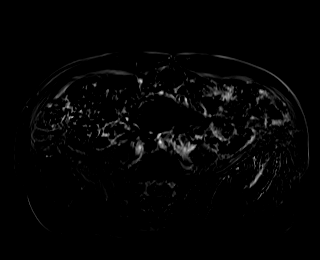
[im 40/80]
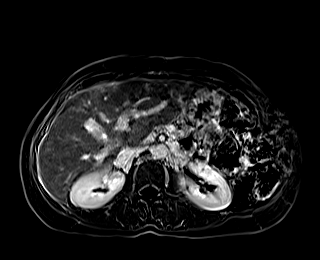
[im 80/80]
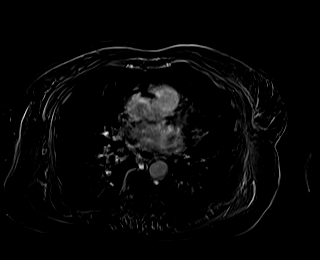

[48 of 48 positions shown; findings below may reference images not displayed]

FINDINGS: Lower chest: Incidental imaging of the lung bases is unremarkable,
limited assessment on MRI.

Hepatobiliary: Marked interval response to therapy. Liver with
cirrhotic morphology as before.

(Image 56/17) 2.8 x 2.5 cm lesion in the inferior aspect lateral
segment LEFT hepatic lobe, hepatic subsegment III with very little
internal enhancement and at baseline some internal T1 hyperintensity
which makes assessment on subtraction imaging due to misregistration
somewhat challenging. Internal nodule with definite enhancement
measuring 10 x 9 mm along the posterior wall. This area previously
measured 6.1 x 5.7 cm.

In general there is a heterogeneous appearance of liver enhancement
with numerous smaller foci showing diminished size.

(Image 47/21) 11 mm lesion in the medial segment previously 17 mm.

Tiny lesions scattered throughout mainly the LEFT hepatic lobe all
with decrease in size. This is best seen in coronal projection on
series 28 portal vein remains patent.

No signs of biliary duct dilation or pericholecystic stranding.

Pancreas: Pancreas grossly unremarkable with signs of pancreatic
atrophy. No signs of adjacent stranding or ductal dilation.

Spleen:  Splenomegaly as before.

Adrenals/Urinary Tract: Normal appearance of kidneys and adrenal
glands.

Stomach/Bowel: Moderate gastric distension of the gastric antrum. No
adjacent stranding. Stool throughout the colon. No acute
gastrointestinal process to the extent evaluated on this abdominal
MRI.

Vascular/Lymphatic: Patent abdominal vessels. No signs of
adenopathy.

Other: Trace ascites. Resolution of third spacing since the prior
study.

Musculoskeletal: No suspicious bone lesions identified.
IMPRESSION: 1. Marked interval response to therapy with diminished size of all
visible lesions, near complete resolution of most lesions aside from
the largest lesion which likely a small amount of residual viable
tumor.
2. No new lesions.
3. Signs of cirrhosis and portal hypertension.
4. Cholelithiasis.
5. Diminished ascites with only trace ascites and with resolution of
third spacing that was seen previously.

## 2021-02-19 MED ORDER — GADOBUTROL 1 MMOL/ML IV SOLN
7.0000 mL | Freq: Once | INTRAVENOUS | Status: AC | PRN
  Administered 2021-02-19: 7 mL via INTRAVENOUS

## 2021-02-20 ENCOUNTER — Telehealth: Payer: Self-pay | Admitting: *Deleted

## 2021-02-20 NOTE — Telephone Encounter (Signed)
Called and gave upcoming appointments - confirmed.

## 2021-02-20 NOTE — Telephone Encounter (Signed)
-----   Message from Volanda Napoleon, MD sent at 02/20/2021 11:38 AM EST ----- Please call and let her know that the cancer is much smaller.  She has responded incredibly well to the treatment.  I am so happy for her.  This is God at work!!!  Film/video editor

## 2021-02-20 NOTE — Telephone Encounter (Signed)
As noted below by Dr. Marin Olp, I informed the patient and her daughter that the cancer is much smaller. You have responded very well to the treatment. She and the daughter verbalized understanding.

## 2021-02-24 ENCOUNTER — Other Ambulatory Visit: Payer: Self-pay

## 2021-02-24 ENCOUNTER — Other Ambulatory Visit: Payer: Self-pay | Admitting: Internal Medicine

## 2021-02-24 ENCOUNTER — Encounter: Payer: Self-pay | Admitting: Hematology & Oncology

## 2021-02-24 NOTE — Telephone Encounter (Signed)
Requested medication (s) are due for refill today: yes  Requested medication (s) are on the active medication list: yes  Last refill:  10/31/20 #60 3 refills  Future visit scheduled: no  Notes to clinic:  [place]      Requested Prescriptions  Pending Prescriptions Disp Refills   pantoprazole (PROTONIX) 40 MG tablet 60 tablet 3    Sig: Take 1 tablet (40 mg total) by mouth 2 (two) times daily.     Gastroenterology: Proton Pump Inhibitors Passed - 02/24/2021  9:35 AM      Passed - Valid encounter within last 12 months    Recent Outpatient Visits           4 weeks ago Need for zoster vaccination   [place], RPH-CPP   2 months ago Type 2 diabetes mellitus with peripheral neuropathy [facility])   [place], MD   5 months ago Controlled type 2 diabetes mellitus without complication, with long-term current use of insulin [facility])   [place], MD   5 months ago Primary hypertension   [place] Kerin Perna, NP

## 2021-02-25 ENCOUNTER — Encounter: Payer: Self-pay | Admitting: Hematology & Oncology

## 2021-02-25 ENCOUNTER — Other Ambulatory Visit: Payer: Self-pay | Admitting: Internal Medicine

## 2021-02-25 ENCOUNTER — Other Ambulatory Visit: Payer: Self-pay

## 2021-02-25 MED ORDER — PANTOPRAZOLE SODIUM 40 MG PO TBEC
40.0000 mg | DELAYED_RELEASE_TABLET | Freq: Two times a day (BID) | ORAL | 3 refills | Status: DC
  Filled 2021-02-25: qty 60, 30d supply, fill #0
  Filled 2021-03-24: qty 60, 30d supply, fill #1
  Filled 2021-05-14: qty 60, 30d supply, fill #2
  Filled 2021-06-15: qty 60, 30d supply, fill #3

## 2021-02-25 NOTE — Telephone Encounter (Signed)
Requested Prescriptions  Pending Prescriptions Disp Refills   pantoprazole (PROTONIX) 40 MG tablet 60 tablet 3    Sig: Take 1 tablet (40 mg total) by mouth 2 (two) times daily.     Gastroenterology: Proton Pump Inhibitors Passed - 02/25/2021  8:25 AM      Passed - Valid encounter within last 12 months    Recent Outpatient Visits          4 weeks ago Need for zoster vaccination   [place], RPH-CPP   2 months ago Type 2 diabetes mellitus with peripheral neuropathy [facility])   [place], MD   5 months ago Controlled type 2 diabetes mellitus without complication, with long-term current use of insulin [facility])   [place], MD   5 months ago Primary hypertension   [place] Kerin Perna, NP

## 2021-02-27 ENCOUNTER — Other Ambulatory Visit: Payer: Self-pay

## 2021-02-27 ENCOUNTER — Other Ambulatory Visit: Payer: Self-pay | Admitting: Hematology & Oncology

## 2021-03-04 ENCOUNTER — Ambulatory Visit: Payer: Self-pay | Admitting: Nurse Practitioner

## 2021-03-04 ENCOUNTER — Encounter: Payer: Self-pay | Admitting: Family

## 2021-03-04 ENCOUNTER — Other Ambulatory Visit: Payer: Self-pay

## 2021-03-04 ENCOUNTER — Inpatient Hospital Stay: Payer: Self-pay

## 2021-03-04 ENCOUNTER — Ambulatory Visit: Payer: Self-pay

## 2021-03-04 ENCOUNTER — Inpatient Hospital Stay (HOSPITAL_BASED_OUTPATIENT_CLINIC_OR_DEPARTMENT_OTHER): Payer: Self-pay | Admitting: Family

## 2021-03-04 VITALS — BP 133/52 | HR 80 | Temp 98.4°F | Resp 18 | Wt 142.0 lb

## 2021-03-04 DIAGNOSIS — E032 Hypothyroidism due to medicaments and other exogenous substances: Secondary | ICD-10-CM

## 2021-03-04 DIAGNOSIS — C220 Liver cell carcinoma: Secondary | ICD-10-CM

## 2021-03-04 LAB — CBC WITH DIFFERENTIAL (CANCER CENTER ONLY)
Abs Immature Granulocytes: 0.01 10*3/uL (ref 0.00–0.07)
Basophils Absolute: 0 10*3/uL (ref 0.0–0.1)
Basophils Relative: 1 %
Eosinophils Absolute: 0.1 10*3/uL (ref 0.0–0.5)
Eosinophils Relative: 2 %
HCT: 36.7 % (ref 36.0–46.0)
Hemoglobin: 12.2 g/dL (ref 12.0–15.0)
Immature Granulocytes: 0 %
Lymphocytes Relative: 42 %
Lymphs Abs: 1.9 10*3/uL (ref 0.7–4.0)
MCH: 30.7 pg (ref 26.0–34.0)
MCHC: 33.2 g/dL (ref 30.0–36.0)
MCV: 92.4 fL (ref 80.0–100.0)
Monocytes Absolute: 0.3 10*3/uL (ref 0.1–1.0)
Monocytes Relative: 7 %
Neutro Abs: 2.1 10*3/uL (ref 1.7–7.7)
Neutrophils Relative %: 48 %
Platelet Count: 169 10*3/uL (ref 150–400)
RBC: 3.97 MIL/uL (ref 3.87–5.11)
RDW: 15.4 % (ref 11.5–15.5)
WBC Count: 4.4 10*3/uL (ref 4.0–10.5)
nRBC: 0 % (ref 0.0–0.2)

## 2021-03-04 LAB — CMP (CANCER CENTER ONLY)
ALT: 120 U/L — ABNORMAL HIGH (ref 0–44)
AST: 109 U/L — ABNORMAL HIGH (ref 15–41)
Albumin: 3.7 g/dL (ref 3.5–5.0)
Alkaline Phosphatase: 190 U/L — ABNORMAL HIGH (ref 38–126)
Anion gap: 5 (ref 5–15)
BUN: 16 mg/dL (ref 8–23)
CO2: 27 mmol/L (ref 22–32)
Calcium: 10.7 mg/dL — ABNORMAL HIGH (ref 8.9–10.3)
Chloride: 107 mmol/L (ref 98–111)
Creatinine: 0.62 mg/dL (ref 0.44–1.00)
GFR, Estimated: >60 mL/min (ref >60)
Glucose, Bld: 142 mg/dL — ABNORMAL HIGH (ref 70–99)
Potassium: 4 mmol/L (ref 3.5–5.1)
Sodium: 139 mmol/L (ref 135–145)
Total Bilirubin: 1 mg/dL (ref 0.3–1.2)
Total Protein: 7.6 g/dL (ref 6.5–8.1)

## 2021-03-04 LAB — LACTATE DEHYDROGENASE: LDH: 177 U/L (ref 98–192)

## 2021-03-04 LAB — TSH: TSH: 1.661 u[IU]/mL (ref 0.308–3.960)

## 2021-03-04 MED ORDER — SODIUM CHLORIDE 0.9 % IV SOLN
240.0000 mg | Freq: Once | INTRAVENOUS | Status: AC
  Administered 2021-03-04: 240 mg via INTRAVENOUS
  Filled 2021-03-04: qty 24

## 2021-03-04 MED ORDER — SODIUM CHLORIDE 0.9 % IV SOLN: Freq: Once | INTRAVENOUS | Status: AC

## 2021-03-04 MED ORDER — HEPARIN SOD (PORK) LOCK FLUSH 100 UNIT/ML IV SOLN
500.0000 [IU] | Freq: Once | INTRAVENOUS | Status: AC | PRN
  Administered 2021-03-04: 500 [IU]

## 2021-03-04 MED ORDER — SODIUM CHLORIDE 0.9% FLUSH
10.0000 mL | INTRAVENOUS | Status: DC | PRN
  Administered 2021-03-04: 10 mL

## 2021-03-04 NOTE — Progress Notes (Signed)
Hematology and Oncology Follow Up Visit  [name] [name] [id number] [birth year] 76 y.o. 03/04/2021   Principle Diagnosis:  Hepatocellular carcinoma-multifocal Hepatitis C   Current Therapy:        Status post cycle 1 of nivolumab/ipilimumab -- d/c on 10/13/2020 due to hepatic toxicity Single agent Nivolumab 240 mg IV every 2 weeks - started 11/21/2020   Interim History:  [name] [name] is here today for follow-up and treatment. She is doing quite well and has no complaints at this time.  Her MRI of the abdomen 2 weeks ago showed a nice response to therapy.  AFP at that time was 14.6.  Her LFTs remain similarly elevated.  No fever, chills, n/v, cough, rash, dizziness, SOB, chest pain, abdominal pain or changes in bowel or bladder habits.  No blood loss noted. No bruising or petechiae.  Overall she feels that her energy is good.  No swelling or tenderness in her extremities.  Neuropathy in her feet is unchanged from baseline. Pedal pulses are 2+.  No falls or syncope to report.  She is eating well with a good appetite and is staying hydrated throughout the day. Her weight is stable at 142 lbs.   ECOG Performance Status: 1 - Symptomatic but completely ambulatory  Medications:  Allergies as of 03/04/2021       Reactions   Amoxicillin Swelling, Rash   Daughter is unsure if patient is allergic to Amoxicillin or Clarithromycin.   Bactrim [sulfamethoxazole-trimethoprim] Swelling   Clarithromycin Swelling, Rash   Daughter is unsure if Clarithromycin or Amoxicillin caused rash and swelling.        Medication List        Accurate as of March 04, 2021  9:59 AM. If you have any questions, ask your nurse or doctor.          brimonidine 0.1 % Soln Commonly known as: ALPHAGAN P Place 1 drop into both eyes in the morning and at bedtime.   brimonidine 0.2 % ophthalmic solution Commonly known as: ALPHAGAN 1 drop 2 (two) times daily.   influenza vaccine  adjuvanted 0.5 ML injection Commonly known as: FLUAD Inject into the muscle.   losartan 50 MG tablet Commonly known as: COZAAR Take 1 tablet (50 mg total) by mouth at bedtime.   NIFEdipine 30 MG 24 hr tablet Commonly known as: PROCARDIA-XL/NIFEDICAL-XL Take 1 tablet (30 mg total) by mouth in the morning and at bedtime.   NovoLIN 70/30 (70-30) 100 UNIT/ML injection Generic drug: insulin NPH-regular Human Inject 20 units into the skin every morning and 5 units every afternoon with meals   pantoprazole 40 MG tablet Commonly known as: Protonix Take 1 tablet (40 mg total) by mouth 2 (two) times daily.   PRESCRIPTION MEDICATION Inject 20 Units as directed in the morning and at bedtime. Rx from [place]   True Metrix Blood Glucose Test test strip Generic drug: glucose blood Use as instructed        Allergies:  Allergies  Allergen Reactions   Amoxicillin Swelling and Rash    Daughter is unsure if patient is allergic to Amoxicillin or Clarithromycin.   Bactrim [Sulfamethoxazole-Trimethoprim] Swelling   Clarithromycin Swelling and Rash    Daughter is unsure if Clarithromycin or Amoxicillin caused rash and swelling.    Past Medical History, Surgical history, Social history, and Family History were reviewed and updated.  Review of Systems: All other 10 point review of systems is negative.   Physical Exam:  weight is 142 lb (64.4 kg). Her  oral temperature is 98.4 F (36.9 C). Her blood pressure is 133/52 (abnormal) and her pulse is 80. Her respiration is 18 and oxygen saturation is 100%.   Wt Readings from Last 3 Encounters:  03/04/21 142 lb (64.4 kg)  02/18/21 142 lb (64.4 kg)  12/26/20 137 lb (62.1 kg)    Ocular: Sclerae unicteric, pupils equal, round and reactive to light Ear-nose-throat: Oropharynx clear, dentition fair Lymphatic: No cervical or supraclavicular adenopathy Lungs no rales or rhonchi, good excursion bilaterally Heart regular rate and rhythm, no murmur  appreciated Abd soft, nontender, positive bowel sounds MSK no focal spinal tenderness, no joint edema Neuro: non-focal, well-oriented, appropriate affect Breasts: Deferred   Lab Results  Component Value Date   WBC 4.4 03/04/2021   HGB 12.2 03/04/2021   HCT 36.7 03/04/2021   MCV 92.4 03/04/2021   PLT 169 03/04/2021   Lab Results  Component Value Date   FERRITIN 603 (H) 10/02/2020   IRON 31 (L) 10/02/2020   TIBC 241 10/02/2020   UIBC 211 10/02/2020   IRONPCTSAT 13 (L) 10/02/2020   Lab Results  Component Value Date   RBC 3.97 03/04/2021   No results found for: KPAFRELGTCHN, LAMBDASER, KAPLAMBRATIO No results found for: IGGSERUM, IGA, IGMSERUM No results found for: Ronnald Ramp, A1GS, Lenoria Farrier, GAMS, MSPIKE, SPEI   Chemistry      Component Value Date/Time   NA 132 (L) 02/18/2021 1338   K 3.8 02/18/2021 1338   CL 102 02/18/2021 1338   CO2 25 02/18/2021 1338   BUN 19 02/18/2021 1338   CREATININE 0.62 02/18/2021 1338      Component Value Date/Time   CALCIUM 10.6 (H) 02/18/2021 1338   ALKPHOS 192 (H) 02/18/2021 1338   AST 110 (H) 02/18/2021 1338   ALT 109 (H) 02/18/2021 1338   BILITOT 1.3 (H) 02/18/2021 1338       Impression and Plan: [name] is a very pleasant 76 yo female from [place] with hepatocellular carcinoma and Hepatitis C.  We will proceed with treatment today as planned.  TSH and AFP pending.  Follow-up in 2 weeks with MD.   Lottie Dawson, NP 1/18/20239:59 AM

## 2021-03-04 NOTE — Progress Notes (Signed)
OK to treat with today's lab values per Lottie Dawson, NP.

## 2021-03-04 NOTE — Patient Instructions (Signed)
[facility]  Discharge Instructions: Thank you for choosing [facility] to provide your oncology and hematology care.   If you have a lab appointment with the Cancer Center, please go directly to the Cancer Center and check in at the registration area.  Wear comfortable clothing and clothing appropriate for easy access to any Portacath or PICC line.   We strive to give you quality time with your provider. You may need to reschedule your appointment if you arrive late (15 or more minutes).  Arriving late affects you and other patients whose appointments are after yours.  Also, if you miss three or more appointments without notifying the office, you may be dismissed from the clinic at the provider's discretion.      For prescription refill requests, have your pharmacy contact our office and allow 72 hours for refills to be completed.    Today you received the following chemotherapy and/or immunotherapy agents:  Opdivo      To help prevent nausea and vomiting after your treatment, we encourage you to take your nausea medication as directed.  BELOW ARE SYMPTOMS THAT SHOULD BE REPORTED IMMEDIATELY: *FEVER GREATER THAN 100.4 F (38 C) OR HIGHER *CHILLS OR SWEATING *NAUSEA AND VOMITING THAT IS NOT CONTROLLED WITH YOUR NAUSEA MEDICATION *UNUSUAL SHORTNESS OF BREATH *UNUSUAL BRUISING OR BLEEDING *URINARY PROBLEMS (pain or burning when urinating, or frequent urination) *BOWEL PROBLEMS (unusual diarrhea, constipation, pain near the anus) TENDERNESS IN MOUTH AND THROAT WITH OR WITHOUT PRESENCE OF ULCERS (sore throat, sores in mouth, or a toothache) UNUSUAL RASH, SWELLING OR PAIN  UNUSUAL VAGINAL DISCHARGE OR ITCHING   Items with * indicate a potential emergency and should be followed up as soon as possible or go to the Emergency Department if any problems should occur.  Please show the CHEMOTHERAPY ALERT CARD or IMMUNOTHERAPY ALERT CARD at check-in to the  Emergency Department and triage nurse. Should you have questions after your visit or need to cancel or reschedule your appointment, please contact [facility]  [phone] and follow the prompts.  Office hours are 8:00 a.m. to 4:30 p.m. Monday - Friday. Please note that voicemails left after 4:00 p.m. may not be returned until the following business day.  We are closed weekends and major holidays. You have access to a nurse at all times for urgent questions. Please call the main number to the clinic [phone] and follow the prompts.  For any non-urgent questions, you may also contact your provider using MyChart. We now offer e-Visits for anyone 18 and older to request care online for non-urgent symptoms. For details visit [email or web address].   Also download the MyChart app! Go to the app store, search "MyChart", open the app, select [facility], and log in with your MyChart username and password.  Due to Covid, a mask is required upon entering the hospital/clinic. If you do not have a mask, one will be given to you upon arrival. For doctor visits, patients may have 1 support person aged 18 or older with them. For treatment visits, patients cannot have anyone with them due to current Covid guidelines and our immunocompromised population.  

## 2021-03-05 ENCOUNTER — Encounter: Payer: Self-pay | Admitting: Hematology & Oncology

## 2021-03-05 ENCOUNTER — Ambulatory Visit: Payer: Self-pay | Admitting: Family

## 2021-03-05 ENCOUNTER — Other Ambulatory Visit: Payer: Self-pay

## 2021-03-05 ENCOUNTER — Ambulatory Visit
Admission: RE | Admit: 2021-03-05 | Discharge: 2021-03-05 | Disposition: A | Discharge status: Home / Self Care | Payer: No Typology Code available for payment source | Source: Ambulatory Visit | Attending: Obstetrics and Gynecology | Admitting: Obstetrics and Gynecology

## 2021-03-05 ENCOUNTER — Ambulatory Visit: Payer: Self-pay

## 2021-03-05 DIAGNOSIS — Z1231 Encounter for screening mammogram for malignant neoplasm of breast: Secondary | ICD-10-CM

## 2021-03-05 LAB — AFP TUMOR MARKER: AFP, Serum, Tumor Marker: 14.8 ng/mL — ABNORMAL HIGH (ref 0.0–9.2)

## 2021-03-05 IMAGING — MG MM DIGITAL SCREENING BILAT W/ TOMO AND CAD
8 series · 9 of 24 positions shown · non-contrast
Comparison: None.

CLINICAL DATA: Screening.

EXAM:
DIGITAL SCREENING BILATERAL MAMMOGRAM WITH TOMOSYNTHESIS AND CAD
TECHNIQUE: Bilateral screening digital craniocaudal and mediolateral oblique
mammograms were obtained. Bilateral screening digital breast
tomosynthesis was performed. The images were evaluated with
computer-aided detection.

[R CC synth-2D]
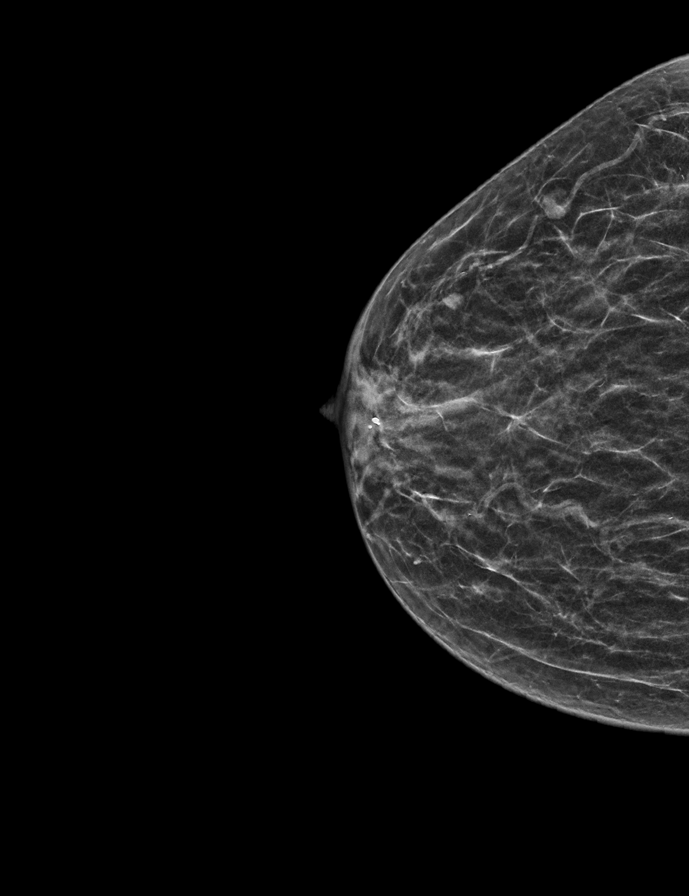

[L CC synth-2D]
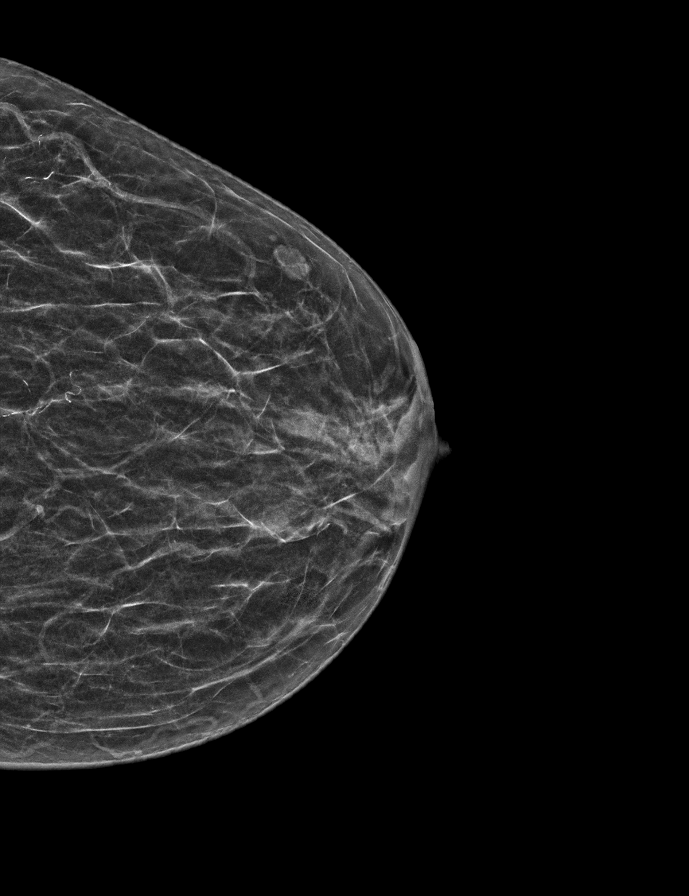

[R MLO synth-2D]
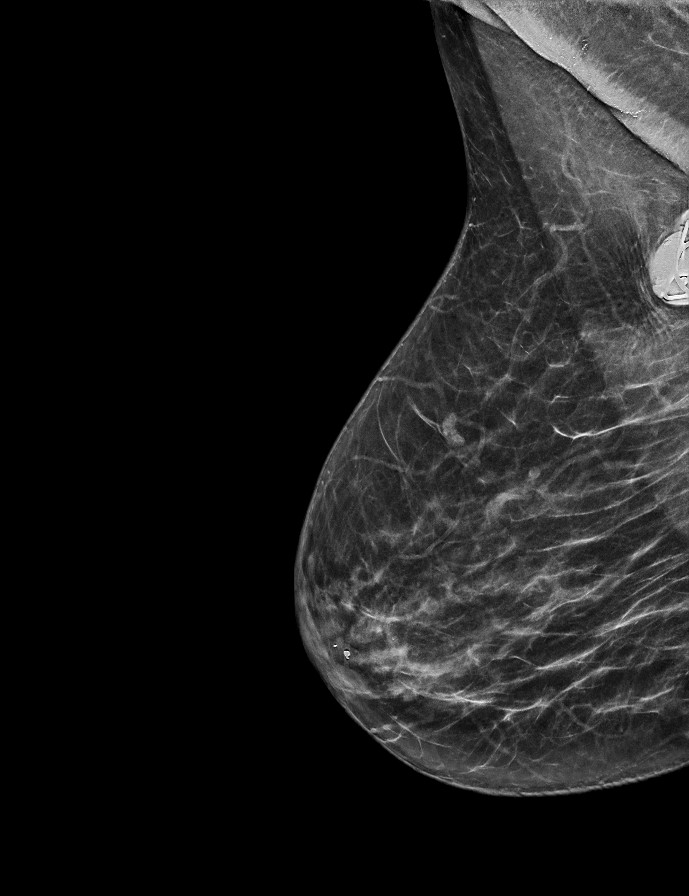

[L MLO synth-2D]
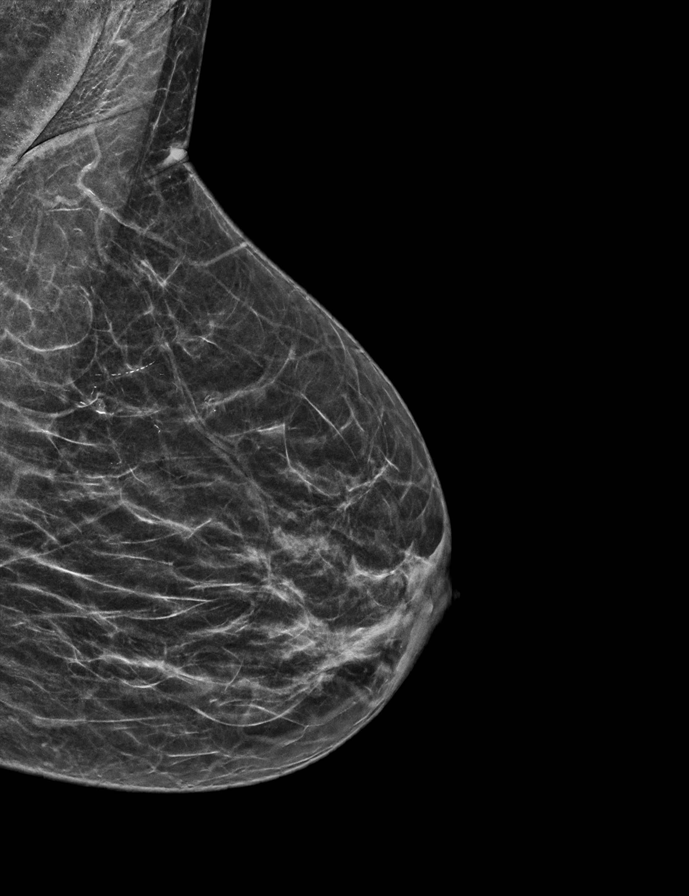

[R CC tomo · 2 of 40 frames shown]
[frame 13/40]
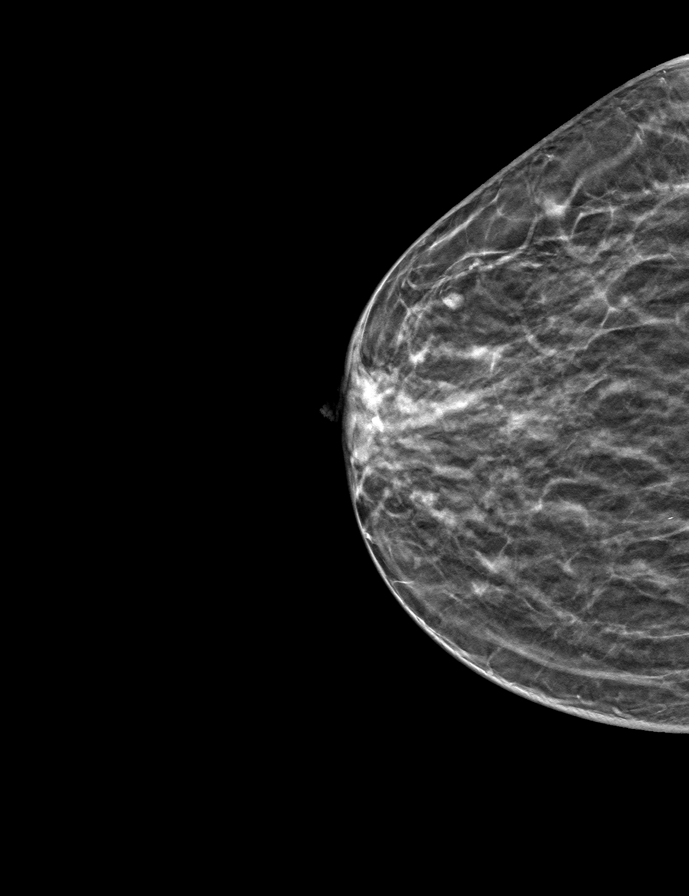
[frame 21/40]
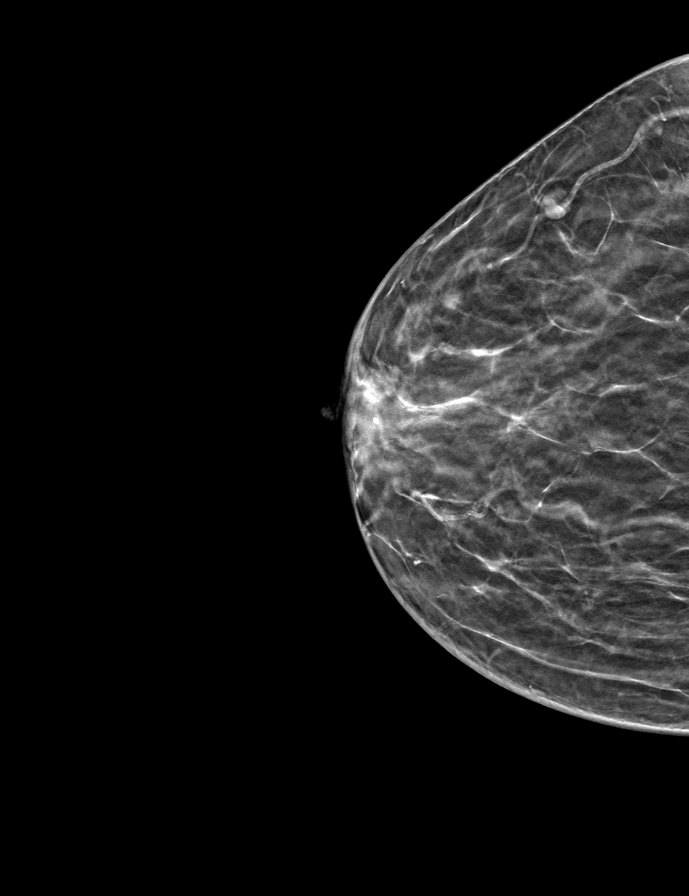

[L MLO tomo · tomo slice 25/48.0]
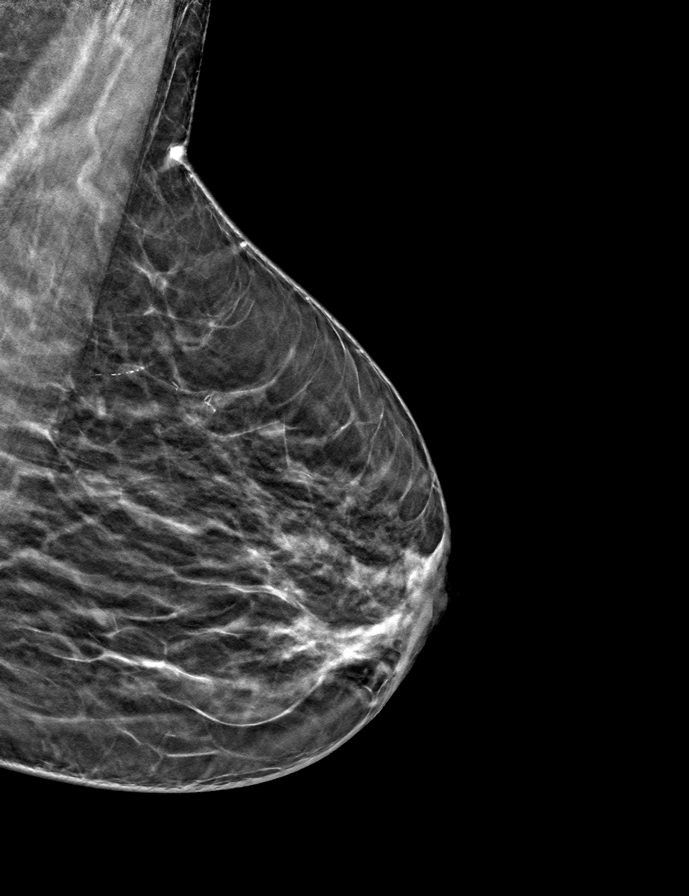

[L CC tomo · tomo slice 21/41.0]
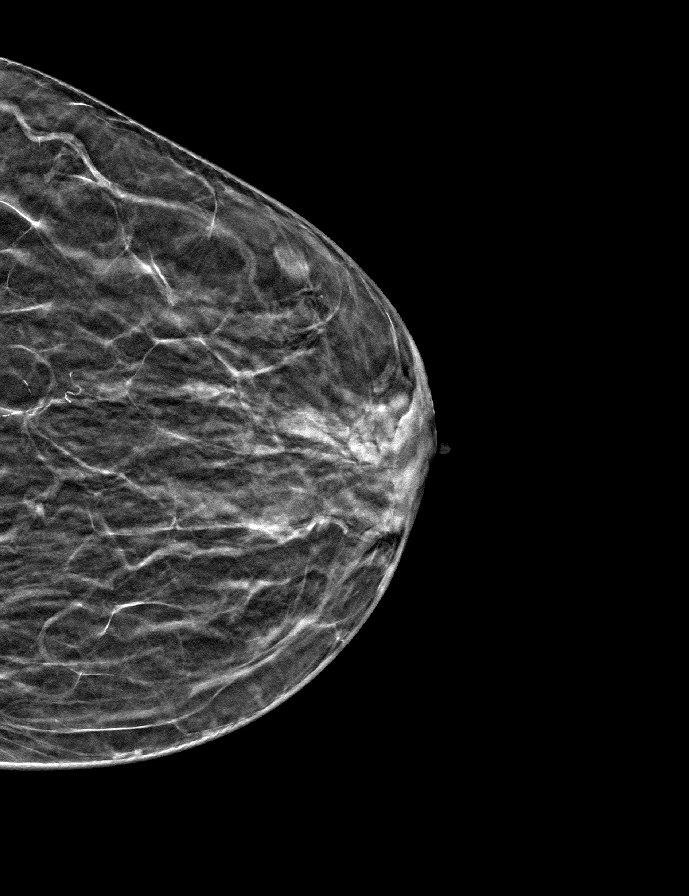

[R MLO tomo · tomo slice 28/55.0]
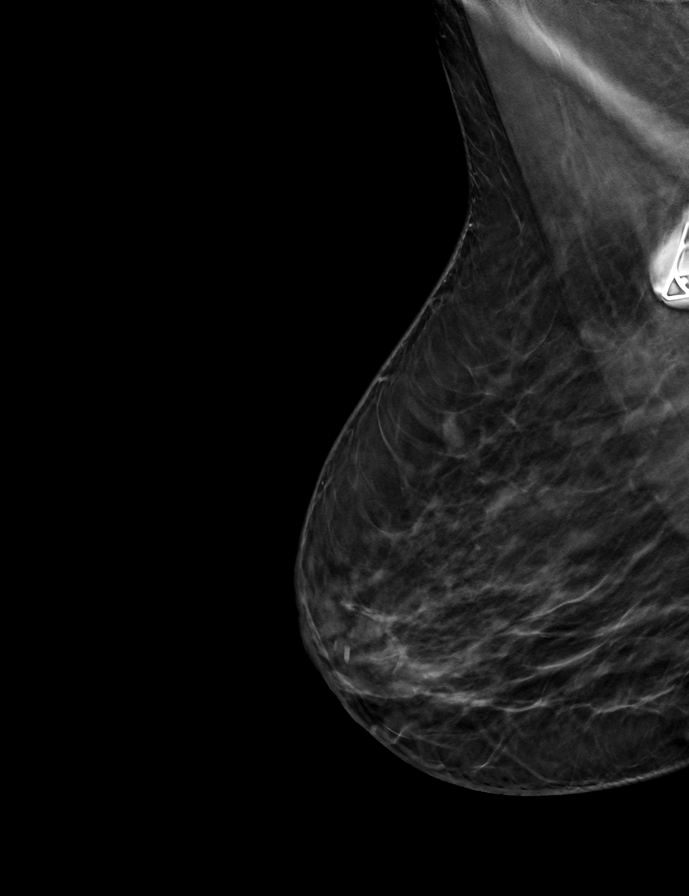

[9 of 24 positions shown; findings below may reference images not displayed]

ACR Breast Density Category b: There are scattered areas of
fibroglandular density.
FINDINGS: There are no findings suspicious for malignancy.
IMPRESSION: No mammographic evidence of malignancy. A result letter of this
screening mammogram will be mailed directly to the patient.

RECOMMENDATION:
Screening mammogram in one year. (Code:[4Q])

BI-RADS CATEGORY  1: Negative.

## 2021-03-16 ENCOUNTER — Encounter: Payer: Self-pay | Admitting: Hematology & Oncology

## 2021-03-16 ENCOUNTER — Other Ambulatory Visit: Payer: Self-pay

## 2021-03-17 ENCOUNTER — Ambulatory Visit (INDEPENDENT_AMBULATORY_CARE_PROVIDER_SITE_OTHER): Payer: Self-pay | Admitting: Gastroenterology

## 2021-03-17 ENCOUNTER — Encounter: Payer: Self-pay | Admitting: Gastroenterology

## 2021-03-17 ENCOUNTER — Other Ambulatory Visit: Payer: Self-pay

## 2021-03-17 VITALS — BP 112/50 | HR 84 | Ht 64.25 in | Wt 146.2 lb

## 2021-03-17 DIAGNOSIS — K275 Chronic or unspecified peptic ulcer, site unspecified, with perforation: Secondary | ICD-10-CM

## 2021-03-17 DIAGNOSIS — B1920 Unspecified viral hepatitis C without hepatic coma: Secondary | ICD-10-CM

## 2021-03-17 DIAGNOSIS — Z1211 Encounter for screening for malignant neoplasm of colon: Secondary | ICD-10-CM

## 2021-03-17 DIAGNOSIS — K5909 Other constipation: Secondary | ICD-10-CM

## 2021-03-17 NOTE — Progress Notes (Signed)
Referring Provider: Ladell Pier, MD Primary Care Physician:  Ladell Pier, MD  Reason for Consultation:  Perforated abdominal ulcer   IMPRESSION:  Perforated ulcer repair: Surgeon requested follow-up EGD.  Personal history of colon cancer: No prior surveillance since resection in 2004. She would like to proceed with colonoscopy now.   Chronic constipation: Better since starting immunotherapy.   PLAN: Obtain records from [facility] EGD Colonoscopy Follow-up with ID re: HCV treatment   HPI: [name] [name] is a 76 y.o. female referred by surgery. The history is obtained through the patient, her  daughter who provides interpretation, and review of her electronic health record. 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 Diagnosed with colon cancer while in [place] in 2004 treated with surgery and chemotherapy.   Perforated gastric ulcer s/p lap omental Graham patch 08/09/2020.  Noted to have liver masses intraoperatively which were biopsied and confirmed to be hepatocellular carcinoma.  She was also found to be positive for HCV.  Her postoperative course was uneventful. Referred to [facility] for an EGD to follow-up on her ulcer repair. She would like to have them performed here due to cost and insurance concerns.   She is currently receiving immunotherapy for her [place]. Treatment held once due to elevated LFTs.   Saw ID to consider HCV treatment. They recommended deferring treatment until [facility] is treated.   She has a long-standing history of chronic constipation.  Having one bowel movement daiy but she has a history of less frequent bowel movements in the past.  No history of colonoscopy. She would really like to have a colonoscopy now.   There is no known family history of colon cancer or polyps. No family history of stomach  cancer or other GI malignancy. No family history of inflammatory bowel disease or celiac.    Past Medical History:  Diagnosis Date   Colon cancer ([place])    Diabetes mellitus without complication ([place])    Glaucoma    Goals of care, counseling/discussion 09/01/2020   Hepatitis C    Hepatocellular carcinoma ([place])    Hypertension     Past Surgical History:  Procedure Laterality Date   COLON RESECTION  2005   In [place] - ?colon cancer   IR IMAGING GUIDED PORT INSERTION  09/18/2020   LAPAROSCOPIC GASTRIC RESECTION N/A 08/09/2020   Procedure: LAPAROSCOPIC EXPLORATION OF ABDOMEN, PERFORATED ULCER REPAIR, LIVER BIOPSY;  Surgeon: Michael Boston, MD;  Location: [facility];  Service: General;  Laterality: N/A;    Current Outpatient Medications  Medication Sig Dispense Refill   brimonidine (ALPHAGAN) 0.2 % ophthalmic solution 1 drop 2 (two) times daily.     glucose blood (TRUE METRIX BLOOD GLUCOSE TEST) test strip Use as instructed 100 each 12   insulin NPH-regular Human (NOVOLIN 70/30) (70-30) 100 UNIT/ML injection Inject 20 units into the skin every morning and 5 units every afternoon with meals 10 mL 3   losartan (COZAAR) 50 MG tablet Take 1 tablet (50 mg total) by mouth at bedtime. 30 tablet 6   NIFEdipine (PROCARDIA-XL/NIFEDICAL-XL) 30 MG 24 hr tablet Take 1 tablet (30 mg total) by mouth in the morning and at bedtime. 60 tablet 2   pantoprazole (PROTONIX) 40 MG tablet Take 1 tablet (40 mg total) by mouth 2 (two) times daily. 60 tablet 3   No current facility-administered medications for this visit.   Facility-Administered  Medications Ordered in Other Visits  Medication Dose Route Frequency Provider Last Rate Last Admin   sodium chloride flush (NS) 0.9 % injection 10 mL  10 mL Intravenous PRN Celso Amy, NP   10 mL at 12/18/20 1009    Allergies as of 03/17/2021 - Review Complete 03/17/2021  Allergen Reaction Noted   Amoxicillin Swelling and Rash 09/01/2020   Bactrim  [sulfamethoxazole-trimethoprim] Swelling 08/08/2020   Clarithromycin Swelling and Rash 09/01/2020    Family History  Problem Relation Age of Onset   Cancer Father        type unknown, was on the leg   Other Daughter        Acoustic neuroma   Breast cancer Neg Hx     Social History   Socioeconomic History   Marital status: Widowed    Spouse name: Not on file   Number of children: 5   Years of education: 14   Highest education level: Associate degree: occupational, Hotel manager, or vocational program  Occupational History   Not on file  Tobacco Use   Smoking status: Never   Smokeless tobacco: Never  Vaping Use   Vaping Use: Never used  Substance and Sexual Activity   Alcohol use: Not Currently   Drug use: Never   Sexual activity: Not Currently  Other Topics Concern   Not on file  Social History Narrative   Originally from [place].  Speaks [place] and [place]   Social Determinants of Radio broadcast assistant Strain: Not on [employer] Insecurity: Not on file  Transportation Needs: Not on file  Physical Activity: Not on file  Stress: Not on file  Social Connections: Not on file  Intimate Partner Violence: Not on file    Physical Exam: General:   Alert,  well-nourished, pleasant and cooperative in NAD Head:  Normocephalic and atraumatic. Eyes:  Sclera clear, no icterus.   Conjunctiva pink. Ears:  Normal auditory acuity. Nose:  No deformity, discharge,  or lesions. Mouth:  No deformity or lesions.   Neck:  Supple; no masses or thyromegaly. Lungs:  Clear throughout to auscultation.   No wheezes. Heart:  Regular rate and rhythm; no murmurs. Abdomen:  Soft, nontender, nondistended, normal bowel sounds, no rebound or guarding. No hepatosplenomegaly.   Rectal:  Deferred  Msk:  Symmetrical. No boney deformities LAD: No inguinal or umbilical LAD Extremities:  No clubbing or edema. Neurologic:  Alert and  oriented x4;  grossly nonfocal Skin:  Intact without  significant lesions or rashes. Psych:  Alert and cooperative. Normal mood and affect.    Dustee Bottenfield L. Tarri Glenn, MD, MPH 03/22/2021, 10:18 PM

## 2021-03-17 NOTE — Patient Instructions (Signed)
You have been scheduled for a colonoscopy. Please follow written instructions given to you at your visit today.  Please pick up your prep supplies at the pharmacy within the next 1-3 days. If you use inhalers (even only as needed), please bring them with you on the day of your procedure.  If you are age 75 or older, your body mass index should be between 23-30. Your Body mass index is 24.91 kg/m. If this is out of the aforementioned range listed, please consider follow up with your Primary Care Provider.   The [facility] providers would like to encourage you to use [facility] to communicate with providers for non-urgent requests or questions.  Due to long hold times on the telephone, sending your provider a message by [facility] may be a faster and more efficient way to get a response.  Please allow 48 business hours for a response.  Please remember that this is for non-urgent requests.   Due to recent changes in healthcare laws, you may see the results of your imaging and laboratory studies on MyChart before your provider has had a chance to review them.  We understand that in some cases there may be results that are confusing or concerning to you. Not all laboratory results come back in the same time frame and the provider may be waiting for multiple results in order to interpret others.  Please give [place] 48 hours in order for your provider to thoroughly review all the results before contacting the office for clarification of your results.   Thank you for choosing me and [facility].  Dr.Kimberly Beavers

## 2021-03-19 ENCOUNTER — Inpatient Hospital Stay: Payer: No Typology Code available for payment source

## 2021-03-19 ENCOUNTER — Encounter: Payer: Self-pay | Admitting: Hematology & Oncology

## 2021-03-19 ENCOUNTER — Inpatient Hospital Stay (HOSPITAL_BASED_OUTPATIENT_CLINIC_OR_DEPARTMENT_OTHER): Payer: Self-pay | Admitting: Hematology & Oncology

## 2021-03-19 ENCOUNTER — Inpatient Hospital Stay: Payer: No Typology Code available for payment source | Attending: Hematology & Oncology

## 2021-03-19 ENCOUNTER — Other Ambulatory Visit: Payer: Self-pay

## 2021-03-19 VITALS — BP 128/70 | HR 84 | Temp 97.5°F | Resp 18 | Ht 64.0 in | Wt 146.0 lb

## 2021-03-19 VITALS — BP 128/63 | HR 79 | Resp 18

## 2021-03-19 DIAGNOSIS — Z7984 Long term (current) use of oral hypoglycemic drugs: Secondary | ICD-10-CM | POA: Insufficient documentation

## 2021-03-19 DIAGNOSIS — C220 Liver cell carcinoma: Secondary | ICD-10-CM | POA: Insufficient documentation

## 2021-03-19 DIAGNOSIS — Z5112 Encounter for antineoplastic immunotherapy: Secondary | ICD-10-CM | POA: Insufficient documentation

## 2021-03-19 DIAGNOSIS — Z79899 Other long term (current) drug therapy: Secondary | ICD-10-CM | POA: Insufficient documentation

## 2021-03-19 DIAGNOSIS — Z95828 Presence of other vascular implants and grafts: Secondary | ICD-10-CM

## 2021-03-19 DIAGNOSIS — E119 Type 2 diabetes mellitus without complications: Secondary | ICD-10-CM | POA: Insufficient documentation

## 2021-03-19 DIAGNOSIS — E032 Hypothyroidism due to medicaments and other exogenous substances: Secondary | ICD-10-CM

## 2021-03-19 DIAGNOSIS — R21 Rash and other nonspecific skin eruption: Secondary | ICD-10-CM | POA: Insufficient documentation

## 2021-03-19 DIAGNOSIS — B1920 Unspecified viral hepatitis C without hepatic coma: Secondary | ICD-10-CM | POA: Insufficient documentation

## 2021-03-19 LAB — CBC WITH DIFFERENTIAL (CANCER CENTER ONLY)
Abs Immature Granulocytes: 0 10*3/uL (ref 0.00–0.07)
Basophils Absolute: 0 10*3/uL (ref 0.0–0.1)
Basophils Relative: 1 %
Eosinophils Absolute: 0.1 10*3/uL (ref 0.0–0.5)
Eosinophils Relative: 2 %
HCT: 36.9 % (ref 36.0–46.0)
Hemoglobin: 12.4 g/dL (ref 12.0–15.0)
Immature Granulocytes: 0 %
Lymphocytes Relative: 39 %
Lymphs Abs: 1.6 10*3/uL (ref 0.7–4.0)
MCH: 31.2 pg (ref 26.0–34.0)
MCHC: 33.6 g/dL (ref 30.0–36.0)
MCV: 92.7 fL (ref 80.0–100.0)
Monocytes Absolute: 0.3 10*3/uL (ref 0.1–1.0)
Monocytes Relative: 8 %
Neutro Abs: 2.1 10*3/uL (ref 1.7–7.7)
Neutrophils Relative %: 50 %
Platelet Count: 150 10*3/uL (ref 150–400)
RBC: 3.98 MIL/uL (ref 3.87–5.11)
RDW: 14.9 % (ref 11.5–15.5)
WBC Count: 4 10*3/uL (ref 4.0–10.5)
nRBC: 0 % (ref 0.0–0.2)

## 2021-03-19 LAB — CMP (CANCER CENTER ONLY)
ALT: 118 U/L — ABNORMAL HIGH (ref 0–44)
AST: 103 U/L — ABNORMAL HIGH (ref 15–41)
Albumin: 3.8 g/dL (ref 3.5–5.0)
Alkaline Phosphatase: 221 U/L — ABNORMAL HIGH (ref 38–126)
Anion gap: 5 (ref 5–15)
BUN: 13 mg/dL (ref 8–23)
CO2: 26 mmol/L (ref 22–32)
Calcium: 10.7 mg/dL — ABNORMAL HIGH (ref 8.9–10.3)
Chloride: 102 mmol/L (ref 98–111)
Creatinine: 0.6 mg/dL (ref 0.44–1.00)
GFR, Estimated: >60 mL/min (ref >60)
Glucose, Bld: 166 mg/dL — ABNORMAL HIGH (ref 70–99)
Potassium: 3.7 mmol/L (ref 3.5–5.1)
Sodium: 133 mmol/L — ABNORMAL LOW (ref 135–145)
Total Bilirubin: 1.1 mg/dL (ref 0.3–1.2)
Total Protein: 7.6 g/dL (ref 6.5–8.1)

## 2021-03-19 MED ORDER — HEPARIN SOD (PORK) LOCK FLUSH 100 UNIT/ML IV SOLN
500.0000 [IU] | Freq: Once | INTRAVENOUS | Status: AC
  Administered 2021-03-19: 500 [IU] via INTRAVENOUS

## 2021-03-19 MED ORDER — SODIUM CHLORIDE 0.9 % IV SOLN
240.0000 mg | Freq: Once | INTRAVENOUS | Status: AC
  Administered 2021-03-19: 240 mg via INTRAVENOUS
  Filled 2021-03-19: qty 24

## 2021-03-19 MED ORDER — SODIUM CHLORIDE 0.9% FLUSH
10.0000 mL | Freq: Once | INTRAVENOUS | Status: AC
  Administered 2021-03-19: 10 mL via INTRAVENOUS

## 2021-03-19 MED ORDER — SODIUM CHLORIDE 0.9% FLUSH: 10.0000 mL | INTRAVENOUS | Status: DC | PRN

## 2021-03-19 MED ORDER — HEPARIN SOD (PORK) LOCK FLUSH 100 UNIT/ML IV SOLN: 500.0000 [IU] | Freq: Once | INTRAVENOUS | Status: DC | PRN

## 2021-03-19 MED ORDER — SODIUM CHLORIDE 0.9 % IV SOLN: Freq: Once | INTRAVENOUS | Status: AC

## 2021-03-19 MED ORDER — ALTEPLASE 2 MG IJ SOLR: 2.0000 mg | Freq: Once | INTRAMUSCULAR | Status: DC | PRN

## 2021-03-19 MED ORDER — SODIUM CHLORIDE 0.9% FLUSH
10.0000 mL | INTRAVENOUS | Status: DC | PRN
  Administered 2021-03-19: 10 mL via INTRAVENOUS

## 2021-03-19 NOTE — Patient Instructions (Signed)
[facility]  Discharge Instructions: Thank you for choosing [facility] to provide your oncology and hematology care.   If you have a lab appointment with the Cancer Center, please go directly to the Cancer Center and check in at the registration area.  Wear comfortable clothing and clothing appropriate for easy access to any Portacath or PICC line.   We strive to give you quality time with your provider. You may need to reschedule your appointment if you arrive late (15 or more minutes).  Arriving late affects you and other patients whose appointments are after yours.  Also, if you miss three or more appointments without notifying the office, you may be dismissed from the clinic at the provider's discretion.      For prescription refill requests, have your pharmacy contact our office and allow 72 hours for refills to be completed.    Today you received the following chemotherapy and/or immunotherapy agents:  Opdivo      To help prevent nausea and vomiting after your treatment, we encourage you to take your nausea medication as directed.  BELOW ARE SYMPTOMS THAT SHOULD BE REPORTED IMMEDIATELY: *FEVER GREATER THAN 100.4 F (38 C) OR HIGHER *CHILLS OR SWEATING *NAUSEA AND VOMITING THAT IS NOT CONTROLLED WITH YOUR NAUSEA MEDICATION *UNUSUAL SHORTNESS OF BREATH *UNUSUAL BRUISING OR BLEEDING *URINARY PROBLEMS (pain or burning when urinating, or frequent urination) *BOWEL PROBLEMS (unusual diarrhea, constipation, pain near the anus) TENDERNESS IN MOUTH AND THROAT WITH OR WITHOUT PRESENCE OF ULCERS (sore throat, sores in mouth, or a toothache) UNUSUAL RASH, SWELLING OR PAIN  UNUSUAL VAGINAL DISCHARGE OR ITCHING   Items with * indicate a potential emergency and should be followed up as soon as possible or go to the Emergency Department if any problems should occur.  Please show the CHEMOTHERAPY ALERT CARD or IMMUNOTHERAPY ALERT CARD at check-in to the  Emergency Department and triage nurse. Should you have questions after your visit or need to cancel or reschedule your appointment, please contact [facility]  [phone] and follow the prompts.  Office hours are 8:00 a.m. to 4:30 p.m. Monday - Friday. Please note that voicemails left after 4:00 p.m. may not be returned until the following business day.  We are closed weekends and major holidays. You have access to a nurse at all times for urgent questions. Please call the main number to the clinic [phone] and follow the prompts.  For any non-urgent questions, you may also contact your provider using MyChart. We now offer e-Visits for anyone 18 and older to request care online for non-urgent symptoms. For details visit [email or web address].   Also download the MyChart app! Go to the app store, search "MyChart", open the app, select [place], and log in with your MyChart username and password.  Due to Covid, a mask is required upon entering the hospital/clinic. If you do not have a mask, one will be given to you upon arrival. For doctor visits, patients may have 1 support person aged 18 or older with them. For treatment visits, patients cannot have anyone with them due to current Covid guidelines and our immunocompromised population.  

## 2021-03-19 NOTE — Progress Notes (Signed)
OK to treat with today's AST and ALT values per Dr. Marin Olp.

## 2021-03-19 NOTE — Progress Notes (Signed)
Hematology and Oncology Follow Up Visit  [name] [name] [id number] 18-Oct-1945 76 y.o. 03/19/2021   Principle Diagnosis:  Hepatocellular carcinoma-multifocal Hepatitis C   Current Therapy:        Status post cycle 1 of nivolumab/ipilimumab -- d/c on 10/13/2020 due to hepatic toxicity Nivolumab 240 mg IV every 2 weeks - s/p cycle #7 -- started 11/21/2020   Interim History:  [name] is here today for follow-up and treatment.  As always, she is doing pretty well.  I am just incredibly impressed as to how well she has responded to treatment.  She had MRI done about a month ago which showed a very nice response to immunotherapy with her hepatocellular carcinoma.  She is tolerating the nivolumab well.  She has chronically elevated LFTs.  This might be from the Hepatitis C that she has.  At some point, this might need to be treated.  She has had no problems with nausea or vomiting.  She has had no diarrhea.  She has had no fever.  There is been no cough.  She has had no bleeding.  Her last alpha-fetoprotein was 14.8.  She has had no leg swelling.  She does have diabetes.  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  Overall, I would say her performance status is probably ECOG 1.    The Medications:  Allergies as of 03/19/2021       Reactions   Amoxicillin Swelling, Rash   Daughter is unsure if patient is allergic to Amoxicillin or Clarithromycin.   Bactrim [sulfamethoxazole-trimethoprim] Swelling   Clarithromycin Swelling, Rash   Daughter is unsure if Clarithromycin or Amoxicillin caused rash and swelling.        Medication List        Accurate as of March 19, 2021  1:36 PM. If you have any questions, ask your nurse or doctor.          brimonidine 0.2 % ophthalmic solution Commonly known as: ALPHAGAN 1 drop 2 (two) times daily.   losartan 50 MG tablet Commonly known as: COZAAR Take 1 tablet (50 mg total) by mouth at bedtime.    NIFEdipine 30 MG 24 hr tablet Commonly known as: PROCARDIA-XL/NIFEDICAL-XL Take 1 tablet (30 mg total) by mouth in the morning and at bedtime.   NovoLIN 70/30 (70-30) 100 UNIT/ML injection Generic drug: insulin NPH-regular Human Inject 20 units into the skin every morning and 5 units every afternoon with meals   pantoprazole 40 MG tablet Commonly known as: Protonix Take 1 tablet (40 mg total) by mouth 2 (two) times daily.   True Metrix Blood Glucose Test test strip Generic drug: glucose blood Use as instructed        Allergies:  Allergies  Allergen Reactions   Amoxicillin Swelling and Rash    Daughter is unsure if patient is allergic to Amoxicillin or Clarithromycin.   Bactrim [Sulfamethoxazole-Trimethoprim] Swelling   Clarithromycin Swelling and Rash    Daughter is unsure if Clarithromycin or Amoxicillin caused rash and swelling.    Past Medical History, Surgical history, Social history, and Family History were reviewed and updated.  Review of Systems: Review of Systems  Constitutional: Negative.   HENT: Negative.    Eyes: Negative.   Respiratory: Negative.    Cardiovascular: Negative.   Gastrointestinal: Negative.   Genitourinary: Negative.   Musculoskeletal: Negative.   Skin: Negative.   Neurological:  Positive for tingling.  Endo/Heme/Allergies: Negative.   Psychiatric/Behavioral: Negative.      Physical  Exam:  height is 5\' 4"  (1.626 m) and weight is 146 lb (66.2 kg). Her oral temperature is 97.5 F (36.4 C) (abnormal). Her blood pressure is 128/70 and her pulse is 84. Her respiration is 18 and oxygen saturation is 100%.   Wt Readings from Last 3 Encounters:  03/19/21 146 lb (66.2 kg)  03/17/21 146 lb 4 oz (66.3 kg)  03/04/21 142 lb (64.4 kg)    Physical Exam Vitals reviewed.  HENT:     Head: Normocephalic and atraumatic.  Eyes:     Pupils: Pupils are equal, round, and reactive to light.  Cardiovascular:     Rate and Rhythm: Normal rate and  regular rhythm.     Heart sounds: Normal heart sounds.  Pulmonary:     Effort: Pulmonary effort is normal.     Breath sounds: Normal breath sounds.  Abdominal:     General: Bowel sounds are normal.     Palpations: Abdomen is soft.     Comments: Abdominal exam shows a well-healed laparotomy scar.  She has no fluid wave.  There is no guarding or rebound tenderness to palpation.  There is no palpable liver or spleen tip.  Musculoskeletal:        General: No tenderness or deformity. Normal range of motion.     Cervical back: Normal range of motion.  Lymphadenopathy:     Cervical: No cervical adenopathy.  Skin:    General: Skin is warm and dry.     Findings: No erythema or rash.  Neurological:     Mental Status: She is alert and oriented to person, place, and time.  Psychiatric:        Behavior: Behavior normal.        Thought Content: Thought content normal.        Judgment: Judgment normal.      Lab Results  Component Value Date   WBC 4.0 03/19/2021   HGB 12.4 03/19/2021   HCT 36.9 03/19/2021   MCV 92.7 03/19/2021   PLT 150 03/19/2021   Lab Results  Component Value Date   FERRITIN 603 (H) 10/02/2020   IRON 31 (L) 10/02/2020   TIBC 241 10/02/2020   UIBC 211 10/02/2020   IRONPCTSAT 13 (L) 10/02/2020   Lab Results  Component Value Date   RBC 3.98 03/19/2021   No results found for: KPAFRELGTCHN, LAMBDASER, KAPLAMBRATIO No results found for: IGGSERUM, IGA, IGMSERUM No results found for: Odetta Pink, SPEI   Chemistry      Component Value Date/Time   NA 139 03/04/2021 0943   K 4.0 03/04/2021 0943   CL 107 03/04/2021 0943   CO2 27 03/04/2021 0943   BUN 16 03/04/2021 0943   CREATININE 0.62 03/04/2021 0943      Component Value Date/Time   CALCIUM 10.7 (H) 03/04/2021 0943   ALKPHOS 190 (H) 03/04/2021 0943   AST 109 (H) 03/04/2021 0943   ALT 120 (H) 03/04/2021 0943   BILITOT 1.0 03/04/2021 0943        Impression and Plan: [name] is a very pleasant 76 yo female from [place] with hepatocellular carcinoma and Hepatitis C.   Again, she is responded very nicely to treatment.  I am just very happy for her.    I told her daughter that we could certainly move her appointments out to once a month.  She is done well..      They are thinking about going to [place].  They  are not sure when this will happen.  I told them that it would not be a problem from my point of view.  Again, I am not sure about when the Hepatitis C can be treated.  I am just happy that she is responding has a good quality of life.  I do not think we have to do another scan probably until March..   .  Follow-up in 2 weeks with MD.   Volanda Napoleon, MD 2/2/20231:36 PM

## 2021-03-20 LAB — TSH: TSH: 2.121 u[IU]/mL (ref 0.308–3.960)

## 2021-03-20 LAB — AFP TUMOR MARKER: AFP, Serum, Tumor Marker: 15.1 ng/mL — ABNORMAL HIGH (ref 0.0–9.2)

## 2021-03-22 ENCOUNTER — Encounter: Payer: Self-pay | Admitting: Gastroenterology

## 2021-03-24 ENCOUNTER — Other Ambulatory Visit (INDEPENDENT_AMBULATORY_CARE_PROVIDER_SITE_OTHER): Payer: Self-pay | Admitting: Family Medicine

## 2021-03-24 ENCOUNTER — Other Ambulatory Visit: Payer: Self-pay | Admitting: Internal Medicine

## 2021-03-25 ENCOUNTER — Encounter: Payer: Self-pay | Admitting: Hematology & Oncology

## 2021-03-25 ENCOUNTER — Other Ambulatory Visit: Payer: Self-pay

## 2021-03-25 MED ORDER — NIFEDIPINE ER OSMOTIC RELEASE 30 MG PO TB24
30.0000 mg | ORAL_TABLET | Freq: Two times a day (BID) | ORAL | 0 refills | Status: DC
  Filled 2021-03-25 – 2021-04-15 (×2): qty 60, 30d supply, fill #0

## 2021-03-25 NOTE — Telephone Encounter (Signed)
Requested Prescriptions  Pending Prescriptions Disp Refills   NIFEdipine (PROCARDIA-XL/NIFEDICAL-XL) 30 MG 24 hr tablet 60 tablet 0    Sig: Take 1 tablet (30 mg total) by mouth in the morning and at bedtime.     Cardiovascular: Calcium Channel Blockers 2 Passed - 03/24/2021  5:53 PM      Passed - Last BP in normal range    BP Readings from Last 1 Encounters:  03/19/21 128/63         Passed - Last Heart Rate in normal range    Pulse Readings from Last 1 Encounters:  03/19/21 79         Passed - Valid encounter within last 6 months    Recent Outpatient Visits          1 month ago Need for zoster vaccination   Taylor, Jarome Matin, RPH-CPP   3 months ago Type 2 diabetes mellitus with peripheral neuropathy [facility])   [place], MD   6 months ago Controlled type 2 diabetes mellitus without complication, with long-term current use of insulin [facility])   [place], MD   6 months ago Primary hypertension   [place] Kerin Perna, NP

## 2021-04-01 ENCOUNTER — Encounter: Payer: Self-pay | Admitting: Hematology & Oncology

## 2021-04-01 ENCOUNTER — Inpatient Hospital Stay (HOSPITAL_BASED_OUTPATIENT_CLINIC_OR_DEPARTMENT_OTHER): Payer: Self-pay | Admitting: Hematology & Oncology

## 2021-04-01 ENCOUNTER — Inpatient Hospital Stay: Payer: No Typology Code available for payment source

## 2021-04-01 ENCOUNTER — Other Ambulatory Visit: Payer: Self-pay

## 2021-04-01 VITALS — BP 138/52 | HR 72 | Temp 98.7°F | Resp 17 | Wt 147.8 lb

## 2021-04-01 DIAGNOSIS — C220 Liver cell carcinoma: Secondary | ICD-10-CM

## 2021-04-01 LAB — CMP (CANCER CENTER ONLY)
ALT: 121 U/L — ABNORMAL HIGH (ref 0–44)
AST: 114 U/L — ABNORMAL HIGH (ref 15–41)
Albumin: 3.3 g/dL — ABNORMAL LOW (ref 3.5–5.0)
Alkaline Phosphatase: 178 U/L — ABNORMAL HIGH (ref 38–126)
Anion gap: 6 (ref 5–15)
BUN: 15 mg/dL (ref 8–23)
CO2: 23 mmol/L (ref 22–32)
Calcium: 9.6 mg/dL (ref 8.9–10.3)
Chloride: 104 mmol/L (ref 98–111)
Creatinine: 0.58 mg/dL (ref 0.44–1.00)
GFR, Estimated: >60 mL/min (ref >60)
Glucose, Bld: 230 mg/dL — ABNORMAL HIGH (ref 70–99)
Potassium: 3.9 mmol/L (ref 3.5–5.1)
Sodium: 133 mmol/L — ABNORMAL LOW (ref 135–145)
Total Bilirubin: 1.3 mg/dL — ABNORMAL HIGH (ref 0.3–1.2)
Total Protein: 7.4 g/dL (ref 6.5–8.1)

## 2021-04-01 LAB — CBC WITH DIFFERENTIAL (CANCER CENTER ONLY)
Abs Immature Granulocytes: 0.01 10*3/uL (ref 0.00–0.07)
Basophils Absolute: 0 10*3/uL (ref 0.0–0.1)
Basophils Relative: 1 %
Eosinophils Absolute: 0.1 10*3/uL (ref 0.0–0.5)
Eosinophils Relative: 2 %
HCT: 36.4 % (ref 36.0–46.0)
Hemoglobin: 12.3 g/dL (ref 12.0–15.0)
Immature Granulocytes: 0 %
Lymphocytes Relative: 42 %
Lymphs Abs: 1.6 10*3/uL (ref 0.7–4.0)
MCH: 31.1 pg (ref 26.0–34.0)
MCHC: 33.8 g/dL (ref 30.0–36.0)
MCV: 91.9 fL (ref 80.0–100.0)
Monocytes Absolute: 0.3 10*3/uL (ref 0.1–1.0)
Monocytes Relative: 7 %
Neutro Abs: 1.8 10*3/uL (ref 1.7–7.7)
Neutrophils Relative %: 48 %
Platelet Count: 143 10*3/uL — ABNORMAL LOW (ref 150–400)
RBC: 3.96 MIL/uL (ref 3.87–5.11)
RDW: 14.8 % (ref 11.5–15.5)
WBC Count: 3.8 10*3/uL — ABNORMAL LOW (ref 4.0–10.5)
nRBC: 0 % (ref 0.0–0.2)

## 2021-04-01 LAB — LACTATE DEHYDROGENASE: LDH: 181 U/L (ref 98–192)

## 2021-04-01 MED ORDER — HEPARIN SOD (PORK) LOCK FLUSH 100 UNIT/ML IV SOLN
500.0000 [IU] | Freq: Once | INTRAVENOUS | Status: AC | PRN
  Administered 2021-04-01: 500 [IU]

## 2021-04-01 MED ORDER — SODIUM CHLORIDE 0.9% FLUSH
10.0000 mL | INTRAVENOUS | Status: DC | PRN
  Administered 2021-04-01: 10 mL

## 2021-04-01 MED ORDER — SODIUM CHLORIDE 0.9 % IV SOLN: Freq: Once | INTRAVENOUS | Status: AC

## 2021-04-01 MED ORDER — SODIUM CHLORIDE 0.9 % IV SOLN
240.0000 mg | Freq: Once | INTRAVENOUS | Status: AC
  Administered 2021-04-01: 240 mg via INTRAVENOUS
  Filled 2021-04-01: qty 24

## 2021-04-01 NOTE — Progress Notes (Signed)
Hematology and Oncology Follow Up Visit  [name] [name] [id number] 03-20-1945 76 y.o. 04/01/2021   Principle Diagnosis:  Hepatocellular carcinoma-multifocal Hepatitis C   Current Therapy:        Status post cycle 1 of nivolumab/ipilimumab -- d/c on 10/13/2020 due to hepatic toxicity Nivolumab 240 mg IV every 2 weeks - s/p cycle #7 -- started 11/21/2020   Interim History:  [name] [name] is here today for follow-up and treatment.  She is doing pretty well.  She has this macular rash on her lower legs and left shoulder.  I suppose this might be from the nivolumab.  The rash is not raised at all.  Is not pruritic.  There is no papules or macules noted.  Her last alpha-fetoprotein a was holding steady at 15.  There is been no problems with bleeding.  She had no abdominal pain.  I think she sees gastroenterology in early March and has another upper endoscopy.  She has had no cough or shortness of breath.  There is been no fever.  She has had no headache.  Currently, I would say performance status is probably ECOG 1.    Medications:  Allergies as of 04/01/2021       Reactions   Amoxicillin Swelling, Rash   Daughter is unsure if patient is allergic to Amoxicillin or Clarithromycin.   Bactrim [sulfamethoxazole-trimethoprim] Swelling   Clarithromycin Swelling, Rash   Daughter is unsure if Clarithromycin or Amoxicillin caused rash and swelling.        Medication List        Accurate as of April 01, 2021  1:56 PM. If you have any questions, ask your nurse or doctor.          brimonidine 0.2 % ophthalmic solution Commonly known as: ALPHAGAN 1 drop 2 (two) times daily.   losartan 50 MG tablet Commonly known as: COZAAR Take 1 tablet (50 mg total) by mouth at bedtime.   NIFEdipine 30 MG 24 hr tablet Commonly known as: PROCARDIA-XL/NIFEDICAL-XL Take 1 tablet (30 mg total) by mouth in the morning and at bedtime.   NovoLIN 70/30 (70-30) 100 UNIT/ML  injection Generic drug: insulin NPH-regular Human Inject 20 units into the skin every morning and 5 units every afternoon with meals   pantoprazole 40 MG tablet Commonly known as: Protonix Take 1 tablet (40 mg total) by mouth 2 (two) times daily.   True Metrix Blood Glucose Test test strip Generic drug: glucose blood Use as instructed        Allergies:  Allergies  Allergen Reactions   Amoxicillin Swelling and Rash    Daughter is unsure if patient is allergic to Amoxicillin or Clarithromycin.   Bactrim [Sulfamethoxazole-Trimethoprim] Swelling   Clarithromycin Swelling and Rash    Daughter is unsure if Clarithromycin or Amoxicillin caused rash and swelling.    Past Medical History, Surgical history, Social history, and Family History were reviewed and updated.  Review of Systems: Review of Systems  Constitutional: Negative.   HENT: Negative.    Eyes: Negative.   Respiratory: Negative.    Cardiovascular: Negative.   Gastrointestinal: Negative.   Genitourinary: Negative.   Musculoskeletal: Negative.   Skin: Negative.   Neurological:  Positive for tingling.  Endo/Heme/Allergies: Negative.   Psychiatric/Behavioral: Negative.      Physical Exam:  weight is 147 lb 12.8 oz (67 kg). Her oral temperature is 98.7 F (37.1 C). Her blood pressure is 138/52 (abnormal) and her pulse is 72. Her respiration is 17 and oxygen  saturation is 100%.   Wt Readings from Last 3 Encounters:  04/01/21 147 lb 12.8 oz (67 kg)  03/19/21 146 lb (66.2 kg)  03/17/21 146 lb 4 oz (66.3 kg)    Physical Exam Vitals reviewed.  HENT:     Head: Normocephalic and atraumatic.  Eyes:     Pupils: Pupils are equal, round, and reactive to light.  Cardiovascular:     Rate and Rhythm: Normal rate and regular rhythm.     Heart sounds: Normal heart sounds.  Pulmonary:     Effort: Pulmonary effort is normal.     Breath sounds: Normal breath sounds.  Abdominal:     General: Bowel sounds are normal.      Palpations: Abdomen is soft.     Comments: Abdominal exam shows a well-healed laparotomy scar.  She has no fluid wave.  There is no guarding or rebound tenderness to palpation.  There is no palpable liver or spleen tip.  Musculoskeletal:        General: No tenderness or deformity. Normal range of motion.     Cervical back: Normal range of motion.  Lymphadenopathy:     Cervical: No cervical adenopathy.  Skin:    General: Skin is warm and dry.     Findings: No erythema or rash.  Neurological:     Mental Status: She is alert and oriented to person, place, and time.  Psychiatric:        Behavior: Behavior normal.        Thought Content: Thought content normal.        Judgment: Judgment normal.      Lab Results  Component Value Date   WBC 3.8 (L) 04/01/2021   HGB 12.3 04/01/2021   HCT 36.4 04/01/2021   MCV 91.9 04/01/2021   PLT 143 (L) 04/01/2021   Lab Results  Component Value Date   FERRITIN 603 (H) 10/02/2020   IRON 31 (L) 10/02/2020   TIBC 241 10/02/2020   UIBC 211 10/02/2020   IRONPCTSAT 13 (L) 10/02/2020   Lab Results  Component Value Date   RBC 3.96 04/01/2021   No results found for: KPAFRELGTCHN, LAMBDASER, KAPLAMBRATIO No results found for: IGGSERUM, IGA, IGMSERUM No results found for: TOTALPROTELP, ALBUMINELP, A1GS, A2GS, BETS, BETA2SER, GAMS, MSPIKE, SPEI   Chemistry      Component Value Date/Time   NA 133 (L) 04/01/2021 1310   K 3.9 04/01/2021 1310   CL 104 04/01/2021 1310   CO2 23 04/01/2021 1310   BUN 15 04/01/2021 1310   CREATININE 0.58 04/01/2021 1310      Component Value Date/Time   CALCIUM 9.6 04/01/2021 1310   ALKPHOS 178 (H) 04/01/2021 1310   AST 114 (H) 04/01/2021 1310   ALT 121 (H) 04/01/2021 1310   BILITOT 1.3 (H) 04/01/2021 1310       Impression and Plan: [name] [name] is a very pleasant 76 yo female from [place] with hepatocellular carcinoma and Hepatitis C.   Again, she is responded very nicely to treatment.  I am just  very happy for her.  Her last MRI was done early January.  I do not think we need another 1 probably until April.  At some point, I probably will need to check her Hepatitis C levels.  Again I would think that treating her for the Hepatitis C would not be a bad idea.  However, I guess infectious diseases has other criteria that they utilize.  We will go ahead with her treatment.  We will then plan to get her back in another 2 weeks.  As for the rash, I would just watch it for right now.Volanda Napoleon, MD 2/15/20231:56 PM

## 2021-04-01 NOTE — Patient Instructions (Signed)
Nivolumab injection °What is this medication? °NIVOLUMAB (nye VOL ue mab) is a monoclonal antibody. It treats certain types of cancer. Some of the cancers treated are colon cancer, head and neck cancer, Hodgkin lymphoma, lung cancer, and melanoma. °This medicine may be used for other purposes; ask your health care provider or pharmacist if you have questions. °COMMON BRAND NAME(S): Opdivo °What should I tell my care team before I take this medication? °They need to know if you have any of these conditions: °Autoimmune diseases such as Crohn's disease, ulcerative colitis, or lupus °Have had or planning to have an allogeneic stem cell transplant (uses someone else's stem cells) °History of chest radiation °Organ transplant °Nervous system problems such as myasthenia gravis or Guillain-Barre syndrome °An unusual or allergic reaction to nivolumab, other medicines, foods, dyes, or preservatives °Pregnant or trying to get pregnant °Breast-feeding °How should I use this medication? °This medication is injected into a vein. It is given in a hospital or clinic setting. °A special MedGuide will be given to you before each treatment. Be sure to read this information carefully each time. °Talk to your care team regarding the use of this medication in children. While it may be prescribed for children as young as 12 years for selected conditions, precautions do apply. °Overdosage: If you think you have taken too much of this medicine contact a poison control center or emergency room at once. °NOTE: This medicine is only for you. Do not share this medicine with others. °What if I miss a dose? °Keep appointments for follow-up doses. It is important not to miss your dose. Call your care team if you are unable to keep an appointment. °What may interact with this medication? °Interactions have not been studied. °This list may not describe all possible interactions. Give your health care provider a list of all the medicines, herbs,  non-prescription drugs, or dietary supplements you use. Also tell them if you smoke, drink alcohol, or use illegal drugs. Some items may interact with your medicine. °What should I watch for while using this medication? °Your condition will be monitored carefully while you are receiving this medication. °You may need blood work done while you are taking this medication. °Do not become pregnant while taking this medication or for 5 months after stopping it. Women should inform their care team if they wish to become pregnant or think they might be pregnant. There is a potential for serious harm to an unborn child. Talk to your care team for more information. Do not breast-feed an infant while taking this medication or for 5 months after stopping it. °What side effects may I notice from receiving this medication? °Side effects that you should report to your care team as soon as possible: °Allergic reactions--skin rash, itching, hives, swelling of the face, lips, tongue, or throat °Bloody or black, tar-like stools °Change in vision °Chest pain °Diarrhea °Dry cough, shortness of breath or trouble breathing °Eye pain °Fast or irregular heartbeat °Fever, chills °High blood sugar (hyperglycemia)--increased thirst or amount of urine, unusual weakness or fatigue, blurry vision °High thyroid levels (hyperthyroidism)--fast or irregular heartbeat, weight loss, excessive sweating or sensitivity to heat, tremors or shaking, anxiety, nervousness, irregular menstrual cycle or spotting °Kidney injury--decrease in the amount of urine, swelling of the ankles, hands, or feet °Liver injury--right upper belly pain, loss of appetite, nausea, light-colored stool, dark yellow or brown urine, yellowing skin or eyes, unusual weakness or fatigue °Low red blood cell count--unusual weakness or fatigue, dizziness, headache, trouble breathing °  Low thyroid levels (hypothyroidism)--unusual weakness or fatigue, increased sensitivity to cold,  constipation, hair loss, dry skin, weight gain, feelings of depression °Mood and behavior changes-confusion, change in sex drive or performance, irritability °Muscle pain or cramps °Pain, tingling, or numbness in the hands or feet, muscle weakness, trouble walking, loss of balance or coordination °Red or dark brown urine °Redness, blistering, peeling, or loosening of the skin, including inside the mouth °Stomach pain °Unusual bruising or bleeding °Side effects that usually do not require medical attention (report to your care team if they continue or are bothersome): °Bone pain °Constipation °Loss of appetite °Nausea °Tiredness °Vomiting °This list may not describe all possible side effects. Call your doctor for medical advice about side effects. You may report side effects to FDA at 1-800-FDA-1088. °Where should I keep my medication? °This medication is given in a hospital or clinic and will not be stored at home. °NOTE: This sheet is a summary. It may not cover all possible information. If you have questions about this medicine, talk to your doctor, pharmacist, or health care provider. °© 2022 [employer] (2020-10-21 00:00:00) ° °

## 2021-04-01 NOTE — Patient Instructions (Signed)

## 2021-04-02 LAB — AFP TUMOR MARKER: AFP, Serum, Tumor Marker: 14.6 ng/mL — ABNORMAL HIGH (ref 0.0–9.2)

## 2021-04-14 ENCOUNTER — Inpatient Hospital Stay: Payer: Self-pay

## 2021-04-14 ENCOUNTER — Inpatient Hospital Stay: Payer: No Typology Code available for payment source

## 2021-04-14 ENCOUNTER — Inpatient Hospital Stay (HOSPITAL_BASED_OUTPATIENT_CLINIC_OR_DEPARTMENT_OTHER): Payer: No Typology Code available for payment source | Admitting: Hematology & Oncology

## 2021-04-14 ENCOUNTER — Encounter: Payer: Self-pay | Admitting: Hematology & Oncology

## 2021-04-14 ENCOUNTER — Other Ambulatory Visit: Payer: Self-pay

## 2021-04-14 VITALS — BP 141/70 | HR 83 | Temp 98.9°F | Resp 18 | Ht 64.0 in | Wt 145.0 lb

## 2021-04-14 DIAGNOSIS — B182 Chronic viral hepatitis C: Secondary | ICD-10-CM

## 2021-04-14 DIAGNOSIS — C220 Liver cell carcinoma: Secondary | ICD-10-CM

## 2021-04-14 LAB — CMP (CANCER CENTER ONLY)
ALT: 110 U/L — ABNORMAL HIGH (ref 0–44)
AST: 105 U/L — ABNORMAL HIGH (ref 15–41)
Albumin: 3.4 g/dL — ABNORMAL LOW (ref 3.5–5.0)
Alkaline Phosphatase: 191 U/L — ABNORMAL HIGH (ref 38–126)
Anion gap: 6 (ref 5–15)
BUN: 15 mg/dL (ref 8–23)
CO2: 25 mmol/L (ref 22–32)
Calcium: 9.7 mg/dL (ref 8.9–10.3)
Chloride: 104 mmol/L (ref 98–111)
Creatinine: 0.62 mg/dL (ref 0.44–1.00)
GFR, Estimated: >60 mL/min (ref >60)
Glucose, Bld: 112 mg/dL — ABNORMAL HIGH (ref 70–99)
Potassium: 3.9 mmol/L (ref 3.5–5.1)
Sodium: 135 mmol/L (ref 135–145)
Total Bilirubin: 1 mg/dL (ref 0.3–1.2)
Total Protein: 7.2 g/dL (ref 6.5–8.1)

## 2021-04-14 LAB — CBC WITH DIFFERENTIAL (CANCER CENTER ONLY)
Abs Immature Granulocytes: 0 10*3/uL (ref 0.00–0.07)
Basophils Absolute: 0 10*3/uL (ref 0.0–0.1)
Basophils Relative: 1 %
Eosinophils Absolute: 0.1 10*3/uL (ref 0.0–0.5)
Eosinophils Relative: 1 %
HCT: 36.3 % (ref 36.0–46.0)
Hemoglobin: 12.3 g/dL (ref 12.0–15.0)
Immature Granulocytes: 0 %
Lymphocytes Relative: 41 %
Lymphs Abs: 1.7 10*3/uL (ref 0.7–4.0)
MCH: 31.1 pg (ref 26.0–34.0)
MCHC: 33.9 g/dL (ref 30.0–36.0)
MCV: 91.9 fL (ref 80.0–100.0)
Monocytes Absolute: 0.3 10*3/uL (ref 0.1–1.0)
Monocytes Relative: 8 %
Neutro Abs: 2 10*3/uL (ref 1.7–7.7)
Neutrophils Relative %: 49 %
Platelet Count: 156 10*3/uL (ref 150–400)
RBC: 3.95 MIL/uL (ref 3.87–5.11)
RDW: 14.8 % (ref 11.5–15.5)
WBC Count: 4.1 10*3/uL (ref 4.0–10.5)
nRBC: 0 % (ref 0.0–0.2)

## 2021-04-14 LAB — LACTATE DEHYDROGENASE: LDH: 173 U/L (ref 98–192)

## 2021-04-14 LAB — HEPATITIS C ANTIBODY: HCV Ab: REACTIVE — AB

## 2021-04-14 MED ORDER — SODIUM CHLORIDE 0.9 % IV SOLN: Freq: Once | INTRAVENOUS | Status: AC

## 2021-04-14 MED ORDER — HEPARIN SOD (PORK) LOCK FLUSH 100 UNIT/ML IV SOLN
500.0000 [IU] | Freq: Once | INTRAVENOUS | Status: AC | PRN
  Administered 2021-04-14: 500 [IU]

## 2021-04-14 MED ORDER — SODIUM CHLORIDE 0.9 % IV SOLN
240.0000 mg | Freq: Once | INTRAVENOUS | Status: AC
  Administered 2021-04-14: 240 mg via INTRAVENOUS
  Filled 2021-04-14: qty 24

## 2021-04-14 MED ORDER — SODIUM CHLORIDE 0.9% FLUSH
10.0000 mL | INTRAVENOUS | Status: DC | PRN
  Administered 2021-04-14: 10 mL

## 2021-04-14 NOTE — Progress Notes (Signed)
Hematology and Oncology Follow Up Visit  [name] [name] [id number] 1945/03/17 76 y.o. 04/14/2021   Principle Diagnosis:  Hepatocellular carcinoma-multifocal Hepatitis C   Current Therapy:        Status post cycle 1 of nivolumab/ipilimumab -- d/c on 10/13/2020 due to hepatic toxicity Nivolumab 240 mg IV every 2 weeks - s/p cycle #7 -- started 11/21/2020   Interim History:  [name] [name] is here today for follow-up and treatment.  As always, she looks amazingly well.  She is tolerated the nivolumab nicely.  Her liver tests are somewhat elevated but are stable.  She has had no problems with abdominal pain.  I think she will have a upper endoscopy and colonoscopy in early March.  She has had no problems with fever.  There is no cough or shortness of breath.  She has had no rashes.  She has had no leg swelling.  She has had no change in bowel or bladder habits.  Her last alpha-fetoprotein was 14.6.  Overall, I would say performance status is ECOG 0.     Medications:  Allergies as of 04/14/2021       Reactions   Amoxicillin Swelling, Rash   Daughter is unsure if patient is allergic to Amoxicillin or Clarithromycin.   Bactrim [sulfamethoxazole-trimethoprim] Swelling   Clarithromycin Swelling, Rash   Daughter is unsure if Clarithromycin or Amoxicillin caused rash and swelling.        Medication List        Accurate as of April 14, 2021 12:20 PM. If you have any questions, ask your nurse or doctor.          brimonidine 0.2 % ophthalmic solution Commonly known as: ALPHAGAN 1 drop 2 (two) times daily.   losartan 50 MG tablet Commonly known as: COZAAR Take 1 tablet (50 mg total) by mouth at bedtime.   NIFEdipine 30 MG 24 hr tablet Commonly known as: PROCARDIA-XL/NIFEDICAL-XL Take 1 tablet (30 mg total) by mouth in the morning and at bedtime.   NovoLIN 70/30 (70-30) 100 UNIT/ML injection Generic drug: insulin NPH-regular Human Inject 20 units  into the skin every morning and 5 units every afternoon with meals   pantoprazole 40 MG tablet Commonly known as: Protonix Take 1 tablet (40 mg total) by mouth 2 (two) times daily.   True Metrix Blood Glucose Test test strip Generic drug: glucose blood Use as instructed        Allergies:  Allergies  Allergen Reactions   Amoxicillin Swelling and Rash    Daughter is unsure if patient is allergic to Amoxicillin or Clarithromycin.   Bactrim [Sulfamethoxazole-Trimethoprim] Swelling   Clarithromycin Swelling and Rash    Daughter is unsure if Clarithromycin or Amoxicillin caused rash and swelling.    Past Medical History, Surgical history, Social history, and Family History were reviewed and updated.  Review of Systems: Review of Systems  Constitutional: Negative.   HENT: Negative.    Eyes: Negative.   Respiratory: Negative.    Cardiovascular: Negative.   Gastrointestinal: Negative.   Genitourinary: Negative.   Musculoskeletal: Negative.   Skin: Negative.   Neurological:  Positive for tingling.  Endo/Heme/Allergies: Negative.   Psychiatric/Behavioral: Negative.      Physical Exam:  height is 5\' 4"  (1.626 m) and weight is 145 lb (65.8 kg). Her oral temperature is 98.9 F (37.2 C). Her blood pressure is 141/70 (abnormal) and her pulse is 83. Her respiration is 18 and oxygen saturation is 100%.   Wt Readings from Last 3  Encounters:  04/14/21 145 lb (65.8 kg)  04/01/21 147 lb 12.8 oz (67 kg)  03/19/21 146 lb (66.2 kg)    Physical Exam Vitals reviewed.  HENT:     Head: Normocephalic and atraumatic.  Eyes:     Pupils: Pupils are equal, round, and reactive to light.  Cardiovascular:     Rate and Rhythm: Normal rate and regular rhythm.     Heart sounds: Normal heart sounds.  Pulmonary:     Effort: Pulmonary effort is normal.     Breath sounds: Normal breath sounds.  Abdominal:     General: Bowel sounds are normal.     Palpations: Abdomen is soft.     Comments:  Abdominal exam shows a well-healed laparotomy scar.  She has no fluid wave.  There is no guarding or rebound tenderness to palpation.  There is no palpable liver or spleen tip.  Musculoskeletal:        General: No tenderness or deformity. Normal range of motion.     Cervical back: Normal range of motion.  Lymphadenopathy:     Cervical: No cervical adenopathy.  Skin:    General: Skin is warm and dry.     Findings: No erythema or rash.  Neurological:     Mental Status: She is alert and oriented to person, place, and time.  Psychiatric:        Behavior: Behavior normal.        Thought Content: Thought content normal.        Judgment: Judgment normal.      Lab Results  Component Value Date   WBC 4.1 04/14/2021   HGB 12.3 04/14/2021   HCT 36.3 04/14/2021   MCV 91.9 04/14/2021   PLT 156 04/14/2021   Lab Results  Component Value Date   FERRITIN 603 (H) 10/02/2020   IRON 31 (L) 10/02/2020   TIBC 241 10/02/2020   UIBC 211 10/02/2020   IRONPCTSAT 13 (L) 10/02/2020   Lab Results  Component Value Date   RBC 3.95 04/14/2021   No results found for: KPAFRELGTCHN, LAMBDASER, KAPLAMBRATIO No results found for: IGGSERUM, IGA, IGMSERUM No results found for: TOTALPROTELP, ALBUMINELP, A1GS, A2GS, BETS, BETA2SER, GAMS, MSPIKE, SPEI   Chemistry      Component Value Date/Time   NA 133 (L) 04/01/2021 1310   K 3.9 04/01/2021 1310   CL 104 04/01/2021 1310   CO2 23 04/01/2021 1310   BUN 15 04/01/2021 1310   CREATININE 0.58 04/01/2021 1310      Component Value Date/Time   CALCIUM 9.6 04/01/2021 1310   ALKPHOS 178 (H) 04/01/2021 1310   AST 114 (H) 04/01/2021 1310   ALT 121 (H) 04/01/2021 1310   BILITOT 1.3 (H) 04/01/2021 1310       Impression and Plan: [name] [name] is a very pleasant 76 yo female from [place] with hepatocellular carcinoma and Hepatitis C.   Again, she is responded very nicely to treatment.  I am just very happy for her.  Her last MRI was done early  January.  I do not think we need another 1 probably until April.  I think that as long as the alpha-fetoprotein is holding steady, we can hold on the MRI until April.  We will go ahead with her treatment.  We will then plan to get her back in another 2 weeks.   Volanda Napoleon, MD 2/28/202312:20 PM

## 2021-04-14 NOTE — Patient Instructions (Signed)
[facility]  Discharge Instructions: Thank you for choosing [facility] to provide your oncology and hematology care.   If you have a lab appointment with the [facility], please go directly to the [facility] and check in at the registration area.  Wear comfortable clothing and clothing appropriate for easy access to any Portacath or PICC line.   We strive to give you quality time with your provider. You may need to reschedule your appointment if you arrive late (15 or more minutes).  Arriving late affects you and other patients whose appointments are after yours.  Also, if you miss three or more appointments without notifying the office, you may be dismissed from the clinic at the providers discretion.      For prescription refill requests, have your pharmacy contact our office and allow 72 hours for refills to be completed.    Today you received the following chemotherapy and/or immunotherapy agents Nivolumab.      To help prevent nausea and vomiting after your treatment, we encourage you to take your nausea medication as directed.  BELOW ARE SYMPTOMS THAT SHOULD BE REPORTED IMMEDIATELY: *FEVER GREATER THAN 100.4 F (38 C) OR HIGHER *CHILLS OR SWEATING *NAUSEA AND VOMITING THAT IS NOT CONTROLLED WITH YOUR NAUSEA MEDICATION *UNUSUAL SHORTNESS OF BREATH *UNUSUAL BRUISING OR BLEEDING *URINARY PROBLEMS (pain or burning when urinating, or frequent urination) *BOWEL PROBLEMS (unusual diarrhea, constipation, pain near the anus) TENDERNESS IN MOUTH AND THROAT WITH OR WITHOUT PRESENCE OF ULCERS (sore throat, sores in mouth, or a toothache) UNUSUAL RASH, SWELLING OR PAIN  UNUSUAL VAGINAL DISCHARGE OR ITCHING   Items with * indicate a potential emergency and should be followed up as soon as possible or go to the Emergency Department if any problems should occur.  Please show the CHEMOTHERAPY ALERT CARD or IMMUNOTHERAPY ALERT CARD at check-in to the  Emergency Department and triage nurse. Should you have questions after your visit or need to cancel or reschedule your appointment, please contact [facility]  [phone] and follow the prompts.  Office hours are 8:00 a.m. to 4:30 p.m. Monday - Friday. Please note that voicemails left after 4:00 p.m. may not be returned until the following business day.  We are closed weekends and major holidays. You have access to a nurse at all times for urgent questions. Please call the main number to the clinic [phone] and follow the prompts.  For any non-urgent questions, you may also contact your provider using MyChart. We now offer e-Visits for anyone 54 and older to request care online for non-urgent symptoms. For details visit [email or web address].   Also download the MyChart app! Go to the app store, search "MyChart", open the app, select [facility], and log in with your MyChart username and password.  Due to Covid, a mask is required upon entering the hospital/clinic. If you do not have a mask, one will be given to you upon arrival. For doctor visits, patients may have 1 support person aged 29 or older with them. For treatment visits, patients cannot have anyone with them due to current Covid guidelines and our immunocompromised population.

## 2021-04-14 NOTE — Progress Notes (Signed)
Okk to treat with today's AST and ALT values per Dr. Marin Olp.

## 2021-04-15 LAB — AFP TUMOR MARKER: AFP, Serum, Tumor Marker: 15.4 ng/mL — ABNORMAL HIGH (ref 0.0–9.2)

## 2021-04-16 ENCOUNTER — Other Ambulatory Visit: Payer: Self-pay

## 2021-04-16 ENCOUNTER — Encounter: Payer: Self-pay | Admitting: Hematology & Oncology

## 2021-04-17 ENCOUNTER — Other Ambulatory Visit: Payer: Self-pay

## 2021-04-22 ENCOUNTER — Telehealth: Payer: Self-pay | Admitting: Internal Medicine

## 2021-04-22 NOTE — Telephone Encounter (Signed)
Copied from [facility] [id number]. Topic: Appointment Scheduling - Scheduling Inquiry for Clinic ?>> Apr 21, 2021  4:40 PM Oneta Rack wrote: ?Reason for CRM: Caller completed the application for [facility] financial assistance and would like a call back from [facility] to schedule appointment. Caller states patient is a cancer patient and did not know the assistance was only for 6 months. Caller would like a follow up call as soon as possible to dicsuss the next step. ?

## 2021-04-23 ENCOUNTER — Encounter: Payer: Self-pay | Admitting: Gastroenterology

## 2021-04-23 ENCOUNTER — Ambulatory Visit (AMBULATORY_SURGERY_CENTER): Payer: Self-pay | Admitting: Gastroenterology

## 2021-04-23 ENCOUNTER — Other Ambulatory Visit: Payer: Self-pay

## 2021-04-23 VITALS — BP 129/73 | HR 81 | Temp 95.3°F | Resp 12 | Ht 64.0 in | Wt 146.0 lb

## 2021-04-23 DIAGNOSIS — Z1211 Encounter for screening for malignant neoplasm of colon: Secondary | ICD-10-CM

## 2021-04-23 DIAGNOSIS — K2950 Unspecified chronic gastritis without bleeding: Secondary | ICD-10-CM

## 2021-04-23 DIAGNOSIS — K275 Chronic or unspecified peptic ulcer, site unspecified, with perforation: Secondary | ICD-10-CM

## 2021-04-23 DIAGNOSIS — K2960 Other gastritis without bleeding: Secondary | ICD-10-CM

## 2021-04-23 MED ORDER — SODIUM CHLORIDE 0.9 % IV SOLN: 500.0000 mL | Freq: Once | INTRAVENOUS | Status: DC

## 2021-04-23 NOTE — Op Note (Addendum)
[facility] ?Patient Name: [name] [name] ?Procedure Date: 04/23/2021 10:41 AM ?MRN: [id number] ?Endoscopist: Thornton Park MD, MD ?Age: 76 ?Referring MD:  ?Date of Birth: 07/26/1945 ?Gender: Female ?Account #: [id number] ?Procedure:                Colonoscopy ?Indications:              Screening for colorectal malignant neoplasm, This  ?                          is the patient's first colonoscopy ?Medicines:                Monitored Anesthesia Care ?Procedure:                Pre-Anesthesia Assessment: ?                          - Prior to the procedure, a History and Physical  ?                          was performed, and patient medications and  ?                          allergies were reviewed. The patient's tolerance of  ?                          previous anesthesia was also reviewed. The risks  ?                          and benefits of the procedure and the sedation  ?                          options and risks were discussed with the patient.  ?                          All questions were answered, and informed consent  ?                          was obtained. Prior Anticoagulants: The patient has  ?                          taken no previous anticoagulant or antiplatelet  ?                          agents. ASA Grade Assessment: III - A patient with  ?                          severe systemic disease. After reviewing the risks  ?                          and benefits, the patient was deemed in  ?                          satisfactory condition to undergo the procedure. ?  After obtaining informed consent, the colonoscope  ?                          was passed under direct vision. Throughout the  ?                          procedure, the patient's blood pressure, pulse, and  ?                          oxygen saturations were monitored continuously. The  ?                          Olympus CF-[id number] (Serial# [id number]) Colonoscope was  ?                           introduced through the anus and advanced to the the  ?                          ileocolonic anastomosis. The colonoscopy was  ?                          performed without difficulty. The patient tolerated  ?                          the procedure well. The quality of the bowel  ?                          preparation was poor. The terminal ileum, ileocecal  ?                          valve, appendiceal orifice, and rectum were  ?                          photographed. ?Scope In: 11:02:58 AM ?Scope Out: 11:20:44 AM ?Scope Withdrawal Time: 0 hours 10 minutes 21 seconds  ?Total Procedure Duration: 0 hours 17 minutes 46 seconds  ?Findings:                 The perianal and digital rectal examinations were  ?                          normal. ?                          Multiple small and large-mouthed diverticula were  ?                          found throughout the colon. ?                          A moderate amount of stool was found in the entire  ?                          colon, interfering with visualization. ?  There was evidence of a prior side-to-side  ?                          ileo-colonic anastomosis in the ascending colon.  ?                          This was patent and was characterized by healthy  ?                          appearing mucosa. The anastomosis was traversed. ?Complications:            No immediate complications. ?Estimated Blood Loss:     Estimated blood loss: none. ?Impression:               - Preparation of the colon was poor. ?                          - Diverticulosis in the sigmoid colon and in the  ?                          descending colon. ?                          - Stool in the entire examined colon. ?                          - No specimens collected. ?Recommendation:           - Patient has a contact number available for  ?                          emergencies. The signs and symptoms of potential  ?                          delayed complications were  discussed with the  ?                          patient. Return to normal activities tomorrow.  ?                          Written discharge instructions were provided to the  ?                          patient. ?                          - High fiber diet. ?                          - Follow a high fiber diet. Drink at least 64  ?                          ounces of water daily. Add a daily stool bulking  ?                          agent such as psyllium (an exampled  would be  ?                          Metamucil). ?                          - Continue present medications. ?                          - Use Miralax 17 g daily as needed for constipation. ?                          - Emerging evidence supports eating a diet of  ?                          fruits, vegetables, grains, calcium, and yogurt  ?                          while reducing red meat and alcohol may reduce the  ?                          risk of colon cancer. ?Thornton Park MD, MD ?04/23/2021 11:37:38 AM ?This report has been signed electronically. ?

## 2021-04-23 NOTE — Progress Notes (Signed)
Called to room to assist during endoscopic procedure.  Patient ID and intended procedure confirmed with present staff. Received instructions for my participation in the procedure from the performing physician.  

## 2021-04-23 NOTE — Op Note (Signed)
[facility] ?Patient Name: [name] [name] [place] ?Procedure Date: 04/23/2021 10:41 AM ?MRN: [id number] ?Endoscopist: Thornton Park MD, MD ?Age: 76 ?Referring MD:  ?Date of Birth: 1945-04-16 ?Gender: Female ?Account #: [id number] ?Procedure:                Upper GI endoscopy ?Indications:              Follow-up of acute gastric ulcer with perforation  ?                          s/p lap omental Graham patch 08/09/20 ?Medicines:                Monitored Anesthesia Care ?Procedure:                Pre-Anesthesia Assessment: ?                          - Prior to the procedure, a History and Physical  ?                          was performed, and patient medications and  ?                          allergies were reviewed. The patient's tolerance of  ?                          previous anesthesia was also reviewed. The risks  ?                          and benefits of the procedure and the sedation  ?                          options and risks were discussed with the patient.  ?                          All questions were answered, and informed consent  ?                          was obtained. Prior Anticoagulants: The patient has  ?                          taken no previous anticoagulant or antiplatelet  ?                          agents. ASA Grade Assessment: III - A patient with  ?                          severe systemic disease. After reviewing the risks  ?                          and benefits, the patient was deemed in  ?                          satisfactory condition to undergo the procedure. ?  After obtaining informed consent, the endoscope was  ?                          passed under direct vision. Throughout the  ?                          procedure, the patient's blood pressure, pulse, and  ?                          oxygen saturations were monitored continuously. The  ?                          GIF [id number] #[id number] was introduced through the  ?                           mouth, and advanced to the third part of duodenum.  ?                          The upper GI endoscopy was accomplished without  ?                          difficulty. The patient tolerated the procedure  ?                          well. ?Scope In: ?Scope Out: ?Findings:                 The examined esophagus was normal. ?                          Diffuse moderate inflammation characterized by  ?                          erythema, friability and granularity was found in  ?                          the gastric fundus and in the gastric body. In two  ?                          areas in the body, this had a slightly nodular  ?                          appearance. Biopsies were taken from the antrum,  ?                          body, and fundus with a cold forceps for histology.  ?                          Estimated blood loss was minimal. ?                          The examined duodenum was normal. ?                          The cardia and gastric  fundus were normal on  ?                          retroflexion. ?                          The exam was otherwise without abnormality. ?Complications:            No immediate complications. Estimated blood loss:  ?                          Minimal. ?Estimated Blood Loss:     Estimated blood loss was minimal. ?Impression:               - Normal esophagus. ?                          - Gastritis. Biopsied. ?                          - Normal examined duodenum. ?                          - The examination was otherwise normal. ?Recommendation:           - Patient has a contact number available for  ?                          emergencies. The signs and symptoms of potential  ?                          delayed complications were discussed with the  ?                          patient. Return to normal activities tomorrow.  ?                          Written discharge instructions were provided to the  ?                          patient. ?                          - Resume previous  diet. ?                          - Continue present medications including  ?                          pantoprazole 40 mg BID. ?                          - Await pathology results. ?                          - No aspirin, ibuprofen, naproxen, or other  ?                          non-steroidal anti-inflammatory drugs. ?Thornton Park  MD, MD ?04/23/2021 11:32:34 AM ?This report has been signed electronically. ?

## 2021-04-23 NOTE — Progress Notes (Signed)
VS by DT  Pt's states no medical or surgical changes since previsit or office visit.  

## 2021-04-23 NOTE — Patient Instructions (Addendum)
Handouts were given to your care partner on Gastritis, diverticulosis, and a high fiber diet with liberal fluid intake. ?Your sugar was 133 in the recovery room. ?Continue taking PANTOPRAZOLE 40 mg 2 x daily. ?NO ASPIRIN, ASPIRIN CONTAINING PRODUCTS (BC OR GOODY POWDERS) OR NSAIDS (IBUPROFEN, ADVIL, ALEVE, AND MOTRIN); TYLENOL IS OK TO TAKE if needed. ?Use over the counter MIRALAX 17 grams or 1 capful daily as needed for constipation. ?You may resume your current medications today. ?Follow a high fiber diet.  Drink at least 64 ounces of water daily.  Add over the counter fiber supplement - stool bulking agent such as psyllium ( Metamucil). ?Await biopsy results.  May take 1-3 weeks to receive pathology results. ?Please call if any questions or concerns. ?  ? ? ? ?YOU HAD AN ENDOSCOPIC PROCEDURE TODAY AT [facility]:   Refer to the procedure report that was given to you for any specific questions about what was found during the examination.  If the procedure report does not answer your questions, please call your gastroenterologist to clarify.  If you requested that your care partner not be given the details of your procedure findings, then the procedure report has been included in a sealed envelope for you to review at your convenience later. ? ?YOU SHOULD EXPECT: Some feelings of bloating in the abdomen. Passage of more gas than usual.  Walking can help get rid of the air that was put into your GI tract during the procedure and reduce the bloating. If you had a lower endoscopy (such as a colonoscopy or flexible sigmoidoscopy) you may notice spotting of blood in your stool or on the toilet paper. If you underwent a bowel prep for your procedure, you may not have a normal bowel movement for a few days. ? ?Please Note:  You might notice some irritation and congestion in your nose or some drainage.  This is from the oxygen used during your procedure.  There is no need for concern and it should clear up  in a day or so. ? ?SYMPTOMS TO REPORT IMMEDIATELY: ? ?Following lower endoscopy (colonoscopy or flexible sigmoidoscopy): ? Excessive amounts of blood in the stool ? Significant tenderness or worsening of abdominal pains ? Swelling of the abdomen that is new, acute ? Fever of 100?F or higher ? ?Following upper endoscopy (EGD) ? Vomiting of blood or coffee ground material ? New chest pain or pain under the shoulder blades ? Painful or persistently difficult swallowing ? New shortness of breath ? Fever of 100?F or higher ? Black, tarry-looking stools ? ?For urgent or emergent issues, a gastroenterologist can be reached at any hour by calling [phone]. ?Do not use MyChart messaging for urgent concerns.  ? ? ?DIET:  We do recommend a small meal at first, but then you may proceed to your regular diet.  Drink plenty of fluids but you should avoid alcoholic beverages for 24 hours. ? ?ACTIVITY:  You should plan to take it easy for the rest of today and you should NOT DRIVE or use heavy machinery until tomorrow (because of the sedation medicines used during the test).   ? ?FOLLOW UP: ?Our staff will call the number listed on your records 48-72 hours following your procedure to check on you and address any questions or concerns that you may have regarding the information given to you following your procedure. If we do not reach you, we will leave a message.  We will attempt to reach you two  times.  During this call, we will ask if you have developed any symptoms of COVID 19. If you develop any symptoms (ie: fever, flu-like symptoms, shortness of breath, cough etc.) before then, please call [phone].  If you test positive for Covid 19 in the 2 weeks post procedure, please call and report this information to [place].   ? ?If any biopsies were taken you will be contacted by phone or by letter within the next 1-3 weeks.  Please call us at [phone] if you have not heard about the biopsies in 3 weeks.   ? ? ?SIGNATURES/CONFIDENTIALITY: ?You and/or your care partner have signed paperwork which will be entered into your electronic medical record.  These signatures attest to the fact that that the information above on your After Visit Summary has been reviewed and is understood.  Full responsibility of the confidentiality of this discharge information lies with you and/or your care-partner.  ?

## 2021-04-23 NOTE — Progress Notes (Signed)
VSS, transported to PACU °

## 2021-04-23 NOTE — Progress Notes (Signed)
Pt was slower to wake up.  I let her stay a few minuted longer till she was more awake.   ? ?No problems noted in the recovery room. maw  ?

## 2021-04-23 NOTE — Progress Notes (Signed)
? ?Referring Provider: Ladell Pier, MD ?Primary Care Physician:  Ladell Pier, MD ? ?Indication for EGD:  Perforated abdominal ulcer ?Indication for Colonoscopy: Colon cancer screening ? ?IMPRESSION:  ?Perforated ulcer repair: Surgeon requested follow-up EGD. ? ?Personal history of colon cancer: No prior surveillance since resection in 2004. She would like to proceed with colonoscopy now.  ? ?Chronic constipation: Better since starting immunotherapy.  ? ?She is an appropriate candidate for monitored anesthesia care in the [place].  ? ?PLAN: ?EGD ?Colonoscopy ? ? ?HPI: Khloi Rawl is a 76 y.o. female presents for endoscopic evaluation. 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 Diagnosed with colon cancer while in [place] in 2004 treated with surgery and chemotherapy.  ? ?Perforated gastric ulcer s/p lap omental [place] patch 08/09/2020.  Noted to have liver masses intraoperatively which were biopsied and confirmed to be hepatocellular carcinoma.  She was also found to be positive for HCV.  Her postoperative course was uneventful. Referred to [facility] for an EGD to follow-up on her ulcer repair. She would like to have them performed here due to cost and insurance concerns.  ? ?She is currently receiving immunotherapy for her [place]. Treatment held once due to elevated LFTs.  ? ?Saw ID to consider HCV treatment. They recommended deferring treatment until [facility] is treated.  ? ?She has a long-standing history of chronic constipation.  Having one bowel movement daiy but she has a history of less frequent bowel movements in the past.  No history of colonoscopy. She would really like to have a colonoscopy now.  ? ?There is no known family history of colon cancer or polyps. No family history of stomach cancer or other GI malignancy. No family history of inflammatory  bowel disease or celiac.  ? ? ?Past Medical History:  ?Diagnosis Date  ? Colon cancer ([place])   ? Diabetes mellitus without complication ([place])   ? Glaucoma   ? Goals of care, counseling/discussion 09/01/2020  ? Hepatitis C   ? Hepatocellular carcinoma ([place])   ? Hypertension   ? ? ?Past Surgical History:  ?Procedure Laterality Date  ? COLON RESECTION  2005  ? In [place] - ?colon cancer  ? IR IMAGING GUIDED PORT INSERTION  09/18/2020  ? LAPAROSCOPIC GASTRIC RESECTION N/A 08/09/2020  ? Procedure: LAPAROSCOPIC EXPLORATION OF ABDOMEN, PERFORATED ULCER REPAIR, LIVER BIOPSY;  Surgeon: Michael Boston, MD;  Location: [facility];  Service: General;  Laterality: N/A;  ? ? ?Current Outpatient Medications  ?Medication Sig Dispense Refill  ? brimonidine (ALPHAGAN) 0.2 % ophthalmic solution 1 drop 2 (two) times daily.    ? glucose blood (TRUE METRIX BLOOD GLUCOSE TEST) test strip Use as instructed 100 each 12  ? insulin NPH-regular Human (NOVOLIN 70/30) (70-30) 100 UNIT/ML injection Inject 20 units into the skin every morning and 5 units every afternoon with meals 10 mL 3  ? losartan (COZAAR) 50 MG tablet Take 1 tablet (50 mg total) by mouth at bedtime. 30 tablet 6  ? NIFEdipine (PROCARDIA-XL/NIFEDICAL-XL) 30 MG 24 hr tablet Take 1 tablet (30 mg total) by mouth in the morning and at bedtime. 60 tablet 0  ? pantoprazole (PROTONIX) 40 MG tablet Take 1 tablet (40 mg total) by mouth 2 (two) times daily. 60 tablet 3  ? ?Current Facility-Administered Medications  ?Medication Dose Route Frequency Provider Last Rate Last Admin  ? 0.9 %  sodium chloride infusion  500 mL Intravenous Once Thornton Park, MD      ? ?Facility-Administered Medications Ordered in Other Visits  ?Medication Dose Route Frequency Provider Last Rate Last Admin  ? sodium chloride flush (NS) 0.9 % injection 10 mL  10 mL Intravenous PRN Celso Amy, NP   10 mL at 12/18/20 1009  ? ? ?Allergies as of 04/23/2021 - Review Complete 04/23/2021  ?Allergen Reaction Noted  ?  Amoxicillin Swelling and Rash 09/01/2020  ? Bactrim [sulfamethoxazole-trimethoprim] Swelling 08/08/2020  ? Clarithromycin Swelling and Rash 09/01/2020  ? ? ?Family History  ?Problem Relation Age of Onset  ? Cancer Father   ?     type unknown, was on the leg  ? Other Daughter   ?     Acoustic neuroma  ? Breast cancer Neg Hx   ? Colon cancer Neg Hx   ? Esophageal cancer Neg Hx   ? Rectal cancer Neg Hx   ? Stomach cancer Neg Hx   ? ? ? ? ? ?Physical Exam: ?General:   Alert,  well-nourished, pleasant and cooperative in NAD ?Head:  Normocephalic and atraumatic. ?Eyes:  Sclera clear, no icterus.   Conjunctiva pink. ?Ears:  Normal auditory acuity. ?Nose:  No deformity, discharge,  or lesions. ?Mouth:  No deformity or lesions.   ?Neck:  Supple; no masses or thyromegaly. ?Lungs:  Clear throughout to auscultation.   No wheezes. ?Heart:  Regular rate and rhythm; no murmurs. ?Abdomen:  Soft, nontender, nondistended, normal bowel sounds, no rebound or guarding. No hepatosplenomegaly.   ?Rectal:  Deferred  ?Msk:  Symmetrical. No boney deformities ?LAD: No inguinal or umbilical LAD ?Extremities:  No clubbing or edema. ?Neurologic:  Alert and  oriented x4;  grossly nonfocal ?Skin:  Intact without significant lesions or rashes. ?Psych:  Alert and cooperative. Normal mood and affect. ? ? ? ?Glessie Eustice L. Tarri Glenn, MD, MPH ?04/23/2021, 10:36 AM ? ? ? ?  ?

## 2021-04-24 ENCOUNTER — Ambulatory Visit (INDEPENDENT_AMBULATORY_CARE_PROVIDER_SITE_OTHER): Payer: Self-pay | Admitting: Internal Medicine

## 2021-04-24 ENCOUNTER — Other Ambulatory Visit: Payer: Self-pay

## 2021-04-24 ENCOUNTER — Encounter: Payer: Self-pay | Admitting: Internal Medicine

## 2021-04-24 VITALS — BP 137/81 | HR 82 | Temp 97.8°F | Wt 144.0 lb

## 2021-04-24 DIAGNOSIS — K5909 Other constipation: Secondary | ICD-10-CM

## 2021-04-24 DIAGNOSIS — C220 Liver cell carcinoma: Secondary | ICD-10-CM

## 2021-04-24 DIAGNOSIS — B182 Chronic viral hepatitis C: Secondary | ICD-10-CM

## 2021-04-24 DIAGNOSIS — Z1211 Encounter for screening for malignant neoplasm of colon: Secondary | ICD-10-CM

## 2021-04-24 MED ORDER — POLYETHYLENE GLYCOL 3350 17 GM/SCOOP PO POWD: 17.0000 g | Freq: Every day | ORAL | Status: DC | PRN

## 2021-04-24 NOTE — Patient Instructions (Signed)
Let's follow up in mid 06/2021. If your liver cancer remains controlled will initiate treatment plan for your hep C. ? ?The first thing before treatment starting is to do a special imaging, which would give [place] an idea how long to treat you for (12 vs 24 weeks) ? ? ?I have included here an online website to review why we defer treatment of hep c until the liver cancer is well treated ? ? ?[email or web address] ?

## 2021-04-24 NOTE — Progress Notes (Signed)
?  ? ? ? ? ?[facility] for Infectious Disease ? ?Patient Active Problem List  ? Diagnosis Date Noted  ? Hypercalcemia 09/22/2020  ? Chronic hepatitis C with hepatic coma ([place]) 09/22/2020  ? Goals of care, counseling/discussion 09/01/2020  ? Hepatocellular carcinoma ([place]) 08/14/2020  ? Hyperglycemia 08/10/2020  ? Pneumoperitoneum 08/09/2020  ? History of colorectal cancer 08/09/2020  ? Liver mass, left lobe 08/09/2020  ? Insulin-requiring or dependent type II diabetes mellitus ([place]) 08/09/2020  ? Hypertension associated with diabetes ([place]) 08/09/2020  ? Constipation, chronic 08/09/2020  ? Perforated gastric ulcer s/p lap omental Phillip Heal patch 08/09/2020 08/09/2020  ? ? ? ? ?Subjective:  ? ? Patient ID: [name] [name], female    DOB: [birth year], 76 y.o.   MRN: [id number] ? ?Chief Complaint  ?Patient presents with  ? New Patient (Initial Visit)  ? ? ?HPI: ? ?[name] [name] is a 76 y.o. female with hcc/chronic hep c, here for evaluation for hep c treatment ? ?She is known to our clinic. When she was first evaluated a year ago, decision to treat hep c was deferred given her active bulky HCC ? ?She has been undergoing hcc treatment with oncology via immune therapy and her lesion has improved significantly on 02/2021 mra. There is a plan to repeat mra in 05/2021 ? ?She is feeling well. No fatigue, ascites, blood/melena per rectum, joint pain, skin rash ? ?She has mild-moderate lft elevation that is stable ? ?Of note, the mra did  mention sign of cirrhosis and portal hypertension with trace ascites ?She has egd 3/09 which saw gastritis but no varices. Colonoscopy same day also no varices except sigmoid diverticulosis ? ?She never needed paracentesis or uses diuretics for hx of ascites. No hx gib ? ? ?Allergies  ?Allergen Reactions  ? Amoxicillin Swelling and Rash  ?  Daughter is unsure if patient is allergic to Amoxicillin or Clarithromycin.  ? Bactrim [Sulfamethoxazole-Trimethoprim] Swelling  ?  Clarithromycin Swelling and Rash  ?  Daughter is unsure if Clarithromycin or Amoxicillin caused rash and swelling.  ? ? ? ? ?Outpatient Medications Prior to Visit  ?Medication Sig Dispense Refill  ? brimonidine (ALPHAGAN) 0.2 % ophthalmic solution 1 drop 2 (two) times daily.    ? glucose blood (TRUE METRIX BLOOD GLUCOSE TEST) test strip Use as instructed 100 each 12  ? insulin NPH-regular Human (NOVOLIN 70/30) (70-30) 100 UNIT/ML injection Inject 20 units into the skin every morning and 5 units every afternoon with meals 10 mL 3  ? losartan (COZAAR) 50 MG tablet Take 1 tablet (50 mg total) by mouth at bedtime. 30 tablet 6  ? NIFEdipine (PROCARDIA-XL/NIFEDICAL-XL) 30 MG 24 hr tablet Take 1 tablet (30 mg total) by mouth in the morning and at bedtime. 60 tablet 0  ? pantoprazole (PROTONIX) 40 MG tablet Take 1 tablet (40 mg total) by mouth 2 (two) times daily. 60 tablet 3  ? polyethylene glycol powder (GLYCOLAX/MIRALAX) 17 GM/SCOOP powder Take 17 g by mouth daily as needed.    ? ?Facility-Administered Medications Prior to Visit  ?Medication Dose Route Frequency Provider Last Rate Last Admin  ? sodium chloride flush (NS) 0.9 % injection 10 mL  10 mL Intravenous PRN Celso Amy, NP   10 mL at 12/18/20 1009  ? ? ? ?Social History  ? ?Socioeconomic History  ? Marital status: Widowed  ?  Spouse name: Not on file  ? Number of children: 5  ? Years of education: 71  ? Highest education  level: Associate degree: occupational, Hotel manager, or vocational program  ?Occupational History  ? Not on file  ?Tobacco Use  ? Smoking status: Never  ? Smokeless tobacco: Never  ?Vaping Use  ? Vaping Use: Never used  ?Substance and Sexual Activity  ? Alcohol use: Not Currently  ? Drug use: Never  ? Sexual activity: Not Currently  ?Other Topics Concern  ? Not on file  ?Social History Narrative  ? Originally from [place].  Speaks [place] and [place]  ? ?Social Determinants of Health  ? ?Financial Resource Strain: Not on file  ?Food Insecurity:  Not on file  ?Transportation Needs: Not on file  ?Physical Activity: Not on file  ?Stress: Not on file  ?Social Connections: Not on file  ?Intimate Partner Violence: Not on file  ? ? ? ? ?Review of Systems ?   ?All other ros negative ? ?Objective:  ?  ?BP 137/81   Pulse 82   Temp 97.8 ?F (36.6 ?C) (Oral)   Wt 144 lb (65.3 kg)   SpO2 100%   BMI 24.72 kg/m?  ?Nursing note and vital signs reviewed. ? ?Physical Exam ? ?   ?General/constitutional: no distress, pleasant ?HEENT: Normocephalic, PER, Conj Clear, EOMI, Oropharynx clear ?Neck supple ?CV: rrr no mrg ?Lungs: clear to auscultation, normal respiratory effort ?Abd: Soft, Nontender ?Ext: no edema ?Skin: No Rash ?Neuro: nonfocal ?MSK: no peripheral joint swelling/tenderness/warmth; back spines nontender ? ? ? ?Labs: ?Lab Results  ?Component Value Date  ? WBC 4.1 04/14/2021  ? HGB 12.3 04/14/2021  ? HCT 36.3 04/14/2021  ? MCV 91.9 04/14/2021  ? PLT 156 04/14/2021  ? ?Last metabolic panel ?Lab Results  ?Component Value Date  ? GLUCOSE 112 (H) 04/14/2021  ? NA 135 04/14/2021  ? K 3.9 04/14/2021  ? CL 104 04/14/2021  ? CO2 25 04/14/2021  ? BUN 15 04/14/2021  ? CREATININE 0.62 04/14/2021  ? GFRNONAA >60 04/14/2021  ? CALCIUM 9.7 04/14/2021  ? PHOS 2.5 08/10/2020  ? PHOS 2.5 08/10/2020  ? PROT 7.2 04/14/2021  ? ALBUMIN 3.4 (L) 04/14/2021  ? BILITOT 1.0 04/14/2021  ? ALKPHOS 191 (H) 04/14/2021  ? AST 105 (H) 04/14/2021  ? ALT 110 (H) 04/14/2021  ? ANIONGAP 6 04/14/2021  ? ?No results found for: INR, PROTIME ? ? ?Micro: ? ?Serology: ? ?Imaging: ? ?Assessment & Plan:  ? ?Problem List Items Addressed This Visit   ? ?  ? Digestive  ? Hepatocellular carcinoma ([place]) - Primary  ? ?Other Visit Diagnoses   ? ? Chronic hepatitis C without hepatic coma (HCC)      ? ?  ? ?Reviewed natural hx of chronic hep c ?Reviewed treatment benefit and treatment initiation/timing in special cases like her who has high burden nonresectable hcc ? ?Currently while there is potentially association  with hep c and hcc carcinogenesis, it is unclear if not treating hep c will decrease chance of cure of hcc. The reverse seems to be true (with active hcc, the rate of cure of hep c with DAA is much lower). As she only has 2 shots at DAA I would like to see her at least stable from [facility] standpoint in terms of cure for 3-6 months before initiating DAA ? ?I agree that treating hep C successfully will reduce the HCC recurrent rate and also improve all-cause mortality for her ? ? ?-follow up in 5. She will have repeat hcc staging in 05/2021 with oncology so will see if by early summer it is suitable  to initiate hep c treatment ?-at this time she has trace ascites pushing her to the side of decompensated cirrhosis although clinically otherwise appear to be compensated. Will discuss 12 vs 24 weeks of DAA treatment length with her at that time, after a full abdominal ultrasound/elastography if no new information is obtained from mri ? ? ? ? ? ? ?No orders of the defined types were placed in this encounter. ? ? ? ? ? ? ?Follow-up: Return in about 2 months (around 06/24/2021). ? ? ? ? ? ?Jabier Mutton, MD ?[facility] ?[place] ?[phone]  pager   [phone] cell ?04/24/2021, 4:09 PM ? ?

## 2021-04-27 ENCOUNTER — Telehealth: Payer: Self-pay | Admitting: *Deleted

## 2021-04-27 ENCOUNTER — Telehealth: Payer: Self-pay

## 2021-04-27 NOTE — Telephone Encounter (Signed)
?  Follow up Call- ? ?Call back number 04/23/2021  ?Post procedure Call Back phone  # [phone]- daughter  ?Permission to leave phone message Yes  ?  ? ?Patient questions: ? ?Do you have a fever, pain , or abdominal swelling? No. ?Pain Score  0 * ? ?Have you tolerated food without any problems? Yes.   ? ?Have you been able to return to your normal activities? Yes.   ? ?Do you have any questions about your discharge instructions: ?Diet   No. ?Medications  No. ?Follow up visit  No. ? ?Do you have questions or concerns about your Care? No. ? ?Actions: ?* If pain score is 4 or above: ?No action needed, pain <4. ? ? ?

## 2021-04-27 NOTE — Telephone Encounter (Signed)
Left message on f/u call 

## 2021-04-29 ENCOUNTER — Encounter: Payer: Self-pay | Admitting: Hematology & Oncology

## 2021-04-29 ENCOUNTER — Other Ambulatory Visit: Payer: Self-pay

## 2021-04-29 ENCOUNTER — Other Ambulatory Visit: Payer: Self-pay | Admitting: Pharmacist

## 2021-04-29 MED ORDER — HUMULIN 70/30 (70-30) 100 UNIT/ML ~~LOC~~ SUSP
SUBCUTANEOUS | 0 refills | Status: DC
  Filled 2021-04-29: qty 10, 40d supply, fill #0

## 2021-04-29 NOTE — Telephone Encounter (Signed)
I return Pt call, schedule a financial appt ?

## 2021-04-30 ENCOUNTER — Other Ambulatory Visit: Payer: Self-pay

## 2021-04-30 ENCOUNTER — Inpatient Hospital Stay: Payer: No Typology Code available for payment source | Attending: Hematology & Oncology

## 2021-04-30 ENCOUNTER — Inpatient Hospital Stay (HOSPITAL_BASED_OUTPATIENT_CLINIC_OR_DEPARTMENT_OTHER): Payer: Self-pay | Admitting: Hematology & Oncology

## 2021-04-30 ENCOUNTER — Encounter: Payer: Self-pay | Admitting: Hematology & Oncology

## 2021-04-30 ENCOUNTER — Inpatient Hospital Stay: Payer: No Typology Code available for payment source

## 2021-04-30 ENCOUNTER — Telehealth: Payer: Self-pay | Admitting: *Deleted

## 2021-04-30 VITALS — BP 146/66 | HR 87 | Temp 98.9°F | Resp 18 | Wt 143.0 lb

## 2021-04-30 DIAGNOSIS — C220 Liver cell carcinoma: Secondary | ICD-10-CM | POA: Insufficient documentation

## 2021-04-30 DIAGNOSIS — Z5112 Encounter for antineoplastic immunotherapy: Secondary | ICD-10-CM | POA: Insufficient documentation

## 2021-04-30 DIAGNOSIS — B1920 Unspecified viral hepatitis C without hepatic coma: Secondary | ICD-10-CM | POA: Insufficient documentation

## 2021-04-30 LAB — CBC WITH DIFFERENTIAL (CANCER CENTER ONLY)
Abs Immature Granulocytes: 0 10*3/uL (ref 0.00–0.07)
Basophils Absolute: 0 10*3/uL (ref 0.0–0.1)
Basophils Relative: 1 %
Eosinophils Absolute: 0 10*3/uL (ref 0.0–0.5)
Eosinophils Relative: 1 %
HCT: 36.9 % (ref 36.0–46.0)
Hemoglobin: 12.5 g/dL (ref 12.0–15.0)
Immature Granulocytes: 0 %
Lymphocytes Relative: 37 %
Lymphs Abs: 1.6 10*3/uL (ref 0.7–4.0)
MCH: 31.2 pg (ref 26.0–34.0)
MCHC: 33.9 g/dL (ref 30.0–36.0)
MCV: 92 fL (ref 80.0–100.0)
Monocytes Absolute: 0.3 10*3/uL (ref 0.1–1.0)
Monocytes Relative: 7 %
Neutro Abs: 2.4 10*3/uL (ref 1.7–7.7)
Neutrophils Relative %: 54 %
Platelet Count: 163 10*3/uL (ref 150–400)
RBC: 4.01 MIL/uL (ref 3.87–5.11)
RDW: 14.6 % (ref 11.5–15.5)
WBC Count: 4.3 10*3/uL (ref 4.0–10.5)
nRBC: 0 % (ref 0.0–0.2)

## 2021-04-30 LAB — CMP (CANCER CENTER ONLY)
ALT: 79 U/L — ABNORMAL HIGH (ref 0–44)
AST: 75 U/L — ABNORMAL HIGH (ref 15–41)
Albumin: 3.8 g/dL (ref 3.5–5.0)
Alkaline Phosphatase: 231 U/L — ABNORMAL HIGH (ref 38–126)
Anion gap: 6 (ref 5–15)
BUN: 15 mg/dL (ref 8–23)
CO2: 25 mmol/L (ref 22–32)
Calcium: 10.2 mg/dL (ref 8.9–10.3)
Chloride: 103 mmol/L (ref 98–111)
Creatinine: 0.6 mg/dL (ref 0.44–1.00)
GFR, Estimated: >60 mL/min (ref >60)
Glucose, Bld: 217 mg/dL — ABNORMAL HIGH (ref 70–99)
Potassium: 3.8 mmol/L (ref 3.5–5.1)
Sodium: 134 mmol/L — ABNORMAL LOW (ref 135–145)
Total Bilirubin: 0.9 mg/dL (ref 0.3–1.2)
Total Protein: 7.8 g/dL (ref 6.5–8.1)

## 2021-04-30 LAB — LACTATE DEHYDROGENASE: LDH: 170 U/L (ref 98–192)

## 2021-04-30 MED ORDER — SODIUM CHLORIDE 0.9 % IV SOLN: Freq: Once | INTRAVENOUS | Status: AC

## 2021-04-30 MED ORDER — SODIUM CHLORIDE 0.9% FLUSH
10.0000 mL | INTRAVENOUS | Status: DC | PRN
  Administered 2021-04-30: 10 mL

## 2021-04-30 MED ORDER — HEPARIN SOD (PORK) LOCK FLUSH 100 UNIT/ML IV SOLN
500.0000 [IU] | Freq: Once | INTRAVENOUS | Status: AC | PRN
  Administered 2021-04-30: 500 [IU]

## 2021-04-30 MED ORDER — SODIUM CHLORIDE 0.9 % IV SOLN
240.0000 mg | Freq: Once | INTRAVENOUS | Status: AC
  Administered 2021-04-30: 240 mg via INTRAVENOUS
  Filled 2021-04-30: qty 24

## 2021-04-30 NOTE — Progress Notes (Signed)
Ok to treat with todays labs per dr Marin Olp.   ?

## 2021-04-30 NOTE — Progress Notes (Signed)
?Hematology and Oncology Follow Up Visit ? ?[name] [name] ?[id number] ?[birth year] 76 y.o. ?04/30/2021 ? ? ?Principle Diagnosis:  ?Hepatocellular carcinoma-multifocal ?Hepatitis C ?  ?Current Therapy:        ?Status post cycle 1 of nivolumab/ipilimumab -- d/c on 10/13/2020 due to hepatic toxicity ?Nivolumab 240 mg IV every 2 weeks - s/p cycle #10 -- started 11/21/2020 ?  ?Interim History:  [name] [name] is here today for follow-up and treatment.  She is doing quite well.  She has tolerated treatment nicely.  Her last alpha-fetoprotein was 15.4 which is holding steady. ? ?She did see Infectious Disease for the Hepatitis C.  ? ?She did have an upper and lower endoscopy.  This was done on 04/23/2021.  There was some moderate inflammation and erythema of the gastric fundus.  Biopsies were taken.  There was some chronic active gastritis.  Helicobacter was negative. ? ?I still think that we might be able to utilize Avastin at some point in the future.  Again, she is she is doing pretty well with a single agent nivolumab, I think be reasonable to continue her on single agent nivolumab. ? ?She has had no problems with bowels or bladder.  She has had no cough or shortness of breath.  She has had no rashes.  There is been no leg swelling.  She has had no issues with her diabetes. ? ?Overall, I would say performance status is probably ECOG 1.    ? ?Medications:  ?Allergies as of 04/30/2021   ? ?   Reactions  ? Amoxicillin Swelling, Rash  ? Daughter is unsure if patient is allergic to Amoxicillin or Clarithromycin.  ? Bactrim [sulfamethoxazole-trimethoprim] Swelling  ? Clarithromycin Swelling, Rash  ? Daughter is unsure if Clarithromycin or Amoxicillin caused rash and swelling.  ? ?  ? ?  ?Medication List  ?  ? ?  ? Accurate as of April 30, 2021  2:03 PM. If you have any questions, ask your nurse or doctor.  ?  ?  ? ?  ? ?brimonidine 0.2 % ophthalmic solution ?Commonly known as: ALPHAGAN ?1 drop 2 (two) times  daily. ?  ?HumuLIN 70/30 (70-30) 100 UNIT/ML injection ?Generic drug: insulin NPH-regular Human ?Inject 20 units into the skin every morning and 5 units every afternoon with meals ?  ?losartan 50 MG tablet ?Commonly known as: COZAAR ?Take 1 tablet (50 mg total) by mouth at bedtime. ?  ?NIFEdipine 30 MG 24 hr tablet ?Commonly known as: PROCARDIA-XL/NIFEDICAL-XL ?Take 1 tablet (30 mg total) by mouth in the morning and at bedtime. ?  ?pantoprazole 40 MG tablet ?Commonly known as: Protonix ?Take 1 tablet (40 mg total) by mouth 2 (two) times daily. ?  ?polyethylene glycol powder 17 GM/SCOOP powder ?Commonly known as: GLYCOLAX/MIRALAX ?Take 17 g by mouth daily as needed. ?  ?True Metrix Blood Glucose Test test strip ?Generic drug: glucose blood ?Use as instructed ?  ? ?  ? ? ?Allergies:  ?Allergies  ?Allergen Reactions  ? Amoxicillin Swelling and Rash  ?  Daughter is unsure if patient is allergic to Amoxicillin or Clarithromycin.  ? Bactrim [Sulfamethoxazole-Trimethoprim] Swelling  ? Clarithromycin Swelling and Rash  ?  Daughter is unsure if Clarithromycin or Amoxicillin caused rash and swelling.  ? ? ?Past Medical History, Surgical history, Social history, and Family History were reviewed and updated. ? ?Review of Systems: ?Review of Systems  ?Constitutional: Negative.   ?HENT: Negative.    ?Eyes: Negative.   ?Respiratory: Negative.    ?  Cardiovascular: Negative.   ?Gastrointestinal: Negative.   ?Genitourinary: Negative.   ?Musculoskeletal: Negative.   ?Skin: Negative.   ?Neurological:  Positive for tingling.  ?Endo/Heme/Allergies: Negative.   ?Psychiatric/Behavioral: Negative.    ? ? ?Physical Exam: ? weight is 64.9 kg. Her oral temperature is 98.9 ?F (37.2 ?C). Her blood pressure is 146/66 (abnormal) and her pulse is 87. Her respiration is 18 and oxygen saturation is 100%.  ? ?Wt Readings from Last 3 Encounters:  ?04/30/21 64.9 kg  ?04/24/21 65.3 kg  ?04/23/21 66.2 kg  ? ? ?Physical Exam ?Vitals reviewed.  ?HENT:  ?    Head: Normocephalic and atraumatic.  ?Eyes:  ?   Pupils: Pupils are equal, round, and reactive to light.  ?Cardiovascular:  ?   Rate and Rhythm: Normal rate and regular rhythm.  ?   Heart sounds: Normal heart sounds.  ?Pulmonary:  ?   Effort: Pulmonary effort is normal.  ?   Breath sounds: Normal breath sounds.  ?Abdominal:  ?   General: Bowel sounds are normal.  ?   Palpations: Abdomen is soft.  ?   Comments: Abdominal exam shows a well-healed laparotomy scar.  She has no fluid wave.  There is no guarding or rebound tenderness to palpation.  There is no palpable liver or spleen tip.  ?Musculoskeletal:     ?   General: No tenderness or deformity. Normal range of motion.  ?   Cervical back: Normal range of motion.  ?Lymphadenopathy:  ?   Cervical: No cervical adenopathy.  ?Skin: ?   General: Skin is warm and dry.  ?   Findings: No erythema or rash.  ?Neurological:  ?   Mental Status: She is alert and oriented to person, place, and time.  ?Psychiatric:     ?   Behavior: Behavior normal.     ?   Thought Content: Thought content normal.     ?   Judgment: Judgment normal.  ? ?  ? ?Lab Results  ?Component Value Date  ? WBC 4.3 04/30/2021  ? HGB 12.5 04/30/2021  ? HCT 36.9 04/30/2021  ? MCV 92.0 04/30/2021  ? PLT 163 04/30/2021  ? ?Lab Results  ?Component Value Date  ? FERRITIN 603 (H) 10/02/2020  ? IRON 31 (L) 10/02/2020  ? TIBC 241 10/02/2020  ? UIBC 211 10/02/2020  ? IRONPCTSAT 13 (L) 10/02/2020  ? ?Lab Results  ?Component Value Date  ? RBC 4.01 04/30/2021  ? ?No results found for: KPAFRELGTCHN, LAMBDASER, KAPLAMBRATIO ?No results found for: IGGSERUM, IGA, IGMSERUM ?No results found for: TOTALPROTELP, ALBUMINELP, A1GS, A2GS, BETS, BETA2SER, GAMS, MSPIKE, SPEI ?  Chemistry   ?   ?Component Value Date/Time  ? NA 134 (L) 04/30/2021 1301  ? K 3.8 04/30/2021 1301  ? CL 103 04/30/2021 1301  ? CO2 25 04/30/2021 1301  ? BUN 15 04/30/2021 1301  ? CREATININE 0.60 04/30/2021 1301  ?    ?Component Value Date/Time  ? CALCIUM 10.2  04/30/2021 1301  ? ALKPHOS 231 (H) 04/30/2021 1301  ? AST 75 (H) 04/30/2021 1301  ? ALT 79 (H) 04/30/2021 1301  ? BILITOT 0.9 04/30/2021 1301  ?  ? ? ? ?Impression and Plan: [name] [name] is a very pleasant 76 yo female from [place] with hepatocellular carcinoma and Hepatitis C.  ? ?Again, she is responded very nicely to treatment.  I am just very happy for her.  I am glad that her liver tests are actually better. ? ?I still am going to  hold on the Avastin for right now. ? ?We will have her come back in a couple weeks for her next cycle of treatment.  After that, then we will plan for her scans to be done.   ? ?Volanda Napoleon, MD ?3/16/20232:03 PM ? ?

## 2021-04-30 NOTE — Patient Instructions (Signed)
[facility]  Discharge Instructions: Thank you for choosing [facility] to provide your oncology and hematology care.   If you have a lab appointment with the Cancer Center, please go directly to the Cancer Center and check in at the registration area.  Wear comfortable clothing and clothing appropriate for easy access to any Portacath or PICC line.   We strive to give you quality time with your provider. You may need to reschedule your appointment if you arrive late (15 or more minutes).  Arriving late affects you and other patients whose appointments are after yours.  Also, if you miss three or more appointments without notifying the office, you may be dismissed from the clinic at the provider's discretion.      For prescription refill requests, have your pharmacy contact our office and allow 72 hours for refills to be completed.    Today you received the following chemotherapy and/or immunotherapy agents:  Opdivo      To help prevent nausea and vomiting after your treatment, we encourage you to take your nausea medication as directed.  BELOW ARE SYMPTOMS THAT SHOULD BE REPORTED IMMEDIATELY: *FEVER GREATER THAN 100.4 F (38 C) OR HIGHER *CHILLS OR SWEATING *NAUSEA AND VOMITING THAT IS NOT CONTROLLED WITH YOUR NAUSEA MEDICATION *UNUSUAL SHORTNESS OF BREATH *UNUSUAL BRUISING OR BLEEDING *URINARY PROBLEMS (pain or burning when urinating, or frequent urination) *BOWEL PROBLEMS (unusual diarrhea, constipation, pain near the anus) TENDERNESS IN MOUTH AND THROAT WITH OR WITHOUT PRESENCE OF ULCERS (sore throat, sores in mouth, or a toothache) UNUSUAL RASH, SWELLING OR PAIN  UNUSUAL VAGINAL DISCHARGE OR ITCHING   Items with * indicate a potential emergency and should be followed up as soon as possible or go to the Emergency Department if any problems should occur.  Please show the CHEMOTHERAPY ALERT CARD or IMMUNOTHERAPY ALERT CARD at check-in to the  Emergency Department and triage nurse. Should you have questions after your visit or need to cancel or reschedule your appointment, please contact [facility]  [phone] and follow the prompts.  Office hours are 8:00 a.m. to 4:30 p.m. Monday - Friday. Please note that voicemails left after 4:00 p.m. may not be returned until the following business day.  We are closed weekends and major holidays. You have access to a nurse at all times for urgent questions. Please call the main number to the clinic [phone] and follow the prompts.  For any non-urgent questions, you may also contact your provider using MyChart. We now offer e-Visits for anyone 18 and older to request care online for non-urgent symptoms. For details visit [email or web address].   Also download the MyChart app! Go to the app store, search "MyChart", open the app, select [facility], and log in with your MyChart username and password.  Due to Covid, a mask is required upon entering the hospital/clinic. If you do not have a mask, one will be given to you upon arrival. For doctor visits, patients may have 1 support person aged 18 or older with them. For treatment visits, patients cannot have anyone with them due to current Covid guidelines and our immunocompromised population.  

## 2021-04-30 NOTE — Telephone Encounter (Signed)
Per 04/30/21 los ?

## 2021-04-30 NOTE — Patient Instructions (Signed)

## 2021-05-01 LAB — AFP TUMOR MARKER: AFP, Serum, Tumor Marker: 15.5 ng/mL — ABNORMAL HIGH (ref 0.0–9.2)

## 2021-05-03 ENCOUNTER — Encounter: Payer: Self-pay | Admitting: Gastroenterology

## 2021-05-04 ENCOUNTER — Ambulatory Visit: Payer: No Typology Code available for payment source | Attending: Internal Medicine

## 2021-05-04 ENCOUNTER — Other Ambulatory Visit: Payer: Self-pay

## 2021-05-06 ENCOUNTER — Encounter: Payer: Self-pay | Admitting: Gastroenterology

## 2021-05-06 ENCOUNTER — Encounter: Payer: Self-pay | Admitting: Hematology & Oncology

## 2021-05-06 ENCOUNTER — Ambulatory Visit (INDEPENDENT_AMBULATORY_CARE_PROVIDER_SITE_OTHER): Payer: Self-pay | Admitting: Gastroenterology

## 2021-05-06 VITALS — BP 140/74 | HR 82 | Ht 64.0 in | Wt 147.0 lb

## 2021-05-06 DIAGNOSIS — Z1211 Encounter for screening for malignant neoplasm of colon: Secondary | ICD-10-CM

## 2021-05-06 DIAGNOSIS — K5909 Other constipation: Secondary | ICD-10-CM

## 2021-05-06 NOTE — Patient Instructions (Addendum)
It was my pleasure to provide care to you today. Based on our discussion, I am providing you with my recommendations below: ? ?RECOMMENDATION(S):  ? ?COLONOSCOPY:  ? ?You have been scheduled for a colonoscopy. Please follow written instructions given to you at your visit today.  ? ?PREP:  ? ?Please pick up your prep supplies at the pharmacy within the next 1-3 days. ? ?INHALERS:  ? ?If you use inhalers (even only as needed), please bring them with you on the day of your procedure. ? ?COLONOSCOPY TIPS: ? ?To reduce nausea and dehydration, stay well hydrated for 3-4 days prior to the exam.  ?To prevent skin/hemorrhoid irritation - prior to wiping, put A&Dointment or vaseline on the toilet paper. ?Keep a towel or pad on the bed.  ?BEFORE STARTING YOUR PREP, drink  64oz of clear liquids in the morning. This will help to flush the colon and will ensure you are well hydrated!!!!  ?NOTE - This is in addition to the fluids required for to complete your prep. ?Use of a flavored hard candy, such as grape Anise Salvo, can counteract some of the flavor of the prep and may prevent some nausea.  ? ? ?FOLLOW UP: ? ?After your procedure, you will receive a call from my office staff regarding my recommendation for follow up. ? ?BMI: ? ?If you are age 23 or older, your body mass index should be between 23-30. Your Body mass index is 25.23 kg/m?[name] If this is out of the aforementioned range listed, please consider follow up with your Primary Care Provider. ? ?If you are age 8 or younger, your body mass index should be between 19-25. Your Body mass index is 25.23 kg/m?[name] If this is out of the aformentioned range listed, please consider follow up with your Primary Care Provider.  ? ?MY CHART: ? ?The [facility] providers would like to encourage you to use [facility] to communicate with providers for non-urgent requests or questions.  Due to long hold times on the telephone, sending your provider a message by [facility] may be a faster and more  efficient way to get a response.  Please allow 48 business hours for a response.  Please remember that this is for non-urgent requests.  ? ?Thank you for trusting me with your gastrointestinal care!   ? ?Thornton Park, MD, MPH ? ?

## 2021-05-06 NOTE — Progress Notes (Signed)
? ?Referring Provider: Ladell Pier, MD ?Primary Care Physician:  [name] Pier, MD ? ?Chief Complaint:  Poor prep ? ? ?IMPRESSION:  ?Perforated ulcer repair: H pylori negative. No follow-up needed at this time.  ? ?Personal history of colon cancer: No prior surveillance since resection in 2004 until incomplete colonoscopy 04/23/21. She remains very motivated to proceed with colonoscopy now and thinks she knows about diet changes prior to the proceed that will lead to improved success.  ? ?Chronic constipation: Better since starting immunotherapy.  ? ?PLAN: ?Colonoscopy with 2 day bowel prep ? ? ? ?HPI: [name] [name] is a 76 y.o. female who returns in follow-up after endoscopic evaluation 04/23/21. Her daughter accompanies her to this appointment. 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 Diagnosed with colon cancer while in [place] in 2004 treated with surgery and chemotherapy. She is currently receiving immunotherapy for her [place]. Treatment held once due to elevated LFTs.  ? ?Perforated gastric ulcer s/p lap omental Graham patch 08/09/2020.  Noted to have liver masses intraoperatively which were biopsied and confirmed to be hepatocellular carcinoma.  She was also found to be positive for HCV.  Her postoperative course was uneventful. Referred by surgery for an EGD to follow-up on her ulcer repair.  ? ?EGD 04/23/21 showed gastritis but was otherwise normal. Biopsies were negative for H pylori. ? ?Screening colonoscopy requested by the patient and her daughter was performed at the same time of EGD was limited due to retained stool. The exam showed pancolonic diverticulosis, colonic anastomosis, and a moderate amount of stool.  ? ?The poor prep occurred in the setting of chronic constipation despite a two day bowel prep.  ? ?She had a couple of days of loose  stools.  ? ?There is no known family history of colon cancer or polyps. No family history of stomach cancer or other GI malignancy. No family history of inflammatory bowel disease or celiac.  ? ? ?Past Medical History:  ?Diagnosis Date  ? Colon cancer ([place])   ? Diabetes mellitus without complication ([place])   ? Glaucoma   ? Goals of care, counseling/discussion 09/01/2020  ? Hepatitis C   ? Hepatocellular carcinoma ([place])   ? Hypertension   ? ? ?Past Surgical History:  ?Procedure Laterality Date  ? COLON RESECTION  2005  ? In [place] - ?colon cancer  ? IR IMAGING GUIDED PORT INSERTION  09/18/2020  ? LAPAROSCOPIC GASTRIC RESECTION N/A 08/09/2020  ? Procedure: LAPAROSCOPIC EXPLORATION OF ABDOMEN, PERFORATED ULCER REPAIR, LIVER BIOPSY;  Surgeon: Michael Boston, MD;  Location: [facility];  Service: General;  Laterality: N/A;  ? ? ?Current Outpatient Medications  ?Medication Sig Dispense Refill  ? brimonidine (ALPHAGAN) 0.2 % ophthalmic solution 1 drop 2 (two) times daily.    ? glucose blood (TRUE METRIX BLOOD GLUCOSE TEST) test strip Use as instructed 100 each 12  ? insulin NPH-regular Human (HUMULIN 70/30) (70-30) 100 UNIT/ML injection Inject 20 units into the skin every morning and 5 units every afternoon with meals 10 mL 0  ? losartan (COZAAR) 50 MG tablet Take 1 tablet (50 mg total) by mouth at bedtime. 30 tablet 6  ? NIFEdipine (PROCARDIA-XL/NIFEDICAL-XL) 30 MG 24 hr tablet Take 1 tablet (30 mg total) by mouth in the morning and at bedtime. 60 tablet 0  ? pantoprazole (PROTONIX) 40 MG tablet Take 1 tablet (40 mg total) by mouth 2 (  two) times daily. 60 tablet 3  ? polyethylene glycol powder (GLYCOLAX/MIRALAX) 17 GM/SCOOP powder Take 17 g by mouth daily as needed.    ? ?No current facility-administered medications for this visit.  ? ?Facility-Administered Medications Ordered in Other Visits  ?Medication Dose Route Frequency Provider Last Rate Last Admin  ? sodium chloride flush (NS) 0.9 % injection 10 mL  10 mL Intravenous PRN  Celso Amy, NP   10 mL at 12/18/20 1009  ? ? ?Allergies as of 05/06/2021 - Review Complete 05/06/2021  ?Allergen Reaction Noted  ? Amoxicillin Swelling and Rash 09/01/2020  ? Bactrim [sulfamethoxazole-trimethoprim] Swelling 08/08/2020  ? Clarithromycin Swelling and Rash 09/01/2020  ? ? ?Family History  ?Problem Relation Age of Onset  ? Cancer Father   ?     type unknown, was on the leg  ? Other Daughter   ?     Acoustic neuroma  ? Breast cancer Neg Hx   ? Colon cancer Neg Hx   ? Esophageal cancer Neg Hx   ? Rectal cancer Neg Hx   ? Stomach cancer Neg Hx   ? ? ?Physical Exam: ?General:   Alert,  well-nourished, pleasant and cooperative in NAD ?Head:  Normocephalic and atraumatic. ?Eyes:  Sclera clear, no icterus.   Conjunctiva pink. ?Abdomen:  Soft, nontender, nondistended, normal bowel sounds, no rebound or guarding. No hepatosplenomegaly.   ?Neurologic:  Alert and  oriented x4;  grossly nonfocal ?Skin:  Intact without significant lesions or rashes. ?Psych:  Alert and cooperative. Normal mood and affect. ? ? ? ?[name], MD, MPH ?05/06/2021, 3:47 PM ? ? ? ?  ?

## 2021-05-14 ENCOUNTER — Other Ambulatory Visit (INDEPENDENT_AMBULATORY_CARE_PROVIDER_SITE_OTHER): Payer: Self-pay | Admitting: Internal Medicine

## 2021-05-14 ENCOUNTER — Inpatient Hospital Stay: Payer: No Typology Code available for payment source

## 2021-05-14 ENCOUNTER — Encounter: Payer: Self-pay | Admitting: Hematology & Oncology

## 2021-05-14 ENCOUNTER — Telehealth: Payer: Self-pay | Admitting: *Deleted

## 2021-05-14 ENCOUNTER — Other Ambulatory Visit: Payer: Self-pay

## 2021-05-14 ENCOUNTER — Telehealth: Payer: Self-pay

## 2021-05-14 ENCOUNTER — Inpatient Hospital Stay (HOSPITAL_BASED_OUTPATIENT_CLINIC_OR_DEPARTMENT_OTHER): Payer: No Typology Code available for payment source | Admitting: Hematology & Oncology

## 2021-05-14 VITALS — BP 148/73 | HR 75 | Temp 98.0°F | Resp 18 | Ht 64.0 in | Wt 147.0 lb

## 2021-05-14 DIAGNOSIS — C220 Liver cell carcinoma: Secondary | ICD-10-CM

## 2021-05-14 LAB — CMP (CANCER CENTER ONLY)
ALT: 88 U/L — ABNORMAL HIGH (ref 0–44)
AST: 82 U/L — ABNORMAL HIGH (ref 15–41)
Albumin: 4.1 g/dL (ref 3.5–5.0)
Alkaline Phosphatase: 260 U/L — ABNORMAL HIGH (ref 38–126)
Anion gap: 7 (ref 5–15)
BUN: 13 mg/dL (ref 8–23)
CO2: 26 mmol/L (ref 22–32)
Calcium: 10.9 mg/dL — ABNORMAL HIGH (ref 8.9–10.3)
Chloride: 103 mmol/L (ref 98–111)
Creatinine: 0.69 mg/dL (ref 0.44–1.00)
GFR, Estimated: >60 mL/min (ref >60)
Glucose, Bld: 124 mg/dL — ABNORMAL HIGH (ref 70–99)
Potassium: 3.9 mmol/L (ref 3.5–5.1)
Sodium: 136 mmol/L (ref 135–145)
Total Bilirubin: 0.8 mg/dL (ref 0.3–1.2)
Total Protein: 8.2 g/dL — ABNORMAL HIGH (ref 6.5–8.1)

## 2021-05-14 LAB — CBC WITH DIFFERENTIAL (CANCER CENTER ONLY)
Abs Immature Granulocytes: 0 10*3/uL (ref 0.00–0.07)
Basophils Absolute: 0 10*3/uL (ref 0.0–0.1)
Basophils Relative: 1 %
Eosinophils Absolute: 0 10*3/uL (ref 0.0–0.5)
Eosinophils Relative: 1 %
HCT: 39.5 % (ref 36.0–46.0)
Hemoglobin: 13.4 g/dL (ref 12.0–15.0)
Immature Granulocytes: 0 %
Lymphocytes Relative: 40 %
Lymphs Abs: 1.5 10*3/uL (ref 0.7–4.0)
MCH: 31.4 pg (ref 26.0–34.0)
MCHC: 33.9 g/dL (ref 30.0–36.0)
MCV: 92.5 fL (ref 80.0–100.0)
Monocytes Absolute: 0.2 10*3/uL (ref 0.1–1.0)
Monocytes Relative: 6 %
Neutro Abs: 2 10*3/uL (ref 1.7–7.7)
Neutrophils Relative %: 52 %
Platelet Count: 177 10*3/uL (ref 150–400)
RBC: 4.27 MIL/uL (ref 3.87–5.11)
RDW: 14.8 % (ref 11.5–15.5)
WBC Count: 3.7 10*3/uL — ABNORMAL LOW (ref 4.0–10.5)
nRBC: 0 % (ref 0.0–0.2)

## 2021-05-14 LAB — LACTATE DEHYDROGENASE: LDH: 169 U/L (ref 98–192)

## 2021-05-14 MED ORDER — SODIUM CHLORIDE 0.9% FLUSH
10.0000 mL | INTRAVENOUS | Status: DC | PRN
  Administered 2021-05-14: 10 mL

## 2021-05-14 MED ORDER — NIFEDIPINE ER OSMOTIC RELEASE 30 MG PO TB24
30.0000 mg | ORAL_TABLET | Freq: Two times a day (BID) | ORAL | 0 refills | Status: DC
  Filled 2021-05-14: qty 60, 30d supply, fill #0

## 2021-05-14 MED ORDER — SODIUM CHLORIDE 0.9 % IV SOLN
240.0000 mg | Freq: Once | INTRAVENOUS | Status: AC
  Administered 2021-05-14: 240 mg via INTRAVENOUS
  Filled 2021-05-14: qty 24

## 2021-05-14 MED ORDER — SODIUM CHLORIDE 0.9 % IV SOLN: Freq: Once | INTRAVENOUS | Status: AC

## 2021-05-14 MED ORDER — HEPARIN SOD (PORK) LOCK FLUSH 100 UNIT/ML IV SOLN
500.0000 [IU] | Freq: Once | INTRAVENOUS | Status: AC | PRN
  Administered 2021-05-14: 500 [IU]

## 2021-05-14 NOTE — Patient Instructions (Signed)

## 2021-05-14 NOTE — Progress Notes (Signed)
Reviewed pt labs with Dr. Marin Olp and pt ok to treat with today's AST and ALT results. ?

## 2021-05-14 NOTE — Telephone Encounter (Signed)
The [place] patient assistance form completed and faxed to [phone] for pt's Opdivo. dph ?

## 2021-05-14 NOTE — Patient Instructions (Signed)
[facility]  Discharge Instructions: Thank you for choosing [facility] to provide your oncology and hematology care.   If you have a lab appointment with the Cancer Center, please go directly to the Cancer Center and check in at the registration area.  Wear comfortable clothing and clothing appropriate for easy access to any Portacath or PICC line.   We strive to give you quality time with your provider. You may need to reschedule your appointment if you arrive late (15 or more minutes).  Arriving late affects you and other patients whose appointments are after yours.  Also, if you miss three or more appointments without notifying the office, you may be dismissed from the clinic at the provider's discretion.      For prescription refill requests, have your pharmacy contact our office and allow 72 hours for refills to be completed.    Today you received the following chemotherapy and/or immunotherapy agents:  Opdivo      To help prevent nausea and vomiting after your treatment, we encourage you to take your nausea medication as directed.  BELOW ARE SYMPTOMS THAT SHOULD BE REPORTED IMMEDIATELY: *FEVER GREATER THAN 100.4 F (38 C) OR HIGHER *CHILLS OR SWEATING *NAUSEA AND VOMITING THAT IS NOT CONTROLLED WITH YOUR NAUSEA MEDICATION *UNUSUAL SHORTNESS OF BREATH *UNUSUAL BRUISING OR BLEEDING *URINARY PROBLEMS (pain or burning when urinating, or frequent urination) *BOWEL PROBLEMS (unusual diarrhea, constipation, pain near the anus) TENDERNESS IN MOUTH AND THROAT WITH OR WITHOUT PRESENCE OF ULCERS (sore throat, sores in mouth, or a toothache) UNUSUAL RASH, SWELLING OR PAIN  UNUSUAL VAGINAL DISCHARGE OR ITCHING   Items with * indicate a potential emergency and should be followed up as soon as possible or go to the Emergency Department if any problems should occur.  Please show the CHEMOTHERAPY ALERT CARD or IMMUNOTHERAPY ALERT CARD at check-in to the  Emergency Department and triage nurse. Should you have questions after your visit or need to cancel or reschedule your appointment, please contact [facility]  [phone] and follow the prompts.  Office hours are 8:00 a.m. to 4:30 p.m. Monday - Friday. Please note that voicemails left after 4:00 p.m. may not be returned until the following business day.  We are closed weekends and major holidays. You have access to a nurse at all times for urgent questions. Please call the main number to the clinic [phone] and follow the prompts.  For any non-urgent questions, you may also contact your provider using MyChart. We now offer e-Visits for anyone 18 and older to request care online for non-urgent symptoms. For details visit [email or web address].   Also download the MyChart app! Go to the app store, search "MyChart", open the app, select [facility], and log in with your MyChart username and password.  Due to Covid, a mask is required upon entering the hospital/clinic. If you do not have a mask, one will be given to you upon arrival. For doctor visits, patients may have 1 support person aged 18 or older with them. For treatment visits, patients cannot have anyone with them due to current Covid guidelines and our immunocompromised population.  

## 2021-05-14 NOTE — Telephone Encounter (Signed)
Per 05/14/21 los - gave upcoming appointments - confirmed ?

## 2021-05-14 NOTE — Progress Notes (Signed)
?Hematology and Oncology Follow Up Visit ? ?[name] [name] [place] ?[id number] ?[birth year] 76 y.o. ?05/14/2021 ? ? ?Principle Diagnosis:  ?Hepatocellular carcinoma-multifocal ?Hepatitis C ?  ?Current Therapy:        ?Status post cycle 1 of nivolumab/ipilimumab -- d/c on 10/13/2020 due to hepatic toxicity ?Nivolumab 240 mg IV every 2 weeks - s/p cycle #10 -- started 11/21/2020 ?  ?Interim History:  [name] [name] is here today for follow-up and treatment.  She is doing quite well.  She has tolerated treatment nicely.  Her last alpha-fetoprotein was stable 15.5. ? ?Overall, she is doing quite nicely.  Her liver function studies are holding pretty steady. ? ?She has had no problems with nausea or vomiting.  She did have a bout of acute upper abdominal pain.  This was spontaneously resolved. ? ?There is been no bleeding.  She has had no change in bowel or bladder habits.  There is been no melena or bright red blood per rectum.  She has had no leg swelling.  There is been no fever.  She has had no cough or shortness of breath. ? ?Overall, I would say that her performance status is ECOG 1.  ? ?Medications:  ?Allergies as of 05/14/2021   ? ?   Reactions  ? Amoxicillin Swelling, Rash  ? Daughter is unsure if patient is allergic to Amoxicillin or Clarithromycin.  ? Bactrim [sulfamethoxazole-trimethoprim] Swelling  ? Clarithromycin Swelling, Rash  ? Daughter is unsure if Clarithromycin or Amoxicillin caused rash and swelling.  ? ?  ? ?  ?Medication List  ?  ? ?  ? Accurate as of May 14, 2021 12:58 PM. If you have any questions, ask your nurse or doctor.  ?  ?  ? ?  ? ?brimonidine 0.2 % ophthalmic solution ?Commonly known as: ALPHAGAN ?1 drop 2 (two) times daily. ?  ?HumuLIN 70/30 (70-30) 100 UNIT/ML injection ?Generic drug: insulin NPH-regular Human ?Inject 20 units into the skin every morning and 5 units every afternoon with meals ?  ?losartan 50 MG tablet ?Commonly known as: COZAAR ?Take 1 tablet (50 mg total) by  mouth at bedtime. ?  ?NIFEdipine 30 MG 24 hr tablet ?Commonly known as: PROCARDIA-XL/NIFEDICAL-XL ?Take 1 tablet (30 mg total) by mouth in the morning and at bedtime. ?  ?pantoprazole 40 MG tablet ?Commonly known as: Protonix ?Take 1 tablet (40 mg total) by mouth 2 (two) times daily. ?  ?polyethylene glycol powder 17 GM/SCOOP powder ?Commonly known as: GLYCOLAX/MIRALAX ?Take 17 g by mouth daily as needed. ?  ?True Metrix Blood Glucose Test test strip ?Generic drug: glucose blood ?Use as instructed ?  ? ?  ? ? ?Allergies:  ?Allergies  ?Allergen Reactions  ? Amoxicillin Swelling and Rash  ?  Daughter is unsure if patient is allergic to Amoxicillin or Clarithromycin.  ? Bactrim [Sulfamethoxazole-Trimethoprim] Swelling  ? Clarithromycin Swelling and Rash  ?  Daughter is unsure if Clarithromycin or Amoxicillin caused rash and swelling.  ? ? ?Past Medical History, Surgical history, Social history, and Family History were reviewed and updated. ? ?Review of Systems: ?Review of Systems  ?Constitutional: Negative.   ?HENT: Negative.    ?Eyes: Negative.   ?Respiratory: Negative.    ?Cardiovascular: Negative.   ?Gastrointestinal: Negative.   ?Genitourinary: Negative.   ?Musculoskeletal: Negative.   ?Skin: Negative.   ?Neurological:  Positive for tingling.  ?Endo/Heme/Allergies: Negative.   ?Psychiatric/Behavioral: Negative.    ? ? ?Physical Exam: ? height is '5\' 4"'$  (1.626 m) and weight is  147 lb (66.7 kg). Her oral temperature is 98 ?F (36.7 ?C). Her blood pressure is 148/73 (abnormal) and her pulse is 75. Her respiration is 18 and oxygen saturation is 100%.  ? ?Wt Readings from Last 3 Encounters:  ?05/14/21 147 lb (66.7 kg)  ?05/06/21 147 lb (66.7 kg)  ?04/30/21 143 lb (64.9 kg)  ? ? ?Physical Exam ?Vitals reviewed.  ?HENT:  ?   Head: Normocephalic and atraumatic.  ?Eyes:  ?   Pupils: Pupils are equal, round, and reactive to light.  ?Cardiovascular:  ?   Rate and Rhythm: Normal rate and regular rhythm.  ?   Heart sounds: Normal  heart sounds.  ?Pulmonary:  ?   Effort: Pulmonary effort is normal.  ?   Breath sounds: Normal breath sounds.  ?Abdominal:  ?   General: Bowel sounds are normal.  ?   Palpations: Abdomen is soft.  ?   Comments: Abdominal exam shows a well-healed laparotomy scar.  She has no fluid wave.  There is no guarding or rebound tenderness to palpation.  There is no palpable liver or spleen tip.  ?Musculoskeletal:     ?   General: No tenderness or deformity. Normal range of motion.  ?   Cervical back: Normal range of motion.  ?Lymphadenopathy:  ?   Cervical: No cervical adenopathy.  ?Skin: ?   General: Skin is warm and dry.  ?   Findings: No erythema or rash.  ?Neurological:  ?   Mental Status: She is alert and oriented to person, place, and time.  ?Psychiatric:     ?   Behavior: Behavior normal.     ?   Thought Content: Thought content normal.     ?   Judgment: Judgment normal.  ? ?  ? ?Lab Results  ?Component Value Date  ? WBC 3.7 (L) 05/14/2021  ? HGB 13.4 05/14/2021  ? HCT 39.5 05/14/2021  ? MCV 92.5 05/14/2021  ? PLT 177 05/14/2021  ? ?Lab Results  ?Component Value Date  ? FERRITIN 603 (H) 10/02/2020  ? IRON 31 (L) 10/02/2020  ? TIBC 241 10/02/2020  ? UIBC 211 10/02/2020  ? IRONPCTSAT 13 (L) 10/02/2020  ? ?Lab Results  ?Component Value Date  ? RBC 4.27 05/14/2021  ? ?No results found for: KPAFRELGTCHN, LAMBDASER, KAPLAMBRATIO ?No results found for: IGGSERUM, IGA, IGMSERUM ?No results found for: TOTALPROTELP, ALBUMINELP, A1GS, A2GS, BETS, BETA2SER, GAMS, MSPIKE, SPEI ?  Chemistry   ?   ?Component Value Date/Time  ? NA 134 (L) 04/30/2021 1301  ? K 3.8 04/30/2021 1301  ? CL 103 04/30/2021 1301  ? CO2 25 04/30/2021 1301  ? BUN 15 04/30/2021 1301  ? CREATININE 0.60 04/30/2021 1301  ?    ?Component Value Date/Time  ? CALCIUM 10.2 04/30/2021 1301  ? ALKPHOS 231 (H) 04/30/2021 1301  ? AST 75 (H) 04/30/2021 1301  ? ALT 79 (H) 04/30/2021 1301  ? BILITOT 0.9 04/30/2021 1301  ?  ? ? ? ?Impression and Plan: [name] [name] is  a very pleasant 76 yo female from [place] with hepatocellular carcinoma and Hepatitis C.  ? ?We will set her up with an MRI after this treatment.  Her last MRI was done back in January. ? ?I would like to hope that her disease is holding steady.  If we do find any progression, then I think we will add Avastin to the nivolumab.  Is been over a year since she had her bowel perforation from the gastric ulcer.  She has had her upper endoscopy which looked fine with everything healed. ? ?Her quality life is still fantastic. ? ?I think she and her daughter are still planning on going over to a [place] I think in the fall or holiday season.  Hopefully, she will be able to make it over there without any problems. ? ? ?Volanda Napoleon, MD ?3/30/202312:58 PM ? ?

## 2021-05-15 ENCOUNTER — Other Ambulatory Visit: Payer: Self-pay

## 2021-05-15 ENCOUNTER — Encounter: Payer: Self-pay | Admitting: Hematology & Oncology

## 2021-05-15 LAB — AFP TUMOR MARKER: AFP, Serum, Tumor Marker: 17.1 ng/mL — ABNORMAL HIGH (ref 0.0–9.2)

## 2021-05-19 ENCOUNTER — Other Ambulatory Visit: Payer: Self-pay

## 2021-05-25 ENCOUNTER — Telehealth: Payer: Self-pay | Admitting: *Deleted

## 2021-05-25 NOTE — Telephone Encounter (Signed)
Patients daughter called [place] to make sure it was ok if MRI was done on Saturday after patients imunotherapy appointment.  She was under the impression that it needed to be done before her immunotherapy.  This is ok to leave as scheduled per dr Marin Olp ?

## 2021-05-27 ENCOUNTER — Other Ambulatory Visit: Payer: Self-pay | Admitting: Internal Medicine

## 2021-05-27 ENCOUNTER — Encounter: Payer: Self-pay | Admitting: Hematology & Oncology

## 2021-05-27 ENCOUNTER — Other Ambulatory Visit: Payer: Self-pay

## 2021-05-27 MED ORDER — HUMULIN 70/30 (70-30) 100 UNIT/ML ~~LOC~~ SUSP
SUBCUTANEOUS | 0 refills | Status: DC
  Filled 2021-05-27: qty 10, 28d supply, fill #0

## 2021-05-28 ENCOUNTER — Inpatient Hospital Stay: Payer: Self-pay | Attending: Hematology & Oncology

## 2021-05-28 ENCOUNTER — Inpatient Hospital Stay: Payer: Self-pay

## 2021-05-28 ENCOUNTER — Inpatient Hospital Stay (HOSPITAL_BASED_OUTPATIENT_CLINIC_OR_DEPARTMENT_OTHER): Payer: Self-pay | Admitting: Hematology & Oncology

## 2021-05-28 ENCOUNTER — Other Ambulatory Visit: Payer: Self-pay

## 2021-05-28 ENCOUNTER — Encounter: Payer: Self-pay | Admitting: Hematology & Oncology

## 2021-05-28 VITALS — BP 143/63 | HR 82 | Temp 99.1°F | Resp 17 | Wt 146.0 lb

## 2021-05-28 DIAGNOSIS — B1920 Unspecified viral hepatitis C without hepatic coma: Secondary | ICD-10-CM | POA: Insufficient documentation

## 2021-05-28 DIAGNOSIS — Z5112 Encounter for antineoplastic immunotherapy: Secondary | ICD-10-CM | POA: Insufficient documentation

## 2021-05-28 DIAGNOSIS — Z79899 Other long term (current) drug therapy: Secondary | ICD-10-CM | POA: Insufficient documentation

## 2021-05-28 DIAGNOSIS — C220 Liver cell carcinoma: Secondary | ICD-10-CM

## 2021-05-28 LAB — CMP (CANCER CENTER ONLY)
ALT: 70 U/L — ABNORMAL HIGH (ref 0–44)
AST: 64 U/L — ABNORMAL HIGH (ref 15–41)
Albumin: 3.7 g/dL (ref 3.5–5.0)
Alkaline Phosphatase: 228 U/L — ABNORMAL HIGH (ref 38–126)
Anion gap: 6 (ref 5–15)
BUN: 17 mg/dL (ref 8–23)
CO2: 29 mmol/L (ref 22–32)
Calcium: 10.6 mg/dL — ABNORMAL HIGH (ref 8.9–10.3)
Chloride: 100 mmol/L (ref 98–111)
Creatinine: 0.64 mg/dL (ref 0.44–1.00)
GFR, Estimated: >60 mL/min (ref >60)
Glucose, Bld: 66 mg/dL — ABNORMAL LOW (ref 70–99)
Potassium: 3.4 mmol/L — ABNORMAL LOW (ref 3.5–5.1)
Sodium: 135 mmol/L (ref 135–145)
Total Bilirubin: 1 mg/dL (ref 0.3–1.2)
Total Protein: 7.4 g/dL (ref 6.5–8.1)

## 2021-05-28 LAB — CBC WITH DIFFERENTIAL (CANCER CENTER ONLY)
Abs Immature Granulocytes: 0 10*3/uL (ref 0.00–0.07)
Basophils Absolute: 0 10*3/uL (ref 0.0–0.1)
Basophils Relative: 0 %
Eosinophils Absolute: 0.1 10*3/uL (ref 0.0–0.5)
Eosinophils Relative: 1 %
HCT: 37.3 % (ref 36.0–46.0)
Hemoglobin: 12.9 g/dL (ref 12.0–15.0)
Immature Granulocytes: 0 %
Lymphocytes Relative: 51 %
Lymphs Abs: 2.5 10*3/uL (ref 0.7–4.0)
MCH: 31.3 pg (ref 26.0–34.0)
MCHC: 34.6 g/dL (ref 30.0–36.0)
MCV: 90.5 fL (ref 80.0–100.0)
Monocytes Absolute: 0.4 10*3/uL (ref 0.1–1.0)
Monocytes Relative: 8 %
Neutro Abs: 2 10*3/uL (ref 1.7–7.7)
Neutrophils Relative %: 40 %
Platelet Count: 166 10*3/uL (ref 150–400)
RBC: 4.12 MIL/uL (ref 3.87–5.11)
RDW: 14.4 % (ref 11.5–15.5)
WBC Count: 4.9 10*3/uL (ref 4.0–10.5)
nRBC: 0 % (ref 0.0–0.2)

## 2021-05-28 LAB — LACTATE DEHYDROGENASE: LDH: 159 U/L (ref 98–192)

## 2021-05-28 MED ORDER — SODIUM CHLORIDE 0.9 % IV SOLN: Freq: Once | INTRAVENOUS | Status: AC

## 2021-05-28 MED ORDER — HEPARIN SOD (PORK) LOCK FLUSH 100 UNIT/ML IV SOLN
500.0000 [IU] | Freq: Once | INTRAVENOUS | Status: AC | PRN
  Administered 2021-05-28: 500 [IU]

## 2021-05-28 MED ORDER — SODIUM CHLORIDE 0.9 % IV SOLN
240.0000 mg | Freq: Once | INTRAVENOUS | Status: AC
  Administered 2021-05-28: 240 mg via INTRAVENOUS
  Filled 2021-05-28: qty 24

## 2021-05-28 MED ORDER — SODIUM CHLORIDE 0.9% FLUSH
10.0000 mL | INTRAVENOUS | Status: DC | PRN
  Administered 2021-05-28: 10 mL

## 2021-05-28 NOTE — Patient Instructions (Signed)
[facility]  Discharge Instructions: Thank you for choosing [facility] to provide your oncology and hematology care.   If you have a lab appointment with the Cancer Center, please go directly to the Cancer Center and check in at the registration area.  Wear comfortable clothing and clothing appropriate for easy access to any Portacath or PICC line.   We strive to give you quality time with your provider. You may need to reschedule your appointment if you arrive late (15 or more minutes).  Arriving late affects you and other patients whose appointments are after yours.  Also, if you miss three or more appointments without notifying the office, you may be dismissed from the clinic at the provider's discretion.      For prescription refill requests, have your pharmacy contact our office and allow 72 hours for refills to be completed.    Today you received the following chemotherapy and/or immunotherapy agents:  Opdivo      To help prevent nausea and vomiting after your treatment, we encourage you to take your nausea medication as directed.  BELOW ARE SYMPTOMS THAT SHOULD BE REPORTED IMMEDIATELY: *FEVER GREATER THAN 100.4 F (38 C) OR HIGHER *CHILLS OR SWEATING *NAUSEA AND VOMITING THAT IS NOT CONTROLLED WITH YOUR NAUSEA MEDICATION *UNUSUAL SHORTNESS OF BREATH *UNUSUAL BRUISING OR BLEEDING *URINARY PROBLEMS (pain or burning when urinating, or frequent urination) *BOWEL PROBLEMS (unusual diarrhea, constipation, pain near the anus) TENDERNESS IN MOUTH AND THROAT WITH OR WITHOUT PRESENCE OF ULCERS (sore throat, sores in mouth, or a toothache) UNUSUAL RASH, SWELLING OR PAIN  UNUSUAL VAGINAL DISCHARGE OR ITCHING   Items with * indicate a potential emergency and should be followed up as soon as possible or go to the Emergency Department if any problems should occur.  Please show the CHEMOTHERAPY ALERT CARD or IMMUNOTHERAPY ALERT CARD at check-in to the  Emergency Department and triage nurse. Should you have questions after your visit or need to cancel or reschedule your appointment, please contact [facility]  [phone] and follow the prompts.  Office hours are 8:00 a.m. to 4:30 p.m. Monday - Friday. Please note that voicemails left after 4:00 p.m. may not be returned until the following business day.  We are closed weekends and major holidays. You have access to a nurse at all times for urgent questions. Please call the main number to the clinic [phone] and follow the prompts.  For any non-urgent questions, you may also contact your provider using MyChart. We now offer e-Visits for anyone 18 and older to request care online for non-urgent symptoms. For details visit [email or web address].   Also download the MyChart app! Go to the app store, search "MyChart", open the app, select [facility], and log in with your MyChart username and password.  Due to Covid, a mask is required upon entering the hospital/clinic. If you do not have a mask, one will be given to you upon arrival. For doctor visits, patients may have 1 support person aged 18 or older with them. For treatment visits, patients cannot have anyone with them due to current Covid guidelines and our immunocompromised population.  

## 2021-05-28 NOTE — Progress Notes (Signed)
?Hematology and Oncology Follow Up Visit ? ?[name] [name] [place] ?[id number] ?May 23, 1945 76 y.o. ?05/28/2021 ? ? ?Principle Diagnosis:  ?Hepatocellular carcinoma-multifocal ?Hepatitis C ?  ?Current Therapy:        ?Status post cycle 1 of nivolumab/ipilimumab -- d/c on 10/13/2020 due to hepatic toxicity ?Nivolumab 240 mg IV every 2 weeks - s/p cycle #10 -- started 11/21/2020 ?  ?Interim History:  [name] is here today for follow-up and treatment.  As always, she is incredibly considerate and kind.  She is scheduled for MRI this Saturday. ? ?Her last alpha-fetoprotein was up a little bit at 17. ? ?The MRI will be critical.  If we do see that there is some progression, I will add Avastin to her nivolumab. ? ?She has had no abdominal pain.  There is been no problems with bowels or bladder. ? ?I still think that we have to get the Hepatitis C treated.  Hopefully we can get this taken care of. ? ?She has had a good Easter.  She and her daughter were with family. ? ?She has had no bleeding.  There is been no cough or shortness of breath. ? ?Overall, her performance status is ECOG 1.   ? ? ?Medications:  ?Allergies as of 05/28/2021   ? ?   Reactions  ? Amoxicillin Swelling, Rash  ? Daughter is unsure if patient is allergic to Amoxicillin or Clarithromycin.  ? Bactrim [sulfamethoxazole-trimethoprim] Swelling  ? Clarithromycin Swelling, Rash  ? Daughter is unsure if Clarithromycin or Amoxicillin caused rash and swelling.  ? ?  ? ?  ?Medication List  ?  ? ?  ? Accurate as of May 28, 2021  1:39 PM. If you have any questions, ask your nurse or doctor.  ?  ?  ? ?  ? ?brimonidine 0.2 % ophthalmic solution ?Commonly known as: ALPHAGAN ?1 drop 2 (two) times daily. ?  ?HumuLIN 70/30 (70-30) 100 UNIT/ML injection ?Generic drug: insulin NPH-regular Human ?Inject 20 units into the skin every morning and 5 units every afternoon with meals ?  ?losartan 50 MG tablet ?Commonly known as: COZAAR ?Take 1 tablet (50 mg  total) by mouth at bedtime. ?  ?NIFEdipine 30 MG 24 hr tablet ?Commonly known as: PROCARDIA-XL/NIFEDICAL-XL ?Take 1 tablet (30 mg total) by mouth in the morning and at bedtime. MUST HAVE OFFICE VISIT FOR REFILLS ?  ?pantoprazole 40 MG tablet ?Commonly known as: Protonix ?Take 1 tablet (40 mg total) by mouth 2 (two) times daily. ?  ?polyethylene glycol powder 17 GM/SCOOP powder ?Commonly known as: GLYCOLAX/MIRALAX ?Take 17 g by mouth daily as needed. ?  ?True Metrix Blood Glucose Test test strip ?Generic drug: glucose blood ?Use as instructed ?  ? ?  ? ? ?Allergies:  ?Allergies  ?Allergen Reactions  ? Amoxicillin Swelling and Rash  ?  Daughter is unsure if patient is allergic to Amoxicillin or Clarithromycin.  ? Bactrim [Sulfamethoxazole-Trimethoprim] Swelling  ? Clarithromycin Swelling and Rash  ?  Daughter is unsure if Clarithromycin or Amoxicillin caused rash and swelling.  ? ? ?Past Medical History, Surgical history, Social history, and Family History were reviewed and updated. ? ?Review of Systems: ?Review of Systems  ?Constitutional: Negative.   ?HENT: Negative.    ?Eyes: Negative.   ?Respiratory: Negative.    ?Cardiovascular: Negative.   ?Gastrointestinal: Negative.   ?Genitourinary: Negative.   ?Musculoskeletal: Negative.   ?Skin: Negative.   ?Neurological:  Positive for tingling.  ?Endo/Heme/Allergies: Negative.   ?Psychiatric/Behavioral: Negative.    ? ? ?  Physical Exam: ? weight is 146 lb (66.2 kg). Her oral temperature is 99.1 ?F (37.3 ?C). Her blood pressure is 143/63 (abnormal) and her pulse is 82. Her respiration is 17 and oxygen saturation is 100%.  ? ?Wt Readings from Last 3 Encounters:  ?05/28/21 146 lb (66.2 kg)  ?05/14/21 147 lb (66.7 kg)  ?05/06/21 147 lb (66.7 kg)  ? ? ?Physical Exam ?Vitals reviewed.  ?HENT:  ?   Head: Normocephalic and atraumatic.  ?Eyes:  ?   Pupils: Pupils are equal, round, and reactive to light.  ?Cardiovascular:  ?   Rate and Rhythm: Normal rate and regular rhythm.  ?    Heart sounds: Normal heart sounds.  ?Pulmonary:  ?   Effort: Pulmonary effort is normal.  ?   Breath sounds: Normal breath sounds.  ?Abdominal:  ?   General: Bowel sounds are normal.  ?   Palpations: Abdomen is soft.  ?   Comments: Abdominal exam shows a well-healed laparotomy scar.  She has no fluid wave.  There is no guarding or rebound tenderness to palpation.  There is no palpable liver or spleen tip.  ?Musculoskeletal:     ?   General: No tenderness or deformity. Normal range of motion.  ?   Cervical back: Normal range of motion.  ?Lymphadenopathy:  ?   Cervical: No cervical adenopathy.  ?Skin: ?   General: Skin is warm and dry.  ?   Findings: No erythema or rash.  ?Neurological:  ?   Mental Status: She is alert and oriented to person, place, and time.  ?Psychiatric:     ?   Behavior: Behavior normal.     ?   Thought Content: Thought content normal.     ?   Judgment: Judgment normal.  ? ?  ? ?Lab Results  ?Component Value Date  ? WBC 4.9 05/28/2021  ? HGB 12.9 05/28/2021  ? HCT 37.3 05/28/2021  ? MCV 90.5 05/28/2021  ? PLT 166 05/28/2021  ? ?Lab Results  ?Component Value Date  ? FERRITIN 603 (H) 10/02/2020  ? IRON 31 (L) 10/02/2020  ? TIBC 241 10/02/2020  ? UIBC 211 10/02/2020  ? IRONPCTSAT 13 (L) 10/02/2020  ? ?Lab Results  ?Component Value Date  ? RBC 4.12 05/28/2021  ? ?No results found for: KPAFRELGTCHN, LAMBDASER, KAPLAMBRATIO ?No results found for: IGGSERUM, IGA, IGMSERUM ?No results found for: TOTALPROTELP, ALBUMINELP, A1GS, A2GS, BETS, BETA2SER, GAMS, MSPIKE, SPEI ?  Chemistry   ?   ?Component Value Date/Time  ? NA 136 05/14/2021 1225  ? K 3.9 05/14/2021 1225  ? CL 103 05/14/2021 1225  ? CO2 26 05/14/2021 1225  ? BUN 13 05/14/2021 1225  ? CREATININE 0.69 05/14/2021 1225  ?    ?Component Value Date/Time  ? CALCIUM 10.9 (H) 05/14/2021 1225  ? ALKPHOS 260 (H) 05/14/2021 1225  ? AST 82 (H) 05/14/2021 1225  ? ALT 88 (H) 05/14/2021 1225  ? BILITOT 0.8 05/14/2021 1225  ?  ? ? ? ?Impression and Plan: Ms.  [name] [name] is a very pleasant 76 yo female from [place] with hepatocellular carcinoma and Hepatitis C.  ? ?We will go ahead with the nivolumab today.  I am happy to see that her LFTs are improving. ? ?Again, the MRI will be critical.  This will help dictate our future plans for therapy.  If we do see some progression, we will add the Avastin to the nivolumab. ? ?I am just happy that she is doing well.  Her quality of life is good. ? ?We will plan to get her back to see [place] in another couple weeks, as always. ? ? ?Volanda Napoleon, MD ?4/13/20231:39 PM ? ?

## 2021-05-28 NOTE — Patient Instructions (Signed)

## 2021-05-29 LAB — AFP TUMOR MARKER: AFP, Serum, Tumor Marker: 16.3 ng/mL — ABNORMAL HIGH (ref 0.0–9.2)

## 2021-05-30 ENCOUNTER — Encounter (HOSPITAL_BASED_OUTPATIENT_CLINIC_OR_DEPARTMENT_OTHER): Payer: Self-pay

## 2021-05-30 ENCOUNTER — Ambulatory Visit (HOSPITAL_BASED_OUTPATIENT_CLINIC_OR_DEPARTMENT_OTHER)
Admission: RE | Admit: 2021-05-30 | Discharge: 2021-05-30 | Disposition: A | Discharge status: Home / Self Care | Payer: Self-pay | Source: Ambulatory Visit | Attending: Hematology & Oncology | Admitting: Hematology & Oncology

## 2021-05-30 ENCOUNTER — Other Ambulatory Visit: Payer: Self-pay

## 2021-05-30 DIAGNOSIS — C220 Liver cell carcinoma: Secondary | ICD-10-CM | POA: Insufficient documentation

## 2021-05-30 IMAGING — MR MR ABDOMEN WO/W CM
9 of 17 series · 24 of 48 positions shown · IV contrast (GADAVIST)
Comparison: [DATE], [DATE]

CLINICAL DATA: Hepatocellular carcinoma, assess treatment response

EXAM:
MRI ABDOMEN WITHOUT AND WITH CONTRAST
TECHNIQUE: Multiplanar multisequence MR imaging of the abdomen was performed
both before and after the administration of intravenous contrast.
CONTRAST:  6.6mL GADAVIST GADOBUTROL 1 MMOL/ML IV SOLN

[Series 4: T2 · coronal · 7.0mm · 1.33mm/px · 1 of 29 slices shown (1 of 2)]
[im 1/29]
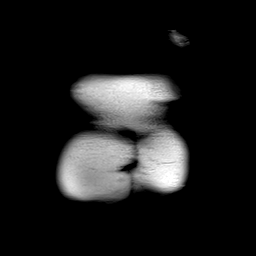

[Series 5: T2 fat-sat · axial · 6.0mm · 1.37mm/px · 1 of 34 slices shown]
[im 1/34]
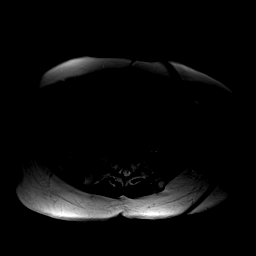

[Series 6: in + out · axial · 6.0mm · 0.68mm/px · z∈[-165,+67]mm · 4 of 64 slices shown]
[im 1/64]
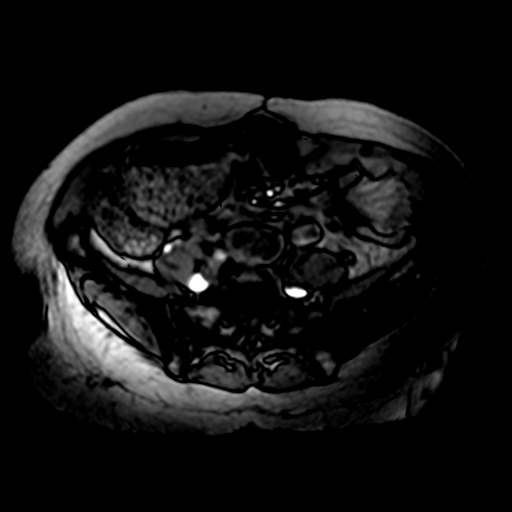
[im 22/64]
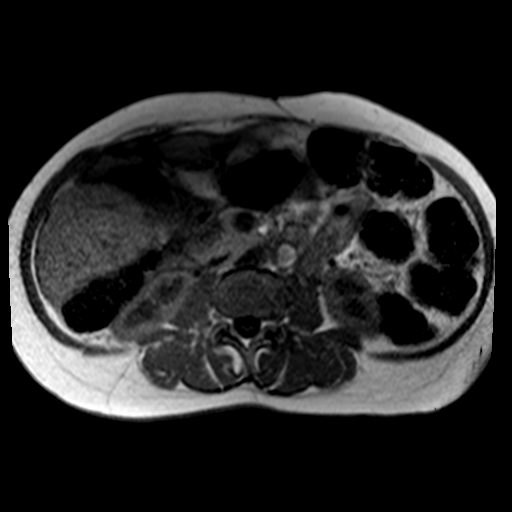
[im 43/64]
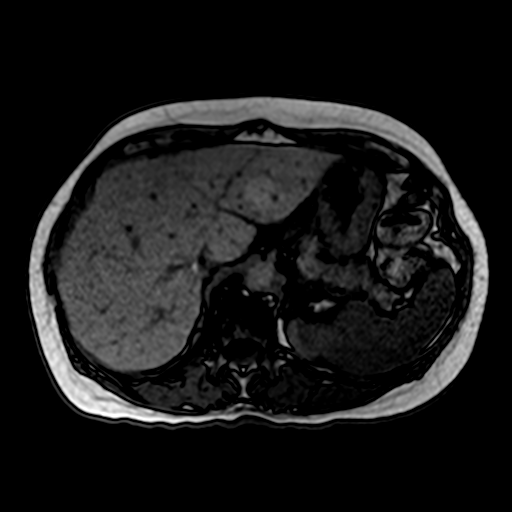
[im 64/64]
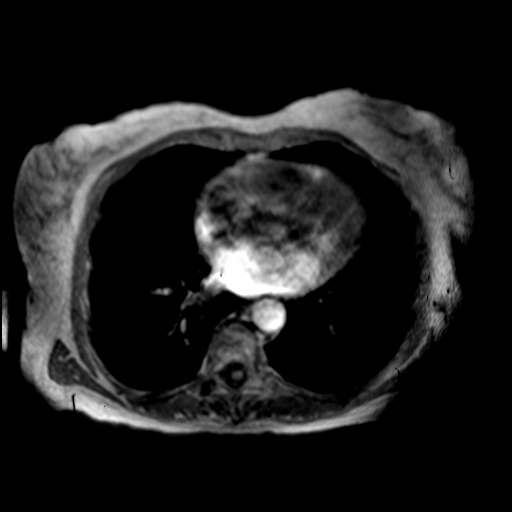

[Series 7: [id number]_800 free breathing · axial · 6.0mm · 1.82mm/px · z∈[-162,+85]mm · 6 of 101 slices shown]
[im 1/101]
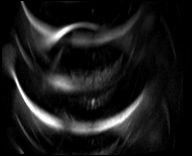
[im 21/101]
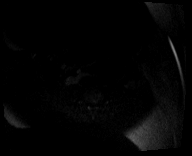
[im 41/101]
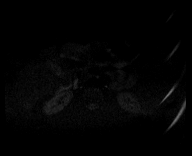
[im 61/101]
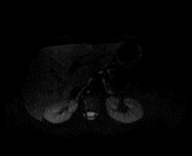
[im 81/101]
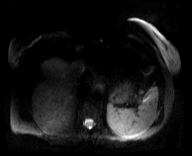
[im 101/101]
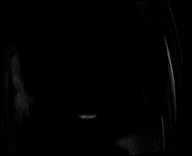

[Series 8: [id number] free breathing_adc · axial · 6.0mm · 1.82mm/px · z∈[-162,+85]mm · 2 of 34 slices shown]
[im 1/34]
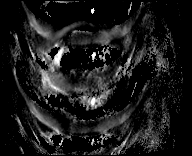
[im 34/34]
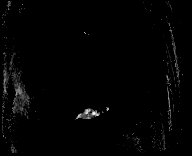

[Series 9: DWI · axial · 6.0mm · 0.68mm/px · z∈[-165,+67]mm · 2 of 32 slices shown]
[im 1/32]
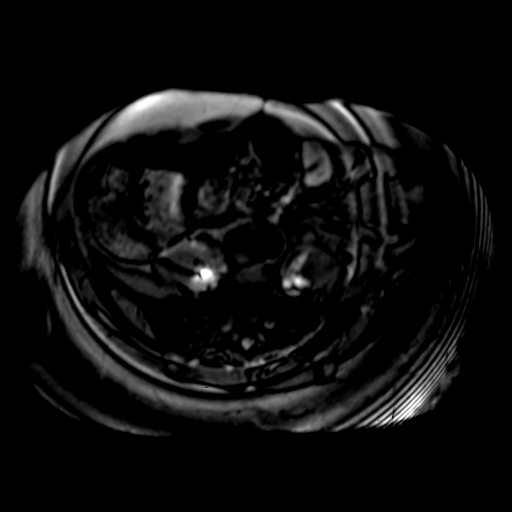
[im 32/32]
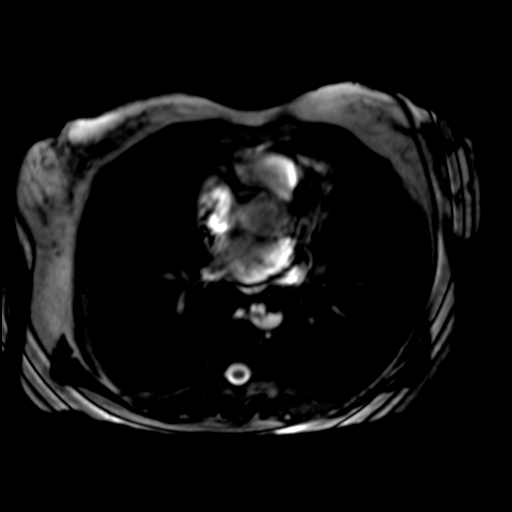

[Series 18: T2 · axial · 6.0mm · 1.33mm/px · z∈[-165,+67]mm · 2 of 32 slices shown (2 of 2)]
[im 1/32]
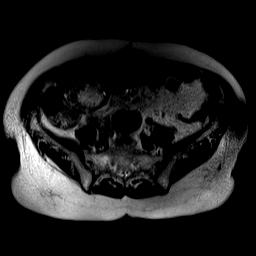
[im 32/32]
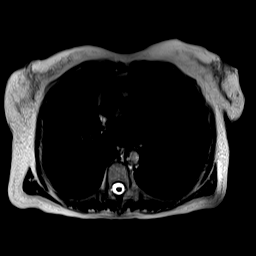

[Series 19: T1 dynamic fat-sat post-contrast · axial · delayed · 5.0mm · 1.22mm/px · z∈[-166,+69]mm · 3 of 48 slices shown (1 of 2)]
[im 1/48]
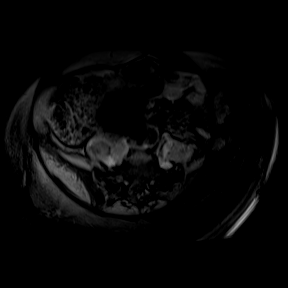
[im 24/48]
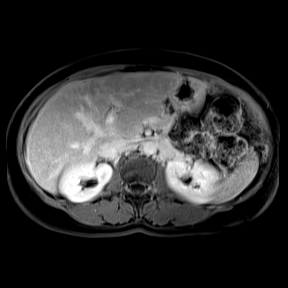
[im 48/48]
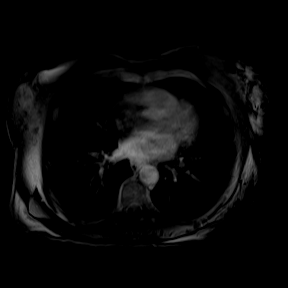

[Series 20: T1 dynamic fat-sat post-contrast · axial · 5.0mm · 1.22mm/px · z∈[-166,+69]mm · 3 of 48 slices shown (2 of 2)]
[im 1/48]
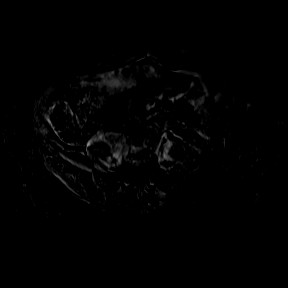
[im 24/48]
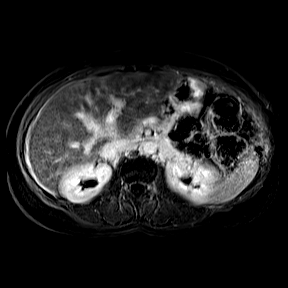
[im 48/48]
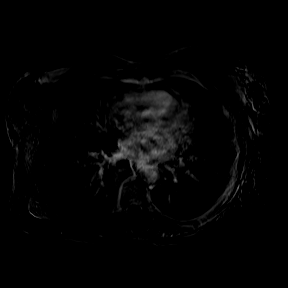

[24 of 48 positions shown; findings below may reference images not displayed]

FINDINGS: Lower chest: No acute findings.

Hepatobiliary: Coarse, nodular, cirrhotic morphology of the liver.
Continued interval reduction in size of a rim hypoenhancing mass of
hepatic segment III, measuring 2.4 x 2.2 cm, previously 2.8 x 2.5 cm
(series 13, image 29). An additional lesion of hepatic segment II is
likewise diminished in size, measuring 0.5 cm, previously 1.0 cm
(series 13, image 20), and of hepatic segment VII measuring 1.0 x
0.5 cm, previously 1.1 x 0.6 cm (series 13, image 17). Unchanged
arterially hyperenhancing focus of the liver dome, hepatic segment
VII/VIII, measuring 0.7 cm (series 11, image 14). No evidence of
capsular enhancement or washout. Gallstone near the gallbladder
neck. No biliary ductal dilatation.

Pancreas: No mass, inflammatory changes, or other parenchymal
abnormality identified.No pancreatic ductal dilatation.

Spleen:  Within normal limits in size and appearance.

Adrenals/Urinary Tract: Normal adrenal glands. No renal masses or
suspicious contrast enhancement identified. No evidence of
hydronephrosis.

Stomach/Bowel: Susceptibility artifact in the vicinity of the
gastric antrum in keeping with DOLVIS repair of gastric
perforation. Large burden of stool throughout the included colon.

Vascular/Lymphatic: No pathologically enlarged lymph nodes
identified. No abdominal aortic aneurysm demonstrated.

Other:  Trace ascites.

Musculoskeletal: No suspicious osseous lesions identified.
IMPRESSION: 1. Continued interval reduction in size of a dominant mass of
hepatic segment III as well as additional smaller masses, consistent
continued treatment response of biopsy proven hepatocellular
carcinoma.
2. Unchanged subcentimeter arterially hyperenhancing focus of the
liver dome, although distinct in enhancement characteristics from
other multifocal hepatocellular carcinoma lesions, of intermediate
suspicion for hepatocellular carcinoma. This is DOLVIS category 3
lesion if so characterized. Attention on follow-up.
3. No new suspicious lesions identified.
4. Trace ascites.
5. Cholelithiasis.

## 2021-05-30 MED ORDER — GADOBUTROL 1 MMOL/ML IV SOLN
6.6000 mL | Freq: Once | INTRAVENOUS | Status: AC | PRN
  Administered 2021-05-30: 6.6 mL via INTRAVENOUS

## 2021-06-01 ENCOUNTER — Telehealth: Payer: Self-pay | Admitting: Gastroenterology

## 2021-06-01 NOTE — Telephone Encounter (Signed)
Inbound call from patient daughter. Would like a call back because there is confusion about the prep. Whether she should pick up from what pharmacy or pick up from office. Advised patient normally from pharmacy but she reports she was told to come to the office. Best contact number [phone] ?

## 2021-06-03 NOTE — Telephone Encounter (Signed)
Spoke with patient's daughter and informed her sample of Plenvu was placed up front for them.  ?

## 2021-06-11 ENCOUNTER — Inpatient Hospital Stay: Payer: Self-pay

## 2021-06-11 ENCOUNTER — Other Ambulatory Visit: Payer: Self-pay

## 2021-06-11 ENCOUNTER — Inpatient Hospital Stay (HOSPITAL_BASED_OUTPATIENT_CLINIC_OR_DEPARTMENT_OTHER): Payer: Self-pay | Admitting: Hematology & Oncology

## 2021-06-11 ENCOUNTER — Encounter: Payer: Self-pay | Admitting: Hematology & Oncology

## 2021-06-11 VITALS — BP 128/68 | HR 92 | Temp 98.4°F | Resp 19 | Wt 149.0 lb

## 2021-06-11 DIAGNOSIS — C220 Liver cell carcinoma: Secondary | ICD-10-CM

## 2021-06-11 DIAGNOSIS — Z95828 Presence of other vascular implants and grafts: Secondary | ICD-10-CM

## 2021-06-11 LAB — CMP (CANCER CENTER ONLY)
ALT: 95 U/L — ABNORMAL HIGH (ref 0–44)
AST: 164 U/L — ABNORMAL HIGH (ref 15–41)
Albumin: 3.7 g/dL (ref 3.5–5.0)
Alkaline Phosphatase: 254 U/L — ABNORMAL HIGH (ref 38–126)
Anion gap: 9 (ref 5–15)
BUN: 25 mg/dL — ABNORMAL HIGH (ref 8–23)
CO2: 24 mmol/L (ref 22–32)
Calcium: 10.2 mg/dL (ref 8.9–10.3)
Chloride: 99 mmol/L (ref 98–111)
Creatinine: 0.82 mg/dL (ref 0.44–1.00)
GFR, Estimated: >60 mL/min (ref >60)
Glucose, Bld: 324 mg/dL — ABNORMAL HIGH (ref 70–99)
Potassium: 3.4 mmol/L — ABNORMAL LOW (ref 3.5–5.1)
Sodium: 132 mmol/L — ABNORMAL LOW (ref 135–145)
Total Bilirubin: 1 mg/dL (ref 0.3–1.2)
Total Protein: 7.6 g/dL (ref 6.5–8.1)

## 2021-06-11 LAB — CBC WITH DIFFERENTIAL (CANCER CENTER ONLY)
Abs Immature Granulocytes: 0.03 10*3/uL (ref 0.00–0.07)
Basophils Absolute: 0 10*3/uL (ref 0.0–0.1)
Basophils Relative: 0 %
Eosinophils Absolute: 0 10*3/uL (ref 0.0–0.5)
Eosinophils Relative: 0 %
HCT: 36.7 % (ref 36.0–46.0)
Hemoglobin: 12.6 g/dL (ref 12.0–15.0)
Immature Granulocytes: 1 %
Lymphocytes Relative: 16 %
Lymphs Abs: 0.8 10*3/uL (ref 0.7–4.0)
MCH: 31.3 pg (ref 26.0–34.0)
MCHC: 34.3 g/dL (ref 30.0–36.0)
MCV: 91.1 fL (ref 80.0–100.0)
Monocytes Absolute: 0.2 10*3/uL (ref 0.1–1.0)
Monocytes Relative: 5 %
Neutro Abs: 3.9 10*3/uL (ref 1.7–7.7)
Neutrophils Relative %: 78 %
Platelet Count: 168 10*3/uL (ref 150–400)
RBC: 4.03 MIL/uL (ref 3.87–5.11)
RDW: 13.8 % (ref 11.5–15.5)
WBC Count: 5 10*3/uL (ref 4.0–10.5)
nRBC: 0 % (ref 0.0–0.2)

## 2021-06-11 LAB — LACTATE DEHYDROGENASE: LDH: 211 U/L — ABNORMAL HIGH (ref 98–192)

## 2021-06-11 MED ORDER — HEPARIN SOD (PORK) LOCK FLUSH 100 UNIT/ML IV SOLN
500.0000 [IU] | Freq: Once | INTRAVENOUS | Status: AC
  Administered 2021-06-11: 500 [IU] via INTRAVENOUS

## 2021-06-11 MED ORDER — SODIUM CHLORIDE 0.9% FLUSH
10.0000 mL | Freq: Once | INTRAVENOUS | Status: AC
  Administered 2021-06-11: 10 mL via INTRAVENOUS

## 2021-06-11 NOTE — Progress Notes (Signed)
?Hematology and Oncology Follow Up Visit ? ?[name] [name] ?[id number] ?02-Feb-1946 76 y.o. ?06/11/2021 ? ? ?Principle Diagnosis:  ?Hepatocellular carcinoma-multifocal ?Hepatitis C ?  ?Current Therapy:        ?Status post cycle 1 of nivolumab/ipilimumab -- d/c on 10/13/2020 due to hepatic toxicity ?Nivolumab 240 mg IV every 2 weeks - s/p cycle #10 -- started 11/21/2020 ?  ?Interim History:  [name] is here today for follow-up and treatment.  We did go ahead and repeat her MRI.  This was done on 05/30/2021.  The MRI, thankfully, show that she was having continued response to treatment.  There is no new areas of metastasis.  She had trace ascites. ? ?Her last alpha-fetoprotein was 16.3. ? ?Unfortunately, her LFTs a little bit too high to be treated today.  She just had some lunch.  Her blood sugar 324. ? ?She is not having any abdominal pain.  There is no nausea or vomiting.  She is having no problems with diarrhea.  There is been no leg swelling.  She has had no rashes.  She has had no fever.  She has had no bleeding. ? ?Overall, I would say performance status is ECOG 1.   ? ? ?Medications:  ?Allergies as of 06/11/2021   ? ?   Reactions  ? Amoxicillin Swelling, Rash  ? Daughter is unsure if patient is allergic to Amoxicillin or Clarithromycin.  ? Bactrim [sulfamethoxazole-trimethoprim] Swelling  ? Clarithromycin Swelling, Rash  ? Daughter is unsure if Clarithromycin or Amoxicillin caused rash and swelling.  ? ?  ? ?  ?Medication List  ?  ? ?  ? Accurate as of June 11, 2021  2:05 PM. If you have any questions, ask your nurse or doctor.  ?  ?  ? ?  ? ?brimonidine 0.2 % ophthalmic solution ?Commonly known as: ALPHAGAN ?1 drop 2 (two) times daily. ?  ?HumuLIN 70/30 (70-30) 100 UNIT/ML injection ?Generic drug: insulin NPH-regular Human ?Inject 20 units into the skin every morning and 5 units every afternoon with meals ?  ?losartan 50 MG tablet ?Commonly known as: COZAAR ?Take 1 tablet (50 mg total)  by mouth at bedtime. ?  ?NIFEdipine 30 MG 24 hr tablet ?Commonly known as: PROCARDIA-XL/NIFEDICAL-XL ?Take 1 tablet (30 mg total) by mouth in the morning and at bedtime. MUST HAVE OFFICE VISIT FOR REFILLS ?  ?pantoprazole 40 MG tablet ?Commonly known as: Protonix ?Take 1 tablet (40 mg total) by mouth 2 (two) times daily. ?  ?polyethylene glycol powder 17 GM/SCOOP powder ?Commonly known as: GLYCOLAX/MIRALAX ?Take 17 g by mouth daily as needed. ?  ?True Metrix Blood Glucose Test test strip ?Generic drug: glucose blood ?Use as instructed ?  ? ?  ? ? ?Allergies:  ?Allergies  ?Allergen Reactions  ? Amoxicillin Swelling and Rash  ?  Daughter is unsure if patient is allergic to Amoxicillin or Clarithromycin.  ? Bactrim [Sulfamethoxazole-Trimethoprim] Swelling  ? Clarithromycin Swelling and Rash  ?  Daughter is unsure if Clarithromycin or Amoxicillin caused rash and swelling.  ? ? ?Past Medical History, Surgical history, Social history, and Family History were reviewed and updated. ? ?Review of Systems: ?Review of Systems  ?Constitutional: Negative.   ?HENT: Negative.    ?Eyes: Negative.   ?Respiratory: Negative.    ?Cardiovascular: Negative.   ?Gastrointestinal: Negative.   ?Genitourinary: Negative.   ?Musculoskeletal: Negative.   ?Skin: Negative.   ?Neurological:  Positive for tingling.  ?Endo/Heme/Allergies: Negative.   ?Psychiatric/Behavioral: Negative.    ? ? ?  Physical Exam: ? weight is 149 lb (67.6 kg). Her oral temperature is 98.4 ?F (36.9 ?C). Her blood pressure is 128/68 and her pulse is 92. Her respiration is 19 and oxygen saturation is 94%.  ? ?Wt Readings from Last 3 Encounters:  ?06/11/21 149 lb (67.6 kg)  ?05/28/21 146 lb (66.2 kg)  ?05/14/21 147 lb (66.7 kg)  ? ? ?Physical Exam ?Vitals reviewed.  ?HENT:  ?   Head: Normocephalic and atraumatic.  ?Eyes:  ?   Pupils: Pupils are equal, round, and reactive to light.  ?Cardiovascular:  ?   Rate and Rhythm: Normal rate and regular rhythm.  ?   Heart sounds: Normal  heart sounds.  ?Pulmonary:  ?   Effort: Pulmonary effort is normal.  ?   Breath sounds: Normal breath sounds.  ?Abdominal:  ?   General: Bowel sounds are normal.  ?   Palpations: Abdomen is soft.  ?   Comments: Abdominal exam shows a well-healed laparotomy scar.  She has no fluid wave.  There is no guarding or rebound tenderness to palpation.  There is no palpable liver or spleen tip.  ?Musculoskeletal:     ?   General: No tenderness or deformity. Normal range of motion.  ?   Cervical back: Normal range of motion.  ?Lymphadenopathy:  ?   Cervical: No cervical adenopathy.  ?Skin: ?   General: Skin is warm and dry.  ?   Findings: No erythema or rash.  ?Neurological:  ?   Mental Status: She is alert and oriented to person, place, and time.  ?Psychiatric:     ?   Behavior: Behavior normal.     ?   Thought Content: Thought content normal.     ?   Judgment: Judgment normal.  ? ?  ? ?Lab Results  ?Component Value Date  ? WBC 5.0 06/11/2021  ? HGB 12.6 06/11/2021  ? HCT 36.7 06/11/2021  ? MCV 91.1 06/11/2021  ? PLT 168 06/11/2021  ? ?Lab Results  ?Component Value Date  ? FERRITIN 603 (H) 10/02/2020  ? IRON 31 (L) 10/02/2020  ? TIBC 241 10/02/2020  ? UIBC 211 10/02/2020  ? IRONPCTSAT 13 (L) 10/02/2020  ? ?Lab Results  ?Component Value Date  ? RBC 4.03 06/11/2021  ? ?No results found for: KPAFRELGTCHN, LAMBDASER, KAPLAMBRATIO ?No results found for: IGGSERUM, IGA, IGMSERUM ?No results found for: TOTALPROTELP, ALBUMINELP, A1GS, A2GS, BETS, BETA2SER, GAMS, MSPIKE, SPEI ?  Chemistry   ?   ?Component Value Date/Time  ? NA 132 (L) 06/11/2021 1300  ? K 3.4 (L) 06/11/2021 1300  ? CL 99 06/11/2021 1300  ? CO2 24 06/11/2021 1300  ? BUN 25 (H) 06/11/2021 1300  ? CREATININE 0.82 06/11/2021 1300  ?    ?Component Value Date/Time  ? CALCIUM 10.2 06/11/2021 1300  ? ALKPHOS 254 (H) 06/11/2021 1300  ? AST 164 (H) 06/11/2021 1300  ? ALT 95 (H) 06/11/2021 1300  ? BILITOT 1.0 06/11/2021 1300  ?  ? ? ? ?Impression and Plan: [name]  is a very pleasant 76 yo female from [place] with hepatocellular carcinoma and Hepatitis C.  ? ?We will hold her nivolumab for 1 week.  Her LFTs should have come down by then. ? ?Again, we do not have to make any changes with the protocol.  I do not have to add any Avastin. ? ?I am just happy that her quality of life is doing so well right now. ? ?I will see her back  myself in 3 weeks.  She will come back next week [facility] for labs and nivolumab. ? ?She apparently has a appointment with a hepatologist next Wednesday.  Hopefully, they will get her on anti-Hepatitis C therapy.   ? ? ?Volanda Napoleon, MD ?4/27/20232:05 PM ? ?

## 2021-06-11 NOTE — Addendum Note (Signed)
Addended by: Shelda Altes on: 06/11/2021 02:03 PM ? ? Modules accepted: Orders ? ?

## 2021-06-11 NOTE — Patient Instructions (Signed)

## 2021-06-12 ENCOUNTER — Encounter: Payer: Self-pay | Admitting: *Deleted

## 2021-06-12 ENCOUNTER — Telehealth: Payer: Self-pay | Admitting: *Deleted

## 2021-06-12 LAB — AFP TUMOR MARKER: AFP, Serum, Tumor Marker: 15.5 ng/mL — ABNORMAL HIGH (ref 0.0–9.2)

## 2021-06-12 NOTE — Telephone Encounter (Signed)
-----   Message from Volanda Napoleon, MD sent at 06/12/2021  2:42 PM EDT ----- ?Call and let her know that the tumor marker keeps coming down.  This is no surprise since the MRI looked better.  thanks.  Pete ?

## 2021-06-12 NOTE — Telephone Encounter (Signed)
As noted below by Dr. Marin Olp, I informed the daughter that the tumor marker keeps coming down. The MRI looks better too. She verbalized understanding. ?

## 2021-06-15 ENCOUNTER — Other Ambulatory Visit: Payer: Self-pay

## 2021-06-15 ENCOUNTER — Other Ambulatory Visit (INDEPENDENT_AMBULATORY_CARE_PROVIDER_SITE_OTHER): Payer: Self-pay | Admitting: Internal Medicine

## 2021-06-15 ENCOUNTER — Encounter: Payer: Self-pay | Admitting: Hematology & Oncology

## 2021-06-17 ENCOUNTER — Ambulatory Visit (INDEPENDENT_AMBULATORY_CARE_PROVIDER_SITE_OTHER): Payer: Self-pay | Admitting: Internal Medicine

## 2021-06-17 ENCOUNTER — Other Ambulatory Visit (HOSPITAL_COMMUNITY): Payer: Self-pay

## 2021-06-17 ENCOUNTER — Other Ambulatory Visit: Payer: Self-pay

## 2021-06-17 ENCOUNTER — Encounter: Payer: Self-pay | Admitting: Internal Medicine

## 2021-06-17 VITALS — Ht 66.0 in | Wt 147.0 lb

## 2021-06-17 DIAGNOSIS — B182 Chronic viral hepatitis C: Secondary | ICD-10-CM

## 2021-06-17 DIAGNOSIS — R188 Other ascites: Secondary | ICD-10-CM

## 2021-06-17 DIAGNOSIS — K7460 Unspecified cirrhosis of liver: Secondary | ICD-10-CM

## 2021-06-17 NOTE — Patient Instructions (Signed)
Let's do blood work today to help approve medication ? ? ?Once things are approved our pharmacy team will call you ? ?We plan to give you epclusa for 24 weeks ? ?You should continue to see GI at least once a year due to cirrhosis, so they can do EGD and monitor for varices/bleeding in the gut ? ? ? ?

## 2021-06-17 NOTE — Addendum Note (Signed)
Addended by: Caffie Pinto on: 06/17/2021 12:06 PM ? ? Modules accepted: Orders ? ?

## 2021-06-17 NOTE — Progress Notes (Signed)
?  ? ? ? ? ?[facility] ? ?Patient Active Problem List  ? Diagnosis Date Noted  ? Hypercalcemia 09/22/2020  ? Chronic hepatitis C with hepatic coma ([place]) 09/22/2020  ? Goals of care, counseling/discussion 09/01/2020  ? Hepatocellular carcinoma ([place]) 08/14/2020  ? Hyperglycemia 08/10/2020  ? Pneumoperitoneum 08/09/2020  ? History of colorectal cancer 08/09/2020  ? Liver mass, left lobe 08/09/2020  ? Insulin-requiring or dependent type II diabetes mellitus ([place]) 08/09/2020  ? Hypertension associated with diabetes ([place]) 08/09/2020  ? Constipation, chronic 08/09/2020  ? Perforated gastric ulcer s/p lap omental Phillip Heal patch 08/09/2020 08/09/2020  ? ? ? ? ?Subjective:  ? ? Patient ID: [name] [name], female    DOB: 1945/07/05, 76 y.o.   MRN: [id number] ? ?No chief complaint on file. ? ? ?HPI: ? ?[name] [name] is a 76 y.o. female with hcc/chronic hep c, here for evaluation for hep c treatment ? ?She is known to our clinic. When she was first evaluated a year ago, decision to treat hep c was deferred given her active bulky HCC ? ?She has been undergoing hcc treatment with oncology via immune therapy and her lesion has improved significantly on 02/2021 mra. There is a plan to repeat mra in 05/2021 ? ?She is feeling well. No fatigue, ascites, blood/melena per rectum, joint pain, skin rash ? ?She has mild-moderate lft elevation that is stable ? ?Of note, the mra did  mention sign of cirrhosis and portal hypertension with trace ascites ?She has egd 3/09 which saw gastritis but no varices. Colonoscopy same day also no varices except sigmoid diverticulosis ? ?She never needed paracentesis or uses diuretics for hx of ascites. No hx gib ?-------------------- ?06/17/21 id f/u ?Checkpoint inhibitors hold due to lft up -- assymptomatic ?Feels well/baseline ?Dr Marin Olp wants to treat her for hep c -- recent repeat mri stable/improved but still evidence of hcc ? ? ?Allergies  ?Allergen  Reactions  ? Amoxicillin Swelling and Rash  ?  Daughter is unsure if patient is allergic to Amoxicillin or Clarithromycin.  ? Bactrim [Sulfamethoxazole-Trimethoprim] Swelling  ? Clarithromycin Swelling and Rash  ?  Daughter is unsure if Clarithromycin or Amoxicillin caused rash and swelling.  ? ? ? ? ?Outpatient Medications Prior to Visit  ?Medication Sig Dispense Refill  ? insulin NPH-regular Human (HUMULIN 70/30) (70-30) 100 UNIT/ML injection Inject 20 units into the skin every morning and 5 units every afternoon with meals 10 mL 0  ? brimonidine (ALPHAGAN) 0.2 % ophthalmic solution 1 drop 2 (two) times daily.    ? glucose blood (TRUE METRIX BLOOD GLUCOSE TEST) test strip Use as instructed 100 each 12  ? losartan (COZAAR) 50 MG tablet Take 1 tablet (50 mg total) by mouth at bedtime. 30 tablet 6  ? NIFEdipine (PROCARDIA-XL/NIFEDICAL-XL) 30 MG 24 hr tablet Take 1 tablet (30 mg total) by mouth in the morning and at bedtime. MUST HAVE OFFICE VISIT FOR REFILLS 60 tablet 0  ? pantoprazole (PROTONIX) 40 MG tablet Take 1 tablet (40 mg total) by mouth 2 (two) times daily. 60 tablet 3  ? polyethylene glycol powder (GLYCOLAX/MIRALAX) 17 GM/SCOOP powder Take 17 g by mouth daily as needed.    ? ?Facility-Administered Medications Prior to Visit  ?Medication Dose Route Frequency Provider Last Rate Last Admin  ? sodium chloride flush (NS) 0.9 % injection 10 mL  10 mL Intravenous PRN Celso Amy, NP   10 mL at 12/18/20 1009  ? ? ? ?Social History  ? ?  Socioeconomic History  ? Marital status: Widowed  ?  Spouse name: Not on file  ? Number of children: 5  ? Years of education: 2  ? Highest education level: Associate degree: occupational, Hotel manager, or vocational program  ?Occupational History  ? Not on file  ?Tobacco Use  ? Smoking status: Never  ? Smokeless tobacco: Never  ?Vaping Use  ? Vaping Use: Never used  ?Substance and Sexual Activity  ? Alcohol use: Not Currently  ? Drug use: Never  ? Sexual activity: Not Currently   ?Other Topics Concern  ? Not on file  ?Social History Narrative  ? Originally from [place].  Speaks [place] and [place]  ? ?Social Determinants of Health  ? ?Financial Resource Strain: Not on file  ?Food Insecurity: Not on file  ?Transportation Needs: Not on file  ?Physical Activity: Not on file  ?Stress: Not on file  ?Social Connections: Not on file  ?Intimate Partner Violence: Not on file  ? ? ? ? ?Review of Systems ?   ?All other ros negative ? ?Objective:  ?  ?BP (P) 133/81   Pulse (P) 76   Temp (P) 97.6 ?F (36.4 ?C) (Temporal)   Ht '5\' 6"'$  (1.676 m)   Wt 147 lb (66.7 kg)   BMI 23.73 kg/m?  ?Nursing note and vital signs reviewed. ? ?Physical Exam ? ?   ? ?General/constitutional: no distress, pleasant ?HEENT: Normocephalic, PER, Conj Clear, EOMI, Oropharynx clear ?Neck supple ?CV: rrr no mrg ?Lungs: clear to auscultation, normal respiratory effort ?Abd: Soft, Nontender ?Ext: no edema ?Skin: No Rash ?Neuro: nonfocal ?MSK: no peripheral joint swelling/tenderness/warmth; back spines nontender ? ? ? ? ? ?Labs: ?Lab Results  ?Component Value Date  ? WBC 5.0 06/11/2021  ? HGB 12.6 06/11/2021  ? HCT 36.7 06/11/2021  ? MCV 91.1 06/11/2021  ? PLT 168 06/11/2021  ? ?Last metabolic panel ?Lab Results  ?Component Value Date  ? GLUCOSE 324 (H) 06/11/2021  ? NA 132 (L) 06/11/2021  ? K 3.4 (L) 06/11/2021  ? CL 99 06/11/2021  ? CO2 24 06/11/2021  ? BUN 25 (H) 06/11/2021  ? CREATININE 0.82 06/11/2021  ? GFRNONAA >60 06/11/2021  ? CALCIUM 10.2 06/11/2021  ? PHOS 2.5 08/10/2020  ? PHOS 2.5 08/10/2020  ? PROT 7.6 06/11/2021  ? ALBUMIN 3.7 06/11/2021  ? BILITOT 1.0 06/11/2021  ? ALKPHOS 254 (H) 06/11/2021  ? AST 164 (H) 06/11/2021  ? ALT 95 (H) 06/11/2021  ? ANIONGAP 9 06/11/2021  ? ?No results found for: INR, PROTIME ? ? ?Micro: ? ?Serology: ? ?Imaging: ?05/30/21 mri liver ?1. Continued interval reduction in size of a dominant mass of ?hepatic segment III as well as additional smaller masses, consistent ?continued treatment  response of biopsy proven hepatocellular ?carcinoma. ?2. Unchanged subcentimeter arterially hyperenhancing focus of the ?liver dome, although distinct in enhancement characteristics from ?other multifocal hepatocellular carcinoma lesions, of intermediate ?suspicion for hepatocellular carcinoma. This is a LI-RADS category 3 ?lesion if so characterized. Attention on follow-up. ?3. No new suspicious lesions identified. ?4. Trace ascites. ?5. Cholelithiasis. ? ? ?02/19/21 mr abdomen ?1. Marked interval response to therapy with diminished size of all ?visible lesions, near complete resolution of most lesions aside from ?the largest lesion which likely a small amount of residual viable ?tumor. ?2. No new lesions. ?3. Signs of cirrhosis and portal hypertension. ?4. Cholelithiasis. ?5. Diminished ascites with only trace ascites and with resolution of ?third spacing that was seen previously. ? ? ? ?Assessment & Plan:  ? ?  Problem List Items Addressed This Visit   ?None ?Reviewed natural hx of chronic hep c ?Reviewed treatment benefit and treatment initiation/timing in special cases like her who has high burden nonresectable hcc ? ?Currently while there is potentially association with hep c and hcc carcinogenesis, it is unclear if not treating hep c will decrease chance of cure of hcc. The reverse seems to be true (with active hcc, the rate of cure of hep c with DAA is much lower). As she only has 2 shots at DAA I would like to see her at least stable from [facility] standpoint in terms of cure for 3-6 months before initiating DAA ? ?I agree that treating hep C successfully will reduce the [place] recurrent rate and also improve all-cause mortality for her ? ? ?--------------------- ?06/17/21 assessment ?Patient from malignancy standpoint stable but evidence of disease ?I agree with oncology regarding the recent uptick in lft likely related to checkpoint inhibitor ? ?Will need hcv genotype/rna quant for insurance authorization ? ?Given  cirhosis/decompensation (ascites) will plan 24 weeks of epclusa. She is unlikely a good candidate for interferon co-treatment ? ?She needs to see ongoing GI follow up for routine variceal monitoring/ablation as needed ? ?

## 2021-06-18 ENCOUNTER — Encounter: Payer: Self-pay | Admitting: Internal Medicine

## 2021-06-18 ENCOUNTER — Inpatient Hospital Stay: Payer: Self-pay

## 2021-06-18 ENCOUNTER — Other Ambulatory Visit: Payer: Self-pay | Admitting: Internal Medicine

## 2021-06-18 ENCOUNTER — Inpatient Hospital Stay: Payer: Self-pay | Attending: Hematology & Oncology

## 2021-06-18 DIAGNOSIS — B1920 Unspecified viral hepatitis C without hepatic coma: Secondary | ICD-10-CM | POA: Insufficient documentation

## 2021-06-18 DIAGNOSIS — Z5112 Encounter for antineoplastic immunotherapy: Secondary | ICD-10-CM | POA: Insufficient documentation

## 2021-06-18 DIAGNOSIS — C220 Liver cell carcinoma: Secondary | ICD-10-CM | POA: Insufficient documentation

## 2021-06-18 LAB — CBC WITH DIFFERENTIAL (CANCER CENTER ONLY)
Abs Immature Granulocytes: 0.01 10*3/uL (ref 0.00–0.07)
Basophils Absolute: 0 10*3/uL (ref 0.0–0.1)
Basophils Relative: 1 %
Eosinophils Absolute: 0.1 10*3/uL (ref 0.0–0.5)
Eosinophils Relative: 2 %
HCT: 36.1 % (ref 36.0–46.0)
Hemoglobin: 12.4 g/dL (ref 12.0–15.0)
Immature Granulocytes: 0 %
Lymphocytes Relative: 33 %
Lymphs Abs: 1.5 10*3/uL (ref 0.7–4.0)
MCH: 31.2 pg (ref 26.0–34.0)
MCHC: 34.3 g/dL (ref 30.0–36.0)
MCV: 90.9 fL (ref 80.0–100.0)
Monocytes Absolute: 0.3 10*3/uL (ref 0.1–1.0)
Monocytes Relative: 7 %
Neutro Abs: 2.7 10*3/uL (ref 1.7–7.7)
Neutrophils Relative %: 57 %
Platelet Count: 188 10*3/uL (ref 150–400)
RBC: 3.97 MIL/uL (ref 3.87–5.11)
RDW: 13.8 % (ref 11.5–15.5)
WBC Count: 4.6 10*3/uL (ref 4.0–10.5)
nRBC: 0 % (ref 0.0–0.2)

## 2021-06-18 LAB — CMP (CANCER CENTER ONLY)
ALT: 56 U/L — ABNORMAL HIGH (ref 0–44)
AST: 53 U/L — ABNORMAL HIGH (ref 15–41)
Albumin: 3.5 g/dL (ref 3.5–5.0)
Alkaline Phosphatase: 213 U/L — ABNORMAL HIGH (ref 38–126)
Anion gap: 5 (ref 5–15)
BUN: 16 mg/dL (ref 8–23)
CO2: 27 mmol/L (ref 22–32)
Calcium: 10 mg/dL (ref 8.9–10.3)
Chloride: 105 mmol/L (ref 98–111)
Creatinine: 0.74 mg/dL (ref 0.44–1.00)
GFR, Estimated: >60 mL/min (ref >60)
Glucose, Bld: 138 mg/dL — ABNORMAL HIGH (ref 70–99)
Potassium: 4.1 mmol/L (ref 3.5–5.1)
Sodium: 137 mmol/L (ref 135–145)
Total Bilirubin: 0.9 mg/dL (ref 0.3–1.2)
Total Protein: 7.2 g/dL (ref 6.5–8.1)

## 2021-06-18 MED ORDER — SODIUM CHLORIDE 0.9% FLUSH
10.0000 mL | INTRAVENOUS | Status: DC | PRN
  Administered 2021-06-18: 10 mL

## 2021-06-18 MED ORDER — SODIUM CHLORIDE 0.9 % IV SOLN: Freq: Once | INTRAVENOUS | Status: AC

## 2021-06-18 MED ORDER — SODIUM CHLORIDE 0.9 % IV SOLN
240.0000 mg | Freq: Once | INTRAVENOUS | Status: AC
  Administered 2021-06-18: 240 mg via INTRAVENOUS
  Filled 2021-06-18: qty 24

## 2021-06-18 MED ORDER — HEPARIN SOD (PORK) LOCK FLUSH 100 UNIT/ML IV SOLN
500.0000 [IU] | Freq: Once | INTRAVENOUS | Status: AC | PRN
  Administered 2021-06-18: 500 [IU]

## 2021-06-18 MED ORDER — NIFEDIPINE ER OSMOTIC RELEASE 30 MG PO TB24
30.0000 mg | ORAL_TABLET | Freq: Two times a day (BID) | ORAL | 2 refills | Status: DC
  Filled 2021-06-18: qty 60, 30d supply, fill #0
  Filled 2021-07-06 – 2021-07-14 (×2): qty 60, 30d supply, fill #1
  Filled 2021-08-13: qty 60, 30d supply, fill #2

## 2021-06-18 NOTE — Patient Instructions (Signed)
Nivolumab injection What is this medication? NIVOLUMAB (nye VOL ue mab) is a monoclonal antibody. It treats certain types of cancer. Some of the cancers treated are colon cancer, head and neck cancer, Hodgkin lymphoma, lung cancer, and melanoma. This medicine may be used for other purposes; ask your health care provider or pharmacist if you have questions. COMMON BRAND NAME(S): Opdivo What should I tell my care team before I take this medication? They need to know if you have any of these conditions: Autoimmune diseases such as Crohn's disease, ulcerative colitis, or lupus Have had or planning to have an allogeneic stem cell transplant (uses someone else's stem cells) History of chest radiation Organ transplant Nervous system problems such as myasthenia gravis or Guillain-Barre syndrome An unusual or allergic reaction to nivolumab, other medicines, foods, dyes, or preservatives Pregnant or trying to get pregnant Breast-feeding How should I use this medication? This medication is injected into a vein. It is given in a hospital or clinic setting. A special MedGuide will be given to you before each treatment. Be sure to read this information carefully each time. Talk to your care team regarding the use of this medication in children. While it may be prescribed for children as young as 12 years for selected conditions, precautions do apply. Overdosage: If you think you have taken too much of this medicine contact a poison control center or emergency room at once. NOTE: This medicine is only for you. Do not share this medicine with others. What if I miss a dose? Keep appointments for follow-up doses. It is important not to miss your dose. Call your care team if you are unable to keep an appointment. What may interact with this medication? Interactions have not been studied. This list may not describe all possible interactions. Give your health care provider a list of all the medicines, herbs,  non-prescription drugs, or dietary supplements you use. Also tell them if you smoke, drink alcohol, or use illegal drugs. Some items may interact with your medicine. What should I watch for while using this medication? Your condition will be monitored carefully while you are receiving this medication. You may need blood work done while you are taking this medication. Do not become pregnant while taking this medication or for 5 months after stopping it. Women should inform their care team if they wish to become pregnant or think they might be pregnant. There is a potential for serious harm to an unborn child. Talk to your care team for more information. Do not breast-feed an infant while taking this medication or for 5 months after stopping it. What side effects may I notice from receiving this medication? Side effects that you should report to your care team as soon as possible: Allergic reactions--skin rash, itching, hives, swelling of the face, lips, tongue, or throat Bloody or black, tar-like stools Change in vision Chest pain Diarrhea Dry cough, shortness of breath or trouble breathing Eye pain Fast or irregular heartbeat Fever, chills High blood sugar (hyperglycemia)--increased thirst or amount of urine, unusual weakness or fatigue, blurry vision High thyroid levels (hyperthyroidism)--fast or irregular heartbeat, weight loss, excessive sweating or sensitivity to heat, tremors or shaking, anxiety, nervousness, irregular menstrual cycle or spotting Kidney injury--decrease in the amount of urine, swelling of the ankles, hands, or feet Liver injury--right upper belly pain, loss of appetite, nausea, light-colored stool, dark yellow or brown urine, yellowing skin or eyes, unusual weakness or fatigue Low red blood cell count--unusual weakness or fatigue, dizziness, headache, trouble breathing   Low thyroid levels (hypothyroidism)--unusual weakness or fatigue, increased sensitivity to cold,  constipation, hair loss, dry skin, weight gain, feelings of depression Mood and behavior changes-confusion, change in sex drive or performance, irritability Muscle pain or cramps Pain, tingling, or numbness in the hands or feet, muscle weakness, trouble walking, loss of balance or coordination Red or dark brown urine Redness, blistering, peeling, or loosening of the skin, including inside the mouth Stomach pain Unusual bruising or bleeding Side effects that usually do not require medical attention (report to your care team if they continue or are bothersome): Bone pain Constipation Loss of appetite Nausea Tiredness Vomiting This list may not describe all possible side effects. Call your doctor for medical advice about side effects. You may report side effects to FDA at 1-800-FDA-1088. Where should I keep my medication? This medication is given in a hospital or clinic and will not be stored at home. NOTE: This sheet is a summary. It may not cover all possible information. If you have questions about this medicine, talk to your doctor, pharmacist, or health care provider.  2023 Elsevier/Gold Standard (2021-01-02 00:00:00)  

## 2021-06-18 NOTE — Progress Notes (Signed)
Opened in error

## 2021-06-18 NOTE — Patient Instructions (Signed)

## 2021-06-18 NOTE — Progress Notes (Signed)
CBC and Cmet reviewed with MD, ok to treat despite counts. ?

## 2021-06-19 ENCOUNTER — Encounter: Payer: Self-pay | Admitting: Internal Medicine

## 2021-06-19 ENCOUNTER — Encounter: Payer: Self-pay | Admitting: Hematology & Oncology

## 2021-06-19 ENCOUNTER — Other Ambulatory Visit: Payer: Self-pay

## 2021-06-19 ENCOUNTER — Other Ambulatory Visit: Payer: Self-pay | Admitting: Pharmacist

## 2021-06-19 ENCOUNTER — Ambulatory Visit: Payer: Self-pay | Attending: Internal Medicine | Admitting: Internal Medicine

## 2021-06-19 VITALS — BP 138/76 | HR 79 | Ht 66.0 in | Wt 147.0 lb

## 2021-06-19 DIAGNOSIS — I152 Hypertension secondary to endocrine disorders: Secondary | ICD-10-CM

## 2021-06-19 DIAGNOSIS — E1142 Type 2 diabetes mellitus with diabetic polyneuropathy: Secondary | ICD-10-CM

## 2021-06-19 DIAGNOSIS — E1159 Type 2 diabetes mellitus with other circulatory complications: Secondary | ICD-10-CM

## 2021-06-19 LAB — POCT GLYCOSYLATED HEMOGLOBIN (HGB A1C): HbA1c, POC (controlled diabetic range): 7.3 % — AB (ref 0.0–7.0)

## 2021-06-19 LAB — GLUCOSE, POCT (MANUAL RESULT ENTRY): POC Glucose: 251 mg/dl — AB (ref 70–99)

## 2021-06-19 MED ORDER — INSULIN GLARGINE 100 UNIT/ML SOLOSTAR PEN
12.0000 [IU] | PEN_INJECTOR | Freq: Every day | SUBCUTANEOUS | 99 refills | Status: DC
Start: 2021-06-19 — End: 2021-07-06
  Filled 2021-06-19: qty 3, 25d supply, fill #0

## 2021-06-19 MED ORDER — INSULIN LISPRO (1 UNIT DIAL) 100 UNIT/ML (KWIKPEN)
3.0000 [IU] | PEN_INJECTOR | Freq: Three times a day (TID) | SUBCUTANEOUS | 2 refills | Status: DC
  Filled 2021-06-19: qty 3, 34d supply, fill #0

## 2021-06-19 MED ORDER — INSULIN PEN NEEDLE 31G X 6 MM MISC
6 refills | Status: DC
  Filled 2021-06-19: qty 100, 25d supply, fill #0
  Filled 2021-07-08: qty 100, 25d supply, fill #1
  Filled 2021-10-14: qty 100, 25d supply, fill #2

## 2021-06-19 MED ORDER — NOVOLOG FLEXPEN 100 UNIT/ML ~~LOC~~ SOPN
3.0000 [IU] | PEN_INJECTOR | Freq: Three times a day (TID) | SUBCUTANEOUS | 2 refills | Status: DC
  Filled 2021-06-19: qty 15, fill #0

## 2021-06-19 MED ORDER — NOVOLOG FLEXPEN 100 UNIT/ML ~~LOC~~ SOPN
3.0000 [IU] | PEN_INJECTOR | Freq: Three times a day (TID) | SUBCUTANEOUS | 11 refills | Status: DC
  Filled 2021-06-19: qty 15, fill #0

## 2021-06-19 NOTE — Progress Notes (Signed)
Needs refills on medications. 

## 2021-06-19 NOTE — Progress Notes (Signed)
? ? ?Patient ID: [name] [name], female    DOB: Aug 21, 1945  MRN: [id number] ? ?CC: Diabetes ? ? ?Subjective: ?[name] [name] is a 75 y.o. female who presents for chronic ds management.  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  ? ?HTN: Taking and tolerating Losartan and Nifedipine XL 30 mg BID.  She ran out of Losartan and starting taking HCTZ by mistake.  HCTZ was d/c by oncologist.  Just restarted Losartan about 2 days ago. Took last dose of Nifedipine yesterday.  Needs RF which I have sent already ?Checks BP BID.  In the evenings SBP 140s ?I note however that the last 3 readings in the system have been good ? ?DM: ?Results for orders placed or performed in visit on 06/19/21  ?POCT glucose (manual entry)  ?Result Value Ref Range  ? POC Glucose 251 (A) 70 - 99 mg/dl  ?POCT glycosylated hemoglobin (Hb A1C)  ?Result Value Ref Range  ? Hemoglobin A1C    ? HbA1c POC (<> result, manual entry)    ? HbA1c, POC (prediabetic range)    ? HbA1c, POC (controlled diabetic range) 7.3 (A) 0.0 - 7.0 %  ?-BS check BID before meals. Last readings for the past three mornings 91, 61; highest 100.   ?BS now is 251.  Last eat at 10 a.m. reports that her blood sugars go quite high after meals.  Her oncologist has noted this and recommended that she speak with me about perhaps modifying her insulin regimen for better meal coverage.  She is currently on NPH/regular insulin 70/3025 units in the mornings and 5 units at night. ?Skip a dose about once Q 2-3 wks because BS low ?Wakes 1-2x/mth with low BS ?Doing okay with her eating habits. ?Due for eye exam.  She has history of glaucoma and cataracts.  She is uninsured. ? ?She is receiving immunotherapy for hepatocellular CA.  She has seen ID for  the hepatitis C.  They plan to start Epclusa x24 weeks.  She saw the gastroenterologist Dr. Tarri Glenn in March and had colonoscopy and EGD done.  Colonoscopy was incomplete due to poor prep.  Plan to repeat ? ?Patient Active Problem List  ? Diagnosis Date Noted  ? Hypercalcemia 09/22/2020  ? Chronic hepatitis C with hepatic coma ([place]) 09/22/2020  ? Goals of care, counseling/discussion 09/01/2020  ? Hepatocellular carcinoma ([place]) 08/14/2020  ? Hyperglycemia 08/10/2020  ? Pneumoperitoneum 08/09/2020  ? History of colorectal cancer 08/09/2020  ? Liver mass, left lobe 08/09/2020  ? Insulin-requiring or dependent type II diabetes mellitus ([place]) 08/09/2020  ? Hypertension associated with diabetes ([place]) 08/09/2020  ? Constipation, chronic 08/09/2020  ? Perforated gastric ulcer s/p lap omental Graham patch 08/09/2020 08/09/2020  ?  ? ?Current Outpatient Medications on File Prior to Visit  ?Medication Sig Dispense Refill  ? brimonidine (ALPHAGAN) 0.2 % ophthalmic solution 1 drop 2 (two) times daily.    ? glucose blood (TRUE METRIX BLOOD GLUCOSE TEST) test strip Use as instructed 100 each 12  ? losartan (COZAAR) 50 MG tablet Take 1 tablet (50 mg total) by mouth at bedtime. 30 tablet 6  ? NIFEdipine (PROCARDIA-XL/NIFEDICAL-XL) 30 MG 24 hr tablet Take 1 tablet (30 mg total) by mouth in the morning and at bedtime. MUST HAVE OFFICE VISIT FOR REFILLS 60 tablet  2  ? pantoprazole (PROTONIX) 40 MG tablet Take 1 tablet (40 mg total) by mouth 2 (two) times daily. 60 tablet 3  ? polyethylene glycol powder (GLYCOLAX/MIRALAX) 17 GM/SCOOP powder Take 17 g by mouth daily as needed.    ? ?Current Facility-Administered Medications on File Prior to Visit  ?Medication Dose Route Frequency Provider Last Rate Last Admin  ? sodium chloride flush (NS) 0.9 % injection 10 mL  10 mL Intravenous PRN Celso Amy, NP   10 mL at 12/18/20 1009  ? ? ?Allergies  ?Allergen Reactions  ? Amoxicillin Swelling and Rash  ?  Daughter is unsure if patient is  allergic to Amoxicillin or Clarithromycin.  ? Bactrim [Sulfamethoxazole-Trimethoprim] Swelling  ? Clarithromycin Swelling and Rash  ?  Daughter is unsure if Clarithromycin or Amoxicillin caused rash and swelling.  ? ? ?Social History  ? ?Socioeconomic History  ? Marital status: Widowed  ?  Spouse name: Not on file  ? Number of children: 5  ? Years of education: 23  ? Highest education level: Associate degree: occupational, Hotel manager, or vocational program  ?Occupational History  ? Not on file  ?Tobacco Use  ? Smoking status: Never  ? Smokeless tobacco: Never  ?Vaping Use  ? Vaping Use: Never used  ?Substance and Sexual Activity  ? Alcohol use: Not Currently  ? Drug use: Never  ? Sexual activity: Not Currently  ?Other Topics Concern  ? Not on file  ?Social History Narrative  ? Originally from [place].  Speaks [place] and [place]  ? ?Social Determinants of Health  ? ?Financial Resource Strain: Not on file  ?Food Insecurity: Not on file  ?Transportation Needs: Not on file  ?Physical Activity: Not on file  ?Stress: Not on file  ?Social Connections: Not on file  ?Intimate Partner Violence: Not on file  ? ? ?Family History  ?Problem Relation Age of Onset  ? Cancer Father   ?     type unknown, was on the leg  ? Other Daughter   ?     Acoustic neuroma  ? Breast cancer Neg Hx   ? Colon cancer Neg Hx   ? Esophageal cancer Neg Hx   ? Rectal cancer Neg Hx   ? Stomach cancer Neg Hx   ? ? ?Past Surgical History:  ?Procedure Laterality Date  ? COLON RESECTION  2005  ? In [place] - ?colon cancer  ? IR IMAGING GUIDED PORT INSERTION  09/18/2020  ? LAPAROSCOPIC GASTRIC RESECTION N/A 08/09/2020  ? Procedure: LAPAROSCOPIC EXPLORATION OF ABDOMEN, PERFORATED ULCER REPAIR, LIVER BIOPSY;  Surgeon: Michael Boston, MD;  Location: [facility];  Service: General;  Laterality: N/A;  ? ? ?ROS: ?Review of Systems ?Negative except as stated above ? ?PHYSICAL EXAM: ?BP 138/76   Pulse 79   Ht '5\' 6"'$  (1.676 m)   Wt 147 lb (66.7 kg)   SpO2 100%   BMI 23.73  kg/m?   ?Physical Exam ? ?General appearance - alert, well appearing, and in no distress ?Mental status - normal mood, behavior, speech, dress, motor activity, and thought processes ?Chest - clear to auscultation, no wheezes, rales or rhonchi, symmetric air entry ?Heart - normal rate, regular rhythm, normal S1, S2, no murmurs, rubs, clicks or gallops ?Extremities - peripheral pulses normal, no pedal edema, no clubbing or cyanosis ?Diabetic Foot Exam - Simple   ?Simple Foot Form ?Visual Inspection ?No deformities, no ulcerations, no other skin breakdown bilaterally: Yes ?Sensation Testing ?See comments: Yes ?Pulse Check ?Posterior Tibialis and  Dorsalis pulse intact bilaterally: Yes ?Comments ?Slight decrease sensation on the ball of both feet. ?  ? ? ? ? ?  Latest Ref Rng & Units 06/18/2021  ?  8:18 AM 06/11/2021  ?  1:00 PM 05/28/2021  ? 12:43 PM  ?CMP  ?Glucose 70 - 99 mg/dL 138   324   66    ?BUN 8 - 23 mg/dL '16   25   17    '$ ?Creatinine 0.44 - 1.00 mg/dL 0.74   0.82   0.64    ?Sodium 135 - 145 mmol/L 137   132   135    ?Potassium 3.5 - 5.1 mmol/L 4.1   3.4   3.4    ?Chloride 98 - 111 mmol/L 105   99   100    ?CO2 22 - 32 mmol/L '27   24   29    '$ ?Calcium 8.9 - 10.3 mg/dL 10.0   10.2   10.6    ?Total Protein 6.5 - 8.1 g/dL 7.2   7.6   7.4    ?Total Bilirubin 0.3 - 1.2 mg/dL 0.9   1.0   1.0    ?Alkaline Phos 38 - 126 U/L 213   254   228    ?AST 15 - 41 U/L 53   164   64    ?ALT 0 - 44 U/L 56   95   70    ? ?Lipid Panel  ?No results found for: CHOL, TRIG, HDL, CHOLHDL, VLDL, LDLCALC, LDLDIRECT ? ?CBC ?   ?Component Value Date/Time  ? WBC 4.6 06/18/2021 0818  ? WBC 9.8 08/16/2020 0434  ? RBC 3.97 06/18/2021 0818  ? HGB 12.4 06/18/2021 0818  ? HCT 36.1 06/18/2021 0818  ? PLT 188 06/18/2021 0818  ? MCV 90.9 06/18/2021 0818  ? MCH 31.2 06/18/2021 0818  ? MCHC 34.3 06/18/2021 0818  ? RDW 13.8 06/18/2021 0818  ? LYMPHSABS 1.5 06/18/2021 0818  ? MONOABS 0.3 06/18/2021 0818  ? EOSABS 0.1 06/18/2021 0818  ? BASOSABS 0.0 06/18/2021  0818  ? ? ?ASSESSMENT AND PLAN: ? ?1. Type 2 diabetes mellitus with peripheral neuropathy (HCC) ?A1c is slightly above goal. ?Recommend stopping NPH/regular insulin and changing her to basal insulin Lantus and m

## 2021-06-21 LAB — HEPATITIS C RNA QUANTITATIVE
HCV Quantitative Log: 6.4 log IU/mL — ABNORMAL HIGH
HCV RNA, PCR, QN: 2520000 IU/mL — ABNORMAL HIGH

## 2021-06-21 LAB — HEPATITIS C GENOTYPE

## 2021-06-22 ENCOUNTER — Other Ambulatory Visit: Payer: Self-pay

## 2021-06-22 ENCOUNTER — Other Ambulatory Visit (HOSPITAL_COMMUNITY): Payer: Self-pay

## 2021-06-25 ENCOUNTER — Ambulatory Visit: Payer: Self-pay

## 2021-06-25 ENCOUNTER — Other Ambulatory Visit: Payer: Self-pay

## 2021-06-25 NOTE — Telephone Encounter (Signed)
Phone call returned to patient and her daughter this afternoon.  They tell me that patient's blood sugars have been running high with the glargine insulin and NovoLog.  However patient had been taking the NovoLog 3 units only with breakfast and lunch.  They misunderstood that it was to be taken with all 3 meals but have started doing so.  The glargine insulin she is taking 12 units at bedtime. ?Daughter tells me that her morning blood sugar readings before breakfast over the past several days range 145-160. ?Blood sugars 2 hours after breakfast have been 375-400 ?Blood sugars 2 hours after lunch have been in the 270s. ? ?I recommend increasing glargine insulin to 14 units at bedtime and NovoLog insulin to 5 units with all 3 meals.  They expressed understanding and was able to repeat back the instructions.  Daughter will send me some of her blood sugar readings after several days of being on the increased dose. ?

## 2021-06-25 NOTE — Telephone Encounter (Signed)
Summary: elevated BS since starting new Rx  ? Patient daughter Yehuda Budd called in they are concerned asking to speak to Dr Wynetta Emery or her nurse or Lurena Joiner about clarifying how many times she take insulin lispro (HUMALOG KWIKPEN) 100 UNIT/ML KwikPen if it is twice daily or 3 times as indicated on new Rx. Also have complaints that patients blood sugar have been elevating since starting this new insulin will be about 150 upon waking and 2 hrs later around 390 and they are concerned. Asking for a call back at Ph# [phone]   ?  ? ?Chief Complaint: Pt.'s daughter reports pt. Was only taking Novolog 2 x day instead of 3 x.  ?Symptoms: BS 150 fasting in a.m.  and 2 hours after eating will be 375-390. ?Frequency:  ?Pertinent Negatives: Patient denies  ?Disposition: '[]'$ ED /'[]'$ Urgent Care (no appt availability in office) / '[]'$ Appointment(In office/virtual)/ '[]'$  [facility]/ '[]'$ Home Care/ '[]'$ Refused Recommended Disposition /'[]'$ [facility]/ '[x]'$  Follow-up with PCP ?Additional Notes: Daughter will see if BS improves after adding third dose of Novolog as ordered. Wants PCP aware.  ?Answer Assessment - Initial Assessment Questions ?1. NAME of MEDICATION: "What medicine are you calling about?" ?    Novolog insulin ?2. QUESTION: "What is your question?" (e.g., double dose of medicine, side effect) ?    Daughter reports pt. Has only been taking 3 units twice a day instead of 3 x. ?3. PRESCRIBING HCP: "Who prescribed it?" Reason: if prescribed by specialist, call should be referred to that group. ?    Dr. Wynetta Emery ?4. SYMPTOMS: "Do you have any symptoms?" ?    BS 150 fasting in a.m and sometimes 2 hours after eating BS 375-390. ?5. SEVERITY: If symptoms are present, ask "Are they mild, moderate or severe?" ?    N/a ?6. PREGNANCY:  "Is there any chance that you are pregnant?" "When was your last menstrual period?" ?    No ? ?Protocols used: Medication Question Call-A-AH ? ?

## 2021-06-25 NOTE — Telephone Encounter (Signed)
Reinterated instructions per Dr. Durenda Age 76/06/2021 OV note: ?Recommend stopping NPH/regular insulin and changing her to basal insulin Lantus and mealtime insulin with NovoLog.  We will start her with Lantus insulin 12 units at bedtime and NovoLog 3 units with meals.  Advised to check blood sugars before breakfast and before dinner with goal being 90-130.  I also want her to check 2 hours after a meal with the goal being less than 180.  I will have her follow-up with the clinical pharmacist in 2 weeks to review her blood sugar readings. ? ?Encouraged to eat CHO modified snacks HS. Advised to be cautious with giving Novolog insulin with blood sugars less that 77. Advised to eat meals after giving Novolog and not to skip meals.  ? ?Advised to administer Lantus 12 units at or around 2030- 2100.  ? ?Daughter and mother, (patient) verbalized understanding.  ? ?Will call back with readings on Monday.  ?

## 2021-06-26 ENCOUNTER — Telehealth: Payer: Self-pay

## 2021-06-26 NOTE — Telephone Encounter (Signed)
[facility] Patient Advocate Encounter ? ?Completed and sent Support Path application for Epclusa for this patient who is uninsured.   ? ?Patient assistance phone number for follow up is [phone].  ? ?This encounter will be updated until final determination.  ? ?Ileene Patrick, CPhT ?Specialty Pharmacy Patient Advocate ?[facility] ?Phone: [phone] ?Fax:  [phone]  ?

## 2021-06-30 ENCOUNTER — Ambulatory Visit (AMBULATORY_SURGERY_CENTER): Payer: Self-pay | Admitting: Gastroenterology

## 2021-06-30 ENCOUNTER — Encounter: Payer: Self-pay | Admitting: Gastroenterology

## 2021-06-30 VITALS — BP 109/60 | HR 69 | Temp 97.3°F | Resp 18 | Ht 64.0 in | Wt 147.0 lb

## 2021-06-30 DIAGNOSIS — D128 Benign neoplasm of rectum: Secondary | ICD-10-CM

## 2021-06-30 DIAGNOSIS — Z1211 Encounter for screening for malignant neoplasm of colon: Secondary | ICD-10-CM

## 2021-06-30 DIAGNOSIS — K621 Rectal polyp: Secondary | ICD-10-CM

## 2021-06-30 DIAGNOSIS — Z85038 Personal history of other malignant neoplasm of large intestine: Secondary | ICD-10-CM

## 2021-06-30 MED ORDER — SODIUM CHLORIDE 0.9 % IV SOLN: 500.0000 mL | Freq: Once | INTRAVENOUS | Status: DC

## 2021-06-30 NOTE — Progress Notes (Signed)
VS-CW 

## 2021-06-30 NOTE — Patient Instructions (Signed)
Resume previous diet and medications. Awaiting pathology results. Repeat Colonoscopy at patients discretion per Dr. Tarri Glenn, because the bowel prep was poor. (Prior to next Colonoscopy plan to take Miralax 17 G twice daily x5 days then a 2 day bowel prep. ? ?YOU HAD AN ENDOSCOPIC PROCEDURE TODAY AT [facility]:   Refer to the procedure report that was given to you for any specific questions about what was found during the examination.  If the procedure report does not answer your questions, please call your gastroenterologist to clarify.  If you requested that your care partner not be given the details of your procedure findings, then the procedure report has been included in a sealed envelope for you to review at your convenience later. ? ?YOU SHOULD EXPECT: Some feelings of bloating in the abdomen. Passage of more gas than usual.  Walking can help get rid of the air that was put into your GI tract during the procedure and reduce the bloating. If you had a lower endoscopy (such as a colonoscopy or flexible sigmoidoscopy) you may notice spotting of blood in your stool or on the toilet paper. If you underwent a bowel prep for your procedure, you may not have a normal bowel movement for a few days. ? ?Please Note:  You might notice some irritation and congestion in your nose or some drainage.  This is from the oxygen used during your procedure.  There is no need for concern and it should clear up in a day or so. ? ?SYMPTOMS TO REPORT IMMEDIATELY: ? ?Following lower endoscopy (colonoscopy or flexible sigmoidoscopy): ? Excessive amounts of blood in the stool ? Significant tenderness or worsening of abdominal pains ? Swelling of the abdomen that is new, acute ? Fever of 100?F or higher ? ?For urgent or emergent issues, a gastroenterologist can be reached at any hour by calling [phone]. ?Do not use MyChart messaging for urgent concerns.  ? ? ?DIET:  We do recommend a small meal at first, but then  you may proceed to your regular diet.  Drink plenty of fluids but you should avoid alcoholic beverages for 24 hours. ? ?ACTIVITY:  You should plan to take it easy for the rest of today and you should NOT DRIVE or use heavy machinery until tomorrow (because of the sedation medicines used during the test).   ? ?FOLLOW UP: ?Our staff will call the number listed on your records 48-72 hours following your procedure to check on you and address any questions or concerns that you may have regarding the information given to you following your procedure. If we do not reach you, we will leave a message.  We will attempt to reach you two times.  During this call, we will ask if you have developed any symptoms of COVID 19. If you develop any symptoms (ie: fever, flu-like symptoms, shortness of breath, cough etc.) before then, please call [phone].  If you test positive for Covid 19 in the 2 weeks post procedure, please call and report this information to [place].   ? ?If any biopsies were taken you will be contacted by phone or by letter within the next 1-3 weeks.  Please call us at [phone] if you have not heard about the biopsies in 3 weeks.  ? ? ?SIGNATURES/CONFIDENTIALITY: ?You and/or your care partner have signed paperwork which will be entered into your electronic medical record.  These signatures attest to the fact that that the information above on your After Visit  Summary has been reviewed and is understood.  Full responsibility of the confidentiality of this discharge information lies with you and/or your care-partner.  ?

## 2021-06-30 NOTE — Progress Notes (Signed)
Called to room to assist during endoscopic procedure.  Patient ID and intended procedure confirmed with present staff. Received instructions for my participation in the procedure from the performing physician.  

## 2021-06-30 NOTE — Progress Notes (Signed)
To pacu, VSS. Report to [email or web address] 

## 2021-06-30 NOTE — Op Note (Signed)
[facility] ?Patient Name: [name] [name] ?Procedure Date: 06/30/2021 8:30 AM ?MRN: [id number] ?Endoscopist: Thornton Park MD, MD ?Age: 76 ?Referring MD:  ?Date of Birth: 1946-01-12 ?Gender: Female ?Account #: [id number] ?Procedure:                Colonoscopy ?Indications:              Screening for colorectal malignant neoplasm, High  ?                          risk colon cancer surveillance: Personal history of  ?                          colon cancer ?                          Personal history of colon cancer: No prior  ?                          surveillance since resection in 2004 until  ?                          incomplete colonoscopy 04/23/21 due to poor prep. ?Medicines:                Monitored Anesthesia Care ?Procedure:                Pre-Anesthesia Assessment: ?                          - Prior to the procedure, a History and Physical  ?                          was performed, and patient medications and  ?                          allergies were reviewed. The patient's tolerance of  ?                          previous anesthesia was also reviewed. The risks  ?                          and benefits of the procedure and the sedation  ?                          options and risks were discussed with the patient.  ?                          All questions were answered, and informed consent  ?                          was obtained. Prior Anticoagulants: The patient has  ?                          taken no previous anticoagulant or antiplatelet  ?  agents. ASA Grade Assessment: II - A patient with  ?                          mild systemic disease. After reviewing the risks  ?                          and benefits, the patient was deemed in  ?                          satisfactory condition to undergo the procedure. ?                          After obtaining informed consent, the colonoscope  ?                          was passed under direct vision. Throughout the   ?                          procedure, the patient's blood pressure, pulse, and  ?                          oxygen saturations were monitored continuously. The  ?                          PCF-HQ190L Colonoscope was introduced through the  ?                          anus and advanced to the the cecum, identified by  ?                          appendiceal orifice and ileocecal valve. The  ?                          colonoscopy was technically difficult and complex  ?                          due to inadequate bowel prep. Successful completion  ?                          of the procedure was aided by changing the  ?                          patient's position and withdrawing and reinserting  ?                          the scope. The patient tolerated the procedure  ?                          well. The quality of the bowel preparation was good. ?Scope In: 8:47:19 AM ?Scope Out: 9:12:40 AM ?Scope Withdrawal Time: 0 hours 9 minutes 29 seconds  ?Total Procedure Duration: 0 hours 25 minutes 21 seconds  ?Findings:                 The perianal and digital  rectal examinations were  ?                          normal. ?                          A moderate amount of stool was found in the entire  ?                          colon, making visualization difficult. The residual  ?                          stool was even more than on her prior colonoscopy.  ?                          Very little meaningful evaluation of the colon  ?                          could be performed. ?                          Two sessile polyps were found in the rectum. The  ?                          polyps were 2 mm in size. These polyps were removed  ?                          with a cold snare. Resection and retrieval were  ?                          complete. Estimated blood loss was minimal. ?                          There was evidence of a prior side-to-side  ?                          ileo-colonic anastomosis in the ascending colon.  ?                           This was patent. ?Complications:            No immediate complications. ?Estimated Blood Loss:     Estimated blood loss was minimal. ?Impression:               - Stool in the entire examined colon. ?                          - Two 2 mm polyps in the rectum, removed with a  ?                          cold snare. Resected and retrieved. ?                          - Patent side-to-side ileo-colonic anastomosis. ?Recommendation:           - Patient has  a contact number available for  ?                          emergencies. The signs and symptoms of potential  ?                          delayed complications were discussed with the  ?                          patient. Return to normal activities tomorrow.  ?                          Written discharge instructions were provided to the  ?                          patient. ?                          - Resume previous diet. ?                          - Continue present medications. ?                          - Await pathology results. ?                          - Repeat colonoscopy at the next available  ?                          appointment because the bowel preparation was poor.  ?                          Plan Miralax 17 g BID x 5 days then a 2 day bowel  ?                          prep. ?Thornton Park MD, MD ?06/30/2021 9:21:26 AM ?This report has been signed electronically. ?

## 2021-06-30 NOTE — Progress Notes (Signed)
? ?Referring Provider: Ladell Pier, MD ?Primary Care Physician:  Ladell Pier, MD ? ?Indication for Procedure:  Colon cancer surveillance ? ? ?IMPRESSION:  ?Personal history of colon cancer ?Appropriate candidate for monitored anesthesia care ? ?PLAN: ?Colonoscopy in the [facility] today ? ? ?HPI: [name] [name] is a 76 y.o. female presents for surveillance colonoscopy. ? ?Personal history of colon cancer: No prior surveillance since resection in 2004 until incomplete colonoscopy 04/23/21. She remains very motivated to proceed with colonoscopy now and thinks she knows about diet changes prior to the proceed that will lead to improved success.  ? ?There is no known family history of colon cancer or polyps. No family history of stomach cancer or other GI malignancy. No family history of inflammatory bowel disease or celiac.  ? ? ?Past Medical History:  ?Diagnosis Date  ? Colon cancer ([name])   ? Diabetes mellitus without complication ([place])   ? Glaucoma   ? Goals of care, counseling/discussion 09/01/2020  ? Hepatitis C   ? Hepatocellular carcinoma ([facility])   ? Hypertension   ? ? ?Past Surgical History:  ?Procedure Laterality Date  ? COLON RESECTION  2005  ? In [place] - ?colon cancer  ? IR IMAGING GUIDED PORT INSERTION  09/18/2020  ? LAPAROSCOPIC GASTRIC RESECTION N/A 08/09/2020  ? Procedure: LAPAROSCOPIC EXPLORATION OF ABDOMEN, PERFORATED ULCER REPAIR, LIVER BIOPSY;  Surgeon: Michael Boston, MD;  Location: [facility];  Service: General;  Laterality: N/A;  ? ? ?Current Outpatient Medications  ?Medication Sig Dispense Refill  ? brimonidine (ALPHAGAN) 0.2 % ophthalmic solution 1 drop 2 (two) times daily.    ? glucose blood (TRUE METRIX BLOOD GLUCOSE TEST) test strip Use as instructed 100 each 12  ? insulin glargine (LANTUS) 100 UNIT/ML Solostar Pen Inject 12 Units into the skin at bedtime. 15 mL PRN  ? insulin lispro (HUMALOG KWIKPEN) 100 UNIT/ML KwikPen Inject 3 Units into the skin 3 (three) times daily. 3 mL 2  ?  Insulin Pen Needle 31G X 6 MM MISC use as directed 100 each 6  ? losartan (COZAAR) 50 MG tablet Take 1 tablet (50 mg total) by mouth at bedtime. 30 tablet 6  ? NIFEdipine (PROCARDIA-XL/NIFEDICAL-XL) 30 MG 24 hr tablet Take 1 tablet (30 mg total) by mouth in the morning and at bedtime. MUST HAVE OFFICE VISIT FOR REFILLS 60 tablet 2  ? pantoprazole (PROTONIX) 40 MG tablet Take 1 tablet (40 mg total) by mouth 2 (two) times daily. 60 tablet 3  ? polyethylene glycol powder (GLYCOLAX/MIRALAX) 17 GM/SCOOP powder Take 17 g by mouth daily as needed.    ? ?Current Facility-Administered Medications  ?Medication Dose Route Frequency Provider Last Rate Last Admin  ? 0.9 %  sodium chloride infusion  500 mL Intravenous Once Thornton Park, MD      ? ?Facility-Administered Medications Ordered in Other Visits  ?Medication Dose Route Frequency Provider Last Rate Last Admin  ? sodium chloride flush (NS) 0.9 % injection 10 mL  10 mL Intravenous PRN Celso Amy, NP   10 mL at 12/18/20 1009  ? ? ?Allergies as of 06/30/2021 - Review Complete 06/30/2021  ?Allergen Reaction Noted  ? Amoxicillin Swelling and Rash 09/01/2020  ? Bactrim [sulfamethoxazole-trimethoprim] Swelling 08/08/2020  ? Clarithromycin Swelling and Rash 09/01/2020  ? ? ?Family History  ?Problem Relation Age of Onset  ? Cancer Father   ?     type unknown, was on the leg  ? Other Daughter   ?  Acoustic neuroma  ? Breast cancer Neg Hx   ? Colon cancer Neg Hx   ? Esophageal cancer Neg Hx   ? Rectal cancer Neg Hx   ? Stomach cancer Neg Hx   ? ? ? ?Physical Exam: ?General:   Alert,  well-nourished, pleasant and cooperative in NAD ?Head:  Normocephalic and atraumatic. ?Eyes:  Sclera clear, no icterus.   Conjunctiva pink. ?Mouth:  No deformity or lesions.   ?Neck:  Supple; no masses or thyromegaly. ?Lungs:  Clear throughout to auscultation.   No wheezes. ?Heart:  Regular rate and rhythm; no murmurs. ?Abdomen:  Soft, non-tender, nondistended, normal bowel sounds, no  rebound or guarding.  ?Msk:  Symmetrical. No boney deformities ?LAD: No inguinal or umbilical LAD ?Extremities:  No clubbing or edema. ?Neurologic:  Alert and  oriented x4;  grossly nonfocal ?Skin:  No obvious rash or bruise. ?Psych:  Alert and cooperative. Normal mood and affect. ? ? ? ? ?Studies/Results: ?No results found. ? ? ? ?Armanii Pressnell L. Tarri Glenn, MD, MPH ?06/30/2021, 8:14 AM ? ? ? ?  ?

## 2021-07-01 ENCOUNTER — Other Ambulatory Visit: Payer: Self-pay | Admitting: Internal Medicine

## 2021-07-01 DIAGNOSIS — E1142 Type 2 diabetes mellitus with diabetic polyneuropathy: Secondary | ICD-10-CM

## 2021-07-02 ENCOUNTER — Other Ambulatory Visit: Payer: Self-pay

## 2021-07-02 ENCOUNTER — Other Ambulatory Visit: Payer: Self-pay | Admitting: Gastroenterology

## 2021-07-02 ENCOUNTER — Inpatient Hospital Stay: Payer: Self-pay

## 2021-07-02 ENCOUNTER — Encounter: Payer: Self-pay | Admitting: Gastroenterology

## 2021-07-02 ENCOUNTER — Encounter: Payer: Self-pay | Admitting: Hematology & Oncology

## 2021-07-02 ENCOUNTER — Inpatient Hospital Stay (HOSPITAL_BASED_OUTPATIENT_CLINIC_OR_DEPARTMENT_OTHER): Payer: Self-pay | Admitting: Hematology & Oncology

## 2021-07-02 ENCOUNTER — Telehealth: Payer: Self-pay

## 2021-07-02 ENCOUNTER — Telehealth: Payer: Self-pay | Admitting: *Deleted

## 2021-07-02 VITALS — BP 97/46 | HR 69 | Temp 97.9°F | Resp 18 | Wt 141.0 lb

## 2021-07-02 DIAGNOSIS — K5909 Other constipation: Secondary | ICD-10-CM

## 2021-07-02 DIAGNOSIS — C220 Liver cell carcinoma: Secondary | ICD-10-CM

## 2021-07-02 DIAGNOSIS — Z86010 Personal history of colonic polyps: Secondary | ICD-10-CM

## 2021-07-02 LAB — CMP (CANCER CENTER ONLY)
ALT: 61 U/L — ABNORMAL HIGH (ref 0–44)
AST: 52 U/L — ABNORMAL HIGH (ref 15–41)
Albumin: 3.5 g/dL (ref 3.5–5.0)
Alkaline Phosphatase: 212 U/L — ABNORMAL HIGH (ref 38–126)
Anion gap: 6 (ref 5–15)
BUN: 15 mg/dL (ref 8–23)
CO2: 27 mmol/L (ref 22–32)
Calcium: 10.1 mg/dL (ref 8.9–10.3)
Chloride: 106 mmol/L (ref 98–111)
Creatinine: 0.69 mg/dL (ref 0.44–1.00)
GFR, Estimated: >60 mL/min (ref >60)
Glucose, Bld: 108 mg/dL — ABNORMAL HIGH (ref 70–99)
Potassium: 3.5 mmol/L (ref 3.5–5.1)
Sodium: 139 mmol/L (ref 135–145)
Total Bilirubin: 0.9 mg/dL (ref 0.3–1.2)
Total Protein: 7.2 g/dL (ref 6.5–8.1)

## 2021-07-02 LAB — CBC WITH DIFFERENTIAL (CANCER CENTER ONLY)
Abs Immature Granulocytes: 0.01 10*3/uL (ref 0.00–0.07)
Basophils Absolute: 0 10*3/uL (ref 0.0–0.1)
Basophils Relative: 1 %
Eosinophils Absolute: 0.1 10*3/uL (ref 0.0–0.5)
Eosinophils Relative: 2 %
HCT: 36.3 % (ref 36.0–46.0)
Hemoglobin: 12.4 g/dL (ref 12.0–15.0)
Immature Granulocytes: 0 %
Lymphocytes Relative: 44 %
Lymphs Abs: 1.6 10*3/uL (ref 0.7–4.0)
MCH: 31.2 pg (ref 26.0–34.0)
MCHC: 34.2 g/dL (ref 30.0–36.0)
MCV: 91.2 fL (ref 80.0–100.0)
Monocytes Absolute: 0.2 10*3/uL (ref 0.1–1.0)
Monocytes Relative: 6 %
Neutro Abs: 1.7 10*3/uL (ref 1.7–7.7)
Neutrophils Relative %: 47 %
Platelet Count: 184 10*3/uL (ref 150–400)
RBC: 3.98 MIL/uL (ref 3.87–5.11)
RDW: 13.5 % (ref 11.5–15.5)
WBC Count: 3.7 10*3/uL — ABNORMAL LOW (ref 4.0–10.5)
nRBC: 0 % (ref 0.0–0.2)

## 2021-07-02 LAB — LACTATE DEHYDROGENASE: LDH: 145 U/L (ref 98–192)

## 2021-07-02 MED ORDER — SODIUM CHLORIDE 0.9 % IV SOLN
240.0000 mg | Freq: Once | INTRAVENOUS | Status: AC
  Administered 2021-07-02: 240 mg via INTRAVENOUS
  Filled 2021-07-02: qty 24

## 2021-07-02 MED ORDER — SODIUM CHLORIDE 0.9 % IV SOLN: Freq: Once | INTRAVENOUS | Status: AC

## 2021-07-02 MED ORDER — HEPARIN SOD (PORK) LOCK FLUSH 100 UNIT/ML IV SOLN
500.0000 [IU] | Freq: Once | INTRAVENOUS | Status: AC | PRN
  Administered 2021-07-02: 500 [IU]

## 2021-07-02 MED ORDER — NA SULFATE-K SULFATE-MG SULF 17.5-3.13-1.6 GM/177ML PO SOLN: ORAL | 0 refills | Status: DC

## 2021-07-02 MED ORDER — LINACLOTIDE 145 MCG PO CAPS: ORAL_CAPSULE | ORAL | 0 refills | Status: DC

## 2021-07-02 MED ORDER — SODIUM CHLORIDE 0.9% FLUSH
10.0000 mL | INTRAVENOUS | Status: DC | PRN
  Administered 2021-07-02: 10 mL

## 2021-07-02 NOTE — Patient Instructions (Signed)

## 2021-07-02 NOTE — Telephone Encounter (Signed)
Per 07/02/21 los - gave upcoming appointments - confirmed

## 2021-07-02 NOTE — Progress Notes (Signed)
MD reviewed CBC and CMET, ok to treat despite counts. 

## 2021-07-02 NOTE — Progress Notes (Signed)
Hematology and Oncology Follow Up Visit  [name] [name] [id number] May 28, 1945 76 y.o. 07/02/2021   Principle Diagnosis:  Hepatocellular carcinoma-multifocal Hepatitis C   Current Therapy:        Status post cycle 1 of nivolumab/ipilimumab -- d/c on 10/13/2020 due to hepatic toxicity Nivolumab 240 mg IV every 2 weeks - s/p cycle #10 -- started 11/21/2020   Interim History:  [name] [name] is here today for follow-up and treatment.  She is doing okay.  Her family doctor has changed her insulin around a little bit.  Hopefully, this will help with her blood sugar control..  She did see the Hepatitis C specialist.  She will be able to start therapy for this.  Back in early May, she had a quantitative amount of 2.5 million.  Her last alpha-fetoprotein was stable at 15.5.  She has had no problems with cough or shortness of breath.  She was post to have a colonoscopy but did not have a good prep for this.  This has been rescheduled.  She has had no problems with fever.  There is been no bleeding.  She has had no leg swelling.  Currently, I would say performance status is ECOG 1.     Medications:  Allergies as of 07/02/2021       Reactions   Amoxicillin Swelling, Rash   Daughter is unsure if patient is allergic to Amoxicillin or Clarithromycin.   Bactrim [sulfamethoxazole-trimethoprim] Swelling   Clarithromycin Swelling, Rash   Daughter is unsure if Clarithromycin or Amoxicillin caused rash and swelling.        Medication List        Accurate as of Jul 02, 2021  8:41 AM. If you have any questions, ask your nurse or doctor.          Basaglar KwikPen 100 UNIT/ML Inject 12 Units into the skin at bedtime.   brimonidine 0.2 % ophthalmic solution Commonly known as: ALPHAGAN 1 drop 2 (two) times daily.   HumaLOG KwikPen 100 UNIT/ML KwikPen Generic drug: insulin lispro Inject 3 Units into the skin 3 (three) times daily.   losartan 50 MG tablet Commonly  known as: COZAAR Take 1 tablet (50 mg total) by mouth at bedtime.   NIFEdipine 30 MG 24 hr tablet Commonly known as: PROCARDIA-XL/NIFEDICAL-XL Take 1 tablet (30 mg total) by mouth in the morning and at bedtime. MUST HAVE OFFICE VISIT FOR REFILLS   pantoprazole 40 MG tablet Commonly known as: Protonix Take 1 tablet (40 mg total) by mouth 2 (two) times daily.   polyethylene glycol powder 17 GM/SCOOP powder Commonly known as: GLYCOLAX/MIRALAX Take 17 g by mouth daily as needed.   True Metrix Blood Glucose Test test strip Generic drug: glucose blood Use as instructed   TRUEplus 5-Bevel Pen Needles 31G X 6 MM Misc Generic drug: Insulin Pen Needle use as directed        Allergies:  Allergies  Allergen Reactions   Amoxicillin Swelling and Rash    Daughter is unsure if patient is allergic to Amoxicillin or Clarithromycin.   Bactrim [Sulfamethoxazole-Trimethoprim] Swelling   Clarithromycin Swelling and Rash    Daughter is unsure if Clarithromycin or Amoxicillin caused rash and swelling.    Past Medical History, Surgical history, Social history, and Family History were reviewed and updated.  Review of Systems: Review of Systems  Constitutional: Negative.   HENT: Negative.    Eyes: Negative.   Respiratory: Negative.    Cardiovascular: Negative.   Gastrointestinal: Negative.  Genitourinary: Negative.   Musculoskeletal: Negative.   Skin: Negative.   Neurological:  Positive for tingling.  Endo/Heme/Allergies: Negative.   Psychiatric/Behavioral: Negative.      Physical Exam:  weight is 141 lb (64 kg). Her oral temperature is 97.9 F (36.6 C). Her blood pressure is 97/46 (abnormal) and her pulse is 69. Her respiration is 18 and oxygen saturation is 100%.   Wt Readings from Last 3 Encounters:  07/02/21 141 lb (64 kg)  06/30/21 147 lb (66.7 kg)  06/19/21 147 lb (66.7 kg)    Physical Exam Vitals reviewed.  HENT:     Head: Normocephalic and atraumatic.  Eyes:      Pupils: Pupils are equal, round, and reactive to light.  Cardiovascular:     Rate and Rhythm: Normal rate and regular rhythm.     Heart sounds: Normal heart sounds.  Pulmonary:     Effort: Pulmonary effort is normal.     Breath sounds: Normal breath sounds.  Abdominal:     General: Bowel sounds are normal.     Palpations: Abdomen is soft.     Comments: Abdominal exam shows a well-healed laparotomy scar.  She has no fluid wave.  There is no guarding or rebound tenderness to palpation.  There is no palpable liver or spleen tip.  Musculoskeletal:        General: No tenderness or deformity. Normal range of motion.     Cervical back: Normal range of motion.  Lymphadenopathy:     Cervical: No cervical adenopathy.  Skin:    General: Skin is warm and dry.     Findings: No erythema or rash.  Neurological:     Mental Status: She is alert and oriented to person, place, and time.  Psychiatric:        Behavior: Behavior normal.        Thought Content: Thought content normal.        Judgment: Judgment normal.      Lab Results  Component Value Date   WBC 3.7 (L) 07/02/2021   HGB 12.4 07/02/2021   HCT 36.3 07/02/2021   MCV 91.2 07/02/2021   PLT 184 07/02/2021   Lab Results  Component Value Date   FERRITIN 603 (H) 10/02/2020   IRON 31 (L) 10/02/2020   TIBC 241 10/02/2020   UIBC 211 10/02/2020   IRONPCTSAT 13 (L) 10/02/2020   Lab Results  Component Value Date   RBC 3.98 07/02/2021   No results found for: KPAFRELGTCHN, LAMBDASER, KAPLAMBRATIO No results found for: IGGSERUM, IGA, IGMSERUM No results found for: Odetta Pink, SPEI   Chemistry      Component Value Date/Time   NA 137 06/18/2021 0818   K 4.1 06/18/2021 0818   CL 105 06/18/2021 0818   CO2 27 06/18/2021 0818   BUN 16 06/18/2021 0818   CREATININE 0.74 06/18/2021 0818      Component Value Date/Time   CALCIUM 10.0 06/18/2021 0818   ALKPHOS 213 (H) 06/18/2021 0818    AST 53 (H) 06/18/2021 0818   ALT 56 (H) 06/18/2021 0818   BILITOT 0.9 06/18/2021 0818       Impression and Plan: [name] [name] is a very pleasant 76 yo female from [place] with hepatocellular carcinoma and Hepatitis C.   So far, she is on incredibly well with the nivolumab.  We will have to check her LFTs.  It be nice for her to get on treatment for the Hepatitis C.  I do not see any contraindication for this.  Hopefully, her blood sugars will get under good control.  She said that, this morning, her blood sugar was 144.  We will try to proceed with her nivolumab.  At some point, we may want to think about changing her of her nivolumab therapy to once a month.    Volanda Napoleon, MD 5/18/20238:41 AM

## 2021-07-02 NOTE — Telephone Encounter (Signed)
  Follow up Call-     06/30/2021    7:46 AM 04/23/2021   10:27 AM  Call back number  Post procedure Call Back phone  # [phone]- daughter  Permission to leave phone message Yes Yes     Patient questions:  Do you have a fever, pain , or abdominal swelling? No. Pain Score  0 *  Have you tolerated food without any problems? Yes.    Have you been able to return to your normal activities? Yes.    Do you have any questions about your discharge instructions: Diet   No. Medications  No. Follow up visit  No.  Do you have questions or concerns about your Care? No.  Actions: * If pain score is 4 or above: No action needed, pain <4.

## 2021-07-02 NOTE — Patient Instructions (Signed)
Nivolumab injection What is this medication? NIVOLUMAB (nye VOL ue mab) is a monoclonal antibody. It treats certain types of cancer. Some of the cancers treated are colon cancer, head and neck cancer, Hodgkin lymphoma, lung cancer, and melanoma. This medicine may be used for other purposes; ask your health care provider or pharmacist if you have questions. COMMON BRAND NAME(S): Opdivo What should I tell my care team before I take this medication? They need to know if you have any of these conditions: Autoimmune diseases such as Crohn's disease, ulcerative colitis, or lupus Have had or planning to have an allogeneic stem cell transplant (uses someone else's stem cells) History of chest radiation Organ transplant Nervous system problems such as myasthenia gravis or Guillain-Barre syndrome An unusual or allergic reaction to nivolumab, other medicines, foods, dyes, or preservatives Pregnant or trying to get pregnant Breast-feeding How should I use this medication? This medication is injected into a vein. It is given in a hospital or clinic setting. A special MedGuide will be given to you before each treatment. Be sure to read this information carefully each time. Talk to your care team regarding the use of this medication in children. While it may be prescribed for children as young as 12 years for selected conditions, precautions do apply. Overdosage: If you think you have taken too much of this medicine contact a poison control center or emergency room at once. NOTE: This medicine is only for you. Do not share this medicine with others. What if I miss a dose? Keep appointments for follow-up doses. It is important not to miss your dose. Call your care team if you are unable to keep an appointment. What may interact with this medication? Interactions have not been studied. This list may not describe all possible interactions. Give your health care provider a list of all the medicines, herbs,  non-prescription drugs, or dietary supplements you use. Also tell them if you smoke, drink alcohol, or use illegal drugs. Some items may interact with your medicine. What should I watch for while using this medication? Your condition will be monitored carefully while you are receiving this medication. You may need blood work done while you are taking this medication. Do not become pregnant while taking this medication or for 5 months after stopping it. Women should inform their care team if they wish to become pregnant or think they might be pregnant. There is a potential for serious harm to an unborn child. Talk to your care team for more information. Do not breast-feed an infant while taking this medication or for 5 months after stopping it. What side effects may I notice from receiving this medication? Side effects that you should report to your care team as soon as possible: Allergic reactions--skin rash, itching, hives, swelling of the face, lips, tongue, or throat Bloody or black, tar-like stools Change in vision Chest pain Diarrhea Dry cough, shortness of breath or trouble breathing Eye pain Fast or irregular heartbeat Fever, chills High blood sugar (hyperglycemia)--increased thirst or amount of urine, unusual weakness or fatigue, blurry vision High thyroid levels (hyperthyroidism)--fast or irregular heartbeat, weight loss, excessive sweating or sensitivity to heat, tremors or shaking, anxiety, nervousness, irregular menstrual cycle or spotting Kidney injury--decrease in the amount of urine, swelling of the ankles, hands, or feet Liver injury--right upper belly pain, loss of appetite, nausea, light-colored stool, dark yellow or brown urine, yellowing skin or eyes, unusual weakness or fatigue Low red blood cell count--unusual weakness or fatigue, dizziness, headache, trouble breathing   Low thyroid levels (hypothyroidism)--unusual weakness or fatigue, increased sensitivity to cold,  constipation, hair loss, dry skin, weight gain, feelings of depression Mood and behavior changes-confusion, change in sex drive or performance, irritability Muscle pain or cramps Pain, tingling, or numbness in the hands or feet, muscle weakness, trouble walking, loss of balance or coordination Red or dark brown urine Redness, blistering, peeling, or loosening of the skin, including inside the mouth Stomach pain Unusual bruising or bleeding Side effects that usually do not require medical attention (report to your care team if they continue or are bothersome): Bone pain Constipation Loss of appetite Nausea Tiredness Vomiting This list may not describe all possible side effects. Call your doctor for medical advice about side effects. You may report side effects to FDA at 1-800-FDA-1088. Where should I keep my medication? This medication is given in a hospital or clinic and will not be stored at home. NOTE: This sheet is a summary. It may not cover all possible information. If you have questions about this medicine, talk to your doctor, pharmacist, or health care provider.  2023 Elsevier/Gold Standard (2021-01-02 00:00:00)  

## 2021-07-02 NOTE — Telephone Encounter (Signed)
[facility] Patient Advocate Encounter  Completed and sent Support Path application for Epclusa for this patient who is uninsured.    Patient is approved 07/02/21 through 02/15/22.  Medication will be shipped to the clinic on 07/06/21   [name] [name], [place] Patient [facility] Phone: [phone] Fax:  [phone]

## 2021-07-03 ENCOUNTER — Telehealth: Payer: Self-pay

## 2021-07-03 LAB — AFP TUMOR MARKER: AFP, Serum, Tumor Marker: 11.5 ng/mL — ABNORMAL HIGH (ref 0.0–9.2)

## 2021-07-03 NOTE — Telephone Encounter (Addendum)
[facility] Patient Advocate Encounter  Patient's medications have been couriered to [facility] from [employer] and will be picked up 07/06/21.   1st box of [place] , [place] Patient [facility] Phone: [phone] Fax:  [phone]

## 2021-07-06 ENCOUNTER — Other Ambulatory Visit: Payer: Self-pay | Admitting: Internal Medicine

## 2021-07-06 ENCOUNTER — Other Ambulatory Visit: Payer: Self-pay

## 2021-07-06 DIAGNOSIS — E1142 Type 2 diabetes mellitus with diabetic polyneuropathy: Secondary | ICD-10-CM

## 2021-07-06 MED ORDER — INSULIN GLARGINE 100 UNIT/ML SOLOSTAR PEN
23.0000 [IU] | PEN_INJECTOR | Freq: Every day | SUBCUTANEOUS | 99 refills | Status: DC
  Filled 2021-07-06: qty 9, 39d supply, fill #0

## 2021-07-06 MED ORDER — INSULIN LISPRO (1 UNIT DIAL) 100 UNIT/ML (KWIKPEN)
15.0000 [IU] | PEN_INJECTOR | Freq: Three times a day (TID) | SUBCUTANEOUS | 2 refills | Status: DC
  Filled 2021-07-06: qty 3, 7d supply, fill #0
  Filled 2021-07-08: qty 9, 20d supply, fill #0

## 2021-07-07 ENCOUNTER — Encounter: Payer: Self-pay | Admitting: Hematology & Oncology

## 2021-07-07 ENCOUNTER — Other Ambulatory Visit: Payer: Self-pay

## 2021-07-08 ENCOUNTER — Telehealth: Payer: Self-pay

## 2021-07-08 ENCOUNTER — Encounter: Payer: Self-pay | Admitting: Hematology & Oncology

## 2021-07-08 ENCOUNTER — Other Ambulatory Visit: Payer: Self-pay

## 2021-07-08 ENCOUNTER — Other Ambulatory Visit: Payer: Self-pay | Admitting: Internal Medicine

## 2021-07-08 MED ORDER — PANTOPRAZOLE SODIUM 40 MG PO TBEC
40.0000 mg | DELAYED_RELEASE_TABLET | Freq: Two times a day (BID) | ORAL | 3 refills | Status: DC
  Filled 2021-07-08: qty 180, 90d supply, fill #0
  Filled 2022-01-15 (×2): qty 180, 90d supply, fill #1
  Filled 2022-02-11: qty 120, 60d supply, fill #2

## 2021-07-08 MED ORDER — INSULIN LISPRO (1 UNIT DIAL) 100 UNIT/ML (KWIKPEN)
15.0000 [IU] | PEN_INJECTOR | Freq: Three times a day (TID) | SUBCUTANEOUS | 6 refills | Status: DC
  Filled 2021-07-08: qty 3, 7d supply, fill #0

## 2021-07-08 NOTE — Telephone Encounter (Signed)
Spoke with the patient about her pantoprazole interaction with starting Epclusa. Due to her decompensated cirrhosis, she will be getting 6 months of treatment with follow ups every month. Next two appointments were also scheduled. Counseled on adherence, drug interactions, and side effects. Assured her she could reach out at any time with additional questions or concerns.  Varney Daily, PharmD PGY1 Pharmacy Resident  Please check AMION for all [facility] pharmacy phone numbers After 10:00 PM call main pharmacy [phone]

## 2021-07-08 NOTE — Telephone Encounter (Signed)
Requested Prescriptions  Pending Prescriptions Disp Refills  . pantoprazole (PROTONIX) 40 MG tablet 180 tablet 3    Sig: Take 1 tablet (40 mg total) by mouth 2 (two) times daily.     Gastroenterology: Proton Pump Inhibitors Passed - 07/06/2021  4:23 PM      Passed - Valid encounter within last 12 months    Recent Outpatient Visits          2 weeks ago Type 2 diabetes mellitus with peripheral neuropathy [facility])   [place], MD   5 months ago Need for zoster vaccination   [place], Stephen L, RPH-CPP   6 months ago Type 2 diabetes mellitus with peripheral neuropathy [facility])   [place], MD   9 months ago Controlled type 2 diabetes mellitus without complication, with long-term current use of insulin [facility])   [place], MD   10 months ago Primary hypertension   [place], Rabun, NP      Future Appointments            In 2 weeks Tresa Endo, [place]   In 2 months Wynetta Emery, Dalbert Batman, MD [place]

## 2021-07-09 ENCOUNTER — Other Ambulatory Visit: Payer: Self-pay

## 2021-07-13 ENCOUNTER — Other Ambulatory Visit: Payer: Self-pay | Admitting: Internal Medicine

## 2021-07-13 DIAGNOSIS — E1142 Type 2 diabetes mellitus with diabetic polyneuropathy: Secondary | ICD-10-CM

## 2021-07-13 MED ORDER — HUMULIN 70/30 KWIKPEN (70-30) 100 UNIT/ML ~~LOC~~ SUPN
PEN_INJECTOR | SUBCUTANEOUS | 11 refills | Status: DC
  Filled 2021-07-13: qty 6, 11d supply, fill #0
  Filled 2021-07-28: qty 6, 11d supply, fill #1
  Filled 2021-08-21: qty 60, 115d supply, fill #1
  Filled 2022-01-06: qty 60, 115d supply, fill #2

## 2021-07-14 ENCOUNTER — Other Ambulatory Visit: Payer: Self-pay

## 2021-07-14 ENCOUNTER — Encounter: Payer: Self-pay | Admitting: Hematology & Oncology

## 2021-07-15 ENCOUNTER — Other Ambulatory Visit: Payer: Self-pay

## 2021-07-16 ENCOUNTER — Inpatient Hospital Stay (HOSPITAL_BASED_OUTPATIENT_CLINIC_OR_DEPARTMENT_OTHER): Payer: Self-pay | Admitting: Hematology & Oncology

## 2021-07-16 ENCOUNTER — Encounter: Payer: Self-pay | Admitting: Hematology & Oncology

## 2021-07-16 ENCOUNTER — Inpatient Hospital Stay: Payer: Self-pay

## 2021-07-16 ENCOUNTER — Inpatient Hospital Stay: Payer: Self-pay | Attending: Hematology & Oncology

## 2021-07-16 ENCOUNTER — Other Ambulatory Visit: Payer: Self-pay

## 2021-07-16 VITALS — BP 127/65 | HR 70 | Temp 97.6°F | Resp 16 | Wt 145.0 lb

## 2021-07-16 DIAGNOSIS — C220 Liver cell carcinoma: Secondary | ICD-10-CM

## 2021-07-16 DIAGNOSIS — Z5112 Encounter for antineoplastic immunotherapy: Secondary | ICD-10-CM | POA: Insufficient documentation

## 2021-07-16 DIAGNOSIS — B1920 Unspecified viral hepatitis C without hepatic coma: Secondary | ICD-10-CM | POA: Insufficient documentation

## 2021-07-16 LAB — CBC WITH DIFFERENTIAL (CANCER CENTER ONLY)
Abs Immature Granulocytes: 0.01 10*3/uL (ref 0.00–0.07)
Basophils Absolute: 0 10*3/uL (ref 0.0–0.1)
Basophils Relative: 1 %
Eosinophils Absolute: 0.1 10*3/uL (ref 0.0–0.5)
Eosinophils Relative: 1 %
HCT: 38.1 % (ref 36.0–46.0)
Hemoglobin: 12.7 g/dL (ref 12.0–15.0)
Immature Granulocytes: 0 %
Lymphocytes Relative: 33 %
Lymphs Abs: 1.4 10*3/uL (ref 0.7–4.0)
MCH: 30.8 pg (ref 26.0–34.0)
MCHC: 33.3 g/dL (ref 30.0–36.0)
MCV: 92.3 fL (ref 80.0–100.0)
Monocytes Absolute: 0.3 10*3/uL (ref 0.1–1.0)
Monocytes Relative: 8 %
Neutro Abs: 2.5 10*3/uL (ref 1.7–7.7)
Neutrophils Relative %: 57 %
Platelet Count: 183 10*3/uL (ref 150–400)
RBC: 4.13 MIL/uL (ref 3.87–5.11)
RDW: 14 % (ref 11.5–15.5)
WBC Count: 4.3 10*3/uL (ref 4.0–10.5)
nRBC: 0 % (ref 0.0–0.2)

## 2021-07-16 LAB — CMP (CANCER CENTER ONLY)
ALT: 34 U/L (ref 0–44)
AST: 29 U/L (ref 15–41)
Albumin: 3.7 g/dL (ref 3.5–5.0)
Alkaline Phosphatase: 178 U/L — ABNORMAL HIGH (ref 38–126)
Anion gap: 6 (ref 5–15)
BUN: 15 mg/dL (ref 8–23)
CO2: 26 mmol/L (ref 22–32)
Calcium: 10.4 mg/dL — ABNORMAL HIGH (ref 8.9–10.3)
Chloride: 107 mmol/L (ref 98–111)
Creatinine: 0.78 mg/dL (ref 0.44–1.00)
GFR, Estimated: >60 mL/min (ref >60)
Glucose, Bld: 171 mg/dL — ABNORMAL HIGH (ref 70–99)
Potassium: 4.1 mmol/L (ref 3.5–5.1)
Sodium: 139 mmol/L (ref 135–145)
Total Bilirubin: 0.5 mg/dL (ref 0.3–1.2)
Total Protein: 7.3 g/dL (ref 6.5–8.1)

## 2021-07-16 LAB — LACTATE DEHYDROGENASE: LDH: 152 U/L (ref 98–192)

## 2021-07-16 MED ORDER — SODIUM CHLORIDE 0.9 % IV SOLN: Freq: Once | INTRAVENOUS | Status: AC

## 2021-07-16 MED ORDER — SODIUM CHLORIDE 0.9% FLUSH
10.0000 mL | INTRAVENOUS | Status: DC | PRN
  Administered 2021-07-16: 10 mL

## 2021-07-16 MED ORDER — HEPARIN SOD (PORK) LOCK FLUSH 100 UNIT/ML IV SOLN
500.0000 [IU] | Freq: Once | INTRAVENOUS | Status: AC | PRN
  Administered 2021-07-16: 500 [IU]

## 2021-07-16 MED ORDER — SODIUM CHLORIDE 0.9 % IV SOLN
240.0000 mg | Freq: Once | INTRAVENOUS | Status: AC
  Administered 2021-07-16: 240 mg via INTRAVENOUS
  Filled 2021-07-16: qty 24

## 2021-07-16 NOTE — Progress Notes (Signed)
Hematology and Oncology Follow Up Visit  [name] [name] [id number] 21-Apr-1945 76 y.o. 07/16/2021   Principle Diagnosis:  Hepatocellular carcinoma-multifocal Hepatitis C   Current Therapy:        Status post cycle 1 of nivolumab/ipilimumab -- d/c on 10/13/2020 due to hepatic toxicity Nivolumab 240 mg IV every 2 weeks - s/p cycle #10 -- started 11/21/2020   Interim History:  [name] is here today for follow-up and treatment.  She looks fantastic.  Her last alpha-fetoprotein was 11.5.  She now is on treatment for Hepatitis C.  Her liver tests have normalized.  This, I would think, would be from the Hepatitis C treatment.  Her blood sugars are still on the high side.  Her endocrinologist is trying to adjust her insulin dosing.  She has had no issues with nausea or vomiting.  There has been no bleeding.  She has had no cough or shortness of breath.  She has had no leg swelling.   Currently, her performance status is ECOG 1.    Medications:  Allergies as of 07/16/2021       Reactions   Amoxicillin Swelling, Rash   Daughter is unsure if patient is allergic to Amoxicillin or Clarithromycin.   Bactrim [sulfamethoxazole-trimethoprim] Swelling   Clarithromycin Swelling, Rash   Daughter is unsure if Clarithromycin or Amoxicillin caused rash and swelling.        Medication List        Accurate as of July 16, 2021  9:10 AM. If you have any questions, ask your nurse or doctor.          brimonidine 0.2 % ophthalmic solution Commonly known as: ALPHAGAN 1 drop 2 (two) times daily.   HumuLIN 70/30 KwikPen (70-30) 100 UNIT/ML KwikPen Generic drug: insulin isophane & regular human KwikPen INJECT 30-40 units subcutANEOUSLY EVERY MORNING and 5-12 units EVERY EVENING   linaclotide 145 MCG Caps capsule Commonly known as: Linzess Take 1 capsule (145 mcg total) by mouth daily before breakfast. Office visit for further refills   losartan 50 MG tablet Commonly  known as: COZAAR Take 1 tablet (50 mg total) by mouth at bedtime.   Na Sulfate-K Sulfate-Mg Sulf 17.5-3.13-1.6 GM/177ML Soln Use as directed   NIFEdipine 30 MG 24 hr tablet Commonly known as: PROCARDIA-XL/NIFEDICAL-XL Take 1 tablet (30 mg total) by mouth in the morning and at bedtime. MUST HAVE OFFICE VISIT FOR REFILLS   pantoprazole 40 MG tablet Commonly known as: Protonix Take 1 tablet (40 mg total) by mouth 2 (two) times daily. What changed: when to take this   polyethylene glycol powder 17 GM/SCOOP powder Commonly known as: GLYCOLAX/MIRALAX Take 17 g by mouth daily as needed.   True Metrix Blood Glucose Test test strip Generic drug: glucose blood Use as instructed   TRUEplus 5-Bevel Pen Needles 31G X 6 MM Misc Generic drug: Insulin Pen Needle use as directed        Allergies:  Allergies  Allergen Reactions   Amoxicillin Swelling and Rash    Daughter is unsure if patient is allergic to Amoxicillin or Clarithromycin.   Bactrim [Sulfamethoxazole-Trimethoprim] Swelling   Clarithromycin Swelling and Rash    Daughter is unsure if Clarithromycin or Amoxicillin caused rash and swelling.    Past Medical History, Surgical history, Social history, and Family History were reviewed and updated.  Review of Systems: Review of Systems  Constitutional: Negative.   HENT: Negative.    Eyes: Negative.   Respiratory: Negative.    Cardiovascular: Negative.  Gastrointestinal: Negative.   Genitourinary: Negative.   Musculoskeletal: Negative.   Skin: Negative.   Neurological:  Positive for tingling.  Endo/Heme/Allergies: Negative.   Psychiatric/Behavioral: Negative.      Physical Exam:  weight is 145 lb (65.8 kg). Her oral temperature is 97.6 F (36.4 C). Her blood pressure is 127/65 and her pulse is 70. Her respiration is 16 and oxygen saturation is 99%.   Wt Readings from Last 3 Encounters:  07/16/21 145 lb (65.8 kg)  07/02/21 141 lb (64 kg)  06/30/21 147 lb (66.7 kg)     Physical Exam Vitals reviewed.  HENT:     Head: Normocephalic and atraumatic.  Eyes:     Pupils: Pupils are equal, round, and reactive to light.  Cardiovascular:     Rate and Rhythm: Normal rate and regular rhythm.     Heart sounds: Normal heart sounds.  Pulmonary:     Effort: Pulmonary effort is normal.     Breath sounds: Normal breath sounds.  Abdominal:     General: Bowel sounds are normal.     Palpations: Abdomen is soft.     Comments: Abdominal exam shows a well-healed laparotomy scar.  She has no fluid wave.  There is no guarding or rebound tenderness to palpation.  There is no palpable liver or spleen tip.  Musculoskeletal:        General: No tenderness or deformity. Normal range of motion.     Cervical back: Normal range of motion.  Lymphadenopathy:     Cervical: No cervical adenopathy.  Skin:    General: Skin is warm and dry.     Findings: No erythema or rash.  Neurological:     Mental Status: She is alert and oriented to person, place, and time.  Psychiatric:        Behavior: Behavior normal.        Thought Content: Thought content normal.        Judgment: Judgment normal.      Lab Results  Component Value Date   WBC 4.3 07/16/2021   HGB 12.7 07/16/2021   HCT 38.1 07/16/2021   MCV 92.3 07/16/2021   PLT 183 07/16/2021   Lab Results  Component Value Date   FERRITIN 603 (H) 10/02/2020   IRON 31 (L) 10/02/2020   TIBC 241 10/02/2020   UIBC 211 10/02/2020   IRONPCTSAT 13 (L) 10/02/2020   Lab Results  Component Value Date   RBC 4.13 07/16/2021   No results found for: KPAFRELGTCHN, LAMBDASER, KAPLAMBRATIO No results found for: IGGSERUM, IGA, IGMSERUM No results found for: Odetta Pink, SPEI   Chemistry      Component Value Date/Time   NA 139 07/16/2021 0742   K 4.1 07/16/2021 0742   CL 107 07/16/2021 0742   CO2 26 07/16/2021 0742   BUN 15 07/16/2021 0742   CREATININE 0.78 07/16/2021 0742       Component Value Date/Time   CALCIUM 10.4 (H) 07/16/2021 0742   ALKPHOS 178 (H) 07/16/2021 0742   AST 29 07/16/2021 0742   ALT 34 07/16/2021 0742   BILITOT 0.5 07/16/2021 0742       Impression and Plan: [name] is a very pleasant 76 yo female from [place] with hepatocellular carcinoma and Hepatitis C.   So far, she is on incredibly well with the nivolumab.  We will have to check her LFTs.  It be nice for her to get on treatment for the Hepatitis C.  Hopefully, the improved liver function studies are because of the Hepatitis Seek treatment.  We will treat her with a 240 mg dose now.  We will see her back in 2 weeks, we will then move her to once a month therapy.  We will increase the dose to 480 mg.  I think she can do once a month therapy now.     Volanda Napoleon, MD 6/1/20239:10 AM

## 2021-07-16 NOTE — Patient Instructions (Signed)
[facility]  Discharge Instructions: Thank you for choosing [facility] to provide your oncology and hematology care.   If you have a lab appointment with the Cancer Center, please go directly to the Cancer Center and check in at the registration area.  Wear comfortable clothing and clothing appropriate for easy access to any Portacath or PICC line.   We strive to give you quality time with your provider. You may need to reschedule your appointment if you arrive late (15 or more minutes).  Arriving late affects you and other patients whose appointments are after yours.  Also, if you miss three or more appointments without notifying the office, you may be dismissed from the clinic at the provider's discretion.      For prescription refill requests, have your pharmacy contact our office and allow 72 hours for refills to be completed.    Today you received the following chemotherapy and/or immunotherapy agents:  Opdivo      To help prevent nausea and vomiting after your treatment, we encourage you to take your nausea medication as directed.  BELOW ARE SYMPTOMS THAT SHOULD BE REPORTED IMMEDIATELY: *FEVER GREATER THAN 100.4 F (38 C) OR HIGHER *CHILLS OR SWEATING *NAUSEA AND VOMITING THAT IS NOT CONTROLLED WITH YOUR NAUSEA MEDICATION *UNUSUAL SHORTNESS OF BREATH *UNUSUAL BRUISING OR BLEEDING *URINARY PROBLEMS (pain or burning when urinating, or frequent urination) *BOWEL PROBLEMS (unusual diarrhea, constipation, pain near the anus) TENDERNESS IN MOUTH AND THROAT WITH OR WITHOUT PRESENCE OF ULCERS (sore throat, sores in mouth, or a toothache) UNUSUAL RASH, SWELLING OR PAIN  UNUSUAL VAGINAL DISCHARGE OR ITCHING   Items with * indicate a potential emergency and should be followed up as soon as possible or go to the Emergency Department if any problems should occur.  Please show the CHEMOTHERAPY ALERT CARD or IMMUNOTHERAPY ALERT CARD at check-in to the  Emergency Department and triage nurse. Should you have questions after your visit or need to cancel or reschedule your appointment, please contact [facility]  [phone] and follow the prompts.  Office hours are 8:00 a.m. to 4:30 p.m. Monday - Friday. Please note that voicemails left after 4:00 p.m. may not be returned until the following business day.  We are closed weekends and major holidays. You have access to a nurse at all times for urgent questions. Please call the main number to the clinic [phone] and follow the prompts.  For any non-urgent questions, you may also contact your provider using MyChart. We now offer e-Visits for anyone 18 and older to request care online for non-urgent symptoms. For details visit [email or web address].   Also download the MyChart app! Go to the app store, search "MyChart", open the app, select [facility], and log in with your MyChart username and password.  Due to Covid, a mask is required upon entering the hospital/clinic. If you do not have a mask, one will be given to you upon arrival. For doctor visits, patients may have 1 support person aged 18 or older with them. For treatment visits, patients cannot have anyone with them due to current Covid guidelines and our immunocompromised population.  

## 2021-07-16 NOTE — Patient Instructions (Signed)

## 2021-07-17 ENCOUNTER — Other Ambulatory Visit: Payer: Self-pay

## 2021-07-17 LAB — AFP TUMOR MARKER: AFP, Serum, Tumor Marker: 11.7 ng/mL — ABNORMAL HIGH (ref 0.0–9.2)

## 2021-07-23 ENCOUNTER — Ambulatory Visit: Payer: Self-pay | Admitting: Pharmacist

## 2021-07-23 ENCOUNTER — Telehealth: Payer: Self-pay

## 2021-07-23 NOTE — Telephone Encounter (Signed)
[facility] Patient Advocate Encounter  Patient's medications have been couriered to [facility] from [employer] and will be picked up 08/06/21.  2nd box of [place] , [place] Patient [facility] for [facility] Phone: [phone] Fax:  [phone]

## 2021-07-28 ENCOUNTER — Other Ambulatory Visit: Payer: Self-pay | Admitting: Pharmacist

## 2021-07-28 ENCOUNTER — Ambulatory Visit: Payer: Self-pay | Attending: Family Medicine | Admitting: Family Medicine

## 2021-07-28 ENCOUNTER — Encounter: Payer: Self-pay | Admitting: Hematology & Oncology

## 2021-07-28 ENCOUNTER — Encounter: Payer: Self-pay | Admitting: Family Medicine

## 2021-07-28 ENCOUNTER — Other Ambulatory Visit: Payer: Self-pay

## 2021-07-28 VITALS — BP 152/79 | HR 97 | Temp 98.7°F | Ht 64.0 in | Wt 150.8 lb

## 2021-07-28 DIAGNOSIS — B353 Tinea pedis: Secondary | ICD-10-CM

## 2021-07-28 DIAGNOSIS — E1142 Type 2 diabetes mellitus with diabetic polyneuropathy: Secondary | ICD-10-CM

## 2021-07-28 MED ORDER — INSULIN NPH ISOPHANE & REGULAR (70-30) 100 UNIT/ML ~~LOC~~ SUSP
SUBCUTANEOUS | 0 refills | Status: DC
  Filled 2021-07-28: qty 10, 20d supply, fill #0

## 2021-07-28 MED ORDER — TERBINAFINE HCL 250 MG PO TABS
250.0000 mg | ORAL_TABLET | Freq: Every day | ORAL | 1 refills | Status: DC
  Filled 2021-07-28: qty 30, 30d supply, fill #0

## 2021-07-28 MED ORDER — GABAPENTIN 300 MG PO CAPS
300.0000 mg | ORAL_CAPSULE | Freq: Every day | ORAL | 3 refills | Status: DC
  Filled 2021-07-28: qty 30, 30d supply, fill #0
  Filled 2021-09-02: qty 30, 30d supply, fill #1
  Filled 2021-10-01: qty 30, 30d supply, fill #2
  Filled 2021-10-30 – 2021-11-02 (×2): qty 30, 30d supply, fill #3

## 2021-07-28 NOTE — Progress Notes (Signed)
Subjective:  Patient ID: [name] [name], female    DOB: 1945/12/19  Age: 76 y.o. MRN: [id number]  CC: Rash   HPI [name] [name] is a 76 y.o. year old female with a history of hypertension, H/o colon ca s/p surgery and chemo in [place], type 2 diabetes mellitus, hepatitis C, hepatocellular carcinoma who presents today for an acute visit. Last visit with PCP for chronic disease management was last month.  Interval History: She complains of burning in her feet as well as presence of sores. 2weeks ago she developed a rash in her feet which started out as little itchy and painful bumps. Itching occurs around the lesions and two of the lesions have become sores with scabs one on each foot. She has no lesions on her hands, no lesions on her mouth or other body parts and denies a history of fever. Complains of burning in her feet was present previously but has now worsened. 2 weeks ago she was started on Hepclusa and is unsure if this is related to the hip glucide. Accompanied by her daughter to today's visit. Past Medical History:  Diagnosis Date   Colon cancer ([place])    Diabetes mellitus without complication ([place])    Glaucoma    Goals of care, counseling/discussion 09/01/2020   Hepatitis C    Hepatocellular carcinoma ([place])    Hypertension     Past Surgical History:  Procedure Laterality Date   COLON RESECTION  2005   In [place] - ?colon cancer   IR IMAGING GUIDED PORT INSERTION  09/18/2020   LAPAROSCOPIC GASTRIC RESECTION N/A 08/09/2020   Procedure: LAPAROSCOPIC EXPLORATION OF ABDOMEN, PERFORATED ULCER REPAIR, LIVER BIOPSY;  Surgeon: Michael Boston, MD;  Location: [facility];  Service: General;  Laterality: N/A;    Family History  Problem Relation Age of Onset   Cancer Father        type unknown, was on the leg   Other Daughter        Acoustic neuroma   Breast cancer Neg Hx    Colon cancer Neg Hx    Esophageal cancer Neg Hx    Rectal cancer Neg Hx    Stomach  cancer Neg Hx     Social History   Socioeconomic History   Marital status: Widowed    Spouse name: Not on file   Number of children: 5   Years of education: 14   Highest education level: Associate degree: occupational, Hotel manager, or vocational program  Occupational History   Not on file  Tobacco Use   Smoking status: Never   Smokeless tobacco: Never  Vaping Use   Vaping Use: Never used  Substance and Sexual Activity   Alcohol use: Not Currently   Drug use: Never   Sexual activity: Not Currently  Other Topics Concern   Not on file  Social History Narrative   Originally from [place].  Speaks [place] and [place]   Social Determinants of Radio broadcast assistant Strain: Not on [employer] Insecurity: Not on file  Transportation Needs: Not on file  Physical Activity: Not on file  Stress: Not on file  Social Connections: Not on file    Allergies  Allergen Reactions   Amoxicillin Swelling and Rash    Daughter is unsure if patient is allergic to Amoxicillin or Clarithromycin.   Bactrim [Sulfamethoxazole-Trimethoprim] Swelling   Clarithromycin Swelling and Rash    Daughter is unsure if Clarithromycin or Amoxicillin caused rash and swelling.    Outpatient Medications  Prior to Visit  Medication Sig Dispense Refill   brimonidine (ALPHAGAN) 0.2 % ophthalmic solution 1 drop 2 (two) times daily.     glucose blood (TRUE METRIX BLOOD GLUCOSE TEST) test strip Use as instructed 100 each 12   insulin isophane & regular human KwikPen (HUMULIN 70/30 KWIKPEN) (70-30) 100 UNIT/ML KwikPen INJECT 30-40 units subcutANEOUSLY EVERY MORNING and 5-12 units EVERY EVENING 15 mL 11   insulin NPH-regular Human (70-30) 100 UNIT/ML injection Inject 30-40 units into the skin once every morning and 5-12 units once every evening. 10 mL 0   Insulin Pen Needle 31G X 6 MM MISC use as directed 100 each 6   linaclotide (LINZESS) 145 MCG CAPS capsule Take 1 capsule (145 mcg total) by mouth daily before  breakfast. Office visit for further refills 30 capsule 2   losartan (COZAAR) 50 MG tablet Take 1 tablet (50 mg total) by mouth at bedtime. 30 tablet 6   Na Sulfate-K Sulfate-Mg Sulf 17.5-3.13-1.6 GM/177ML SOLN Use as directed 354 mL 0   NIFEdipine (PROCARDIA-XL/NIFEDICAL-XL) 30 MG 24 hr tablet Take 1 tablet (30 mg total) by mouth in the morning and at bedtime. MUST HAVE OFFICE VISIT FOR REFILLS 60 tablet 2   pantoprazole (PROTONIX) 40 MG tablet Take 1 tablet (40 mg total) by mouth 2 (two) times daily. (Patient taking differently: Take 40 mg by mouth daily.) 180 tablet 3   polyethylene glycol powder (GLYCOLAX/MIRALAX) 17 GM/SCOOP powder Take 17 g by mouth daily as needed.     Facility-Administered Medications Prior to Visit  Medication Dose Route Frequency Provider Last Rate Last Admin   sodium chloride flush (NS) 0.9 % injection 10 mL  10 mL Intravenous PRN Celso Amy, NP   10 mL at 12/18/20 1009     ROS Review of Systems  Constitutional:  Negative for activity change and appetite change.  HENT:  Negative for sinus pressure and sore throat.   Respiratory:  Negative for chest tightness, shortness of breath and wheezing.   Cardiovascular:  Negative for chest pain and palpitations.  Gastrointestinal:  Negative for abdominal distention, abdominal pain and constipation.  Genitourinary: Negative.   Musculoskeletal: Negative.   Skin:  Positive for rash.  Psychiatric/Behavioral:  Negative for behavioral problems and dysphoric mood.     Objective:  BP (!) 152/79   Pulse 97   Temp 98.7 F (37.1 C) (Oral)   Ht '5\' 4"'$  (1.626 m)   Wt 150 lb 12.8 oz (68.4 kg)   SpO2 96%   BMI 25.88 kg/m      07/28/2021    3:45 PM 07/16/2021    8:24 AM 07/02/2021    8:00 AM  BP/Weight  Systolic BP 027 741 97  Diastolic BP 79 65 46  Wt. (Lbs) 150.8 145 141  BMI 25.88 kg/m2 24.89 kg/m2 24.2 kg/m2      Physical Exam Constitutional:      Appearance: She is well-developed.  Cardiovascular:     Rate  and Rhythm: Normal rate.     Heart sounds: Normal heart sounds. No murmur heard. Pulmonary:     Effort: Pulmonary effort is normal.     Breath sounds: Normal breath sounds. No wheezing or rales.  Chest:     Chest wall: No tenderness.  Abdominal:     General: Bowel sounds are normal. There is no distension.     Palpations: Abdomen is soft. There is no mass.     Tenderness: There is no abdominal tenderness.  Musculoskeletal:  General: Normal range of motion.     Right lower leg: No edema.     Left lower leg: No edema.  Skin:    Comments: Bilateral feet with diffusely distributed subcutaneous nodules which are normopigmented.  Ulcerated lesions with yellowish base and scabs 1 on lateral aspect of right foot and second heel of left foot.  Associated hyperpigmented patches on bilateral lower extremities which are chronic per patient. No lesions on hands or mouth  Neurological:     Mental Status: She is alert and oriented to person, place, and time.  Psychiatric:        Mood and Affect: Mood normal.        Latest Ref Rng & Units 07/16/2021    7:42 AM 07/02/2021    7:44 AM 06/18/2021    8:18 AM  CMP  Glucose 70 - 99 mg/dL 171  108  138   BUN 8 - 23 mg/dL '15  15  16   '$ Creatinine 0.44 - 1.00 mg/dL 0.78  0.69  0.74   Sodium 135 - 145 mmol/L 139  139  137   Potassium 3.5 - 5.1 mmol/L 4.1  3.5  4.1   Chloride 98 - 111 mmol/L 107  106  105   CO2 22 - 32 mmol/L '26  27  27   '$ Calcium 8.9 - 10.3 mg/dL 10.4  10.1  10.0   Total Protein 6.5 - 8.1 g/dL 7.3  7.2  7.2   Total Bilirubin 0.3 - 1.2 mg/dL 0.5  0.9  0.9   Alkaline Phos 38 - 126 U/L 178  212  213   AST 15 - 41 U/L 29  52  53   ALT 0 - 44 U/L 34  61  56     Lipid Panel  No results found for: "CHOL", "TRIG", "HDL", "CHOLHDL", "VLDL", "LDLCALC", "LDLDIRECT"  CBC    Component Value Date/Time   WBC 4.3 07/16/2021 0742   WBC 9.8 08/16/2020 0434   RBC 4.13 07/16/2021 0742   HGB 12.7 07/16/2021 0742   HCT 38.1 07/16/2021 0742    PLT 183 07/16/2021 0742   MCV 92.3 07/16/2021 0742   MCH 30.8 07/16/2021 0742   MCHC 33.3 07/16/2021 0742   RDW 14.0 07/16/2021 0742   LYMPHSABS 1.4 07/16/2021 0742   MONOABS 0.3 07/16/2021 0742   EOSABS 0.1 07/16/2021 0742   BASOSABS 0.0 07/16/2021 0742    Lab Results  Component Value Date   HGBA1C 7.3 (A) 06/19/2021    Assessment & Plan:  1. Diabetic polyneuropathy associated with type 2 diabetes mellitus ([place]) Uncontrolled We will initiate gabapentin We have discussed adverse effects that she needs to take it at night - gabapentin (NEURONTIN) 300 MG capsule; Take 1 capsule (300 mg total) by mouth once nightly at bedtime.  Dispense: 30 capsule; Refill: 3  2. Tinea pedis of both feet Skin care discussed including contagious nature-she has been advised to use gloves on her hands when scratching her feet to avoid transmission to her hands Counseled on hepatotoxic adverse effects of Lamisil and she will need monitoring.  Scheduled for blood work with the cancer center every 2 weeks At this time she has slightly elevated alkaline phosphatase and we will need to exercise caution. Advised that if symptoms do not improve she needs to notify the clinic and we will refer her to dermatology Unclear if this is related to recent initiation of Epclusa as I would expect her lesions to be generalized rather than limited  to the feet if this is a drug reaction - terbinafine (LAMISIL) 250 MG tablet; Take 1 tablet (250 mg total) by mouth once daily.  Dispense: 30 tablet; Refill: 1   Meds ordered this encounter  Medications   gabapentin (NEURONTIN) 300 MG capsule    Sig: Take 1 capsule (300 mg total) by mouth once nightly at bedtime.    Dispense:  30 capsule    Refill:  3   terbinafine (LAMISIL) 250 MG tablet    Sig: Take 1 tablet (250 mg total) by mouth once daily.    Dispense:  30 tablet    Refill:  1    Follow-up: Return if symptoms worsen or fail to improve, for follow up, keep  previously scheduled appointment with PCP.[name]       Charlott Rakes, MD, FAAFP. [facility] [place], [place]   07/29/2021, 8:44 AM

## 2021-07-28 NOTE — Patient Instructions (Addendum)
Please obtain Bacitracin over-the-counter for the open sores.  Athlete's Foot  Athlete's foot (tinea pedis) is a fungal infection of the skin on your feet. It often occurs on the skin that is between or underneath your toes. It can also occur on the soles of your feet. Symptoms include itchy or white and flaky areas on the skin. The infection can spread from person to person (is contagious). It can also spread when a person's bare feet come in contact with the fungus on shower floors or on items such as shoes. Follow these instructions at home: Medicines Apply or take over-the-counter and prescription medicines only as told by your doctor. Apply your antifungal medicine as told by your doctor. Do not stop using it even if your feet start to get better. Foot care Do not scratch your feet. Keep your feet dry: Wear cotton or wool socks. Change your socks every day or if they become wet. Wear shoes that allow air to move around, such as sandals or canvas tennis shoes. Wash and dry your feet: Every day or as told by your doctor. After exercising. Including the area between your toes. General instructions Do not share any of these items that touch your feet: Towels. Shoes. Nail clippers. Other personal items. Protect your feet by wearing sandals in wet areas, such as locker rooms and shared showers. Keep all follow-up visits. If you have diabetes, keep your blood sugar under control. Contact a doctor if: You have a fever. You have swelling, pain, warmth, or redness in your foot. Your feet are not getting better with treatment. Your symptoms get worse. You have new symptoms. You have very bad pain. Summary Athlete's foot is a fungal infection of the skin on your feet. This condition is caused by a fungus that grows in warm, moist places. Symptoms include itchy or white and flaky areas on the skin. Apply your antifungal medicine as told by your doctor. Keep your feet clean and  dry. This information is not intended to replace advice given to you by your health care provider. Make sure you discuss any questions you have with your health care provider. Document Revised: 05/25/2020 Document Reviewed: 05/25/2020 Elsevier Patient Education  [place].

## 2021-07-28 NOTE — Progress Notes (Unsigned)
Sores on both feet Burning in toes

## 2021-07-29 ENCOUNTER — Ambulatory Visit: Payer: Self-pay

## 2021-07-29 ENCOUNTER — Inpatient Hospital Stay: Payer: Self-pay

## 2021-07-29 ENCOUNTER — Other Ambulatory Visit: Payer: Self-pay | Admitting: Lab

## 2021-07-29 ENCOUNTER — Encounter: Payer: Self-pay | Admitting: Family Medicine

## 2021-07-29 ENCOUNTER — Encounter: Payer: Self-pay | Admitting: Family

## 2021-07-29 ENCOUNTER — Other Ambulatory Visit: Payer: Self-pay

## 2021-07-29 ENCOUNTER — Encounter: Payer: Self-pay | Admitting: Internal Medicine

## 2021-07-29 ENCOUNTER — Inpatient Hospital Stay (HOSPITAL_BASED_OUTPATIENT_CLINIC_OR_DEPARTMENT_OTHER): Payer: Self-pay | Admitting: Family

## 2021-07-29 ENCOUNTER — Telehealth: Payer: Self-pay

## 2021-07-29 ENCOUNTER — Ambulatory Visit: Payer: Self-pay | Admitting: Family

## 2021-07-29 VITALS — BP 130/56 | HR 83 | Temp 97.6°F | Resp 17 | Ht 64.0 in | Wt 150.0 lb

## 2021-07-29 DIAGNOSIS — C220 Liver cell carcinoma: Secondary | ICD-10-CM

## 2021-07-29 DIAGNOSIS — E032 Hypothyroidism due to medicaments and other exogenous substances: Secondary | ICD-10-CM

## 2021-07-29 DIAGNOSIS — B182 Chronic viral hepatitis C: Secondary | ICD-10-CM

## 2021-07-29 LAB — CBC WITH DIFFERENTIAL (CANCER CENTER ONLY)
Abs Immature Granulocytes: 0.01 10*3/uL (ref 0.00–0.07)
Basophils Absolute: 0 10*3/uL (ref 0.0–0.1)
Basophils Relative: 1 %
Eosinophils Absolute: 0.1 10*3/uL (ref 0.0–0.5)
Eosinophils Relative: 1 %
HCT: 37.9 % (ref 36.0–46.0)
Hemoglobin: 12.6 g/dL (ref 12.0–15.0)
Immature Granulocytes: 0 %
Lymphocytes Relative: 35 %
Lymphs Abs: 1.8 10*3/uL (ref 0.7–4.0)
MCH: 30.9 pg (ref 26.0–34.0)
MCHC: 33.2 g/dL (ref 30.0–36.0)
MCV: 92.9 fL (ref 80.0–100.0)
Monocytes Absolute: 0.3 10*3/uL (ref 0.1–1.0)
Monocytes Relative: 6 %
Neutro Abs: 2.9 10*3/uL (ref 1.7–7.7)
Neutrophils Relative %: 57 %
Platelet Count: 182 10*3/uL (ref 150–400)
RBC: 4.08 MIL/uL (ref 3.87–5.11)
RDW: 14.6 % (ref 11.5–15.5)
WBC Count: 5.1 10*3/uL (ref 4.0–10.5)
nRBC: 0 % (ref 0.0–0.2)

## 2021-07-29 LAB — CMP (CANCER CENTER ONLY)
ALT: 37 U/L (ref 0–44)
AST: 30 U/L (ref 15–41)
Albumin: 3.9 g/dL (ref 3.5–5.0)
Alkaline Phosphatase: 183 U/L — ABNORMAL HIGH (ref 38–126)
Anion gap: 6 (ref 5–15)
BUN: 20 mg/dL (ref 8–23)
CO2: 27 mmol/L (ref 22–32)
Calcium: 10.4 mg/dL — ABNORMAL HIGH (ref 8.9–10.3)
Chloride: 103 mmol/L (ref 98–111)
Creatinine: 0.75 mg/dL (ref 0.44–1.00)
GFR, Estimated: >60 mL/min (ref >60)
Glucose, Bld: 112 mg/dL — ABNORMAL HIGH (ref 70–99)
Potassium: 4 mmol/L (ref 3.5–5.1)
Sodium: 136 mmol/L (ref 135–145)
Total Bilirubin: 0.7 mg/dL (ref 0.3–1.2)
Total Protein: 7.4 g/dL (ref 6.5–8.1)

## 2021-07-29 LAB — LACTATE DEHYDROGENASE: LDH: 160 U/L (ref 98–192)

## 2021-07-29 MED ORDER — SODIUM CHLORIDE 0.9 % IV SOLN
480.0000 mg | Freq: Once | INTRAVENOUS | Status: AC
  Administered 2021-07-29: 480 mg via INTRAVENOUS
  Filled 2021-07-29: qty 48

## 2021-07-29 MED ORDER — HEPARIN SOD (PORK) LOCK FLUSH 100 UNIT/ML IV SOLN
500.0000 [IU] | Freq: Once | INTRAVENOUS | Status: AC | PRN
  Administered 2021-07-29: 500 [IU]

## 2021-07-29 MED ORDER — SODIUM CHLORIDE 0.9 % IV SOLN: Freq: Once | INTRAVENOUS | Status: AC

## 2021-07-29 MED ORDER — SODIUM CHLORIDE 0.9% FLUSH
10.0000 mL | INTRAVENOUS | Status: DC | PRN
  Administered 2021-07-29: 10 mL

## 2021-07-29 NOTE — Patient Instructions (Signed)
Nivolumab injection What is this medication? NIVOLUMAB (nye VOL ue mab) is a monoclonal antibody. It treats certain types of cancer. Some of the cancers treated are colon cancer, head and neck cancer, Hodgkin lymphoma, lung cancer, and melanoma. This medicine may be used for other purposes; ask your health care provider or pharmacist if you have questions. COMMON BRAND NAME(S): Opdivo What should I tell my care team before I take this medication? They need to know if you have any of these conditions: Autoimmune diseases such as Crohn's disease, ulcerative colitis, or lupus Have had or planning to have an allogeneic stem cell transplant (uses someone else's stem cells) History of chest radiation Organ transplant Nervous system problems such as myasthenia gravis or Guillain-Barre syndrome An unusual or allergic reaction to nivolumab, other medicines, foods, dyes, or preservatives Pregnant or trying to get pregnant Breast-feeding How should I use this medication? This medication is injected into a vein. It is given in a hospital or clinic setting. A special MedGuide will be given to you before each treatment. Be sure to read this information carefully each time. Talk to your care team regarding the use of this medication in children. While it may be prescribed for children as young as 12 years for selected conditions, precautions do apply. Overdosage: If you think you have taken too much of this medicine contact a poison control center or emergency room at once. NOTE: This medicine is only for you. Do not share this medicine with others. What if I miss a dose? Keep appointments for follow-up doses. It is important not to miss your dose. Call your care team if you are unable to keep an appointment. What may interact with this medication? Interactions have not been studied. This list may not describe all possible interactions. Give your health care provider a list of all the medicines, herbs,  non-prescription drugs, or dietary supplements you use. Also tell them if you smoke, drink alcohol, or use illegal drugs. Some items may interact with your medicine. What should I watch for while using this medication? Your condition will be monitored carefully while you are receiving this medication. You may need blood work done while you are taking this medication. Do not become pregnant while taking this medication or for 5 months after stopping it. Women should inform their care team if they wish to become pregnant or think they might be pregnant. There is a potential for serious harm to an unborn child. Talk to your care team for more information. Do not breast-feed an infant while taking this medication or for 5 months after stopping it. What side effects may I notice from receiving this medication? Side effects that you should report to your care team as soon as possible: Allergic reactions--skin rash, itching, hives, swelling of the face, lips, tongue, or throat Bloody or black, tar-like stools Change in vision Chest pain Diarrhea Dry cough, shortness of breath or trouble breathing Eye pain Fast or irregular heartbeat Fever, chills High blood sugar (hyperglycemia)--increased thirst or amount of urine, unusual weakness or fatigue, blurry vision High thyroid levels (hyperthyroidism)--fast or irregular heartbeat, weight loss, excessive sweating or sensitivity to heat, tremors or shaking, anxiety, nervousness, irregular menstrual cycle or spotting Kidney injury--decrease in the amount of urine, swelling of the ankles, hands, or feet Liver injury--right upper belly pain, loss of appetite, nausea, light-colored stool, dark yellow or brown urine, yellowing skin or eyes, unusual weakness or fatigue Low red blood cell count--unusual weakness or fatigue, dizziness, headache, trouble breathing   Low thyroid levels (hypothyroidism)--unusual weakness or fatigue, increased sensitivity to cold,  constipation, hair loss, dry skin, weight gain, feelings of depression Mood and behavior changes-confusion, change in sex drive or performance, irritability Muscle pain or cramps Pain, tingling, or numbness in the hands or feet, muscle weakness, trouble walking, loss of balance or coordination Red or dark brown urine Redness, blistering, peeling, or loosening of the skin, including inside the mouth Stomach pain Unusual bruising or bleeding Side effects that usually do not require medical attention (report to your care team if they continue or are bothersome): Bone pain Constipation Loss of appetite Nausea Tiredness Vomiting This list may not describe all possible side effects. Call your doctor for medical advice about side effects. You may report side effects to FDA at 1-800-FDA-1088. Where should I keep my medication? This medication is given in a hospital or clinic and will not be stored at home. NOTE: This sheet is a summary. It may not cover all possible information. If you have questions about this medicine, talk to your doctor, pharmacist, or health care provider.  2023 Elsevier/Gold Standard (2021-01-02 00:00:00)  

## 2021-07-29 NOTE — Telephone Encounter (Signed)
Completed forms faxed to [facility] patient assistance for Opdivo. dph

## 2021-07-29 NOTE — Patient Instructions (Signed)

## 2021-07-29 NOTE — Progress Notes (Signed)
Hematology and Oncology Follow Up Visit  [name] [name] [id number] 17-Feb-1945 76 y.o. 07/29/2021   Principle Diagnosis:  Hepatocellular carcinoma-multifocal Hepatitis C   Current Therapy:        Status post cycle 1 of nivolumab/ipilimumab -- d/c on 10/13/2020 due to hepatic toxicity Nivolumab 240 mg IV every 2 weeks - s/p cycle 16 -- started 11/21/2020   Interim History:  [name] [name] is here today with her daughter for follow-up and treatment.  She is currently on Epcluza for Hep C and tolerating nicely.  She does have patchy dry lesions on both feet which she states her PCP feels are fungal. She was started on Lamisil PO yesterday.  No pain, swelling or drainage noted.  Neuropathy in her lower extremities seems a little more intense but feels this may be due to recent increase in her blood glucose levels.  No fever, chills, n/v, cough, dizziness, SOB, chest pain, palpitations, abdominal pain or changes in bowel or bladder habits.  No blood loss, bruising or petechiae.  No falls or syncope to report.  Appetite and hydration are good. Her weight is stable at 150 lbs.   ECOG Performance Status: 1 - Symptomatic but completely ambulatory  Medications:  Allergies as of 07/29/2021       Reactions   Amoxicillin Swelling, Rash   Daughter is unsure if patient is allergic to Amoxicillin or Clarithromycin.   Bactrim [sulfamethoxazole-trimethoprim] Swelling   Clarithromycin Swelling, Rash   Daughter is unsure if Clarithromycin or Amoxicillin caused rash and swelling.        Medication List        Accurate as of July 29, 2021 10:46 AM. If you have any questions, ask your nurse or doctor.          brimonidine 0.2 % ophthalmic solution Commonly known as: ALPHAGAN 1 drop 2 (two) times daily.   gabapentin 300 MG capsule Commonly known as: NEURONTIN Take 1 capsule (300 mg total) by mouth once nightly at bedtime.   HumuLIN 70/30 KwikPen (70-30) 100 UNIT/ML  KwikPen Generic drug: insulin isophane & regular human KwikPen INJECT 30-40 units subcutANEOUSLY EVERY MORNING and 5-12 units EVERY EVENING   HumuLIN 70/30 (70-30) 100 UNIT/ML injection Generic drug: insulin NPH-regular Human Inject 30-40 units into the skin once every morning and 5-12 units once every evening.   linaclotide 145 MCG Caps capsule Commonly known as: Linzess Take 1 capsule (145 mcg total) by mouth daily before breakfast. Office visit for further refills   losartan 50 MG tablet Commonly known as: COZAAR Take 1 tablet (50 mg total) by mouth at bedtime.   Na Sulfate-K Sulfate-Mg Sulf 17.5-3.13-1.6 GM/177ML Soln Use as directed   NIFEdipine 30 MG 24 hr tablet Commonly known as: PROCARDIA-XL/NIFEDICAL-XL Take 1 tablet (30 mg total) by mouth in the morning and at bedtime. MUST HAVE OFFICE VISIT FOR REFILLS   pantoprazole 40 MG tablet Commonly known as: Protonix Take 1 tablet (40 mg total) by mouth 2 (two) times daily. What changed: when to take this   polyethylene glycol powder 17 GM/SCOOP powder Commonly known as: GLYCOLAX/MIRALAX Take 17 g by mouth daily as needed.   terbinafine 250 MG tablet Commonly known as: LAMISIL Take 1 tablet (250 mg total) by mouth once daily.   True Metrix Blood Glucose Test test strip Generic drug: glucose blood Use as instructed   TRUEplus 5-Bevel Pen Needles 31G X 6 MM Misc Generic drug: Insulin Pen Needle use as directed  Allergies:  Allergies  Allergen Reactions   Amoxicillin Swelling and Rash    Daughter is unsure if patient is allergic to Amoxicillin or Clarithromycin.   Bactrim [Sulfamethoxazole-Trimethoprim] Swelling   Clarithromycin Swelling and Rash    Daughter is unsure if Clarithromycin or Amoxicillin caused rash and swelling.    Past Medical History, Surgical history, Social history, and Family History were reviewed and updated.  Review of Systems: All other 10 point review of systems is negative.    Physical Exam:  height is '5\' 4"'$  (1.626 m) and weight is 150 lb (68 kg). Her oral temperature is 97.6 F (36.4 C). Her blood pressure is 130/56 (abnormal) and her pulse is 83. Her respiration is 17 and oxygen saturation is 100%.   Wt Readings from Last 3 Encounters:  07/29/21 150 lb (68 kg)  07/28/21 150 lb 12.8 oz (68.4 kg)  07/16/21 145 lb (65.8 kg)    Ocular: Sclerae unicteric, pupils equal, round and reactive to light Ear-nose-throat: Oropharynx clear, dentition fair Lymphatic: No cervical or supraclavicular adenopathy Lungs no rales or rhonchi, good excursion bilaterally Heart regular rate and rhythm, no murmur appreciated Abd soft, nontender, positive bowel sounds MSK no focal spinal tenderness, no joint edema Neuro: non-focal, well-oriented, appropriate affect Breasts: Deferred   Lab Results  Component Value Date   WBC 5.1 07/29/2021   HGB 12.6 07/29/2021   HCT 37.9 07/29/2021   MCV 92.9 07/29/2021   PLT 182 07/29/2021   Lab Results  Component Value Date   FERRITIN 603 (H) 10/02/2020   IRON 31 (L) 10/02/2020   TIBC 241 10/02/2020   UIBC 211 10/02/2020   IRONPCTSAT 13 (L) 10/02/2020   Lab Results  Component Value Date   RBC 4.08 07/29/2021   No results found for: "KPAFRELGTCHN", "LAMBDASER", "KAPLAMBRATIO" No results found for: "IGGSERUM", "IGA", "IGMSERUM" No results found for: "TOTALPROTELP", "ALBUMINELP", "A1GS", "A2GS", "BETS", "BETA2SER", "GAMS", "MSPIKE", "SPEI"   Chemistry      Component Value Date/Time   NA 139 07/16/2021 0742   K 4.1 07/16/2021 0742   CL 107 07/16/2021 0742   CO2 26 07/16/2021 0742   BUN 15 07/16/2021 0742   CREATININE 0.78 07/16/2021 0742      Component Value Date/Time   CALCIUM 10.4 (H) 07/16/2021 0742   ALKPHOS 178 (H) 07/16/2021 0742   AST 29 07/16/2021 0742   ALT 34 07/16/2021 0742   BILITOT 0.5 07/16/2021 0742       Impression and Plan: [name] [name] is a very pleasant 76 yo female from [place] with  hepatocellular carcinoma and Hepatitis C.  We will proceed with treatment today as planned.  AFP last visit was 11.7. Today's result is pending.  We will now go to monthly follow-up and treatment.   Lottie Dawson, NP 6/14/202310:46 AM

## 2021-07-30 ENCOUNTER — Other Ambulatory Visit: Payer: Self-pay

## 2021-07-30 ENCOUNTER — Other Ambulatory Visit: Payer: Self-pay | Admitting: *Deleted

## 2021-07-30 ENCOUNTER — Encounter: Payer: Self-pay | Admitting: Gastroenterology

## 2021-07-30 ENCOUNTER — Encounter: Payer: Self-pay | Admitting: Hematology & Oncology

## 2021-07-30 DIAGNOSIS — K5909 Other constipation: Secondary | ICD-10-CM

## 2021-07-30 DIAGNOSIS — Z86010 Personal history of colonic polyps: Secondary | ICD-10-CM

## 2021-07-30 LAB — AFP TUMOR MARKER: AFP, Serum, Tumor Marker: 9.4 ng/mL — ABNORMAL HIGH (ref 0.0–9.2)

## 2021-07-30 MED ORDER — LINACLOTIDE 145 MCG PO CAPS
145.0000 ug | ORAL_CAPSULE | Freq: Every day | ORAL | 2 refills | Status: DC
  Filled 2021-07-30 – 2021-08-05 (×2): qty 30, 30d supply, fill #0

## 2021-07-30 MED ORDER — NA SULFATE-K SULFATE-MG SULF 17.5-3.13-1.6 GM/177ML PO SOLN
ORAL | 0 refills | Status: DC
  Filled 2021-07-30: qty 354, 1d supply, fill #0

## 2021-07-30 NOTE — Telephone Encounter (Signed)
There is a note from Watson in the chart detailing the discussion she had with the patient and to dose reduce their pantoprazole. We wouldn't send a new script since she already had some on hand.   The boils would not have anything to do with Epclusa, and if she thinks her mom will run out before her appointment, tell her to call the front desk and reschedule with Estill Bamberg for earlier in the week.

## 2021-08-05 ENCOUNTER — Encounter: Payer: Self-pay | Admitting: Hematology & Oncology

## 2021-08-05 ENCOUNTER — Other Ambulatory Visit: Payer: Self-pay

## 2021-08-05 NOTE — Progress Notes (Signed)
..  The following medication: Opdivo was re-approved for Assistance from [place]. Approved 08/05/2021 to 08/05/2022. No lapse in coverage, continued care.  Original Expiration of Assistance: 09/05/2021. Reason: Self Pay ID: [id number] Next DOS: 08/28/2021.

## 2021-08-06 ENCOUNTER — Other Ambulatory Visit: Payer: Self-pay

## 2021-08-06 ENCOUNTER — Encounter: Payer: Self-pay | Admitting: Hematology & Oncology

## 2021-08-06 ENCOUNTER — Ambulatory Visit (INDEPENDENT_AMBULATORY_CARE_PROVIDER_SITE_OTHER): Payer: Self-pay | Admitting: Pharmacist

## 2021-08-06 DIAGNOSIS — B182 Chronic viral hepatitis C: Secondary | ICD-10-CM

## 2021-08-06 DIAGNOSIS — L989 Disorder of the skin and subcutaneous tissue, unspecified: Secondary | ICD-10-CM

## 2021-08-06 MED ORDER — CEFADROXIL 500 MG PO CAPS
1000.0000 mg | ORAL_CAPSULE | Freq: Two times a day (BID) | ORAL | 0 refills | Status: AC
  Filled 2021-08-06: qty 56, 14d supply, fill #0

## 2021-08-06 MED ORDER — SOFOSBUVIR-VELPATASVIR 400-100 MG PO TABS: 1.0000 | ORAL_TABLET | Freq: Every day | ORAL | 2 refills | Status: DC

## 2021-08-06 NOTE — Telephone Encounter (Signed)
Patient has follow up appointment today with Pharmacy team.

## 2021-08-07 ENCOUNTER — Encounter: Payer: Self-pay | Admitting: Gastroenterology

## 2021-08-07 ENCOUNTER — Other Ambulatory Visit: Payer: Self-pay

## 2021-08-07 ENCOUNTER — Encounter: Payer: Self-pay | Admitting: Hematology & Oncology

## 2021-08-09 LAB — HEPATITIS C RNA QUANTITATIVE
HCV Quantitative Log: <1.18 log IU/mL
HCV RNA, PCR, QN: <15 IU/mL

## 2021-08-13 ENCOUNTER — Encounter: Payer: Self-pay | Admitting: Hematology & Oncology

## 2021-08-13 ENCOUNTER — Other Ambulatory Visit: Payer: Self-pay | Admitting: Internal Medicine

## 2021-08-13 ENCOUNTER — Other Ambulatory Visit: Payer: Self-pay

## 2021-08-13 DIAGNOSIS — I10 Essential (primary) hypertension: Secondary | ICD-10-CM

## 2021-08-13 MED ORDER — LOSARTAN POTASSIUM 50 MG PO TABS
50.0000 mg | ORAL_TABLET | Freq: Every day | ORAL | 6 refills | Status: DC
  Filled 2021-08-13: qty 30, 30d supply, fill #0
  Filled 2021-09-07: qty 30, 30d supply, fill #1
  Filled 2021-10-14: qty 30, 30d supply, fill #2
  Filled 2021-11-10: qty 30, 30d supply, fill #3
  Filled 2021-12-09: qty 30, 30d supply, fill #4
  Filled 2022-01-06 (×2): qty 30, 30d supply, fill #5

## 2021-08-14 ENCOUNTER — Other Ambulatory Visit: Payer: Self-pay

## 2021-08-14 MED ORDER — BETAMETHASONE DIPROPIONATE 0.05 % EX CREA: TOPICAL_CREAM | Freq: Two times a day (BID) | CUTANEOUS | 0 refills | Status: DC

## 2021-08-19 ENCOUNTER — Telehealth: Payer: Self-pay

## 2021-08-19 ENCOUNTER — Encounter: Payer: Self-pay | Admitting: Hematology & Oncology

## 2021-08-19 ENCOUNTER — Other Ambulatory Visit: Payer: Self-pay

## 2021-08-19 ENCOUNTER — Other Ambulatory Visit: Payer: Self-pay | Admitting: Internal Medicine

## 2021-08-19 DIAGNOSIS — L309 Dermatitis, unspecified: Secondary | ICD-10-CM

## 2021-08-19 MED ORDER — BETAMETHASONE DIPROPIONATE 0.05 % EX CREA
TOPICAL_CREAM | Freq: Two times a day (BID) | CUTANEOUS | 0 refills | Status: DC
  Filled 2021-08-19: qty 15, 15d supply, fill #0
  Filled 2021-09-02: qty 15, 15d supply, fill #1

## 2021-08-19 NOTE — Addendum Note (Signed)
Addended by: Lucie Leather D on: 08/19/2021 11:06 AM   Modules accepted: Orders

## 2021-08-19 NOTE — Telephone Encounter (Signed)
[facility] Patient Advocate Encounter  Patient's medications have been couriered to [facility] from [employer] and will be picked up 09/03/21.  3rd box of [place] , [place] Patient [facility] Phone: [phone] Fax:  [phone]

## 2021-08-20 ENCOUNTER — Encounter: Payer: Self-pay | Admitting: Internal Medicine

## 2021-08-20 ENCOUNTER — Other Ambulatory Visit: Payer: Self-pay

## 2021-08-20 ENCOUNTER — Ambulatory Visit (INDEPENDENT_AMBULATORY_CARE_PROVIDER_SITE_OTHER): Payer: Self-pay | Admitting: Internal Medicine

## 2021-08-20 VITALS — BP 158/84 | HR 94 | Resp 16 | Ht 64.0 in | Wt 151.2 lb

## 2021-08-20 DIAGNOSIS — B182 Chronic viral hepatitis C: Secondary | ICD-10-CM

## 2021-08-20 DIAGNOSIS — Z113 Encounter for screening for infections with a predominantly sexual mode of transmission: Secondary | ICD-10-CM

## 2021-08-20 DIAGNOSIS — R21 Rash and other nonspecific skin eruption: Secondary | ICD-10-CM

## 2021-08-20 DIAGNOSIS — C220 Liver cell carcinoma: Secondary | ICD-10-CM

## 2021-08-20 NOTE — Addendum Note (Signed)
Addended by: Caffie Pinto on: 08/20/2021 04:37 PM   Modules accepted: Orders

## 2021-08-20 NOTE — Progress Notes (Signed)
[facility] for Infectious Disease  Patient Active Problem List   Diagnosis Date Noted   Hypercalcemia 09/22/2020   Chronic hepatitis C with hepatic coma ([place]) 09/22/2020   Goals of care, counseling/discussion 09/01/2020   Hepatocellular carcinoma ([place]) 08/14/2020   Hyperglycemia 08/10/2020   Pneumoperitoneum 08/09/2020   History of colorectal cancer 08/09/2020   Liver mass, left lobe 08/09/2020   Insulin-requiring or dependent type II diabetes mellitus ([place]) 08/09/2020   Hypertension associated with diabetes ([place]) 08/09/2020   Constipation, chronic 08/09/2020   Perforated gastric ulcer s/p lap omental [facility]al patch 08/09/2020 08/09/2020      Subjective:    Patient ID: [name] [name], female    DOB: [birth year], 76 y.o.   MRN: [id number]  Chief Complaint  Patient presents with   Follow-up     HPI:  [name] [name] is a 76 y.o. female with hcc/chronic hep c, here for evaluation for hep c treatment  She is known to our clinic. When she was first evaluated a year ago, decision to treat hep c was deferred given her active bulky Abrams  She has been undergoing hcc treatment with oncology via immune therapy and her lesion has improved significantly on 02/2021 mra. There is a plan to repeat mra in 05/2021  She is feeling well. No fatigue, ascites, blood/melena per rectum, joint pain, skin rash  She has mild-moderate lft elevation that is stable  Of note, the mra did  mention sign of cirrhosis and portal hypertension with trace ascites She has egd 3/09 which saw gastritis but no varices. Colonoscopy same day also no varices except sigmoid diverticulosis  She never needed paracentesis or uses diuretics for hx of ascites. No hx gib -------------------- 06/17/21 id f/u Checkpoint inhibitors hold due to lft up -- assymptomatic Feels well/baseline Dr Marin Olp wants to treat her for hep c -- recent repeat mri stable/improved but still evidence of  hcc    08/20/21 id f/u She has started epclusa 6 months planned About 4-5 weeks into epclusa developed a burning/itch rash started on feet then spread centrally Started with a bump then enlarge into vessicle/blister/papule and then can coalesce and form large blisters No fever, chill No arthralgia No myalgia No headache No visual change No hematuria No abd pain  She was given abx for cellulitis without help She was given topical steroid for thought of dishydrosis but didn't help  Where the lesions coalesced she has pain and keeping her awake   Allergies  Allergen Reactions   Amoxicillin Swelling and Rash    Daughter is unsure if patient is allergic to Amoxicillin or Clarithromycin.   Bactrim [Sulfamethoxazole-Trimethoprim] Swelling   Clarithromycin Swelling and Rash    Daughter is unsure if Clarithromycin or Amoxicillin caused rash and swelling.      Outpatient Medications Prior to Visit  Medication Sig Dispense Refill   betamethasone dipropionate 0.05 % cream Apply topically 2 (two) times daily. 30 g 0   brimonidine (ALPHAGAN) 0.2 % ophthalmic solution 1 drop 2 (two) times daily.     cefadroxil (DURICEF) 500 MG capsule Take 2 capsules (1,000 mg total) by mouth 2 (two) times daily for 14 days. 56 capsule 0   gabapentin (NEURONTIN) 300 MG capsule Take 1 capsule (300 mg total) by mouth once nightly at bedtime. 30 capsule 3   glucose blood (TRUE METRIX BLOOD GLUCOSE TEST) test strip Use as instructed 100 each 12   insulin isophane & regular human KwikPen (HUMULIN 70/30  KWIKPEN) (70-30) 100 UNIT/ML KwikPen INJECT 30-40 units subcutANEOUSLY EVERY MORNING and 5-12 units EVERY EVENING 15 mL 11   insulin NPH-regular Human (70-30) 100 UNIT/ML injection Inject 30-40 units into the skin once every morning and 5-12 units once every evening. 10 mL 0   Insulin Pen Needle 31G X 6 MM MISC use as directed 100 each 6   linaclotide (LINZESS) 145 MCG CAPS capsule Take 1 capsule (145 mcg total)  by mouth once daily before breakfast. (Office visit for further refills) 30 capsule 2   losartan (COZAAR) 50 MG tablet Take 1 tablet (50 mg total) by mouth at bedtime. 30 tablet 6   Na Sulfate-K Sulfate-Mg Sulf 17.5-3.13-1.6 GM/177ML SOLN Use as directed 354 mL 0   NIFEdipine (PROCARDIA-XL/NIFEDICAL-XL) 30 MG 24 hr tablet Take 1 tablet (30 mg total) by mouth in the morning and at bedtime. MUST HAVE OFFICE VISIT FOR REFILLS 60 tablet 2   pantoprazole (PROTONIX) 40 MG tablet Take 1 tablet (40 mg total) by mouth 2 (two) times daily. (Patient taking differently: Take 40 mg by mouth daily.) 180 tablet 3   polyethylene glycol powder (GLYCOLAX/MIRALAX) 17 GM/SCOOP powder Take 17 g by mouth daily as needed.     Sofosbuvir-Velpatasvir (EPCLUSA) 400-100 MG TABS Take 1 tablet by mouth daily. 30 tablet 2   terbinafine (LAMISIL) 250 MG tablet Take 1 tablet (250 mg total) by mouth once daily. 30 tablet 1   Facility-Administered Medications Prior to Visit  Medication Dose Route Frequency Provider Last Rate Last Admin   sodium chloride flush (NS) 0.9 % injection 10 mL  10 mL Intravenous PRN Celso Amy, NP   10 mL at 12/18/20 1009     Social History   Socioeconomic History   Marital status: Widowed    Spouse name: Not on file   Number of children: 5   Years of education: 14   Highest education level: Associate degree: occupational, Hotel manager, or vocational program  Occupational History   Not on file  Tobacco Use   Smoking status: Never   Smokeless tobacco: Never  Vaping Use   Vaping Use: Never used  Substance and Sexual Activity   Alcohol use: Not Currently   Drug use: Never   Sexual activity: Not Currently  Other Topics Concern   Not on file  Social History Narrative   Originally from [place].  Speaks [place] and [place]   Social Determinants of Radio broadcast assistant Strain: Not on file  Food Insecurity: Not on file  Transportation Needs: Not on file  Physical Activity: Not on  file  Stress: Not on file  Social Connections: Not on file  Intimate Partner Violence: Not on file      Review of Systems    All other ros negative  Objective:    BP (!) 158/84   Pulse 94   Resp 16   Ht '5\' 4"'$  (1.626 m)   Wt 151 lb 3.2 oz (68.6 kg)   SpO2 98%   BMI 25.95 kg/m  Nursing note and vital signs reviewed.  Physical Exam      General/constitutional: no distress, pleasant HEENT: Normocephalic, PER, Conj Clear, EOMI, Oropharynx clear Neck supple CV: rrr no mrg Lungs: clear to auscultation, normal respiratory effort Abd: Soft, Nontender Ext: no edema Skin: see pictures (papular rash on trunk as well) Neuro: nonfocal MSK: no peripheral joint swelling/tenderness/warmth; back spines nontender  Labs: Lab Results  Component Value Date   WBC 5.1 07/29/2021   HGB 12.6 07/29/2021   HCT 37.9 07/29/2021   MCV 92.9 07/29/2021   PLT 182 65/04/5463   Last metabolic panel Lab Results  Component Value Date   GLUCOSE 112 (H) 07/29/2021   NA 136 07/29/2021   K 4.0 07/29/2021   CL 103 07/29/2021   CO2 27 07/29/2021   BUN 20 07/29/2021   CREATININE 0.75 07/29/2021   GFRNONAA >60 07/29/2021   CALCIUM 10.4 (H) 07/29/2021   PHOS 2.5 08/10/2020   PHOS 2.5 08/10/2020   PROT 7.4 07/29/2021   ALBUMIN 3.9 07/29/2021   BILITOT 0.7 07/29/2021   ALKPHOS 183 (H) 07/29/2021   AST 30 07/29/2021   ALT 37 07/29/2021   ANIONGAP 6 07/29/2021   No results found for: "INR", "PROTIME"   Micro:  Serology:  Imaging: 05/30/21 mri liver 1. Continued interval reduction in size of a dominant mass of hepatic segment III as well as additional smaller masses, consistent continued treatment response of biopsy proven hepatocellular carcinoma. 2. Unchanged subcentimeter arterially hyperenhancing focus of the liver dome, although distinct in enhancement characteristics from other multifocal hepatocellular carcinoma lesions, of  intermediate suspicion for hepatocellular carcinoma. This is a LI-RADS category 3 lesion if so characterized. Attention on follow-up. 3. No new suspicious lesions identified. 4. Trace ascites. 5. Cholelithiasis.   02/19/21 mr abdomen 1. Marked interval response to therapy with diminished size of all visible lesions, near complete resolution of most lesions aside from the largest lesion which likely a small amount of residual viable tumor. 2. No new lesions. 3. Signs of cirrhosis and portal hypertension. 4. Cholelithiasis. 5. Diminished ascites with only trace ascites and with resolution of third spacing that was seen previously.    Assessment & Plan:   Problem List Items Addressed This Visit       Digestive   Hepatocellular carcinoma ([place])   Other Visit Diagnoses     Rash    -  Primary   Relevant Orders   Ambulatory referral to Dermatology   Cryoglobulin   CBC w/Diff   COMPLETE METABOLIC PANEL WITH GFR   ANCA Profile   Chronic hepatitis C without hepatic coma (HCC)       Screening for venereal disease       Relevant Orders   Fluorescent treponemal ab(fta)-IgG-bld   RPR   Urine cytology ancillary only   HIV antibody (with reflex)     Reviewed natural hx of chronic hep c Reviewed treatment benefit and treatment initiation/timing in special cases like her who has high burden nonresectable hcc  Currently while there is potentially association with hep c and hcc carcinogenesis, it is unclear if not treating hep c will decrease chance of cure of hcc. The reverse seems to be true (with active hcc, the rate of cure of hep c with DAA is much lower). As she only has 2 shots at DAA I would like to see her at least stable from [facility] standpoint in terms of cure for 3-6 months before initiating DAA  I agree that treating hep C successfully will reduce the [facility] recurrent rate and also improve all-cause mortality for her   --------------------- 06/17/21 assessment Patient from  malignancy standpoint stable but evidence of disease I agree with oncology regarding the recent uptick in lft likely related to checkpoint inhibitor  Will need hcv genotype/rna quant for insurance authorization  Given cirhosis/decompensation (ascites) will plan 24 weeks of epclusa. She is unlikely  a good candidate for interferon co-treatment  She needs to see ongoing GI follow up for routine variceal monitoring/ablation as needed   -labs as discussed -refer to pharmacy to discuss epclusa treamtent -- 24 weeks plan -see me again no earlier than 3 months after the 24 weeks of epclusa done. -no need for elastography   -discussed natural progression of hep c, transmission (avoid sharing personal hygiene equipment) -discussed avoid toxin like etoh and excessive acetamaminphen (no more than 2 gram a day) -discussed healthy life style and good glucose control -discussed avoiding eating raw sea food -discussed we can treat hep c but can be reinfected -discussed hepatitis coinfection and vaccination  ------------- 08/20/21 assessment Rash suspect cryo vs lcv vasculitis like rash Doesn't appear to be cutaneous porphyria I do not think this is epclusa drug rash  Will check rpr/syphilis and gc/chlam (keratoderma blenorrhagicum)  Continue epclusa Refer to dermatology dr Orlene Och Stop abx/topical steroid Cryoglobulin Anca  Ibuprofen as needed for pain   I have spent a total of 40 minutes of face-to-face and non-face-to-face time, excluding clinical staff time, preparing to see patient, ordering tests and/or medications, and provide counseling the patient    Follow-up: Return in about 4 weeks (around 09/17/2021).      Jabier Mutton, [place] for [place] [phone]  pager   [phone] cell 08/20/2021, 3:11 PM

## 2021-08-20 NOTE — Addendum Note (Signed)
Addended by: Caffie Pinto on: 08/20/2021 05:06 PM   Modules accepted: Orders

## 2021-08-20 NOTE — Addendum Note (Signed)
Addended by: Caffie Pinto on: 08/20/2021 03:43 PM   Modules accepted: Orders

## 2021-08-20 NOTE — Patient Instructions (Signed)
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 Please let me know within a week if you haven't heard of appointment   Will check blood test to make sure you are ok with liver/kidney

## 2021-08-21 ENCOUNTER — Encounter: Payer: Self-pay | Admitting: Hematology & Oncology

## 2021-08-21 ENCOUNTER — Other Ambulatory Visit: Payer: Self-pay

## 2021-08-24 ENCOUNTER — Other Ambulatory Visit: Payer: Self-pay

## 2021-08-24 ENCOUNTER — Encounter: Payer: Self-pay | Admitting: Hematology & Oncology

## 2021-08-24 ENCOUNTER — Other Ambulatory Visit: Payer: Self-pay | Admitting: Pharmacist

## 2021-08-24 ENCOUNTER — Ambulatory Visit: Payer: Self-pay | Attending: Internal Medicine | Admitting: Internal Medicine

## 2021-08-24 ENCOUNTER — Encounter: Payer: Self-pay | Admitting: Internal Medicine

## 2021-08-24 ENCOUNTER — Other Ambulatory Visit: Payer: Self-pay | Admitting: Hematology & Oncology

## 2021-08-24 ENCOUNTER — Other Ambulatory Visit (HOSPITAL_COMMUNITY)
Admission: RE | Admit: 2021-08-24 | Discharge: 2021-08-24 | Disposition: A | Discharge status: Home / Self Care | Payer: Self-pay | Source: Ambulatory Visit | Attending: Internal Medicine | Admitting: Internal Medicine

## 2021-08-24 VITALS — BP 171/84 | HR 88 | Resp 16

## 2021-08-24 DIAGNOSIS — L959 Vasculitis limited to the skin, unspecified: Secondary | ICD-10-CM

## 2021-08-24 DIAGNOSIS — Z113 Encounter for screening for infections with a predominantly sexual mode of transmission: Secondary | ICD-10-CM | POA: Insufficient documentation

## 2021-08-24 DIAGNOSIS — R21 Rash and other nonspecific skin eruption: Secondary | ICD-10-CM

## 2021-08-24 DIAGNOSIS — E1142 Type 2 diabetes mellitus with diabetic polyneuropathy: Secondary | ICD-10-CM

## 2021-08-24 DIAGNOSIS — L309 Dermatitis, unspecified: Secondary | ICD-10-CM

## 2021-08-24 MED ORDER — PREDNISONE 20 MG PO TABS
40.0000 mg | ORAL_TABLET | Freq: Every day | ORAL | 0 refills | Status: DC
  Filled 2021-08-24: qty 28, 14d supply, fill #0

## 2021-08-24 MED ORDER — TRAMADOL HCL 50 MG PO TABS
50.0000 mg | ORAL_TABLET | Freq: Three times a day (TID) | ORAL | 0 refills | Status: DC | PRN
  Filled 2021-08-24: qty 60, 20d supply, fill #0

## 2021-08-24 MED ORDER — MUPIROCIN 2 % EX OINT
TOPICAL_OINTMENT | CUTANEOUS | 0 refills | Status: DC
  Filled 2021-08-24: qty 22, 10d supply, fill #0

## 2021-08-24 MED ORDER — INSULIN ASPART 100 UNIT/ML IJ SOLN
INTRAMUSCULAR | 11 refills | Status: DC
  Filled 2021-08-24: qty 10, fill #0

## 2021-08-24 MED ORDER — INSULIN LISPRO 100 UNIT/ML IJ SOLN
INTRAMUSCULAR | 2 refills | Status: DC
  Filled 2021-08-24: qty 20, 36d supply, fill #0
  Filled 2021-12-09: qty 10, 16d supply, fill #1

## 2021-08-24 NOTE — Progress Notes (Deleted)
S:   [name] [name] [name] is a 76 y.o. female who presents for diabetes evaluation, education, and management.  PMH is significant for HTN, hepatitis C, hepatocellular carcinoma, insulin-dependent T2DM.  Patient was referred and last seen by Primary Care Provider, Dr. Karle Plumber, on 06/19/21.   At last visit, A1c was 7.3 and BG 251. Insulin lispro and insulin glargine pens started, but have since been discontinued and replaced with Humulin 70/30 Kwikpen BID (cost?). Today's visit scheduled for BS check.   Care gaps - ophthalmology exam, lipid panel    Today, patient arrives in *** good spirits and presents without *** any assistance. ***  Patient reports Diabetes was diagnosed in 2022.   Current diabetes medications include: insulin NPH-regular 70-30 (30-40 units qAM and 5-12 units qPM) Current hypertension medications include: losartan '50mg'$ , nifedipine '30mg'$  Current hyperlipidemia medications include: N/A (no lipid labs ever?)   Patient reports adherence to taking all medications as prescribed.  *** Patient denies adherence with medications, reports missing *** medications *** times per week, on average.  Do you feel that your medications are working for you? {YES NO:22349} Have you been experiencing any side effects to the medications prescribed? {YES NO:22349} Do you have any problems obtaining medications due to transportation or finances? {YES [id number] Insurance coverage: None  Patient {Actions; denies-reports:120008} hypoglycemic events.  Reported home fasting blood sugars: ***  Reported 2 hour post-meal/random blood sugars: ***.  Patient {Actions; denies-reports:120008} nocturia (nighttime urination).  Patient {Actions; denies-reports:120008} neuropathy (nerve pain). Patient {Actions; denies-reports:120008} visual changes. Patient {Actions; denies-reports:120008} self foot exams.   Patient reported dietary habits:   Patient-reported exercise habits:  ***  O:   7 day average blood glucose: ***  *** CGM Download:  % Time CGM is active: ***% Average Glucose: *** mg/dL Glucose Management Indicator: ***  Glucose Variability: *** (goal <36%) Time in Goal:  - Time in range 70-180: ***% - Time above range: ***% - Time below range: ***% Observed patterns:   Lab Results  Component Value Date   HGBA1C 7.3 (A) 06/19/2021   There were no vitals filed for this visit.  Lipid Panel  No results found for: "CHOL", "TRIG", "HDL", "CHOLHDL", "VLDL", "LDLCALC", "LDLDIRECT"  Clinical Atherosclerotic Cardiovascular Disease (ASCVD): No  The ASCVD Risk score (Arnett DK, et al., 2019) failed to calculate for the following reasons:   Cannot find a previous HDL lab   Cannot find a previous total cholesterol lab   A/P: Diabetes longstanding *** currently ***. Patient is *** able to verbalize appropriate hypoglycemia management plan. Medication adherence appears ***. Control is suboptimal due to ***. -{Meds adjust:18428} basal insulin *** (insulin ***). Patient will continue to titrate 1 unit every *** days if fasting blood sugar > '100mg'$ /dl until fasting blood sugars reach goal or next visit.  -{Meds adjust:18428} rapid insulin *** (insulin ***) to ***.  -{Meds adjust:18428} GLP-1 *** (generic ***) to ***.  -{Meds adjust:18428} SGLT2-I *** (generic ***) to ***. Counseled on sick day rules. -{Meds adjust:18428} metformin *** to ***.  -Patient educated on purpose, proper use, and potential adverse effects of ***.  -Extensively discussed pathophysiology of diabetes, recommended lifestyle interventions, dietary effects on blood sugar control.  -Counseled on s/sx of and management of hypoglycemia.  -Next A1c anticipated August 2023.   ASCVD risk - primary prevention in patient with diabetes. Last LDL is *** not at goal of <70 *** mg/dL. ASCVD risk factors include *** and 10-year ASCVD risk score of ***. {Desc; low/moderate/high:110033} intensity statin  indicated.  -{Meds adjust:18428} ***statin *** mg.   Hypertension longstanding *** currently ***. Blood pressure goal of <130/80 *** mmHg. Medication adherence ***. Blood pressure control is suboptimal due to ***. -***  Written patient instructions provided. Patient verbalized understanding of treatment plan.  Total time in face to face counseling *** minutes.    Follow-up:  Pharmacist ***. PCP clinic visit in ***.  Patient seen with ***.

## 2021-08-24 NOTE — Progress Notes (Signed)
Patient ID: [name] [name], female    DOB: [birth year]  MRN: [id number]  CC: Worsening rash  Subjective: [name] [name] is a 76 y.o. female who presents for eval of rash.  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    Patient complains of rash that started on her right foot on 07/19/2021.  2 days prior, she was started on Epclusa for treatment of hepatitis C. Over time, she developed lesions on the dorsal surface of the feet with burning in the feet.  Rash itchy. Saw Dr. Margarita Rana as UC visit on 07/28/2021 for the same.  Dx with possible tinea pedis and was prescribed Lamisil and gabapentin 300 mg to use at bedtime. -No improvement with this treatment and dermatitis got worse.  As small sores that coalesce into larger ones.  Some blistering.  Started getting exfoliation of skin on the feet with cracking of skin at the heels and drainage.  Sores now on the dorsum and plantar surface of both feet, the legs both upper and lower, the arms, buttocks and lower back. -Rash is very itchy and painful. -Saw ID and reports being told that the rash is likely not due to Epclusa.  Told it may be autoimmune or vasculitic.  Screen for syphilis and HIV negative.  CBC normal with differential.  Panel ANCA and cryoglobulin serology pending. -She has not had any fever.  No travel outside of the [place]. -Scheduled to receive her next round of immunotherapy with nivolumab later this week for hepatocellular CA.  In regards to her diabetes, she is on Humulin 70/30 30 units in the morning and 5 to 7 units in the evenings.  Checks blood sugars before breakfast with range being 80-129 and before dinner with range being 83-130. Patient Active Problem List    Diagnosis Date Noted   Hypercalcemia 09/22/2020   Chronic hepatitis C with hepatic coma ([place]) 09/22/2020   Goals of care, counseling/discussion 09/01/2020   Hepatocellular carcinoma ([place]) 08/14/2020   Hyperglycemia 08/10/2020   Pneumoperitoneum 08/09/2020   History of colorectal cancer 08/09/2020   Liver mass, left lobe 08/09/2020   Insulin-requiring or dependent type II diabetes mellitus ([place]) 08/09/2020   Hypertension associated with diabetes ([place]) 08/09/2020   Constipation, chronic 08/09/2020   Perforated gastric ulcer s/p lap omental Phillip Heal patch 08/09/2020 08/09/2020     Current Outpatient Medications on File Prior to Visit  Medication Sig Dispense Refill   betamethasone dipropionate 0.05 % cream Apply topically 2 (two) times daily. 30 g 0   brimonidine (ALPHAGAN) 0.2 % ophthalmic solution 1 drop 2 (two) times daily.     gabapentin (NEURONTIN) 300 MG capsule Take 1 capsule (300 mg total) by mouth once nightly at bedtime. 30 capsule 3   glucose blood (TRUE METRIX BLOOD GLUCOSE TEST) test strip Use as instructed 100 each 12   insulin isophane & regular human KwikPen (HUMULIN 70/30 KWIKPEN) (70-30) 100 UNIT/ML KwikPen INJECT 30-40 units subcutANEOUSLY EVERY MORNING and 5-12 units EVERY EVENING 15 mL 11   insulin NPH-regular Human (70-30) 100 UNIT/ML injection Inject 30-40 units into the skin once every morning and 5-12 units once every evening. 10 mL 0   Insulin Pen Needle 31G X 6 MM MISC use as directed 100 each 6  linaclotide (LINZESS) 145 MCG CAPS capsule Take 1 capsule (145 mcg total) by mouth once daily before breakfast. (Office visit for further refills) 30 capsule 2   losartan (COZAAR) 50 MG tablet Take 1 tablet (50 mg total) by mouth at bedtime. 30 tablet 6   Na Sulfate-K Sulfate-Mg Sulf 17.5-3.13-1.6 GM/177ML SOLN Use as directed 354 mL 0   NIFEdipine (PROCARDIA-XL/NIFEDICAL-XL) 30 MG 24 hr tablet Take 1 tablet (30 mg total) by mouth in the morning and at bedtime. MUST HAVE  OFFICE VISIT FOR REFILLS 60 tablet 2   pantoprazole (PROTONIX) 40 MG tablet Take 1 tablet (40 mg total) by mouth 2 (two) times daily. (Patient taking differently: Take 40 mg by mouth daily.) 180 tablet 3   polyethylene glycol powder (GLYCOLAX/MIRALAX) 17 GM/SCOOP powder Take 17 g by mouth daily as needed.     Sofosbuvir-Velpatasvir (EPCLUSA) 400-100 MG TABS Take 1 tablet by mouth daily. 30 tablet 2   terbinafine (LAMISIL) 250 MG tablet Take 1 tablet (250 mg total) by mouth once daily. 30 tablet 1   Current Facility-Administered Medications on File Prior to Visit  Medication Dose Route Frequency Provider Last Rate Last Admin   sodium chloride flush (NS) 0.9 % injection 10 mL  10 mL Intravenous PRN Celso Amy, NP   10 mL at 12/18/20 1009    Allergies  Allergen Reactions   Amoxicillin Swelling and Rash    Daughter is unsure if patient is allergic to Amoxicillin or Clarithromycin.   Bactrim [Sulfamethoxazole-Trimethoprim] Swelling   Clarithromycin Swelling and Rash    Daughter is unsure if Clarithromycin or Amoxicillin caused rash and swelling.    Social History   Socioeconomic History   Marital status: Widowed    Spouse name: Not on file   Number of children: 5   Years of education: 14   Highest education level: Associate degree: occupational, Hotel manager, or vocational program  Occupational History   Not on file  Tobacco Use   Smoking status: Never   Smokeless tobacco: Never  Vaping Use   Vaping Use: Never used  Substance and Sexual Activity   Alcohol use: Not Currently   Drug use: Never   Sexual activity: Not Currently  Other Topics Concern   Not on file  Social History Narrative   Originally from [place].  Speaks [place] and [place]   Social Determinants of Radio broadcast assistant Strain: Not on file  Food Insecurity: Not on file  Transportation Needs: Not on file  Physical Activity: Not on file  Stress: Not on file  Social Connections: Not on file  Intimate  Partner Violence: Not on file    Family History  Problem Relation Age of Onset   Cancer Father        type unknown, was on the leg   Other Daughter        Acoustic neuroma   Breast cancer Neg Hx    Colon cancer Neg Hx    Esophageal cancer Neg Hx    Rectal cancer Neg Hx    Stomach cancer Neg Hx     Past Surgical History:  Procedure Laterality Date   COLON RESECTION  2005   In [place] - ?colon cancer   IR IMAGING GUIDED PORT INSERTION  09/18/2020   LAPAROSCOPIC GASTRIC RESECTION N/A 08/09/2020   Procedure: LAPAROSCOPIC EXPLORATION OF ABDOMEN, PERFORATED ULCER REPAIR, LIVER BIOPSY;  Surgeon: Michael Boston, MD;  Location: [facility];  Service: General;  Laterality: N/A;    ROS: Review of Systems  Negative except as stated above  PHYSICAL EXAM: BP (!) 171/84 (BP Location: Left Arm, Patient Position: Sitting, Cuff Size: Normal)   Pulse 88   Resp 16   SpO2 98%   Physical Exam  General appearance - elderly African female who appears uncomfortable due to body rash Mental status - normal mood, behavior, speech, dress, motor activity, and thought processes Neck - supple, no significant adenopathy Lymphatics - no axillary or inguinal LN Chest - clear to auscultation, no wheezes, rales or rhonchi, symmetric air entry Heart - RRR Skin -significant cracking, exudation and exfoliation of the skin most severe on the dorsal and dorsal lateral and dorsal medial surface of the feet.  Drainage of clear serosanguineous fluid from the heel of both feet.  Some areas fluctuant to touch.  Some blistering.  Dermatitis extends up the legs to the upper thigh, buttocks, posterior thorax and on the arms.           Latest Ref Rng & Units 08/20/2021    3:41 PM 07/29/2021   10:01 AM 07/16/2021    7:42 AM  CMP  Glucose 65 - 99 mg/dL 58  112  171   BUN 7 - 25 mg/dL '16  20  15   '$ Creatinine 0.60 - 1.00 mg/dL 0.58  0.75  0.78   Sodium 135 - 146 mmol/L 139  136  139   Potassium 3.5 - 5.3 mmol/L 4.1  4.0  4.1    Chloride 98 - 110 mmol/L 106  103  107   CO2 20 - 32 mmol/L '21  27  26   '$ Calcium 8.6 - 10.4 mg/dL 10.4  10.4  10.4   Total Protein 6.1 - 8.1 g/dL 8.4  7.4  7.3   Total Bilirubin 0.2 - 1.2 mg/dL 0.6  0.7  0.5   Alkaline Phos 38 - 126 U/L  183  178   AST 10 - 35 U/L 43  30  29   ALT 6 - 29 U/L 39  37  34    Lipid Panel  No results found for: "CHOL", "TRIG", "HDL", "CHOLHDL", "VLDL", "LDLCALC", "LDLDIRECT"  CBC    Component Value Date/Time   WBC 4.5 08/24/2021 0858   RBC 4.50 08/24/2021 0858   HGB 13.8 08/24/2021 0858   HGB 12.6 07/29/2021 1001   HCT 41.6 08/24/2021 0858   PLT 200 08/24/2021 0858   PLT 182 07/29/2021 1001   MCV 92.4 08/24/2021 0858   MCH 30.7 08/24/2021 0858   MCHC 33.2 08/24/2021 0858   RDW 13.1 08/24/2021 0858   LYMPHSABS 1,526 08/24/2021 0858   MONOABS 0.3 07/29/2021 1001   EOSABS 99 08/24/2021 0858   BASOSABS 9 08/24/2021 0858    ASSESSMENT AND PLAN: 1. Dermatitis Differential diagnosis includes reaction to medication in particular Epclusa or nivolumab versus vasculitic process/autoimmune process. Labs added today to include sed rate, rheumatoid factor, ANA with reflex.  Await results of cryoglobulin. Message sent to her oncologist informing him of this new development and inquiring whether this may be due to immunotherapy that she is receiving. In the meantime, I will start her on prednisone at 40 mg daily.  Patient advised blood sugars may be significantly increased due to prednisone being on board.  In anticipation of this, I have added NovoLog sliding scale to the Humulin 70/30 insulin. -Given a supply of 4 x 4 gauze and cling wraps.  Advised to wash feet daily and plain water, apply Bactroban ointment and then 4 x 4 gauze to  areas of the skin that are cracked open mainly on the feet.  Then apply cling wrap -I have already submitted referral to [facility].  I requested our referral coordinator to check back with them today about getting her  an appointment as soon as possible.  I have also submitted a referral to wound care. Given a limited supply of tramadol to use as needed for pain.  Advised that the medication can cause drowsiness and that it is a narcotic medication.  Take only as needed. [place] controlled substance reporting system reviewed. - mupirocin ointment (BACTROBAN) 2 %; Apply once a day to cracked areas on feet with dressing changes  Dispense: 22 g; Refill: 0 - predniSONE (DELTASONE) 20 MG tablet; Take 2 tablets (40 mg total) by mouth daily.  Dispense: 28 tablet; Refill: 0 - AMB referral to wound care center - traMADol (ULTRAM) 50 MG tablet; Take 1 tablet (50 mg total) by mouth every 8 (eight) hours as needed.  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  Dispense: 28 tablet; Refill: 0 - C-ANCA Titer - CYCLIC CITRUL PEPTIDE ANTIBODY, IGG/IGA  3. Diabetic polyneuropathy associated with type 2 diabetes mellitus ([place]) See discussion under #1 above - traMADol (ULTRAM) 50 MG tablet; Take 1 tablet (50 mg total) by mouth every 8 (eight) hours as needed.  Dispense: 60 tablet; Refill: 0    Patient was given the opportunity to ask questions.  Patient verbalized understanding of the plan and was able to repeat key elements of the plan.   This documentation was completed using Radio producer.  Any transcriptional errors are unintentional.  Orders Placed This Encounter  Procedures   ANA w/Reflex   Rheumatoid factor   Sedimentation Rate   C-ANCA Titer   CYCLIC CITRUL PEPTIDE ANTIBODY, IGG/IGA   AMB referral to wound care center     Requested Prescriptions   Signed Prescriptions Disp Refills   mupirocin ointment (BACTROBAN) 2 % 22 g 0    Sig: Apply once a day to cracked areas on feet with dressing changes    predniSONE (DELTASONE) 20 MG tablet 28 tablet 0    Sig: Take 2 tablets (40 mg total) by mouth daily.   traMADol (ULTRAM) 50 MG tablet 60 tablet 0    Sig: Take 1 tablet (50 mg total) by mouth every 8 (eight) hours as needed.    Return in about 2 weeks (around 09/07/2021) for Cancel appt with me tomorrow and schedule again in 2 wks.  Karle Plumber, MD, FACP

## 2021-08-25 ENCOUNTER — Ambulatory Visit: Payer: Self-pay | Admitting: Internal Medicine

## 2021-08-25 LAB — URINE CYTOLOGY ANCILLARY ONLY
Chlamydia: NEGATIVE
Comment: NEGATIVE
Comment: NORMAL
Neisseria Gonorrhea: NEGATIVE

## 2021-08-26 ENCOUNTER — Other Ambulatory Visit: Payer: Self-pay

## 2021-08-26 ENCOUNTER — Telehealth: Payer: Self-pay | Admitting: Internal Medicine

## 2021-08-26 ENCOUNTER — Encounter: Payer: Self-pay | Admitting: Internal Medicine

## 2021-08-26 ENCOUNTER — Encounter: Payer: Self-pay | Admitting: Hematology & Oncology

## 2021-08-26 ENCOUNTER — Ambulatory Visit (INDEPENDENT_AMBULATORY_CARE_PROVIDER_SITE_OTHER): Payer: Self-pay | Admitting: Student

## 2021-08-26 VITALS — BP 128/75 | HR 92 | Temp 98.3°F | Ht 64.0 in | Wt 151.8 lb

## 2021-08-26 DIAGNOSIS — R21 Rash and other nonspecific skin eruption: Secondary | ICD-10-CM

## 2021-08-26 DIAGNOSIS — L280 Lichen simplex chronicus: Secondary | ICD-10-CM | POA: Insufficient documentation

## 2021-08-26 LAB — COMPLETE METABOLIC PANEL WITH GFR
AG Ratio: 1 (calc) (ref 1.0–2.5)
ALT: 39 U/L — ABNORMAL HIGH (ref 6–29)
AST: 43 U/L — ABNORMAL HIGH (ref 10–35)
Albumin: 4.1 g/dL (ref 3.6–5.1)
Alkaline phosphatase (APISO): 218 U/L — ABNORMAL HIGH (ref 37–153)
BUN/Creatinine Ratio: 28 (calc) — ABNORMAL HIGH (ref 6–22)
BUN: 16 mg/dL (ref 7–25)
CO2: 21 mmol/L (ref 20–32)
Calcium: 10.4 mg/dL (ref 8.6–10.4)
Chloride: 106 mmol/L (ref 98–110)
Creat: 0.58 mg/dL — ABNORMAL LOW (ref 0.60–1.00)
Globulin: 4.3 g/dL (calc) — ABNORMAL HIGH (ref 1.9–3.7)
Glucose, Bld: 58 mg/dL — ABNORMAL LOW (ref 65–99)
Potassium: 4.1 mmol/L (ref 3.5–5.3)
Sodium: 139 mmol/L (ref 135–146)
Total Bilirubin: 0.6 mg/dL (ref 0.2–1.2)
Total Protein: 8.4 g/dL — ABNORMAL HIGH (ref 6.1–8.1)
eGFR: 94 mL/min/{1.73_m2} (ref >=60)

## 2021-08-26 LAB — PAN-ANCA
ANCA SCREEN: POSITIVE — AB
Myeloperoxidase Abs: <1 AI (ref <1.0)
Serine Protease 3: <1 AI (ref <1.0)

## 2021-08-26 LAB — RHEUMATOID FACTOR: Rheumatoid fact SerPl-aCnc: 10.6 IU/mL (ref <14.0)

## 2021-08-26 LAB — ANA W/REFLEX: ANA Titer 1: NEGATIVE

## 2021-08-26 LAB — RPR: RPR Ser Ql: NONREACTIVE

## 2021-08-26 LAB — SEDIMENTATION RATE: Sed Rate: 8 mm/hr (ref 0–40)

## 2021-08-26 LAB — HIV ANTIBODY (ROUTINE TESTING W REFLEX): HIV 1&2 Ab, 4th Generation: NONREACTIVE

## 2021-08-26 LAB — RFLX ATYPICAL P-ANCA TITER: ATYPICAL P ANCA TITER: 1:40 {titer} — ABNORMAL HIGH

## 2021-08-26 LAB — CYCLIC CITRUL PEPTIDE ANTIBODY, IGG/IGA: Cyclic Citrullin Peptide Ab: 6 units (ref 0–19)

## 2021-08-26 MED ORDER — MUPIROCIN 2 % EX OINT
TOPICAL_OINTMENT | CUTANEOUS | 0 refills | Status: DC
  Filled 2021-08-26: qty 22, fill #0
  Filled 2021-08-26: qty 22, 4d supply, fill #0

## 2021-08-26 NOTE — Progress Notes (Signed)
  SUBJECTIVE:   CHIEF COMPLAINT / HPI:   Rash of unknown origin: Started on 6/4, 2 days after starting Epclusa for hep C treatment.  Treated with Lamisil for tinea pedis which did not help.  Screened for autoimmune or vasculitic causes: CBC with differential normal, ANA, RF, CCP antibody, c-ANCA, p-ANCA, ESR, HIV, RPR normal. States it has been improving in the last couple days since starting prednisone 101m daily. ID states not related to starting Epclusa.   PERTINENT  PMH / PSH: HTN, T2DM, hep C, hepatocellular carcinoma, colon cancer  OBJECTIVE:  BP 128/75   Pulse 92   Temp 98.3 F (36.8 C)   Ht 5' 4" (1.626 m)   Wt 151 lb 12.8 oz (68.9 kg)   SpO2 97%   BMI 26.06 kg/m   General: NAD, pleasant, able to participate in exam Skin: Thickened, raised yellow/gray lesions with mixed in keloids and zones of hypopigmentation on the palmar surfaces and ventral forearm, anterior lower extremities, significant yellow crusting and peeling of feet bilaterally including the plantar surfaces, interdigit sparing           ASSESSMENT/PLAN:  Rash and nonspecific skin eruption Improved with recent steroid dosing.  Lab work (CBC with differential, ANA, RF, CCP antibody, CRP, p-ANCA, ESR, HIV, RPR) thus far normal. Punch biopsy and wound culture performed of left foot.  No sign of active infection and not suspecting infectious cause.  Provided mupirocin and wound care instructions.  Should heal appropriately given patient's glucose is still well controlled with sliding scale with CBGs around 120s.  Advised appropriate glucose control with CBGs less than 150.  Patient has already been referred to dermatology and would greatly benefit from this.  - Ambulatory referral to Dermatology - mupirocin ointment (BACTROBAN) 2 %; Apply once a day to biopsy portion of left foot - Dermatology pathology - WOUND CULTURE  Return if symptoms worsen or fail to improve. AWells Guiles DO 08/26/2021, 10:01 PM PGY-2,  [facility] [place]

## 2021-08-26 NOTE — Assessment & Plan Note (Addendum)
Improved with recent steroid dosing.  Lab work (CBC with differential, ANA, RF, CCP antibody, CRP, p-ANCA, ESR, HIV, RPR) thus far normal. Punch biopsy and wound culture performed of left foot.  No sign of active infection and not suspecting infectious cause.  Provided mupirocin and wound care instructions.  Should heal appropriately given patient's glucose is still well controlled with sliding scale with CBGs around 120s.  Advised appropriate glucose control with CBGs less than 150.  Patient has already been referred to dermatology and would greatly benefit from this.

## 2021-08-26 NOTE — Telephone Encounter (Signed)
Phone call placed to patient and her daughter today to inquire how the visit went this morning when [facility]. Daughter tells me that they did a biopsy and culture and was told that the results should be back in about 3 days.  They recommend that she be seen at the dermatology clinic with Dr. Benita Stabile with [facility] as soon as possible.  This referral was submitted by Dr. Gale Journey last week.  However patient's daughter had wanted to be seen at [facility] if possible as [facility] does not accept the [facility] discount. Informed patient and daughter that besides this residency clinic where she was seen today, the dermatology group in [place] known as [facility] is a private group and does not accept [facility] discount.  Given the severity of the issue that is going on right now, I suggest calling Dr/ Mariercheck's office and set up appt ASAP.  Will have to pay out of pocket.   Informed him also that I have touched base with Dr. Marin Olp.  I sent him a message yesterday and requested that he look at the pictures in my note.  He reports that she has been on the nivolumab for over year and has not had any issues with it.  He has never seen anything like this in any of the patient's on immunotherapy and usually if a rash develops it is macular and does not drain.  He plans to hold the nivolumab for a few weeks. He too question whether Eplcusa may be the issue as rash started around that time.  I did send a message to Dr. Gale Journey also inquiring whether the [name] can be stopped.   So far patient states that with the prednisone, the rash appears to be drying up and itching has significantly decreased.  No new lesions are forming.  Pain has also decreased with the tramadol.  I plan to see her back in about 2 weeks and the goal is to taper the prednisone.  Hopefully by that time the results of the cryoglobulin and biopsy results would be back and she would have gotten an  appointment scheduled with dermatology at [facility]. Has appt with wound care tomorrow.

## 2021-08-26 NOTE — Patient Instructions (Addendum)
It was great to see you today! Thank you for choosing [facility] for your primary care. [facility] [name] was seen for rash.  Today we addressed: Please continue taking the prednisone and controlling your sugar.  We have biopsied this and will send this off for culture and pathology.  Additionally, I have submitted a referral for an urgent dermatology appointment.  I am prescribing mupirocin ointment to apply over the area that we biopsied.  You may also use Cetaphil or CeraVe or Vaseline ointment for the dry portions of her skin.  If you haven't already, sign up for My Chart to have easy access to your labs results, and communication with your primary care physician.  You should return to our clinic Return if symptoms worsen or fail to improve.  I recommend that you always bring your medications to each appointment as this makes it easy to ensure you are on the correct medications and helps [place] not miss refills when you need them.  Please arrive 15 minutes before your appointment to ensure smooth check in process.  We appreciate your efforts in making this happen.  Please call the clinic at [phone] if your symptoms worsen or you have any concerns.  Thank you for allowing me to participate in your care, Wells Guiles, DO 08/26/2021, 9:43 AM PGY-2, [place]

## 2021-08-27 ENCOUNTER — Other Ambulatory Visit: Payer: Self-pay

## 2021-08-27 ENCOUNTER — Encounter (HOSPITAL_BASED_OUTPATIENT_CLINIC_OR_DEPARTMENT_OTHER): Payer: Self-pay | Attending: Internal Medicine | Admitting: Internal Medicine

## 2021-08-27 DIAGNOSIS — L97512 Non-pressure chronic ulcer of other part of right foot with fat layer exposed: Secondary | ICD-10-CM | POA: Insufficient documentation

## 2021-08-27 DIAGNOSIS — C220 Liver cell carcinoma: Secondary | ICD-10-CM | POA: Insufficient documentation

## 2021-08-27 DIAGNOSIS — L97522 Non-pressure chronic ulcer of other part of left foot with fat layer exposed: Secondary | ICD-10-CM | POA: Insufficient documentation

## 2021-08-27 DIAGNOSIS — B182 Chronic viral hepatitis C: Secondary | ICD-10-CM | POA: Insufficient documentation

## 2021-08-27 MED ORDER — DAKINS (1/4 STRENGTH) 0.125 % EX SOLN
CUTANEOUS | 3 refills | Status: DC
  Filled 2021-09-02: qty 473, 30d supply, fill #0

## 2021-08-28 ENCOUNTER — Inpatient Hospital Stay: Payer: Self-pay

## 2021-08-28 ENCOUNTER — Other Ambulatory Visit (HOSPITAL_COMMUNITY): Payer: Self-pay

## 2021-08-28 ENCOUNTER — Inpatient Hospital Stay: Payer: Self-pay | Admitting: Family

## 2021-08-28 NOTE — Progress Notes (Signed)
LULLA, LINVILLE ([id number]) Visit Report for 08/27/2021 Chief Complaint Document Details Patient Name: Date of Service: [name], [name] [name] 08/27/2021 8:00 A M Medical Record Number: [id number] Patient Account Number: [id number] Date of Birth/Sex: Treating RN: 03-May-1945 (76 y.o. [name], [name] Primary Care Provider: Karle Plumber Other Clinician: Referring Provider: Treating Provider/Extender: Sammuel Bailiff in Treatment: 0 Information Obtained from: Patient Chief Complaint 08/27/2021; multiple open wounds to the feet bilaterally Electronic Signature(s) Signed: 08/27/2021 1:08:16 PM By: Kalman Shan DO Entered By: Kalman Shan on 08/27/2021 10:28:24 -------------------------------------------------------------------------------- Debridement Details Patient Name: Date of Service: [name], [name] [name] 08/27/2021 8:00 A M Medical Record Number: [id number] Patient Account Number: [id number] Date of Birth/Sex: Treating RN: 01/27/1946 (76 y.o. [name], [name] Primary Care Provider: Karle Plumber Other Clinician: Referring Provider: Treating Provider/Extender: Sammuel Bailiff in Treatment: 0 Debridement Performed for Assessment: Wound #1 Right,Circumferential Foot Performed By: Physician Kalman Shan, DO Debridement Type: Debridement Level of Consciousness (Pre-procedure): Awake and Alert Pre-procedure Verification/Time Out Yes - 09:50 Taken: Start Time: 09:50 Pain Control: Lidocaine T Area Debrided (L x W): otal 17 (cm) x 22 (cm) = 374 (cm) Tissue and other material debrided: Viable, Non-Viable, Callus, Slough, Subcutaneous, Skin: Dermis , Skin: Epidermis, Slough Level: Skin/Subcutaneous Tissue Debridement Description: Excisional Instrument: Blade, Forceps, Scissors Bleeding: Minimum Hemostasis Achieved: Pressure End Time: 09:50 Procedural Pain: 0 Post Procedural  Pain: 0 Response to Treatment: Procedure was tolerated well Level of Consciousness (Post- Awake and Alert procedure): Post Debridement Measurements of Total Wound Length: (cm) 17 Width: (cm) 22 Depth: (cm) 0.2 Volume: (cm) 58.748 Character of Wound/Ulcer Post Debridement: Improved Post Procedure Diagnosis Same as Pre-procedure Electronic Signature(s) Signed: 08/27/2021 1:08:16 PM By: Kalman Shan DO Signed: 08/28/2021 11:57:18 AM By: Rhae Hammock RN Entered By: Rhae Hammock on 08/27/2021 09:49:49 -------------------------------------------------------------------------------- Debridement Details Patient Name: Date of Service: [name], [name] [name] 08/27/2021 8:00 A M Medical Record Number: [id number] Patient Account Number: [id number] Date of Birth/Sex: Treating RN: 06/20/1945 (76 y.o. [name], [name] Primary Care Provider: Karle Plumber Other Clinician: Referring Provider: Treating Provider/Extender: Sammuel Bailiff in Treatment: 0 Debridement Performed for Assessment: Wound #2 Right,Circumferential Foot Performed By: Physician Kalman Shan, DO Debridement Type: Debridement Level of Consciousness (Pre-procedure): Awake and Alert Pre-procedure Verification/Time Out Yes - 09:50 Taken: Start Time: 09:50 Pain Control: Lidocaine T Area Debrided (L x W): otal 17 (cm) x 22 (cm) = 374 (cm) Tissue and other material debrided: Viable, Non-Viable, Callus, Slough, Subcutaneous, Skin: Dermis , Skin: Epidermis, Slough Level: Skin/Subcutaneous Tissue Debridement Description: Excisional Instrument: Blade, Forceps, Scissors Bleeding: Minimum Hemostasis Achieved: Pressure End Time: 09:50 Procedural Pain: 0 Post Procedural Pain: 0 Response to Treatment: Procedure was tolerated well Level of Consciousness (Post- Awake and Alert procedure): Post Debridement Measurements of Total Wound Length: (cm) 17 Width: (cm) 22 Depth:  (cm) 0.2 Volume: (cm) 58.748 Character of Wound/Ulcer Post Debridement: Improved Post Procedure Diagnosis Same as Pre-procedure Electronic Signature(s) Signed: 08/27/2021 1:08:16 PM By: Kalman Shan DO Signed: 08/28/2021 11:57:18 AM By: Rhae Hammock RN Entered By: Rhae Hammock on 08/27/2021 09:50:47 -------------------------------------------------------------------------------- HPI Details Patient Name: Date of Service: [name], [name] [name] 08/27/2021 8:00 A M Medical Record Number: [id number] Patient Account Number: [id number] Date of Birth/Sex: Treating RN: 1945/09/13 (76 y.o. [name], [name] Primary Care Provider: Karle Plumber Other Clinician: Referring Provider: Treating Provider/Extender: Sammuel Bailiff in Treatment: 0  History of Present Illness HPI Description: 08/27/2021 Ms. 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 Patient states that the rash started on her right foot on 07/19/2021. 2 days prior to this she was started on Epclusa for treatment of hep C. She was originally treated for potential tinea pedis with Lamisil however this did not improve symptoms. She developed worsening wounds and rash and was started on prednisone 40 mg daily by her primary care physician. Patient reports improvement in symptoms. She is going to see dermatology on 09/09/2021. She is using betamethasone cream to the rash. She had blood work done by her PCP that included CCP, sed rate, ANA and RF. All results within normal limits. Patient currently denies signs of infection. Patient states that infectious disease does not think this is caused by the [place]. Electronic Signature(s) Signed: 08/27/2021  1:08:16 PM By: Kalman Shan DO Entered By: Kalman Shan on 08/27/2021 12:45:56 -------------------------------------------------------------------------------- Physical Exam Details Patient Name: Date of Service: [name], [name] [name] 08/27/2021 8:00 A M Medical Record Number: [id number] Patient Account Number: [id number] Date of Birth/Sex: Treating RN: October 23, 1945 (76 y.o. [name], [name] Primary Care Provider: Karle Plumber Other Clinician: Referring Provider: Treating Provider/Extender: Sammuel Bailiff in Treatment: 0 Constitutional respirations regular, non-labored and within target range for patient.. Cardiovascular 2+ dorsalis pedis/posterior tibialis pulses. Psychiatric pleasant and cooperative. Notes Multiple scattered open wounds with nonviable tissue throughout the surface. Mild odor. No signs of surrounding infection. Electronic Signature(s) Signed: 08/27/2021 1:08:16 PM By: Kalman Shan DO Entered By: Kalman Shan on 08/27/2021 12:46:23 -------------------------------------------------------------------------------- Physician Orders Details Patient Name: Date of Service: [name], [name] [name] 08/27/2021 8:00 A M Medical Record Number: [id number] Patient Account Number: [id number] Date of Birth/Sex: Treating RN: 08/20/1945 (76 y.o. [name], [name] Primary Care Provider: Karle Plumber Other Clinician: Referring Provider: Treating Provider/Extender: Sammuel Bailiff in Treatment: 0 Verbal / Phone Orders: No Diagnosis Coding Follow-up Appointments ppointment in 2 weeks. - July 27th @ 1000 w/ Dr. Martie Round and Allayne Butcher Room # 9 Return A Other: - F/U with dermatology/PCP/ Infectious Disease Bathing/ Shower/ Hygiene May shower with protection but do not get wound dressing(s) wet. Non Wound Condition Other Non Wound Condition Orders/Instructions: - Continue the betaclomethazone  cream to legs and hands. Wound Treatment Wound #1 - Foot Wound Laterality: Left, Circumferential Cleanser: Wound Cleanser (DME) (Generic) 1 x Per Day/30 Days Discharge Instructions: Cleanse the wound with wound cleanser prior to applying a clean dressing using gauze sponges, not tissue or cotton balls. Prim Dressing: Dakin's Solution 0.25%, 16 (oz) (DME) (Generic) 1 x Per Day/30 Days ary Discharge Instructions: Moisten gauze with Dakin's solution Secondary Dressing: Woven Gauze Sponge, Non-Sterile 4x4 in (DME) (Generic) 1 x Per Day/30 Days Discharge Instructions: Apply over primary dressing as directed. Secondary Dressing: Zetuvit Plus Silicone Border Dressing 7x7(in/in) (DME) (Generic) 1 x Per Day/30 Days Discharge Instructions: Apply silicone border over primary dressing as directed. Secured With: [employer], 4.5x3.1 (in/yd) (DME) (Generic) 1 x Per Day/30 Days Discharge Instructions: Secure with Kerlix as directed. Secured With: 5M Medipore H Soft Cloth Surgical T ape, 4 x 10 (in/yd) (DME) (Generic) 1 x Per Day/30 Days Discharge Instructions: Secure with tape as directed. Secured With: Software [employer]  Net Size 5, 10 (yds) (DME) (Generic) 1 x Per Day/30 Days Wound #2 - Foot Wound Laterality: Right, Circumferential Cleanser: Wound Cleanser (DME) (Generic) 1 x Per Day/30 Days Discharge Instructions: Cleanse the wound with wound cleanser prior to applying a clean dressing using gauze sponges, not tissue or cotton balls. Prim Dressing: Dakin's Solution 0.25%, 16 (oz) (DME) (Generic) 1 x Per Day/30 Days ary Discharge Instructions: Moisten gauze with Dakin's solution Secondary Dressing: Woven Gauze Sponge, Non-Sterile 4x4 in (DME) (Generic) 1 x Per Day/30 Days Discharge Instructions: Apply over primary dressing as directed. Secondary Dressing: Zetuvit Plus Silicone Border Dressing 7x7(in/in) (DME) (Generic) 1 x Per Day/30 Days Discharge Instructions: Apply silicone border over primary dressing  as directed. Secured With: [employer], 4.5x3.1 (in/yd) (DME) (Generic) 1 x Per Day/30 Days Discharge Instructions: Secure with Kerlix as directed. Secured With: 52M Medipore H Soft Cloth Surgical T ape, 4 x 10 (in/yd) (DME) (Generic) 1 x Per Day/30 Days Discharge Instructions: Secure with tape as directed. Secured With: [employer] Size 5, 10 (yds) (DME) (Generic) 1 x Per Day/30 Days Electronic Signature(s) Signed: 08/27/2021 1:08:16 PM By: Kalman Shan DO Previous Signature: 08/27/2021 9:55:36 AM Version By: Kalman Shan DO Entered By: Kalman Shan on 08/27/2021 12:46:37 -------------------------------------------------------------------------------- Problem List Details Patient Name: Date of Service: [name], [name] [name] 08/27/2021 8:00 A M Medical Record Number: [id number] Patient Account Number: [id number] Date of Birth/Sex: Treating RN: 27-May-1945 (76 y.o. [name], [name] Primary Care Provider: Karle Plumber Other Clinician: Referring Provider: Treating Provider/Extender: Sammuel Bailiff in Treatment: 0 Active Problems ICD-10 Encounter Code Description Active Date MDM Diagnosis [phone] Non-pressure chronic ulcer of other part of right foot with fat layer exposed 08/27/2021 No Yes L97.522 Non-pressure chronic ulcer of other part of left foot with fat layer exposed 08/27/2021 No Yes 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 08/27/2021 No Yes C22.0 Liver cell carcinoma 08/27/2021 No Yes E11.628 Type 2 diabetes mellitus with other skin complications 05/14/760 No Yes E11.621 Type 2 diabetes mellitus with foot ulcer 08/27/2021 No Yes Inactive Problems Resolved Problems Electronic Signature(s) Signed: 08/27/2021 1:08:16 PM By: Kalman Shan DO Entered By: Kalman Shan on 08/27/2021 10:27:49 -------------------------------------------------------------------------------- Progress Note Details Patient Name: Date of Service: A  SEGHA NKA FO NDZEYUF, [name] [name] 08/27/2021 8:00 A M Medical Record Number: [id number] Patient Account Number: [id number] Date of Birth/Sex: Treating RN: Oct 28, 1945 (76 y.o. [name], [name] Primary Care Provider: Karle Plumber Other Clinician: Referring Provider: Treating Provider/Extender: Sammuel Bailiff in Treatment: 0 Subjective Chief Complaint Information obtained from Patient 08/27/2021; multiple open wounds to the feet bilaterally History of Present Illness (HPI) 08/27/2021 Ms. 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 Patient states that the rash started on her right foot on 07/19/2021. 2 days prior to this she was started on Epclusa for treatment of hep C. She was originally treated for potential tinea pedis with Lamisil however this did not improve symptoms. She developed worsening wounds and rash and was started on prednisone 40 mg daily by her primary care physician. Patient reports improvement in symptoms. She is going to see dermatology on 09/09/2021. She is using betamethasone cream to the rash. She had blood work done by her PCP that included CCP, sed rate,  ANA and RF. All results within normal limits. Patient currently denies signs of infection. Patient states that infectious disease does not think this is caused by the [place]. Patient History Information obtained from Patient, Chart. Allergies Bactrim, clarithromycin, amoxicillin Family History Unknown History. Social History Never smoker, Marital Status - Widowed, Alcohol Use - Never, Drug Use - No History, Caffeine Use - Rarely. Medical History Eyes Patient has history of Glaucoma Cardiovascular Patient has history of  Hypertension Gastrointestinal Patient has history of Hepatitis C Endocrine Patient has history of Type II Diabetes Neurologic Patient has history of Neuropathy Patient is treated with Insulin, Oral Agents. Blood sugar is tested. Blood sugar results noted at the following times: Breakfast - 90, Dinner - 150. Medical A Surgical History Notes nd Immunological on immunotherapy for the hepatitis carcinoma x 1 year; on epclusa for the hep c x 1 month Oncologic hepatocarcinoma Review of Systems (ROS) Constitutional Symptoms (General Health) Denies complaints or symptoms of Fatigue, Fever, Chills, Marked Weight Change. Eyes Denies complaints or symptoms of Dry Eyes, Vision Changes, Glasses / Contacts. Ear/Nose/Mouth/Throat Denies complaints or symptoms of Chronic sinus problems or rhinitis. Respiratory Denies complaints or symptoms of Chronic or frequent coughs, Shortness of Breath. Cardiovascular Denies complaints or symptoms of Chest pain. Gastrointestinal Denies complaints or symptoms of Frequent diarrhea, Nausea, Vomiting. Genitourinary Denies complaints or symptoms of Frequent urination. Integumentary (Skin) Complains or has symptoms of Wounds. Musculoskeletal Denies complaints or symptoms of Muscle Pain, Muscle Weakness. Neurologic Denies complaints or symptoms of Numbness/parasthesias. Psychiatric Denies complaints or symptoms of Claustrophobia, Suicidal. Objective Constitutional respirations regular, non-labored and within target range for patient.. Vitals Time Taken: 8:20 AM, Height: 64 in, Source: Stated, Weight: 150 lbs, Source: Stated, BMI: 25.7, Temperature: 98.4 F, Pulse: 91 bpm, Respiratory Rate: 17 breaths/min, Blood Pressure: 132/77 mmHg, Capillary Blood Glucose: 98 mg/dl. Cardiovascular 2+ dorsalis pedis/posterior tibialis pulses. Psychiatric pleasant and cooperative. General Notes: Multiple scattered open wounds with nonviable tissue throughout the surface.  Mild odor. No signs of surrounding infection. Integumentary (Hair, Skin) Wound #1 status is Open. Original cause of wound was Blister. The date acquired was: 07/16/2021. The wound is located on the Left,Circumferential Foot. The wound measures 17cm length x 22cm width x 0.2cm depth; 293.739cm^2 area and 58.748cm^3 volume. There is Fat Layer (Subcutaneous Tissue) exposed. There is no tunneling or undermining noted. There is a large amount of purulent drainage noted. Foul odor after cleansing was noted. The wound margin is distinct with the outline attached to the wound base. There is small (1-33%) red, pink granulation within the wound bed. There is a large (67-100%) amount of necrotic tissue within the wound bed including Eschar and Adherent Slough. Wound #2 status is Open. Original cause of wound was Blister. The date acquired was: 07/16/2021. The wound is located on the Right,Circumferential Foot. The wound measures 17cm length x 22cm width x 0.2cm depth; 293.739cm^2 area and 58.748cm^3 volume. There is Fat Layer (Subcutaneous Tissue) exposed. There is no tunneling or undermining noted. There is a large amount of purulent drainage noted. Foul odor after cleansing was noted. The wound margin is distinct with the outline attached to the wound base. There is small (1-33%) red, pink granulation within the wound bed. There is a large (67-100%) amount of necrotic tissue within the wound bed including Eschar and Adherent Slough. Assessment Active Problems ICD-10 Non-pressure chronic ulcer of other part of right foot with fat layer exposed Non-pressure chronic ulcer of other part of left foot with fat layer exposed Chronic  viral hepatitis C Liver cell carcinoma Type 2 diabetes mellitus with other skin complications Type 2 diabetes mellitus with foot ulcer Patient presents with a 1 month history of worsening rash and wounds to her feet bilaterally. Etiology is unclear. This is either a reaction from her  hepatitis C medication vs vasculitis vs mixed cryoglobulinemia vs other autoimmune disorder. Her primary care physician obtained a biopsy of the foot on 7/10. Results pending. She also obtained blood work including CCP, sed rate, RF, ANA, c-ANCA and cryoglobulin. Cryoglobulin and c-ANCA are pending. All other results are within normal limits. HIV and RPR are nonreactive. We will provide recommendations on wound care however the underlying etiology for these wounds needs to be addressed. She is seeing dermatology on 7/26 for this issue. For now I recommended Dakin's wet-to-dry dressings T the open wounds. She can o use betamethasone cream that was prescribed by her infectious disease doctor on her legs and hands. I debrided nonviable tissue. No signs of surrounding infection. Follow-up in 2 weeks. Procedures Wound #1 Pre-procedure diagnosis of Wound #1 is a T be determined located on the Right,Circumferential Foot . There was a Excisional Skin/Subcutaneous Tissue o Debridement with a total area of 374 sq cm performed by Kalman Shan, DO. With the following instrument(s): Blade, Forceps, and Scissors to remove Viable and Non-Viable tissue/material. Material removed includes Callus, Subcutaneous Tissue, Slough, Skin: Dermis, and Skin: Epidermis after achieving pain control using Lidocaine. A time out was conducted at 09:50, prior to the start of the procedure. A Minimum amount of bleeding was controlled with Pressure. The procedure was tolerated well with a pain level of 0 throughout and a pain level of 0 following the procedure. Post Debridement Measurements: 17cm length x 22cm width x 0.2cm depth; 58.748cm^3 volume. Character of Wound/Ulcer Post Debridement is improved. Post procedure Diagnosis Wound #1: Same as Pre-Procedure Wound #2 Pre-procedure diagnosis of Wound #2 is a T be determined located on the Right,Circumferential Foot . There was a Excisional Skin/Subcutaneous  Tissue o Debridement with a total area of 374 sq cm performed by Kalman Shan, DO. With the following instrument(s): Blade, Forceps, and Scissors to remove Viable and Non-Viable tissue/material. Material removed includes Callus, Subcutaneous Tissue, Slough, Skin: Dermis, and Skin: Epidermis after achieving pain control using Lidocaine. No specimens were taken. A time out was conducted at 09:50, prior to the start of the procedure. A Minimum amount of bleeding was controlled with Pressure. The procedure was tolerated well with a pain level of 0 throughout and a pain level of 0 following the procedure. Post Debridement Measurements: 17cm length x 22cm width x 0.2cm depth; 58.748cm^3 volume. Character of Wound/Ulcer Post Debridement is improved. Post procedure Diagnosis Wound #2: Same as Pre-Procedure Plan Follow-up Appointments: Return Appointment in 2 weeks. - July 27th @ 1000 w/ Dr. Martie Round and Allayne Butcher Room # 9 Other: - F/U with dermatology/PCP/ Infectious Disease Bathing/ Shower/ Hygiene: May shower with protection but do not get wound dressing(s) wet. Non Wound Condition: Other Non Wound Condition Orders/Instructions: - Continue the betaclomethazone cream to legs and hands. WOUND #1: - Foot Wound Laterality: Left, Circumferential Cleanser: Wound Cleanser (DME) (Generic) 1 x Per Day/30 Days Discharge Instructions: Cleanse the wound with wound cleanser prior to applying a clean dressing using gauze sponges, not tissue or cotton balls. Prim Dressing: Dakin's Solution 0.25%, 16 (oz) (DME) (Generic) 1 x Per Day/30 Days ary Discharge Instructions: Moisten gauze with Dakin's solution Secondary Dressing: Woven Gauze Sponge, Non-Sterile 4x4 in (DME) (Generic) 1 x  Per Day/30 Days Discharge Instructions: Apply over primary dressing as directed. Secondary Dressing: Zetuvit Plus Silicone Border Dressing 7x7(in/in) (DME) (Generic) 1 x Per Day/30 Days Discharge Instructions: Apply silicone border over  primary dressing as directed. Secured With: [employer], 4.5x3.1 (in/yd) (DME) (Generic) 1 x Per Day/30 Days Discharge Instructions: Secure with Kerlix as directed. Secured With: 12M Medipore H Soft Cloth Surgical T ape, 4 x 10 (in/yd) (DME) (Generic) 1 x Per Day/30 Days Discharge Instructions: Secure with tape as directed. Secured With: [employer] Size 5, 10 (yds) (DME) (Generic) 1 x Per Day/30 Days WOUND #2: - Foot Wound Laterality: Right, Circumferential Cleanser: Wound Cleanser (DME) (Generic) 1 x Per Day/30 Days Discharge Instructions: Cleanse the wound with wound cleanser prior to applying a clean dressing using gauze sponges, not tissue or cotton balls. Prim Dressing: Dakin's Solution 0.25%, 16 (oz) (DME) (Generic) 1 x Per Day/30 Days ary Discharge Instructions: Moisten gauze with Dakin's solution Secondary Dressing: Woven Gauze Sponge, Non-Sterile 4x4 in (DME) (Generic) 1 x Per Day/30 Days Discharge Instructions: Apply over primary dressing as directed. Secondary Dressing: Zetuvit Plus Silicone Border Dressing 7x7(in/in) (DME) (Generic) 1 x Per Day/30 Days Discharge Instructions: Apply silicone border over primary dressing as directed. Secured With: [employer], 4.5x3.1 (in/yd) (DME) (Generic) 1 x Per Day/30 Days Discharge Instructions: Secure with Kerlix as directed. Secured With: 12M [employer]ipore H Soft Cloth Surgical T ape, 4 x 10 (in/yd) (DME) (Generic) 1 x Per Day/30 Days Discharge Instructions: Secure with tape as directed. Secured With: [employer] Net Size 5, 10 (yds) (DME) (Generic) 1 x Per Day/30 Days 1. Dakin's wet-to-dry dressings 2. Called to move up her dermatology appointment and she is able to be seen on 7/20. 3. Follow-up in 2 weeks 4. In office sharp debridement Electronic Signature(s) Signed: 08/27/2021 1:08:16 PM By: Kalman Shan DO Entered By: Kalman Shan on 08/27/2021  12:58:28 -------------------------------------------------------------------------------- HxROS Details Patient Name: Date of Service: [name], [name] [name] 08/27/2021 8:00 A M Medical Record Number: [id number] Patient Account Number: [id number] Date of Birth/Sex: Treating RN: [birth year] (76 y.o. [name], [name] Primary Care Provider: Karle Plumber Other Clinician: Referring Provider: Treating Provider/Extender: Sammuel Bailiff in Treatment: 0 Information Obtained From Patient Chart Constitutional Symptoms (General Health) Complaints and Symptoms: Negative for: Fatigue; Fever; Chills; Marked Weight Change Eyes Complaints and Symptoms: Negative for: Dry Eyes; Vision Changes; Glasses / Contacts Medical History: Positive for: Glaucoma Ear/Nose/Mouth/Throat Complaints and Symptoms: Negative for: Chronic sinus problems or rhinitis Respiratory Complaints and Symptoms: Negative for: Chronic or frequent coughs; Shortness of Breath Cardiovascular Complaints and Symptoms: Negative for: Chest pain Medical History: Positive for: Hypertension Gastrointestinal Complaints and Symptoms: Negative for: Frequent diarrhea; Nausea; Vomiting Medical History: Positive for: Hepatitis C Genitourinary Complaints and Symptoms: Negative for: Frequent urination Integumentary (Skin) Complaints and Symptoms: Positive for: Wounds Musculoskeletal Complaints and Symptoms: Negative for: Muscle Pain; Muscle Weakness Neurologic Complaints and Symptoms: Negative for: Numbness/parasthesias Medical History: Positive for: Neuropathy Psychiatric Complaints and Symptoms: Negative for: Claustrophobia; Suicidal Hematologic/Lymphatic Endocrine Medical History: Positive for: Type II Diabetes Treated with: Insulin, Oral agents Blood sugar tested every day: Yes Tested : Blood sugar testing results: Breakfast: 90; Dinner: 150 Immunological Medical  History: Past Medical History Notes: on immunotherapy for the hepatitis carcinoma x 1 year; on epclusa for the hep c x 1 month Oncologic Medical History: Past Medical History Notes: hepatocarcinoma HBO Extended History Items Eyes: Glaucoma Immunizations Pneumococcal Vaccine: Received Pneumococcal Vaccination: Yes Received Pneumococcal Vaccination On  or After 60th Birthday: Yes Implantable Devices Yes Family and Social History Unknown History: Yes; Never smoker; Marital Status - Widowed; Alcohol Use: Never; Drug Use: No History; Caffeine Use: Rarely; Financial Concerns: No; Food, Clothing or Shelter Needs: No; Support System Lacking: No; Transportation Concerns: No Electronic Signature(s) Signed: 08/27/2021 1:08:16 PM By: Kalman Shan DO Signed: 08/28/2021 11:57:18 AM By: Rhae Hammock RN Entered By: Rhae Hammock on 08/27/2021 08:38:21 -------------------------------------------------------------------------------- SuperBill Details Patient Name: Date of Service: [name], [name] [name] 08/27/2021 Medical Record Number: [id number] Patient Account Number: [id number] Date of Birth/Sex: Treating RN: 09-Jul-1945 (76 y.o. 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 C22.0 Liver cell carcinoma Facility Procedures CPT4 Code: [id number] Description: [phone] - WOUND CARE VISIT-LEV 5 EST PT Modifier: Quantity: 1 CPT4 Code: [id number] Description: [place] - DEB SUBQ TISSUE 20 SQ CM/< ICD-10 Diagnosis Description L97.512 Non-pressure chronic ulcer of other part of right foot with fat layer  exposed L97.522 Non-pressure chronic ulcer of other part of left foot with fat layer exposed Modifier: Quantity: 1 CPT4 Code: [id number] Description: [place] - DEB SUBQ TISS EA ADDL 20CM ICD-10 Diagnosis Description 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 Modifier: Quantity: 37 Physician Procedures : CPT4 Code Description Modifier [id number] - WC PHYS LEVEL 4 - NEW PT ICD-10 Diagnosis Description L97.512 Non-pressure chronic ulcer of other part of right foot with fat layer exposed L97.522 Non-pressure chronic ulcer of other part of left foot  with fat layer exposed 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 C22.0 Liver cell carcinoma Quantity: 1 : [id number] - WC PHYS SUBQ TISS 20 SQ CM ICD-10 Diagnosis Description 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

## 2021-08-28 NOTE — Progress Notes (Signed)
[name], [name] ([id number]) Visit Report for 08/27/2021 Abuse Risk Screen Details Patient Name: Date of Service: A SEGHA NKA FO [name] [name] 08/27/2021 8:00 A M Medical Record Number: [id number] Patient Account Number: [id number] Date of Birth/Sex: Treating RN: 1945-06-24 (76 y.o. [name] [name] Primary Care Shavona Gunderman: Karle Plumber Other Clinician: Referring Raenette Sakata: Treating Aissata Wilmore/Extender: Sammuel Bailiff in Treatment: 0 Abuse Risk Screen Items Answer ABUSE RISK SCREEN: Has anyone close to you tried to hurt or harm you recentlyo No Do you feel uncomfortable with anyone in your familyo No Has anyone forced you do things that you didnt want to doo No Electronic Signature(s) Signed: 08/28/2021 11:57:18 AM By: Rhae Hammock RN Entered By: Rhae Hammock on 08/27/2021 08:38:35 -------------------------------------------------------------------------------- Activities of Daily Living Details Patient Name: Date of Service: A SEGHA NKA FO [name] [name] 08/27/2021 8:00 A M Medical Record Number: [id number] Patient Account Number: [id number] Date of Birth/Sex: Treating RN: October 18, 1945 (76 y.o. [name] [name] Primary Care Norberta Stobaugh: Karle Plumber Other Clinician: Referring Yuvraj Pfeifer: Treating Jeramey Lanuza/Extender: Sammuel Bailiff in Treatment: 0 Activities of Daily Living Items Answer Activities of Daily Living (Please select one for each item) Drive Automobile Not Able T Medications ake Completely Able Use T elephone Completely Able Care for Appearance Completely Able Use T oilet Completely Able Bath / Shower Completely Able Dress Self Completely Able Feed Self Completely Able Walk Completely Able Get In / Out Bed Completely Able Housework Completely Able Prepare Meals Completely [place] for Self Completely Able Electronic Signature(s) Signed: 08/28/2021  11:57:18 AM By: Rhae Hammock RN Entered By: Rhae Hammock on 08/27/2021 08:39:10 -------------------------------------------------------------------------------- Education Screening Details Patient Name: Date of Service: A SEGHA NKA FO [name] [name] 08/27/2021 8:00 A M Medical Record Number: [id number] Patient Account Number: [id number] Date of Birth/Sex: Treating RN: [birth year] (76 y.o. [name] [name] Primary Care Deetta Siegmann: Karle Plumber Other Clinician: Referring Mykenzie Ebanks: Treating Jeselle Hiser/Extender: Sammuel Bailiff in Treatment: 0 Primary Learner Assessed: Patient Learning Preferences/Education Level/Primary Language Learning Preference: Explanation, Demonstration, Communication Board, Printed Material Highest Education Level: College or Above Preferred Language: English Cognitive Barrier Language Barrier: No Translator Needed: No Memory Deficit: No Emotional Barrier: No Cultural/Religious Beliefs Affecting Medical Care: No Physical Barrier Impaired Vision: Yes Glasses, glaucoma Impaired Hearing: No Decreased Hand dexterity: No Knowledge/Comprehension Knowledge Level: High Comprehension Level: High Ability to understand written instructions: High Ability to understand verbal instructions: High Motivation Anxiety Level: Calm Cooperation: Cooperative Education Importance: Denies Need Interest in Health Problems: Asks Questions Perception: Coherent Willingness to Engage in Self-Management High Activities: Readiness to Engage in Self-Management High Activities: Electronic Signature(s) Signed: 08/28/2021 11:57:18 AM By: Rhae Hammock RN Entered By: Rhae Hammock on 08/27/2021 08:40:01 -------------------------------------------------------------------------------- Fall Risk Assessment Details Patient Name: Date of Service: A SEGHA NKA FO [name] [name] 08/27/2021 8:00 A M Medical Record Number:  [id number] Patient Account Number: [id number] Date of Birth/Sex: Treating RN: 09-01-1945 (76 y.o. [name] [name] Primary Care Toby Ayad: Karle Plumber Other Clinician: Referring Caron Ode: Treating Lucciana Head/Extender: Sammuel Bailiff in Treatment: 0 Fall Risk Assessment Items Have you had 2 or more falls in the last 12 monthso 0 No Have you had any fall that resulted in injury in the last 12 monthso 0 No FALLS RISK SCREEN History of falling - immediate or within 3 months 0 No Secondary diagnosis (Do you have 2 or more medical diagnoseso) 0 No Ambulatory aid None/bed rest/wheelchair/nurse 0 No Crutches/cane/walker  0 No Furniture 0 No Intravenous therapy Access/Saline/Heparin Lock 0 No Gait/Transferring Normal/ bed rest/ wheelchair 0 No Weak (short steps with or without shuffle, stooped but able to lift head while walking, may seek 0 No support from furniture) Impaired (short steps with shuffle, may have difficulty arising from chair, head down, impaired 0 No balance) Mental Status Oriented to own ability 0 No Electronic Signature(s) Signed: 08/28/2021 11:57:18 AM By: Rhae Hammock RN Entered By: Rhae Hammock on 08/27/2021 08:40:06 -------------------------------------------------------------------------------- Foot Assessment Details Patient Name: Date of Service: A SEGHA NKA FO [name] [name] 08/27/2021 8:00 A M Medical Record Number: [id number] Patient Account Number: [id number] Date of Birth/Sex: Treating RN: 1945/03/30 (76 y.o. [name] [name] Primary Care Keylin Podolsky: Karle Plumber Other Clinician: Referring Lilyahna Sirmon: Treating Corri Delapaz/Extender: Sammuel Bailiff in Treatment: 0 Foot Assessment Items Site Locations + = Sensation present, - = Sensation absent, C = Callus, U = Ulcer R = Redness, W = Warmth, M = Maceration, PU = Pre-ulcerative lesion F = Fissure, S = Swelling, D =  Dryness Assessment Right: Left: Other Deformity: No No Prior Foot Ulcer: No No Prior Amputation: No No Charcot Joint: No No Ambulatory Status: Ambulatory Without Help Gait: Steady Electronic Signature(s) Signed: 08/28/2021 11:57:18 AM By: Rhae Hammock RN Entered By: Rhae Hammock on 08/27/2021 08:49:57 -------------------------------------------------------------------------------- Nutrition Risk Screening Details Patient Name: Date of Service: A SEGHA NKA FO [name] [name] 08/27/2021 8:00 A M Medical Record Number: [id number] Patient Account Number: [id number] Date of Birth/Sex: Treating RN: 23-Feb-1945 (76 y.o. [name] [name] Primary Care Antoine Vandermeulen: Karle Plumber Other Clinician: Referring Walker Sitar: Treating Stormy Sabol/Extender: Sammuel Bailiff in Treatment: 0 Height (in): 64 Weight (lbs): 150 Body Mass Index (BMI): 25.7 Nutrition Risk Screening Items Score Screening NUTRITION RISK SCREEN: I have an illness or condition that made me change the kind and/or amount of food I eat 0 No I eat fewer than two meals per day 0 No I eat few fruits and vegetables, or milk products 0 No I have three or more drinks of beer, liquor or wine almost every day 0 No I have tooth or mouth problems that make it hard for me to eat 0 No I don't always have enough money to buy the food I need 0 No I eat alone most of the time 0 No I take three or more different prescribed or over-the-counter drugs a day 0 No Without wanting to, I have lost or gained 10 pounds in the last six months 0 No I am not always physically able to shop, cook and/or feed myself 0 No Nutrition Protocols Good Risk Protocol 0 No interventions needed Moderate Risk Protocol High Risk Proctocol Risk Level: Good Risk Score: 0 Electronic Signature(s) Signed: 08/28/2021 11:57:18 AM By: Rhae Hammock RN Entered By: Rhae Hammock on 08/27/2021 08:40:09

## 2021-08-28 NOTE — Progress Notes (Signed)
ISYS, TIETJE ([id number]) Visit Report for 08/27/2021 Allergy List Details Patient Name: Date of Service: [name] [name] NKA FO [name], [name] [name] 08/27/2021 8:00 [name] [name] Medical Record Number: [id number] Patient Account Number: [id number] Date of Birth/Sex: Treating RN: [birth year] (76 y.o. [name] [name], [name] [name] Primary Care Colleen Donahoe: Karle Plumber Other Clinician: Referring Waniya Hoglund: Treating Haelee Bolen/Extender: Sammuel Bailiff in Treatment: 0 Allergies Active Allergies Bactrim clarithromycin amoxicillin Allergy Notes Electronic Signature(s) Signed: 08/28/2021 11:57:18 AM By: Rhae Hammock RN Entered By: Rhae Hammock on 08/27/2021 08:31:48 -------------------------------------------------------------------------------- Arrival Information Details Patient Name: Date of Service: [name] [name] NKA FO [name], [name] [name] 08/27/2021 8:00 [name] [name] Medical Record Number: [id number] Patient Account Number: [id number] Date of Birth/Sex: Treating RN: [birth year] (76 y.o. [name] [name], [name] [name] Primary Care Rayson Rando: Karle Plumber Other Clinician: Referring Miarose Lippert: Treating Zarinah Oviatt/Extender: Sammuel Bailiff in Treatment: 0 Visit Information Patient Arrived: Ambulatory Arrival Time: 08:17 Accompanied By: daughter Transfer Assistance: None Patient Identification Verified: Yes Secondary Verification Process Completed: Yes Patient Requires Transmission-Based Precautions: No Patient Has Alerts: No Electronic Signature(s) Signed: 08/28/2021 11:57:18 AM By: Rhae Hammock RN Entered By: Rhae Hammock on 08/27/2021 08:20:23 -------------------------------------------------------------------------------- Clinic Level of Care Assessment Details Patient Name: Date of Service: [name] [name] NKA FO [name], [name] [name] 08/27/2021 8:00 [name] [name] Medical Record Number: [id number] Patient Account Number: [id number] Date of Birth/Sex: Treating RN: Dec 17, 1945  (76 y.o. [name] [name], [name] [name] Primary Care Kaidon Kinker: Karle Plumber Other Clinician: Referring Elka Satterfield: Treating Kassadie Pancake/Extender: Sammuel Bailiff in Treatment: 0 Clinic Level of Care Assessment Items TOOL 3 Quantity Score X- 1 0 Use when EandM and Procedure is performed on FOLLOW-UP visit ASSESSMENTS - Nursing Assessment / Reassessment X- 1 10 Reassessment of Co-morbidities (includes updates in patient status) X- 1 5 Reassessment of Adherence to Treatment Plan ASSESSMENTS - Wound and Skin Assessment / Reassessment '[]'$  - Points for Wound Assessment can only be taken for [name] new wound of unknown or different etiology and [name] procedure is 0 NOT performed to that wound '[]'$  - 0 Simple Wound Assessment / Reassessment - one wound X- 2 5 Complex Wound Assessment / Reassessment - multiple wounds '[]'$  - 0 Dermatologic / Skin Assessment (not related to wound area) ASSESSMENTS - Focused Assessment X- 2 5 Circumferential Edema Measurements - multi extremities '[]'$  - 0 Nutritional Assessment / Counseling / Intervention '[]'$  - 0 Lower Extremity Assessment (monofilament, tuning fork, pulses) '[]'$  - 0 Peripheral Arterial Disease Assessment (using hand held doppler) ASSESSMENTS - Ostomy and/or Continence Assessment and Care '[]'$  - 0 Incontinence Assessment and Management '[]'$  - 0 Ostomy Care Assessment and Management (repouching, etc.) PROCESS - Coordination of Care '[]'$  - Points for Discharge Coordination can only be taken for [name] new wound of unknown or different etiology and [name] procedure 0 is NOT performed to that wound '[]'$  - 0 Simple Patient / Family Education for ongoing care X- 1 20 Complex (extensive) Patient / Family Education for ongoing care X- 1 10 Staff obtains Programmer, systems, Records, T Results / Process Orders est '[]'$  - 0 Staff telephones HHA, Nursing Homes / Clarify orders / etc '[]'$  - 0 Routine Transfer to another Facility (non-emergent condition) '[]'$  - 0 Routine  Hospital Admission (non-emergent condition) X- 1 15 New Admissions / Biomedical engineer / Ordering NPWT Apligraf, etc. , '[]'$  - 0 Emergency Hospital Admission (emergent condition) '[]'$  - 0 Simple Discharge Coordination X- 1 15 Complex (extensive) Discharge Coordination PROCESS - Special Needs '[]'$  - 0 Pediatric / Minor Patient Management '[]'$  -  0 Isolation Patient Management '[]'$  - 0 Hearing / Language / Visual special needs '[]'$  - 0 Assessment of Community assistance (transportation, D/C planning, etc.) '[]'$  - 0 Additional assistance / Altered mentation '[]'$  - 0 Support Surface(s) Assessment (bed, cushion, seat, etc.) INTERVENTIONS - Wound Cleansing / Measurement '[]'$  - Points for Wound Cleaning / Measurement, Wound Dressing, Specimen Collection and Specimen taken to lab can only 0 be taken for [name] new wound of unknown or different etiology and [name] procedure is NOT performed to that wound '[]'$  - 0 Simple Wound Cleansing - one wound X- 2 5 Complex Wound Cleansing - multiple wounds X- 1 5 Wound Imaging (photographs - any number of wounds) '[]'$  - 0 Wound Tracing (instead of photographs) '[]'$  - 0 Simple Wound Measurement - one wound X- 2 5 Complex Wound Measurement - multiple wounds INTERVENTIONS - Wound Dressings '[]'$  - 0 Small Wound Dressing one or multiple wounds X- 2 15 Medium Wound Dressing one or multiple wounds '[]'$  - 0 Large Wound Dressing one or multiple wounds INTERVENTIONS - Miscellaneous '[]'$  - 0 External ear exam '[]'$  - 0 Specimen Collection (cultures, biopsies, blood, body fluids, etc.) '[]'$  - 0 Specimen(s) / Culture(s) sent or taken to Lab for analysis '[]'$  - 0 Patient Transfer (multiple staff / Civil Service fast streamer / Similar devices) '[]'$  - 0 Simple Staple / Suture removal (25 or less) '[]'$  - 0 Complex Staple / Suture removal (26 or more) '[]'$  - 0 Hypo / Hyperglycemic Management (close monitor of Blood Glucose) X- 1 15 Ankle / Brachial Index (ABI) - do not check if billed separately X- 1  5 Vital Signs Has the patient been seen at the hospital within the last three years: Yes Total Score: 170 Level Of Care: New/Established - Level 5 Electronic Signature(s) Signed: 08/28/2021 11:57:18 AM By: Rhae Hammock RN Entered By: Rhae Hammock on 08/27/2021 10:18:02 -------------------------------------------------------------------------------- Encounter Discharge Information Details Patient Name: Date of Service: [name] [name] NKA FO [name], [name] [name] 08/27/2021 8:00 [name] [name] Medical Record Number: [id number] Patient Account Number: [id number] Date of Birth/Sex: Treating RN: [birth year] (76 y.o. [name] [name], [name] [name] Primary Care Kendal Raffo: Karle Plumber Other Clinician: Referring Kerby Hockley: Treating Brailen Macneal/Extender: Sammuel Bailiff in Treatment: 0 Encounter Discharge Information Items Post Procedure Vitals Discharge Condition: Stable Temperature (F): 98.7 Ambulatory Status: Ambulatory Pulse (bpm): 74 Discharge Destination: Home Respiratory Rate (breaths/min): 17 Transportation: Private Auto Blood Pressure (mmHg): 117/74 Accompanied By: daughter Schedule Follow-up Appointment: Yes Clinical Summary of Care: Patient Declined Electronic Signature(s) Signed: 08/28/2021 11:57:18 AM By: Rhae Hammock RN Entered By: Rhae Hammock on 08/27/2021 10:19:04 -------------------------------------------------------------------------------- Lower Extremity Assessment Details Patient Name: Date of Service: [name] [name] NKA FO [name], [name] [name] 08/27/2021 8:00 [name] [name] Medical Record Number: [id number] Patient Account Number: [id number] Date of Birth/Sex: Treating RN: 08/08/1945 (76 y.o. [name] [name], [name] [name] Primary Care Kahdijah Errickson: Karle Plumber Other Clinician: Referring Kristine Chahal: Treating Amilliana Hayworth/Extender: Sammuel Bailiff in Treatment: 0 Edema Assessment Assessed: Shirlyn Goltz: Yes] Patrice Paradise: Yes] Edema: [Left: Yes] [Right: Yes] Calf Left:  Right: Point of Measurement: 34 cm From Medial Instep 31 cm 31 cm Ankle Left: Right: Point of Measurement: 7 cm From Medial Instep 20 cm 20 cm Vascular Assessment Pulses: Dorsalis Pedis Palpable: [Left:Yes] [Right:Yes] Posterior Tibial Palpable: [Left:Yes] [Right:Yes] Blood Pressure: Brachial: [Left:132] [Right:132] Ankle: [Left:Dorsalis Pedis: 164 1.24] [Right:Dorsalis Pedis: 158 1.20] Electronic Signature(s) Signed: 08/28/2021 11:57:18 AM By: Rhae Hammock RN Entered By: Rhae Hammock on 08/27/2021 09:31:07 -------------------------------------------------------------------------------- Multi Wound Chart Details Patient Name: Date of Service:  [name] [name] NKA FO [name], [name] [name] 08/27/2021 8:00 [name] [name] Medical Record Number: [id number] Patient Account Number: [id number] Date of Birth/Sex: Treating RN: 1945-07-15 (76 y.o. [name] [name], [name] [name] Primary Care Benjamyn Hestand: Karle Plumber Other Clinician: Referring Anberlyn Feimster: Treating Pearlene Teat/Extender: Sammuel Bailiff in Treatment: 0 Vital Signs Height(in): 64 Capillary Blood Glucose(mg/dl): 98 Weight(lbs): 150 Pulse(bpm): 7 Body Mass Index(BMI): 25.7 Blood Pressure(mmHg): 132/77 Temperature(F): 98.4 Respiratory Rate(breaths/min): 17 Photos: [N/[name]:N/[name]] Right, Circumferential Foot Right, Circumferential Foot N/[name] Wound Location: Blister Blister N/[name] Wounding Event: T be determined o T be determined o N/[name] Primary Etiology: Glaucoma, Hypertension, Hepatitis C, Glaucoma, Hypertension, Hepatitis C, N/[name] Comorbid History: Type II Diabetes, Neuropathy Type II Diabetes, Neuropathy 07/16/2021 07/16/2021 N/[name] Date Acquired: 0 0 N/[name] Weeks of Treatment: Open Open N/[name] Wound Status: No No N/[name] Wound Recurrence: Yes Yes N/[name] Clustered Wound: 15 15 N/[name] Clustered Quantity: 17x22x0.2 17x22x0.2 N/[name] Measurements L x W x D (cm) 293.739 293.739 N/[name] [name] (cm) : rea 58.748 58.748 N/[name] Volume (cm) : 0.00% 0.00%  N/[name] % Reduction in Area: 0.00% 0.00% N/[name] % Reduction in Volume: Full Thickness With Exposed Support Full Thickness With Exposed Support N/[name] Classification: Structures Structures Large Large N/[name] Exudate [name] mount: Purulent Purulent N/[name] Exudate Type: yellow, brown, green yellow, brown, green N/[name] Exudate Color: Yes Yes N/[name] Foul Odor [name] Cleansing: fter No No N/[name] Odor Anticipated Due to Product Use: Distinct, outline attached Distinct, outline attached N/[name] Wound Margin: Small (1-33%) Small (1-33%) N/[name] Granulation [name] mount: Red, Pink Red, Pink N/[name] Granulation Quality: Large (67-100%) Large (67-100%) N/[name] Necrotic Amount: Eschar, Adherent Slough Eschar, Adherent Slough N/[name] Necrotic Tissue: Fat Layer (Subcutaneous Tissue): Yes Fat Layer (Subcutaneous Tissue): Yes N/[name] Exposed Structures: Fascia: No Fascia: No Tendon: No Tendon: No Muscle: No Muscle: No Joint: No Joint: No Bone: No Bone: No None None N/[name] Epithelialization: Debridement - Excisional Debridement - Excisional N/[name] Debridement: Pre-procedure Verification/Time Out 09:50 09:50 N/[name] Taken: Lidocaine Lidocaine N/[name] Pain Control: Callus, Subcutaneous, Slough Callus, Subcutaneous, Slough N/[name] Tissue Debrided: Skin/Subcutaneous Tissue Skin/Subcutaneous Tissue N/[name] Level: 374 374 N/[name] Debridement [name] (sq cm): rea Blade, Forceps, Scissors Blade, Forceps, Scissors N/[name] Instrument: Minimum Minimum N/[name] Bleeding: Pressure Pressure N/[name] Hemostasis [name] chieved: 0 0 N/[name] Procedural Pain: 0 0 N/[name] Post Procedural Pain: Procedure was tolerated well Procedure was tolerated well N/[name] Debridement Treatment Response: 17x22x0.2 17x22x0.2 N/[name] Post Debridement Measurements L x W x D (cm) 58.748 58.748 N/[name] Post Debridement Volume: (cm) Debridement Debridement N/[name] Procedures Performed: Treatment Notes Wound #1 (Foot) Wound Laterality: Left, Circumferential Cleanser Wound Cleanser Discharge Instruction: Cleanse the wound with wound  cleanser prior to applying [name] clean dressing using gauze sponges, not tissue or cotton balls. Peri-Wound Care Topical Primary Dressing Dakin's Solution 0.25%, 16 (oz) Discharge Instruction: Moisten gauze with Dakin's solution Secondary Dressing Woven Gauze Sponge, Non-Sterile 4x4 in Discharge Instruction: Apply over primary dressing as directed. Zetuvit Plus Silicone Border Dressing 7x7(in/in) Discharge Instruction: Apply silicone border over primary dressing as directed. Secured With [employer], 4.5x3.1 (in/yd) Discharge Instruction: Secure with Kerlix as directed. 85M Medipore H Soft Cloth Surgical T ape, 4 x 10 (in/yd) Discharge Instruction: Secure with tape as directed. Stretch Net Size 5, 10 (yds) Compression Wrap Compression Stockings Add-Ons Wound #2 (Foot) Wound Laterality: Right, Circumferential Cleanser Wound Cleanser Discharge Instruction: Cleanse the wound with wound cleanser prior to applying [name] clean dressing using gauze sponges, not tissue or cotton balls. Peri-Wound Care Topical Primary Dressing Dakin's Solution 0.25%, 16 (oz) Discharge Instruction: Moisten gauze with  Dakin's solution Secondary Dressing Woven Gauze Sponge, Non-Sterile 4x4 in Discharge Instruction: Apply over primary dressing as directed. Zetuvit Plus Silicone Border Dressing 7x7(in/in) Discharge Instruction: Apply silicone border over primary dressing as directed. Secured With [employer], 4.5x3.1 (in/yd) Discharge Instruction: Secure with Kerlix as directed. 57M Medipore H Soft Cloth Surgical T ape, 4 x 10 (in/yd) Discharge Instruction: Secure with tape as directed. Stretch Net Size 5, 10 (yds) Compression Wrap Compression Stockings Add-Ons Electronic Signature(s) Signed: 08/27/2021 1:08:16 PM By: Kalman Shan DO Signed: 08/28/2021 11:57:18 AM By: Rhae Hammock RN Entered By: Kalman Shan on 08/27/2021  10:27:57 -------------------------------------------------------------------------------- Multi-Disciplinary Care Plan Details Patient Name: Date of Service: [name] [name] NKA FO [name], [name] [name] 08/27/2021 8:00 [name] [name] Medical Record Number: [id number] Patient Account Number: [id number] Date of Birth/Sex: Treating RN: March 30, 1945 (76 y.o. [name] [name], [name] [name] Primary Care Rushawn Capshaw: Karle Plumber Other Clinician: Referring Kendrick Haapala: Treating Shauntell Iglesia/Extender: Sammuel Bailiff in Treatment: 0 Active Inactive Orientation to the Wound Care Program Nursing Diagnoses: Knowledge deficit related to the wound healing center program Goals: Patient/caregiver will verbalize understanding of the [facility] Program Date Initiated: 08/27/2021 Target Resolution Date: 09/18/2021 Goal Status: Active Interventions: Provide education on orientation to the wound center Notes: Wound/Skin Impairment Nursing Diagnoses: Impaired tissue integrity Knowledge deficit related to ulceration/compromised skin integrity Goals: Patient will have [name] decrease in wound volume by X% from date: (specify in notes) Date Initiated: 08/27/2021 Target Resolution Date: 09/18/2021 Goal Status: Active Patient/caregiver will verbalize understanding of skin care regimen Date Initiated: 08/27/2021 Target Resolution Date: 09/18/2021 Goal Status: Active Ulcer/skin breakdown will have [name] volume reduction of 30% by week 4 Date Initiated: 08/27/2021 Target Resolution Date: 09/18/2021 Goal Status: Active Interventions: Assess patient/caregiver ability to obtain necessary supplies Assess patient/caregiver ability to perform ulcer/skin care regimen upon admission and as needed Assess ulceration(s) every visit Notes: Electronic Signature(s) Signed: 08/28/2021 11:57:18 AM By: Rhae Hammock RN Entered By: Rhae Hammock on 08/27/2021  09:16:28 -------------------------------------------------------------------------------- Pain Assessment Details Patient Name: Date of Service: [name] [name] NKA FO [name], [name] [name] 08/27/2021 8:00 [name] [name] Medical Record Number: [id number] Patient Account Number: [id number] Date of Birth/Sex: Treating RN: [birth year] (76 y.o. [name] [name], [name] [name] Primary Care Jamyla Ard: Karle Plumber Other Clinician: Referring Brandyn Lowrey: Treating Chaya Dehaan/Extender: Sammuel Bailiff in Treatment: 0 Active Problems Location of Pain Severity and Description of Pain Patient Has Paino No Site Locations Pain Management and Medication Current Pain Management: Electronic Signature(s) Signed: 08/28/2021 11:57:18 AM By: Rhae Hammock RN Entered By: Rhae Hammock on 08/27/2021 08:40:20 -------------------------------------------------------------------------------- Patient/Caregiver Education Details Patient Name: Date of Service: [name] [name] NKA Mickel Fuchs, [name] [name] 7/13/2023andnbsp8:00 [place] Number: [id number] Patient Account Number: [id number] Date of Birth/Gender: Treating RN: 04/20/1945 (76 y.o. [name] [name], [name] [name] Primary Care Physician: Karle Plumber Other Clinician: Referring Physician: Treating Physician/Extender: Sammuel Bailiff in Treatment: 0 Education Assessment Education Provided To: Patient Education Topics Provided Welcome T [place]: o Methods: Explain/Verbal Responses: Reinforcements needed, State content correctly Electronic Signature(s) Signed: 08/28/2021 11:57:18 AM By: Rhae Hammock RN Entered By: Rhae Hammock on 08/27/2021 09:16:37 -------------------------------------------------------------------------------- Wound Assessment Details Patient Name: Date of Service: [name] [name] NKA FO [name], [name] [name] 08/27/2021 8:00 [name] [name] Medical Record Number: [id number] Patient Account Number: [id number] Date  of Birth/Sex: Treating RN: [birth year] (76 y.o. [name] [name], [name] [name] Primary Care Finnlee Silvernail: Karle Plumber Other Clinician: Referring Duffy Dantonio: Treating Marlicia Sroka/Extender: Sammuel Bailiff in Treatment: 0 Wound Status Wound Number:  1 Primary T be determined o Etiology: Wound Location: Right, Circumferential Foot Wound Status: Open Wounding Event: Blister Comorbid Glaucoma, Hypertension, Hepatitis C, Type II Diabetes, Date Acquired: 07/16/2021 History: Neuropathy Weeks Of Treatment: 0 Clustered Wound: Yes Photos Wound Measurements Length: (cm) 17 Width: (cm) 22 Depth: (cm) 0.2 Clustered Quantity: 15 Area: (cm) 293.739 Volume: (cm) 58.748 % Reduction in Area: 0% % Reduction in Volume: 0% Epithelialization: None Tunneling: No Undermining: No Wound Description Classification: Full Thickness With Exposed Support Structures Wound Margin: Distinct, outline attached Exudate Amount: Large Exudate Type: Purulent Exudate Color: yellow, brown, green Foul Odor After Cleansing: Yes Due to Product Use: No Slough/Fibrino Yes Wound Bed Granulation Amount: Small (1-33%) Exposed Structure Granulation Quality: Red, Pink Fascia Exposed: No Necrotic Amount: Large (67-100%) Fat Layer (Subcutaneous Tissue) Exposed: Yes Necrotic Quality: Eschar, Adherent Slough Tendon Exposed: No Muscle Exposed: No Joint Exposed: No Bone Exposed: No Electronic Signature(s) Signed: 08/27/2021 4:34:49 PM By: Deon Pilling RN, BSN Signed: 08/28/2021 11:57:18 AM By: Rhae Hammock RN Entered By: Deon Pilling on 08/27/2021 09:06:15 -------------------------------------------------------------------------------- Wound Assessment Details Patient Name: Date of Service: [name] [name] NKA FO [name], [name] [name] 08/27/2021 8:00 [name] [name] Medical Record Number: [id number] Patient Account Number: [id number] Date of Birth/Sex: Treating RN: [birth year] (76 y.o. [name] [name], [name] [name] Primary Care  Suleyman Ehrman: Karle Plumber Other Clinician: Referring Lekendrick Alpern: Treating Daronte Shostak/Extender: Sammuel Bailiff in Treatment: 0 Wound Status Wound Number: 2 Primary T be determined o Etiology: Wound Location: Right, Circumferential Foot Wound Status: Open Wounding Event: Blister Comorbid Glaucoma, Hypertension, Hepatitis C, Type II Diabetes, Date Acquired: 07/16/2021 History: Neuropathy Weeks Of Treatment: 0 Clustered Wound: Yes Photos Wound Measurements Length: (cm) 17 Width: (cm) 22 Depth: (cm) 0.2 Clustered Quantity: 15 Area: (cm) 293.739 Volume: (cm) 58.748 % Reduction in Area: 0% % Reduction in Volume: 0% Epithelialization: None Tunneling: No Undermining: No Wound Description Classification: Full Thickness With Exposed Support Structures Wound Margin: Distinct, outline attached Exudate Amount: Large Exudate Type: Purulent Exudate Color: yellow, brown, green Foul Odor After Cleansing: Yes Due to Product Use: No Slough/Fibrino Yes Wound Bed Granulation Amount: Small (1-33%) Exposed Structure Granulation Quality: Red, Pink Fascia Exposed: No Necrotic Amount: Large (67-100%) Fat Layer (Subcutaneous Tissue) Exposed: Yes Necrotic Quality: Eschar, Adherent Slough Tendon Exposed: No Muscle Exposed: No Joint Exposed: No Bone Exposed: No Treatment Notes Wound #2 (Foot) Wound Laterality: Right, Circumferential Cleanser Wound Cleanser Discharge Instruction: Cleanse the wound with wound cleanser prior to applying [name] clean dressing using gauze sponges, not tissue or cotton balls. Peri-Wound Care Topical Primary Dressing Dakin's Solution 0.25%, 16 (oz) Discharge Instruction: Moisten gauze with Dakin's solution Secondary Dressing Woven Gauze Sponge, Non-Sterile 4x4 in Discharge Instruction: Apply over primary dressing as directed. Zetuvit Plus Silicone Border Dressing 7x7(in/in) Discharge Instruction: Apply silicone border over primary dressing  as directed. Secured With [employer], 4.5x3.1 (in/yd) Discharge Instruction: Secure with Kerlix as directed. 31M Medipore H Soft Cloth Surgical T ape, 4 x 10 (in/yd) Discharge Instruction: Secure with tape as directed. Stretch Net Size 5, 10 (yds) Compression Wrap Compression Stockings Add-Ons Electronic Signature(s) Signed: 08/27/2021 4:34:49 PM By: Deon Pilling RN, BSN Signed: 08/28/2021 11:57:18 AM By: Rhae Hammock RN Entered By: Deon Pilling on 08/27/2021 09:05:53 -------------------------------------------------------------------------------- Vitals Details Patient Name: Date of Service: [name] [name] NKA FO [name], [name] [name] 08/27/2021 8:00 [name] [name] Medical Record Number: [id number] Patient Account Number: [id number] Date of Birth/Sex: Treating RN: 05-Sep-1945 (76 y.o. [name] [name], [name] [name] Primary Care Lizann Edelman: Karle Plumber Other Clinician: Referring Haddie Bruhl:  Treating Chanz Cahall/Extender: Sammuel Bailiff in Treatment: 0 Vital Signs Time Taken: 08:20 Temperature (F): 98.4 Height (in): 64 Pulse (bpm): 91 Source: Stated Respiratory Rate (breaths/min): 17 Weight (lbs): 150 Blood Pressure (mmHg): 132/77 Source: Stated Capillary Blood Glucose (mg/dl): 98 Body Mass Index (BMI): 25.7 Reference Range: 80 - 120 mg / dl Electronic Signature(s) Signed: 08/28/2021 11:57:18 AM By: Rhae Hammock RN Entered By: Rhae Hammock on 08/27/2021 08:25:27

## 2021-08-29 LAB — CBC WITH DIFFERENTIAL/PLATELET
Absolute Monocytes: 329 cells/uL (ref 200–950)
Basophils Absolute: 9 cells/uL (ref 0–200)
Basophils Relative: 0.2 %
Eosinophils Absolute: 99 cells/uL (ref 15–500)
Eosinophils Relative: 2.2 %
HCT: 41.6 % (ref 35.0–45.0)
Hemoglobin: 13.8 g/dL (ref 11.7–15.5)
Lymphs Abs: 1526 cells/uL (ref 850–3900)
MCH: 30.7 pg (ref 27.0–33.0)
MCHC: 33.2 g/dL (ref 32.0–36.0)
MCV: 92.4 fL (ref 80.0–100.0)
MPV: 9.8 fL (ref 7.5–12.5)
Monocytes Relative: 7.3 %
Neutro Abs: 2538 cells/uL (ref 1500–7800)
Neutrophils Relative %: 56.4 %
Platelets: 200 10*3/uL (ref 140–400)
RBC: 4.5 10*6/uL (ref 3.80–5.10)
RDW: 13.1 % (ref 11.0–15.0)
Total Lymphocyte: 33.9 %
WBC: 4.5 10*3/uL (ref 3.8–10.8)

## 2021-08-29 LAB — CRYOGLOBULIN: Cryoglobulin, Qualitative Analysis: NOT DETECTED

## 2021-08-31 ENCOUNTER — Telehealth: Payer: Self-pay | Admitting: Internal Medicine

## 2021-08-31 LAB — WOUND CULTURE

## 2021-08-31 NOTE — Telephone Encounter (Signed)
PC placed to pt and her daughter this evening to go over lab results.  They were informed that cryoglobulin was negative. 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 I explained to them that this is an inflammatory dermatitis thought to be immune mediated.  Treated with topical steroids or systemic steroids when extensive.  Can be seen in pts with HCV. We will continue oral steroid with plan for taper over several wks. Also informed them that wound cxs grew MRSA and Pseudomonas.  I asked Dr. Gale Journey to take a look at cx results and advise whether I should start treatment with abx.  He suggested we hold off for now and have pt see derm. Daughter tells me that derm appt at [facility] has been moved up for Thursday of this wk.  Advised that I will print copy of the bx report and other labs to take with her.  I will also print copy of my last note to take with her. Pt reports that her skin is looking much better but the skin on the soles are cracked.  Daughter states she has about 4-5 open wounds on the feet.  Told by derm to do dressing changes with Dakins solution to the wounds.  They only have a little left; derm was suppose to order some more for them.  I will send message to Kalman Shan, DO to inquire about getting more Dakins solution for them.

## 2021-09-01 ENCOUNTER — Encounter: Payer: Self-pay | Admitting: Internal Medicine

## 2021-09-02 ENCOUNTER — Other Ambulatory Visit: Payer: Self-pay | Admitting: Student

## 2021-09-02 ENCOUNTER — Other Ambulatory Visit: Payer: Self-pay

## 2021-09-02 ENCOUNTER — Encounter: Payer: Self-pay | Admitting: Hematology & Oncology

## 2021-09-02 ENCOUNTER — Telehealth: Payer: Self-pay | Admitting: Student

## 2021-09-02 DIAGNOSIS — R21 Rash and other nonspecific skin eruption: Secondary | ICD-10-CM

## 2021-09-02 MED ORDER — MUPIROCIN 2 % EX OINT
TOPICAL_OINTMENT | CUTANEOUS | 2 refills | Status: DC
  Filled 2021-09-02: qty 22, 10d supply, fill #0

## 2021-09-02 MED ORDER — FOSFOMYCIN TROMETHAMINE 3 G PO PACK
3.0000 g | PACK | Freq: Once | ORAL | 0 refills | Status: AC
  Filled 2021-09-02: qty 3, 1d supply, fill #0

## 2021-09-02 NOTE — Telephone Encounter (Signed)
Discussed with patient's daughter over the phone he states that new lesions have not popped up but the current ones have been foul-smelling and they have received instructions from wound care.  Steroids appear to have worked for stopping the outbreak.  I believe fosfomycin will be a good choice for treating both Pseudomonas and MRSA.  Advised them to pick up today/tomorrow.  They have an office appointment on 7/21 with infectious disease.

## 2021-09-02 NOTE — Assessment & Plan Note (Signed)
Culture revealed MRSA and pseudomonas. Given clinical appearance during visit, will treat with Fosfomycin 3g x1.

## 2021-09-03 ENCOUNTER — Ambulatory Visit: Payer: Self-pay | Admitting: Family

## 2021-09-03 ENCOUNTER — Encounter: Payer: Self-pay | Admitting: Hematology & Oncology

## 2021-09-03 ENCOUNTER — Other Ambulatory Visit: Payer: Self-pay

## 2021-09-03 ENCOUNTER — Ambulatory Visit: Payer: Self-pay | Admitting: Pharmacist

## 2021-09-03 MED ORDER — SILVER SULFADIAZINE 1 % EX CREA
TOPICAL_CREAM | CUTANEOUS | 1 refills | Status: DC
  Filled 2021-09-03: qty 400, 30d supply, fill #0

## 2021-09-03 MED ORDER — TRIAMCINOLONE ACETONIDE 0.1 % EX CREA
TOPICAL_CREAM | CUTANEOUS | 1 refills | Status: DC
  Filled 2021-09-03: qty 454, 30d supply, fill #0
  Filled 2021-10-01: qty 454, 30d supply, fill #1

## 2021-09-04 ENCOUNTER — Other Ambulatory Visit: Payer: Self-pay

## 2021-09-04 ENCOUNTER — Encounter: Payer: Self-pay | Admitting: Family

## 2021-09-04 ENCOUNTER — Ambulatory Visit (INDEPENDENT_AMBULATORY_CARE_PROVIDER_SITE_OTHER): Payer: Self-pay | Admitting: Family

## 2021-09-04 VITALS — BP 123/71 | HR 86 | Temp 97.4°F | Ht 64.0 in | Wt 151.0 lb

## 2021-09-04 DIAGNOSIS — C220 Liver cell carcinoma: Secondary | ICD-10-CM

## 2021-09-04 DIAGNOSIS — B182 Chronic viral hepatitis C: Secondary | ICD-10-CM

## 2021-09-04 DIAGNOSIS — L280 Lichen simplex chronicus: Secondary | ICD-10-CM

## 2021-09-04 NOTE — Assessment & Plan Note (Addendum)
[name] continues to take her Epclusa as prescribed with no other adverse side effects. Has completed 2 months thus far with current plan of treatment for 24 weeks per Dr. Gale Journey. Previous Hepatitis C RNA level undetectable. Plan for follow up in 1 month or sooner if needed.

## 2021-09-04 NOTE — Progress Notes (Signed)
Subjective:    Patient ID: [name] [name], female    DOB: Feb 08, 1946, 76 y.o.   MRN: [id number]  Chief Complaint  Patient presents with   Follow-up    HPI:  [name] [name] is a 76 y.o. female with chronic hepatitis C on Epclusa and complicated by hepatocellular carcinoma treated with nivolumab and development of rash/dermatitis last seen by Dr. Gale Journey  on 08/20/21 with good adherence and tolerance to Epclusa. Hepatitis C RNA level was undetectable. Seen by Dermatology on 7/20 for rash believed to be llichenoid dermatitis with recommendations to continue steroid treatment. Wound care had previously cultured right foot with cultures showing MRSA and Pseudomonas (R-ciprofloxacin). Here today for follow up appointment.   [name] [name] has seen continued improvement in her symptoms since starting on the steroid treatment. Denies any fevers/chills. Oncology is currently holding the nivolumab and she has completed 2 months of Epclusa to this point. Daughter has concerns once current steroids are stopped. Continues to receive wound care.    Allergies  Allergen Reactions   Amoxicillin Swelling and Rash    Daughter is unsure if patient is allergic to Amoxicillin or Clarithromycin.   Bactrim [Sulfamethoxazole-Trimethoprim] Swelling   Clarithromycin Swelling and Rash    Daughter is unsure if Clarithromycin or Amoxicillin caused rash and swelling.      Outpatient Medications Prior to Visit  Medication Sig Dispense Refill   brimonidine (ALPHAGAN) 0.2 % ophthalmic solution 1 drop 2 (two) times daily.     gabapentin (NEURONTIN) 300 MG capsule Take 1 capsule (300 mg total) by mouth once nightly at bedtime. 30 capsule 3   glucose blood (TRUE METRIX BLOOD GLUCOSE TEST) test strip Use as instructed 100 each 12   insulin isophane & regular human KwikPen (HUMULIN 70/30 KWIKPEN) (70-30) 100 UNIT/ML KwikPen INJECT 30-40 units subcutANEOUSLY EVERY MORNING and 5-12 units EVERY EVENING 15  mL 11   insulin lispro (HUMALOG) 100 UNIT/ML injection SSI with meals: 200-250 3 units, 251-300 6 units, 301-350 9 units, 351-400 12 units, 401-450 15 units, > 451 18 units under the skin as directed 10 mL 2   Insulin Pen Needle 31G X 6 MM MISC use as directed 100 each 6   losartan (COZAAR) 50 MG tablet Take 1 tablet (50 mg total) by mouth at bedtime. 30 tablet 6   NIFEdipine (PROCARDIA-XL/NIFEDICAL-XL) 30 MG 24 hr tablet Take 1 tablet (30 mg total) by mouth in the morning and at bedtime. MUST HAVE OFFICE VISIT FOR REFILLS 60 tablet 2   pantoprazole (PROTONIX) 40 MG tablet Take 1 tablet (40 mg total) by mouth 2 (two) times daily. (Patient taking differently: Take 40 mg by mouth daily.) 180 tablet 3   predniSONE (DELTASONE) 20 MG tablet Take 2 tablets (40 mg total) by mouth daily. (Patient taking differently: Take 30 mg by mouth daily.) 28 tablet 0   silver sulfADIAZINE (SILVADENE) 1 % cream Apply to the open, ulcerated areas of the skin once daily as needed. 400 g 1   sodium hypochlorite (DAKIN'S 1/4 STRENGTH) 0.125 % SOLN moisten gauze for wet to dry dressings 473 mL 3   Sofosbuvir-Velpatasvir (EPCLUSA) 400-100 MG TABS Take 1 tablet by mouth daily. 30 tablet 2   traMADol (ULTRAM) 50 MG tablet Take 1 tablet (50 mg total) by mouth every 8 (eight) hours as needed. 60 tablet 0   triamcinolone cream (KENALOG) 0.1 % Apply to the itchy and irritated areas of the skin of the back, forearms, hands, legs and feet daily as  needed 454 g 1   betamethasone dipropionate 0.05 % cream Apply topically 2 (two) times daily. (Patient not taking: Reported on 09/04/2021) 30 g 0   insulin NPH-regular Human (70-30) 100 UNIT/ML injection Inject 30-40 units into the skin once every morning and 5-12 units once every evening. (Patient not taking: Reported on 09/04/2021) 10 mL 0   linaclotide (LINZESS) 145 MCG CAPS capsule Take 1 capsule (145 mcg total) by mouth once daily before breakfast. (Office visit for further refills)  (Patient not taking: Reported on 09/04/2021) 30 capsule 2   mupirocin ointment (BACTROBAN) 2 % Apply once a day to biopsy portion of left foot (Patient not taking: Reported on 09/04/2021) 22 g 2   Na Sulfate-K Sulfate-Mg Sulf 17.5-3.13-1.6 GM/177ML SOLN Use as directed (Patient not taking: Reported on 09/04/2021) 354 mL 0   polyethylene glycol powder (GLYCOLAX/MIRALAX) 17 GM/SCOOP powder Take 17 g by mouth daily as needed. (Patient not taking: Reported on 09/04/2021)     terbinafine (LAMISIL) 250 MG tablet Take 1 tablet (250 mg total) by mouth once daily. (Patient not taking: Reported on 09/04/2021) 30 tablet 1   Facility-Administered Medications Prior to Visit  Medication Dose Route Frequency Provider Last Rate Last Admin   sodium chloride flush (NS) 0.9 % injection 10 mL  10 mL Intravenous PRN Celso Amy, NP   10 mL at 12/18/20 1009     Past Medical History:  Diagnosis Date   Colon cancer ([place])    Diabetes mellitus without complication ([place])    Glaucoma    Goals of care, counseling/discussion 09/01/2020   Hepatitis C    Hepatocellular carcinoma ([place])    Hypertension      Past Surgical History:  Procedure Laterality Date   COLON RESECTION  2005   In [place] - ?colon cancer   IR IMAGING GUIDED PORT INSERTION  09/18/2020   LAPAROSCOPIC GASTRIC RESECTION N/A 08/09/2020   Procedure: LAPAROSCOPIC EXPLORATION OF ABDOMEN, PERFORATED ULCER REPAIR, LIVER BIOPSY;  Surgeon: Michael Boston, MD;  Location: [facility];  Service: General;  Laterality: N/A;       Review of Systems  Constitutional:  Negative for chills, fatigue, fever and unexpected weight change.  Respiratory:  Negative for cough, chest tightness, shortness of breath and wheezing.   Cardiovascular:  Negative for chest pain and leg swelling.  Gastrointestinal:  Negative for abdominal distention, constipation, diarrhea, nausea and vomiting.  Skin:  Positive for rash.  Neurological:  Negative for dizziness, weakness,  light-headedness and headaches.  Hematological:  Does not bruise/bleed easily.      Objective:    BP 123/71   Pulse 86   Temp (!) 97.4 F (36.3 C) (Oral)   Ht '5\' 4"'$  (1.626 m)   Wt 151 lb (68.5 kg)   SpO2 99%   BMI 25.92 kg/m  Nursing note and vital signs reviewed.  Physical Exam Constitutional:      General: She is not in acute distress.    Appearance: She is well-developed.  Cardiovascular:     Rate and Rhythm: Normal rate and regular rhythm.     Heart sounds: Normal heart sounds. No murmur heard.    No friction rub. No [name].  Pulmonary:     Effort: Pulmonary effort is normal. No respiratory distress.     Breath sounds: Normal breath sounds. No wheezing or rales.  Chest:     Chest wall: No tenderness.  Abdominal:     General: Bowel sounds are normal. There is no distension.  Palpations: Abdomen is soft. There is no mass.     Tenderness: There is no abdominal tenderness. There is no guarding or rebound.  Musculoskeletal:     Comments: There is a wound on the dorsal lateral foot that appears with pink tissue. There is no purulent drainage that can be expressed with no induration or fluctuance present.   Skin:    General: Skin is warm and dry.  Neurological:     Mental Status: She is alert and oriented to person, place, and time.  Psychiatric:        Behavior: Behavior normal.        Thought Content: Thought content normal.        Judgment: Judgment normal.         Right foot    Right foot    Right foot   Left foot   Left foot      09/04/2021    9:03 AM 08/20/2021    2:54 PM 07/28/2021    3:53 PM 06/19/2021    2:55 PM 06/17/2021   11:39 AM  Depression screen PHQ 2/9  Decreased Interest 0 0 0 0 0  Down, Depressed, Hopeless 0 0 0 0 0  PHQ - 2 Score 0 0 0 0 0  Altered sleeping   0 0   Tired, decreased energy   0 0   Change in appetite   0 0   Feeling bad or failure about yourself    0 0   Trouble concentrating   0 0   Moving slowly or fidgety/restless    0 0   Suicidal thoughts   0 0   PHQ-9 Score   0 0        Assessment & Plan:    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   Hypercalcemia 09/22/2020   Chronic hepatitis C with hepatic coma ([place]) 09/22/2020   Goals of care, counseling/discussion 09/01/2020   Hepatocellular carcinoma ([place]) 08/14/2020   Hyperglycemia 08/10/2020   Pneumoperitoneum 08/09/2020   History of colorectal cancer 08/09/2020   Liver mass, left lobe 08/09/2020   Insulin-requiring or dependent type II diabetes mellitus ([place]) 08/09/2020   Hypertension associated with diabetes ([place]) 08/09/2020   Constipation, chronic 08/09/2020   Perforated gastric ulcer s/p lap omental Graham patch 08/09/2020 08/09/2020     Problem List Items Addressed This Visit       Digestive   Hepatocellular carcinoma ([place])    Previously treated with nivolumab which is currently being held. Suspect this may be the trigger of lichenoid dermatitis. Continue treatment at the direction of Oncology.       Chronic hepatitis C with hepatic coma ([place])    [name] [name] continues to take her Epclusa as prescribed with no other adverse side effects. Has completed 2 months thus far with current plan of treatment for 24 weeks per Dr. Gale Journey. Previous Hepatitis C RNA level undetectable. Plan for follow up in 1 month or sooner if needed.         Musculoskeletal and Integument   Lichenoid dermatitis    [name] [name] dermatitis appears to be improving with the start of corticosteroids. Question the origin of this dermatitis as [name] is not known to cause lichenoid dermatitis or a differential of necrolytic acral erythema. These are extrahepatic manifestations of Hepatitis C. Lichenoid dermatitis can be triggered by Checkpoint inhibitors like nivolumab. Interesting that the rash did not start until after Epclusa was started which  at this point would likely appear to be timing as opposed to drug reaction with  Epclusa. Agree with continued steroid use and further treatment recommendations from Dermatology. For the wound there does not appear to be any infection and would continue with wound care without any additional antibiotics. Unfortunately the Pseudomonas is resistant to ciprofloxacin so any treatment for Pseudomonas would have to be IV.         I am having [name] [name] maintain her brimonidine, True Metrix Blood Glucose Test, polyethylene glycol powder, NIFEdipine, Insulin Pen Needle, pantoprazole, HumuLIN 70/30 KwikPen, insulin NPH-regular Human, gabapentin, terbinafine, linaclotide, Na Sulfate-K Sulfate-Mg Sulf, Sofosbuvir-Velpatasvir, losartan, betamethasone dipropionate, predniSONE, traMADol, insulin lispro, sodium hypochlorite, mupirocin ointment, triamcinolone cream, and silver sulfADIAZINE.   Follow-up: Return in about 1 month (around 10/05/2021), or if symptoms worsen or fail to improve.   Terri Piedra, MSN, FNP-C Nurse Practitioner [facility] [place] number: [phone]

## 2021-09-04 NOTE — Patient Instructions (Addendum)
Nice to see you.  Continue to take your medication daily as prescribed.  I will speak with Dr. Gale Journey and Dr. Marin Olp regarding the next steps.  Continue with wound care. Would not treat with antibiotics at this time given the continued improvements.   Plan for follow up with Dr. Gale Journey in 1 months or sooner if needed with lab work on the same day.  Have a great day and stay safe!

## 2021-09-04 NOTE — Assessment & Plan Note (Signed)
Previously treated with nivolumab which is currently being held. Suspect this may be the trigger of lichenoid dermatitis. Continue treatment at the direction of Oncology.

## 2021-09-04 NOTE — Assessment & Plan Note (Addendum)
[name] dermatitis appears to be improving with the start of corticosteroids. Question the origin of this dermatitis as [name] is not known to cause lichenoid dermatitis or a differential of necrolytic acral erythema. These are extrahepatic manifestations of Hepatitis C. Lichenoid dermatitis can be triggered by Checkpoint inhibitors like nivolumab. Interesting that the rash did not start until after Epclusa was started which at this point would likely appear to be timing as opposed to drug reaction with Epclusa. Agree with continued steroid use and further treatment recommendations from Dermatology. For the wound there does not appear to be any infection and would continue with wound care without any additional antibiotics. Unfortunately the Pseudomonas is resistant to ciprofloxacin so any treatment for Pseudomonas would have to be IV.

## 2021-09-07 ENCOUNTER — Other Ambulatory Visit: Payer: Self-pay

## 2021-09-07 ENCOUNTER — Other Ambulatory Visit: Payer: Self-pay | Admitting: Internal Medicine

## 2021-09-07 ENCOUNTER — Ambulatory Visit: Payer: Self-pay | Attending: Internal Medicine | Admitting: Internal Medicine

## 2021-09-07 ENCOUNTER — Encounter: Payer: Self-pay | Admitting: Hematology & Oncology

## 2021-09-07 ENCOUNTER — Encounter: Payer: Self-pay | Admitting: Internal Medicine

## 2021-09-07 VITALS — BP 126/70 | HR 73 | Temp 98.2°F | Ht 64.0 in | Wt 150.0 lb

## 2021-09-07 DIAGNOSIS — L280 Lichen simplex chronicus: Secondary | ICD-10-CM

## 2021-09-07 DIAGNOSIS — E1142 Type 2 diabetes mellitus with diabetic polyneuropathy: Secondary | ICD-10-CM

## 2021-09-07 LAB — GLUCOSE, POCT (MANUAL RESULT ENTRY): POC Glucose: 340 mg/dl — AB (ref 70–99)

## 2021-09-07 MED ORDER — PREDNISONE 20 MG PO TABS
ORAL_TABLET | ORAL | 0 refills | Status: DC
  Filled 2021-09-07: qty 20, 14d supply, fill #0

## 2021-09-07 NOTE — Patient Instructions (Signed)
Continue prednisone 30 mg daily for the next 1 week.  After that we will drop it to 20 mg daily for 1 week.  Please call me and let me know before you run out of the prednisone as we will need to start doing a slower taper after you have reached the 20 mg dose.  Use the sliding scale Humalog insulin with meals.

## 2021-09-07 NOTE — Progress Notes (Signed)
Patient ID: [name] [name], female    DOB: 01/20/1946  MRN: [id number]  CC: Diabetes   Subjective: [name] [name] is a 76 y.o. female who presents for 2 wks f/u dermatitis.  Daughter is with her today. 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   Saw Derm at [facility] last Thursday.  I have reviewed the note.  Patient was started on Silvadene 1% cream to apply to open wounds and triamcinolone cream to itchy violaceous areas.  They agree with gradual prednisone taper.  Told to follow-up in 1 month.  Saw ID NP in f/u 09/04/2021. They plan to continue Epclusa for total of 6 mths.  She just completed 2 mths so far and is on day #3 of 3rd mth -Did  not recommend abx based on wound cx. However, derm recommended and sent in rxn for fosfomycin but pt's daughter has not pick up as yet.  Today: Still doing wound changes once a day. Has f/u with W[facility] Clinic later this wk Rash on extremities have dried up but a lot of peeling.  Open wounds are healing up.  Not as much drainage.  She continues to take the prednisone as prescribed.  I stepped her down from '40mg'$  to 30 mg on 09/03/2021  DM: Reports blood sugars are higher after meals since being on prednisone.  Checking BS 3x/day before meals.  Before BF range 93-120, before lunch 98-200s, before dinner 90s.  On average she is taking 7-15 units of Humalog with lunch.  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   Hypercalcemia 09/22/2020   Chronic hepatitis C with hepatic coma ([place]) 09/22/2020   Goals of care, counseling/discussion 09/01/2020   Hepatocellular carcinoma ([place]) 08/14/2020   Hyperglycemia 08/10/2020   Pneumoperitoneum 08/09/2020    History of colorectal cancer 08/09/2020   Liver mass, left lobe 08/09/2020   Insulin-requiring or dependent type II diabetes mellitus ([place]) 08/09/2020   Hypertension associated with diabetes ([place]) 08/09/2020   Constipation, chronic 08/09/2020   Perforated gastric ulcer s/p lap omental Graham patch 08/09/2020 08/09/2020     Current Outpatient Medications on File Prior to Visit  Medication Sig Dispense Refill   betamethasone dipropionate 0.05 % cream Apply topically 2 (two) times daily. 30 g 0   brimonidine (ALPHAGAN) 0.2 % ophthalmic solution 1 drop 2 (two) times daily.     gabapentin (NEURONTIN) 300 MG capsule Take 1 capsule (300 mg total) by mouth once nightly at bedtime. 30 capsule 3   glucose blood (TRUE METRIX BLOOD GLUCOSE TEST) test strip Use as instructed 100 each 12   insulin isophane & regular human KwikPen (HUMULIN 70/30 KWIKPEN) (70-30) 100 UNIT/ML KwikPen INJECT 30-40 units subcutANEOUSLY EVERY MORNING and 5-12 units EVERY EVENING 15 mL 11   insulin lispro (HUMALOG) 100 UNIT/ML injection SSI with meals: 200-250 3 units, 251-300 6 units, 301-350 9 units, 351-400 12 units, 401-450 15 units, > 451 18 units under the skin as directed 10 mL 2   insulin NPH-regular Human (70-30) 100 UNIT/ML injection Inject 30-40 units into the skin once every morning and 5-12 units once every evening. 10 mL 0   Insulin Pen Needle 31G X 6 MM MISC use as directed 100 each 6  linaclotide (LINZESS) 145 MCG CAPS capsule Take 1 capsule (145 mcg total) by mouth once daily before breakfast. (Office visit for further refills) 30 capsule 2   losartan (COZAAR) 50 MG tablet Take 1 tablet (50 mg total) by mouth at bedtime. 30 tablet 6   mupirocin ointment (BACTROBAN) 2 % Apply once a day to biopsy portion of left foot 22 g 2   Na Sulfate-K Sulfate-Mg Sulf 17.5-3.13-1.6 GM/177ML SOLN Use as directed 354 mL 0   NIFEdipine (PROCARDIA-XL/NIFEDICAL-XL) 30 MG 24 hr tablet Take 1 tablet (30 mg total) by mouth in the morning  and at bedtime. MUST HAVE OFFICE VISIT FOR REFILLS 60 tablet 2   pantoprazole (PROTONIX) 40 MG tablet Take 1 tablet (40 mg total) by mouth 2 (two) times daily. (Patient taking differently: Take 40 mg by mouth daily.) 180 tablet 3   polyethylene glycol powder (GLYCOLAX/MIRALAX) 17 GM/SCOOP powder Take 17 g by mouth daily as needed.     silver sulfADIAZINE (SILVADENE) 1 % cream Apply to the open, ulcerated areas of the skin once daily as needed. 400 g 1   sodium hypochlorite (DAKIN'S 1/4 STRENGTH) 0.125 % SOLN moisten gauze for wet to dry dressings 473 mL 3   Sofosbuvir-Velpatasvir (EPCLUSA) 400-100 MG TABS Take 1 tablet by mouth daily. 30 tablet 2   traMADol (ULTRAM) 50 MG tablet Take 1 tablet (50 mg total) by mouth every 8 (eight) hours as needed. 60 tablet 0   triamcinolone cream (KENALOG) 0.1 % Apply to the itchy and irritated areas of the skin of the back, forearms, hands, legs and feet daily as needed 454 g 1   fosfomycin (MONUROL) 3 g PACK Take 3 g by mouth once for 1 dose. 3 g 0   Current Facility-Administered Medications on File Prior to Visit  Medication Dose Route Frequency Provider Last Rate Last Admin   sodium chloride flush (NS) 0.9 % injection 10 mL  10 mL Intravenous PRN Celso Amy, NP   10 mL at 12/18/20 1009    Allergies  Allergen Reactions   Amoxicillin Swelling and Rash    Daughter is unsure if patient is allergic to Amoxicillin or Clarithromycin.   Bactrim [Sulfamethoxazole-Trimethoprim] Swelling   Clarithromycin Swelling and Rash    Daughter is unsure if Clarithromycin or Amoxicillin caused rash and swelling.    Social History   Socioeconomic History   Marital status: Widowed    Spouse name: Not on file   Number of children: 5   Years of education: 14   Highest education level: Associate degree: occupational, Hotel manager, or vocational program  Occupational History   Not on file  Tobacco Use   Smoking status: Never   Smokeless tobacco: Never  Vaping Use    Vaping Use: Never used  Substance and Sexual Activity   Alcohol use: Not Currently   Drug use: Never   Sexual activity: Not Currently  Other Topics Concern   Not on file  Social History Narrative   Originally from [place].  Speaks [place] and [place]   Social Determinants of Radio broadcast assistant Strain: Not on [employer] Insecurity: Not on file  Transportation Needs: Not on file  Physical Activity: Not on file  Stress: Not on file  Social Connections: Not on file  Intimate Partner Violence: Not on file    Family History  Problem Relation Age of Onset   Cancer Father        type unknown, was on the leg   Other  Daughter        Acoustic neuroma   Breast cancer Neg Hx    Colon cancer Neg Hx    Esophageal cancer Neg Hx    Rectal cancer Neg Hx    Stomach cancer Neg Hx     Past Surgical History:  Procedure Laterality Date   COLON RESECTION  2005   In [place] - ?colon cancer   IR IMAGING GUIDED PORT INSERTION  09/18/2020   LAPAROSCOPIC GASTRIC RESECTION N/A 08/09/2020   Procedure: LAPAROSCOPIC EXPLORATION OF ABDOMEN, PERFORATED ULCER REPAIR, LIVER BIOPSY;  Surgeon: Michael Boston, MD;  Location: [facility];  Service: General;  Laterality: N/A;    ROS: Review of Systems Negative except as stated above  PHYSICAL EXAM: BP 126/70   Pulse 73   Temp 98.2 F (36.8 C) (Oral)   Ht '5\' 4"'$  (1.626 m)   Wt 150 lb (68 kg)   SpO2 98%   BMI 25.75 kg/m   Wt Readings from Last 3 Encounters:  09/07/21 150 lb (68 kg)  09/04/21 151 lb (68.5 kg)  08/26/21 151 lb 12.8 oz (68.9 kg)    Physical Exam  General appearance - alert, well appearing, and in no distress Mental status - normal mood, behavior, speech, dress, motor activity, and thought processes Chest - clear to auscultation, no wheezes, rales or rhonchi, symmetric air entry Heart - normal rate, regular rhythm, normal S1, S2, no murmurs, rubs, clicks or gallops Skin -patient has hyperpigmented violaceous areas on both lower  extremities.  They have dried up.  She has some peeling of skin on the plantar and dorsal surface of the feet.  She has 1 open area on the lateral aspect of the right dorsal foot.  No drainage appreciated at this time.  No fluctuance appreciated.         Latest Ref Rng & Units 08/20/2021    3:41 PM 07/29/2021   10:01 AM 07/16/2021    7:42 AM  CMP  Glucose 65 - 99 mg/dL 58  112  171   BUN 7 - 25 mg/dL '16  20  15   '$ Creatinine 0.60 - 1.00 mg/dL 0.58  0.75  0.78   Sodium 135 - 146 mmol/L 139  136  139   Potassium 3.5 - 5.3 mmol/L 4.1  4.0  4.1   Chloride 98 - 110 mmol/L 106  103  107   CO2 20 - 32 mmol/L '21  27  26   '$ Calcium 8.6 - 10.4 mg/dL 10.4  10.4  10.4   Total Protein 6.1 - 8.1 g/dL 8.4  7.4  7.3   Total Bilirubin 0.2 - 1.2 mg/dL 0.6  0.7  0.5   Alkaline Phos 38 - 126 U/L  183  178   AST 10 - 35 U/L 43  30  29   ALT 6 - 29 U/L 39  37  34    Lipid Panel  No results found for: "CHOL", "TRIG", "HDL", "CHOLHDL", "VLDL", "LDLCALC", "LDLDIRECT"  CBC    Component Value Date/Time   WBC 4.5 08/24/2021 0858   RBC 4.50 08/24/2021 0858   HGB 13.8 08/24/2021 0858   HGB 12.6 07/29/2021 1001   HCT 41.6 08/24/2021 0858   PLT 200 08/24/2021 0858   PLT 182 07/29/2021 1001   MCV 92.4 08/24/2021 0858   MCH 30.7 08/24/2021 0858   MCHC 33.2 08/24/2021 0858   RDW 13.1 08/24/2021 0858   LYMPHSABS 1,526 08/24/2021 0858   MONOABS 0.3 07/29/2021 1001  EOSABS 99 08/24/2021 0858   BASOSABS 9 08/24/2021 0858    ASSESSMENT AND PLAN: 1. Lichenoid dermatitis Still present but much improved in appearance compared to 2 weeks ago. I appreciate input from ID and dermatology. Continue prednisone 30 mg for an additional week then we will step down to 20 mg.  Very slow taper after that.  If any worsening with a stepdown dose, we will return to the previous dose.  Continue dressing changes and creams as recommended by dermatology. -Advised patient to hold off on antibiotics as recommended by ID -  predniSONE (DELTASONE) 20 MG tablet; Take 1.5 tablets by mouth daily x 1 week , then 1 tablet daily x 1 week.  Dispense: 20 tablet; Refill: 0  2. Diabetic polyneuropathy associated with type 2 diabetes mellitus ([place]) Advised patient to use the Humalog sliding scale with meals as prescribed.  Continue Humulin 70/30 30 units in the morning and 5 to 7 units in the evenings. - POCT glucose (manual entry)     Patient was given the opportunity to ask questions.  Patient verbalized understanding of the plan and was able to repeat key elements of the plan.   This documentation was completed using Radio producer.  Any transcriptional errors are unintentional.  Orders Placed This Encounter  Procedures   POCT glucose (manual entry)     Requested Prescriptions   Signed Prescriptions Disp Refills   predniSONE (DELTASONE) 20 MG tablet 20 tablet 0    Sig: Take 1.5 tablets by mouth daily x 1 week , then 1 tablet daily x 1 week.    Return in about 4 years (around 09/07/2025).  Karle Plumber, MD, FACP

## 2021-09-07 NOTE — Progress Notes (Signed)
CBG- 340

## 2021-09-08 ENCOUNTER — Encounter: Payer: Self-pay | Admitting: Hematology & Oncology

## 2021-09-08 ENCOUNTER — Other Ambulatory Visit: Payer: Self-pay

## 2021-09-08 MED ORDER — NIFEDIPINE ER OSMOTIC RELEASE 30 MG PO TB24
30.0000 mg | ORAL_TABLET | Freq: Two times a day (BID) | ORAL | 0 refills | Status: DC
  Filled 2021-09-08 – 2021-09-17 (×2): qty 60, 30d supply, fill #0
  Filled 2021-10-14: qty 60, 30d supply, fill #1
  Filled 2021-11-10: qty 60, 30d supply, fill #2

## 2021-09-08 NOTE — Telephone Encounter (Signed)
Requested Prescriptions  Pending Prescriptions Disp Refills  . NIFEdipine (PROCARDIA-XL/NIFEDICAL-XL) 30 MG 24 hr tablet 180 tablet 0    Sig: Take 1 tablet (30 mg total) by mouth in the morning and at bedtime. MUST HAVE OFFICE VISIT FOR REFILLS     Cardiovascular: Calcium Channel Blockers 2 Passed - 09/07/2021  7:24 AM      Passed - Last BP in normal range    BP Readings from Last 1 Encounters:  09/07/21 126/70         Passed - Last Heart Rate in normal range    Pulse Readings from Last 1 Encounters:  09/07/21 73         Passed - Valid encounter within last 6 months    Recent Outpatient Visits          Yesterday [place], Deborah B, MD   2 weeks ago [place], MD   1 month ago Diabetic polyneuropathy associated with type 2 diabetes mellitus [facility])   [place], Charlane Ferretti, MD   2 months ago Type 2 diabetes mellitus with peripheral neuropathy [facility])   [place], MD   7 months ago Need for zoster vaccination   [place], RPH-CPP      Future Appointments            In 1 month Vu, Rockey Situ, MD [facility]   In 3 months Ladell Pier, MD [place]

## 2021-09-10 ENCOUNTER — Inpatient Hospital Stay: Payer: Self-pay | Attending: Hematology & Oncology

## 2021-09-10 ENCOUNTER — Inpatient Hospital Stay (HOSPITAL_BASED_OUTPATIENT_CLINIC_OR_DEPARTMENT_OTHER): Payer: Self-pay | Admitting: Hematology & Oncology

## 2021-09-10 ENCOUNTER — Encounter: Payer: Self-pay | Admitting: Hematology & Oncology

## 2021-09-10 ENCOUNTER — Inpatient Hospital Stay: Payer: Self-pay

## 2021-09-10 ENCOUNTER — Other Ambulatory Visit: Payer: Self-pay

## 2021-09-10 VITALS — BP 133/60 | HR 81 | Temp 98.1°F | Resp 18 | Ht 64.0 in | Wt 150.0 lb

## 2021-09-10 DIAGNOSIS — R21 Rash and other nonspecific skin eruption: Secondary | ICD-10-CM | POA: Insufficient documentation

## 2021-09-10 DIAGNOSIS — Z95828 Presence of other vascular implants and grafts: Secondary | ICD-10-CM

## 2021-09-10 DIAGNOSIS — B182 Chronic viral hepatitis C: Secondary | ICD-10-CM

## 2021-09-10 DIAGNOSIS — C220 Liver cell carcinoma: Secondary | ICD-10-CM

## 2021-09-10 DIAGNOSIS — E032 Hypothyroidism due to medicaments and other exogenous substances: Secondary | ICD-10-CM

## 2021-09-10 DIAGNOSIS — B1920 Unspecified viral hepatitis C without hepatic coma: Secondary | ICD-10-CM | POA: Insufficient documentation

## 2021-09-10 LAB — CBC WITH DIFFERENTIAL (CANCER CENTER ONLY)
Abs Immature Granulocytes: 0.02 10*3/uL (ref 0.00–0.07)
Basophils Absolute: 0 10*3/uL (ref 0.0–0.1)
Basophils Relative: 0 %
Eosinophils Absolute: 0 10*3/uL (ref 0.0–0.5)
Eosinophils Relative: 0 %
HCT: 39.7 % (ref 36.0–46.0)
Hemoglobin: 13.3 g/dL (ref 12.0–15.0)
Immature Granulocytes: 0 %
Lymphocytes Relative: 43 %
Lymphs Abs: 3.6 10*3/uL (ref 0.7–4.0)
MCH: 30.4 pg (ref 26.0–34.0)
MCHC: 33.5 g/dL (ref 30.0–36.0)
MCV: 90.8 fL (ref 80.0–100.0)
Monocytes Absolute: 0.5 10*3/uL (ref 0.1–1.0)
Monocytes Relative: 7 %
Neutro Abs: 4.2 10*3/uL (ref 1.7–7.7)
Neutrophils Relative %: 50 %
Platelet Count: 193 10*3/uL (ref 150–400)
RBC: 4.37 MIL/uL (ref 3.87–5.11)
RDW: 14.1 % (ref 11.5–15.5)
WBC Count: 8.3 10*3/uL (ref 4.0–10.5)
nRBC: 0 % (ref 0.0–0.2)

## 2021-09-10 LAB — CMP (CANCER CENTER ONLY)
ALT: 40 U/L (ref 0–44)
AST: 19 U/L (ref 15–41)
Albumin: 3.9 g/dL (ref 3.5–5.0)
Alkaline Phosphatase: 144 U/L — ABNORMAL HIGH (ref 38–126)
Anion gap: 7 (ref 5–15)
BUN: 14 mg/dL (ref 8–23)
CO2: 27 mmol/L (ref 22–32)
Calcium: 10.5 mg/dL — ABNORMAL HIGH (ref 8.9–10.3)
Chloride: 102 mmol/L (ref 98–111)
Creatinine: 0.72 mg/dL (ref 0.44–1.00)
GFR, Estimated: >60 mL/min (ref >60)
Glucose, Bld: 142 mg/dL — ABNORMAL HIGH (ref 70–99)
Potassium: 3.6 mmol/L (ref 3.5–5.1)
Sodium: 136 mmol/L (ref 135–145)
Total Bilirubin: 0.6 mg/dL (ref 0.3–1.2)
Total Protein: 7 g/dL (ref 6.5–8.1)

## 2021-09-10 LAB — TSH: TSH: 5.557 u[IU]/mL — ABNORMAL HIGH (ref 0.350–4.500)

## 2021-09-10 LAB — LACTATE DEHYDROGENASE: LDH: 150 U/L (ref 98–192)

## 2021-09-10 MED ORDER — HEPARIN SOD (PORK) LOCK FLUSH 100 UNIT/ML IV SOLN
500.0000 [IU] | Freq: Once | INTRAVENOUS | Status: AC
  Administered 2021-09-10: 500 [IU] via INTRAVENOUS

## 2021-09-10 MED ORDER — SODIUM CHLORIDE 0.9% FLUSH
10.0000 mL | Freq: Once | INTRAVENOUS | Status: AC
  Administered 2021-09-10: 10 mL via INTRAVENOUS

## 2021-09-10 NOTE — Patient Instructions (Signed)

## 2021-09-10 NOTE — Addendum Note (Signed)
Addended by: Amelia Jo I on: 09/10/2021 10:25 AM   Modules accepted: Orders

## 2021-09-10 NOTE — Progress Notes (Signed)
Hematology and Oncology Follow Up Visit  [name] [name] [id number] 15-Jul-1945 76 y.o. 09/10/2021   Principle Diagnosis:  Hepatocellular carcinoma-multifocal Hepatitis C   Current Therapy:        Status post cycle 1 of nivolumab/ipilimumab -- d/c on 10/13/2020 due to hepatic toxicity Nivolumab 480 mg IV every 4 weeks - s/p cycle #17 -- started 11/21/2020 --changed to 480 mg on 08/2021   Interim History:  [name] is here today for follow-up and treatment.  Unfortunately, she really has had a tough time since we last saw her.  She started treatment for the Hepatitis C.  A couple days after that, she began to develop a horrendous rash on her lower legs and feet.  She had open skin wounds.  She had biopsies done.  The biopsies showed lichenoid dermatitis.  It was unclear as to what triggered this.  The ID physician did not think it was from the treatment for her hepatitis C even though she is started this a few days before hand.  I am unsure if this would be from the nivolumab.  She has been on nivolumab for over a year.  She never had a problem with the nivolumab.  However, with immunotherapy, it is certainly possible to develop a reaction this far out.  She did see a dermatologist.  He was not sure what triggered this.  However, she was getting better as she was put on some steroids.  She currently is on prednisone at 30 mg a day.  Thankfully, she has responded to the nivolumab.  Her last alpha-fetoprotein was down to 9.4.  Likely, I think we still have a little bit of flexibility with how we can treat her with the nivolumab.  I think we need to get the prednisone dose down a little bit more before we do any treatment.  She feels well otherwise.  She has had no problems with nausea or vomiting.  Her blood sugars have been on the higher side because of the prednisone.  There is been no change in bowel or bladder habits.  She has had no fever.  She had no bleeding,  as site of that associated with a rash on her lower legs.  Currently, I would say performance status is probably ECOG 1.     Medications:  Allergies as of 09/10/2021       Reactions   Amoxicillin Swelling, Rash   Daughter is unsure if patient is allergic to Amoxicillin or Clarithromycin.   Bactrim [sulfamethoxazole-trimethoprim] Swelling   Clarithromycin Swelling, Rash   Daughter is unsure if Clarithromycin or Amoxicillin caused rash and swelling.        Medication List        Accurate as of September 10, 2021  9:31 AM. If you have any questions, ask your nurse or doctor.          betamethasone dipropionate 0.05 % cream Apply topically 2 (two) times daily.   brimonidine 0.2 % ophthalmic solution Commonly known as: ALPHAGAN 1 drop 2 (two) times daily.   gabapentin 300 MG capsule Commonly known as: NEURONTIN Take 1 capsule (300 mg total) by mouth once nightly at bedtime.   HumaLOG 100 UNIT/ML injection Generic drug: insulin lispro SSI with meals: 200-250 3 units, 251-300 6 units, 301-350 9 units, 351-400 12 units, 401-450 15 units, > 451 18 units under the skin as directed   HumuLIN 70/30 KwikPen (70-30) 100 UNIT/ML KwikPen Generic drug: insulin isophane & regular human KwikPen INJECT  30-40 units subcutANEOUSLY EVERY MORNING and 5-12 units EVERY EVENING   HumuLIN 70/30 (70-30) 100 UNIT/ML injection Generic drug: insulin NPH-regular Human Inject 30-40 units into the skin once every morning and 5-12 units once every evening.   linaclotide 145 MCG Caps capsule Commonly known as: Linzess Take 1 capsule (145 mcg total) by mouth once daily before breakfast. (Office visit for further refills)   losartan 50 MG tablet Commonly known as: COZAAR Take 1 tablet (50 mg total) by mouth at bedtime.   mupirocin ointment 2 % Commonly known as: BACTROBAN Apply once a day to biopsy portion of left foot   NIFEdipine 30 MG 24 hr tablet Commonly known as:  PROCARDIA-XL/NIFEDICAL-XL Take 1 tablet (30 mg total) by mouth in the morning and at bedtime. MUST HAVE OFFICE VISIT FOR REFILLS   pantoprazole 40 MG tablet Commonly known as: Protonix Take 1 tablet (40 mg total) by mouth 2 (two) times daily. What changed: when to take this   polyethylene glycol powder 17 GM/SCOOP powder Commonly known as: GLYCOLAX/MIRALAX Take 17 g by mouth daily as needed.   predniSONE 20 MG tablet Commonly known as: DELTASONE Take 1.5 tablets by mouth daily x 1 week , then 1 tablet daily x 1 week.   sodium hypochlorite 0.125 % Soln Commonly known as: DAKIN'S 1/4 STRENGTH moisten gauze for wet to dry dressings   Sofosbuvir-Velpatasvir 400-100 MG Tabs Commonly known as: Epclusa Take 1 tablet by mouth daily.   SSD 1 % cream Generic drug: silver sulfADIAZINE Apply to the open, ulcerated areas of the skin once daily as needed.   Suprep Bowel Prep Kit 17.5-3.13-1.6 GM/177ML Soln Generic drug: Na Sulfate-K Sulfate-Mg Sulf Use as directed   traMADol 50 MG tablet Commonly known as: ULTRAM Take 1 tablet (50 mg total) by mouth every 8 (eight) hours as needed.   triamcinolone cream 0.1 % Commonly known as: KENALOG Apply to the itchy and irritated areas of the skin of the back, forearms, hands, legs and feet daily as needed   True Metrix Blood Glucose Test test strip Generic drug: glucose blood Use as instructed   TRUEplus 5-Bevel Pen Needles 31G X 6 MM Misc Generic drug: Insulin Pen Needle use as directed        Allergies:  Allergies  Allergen Reactions   Amoxicillin Swelling and Rash    Daughter is unsure if patient is allergic to Amoxicillin or Clarithromycin.   Bactrim [Sulfamethoxazole-Trimethoprim] Swelling   Clarithromycin Swelling and Rash    Daughter is unsure if Clarithromycin or Amoxicillin caused rash and swelling.    Past Medical History, Surgical history, Social history, and Family History were reviewed and updated.  Review of  Systems: Review of Systems  Constitutional: Negative.   HENT: Negative.    Eyes: Negative.   Respiratory: Negative.    Cardiovascular: Negative.   Gastrointestinal: Negative.   Genitourinary: Negative.   Musculoskeletal: Negative.   Skin: Negative.   Neurological:  Positive for tingling.  Endo/Heme/Allergies: Negative.   Psychiatric/Behavioral: Negative.       Physical Exam:  height is _0  (1.626 m) and weight is 150 lb (68 kg). Her oral temperature is 98.1 F (36.7 C). Her blood pressure is 133/60 and her pulse is 81. Her respiration is 18 and oxygen saturation is 98%.   Wt Readings from Last 3 Encounters:  09/10/21 150 lb (68 kg)  09/07/21 150 lb (68 kg)  09/04/21 151 lb (68.5 kg)    Physical Exam Vitals reviewed.  HENT:  Head: Normocephalic and atraumatic.  Eyes:     Pupils: Pupils are equal, round, and reactive to light.  Cardiovascular:     Rate and Rhythm: Normal rate and regular rhythm.     Heart sounds: Normal heart sounds.  Pulmonary:     Effort: Pulmonary effort is normal.     Breath sounds: Normal breath sounds.  Abdominal:     General: Bowel sounds are normal.     Palpations: Abdomen is soft.     Comments: Abdominal exam shows a well-healed laparotomy scar.  She has no fluid wave.  There is no guarding or rebound tenderness to palpation.  There is no palpable liver or spleen tip.  Musculoskeletal:        General: No tenderness or deformity. Normal range of motion.     Cervical back: Normal range of motion.  Lymphadenopathy:     Cervical: No cervical adenopathy.  Skin:    General: Skin is warm and dry.     Findings: No erythema or rash.     Comments: Skin exam does show a extensive but somewhat healing papular rash on her lower legs.  This is below the knees.  She has some dressing on the foot on the right foot.  She has no open wounds.  There is no blisters.  Neurological:     Mental Status: She is alert and oriented to person, place, and time.   Psychiatric:        Behavior: Behavior normal.        Thought Content: Thought content normal.        Judgment: Judgment normal.       Lab Results  Component Value Date   WBC 8.3 09/10/2021   HGB 13.3 09/10/2021   HCT 39.7 09/10/2021   MCV 90.8 09/10/2021   PLT 193 09/10/2021   Lab Results  Component Value Date   FERRITIN 603 (H) 10/02/2020   IRON 31 (L) 10/02/2020   TIBC 241 10/02/2020   UIBC 211 10/02/2020   IRONPCTSAT 13 (L) 10/02/2020   Lab Results  Component Value Date   RBC 4.37 09/10/2021   No results found for: "KPAFRELGTCHN", "LAMBDASER", "KAPLAMBRATIO" No results found for: "IGGSERUM", "IGA", "IGMSERUM" No results found for: "TOTALPROTELP", "ALBUMINELP", "A1GS", "A2GS", "BETS", "BETA2SER", "GAMS", "MSPIKE", "SPEI"   Chemistry      Component Value Date/Time   NA 136 09/10/2021 0830   K 3.6 09/10/2021 0830   CL 102 09/10/2021 0830   CO2 27 09/10/2021 0830   BUN 14 09/10/2021 0830   CREATININE 0.72 09/10/2021 0830   CREATININE 0.58 (L) 08/20/2021 1541      Component Value Date/Time   CALCIUM 10.5 (H) 09/10/2021 0830   ALKPHOS 144 (H) 09/10/2021 0830   AST 19 09/10/2021 0830   ALT 40 09/10/2021 0830   BILITOT 0.6 09/10/2021 0830       Impression and Plan: [name] is a very pleasant 76 yo female from [place] with hepatocellular carcinoma and Hepatitis C.   Unfortunately, I just do not think we can treat her today because of this rash and because of the prednisone.  I told her to decrease her prednisone dose down to 20 mg a day.  I think this would be reasonable.  Hopefully, in 2 weeks, we can go ahead and treat her.  I want to try to her with the 480 mg monthly dose.  This will be a lot easier for her.  I just hate that she developed this  rash.  I still had to believe that this may have been from the treatment for Hepatitis C.  Again I do not know if we can prove this.  We will see her back in 2 weeks.     Volanda Napoleon,  MD 7/27/20239:31 AM

## 2021-09-11 ENCOUNTER — Encounter (HOSPITAL_BASED_OUTPATIENT_CLINIC_OR_DEPARTMENT_OTHER): Payer: Self-pay | Admitting: Internal Medicine

## 2021-09-11 DIAGNOSIS — L97522 Non-pressure chronic ulcer of other part of left foot with fat layer exposed: Secondary | ICD-10-CM

## 2021-09-11 LAB — AFP TUMOR MARKER: AFP, Serum, Tumor Marker: 6.2 ng/mL (ref 0.0–9.2)

## 2021-09-14 ENCOUNTER — Telehealth: Payer: Self-pay

## 2021-09-14 NOTE — Telephone Encounter (Signed)
[facility] Patient Advocate Encounter  Patient's medications have been couriered to [facility] from [place] and will be picked up 10/14/21.  [place]  Ileene Patrick , [place] Patient [facility] Phone: [phone] Fax:  [phone]

## 2021-09-15 ENCOUNTER — Other Ambulatory Visit: Payer: Self-pay

## 2021-09-17 ENCOUNTER — Ambulatory Visit: Payer: Self-pay | Admitting: Family

## 2021-09-17 ENCOUNTER — Other Ambulatory Visit: Payer: Self-pay

## 2021-09-17 ENCOUNTER — Encounter: Payer: Self-pay | Admitting: Hematology & Oncology

## 2021-09-17 NOTE — Progress Notes (Signed)
KENNETTA, PAVLOVIC ([id number]) Visit Report for 09/11/2021 Arrival Information Details Patient Name: Date of Service: A SEGHA NKA Mickel Fuchs, [place] RIA NA 09/11/2021 10:00 A M Medical Record Number: [id number] Patient Account Number: [id number] Date of Birth/Sex: Treating RN: November 02, 1945 (76 y.o. [name], [name] Primary Care [place] Schnitker: Karle Plumber Other Clinician: Referring Leyton Magoon: Treating Garvis Downum/Extender: Sammuel Bailiff in Treatment: 2 Visit Information History Since Last Visit Added or deleted any medications: Yes Patient Arrived: Ambulatory Any new allergies or adverse reactions: No Arrival Time: 10:15 Had a fall or experienced change in No Accompanied By: daughter activities of daily living that may affect Transfer Assistance: None risk of falls: Patient Identification Verified: Yes Signs or symptoms of abuse/neglect since last visito No Secondary Verification Process Completed: Yes Hospitalized since last visit: No Patient Requires Transmission-Based Precautions: No Implantable device outside of the clinic excluding No Patient Has Alerts: No cellular tissue based products placed in the center since last visit: Has Dressing in Place as Prescribed: Yes Pain Present Now: No Electronic Signature(s) Signed: 09/11/2021 12:50:54 PM By: Erenest Blank Entered By: Erenest Blank on 09/11/2021 10:16:58 -------------------------------------------------------------------------------- Encounter Discharge Information Details Patient Name: Date of Service: [name], [name] 09/11/2021 10:00 A M Medical Record Number: [id number] Patient Account Number: [id number] Date of Birth/Sex: Treating RN: 1945-02-23 (76 y.o. Donalda Ewings Primary Care Antonie Borjon: Karle Plumber Other Clinician: Referring Fumie Fiallo: Treating Roth Ress/Extender: Sammuel Bailiff in Treatment: 2 Encounter Discharge Information Items  Post Procedure Vitals Discharge Condition: Stable Temperature (F): 98.0 Ambulatory Status: Ambulatory Pulse (bpm): 80 Discharge Destination: Home Respiratory Rate (breaths/min): 18 Transportation: Private Auto Blood Pressure (mmHg): 121/70 Accompanied By: daughter Schedule Follow-up Appointment: Yes Clinical Summary of Care: Patient Declined Electronic Signature(s) Signed: 09/11/2021 12:58:23 PM By: Sharyn Creamer RN, BSN Entered By: Sharyn Creamer on 09/11/2021 10:55:31 -------------------------------------------------------------------------------- Lower Extremity Assessment Details Patient Name: Date of Service: [name], [name] 09/11/2021 10:00 A M Medical Record Number: [id number] Patient Account Number: [id number] Date of Birth/Sex: Treating RN: Feb 05, 1946 (76 y.o. [name], [name] Primary Care Drenda Sobecki: Karle Plumber Other Clinician: Referring Farley Crooker: Treating Nickolus Wadding/Extender: Sammuel Bailiff in Treatment: 2 Edema Assessment Assessed: Shirlyn Goltz: No] [Right: No] Edema: [Left: Yes] [Right: Yes] Calf Left: Right: Point of Measurement: 34 cm From Medial Instep 33.5 cm 32.6 cm Ankle Left: Right: Point of Measurement: 7 cm From Medial Instep 22 cm 19.5 cm Electronic Signature(s) Signed: 09/11/2021 12:50:54 PM By: Erenest Blank Signed: 09/17/2021 3:56:15 PM By: Rhae Hammock RN Entered By: Erenest Blank on 09/11/2021 10:22:49 -------------------------------------------------------------------------------- Multi Wound Chart Details Patient Name: Date of Service: [name], [name] 09/11/2021 10:00 A M Medical Record Number: [id number] Patient Account Number: [id number] Date of Birth/Sex: Treating RN: 07/04/1945 (76 y.o. [name], [name] Primary Care Trampus Mcquerry: Karle Plumber Other Clinician: Referring Kareen Hitsman: Treating Matraca Hunkins/Extender: Sammuel Bailiff in Treatment: 2 Vital  Signs Height(in): 64 Capillary Blood Glucose(mg/dl): 97 Weight(lbs): 150 Pulse(bpm): 64 Body Mass Index(BMI): 25.7 Blood Pressure(mmHg): 121/70 Temperature(F): 98 Respiratory Rate(breaths/min): 18 Photos: [N/A:N/A] Left, Circumferential Foot Right, Circumferential Foot N/A Wound Location: Blister Blister N/A Wounding Event: T be determined o T be determined o N/A Primary Etiology: Glaucoma, Hypertension, Hepatitis C, Glaucoma, Hypertension, Hepatitis C, N/A Comorbid History: Type II Diabetes, Neuropathy Type II Diabetes, Neuropathy 07/16/2021 07/16/2021 N/A Date Acquired: 2 2 N/A Weeks of Treatment: Open Open N/A Wound Status: No No N/A Wound Recurrence: Yes Yes N/A Clustered  Wound: 15 15 N/A Clustered Quantity: 1x0.3x0.1 1.9x1x0.1 N/A Measurements L x W x D (cm) 0.236 1.492 N/A A (cm) : rea 0.024 0.149 N/A Volume (cm) : 99.90% 99.50% N/A % Reduction in Area: 100.00% 99.70% N/A % Reduction in Volume: Full Thickness With Exposed Support Full Thickness With Exposed Support N/A Classification: Structures Structures Large Large N/A Exudate A mount: Purulent Purulent N/A Exudate Type: yellow, brown, green yellow, brown, green N/A Exudate Color: Yes Yes N/A Foul Odor A Cleansing: fter No No N/A Odor Anticipated Due to Product Use: Distinct, outline attached Distinct, outline attached N/A Wound Margin: Small (1-33%) Small (1-33%) N/A Granulation A mount: Red, Pink Red, Pink N/A Granulation Quality: Large (67-100%) Large (67-100%) N/A Necrotic Amount: Adherent Slough Eschar, Adherent Slough N/A Necrotic Tissue: Fat Layer (Subcutaneous Tissue): Yes Fat Layer (Subcutaneous Tissue): Yes N/A Exposed Structures: Fascia: No Fascia: No Tendon: No Tendon: No Muscle: No Muscle: No Joint: No Joint: No Bone: No Bone: No Large (67-100%) None N/A Epithelialization: Debridement - Excisional N/A N/A Debridement: Pre-procedure Verification/Time Out 10:40  N/A N/A Taken: Lidocaine 4% Topical Solution N/A N/A Pain Control: Subcutaneous, Slough N/A N/A Tissue Debrided: Skin/Subcutaneous Tissue N/A N/A Level: 0.3 N/A N/A Debridement A (sq cm): rea Curette N/A N/A Instrument: Minimum N/A N/A Bleeding: Pressure N/A N/A Hemostasis A chieved: 0 N/A N/A Procedural Pain: 0 N/A N/A Post Procedural Pain: Procedure was tolerated well N/A N/A Debridement Treatment Response: 1x0.3x0.1 N/A N/A Post Debridement Measurements L x W x D (cm) 0.024 N/A N/A Post Debridement Volume: (cm) Debridement N/A N/A Procedures Performed: Treatment Notes Wound #1 (Foot) Wound Laterality: Left, Circumferential Cleanser Soap and Water Discharge Instruction: May shower and wash wound with dial antibacterial soap and water prior to dressing change. Peri-Wound Care Skin Prep Discharge Instruction: Use skin prep as directed Topical Primary Dressing MediHoney Gel, tube 1.5 (oz) Discharge Instruction: Apply to wound bed as instructed Secondary Dressing Woven Gauze Sponge, Non-Sterile 4x4 in Discharge Instruction: Apply over primary dressing as directed. Zetuvit Plus Silicone Border Dressing 7x7(in/in) Discharge Instruction: Apply silicone border over primary dressing as directed. Secured With [employer], 4.5x3.1 (in/yd) Discharge Instruction: Secure with Kerlix as directed. 52M Medipore H Soft Cloth Surgical T ape, 4 x 10 (in/yd) Discharge Instruction: Secure with tape as directed. Stretch Net Size 5, 10 (yds) Compression Wrap Compression Stockings Add-Ons Wound #2 (Foot) Wound Laterality: Right, Circumferential Cleanser Soap and Water Discharge Instruction: May shower and wash wound with dial antibacterial soap and water prior to dressing change. Peri-Wound Care Skin Prep Discharge Instruction: Use skin prep as directed Topical Primary Dressing MediHoney Gel, tube 1.5 (oz) Discharge Instruction: Apply to wound bed as  instructed Secondary Dressing Woven Gauze Sponge, Non-Sterile 4x4 in Discharge Instruction: Apply over primary dressing as directed. Zetuvit Plus Silicone Border Dressing 7x7(in/in) Discharge Instruction: Apply silicone border over primary dressing as directed. Secured With [employer], 4.5x3.1 (in/yd) Discharge Instruction: Secure with Kerlix as directed. 52M Medipore H Soft Cloth Surgical T ape, 4 x 10 (in/yd) Discharge Instruction: Secure with tape as directed. Stretch Net Size 5, 10 (yds) Compression Wrap Compression Stockings Add-Ons Electronic Signature(s) Signed: 09/11/2021 12:55:11 PM By: Kalman Shan DO Signed: 09/17/2021 3:56:15 PM By: Rhae Hammock RN Entered By: Kalman Shan on 09/11/2021 11:58:25 -------------------------------------------------------------------------------- Multi-Disciplinary Care Plan Details Patient Name: Date of Service: [name], [name] 09/11/2021 10:00 A M Medical Record Number: [id number] Patient Account Number: [id number] Date of Birth/Sex: Treating RN: Jun 27, 1945 (76 y.o. Ardelle Balls,  Morey Hummingbird Primary Care Mica Ramdass: Karle Plumber Other Clinician: Referring Kyuss Hale: Treating Corrinna Karapetyan/Extender: Sammuel Bailiff in Treatment: 2 Active Inactive Orientation to the Wound Care Program Nursing Diagnoses: Knowledge deficit related to the wound healing center program Goals: Patient/caregiver will verbalize understanding of the [place] Date Initiated: 08/27/2021 Target Resolution Date: 09/18/2021 Goal Status: Active Interventions: Provide education on orientation to the wound center Notes: Wound/Skin Impairment Nursing Diagnoses: Impaired tissue integrity Knowledge deficit related to ulceration/compromised skin integrity Goals: Patient will have a decrease in wound volume by X% from date: (specify in notes) Date Initiated: 08/27/2021 Target Resolution Date:  09/18/2021 Goal Status: Active Patient/caregiver will verbalize understanding of skin care regimen Date Initiated: 08/27/2021 Target Resolution Date: 09/18/2021 Goal Status: Active Ulcer/skin breakdown will have a volume reduction of 30% by week 4 Date Initiated: 08/27/2021 Target Resolution Date: 09/18/2021 Goal Status: Active Interventions: Assess patient/caregiver ability to obtain necessary supplies Assess patient/caregiver ability to perform ulcer/skin care regimen upon admission and as needed Assess ulceration(s) every visit Notes: Electronic Signature(s) Signed: 09/11/2021 12:58:23 PM By: Sharyn Creamer RN, BSN Entered By: Sharyn Creamer on 09/11/2021 10:39:44 -------------------------------------------------------------------------------- Pain Assessment Details Patient Name: Date of Service: Celine Mans NKA FO NDZEYUF, [name] 09/11/2021 10:00 A M Medical Record Number: [id number] Patient Account Number: [id number] Date of Birth/Sex: Treating RN: 1945-09-14 (76 y.o. [name], [name] Primary Care Taison Celani: Karle Plumber Other Clinician: Referring Villa Burgin: Treating Mayah Urquidi/Extender: Sammuel Bailiff in Treatment: 2 Active Problems Location of Pain Severity and Description of Pain Patient Has Paino No Site Locations Pain Management and Medication Current Pain Management: Electronic Signature(s) Signed: 09/11/2021 12:50:54 PM By: Erenest Blank Signed: 09/17/2021 3:56:15 PM By: Rhae Hammock RN Entered By: Erenest Blank on 09/11/2021 10:20:01 -------------------------------------------------------------------------------- Patient/Caregiver Education Details Patient Name: Date of Service: A SEGHA NKA Mickel Fuchs, [name] 7/28/2023andnbsp10:00 A M Medical Record Number: [id number] Patient Account Number: [id number] Date of Birth/Gender: Treating RN: 05/14/1945 (76 y.o. Donalda Ewings Primary Care Physician: Karle Plumber Other  Clinician: Referring Physician: Treating Physician/Extender: Sammuel Bailiff in Treatment: 2 Education Assessment Education Provided To: Patient Education Topics Provided Wound/Skin Impairment: Methods: Explain/Verbal Responses: State content correctly Electronic Signature(s) Signed: 09/11/2021 12:58:23 PM By: Sharyn Creamer RN, BSN Entered By: Sharyn Creamer on 09/11/2021 10:40:11 -------------------------------------------------------------------------------- Wound Assessment Details Patient Name: Date of Service: [name], [name] 09/11/2021 10:00 A M Medical Record Number: [id number] Patient Account Number: [id number] Date of Birth/Sex: Treating RN: 12-22-1945 (76 y.o. [name], [name] Primary Care Zacarias Krauter: Karle Plumber Other Clinician: Referring Lauretta Sallas: Treating Joelee Snoke/Extender: Sammuel Bailiff in Treatment: 2 Wound Status Wound Number: 1 Primary T be determined o Etiology: Wound Location: Left, Circumferential Foot Wound Status: Open Wounding Event: Blister Comorbid Glaucoma, Hypertension, Hepatitis C, Type II Diabetes, Date Acquired: 07/16/2021 History: Neuropathy Weeks Of Treatment: 2 Clustered Wound: Yes Photos Wound Measurements Length: (cm) 1 Width: (cm) 0.3 Depth: (cm) 0.1 Clustered Quantity: 15 Area: (cm) 0.236 Volume: (cm) 0.024 % Reduction in Area: 99.9% % Reduction in Volume: 100% Epithelialization: Large (67-100%) Tunneling: No Undermining: No Wound Description Classification: Full Thickness With Exposed Support Structures Wound Margin: Distinct, outline attached Exudate Amount: Large Exudate Type: Purulent Exudate Color: yellow, brown, green Foul Odor After Cleansing: Yes Due to Product Use: No Slough/Fibrino Yes Wound Bed Granulation Amount: Small (1-33%) Exposed Structure Granulation Quality: Red, Pink Fascia Exposed: No Necrotic Amount: Large (67-100%) Fat  Layer (Subcutaneous Tissue) Exposed: Yes Necrotic Quality: Adherent  Slough Tendon Exposed: No Muscle Exposed: No Joint Exposed: No Bone Exposed: No Treatment Notes Wound #1 (Foot) Wound Laterality: Left, Circumferential Cleanser Soap and Water Discharge Instruction: May shower and wash wound with dial antibacterial soap and water prior to dressing change. Peri-Wound Care Skin Prep Discharge Instruction: Use skin prep as directed Topical Primary Dressing MediHoney Gel, tube 1.5 (oz) Discharge Instruction: Apply to wound bed as instructed Secondary Dressing Woven Gauze Sponge, Non-Sterile 4x4 in Discharge Instruction: Apply over primary dressing as directed. Zetuvit Plus Silicone Border Dressing 7x7(in/in) Discharge Instruction: Apply silicone border over primary dressing as directed. Secured With [employer], 4.5x3.1 (in/yd) Discharge Instruction: Secure with Kerlix as directed. 80M Medipore H Soft Cloth Surgical T ape, 4 x 10 (in/yd) Discharge Instruction: Secure with tape as directed. Stretch Net Size 5, 10 (yds) Compression Wrap Compression Stockings Add-Ons Electronic Signature(s) Signed: 09/11/2021 12:58:23 PM By: Sharyn Creamer RN, BSN Signed: 09/17/2021 3:56:15 PM By: Rhae Hammock RN Entered By: Sharyn Creamer on 09/11/2021 10:43:53 -------------------------------------------------------------------------------- Wound Assessment Details Patient Name: Date of Service: [name], [name] 09/11/2021 10:00 A M Medical Record Number: [id number] Patient Account Number: [id number] Date of Birth/Sex: Treating RN: 14-Nov-1945 (76 y.o. [name], [name] Primary Care Siris Hoos: Karle Plumber Other Clinician: Referring Norvell Caswell: Treating Kshawn Canal/Extender: Sammuel Bailiff in Treatment: 2 Wound Status Wound Number: 2 Primary T be determined o Etiology: Wound Location: Right, Circumferential Foot Wound Status:  Open Wounding Event: Blister Comorbid Glaucoma, Hypertension, Hepatitis C, Type II Diabetes, Date Acquired: 07/16/2021 History: Neuropathy Weeks Of Treatment: 2 Clustered Wound: Yes Photos Wound Measurements Length: (cm) 1.9 Width: (cm) 1 Depth: (cm) 0.1 Clustered Quantity: 15 Area: (cm) 1.492 Volume: (cm) 0.149 % Reduction in Area: 99.5% % Reduction in Volume: 99.7% Epithelialization: None Tunneling: No Undermining: No Wound Description Classification: Full Thickness With Exposed Support Structures Wound Margin: Distinct, outline attached Exudate Amount: Large Exudate Type: Purulent Exudate Color: yellow, brown, green Foul Odor After Cleansing: Yes Due to Product Use: No Slough/Fibrino Yes Wound Bed Granulation Amount: Small (1-33%) Exposed Structure Granulation Quality: Red, Pink Fascia Exposed: No Necrotic Amount: Large (67-100%) Fat Layer (Subcutaneous Tissue) Exposed: Yes Necrotic Quality: Eschar, Adherent Slough Tendon Exposed: No Muscle Exposed: No Joint Exposed: No Bone Exposed: No Treatment Notes Wound #2 (Foot) Wound Laterality: Right, Circumferential Cleanser Soap and Water Discharge Instruction: May shower and wash wound with dial antibacterial soap and water prior to dressing change. Peri-Wound Care Skin Prep Discharge Instruction: Use skin prep as directed Topical Primary Dressing MediHoney Gel, tube 1.5 (oz) Discharge Instruction: Apply to wound bed as instructed Secondary Dressing Woven Gauze Sponge, Non-Sterile 4x4 in Discharge Instruction: Apply over primary dressing as directed. Zetuvit Plus Silicone Border Dressing 7x7(in/in) Discharge Instruction: Apply silicone border over primary dressing as directed. Secured With [employer], 4.5x3.1 (in/yd) Discharge Instruction: Secure with Kerlix as directed. 80M Medipore H Soft Cloth Surgical T ape, 4 x 10 (in/yd) Discharge Instruction: Secure with tape as directed. Stretch Net Size 5,  10 (yds) Compression Wrap Compression Stockings Add-Ons Electronic Signature(s) Signed: 09/11/2021 12:50:54 PM By: Erenest Blank Signed: 09/17/2021 3:56:15 PM By: Rhae Hammock RN Entered By: Erenest Blank on 09/11/2021 10:30:57 -------------------------------------------------------------------------------- Vitals Details Patient Name: Date of Service: [name], [name] 09/11/2021 10:00 A M Medical Record Number: [id number] Patient Account Number: [id number] Date of Birth/Sex: Treating RN: 1945-05-21 (76 y.o. [name], [name] Primary Care Enola Siebers: Karle Plumber Other Clinician: Referring Jessikah Dicker: Treating Keeva Reisen/Extender: Kalman Shan  Karle Plumber Weeks in Treatment: 2 Vital Signs Time Taken: 10:19 Temperature (F): 98 Height (in): 64 Pulse (bpm): 80 Weight (lbs): 150 Respiratory Rate (breaths/min): 18 Body Mass Index (BMI): 25.7 Blood Pressure (mmHg): 121/70 Capillary Blood Glucose (mg/dl): 97 Reference Range: 80 - 120 mg / dl Electronic Signature(s) Signed: 09/11/2021 12:50:54 PM By: Erenest Blank Entered By: Erenest Blank on 09/11/2021 10:19:45

## 2021-09-17 NOTE — Progress Notes (Signed)
SHALUNDA, LINDH ([id number]) Visit Report for 09/11/2021 Chief Complaint Document Details Patient Name: Date of Service: [name] SEGHA NKA FO [name], [name] [name] 09/11/2021 10:00 [name] M Medical Record Number: [id number] Patient Account Number: [id number] Date of Birth/Sex: Treating RN: 11/23/1945 (75 y.o. [name], [name] Primary Care Provider: Karle Plumber Other Clinician: Referring Provider: Treating Provider/Extender: Sammuel Bailiff in Treatment: 2 Information Obtained from: Patient Chief Complaint 08/27/2021; multiple open wounds to the feet bilaterally Electronic Signature(s) Signed: 09/11/2021 12:55:11 PM By: Kalman Shan DO Entered By: Kalman Shan on 09/11/2021 11:58:32 -------------------------------------------------------------------------------- Debridement Details Patient Name: Date of Service: [name] SEGHA NKA FO [name], [name] [name] 09/11/2021 10:00 [name] M Medical Record Number: [id number] Patient Account Number: [id number] Date of Birth/Sex: Treating RN: 1946-01-07 (76 y.o. Donalda Ewings Primary Care Provider: Karle Plumber Other Clinician: Referring Provider: Treating Provider/Extender: Sammuel Bailiff in Treatment: 2 Debridement Performed for Assessment: Wound #1 Left,Circumferential Foot Performed By: Physician Kalman Shan, DO Debridement Type: Debridement Level of Consciousness (Pre-procedure): Awake and Alert Pre-procedure Verification/Time Out Yes - 10:40 Taken: Start Time: 10:42 Pain Control: Lidocaine 4% T opical Solution T Area Debrided (L x W): otal 1 (cm) x 0.3 (cm) = 0.3 (cm) Tissue and other material debrided: Non-Viable, Slough, Subcutaneous, Slough Level: Skin/Subcutaneous Tissue Debridement Description: Excisional Instrument: Curette Bleeding: Minimum Hemostasis Achieved: Pressure Procedural Pain: 0 Post Procedural Pain: 0 Response to Treatment: Procedure was tolerated  well Level of Consciousness (Post- Awake and Alert procedure): Post Debridement Measurements of Total Wound Length: (cm) 1 Width: (cm) 0.3 Depth: (cm) 0.1 Volume: (cm) 0.024 Character of Wound/Ulcer Post Debridement: Improved Post Procedure Diagnosis Same as Pre-procedure Electronic Signature(s) Signed: 09/11/2021 12:55:11 PM By: Kalman Shan DO Signed: 09/11/2021 12:58:23 PM By: Sharyn Creamer RN, BSN Entered By: Sharyn Creamer on 09/11/2021 10:45:19 -------------------------------------------------------------------------------- HPI Details Patient Name: Date of Service: [name] SEGHA NKA FO [name], [name] [name] 09/11/2021 10:00 [name] M Medical Record Number: [id number] Patient Account Number: [id number] Date of Birth/Sex: Treating RN: 1945/07/14 (76 y.o. Benjaman Lobe Primary Care Provider: Karle Plumber Other Clinician: Referring Provider: Treating Provider/Extender: Sammuel Bailiff in Treatment: 2 History of Present Illness HPI Description: 08/27/2021 Ms. 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 Patient states that the rash started on her right foot on 07/19/2021. 2 days prior to this she was started on Epclusa for treatment of hep C. She was originally treated for potential tinea pedis with Lamisil however this did not improve symptoms. She developed worsening wounds and rash and was started on prednisone 40 mg daily by her primary care physician. Patient reports improvement in symptoms. She is going to see dermatology on 09/09/2021. She is using betamethasone cream to the rash. She had blood work done by her PCP that included CCP, sed rate, ANA and RF. All results within normal limits. Patient currently  denies signs of infection. Patient states that infectious disease does not think this is caused by the [place]. 7/28; patient presents for follow-up. Daughter is present during the encounter. Patient has been using Dakin's wet-to-dry dressings to the wound beds. She is being tapered off of prednisone and is currently on 30 mg daily. Her wound culture grew Pseudomonas and MRSA. She states she took an antibiotic for  but cannot remember the name. She completed this course. Her biopsy showed lichenoid dermatitis. She states that dermatology started her on triamcinolone cream and Silvadene. She reports significant improvement in wound healing. Electronic Signature(s) Signed: 09/11/2021 12:55:11 PM By: Kalman Shan DO Entered By: Kalman Shan on 09/11/2021 12:17:37 -------------------------------------------------------------------------------- Physical Exam Details Patient Name: Date of Service: [name] SEGHA NKA FO [name], [name] [name] 09/11/2021 10:00 [name] M Medical Record Number: [id number] Patient Account Number: [id number] Date of Birth/Sex: Treating RN: Jun 25, 1945 (76 y.o. [name], [name] Primary Care Provider: Karle Plumber Other Clinician: Referring Provider: Treating Provider/Extender: Sammuel Bailiff in Treatment: 2 Constitutional respirations regular, non-labored and within target range for patient.. Cardiovascular 2+ dorsalis pedis/posterior tibialis pulses. Psychiatric pleasant and cooperative. Notes Right lower extremity: T the dorsal lateral aspect of the foot there is an open wound with granulation tissue present. o Left lower extremity: T the dorsal lateral aspect of the left foot there is an open wound with granulation tissue and nonviable tissue. o No signs of surrounding infection to any of the wound beds. Electronic Signature(s) Signed: 09/11/2021 12:55:11 PM By: Kalman Shan DO Entered By: Kalman Shan on 09/11/2021  12:20:27 -------------------------------------------------------------------------------- Physician Orders Details Patient Name: Date of Service: [name] SEGHA NKA FO [name], [name] [name] 09/11/2021 10:00 [name] M Medical Record Number: [id number] Patient Account Number: [id number] Date of Birth/Sex: Treating RN: September 19, 1945 (76 y.o. Donalda Ewings Primary Care Provider: Karle Plumber Other Clinician: Referring Provider: Treating Provider/Extender: Sammuel Bailiff in Treatment: 2 Verbal / Phone Orders: No Diagnosis Coding Follow-up Appointments ppointment in 2 weeks. - Dr. Martie Round and Allayne Butcher Room # 9 Return [name] Bathing/ Shower/ Hygiene May shower and wash wound with soap and water. Non Wound Condition Other Non Wound Condition Orders/Instructions: - Continue the betaclomethazone cream to legs and hands. Wound Treatment Wound #1 - Foot Wound Laterality: Left, Circumferential Cleanser: Soap and Water 1 x Per Day/30 Days Discharge Instructions: May shower and wash wound with dial antibacterial soap and water prior to dressing change. Peri-Wound Care: Skin Prep 1 x Per Day/30 Days Discharge Instructions: Use skin prep as directed Prim Dressing: MediHoney Gel, tube 1.5 (oz) 1 x Per Day/30 Days ary Discharge Instructions: Apply to wound bed as instructed Secondary Dressing: Woven Gauze Sponge, Non-Sterile 4x4 in (Generic) 1 x Per Day/30 Days Discharge Instructions: Apply over primary dressing as directed. Secondary Dressing: Zetuvit Plus Silicone Border Dressing 7x7(in/in) (Generic) 1 x Per Day/30 Days Discharge Instructions: Apply silicone border over primary dressing as directed. Secured With: [employer], 4.5x3.1 (in/yd) (Generic) 1 x Per Day/30 Days Discharge Instructions: Secure with Kerlix as directed. Secured With: 21M Medipore H Soft Cloth Surgical T ape, 4 x 10 (in/yd) (Generic) 1 x Per Day/30 Days Discharge Instructions: Secure with tape as  directed. Secured With: [employer] Size 5, 10 (yds) (Generic) 1 x Per Day/30 Days Wound #2 - Foot Wound Laterality: Right, Circumferential Cleanser: Soap and Water 1 x Per Day/30 Days Discharge Instructions: May shower and wash wound with dial antibacterial soap and water prior to dressing change. Peri-Wound Care: Skin Prep 1 x Per Day/30 Days Discharge Instructions: Use skin prep as directed Prim Dressing: MediHoney Gel, tube 1.5 (oz) 1 x Per Day/30 Days ary Discharge Instructions: Apply to wound bed as instructed Secondary Dressing: Woven Gauze Sponge, Non-Sterile 4x4 in (Generic) 1 x Per Day/30 Days Discharge Instructions: Apply over primary dressing as directed. Secondary Dressing: Zetuvit Plus Silicone Border Dressing 7x7(in/in) (Generic) 1 x Per  Day/30 Days Discharge Instructions: Apply silicone border over primary dressing as directed. Secured With: [employer], 4.5x3.1 (in/yd) (Generic) 1 x Per Day/30 Days Discharge Instructions: Secure with Kerlix as directed. Secured With: 58M Medipore H Soft Cloth Surgical T ape, 4 x 10 (in/yd) (Generic) 1 x Per Day/30 Days Discharge Instructions: Secure with tape as directed. Secured With: [employer] Size 5, 10 (yds) (Generic) 1 x Per Day/30 Days Electronic Signature(s) Signed: 09/11/2021 12:55:11 PM By: Kalman Shan DO Entered By: Kalman Shan on 09/11/2021 12:20:35 -------------------------------------------------------------------------------- Problem List Details Patient Name: Date of Service: [name] SEGHA NKA FO [name], [name] [name] 09/11/2021 10:00 [name] M Medical Record Number: [id number] Patient Account Number: [id number] Date of Birth/Sex: Treating RN: Aug 07, 1945 (76 y.o. [name], [name] Primary Care Provider: Karle Plumber Other Clinician: Referring Provider: Treating Provider/Extender: Sammuel Bailiff in Treatment: 2 Active Problems ICD-10 Encounter Code Description Active Date  MDM Diagnosis [phone] Non-pressure chronic ulcer of other part of right foot with fat layer exposed 08/27/2021 No Yes L97.522 Non-pressure chronic ulcer of other part of left foot with fat layer exposed 08/27/2021 No Yes 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 08/27/2021 No Yes C22.0 Liver cell carcinoma 08/27/2021 No Yes E11.628 Type 2 diabetes mellitus with other skin complications 09/09/3662 No Yes E11.621 Type 2 diabetes mellitus with foot ulcer 08/27/2021 No Yes Inactive Problems Resolved Problems Electronic Signature(s) Signed: 09/11/2021 12:55:11 PM By: Kalman Shan DO Entered By: Kalman Shan on 09/11/2021 11:58:18 -------------------------------------------------------------------------------- Progress Note Details Patient Name: Date of Service: [name] SEGHA NKA FO [name], [name] [name] 09/11/2021 10:00 [name] M Medical Record Number: [id number] Patient Account Number: [id number] Date of Birth/Sex: Treating RN: 30-Aug-1945 (76 y.o. [name], [name] Primary Care Provider: Karle Plumber Other Clinician: Referring Provider: Treating Provider/Extender: Sammuel Bailiff in Treatment: 2 Subjective Chief Complaint Information obtained from Patient 08/27/2021; multiple open wounds to the feet bilaterally History of Present Illness (HPI) 08/27/2021 Ms. 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 Patient states that the rash started on her right foot on 07/19/2021. 2 days prior to this she was started on Epclusa for treatment of hep C. She was originally treated for potential tinea pedis with Lamisil however this did not improve symptoms. She developed worsening wounds and rash and was started on prednisone 40 mg daily by her primary  care physician. Patient reports improvement in symptoms. She is going to see dermatology on 09/09/2021. She is using betamethasone cream to the rash. She had blood work done by her PCP that included CCP, sed rate, ANA and RF. All results within normal limits. Patient currently denies signs of infection. Patient states that infectious disease does not think this is caused by the [place]. 7/28; patient presents for follow-up. Daughter is present during the encounter. Patient has been using Dakin's wet-to-dry dressings to the wound beds. She is being tapered off of prednisone and is currently on 30 mg daily. Her wound culture grew Pseudomonas and MRSA. She states she took an antibiotic for but cannot remember the name. She completed this course. Her biopsy showed lichenoid dermatitis. She states that dermatology started her on triamcinolone cream and Silvadene. She reports significant improvement in wound healing. Patient History Information obtained from Patient, Chart. Family History Unknown History. Social History  Never smoker, Marital Status - Widowed, Alcohol Use - Never, Drug Use - No History, Caffeine Use - Rarely. Medical History Eyes Patient has history of Glaucoma Cardiovascular Patient has history of Hypertension Gastrointestinal Patient has history of Hepatitis C Endocrine Patient has history of Type II Diabetes Neurologic Patient has history of Neuropathy Medical [name] Surgical History Notes nd Immunological on immunotherapy for the hepatitis carcinoma x 1 year; on epclusa for the hep c x 1 month Oncologic hepatocarcinoma Objective Constitutional respirations regular, non-labored and within target range for patient.. Vitals Time Taken: 10:19 AM, Height: 64 in, Weight: 150 lbs, BMI: 25.7, Temperature: 98 F, Pulse: 80 bpm, Respiratory Rate: 18 breaths/min, Blood Pressure: 121/70 mmHg, Capillary Blood Glucose: 97 mg/dl. Cardiovascular 2+ dorsalis pedis/posterior tibialis  pulses. Psychiatric pleasant and cooperative. General Notes: Right lower extremity: T the dorsal lateral aspect of the foot there is an open wound with granulation tissue present. Left lower extremity: T o o the dorsal lateral aspect of the left foot there is an open wound with granulation tissue and nonviable tissue. No signs of surrounding infection to any of the wound beds. Integumentary (Hair, Skin) Wound #1 status is Open. Original cause of wound was Blister. The date acquired was: 07/16/2021. The wound has been in treatment 2 weeks. The wound is located on the Left,Circumferential Foot. The wound measures 1cm length x 0.3cm width x 0.1cm depth; 0.236cm^2 area and 0.024cm^3 volume. There is Fat Layer (Subcutaneous Tissue) exposed. There is no tunneling or undermining noted. There is [name] large amount of purulent drainage noted. Foul odor after cleansing was noted. The wound margin is distinct with the outline attached to the wound base. There is small (1-33%) red, pink granulation within the wound bed. There is [name] large (67-100%) amount of necrotic tissue within the wound bed including Adherent Slough. Wound #2 status is Open. Original cause of wound was Blister. The date acquired was: 07/16/2021. The wound has been in treatment 2 weeks. The wound is located on the Right,Circumferential Foot. The wound measures 1.9cm length x 1cm width x 0.1cm depth; 1.492cm^2 area and 0.149cm^3 volume. There is Fat Layer (Subcutaneous Tissue) exposed. There is no tunneling or undermining noted. There is [name] large amount of purulent drainage noted. Foul odor after cleansing was noted. The wound margin is distinct with the outline attached to the wound base. There is small (1-33%) red, pink granulation within the wound bed. There is [name] large (67-100%) amount of necrotic tissue within the wound bed including Eschar and Adherent Slough. Assessment Active Problems ICD-10 Non-pressure chronic ulcer of other part of right  foot with fat layer exposed Non-pressure chronic ulcer of other part of left foot with fat layer exposed Chronic viral hepatitis C Liver cell carcinoma Type 2 diabetes mellitus with other skin complications Type 2 diabetes mellitus with foot ulcer Patient has shown significant improvement in wound healing over the past week. Based on biopsy results And improvement with prednisone I do suspect that her wounds were [name] trigger from her hepatitis C medication. She is currently on prednisone and being tapered. At this time I debrided nonviable tissue and recommended Medihoney to the remaining open areas. Follow-up in 2 weeks. Procedures Wound #1 Pre-procedure diagnosis of Wound #1 is [name] T be determined located on the Left,Circumferential Foot . There was [name] Excisional Skin/Subcutaneous Tissue o Debridement with [name] total area of 0.3 sq cm performed by Kalman Shan, DO. With the following instrument(s): Curette to remove Non-Viable tissue/material. Material removed includes Subcutaneous Tissue  and Slough and after achieving pain control using Lidocaine 4% T opical Solution. No specimens were taken. [name] time out was conducted at 10:40, prior to the start of the procedure. [name] Minimum amount of bleeding was controlled with Pressure. The procedure was tolerated well with [name] pain level of 0 throughout and [name] pain level of 0 following the procedure. Post Debridement Measurements: 1cm length x 0.3cm width x 0.1cm depth; 0.024cm^3 volume. Character of Wound/Ulcer Post Debridement is improved. Post procedure Diagnosis Wound #1: Same as Pre-Procedure Plan Follow-up Appointments: Return Appointment in 2 weeks. - Dr. Martie Round and Allayne Butcher Room # 9 Bathing/ Shower/ Hygiene: May shower and wash wound with soap and water. Non Wound Condition: Other Non Wound Condition Orders/Instructions: - Continue the betaclomethazone cream to legs and hands. WOUND #1: - Foot Wound Laterality: Left, Circumferential Cleanser: Soap and  Water 1 x Per Day/30 Days Discharge Instructions: May shower and wash wound with dial antibacterial soap and water prior to dressing change. Peri-Wound Care: Skin Prep 1 x Per Day/30 Days Discharge Instructions: Use skin prep as directed Prim Dressing: MediHoney Gel, tube 1.5 (oz) 1 x Per Day/30 Days ary Discharge Instructions: Apply to wound bed as instructed Secondary Dressing: Woven Gauze Sponge, Non-Sterile 4x4 in (Generic) 1 x Per Day/30 Days Discharge Instructions: Apply over primary dressing as directed. Secondary Dressing: Zetuvit Plus Silicone Border Dressing 7x7(in/in) (Generic) 1 x Per Day/30 Days Discharge Instructions: Apply silicone border over primary dressing as directed. Secured With: [employer], 4.5x3.1 (in/yd) (Generic) 1 x Per Day/30 Days Discharge Instructions: Secure with Kerlix as directed. Secured With: 680M Medipore H Soft Cloth Surgical T ape, 4 x 10 (in/yd) (Generic) 1 x Per Day/30 Days Discharge Instructions: Secure with tape as directed. Secured With: [employer] Size 5, 10 (yds) (Generic) 1 x Per Day/30 Days WOUND #2: - Foot Wound Laterality: Right, Circumferential Cleanser: Soap and Water 1 x Per Day/30 Days Discharge Instructions: May shower and wash wound with dial antibacterial soap and water prior to dressing change. Peri-Wound Care: Skin Prep 1 x Per Day/30 Days Discharge Instructions: Use skin prep as directed Prim Dressing: MediHoney Gel, tube 1.5 (oz) 1 x Per Day/30 Days ary Discharge Instructions: Apply to wound bed as instructed Secondary Dressing: Woven Gauze Sponge, Non-Sterile 4x4 in (Generic) 1 x Per Day/30 Days Discharge Instructions: Apply over primary dressing as directed. Secondary Dressing: Zetuvit Plus Silicone Border Dressing 7x7(in/in) (Generic) 1 x Per Day/30 Days Discharge Instructions: Apply silicone border over primary dressing as directed. Secured With: [employer], 4.5x3.1 (in/yd) (Generic) 1 x Per Day/30  Days Discharge Instructions: Secure with Kerlix as directed. Secured With: 680M Medipore H Soft Cloth Surgical T ape, 4 x 10 (in/yd) (Generic) 1 x Per Day/30 Days Discharge Instructions: Secure with tape as directed. Secured With: [employer] Size 5, 10 (yds) (Generic) 1 x Per Day/30 Days 1. In office sharp debridement 2. Medihoney 3. Follow-up in 2 weeks Electronic Signature(s) Signed: 09/11/2021 12:55:11 PM By: Kalman Shan DO Entered By: Kalman Shan on 09/11/2021 12:22:03 -------------------------------------------------------------------------------- HxROS Details Patient Name: Date of Service: [name] SEGHA NKA FO [name], [name] [name] 09/11/2021 10:00 [name] M Medical Record Number: [id number] Patient Account Number: [id number] Date of Birth/Sex: Treating RN: [birth year] (76 y.o. [name], [name] Primary Care Provider: Karle Plumber Other Clinician: Referring Provider: Treating Provider/Extender: Sammuel Bailiff in Treatment: 2 Information Obtained From Patient Chart Eyes Medical History: Positive for: Glaucoma Cardiovascular Medical History: Positive for: Hypertension Gastrointestinal Medical History: Positive  for: Hepatitis C Endocrine Medical History: Positive for: Type II Diabetes Treated with: Insulin, Oral agents Blood sugar tested every day: Yes Tested : Blood sugar testing results: Breakfast: 90; Dinner: 150 Immunological Medical History: Past Medical History Notes: on immunotherapy for the hepatitis carcinoma x 1 year; on epclusa for the hep c x 1 month Neurologic Medical History: Positive for: Neuropathy Oncologic Medical History: Past Medical History Notes: hepatocarcinoma HBO Extended History Items Eyes: Glaucoma Immunizations Pneumococcal Vaccine: Received Pneumococcal Vaccination: Yes Received Pneumococcal Vaccination On or After 60th Birthday: Yes Implantable Devices Yes Family and Social History Unknown History:  Yes; Never smoker; Marital Status - Widowed; Alcohol Use: Never; Drug Use: No History; Caffeine Use: Rarely; Financial Concerns: No; Food, Clothing or Shelter Needs: No; Support System Lacking: No; Transportation Concerns: No Electronic Signature(s) Signed: 09/11/2021 12:55:11 PM By: Kalman Shan DO Signed: 09/17/2021 3:56:15 PM By: Rhae Hammock RN Entered By: Kalman Shan on 09/11/2021 12:17:43 -------------------------------------------------------------------------------- SuperBill Details Patient Name: Date of Service: Celine Mans NKA FO [name], [name] [name] 09/11/2021 Medical Record Number: [id number] Patient Account Number: [id number] Date of Birth/Sex: Treating RN: Oct 10, 1945 (76 y.o. [name], [name] Primary Care Provider: Karle Plumber Other Clinician: Referring Provider: Treating Provider/Extender: Sammuel Bailiff in Treatment: 2 Diagnosis Coding ICD-10 Codes Code Description [phone] Non-pressure chronic ulcer of other part of right foot with fat layer exposed L97.522 Non-pressure chronic ulcer of other part of left foot with fat layer exposed 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 C22.0 Liver cell carcinoma E11.628 Type 2 diabetes mellitus with other skin complications M46.803 Type 2 diabetes mellitus with foot ulcer Facility Procedures CPT4 Code: [id number] Description: 00370 - DEB SUBQ TISSUE 20 SQ CM/< ICD-10 Diagnosis Description L97.522 Non-pressure chronic ulcer of other part of left foot with fat layer exposed Modifier: Quantity: 1 Physician Procedures : CPT4 Code Description Modifier [id number] - WC PHYS SUBQ TISS 20 SQ CM ICD-10 Diagnosis Description L97.522 Non-pressure chronic ulcer of other part of left foot with fat layer exposed Quantity: 1 Electronic Signature(s) Signed: 09/11/2021 12:55:11 PM By: Kalman Shan DO Entered By: Kalman Shan on 09/11/2021 12:22:17

## 2021-09-22 ENCOUNTER — Ambulatory Visit: Payer: Self-pay | Admitting: Internal Medicine

## 2021-09-22 ENCOUNTER — Encounter: Payer: Self-pay | Admitting: Gastroenterology

## 2021-09-23 ENCOUNTER — Other Ambulatory Visit: Payer: Self-pay | Admitting: Internal Medicine

## 2021-09-23 ENCOUNTER — Encounter: Payer: Self-pay | Admitting: Hematology & Oncology

## 2021-09-23 ENCOUNTER — Other Ambulatory Visit: Payer: Self-pay

## 2021-09-23 MED ORDER — PREDNISONE 10 MG PO TABS
ORAL_TABLET | ORAL | 0 refills | Status: DC
  Filled 2021-09-23: qty 43, 32d supply, fill #0

## 2021-09-25 ENCOUNTER — Encounter: Payer: Self-pay | Admitting: Hematology & Oncology

## 2021-09-25 ENCOUNTER — Inpatient Hospital Stay: Payer: Self-pay

## 2021-09-25 ENCOUNTER — Inpatient Hospital Stay (HOSPITAL_BASED_OUTPATIENT_CLINIC_OR_DEPARTMENT_OTHER): Payer: Self-pay | Admitting: Hematology & Oncology

## 2021-09-25 ENCOUNTER — Telehealth: Payer: Self-pay | Admitting: *Deleted

## 2021-09-25 ENCOUNTER — Inpatient Hospital Stay: Payer: Self-pay | Attending: Hematology & Oncology

## 2021-09-25 VITALS — BP 156/74 | HR 65 | Resp 18

## 2021-09-25 VITALS — BP 142/56 | HR 81 | Temp 98.4°F | Resp 18 | Ht 64.0 in | Wt 152.0 lb

## 2021-09-25 DIAGNOSIS — B1920 Unspecified viral hepatitis C without hepatic coma: Secondary | ICD-10-CM | POA: Insufficient documentation

## 2021-09-25 DIAGNOSIS — C220 Liver cell carcinoma: Secondary | ICD-10-CM | POA: Insufficient documentation

## 2021-09-25 DIAGNOSIS — Z881 Allergy status to other antibiotic agents status: Secondary | ICD-10-CM | POA: Insufficient documentation

## 2021-09-25 DIAGNOSIS — Z5112 Encounter for antineoplastic immunotherapy: Secondary | ICD-10-CM | POA: Insufficient documentation

## 2021-09-25 DIAGNOSIS — Z79899 Other long term (current) drug therapy: Secondary | ICD-10-CM | POA: Insufficient documentation

## 2021-09-25 LAB — CMP (CANCER CENTER ONLY)
ALT: 23 U/L (ref 0–44)
AST: 15 U/L (ref 15–41)
Albumin: 3.9 g/dL (ref 3.5–5.0)
Alkaline Phosphatase: 114 U/L (ref 38–126)
Anion gap: 7 (ref 5–15)
BUN: 23 mg/dL (ref 8–23)
CO2: 24 mmol/L (ref 22–32)
Calcium: 10.4 mg/dL — ABNORMAL HIGH (ref 8.9–10.3)
Chloride: 101 mmol/L (ref 98–111)
Creatinine: 0.78 mg/dL (ref 0.44–1.00)
GFR, Estimated: >60 mL/min (ref >60)
Glucose, Bld: 333 mg/dL — ABNORMAL HIGH (ref 70–99)
Potassium: 3.9 mmol/L (ref 3.5–5.1)
Sodium: 132 mmol/L — ABNORMAL LOW (ref 135–145)
Total Bilirubin: 0.5 mg/dL (ref 0.3–1.2)
Total Protein: 6.3 g/dL — ABNORMAL LOW (ref 6.5–8.1)

## 2021-09-25 LAB — CBC WITH DIFFERENTIAL (CANCER CENTER ONLY)
Abs Immature Granulocytes: 0.02 10*3/uL (ref 0.00–0.07)
Basophils Absolute: 0 10*3/uL (ref 0.0–0.1)
Basophils Relative: 0 %
Eosinophils Absolute: 0 10*3/uL (ref 0.0–0.5)
Eosinophils Relative: 1 %
HCT: 39.8 % (ref 36.0–46.0)
Hemoglobin: 13.3 g/dL (ref 12.0–15.0)
Immature Granulocytes: 0 %
Lymphocytes Relative: 45 %
Lymphs Abs: 3.5 10*3/uL (ref 0.7–4.0)
MCH: 30.4 pg (ref 26.0–34.0)
MCHC: 33.4 g/dL (ref 30.0–36.0)
MCV: 90.9 fL (ref 80.0–100.0)
Monocytes Absolute: 0.5 10*3/uL (ref 0.1–1.0)
Monocytes Relative: 6 %
Neutro Abs: 3.7 10*3/uL (ref 1.7–7.7)
Neutrophils Relative %: 48 %
Platelet Count: 176 10*3/uL (ref 150–400)
RBC: 4.38 MIL/uL (ref 3.87–5.11)
RDW: 13.9 % (ref 11.5–15.5)
WBC Count: 7.8 10*3/uL (ref 4.0–10.5)
nRBC: 0 % (ref 0.0–0.2)

## 2021-09-25 LAB — LACTATE DEHYDROGENASE: LDH: 158 U/L (ref 98–192)

## 2021-09-25 MED ORDER — HEPARIN SOD (PORK) LOCK FLUSH 100 UNIT/ML IV SOLN
500.0000 [IU] | Freq: Once | INTRAVENOUS | Status: AC | PRN
  Administered 2021-09-25: 500 [IU]

## 2021-09-25 MED ORDER — SODIUM CHLORIDE 0.9 % IV SOLN: Freq: Once | INTRAVENOUS | Status: AC

## 2021-09-25 MED ORDER — SODIUM CHLORIDE 0.9% FLUSH
10.0000 mL | INTRAVENOUS | Status: DC | PRN
  Administered 2021-09-25: 10 mL

## 2021-09-25 MED ORDER — SODIUM CHLORIDE 0.9 % IV SOLN
480.0000 mg | Freq: Once | INTRAVENOUS | Status: AC
  Administered 2021-09-25: 480 mg via INTRAVENOUS
  Filled 2021-09-25: qty 48

## 2021-09-25 NOTE — Patient Instructions (Signed)
[facility]  Discharge Instructions: Thank you for choosing [facility] to provide your oncology and hematology care.   If you have a lab appointment with the Cancer Center, please go directly to the Cancer Center and check in at the registration area.  Wear comfortable clothing and clothing appropriate for easy access to any Portacath or PICC line.   We strive to give you quality time with your provider. You may need to reschedule your appointment if you arrive late (15 or more minutes).  Arriving late affects you and other patients whose appointments are after yours.  Also, if you miss three or more appointments without notifying the office, you may be dismissed from the clinic at the provider's discretion.      For prescription refill requests, have your pharmacy contact our office and allow 72 hours for refills to be completed.    Today you received the following chemotherapy and/or immunotherapy agents Opdivo      To help prevent nausea and vomiting after your treatment, we encourage you to take your nausea medication as directed.  BELOW ARE SYMPTOMS THAT SHOULD BE REPORTED IMMEDIATELY: *FEVER GREATER THAN 100.4 F (38 C) OR HIGHER *CHILLS OR SWEATING *NAUSEA AND VOMITING THAT IS NOT CONTROLLED WITH YOUR NAUSEA MEDICATION *UNUSUAL SHORTNESS OF BREATH *UNUSUAL BRUISING OR BLEEDING *URINARY PROBLEMS (pain or burning when urinating, or frequent urination) *BOWEL PROBLEMS (unusual diarrhea, constipation, pain near the anus) TENDERNESS IN MOUTH AND THROAT WITH OR WITHOUT PRESENCE OF ULCERS (sore throat, sores in mouth, or a toothache) UNUSUAL RASH, SWELLING OR PAIN  UNUSUAL VAGINAL DISCHARGE OR ITCHING   Items with * indicate a potential emergency and should be followed up as soon as possible or go to the Emergency Department if any problems should occur.  Please show the CHEMOTHERAPY ALERT CARD or IMMUNOTHERAPY ALERT CARD at check-in to the  Emergency Department and triage nurse. Should you have questions after your visit or need to cancel or reschedule your appointment, please contact [facility]  [phone] and follow the prompts.  Office hours are 8:00 a.m. to 4:30 p.m. Monday - Friday. Please note that voicemails left after 4:00 p.m. may not be returned until the following business day.  We are closed weekends and major holidays. You have access to a nurse at all times for urgent questions. Please call the main number to the clinic [phone] and follow the prompts.  For any non-urgent questions, you may also contact your provider using MyChart. We now offer e-Visits for anyone 18 and older to request care online for non-urgent symptoms. For details visit [email or web address].   Also download the MyChart app! Go to the app store, search "MyChart", open the app, select [facility], and log in with your MyChart username and password.  Masks are optional in the cancer centers. If you would like for your care team to wear a mask while they are taking care of you, please let them know. You may have one support person who is at least 75 years old accompany you for your appointments. 

## 2021-09-25 NOTE — Telephone Encounter (Signed)
Per 09/25/21 los - gave upcoming appointment - confirmed

## 2021-09-25 NOTE — Patient Instructions (Signed)

## 2021-09-25 NOTE — Progress Notes (Signed)
Hematology and Oncology Follow Up Visit  [name] [name] [id number] 1945-10-23 76 y.o. 09/25/2021   Principle Diagnosis:  Hepatocellular carcinoma-multifocal Hepatitis C   Current Therapy:        Status post cycle 1 of nivolumab/ipilimumab -- d/c on 10/13/2020 due to hepatic toxicity Nivolumab 480 mg IV every 4 weeks - s/p cycle #17 -- started 11/21/2020 --changed to 480 mg on 08/2021   Interim History:  [name] [name] is here today for follow-up and treatment.  I think we now think about starting her back on the nivolumab.  Her legs look whole lot better.  It is still not clear as to what triggered this.  I do not know if this was from her Hepatitis C treatment.  It could certainly be from the nivolumab him though she has been on the nivolumab for a year.  Her last alpha-fetoprotein was down to 6.2.  I think that the treatment for her Hepatitis C is clearly working.  Her LFTs are normal.  Her blood sugars are quite high.  She is still I did a steroid taper.  I think she is on 15 mg a day.  I do not think that this is going to be a problem with respect to the nivolumab.  She has had no cough.  There is been no nausea or vomiting.  She has had no change in bowel or bladder habits.  She has had no bleeding.  Overall, I would say performance status is probably ECOG 1.       Medications:  Allergies as of 09/25/2021       Reactions   Bactrim [sulfamethoxazole-trimethoprim] Swelling   Clarithromycin Swelling, Rash   Daughter is unsure if Clarithromycin or Amoxicillin caused rash and swelling.        Medication List        Accurate as of September 25, 2021 11:09 AM. If you have any questions, ask your nurse or doctor.          betamethasone dipropionate 0.05 % cream Apply topically 2 (two) times daily.   brimonidine 0.2 % ophthalmic solution Commonly known as: ALPHAGAN 1 drop 2 (two) times daily.   gabapentin 300 MG capsule Commonly known as:  NEURONTIN Take 1 capsule (300 mg total) by mouth once nightly at bedtime.   HumaLOG 100 UNIT/ML injection Generic drug: insulin lispro SSI with meals: 200-250 3 units, 251-300 6 units, 301-350 9 units, 351-400 12 units, 401-450 15 units, > 451 18 units under the skin as directed   HumuLIN 70/30 KwikPen (70-30) 100 UNIT/ML KwikPen Generic drug: insulin isophane & regular human KwikPen INJECT 30-40 units subcutANEOUSLY EVERY MORNING and 5-12 units EVERY EVENING   HumuLIN 70/30 (70-30) 100 UNIT/ML injection Generic drug: insulin NPH-regular Human Inject 30-40 units into the skin once every morning and 5-12 units once every evening.   linaclotide 145 MCG Caps capsule Commonly known as: Linzess Take 1 capsule (145 mcg total) by mouth once daily before breakfast. (Office visit for further refills)   losartan 50 MG tablet Commonly known as: COZAAR Take 1 tablet (50 mg total) by mouth at bedtime.   mupirocin ointment 2 % Commonly known as: BACTROBAN Apply once a day to biopsy portion of left foot   NIFEdipine 30 MG 24 hr tablet Commonly known as: PROCARDIA-XL/NIFEDICAL-XL Take 1 tablet (30 mg total) by mouth in the morning and at bedtime. MUST HAVE OFFICE VISIT FOR REFILLS   pantoprazole 40 MG tablet Commonly known as: Protonix Take 1  tablet (40 mg total) by mouth 2 (two) times daily. What changed: when to take this   polyethylene glycol powder 17 GM/SCOOP powder Commonly known as: GLYCOLAX/MIRALAX Take 17 g by mouth daily as needed.   predniSONE 10 MG tablet Commonly known as: DELTASONE TAKE 2 tabs BY MOUTH x 4 days then 1 AND 1/2 tabs  x 2 wks then 1 tab  x 2 wk   sodium hypochlorite 0.125 % Soln Commonly known as: DAKIN'S 1/4 STRENGTH moisten gauze for wet to dry dressings   Sofosbuvir-Velpatasvir 400-100 MG Tabs Commonly known as: Epclusa Take 1 tablet by mouth daily.   SSD 1 % cream Generic drug: silver sulfADIAZINE Apply to the open, ulcerated areas of the skin  once daily as needed.   Suprep Bowel Prep Kit 17.5-3.13-1.6 GM/177ML Soln Generic drug: Na Sulfate-K Sulfate-Mg Sulf Use as directed   traMADol 50 MG tablet Commonly known as: ULTRAM Take 1 tablet (50 mg total) by mouth every 8 (eight) hours as needed.   triamcinolone cream 0.1 % Commonly known as: KENALOG Apply to the itchy and irritated areas of the skin of the back, forearms, hands, legs and feet daily as needed   True Metrix Blood Glucose Test test strip Generic drug: glucose blood Use as instructed   TRUEplus 5-Bevel Pen Needles 31G X 6 MM Misc Generic drug: Insulin Pen Needle use as directed        Allergies:  Allergies  Allergen Reactions   Bactrim [Sulfamethoxazole-Trimethoprim] Swelling   Clarithromycin Swelling and Rash    Daughter is unsure if Clarithromycin or Amoxicillin caused rash and swelling.    Past Medical History, Surgical history, Social history, and Family History were reviewed and updated.  Review of Systems: Review of Systems  Constitutional: Negative.   HENT: Negative.    Eyes: Negative.   Respiratory: Negative.    Cardiovascular: Negative.   Gastrointestinal: Negative.   Genitourinary: Negative.   Musculoskeletal: Negative.   Skin: Negative.   Neurological:  Positive for tingling.  Endo/Heme/Allergies: Negative.   Psychiatric/Behavioral: Negative.       Physical Exam:  height is '5\' 4"'  (1.626 m) and weight is 152 lb (68.9 kg). Her oral temperature is 98.4 F (36.9 C). Her blood pressure is 142/56 (abnormal) and her pulse is 81. Her respiration is 18 and oxygen saturation is 99%.   Wt Readings from Last 3 Encounters:  09/25/21 152 lb (68.9 kg)  09/10/21 150 lb (68 kg)  09/07/21 150 lb (68 kg)    Physical Exam Vitals reviewed.  HENT:     Head: Normocephalic and atraumatic.  Eyes:     Pupils: Pupils are equal, round, and reactive to light.  Cardiovascular:     Rate and Rhythm: Normal rate and regular rhythm.     Heart  sounds: Normal heart sounds.  Pulmonary:     Effort: Pulmonary effort is normal.     Breath sounds: Normal breath sounds.  Abdominal:     General: Bowel sounds are normal.     Palpations: Abdomen is soft.     Comments: Abdominal exam shows a well-healed laparotomy scar.  She has no fluid wave.  There is no guarding or rebound tenderness to palpation.  There is no palpable liver or spleen tip.  Musculoskeletal:        General: No tenderness or deformity. Normal range of motion.     Cervical back: Normal range of motion.  Lymphadenopathy:     Cervical: No cervical adenopathy.  Skin:  General: Skin is warm and dry.     Findings: No erythema or rash.     Comments: Skin exam does show a extensive but somewhat healing papular rash on her lower legs.  This is below the knees.  She has some dressing on the foot on the right foot.  She has no open wounds.  There is no blisters.  Neurological:     Mental Status: She is alert and oriented to person, place, and time.  Psychiatric:        Behavior: Behavior normal.        Thought Content: Thought content normal.        Judgment: Judgment normal.      Lab Results  Component Value Date   WBC 7.8 09/25/2021   HGB 13.3 09/25/2021   HCT 39.8 09/25/2021   MCV 90.9 09/25/2021   PLT 176 09/25/2021   Lab Results  Component Value Date   FERRITIN 603 (H) 10/02/2020   IRON 31 (L) 10/02/2020   TIBC 241 10/02/2020   UIBC 211 10/02/2020   IRONPCTSAT 13 (L) 10/02/2020   Lab Results  Component Value Date   RBC 4.38 09/25/2021   No results found for: "KPAFRELGTCHN", "LAMBDASER", "KAPLAMBRATIO" No results found for: "IGGSERUM", "IGA", "IGMSERUM" No results found for: "TOTALPROTELP", "ALBUMINELP", "A1GS", "A2GS", "BETS", "BETA2SER", "GAMS", "MSPIKE", "SPEI"   Chemistry      Component Value Date/Time   NA 132 (L) 09/25/2021 1012   K 3.9 09/25/2021 1012   CL 101 09/25/2021 1012   CO2 24 09/25/2021 1012   BUN 23 09/25/2021 1012   CREATININE  0.78 09/25/2021 1012   CREATININE 0.58 (L) 08/20/2021 1541      Component Value Date/Time   CALCIUM 10.4 (H) 09/25/2021 1012   ALKPHOS 114 09/25/2021 1012   AST 15 09/25/2021 1012   ALT 23 09/25/2021 1012   BILITOT 0.5 09/25/2021 1012       Impression and Plan: [name] [name] is a very pleasant 76 yo female from [place] with hepatocellular carcinoma and Hepatitis C.   We will certainly try to treat her today.  Again I do not know if this rash was from the nivolumab.  Hopefully, this is not can be a problem for her again.  If so, we will clearly have to stop the nivolumab.  Again she is on a slow steroid taper.  I do not see a problem with steroid interfering with the nivolumab.  Her blood sugars are incredibly high.  I know she is doing her best to try to control these.  She is on insulin.  We will see what her alpha-fetoprotein level is.  I will plan to get her back in another month.   Volanda Napoleon, MD 8/11/202311:09 AM

## 2021-09-26 LAB — AFP TUMOR MARKER: AFP, Serum, Tumor Marker: 6.3 ng/mL (ref 0.0–9.2)

## 2021-09-29 ENCOUNTER — Encounter (HOSPITAL_BASED_OUTPATIENT_CLINIC_OR_DEPARTMENT_OTHER): Payer: Self-pay | Admitting: Internal Medicine

## 2021-10-01 ENCOUNTER — Other Ambulatory Visit: Payer: Self-pay

## 2021-10-01 ENCOUNTER — Encounter: Payer: Self-pay | Admitting: Hematology & Oncology

## 2021-10-05 ENCOUNTER — Other Ambulatory Visit: Payer: Self-pay

## 2021-10-06 ENCOUNTER — Encounter (HOSPITAL_BASED_OUTPATIENT_CLINIC_OR_DEPARTMENT_OTHER): Payer: Self-pay | Attending: Internal Medicine | Admitting: Internal Medicine

## 2021-10-06 DIAGNOSIS — B182 Chronic viral hepatitis C: Secondary | ICD-10-CM

## 2021-10-06 DIAGNOSIS — C220 Liver cell carcinoma: Secondary | ICD-10-CM

## 2021-10-06 DIAGNOSIS — E11621 Type 2 diabetes mellitus with foot ulcer: Secondary | ICD-10-CM | POA: Insufficient documentation

## 2021-10-06 DIAGNOSIS — Z79899 Other long term (current) drug therapy: Secondary | ICD-10-CM | POA: Insufficient documentation

## 2021-10-06 DIAGNOSIS — Z794 Long term (current) use of insulin: Secondary | ICD-10-CM | POA: Insufficient documentation

## 2021-10-06 DIAGNOSIS — E11628 Type 2 diabetes mellitus with other skin complications: Secondary | ICD-10-CM | POA: Insufficient documentation

## 2021-10-06 DIAGNOSIS — Z7952 Long term (current) use of systemic steroids: Secondary | ICD-10-CM | POA: Insufficient documentation

## 2021-10-06 DIAGNOSIS — L97512 Non-pressure chronic ulcer of other part of right foot with fat layer exposed: Secondary | ICD-10-CM

## 2021-10-06 DIAGNOSIS — L97522 Non-pressure chronic ulcer of other part of left foot with fat layer exposed: Secondary | ICD-10-CM

## 2021-10-06 NOTE — Progress Notes (Signed)
TAKOYA, JONAS ([id number]) Visit Report for 10/06/2021 Arrival Information Details Patient Name: Date of Service: Celine Mans NKA Mickel Fuchs, [place] RIA NA 10/06/2021 3:15 PM Medical Record Number: [id number] Patient Account Number: [id number] Date of Birth/Sex: Treating RN: 05-28-1945 (76 y.o. [name], [name] Primary Care Saryna Kneeland: Karle Plumber Other Clinician: Referring Timothee Gali: Treating Quintyn Dombek/Extender: Sammuel Bailiff in Treatment: 5 Visit Information History Since Last Visit Added or deleted any medications: No Patient Arrived: Ambulatory Any new allergies or adverse reactions: No Arrival Time: 15:14 Had a fall or experienced change in No Accompanied By: daughter activities of daily living that may affect Transfer Assistance: None risk of falls: Patient Identification Verified: Yes Signs or symptoms of abuse/neglect since last visito No Secondary Verification Process Completed: Yes Hospitalized since last visit: No Patient Requires Transmission-Based Precautions: No Implantable device outside of the clinic excluding No Patient Has Alerts: No cellular tissue based products placed in the center since last visit: Has Dressing in Place as Prescribed: Yes Pain Present Now: No Electronic Signature(s) Signed: 10/06/2021 3:41:21 PM By: Rhae Hammock RN Entered By: Rhae Hammock on 10/06/2021 15:15:13 -------------------------------------------------------------------------------- Clinic Level of Care Assessment Details Patient Name: Date of Service: [name], [name] 10/06/2021 3:15 PM Medical Record Number: [id number] Patient Account Number: [id number] Date of Birth/Sex: Treating RN: [birth year] (76 y.o. [name], [name] Primary Care Coran Dipaola: Karle Plumber Other Clinician: Referring Rishi Vicario: Treating Tobey Lippard/Extender: Sammuel Bailiff in Treatment: 5 Clinic Level of Care Assessment  Items TOOL 4 Quantity Score X- 1 0 Use when only an EandM is performed on FOLLOW-UP visit ASSESSMENTS - Nursing Assessment / Reassessment X- 1 10 Reassessment of Co-morbidities (includes updates in patient status) X- 1 5 Reassessment of Adherence to Treatment Plan ASSESSMENTS - Wound and Skin A ssessment / Reassessment '[]'$  - 0 Simple Wound Assessment / Reassessment - one wound X- 2 5 Complex Wound Assessment / Reassessment - multiple wounds '[]'$  - 0 Dermatologic / Skin Assessment (not related to wound area) ASSESSMENTS - Focused Assessment '[]'$  - 0 Circumferential Edema Measurements - multi extremities '[]'$  - 0 Nutritional Assessment / Counseling / Intervention '[]'$  - 0 Lower Extremity Assessment (monofilament, tuning fork, pulses) '[]'$  - 0 Peripheral Arterial Disease Assessment (using hand held doppler) ASSESSMENTS - Ostomy and/or Continence Assessment and Care '[]'$  - 0 Incontinence Assessment and Management '[]'$  - 0 Ostomy Care Assessment and Management (repouching, etc.) PROCESS - Coordination of Care X - Simple Patient / Family Education for ongoing care 1 15 '[]'$  - 0 Complex (extensive) Patient / Family Education for ongoing care X- 1 10 Staff obtains Programmer, systems, Records, T Results / Process Orders est '[]'$  - 0 Staff telephones HHA, Nursing Homes / Clarify orders / etc '[]'$  - 0 Routine Transfer to another Facility (non-emergent condition) '[]'$  - 0 Routine Hospital Admission (non-emergent condition) '[]'$  - 0 New Admissions / Biomedical engineer / Ordering NPWT Apligraf, etc. , '[]'$  - 0 Emergency Hospital Admission (emergent condition) X- 1 10 Simple Discharge Coordination '[]'$  - 0 Complex (extensive) Discharge Coordination PROCESS - Special Needs '[]'$  - 0 Pediatric / Minor Patient Management '[]'$  - 0 Isolation Patient Management '[]'$  - 0 Hearing / Language / Visual special needs '[]'$  - 0 Assessment of Community assistance (transportation, D/C planning, etc.) '[]'$  - 0 Additional  assistance / Altered mentation '[]'$  - 0 Support Surface(s) Assessment (bed, cushion, seat, etc.) INTERVENTIONS - Wound Cleansing / Measurement '[]'$  - 0 Simple Wound Cleansing - one wound X- 2 5 Complex  Wound Cleansing - multiple wounds X- 1 5 Wound Imaging (photographs - any number of wounds) '[]'$  - 0 Wound Tracing (instead of photographs) '[]'$  - 0 Simple Wound Measurement - one wound X- 2 5 Complex Wound Measurement - multiple wounds INTERVENTIONS - Wound Dressings '[]'$  - 0 Small Wound Dressing one or multiple wounds '[]'$  - 0 Medium Wound Dressing one or multiple wounds '[]'$  - 0 Large Wound Dressing one or multiple wounds '[]'$  - 0 Application of Medications - topical '[]'$  - 0 Application of Medications - injection INTERVENTIONS - Miscellaneous '[]'$  - 0 External ear exam '[]'$  - 0 Specimen Collection (cultures, biopsies, blood, body fluids, etc.) '[]'$  - 0 Specimen(s) / Culture(s) sent or taken to Lab for analysis '[]'$  - 0 Patient Transfer (multiple staff / Civil Service fast streamer / Similar devices) '[]'$  - 0 Simple Staple / Suture removal (25 or less) '[]'$  - 0 Complex Staple / Suture removal (26 or more) '[]'$  - 0 Hypo / Hyperglycemic Management (close monitor of Blood Glucose) '[]'$  - 0 Ankle / Brachial Index (ABI) - do not check if billed separately X- 1 5 Vital Signs Has the patient been seen at the hospital within the last three years: Yes Total Score: 90 Level Of Care: New/Established - Level 3 Electronic Signature(s) Signed: 10/06/2021 3:41:21 PM By: Rhae Hammock RN Entered By: Rhae Hammock on 10/06/2021 15:25:24 -------------------------------------------------------------------------------- Encounter Discharge Information Details Patient Name: Date of Service: [name], [name] 10/06/2021 3:15 PM Medical Record Number: [id number] Patient Account Number: [id number] Date of Birth/Sex: Treating RN: 07-13-1945 (76 y.o. [name], [name] Primary Care Koua Deeg: Karle Plumber  Other Clinician: Referring Malaia Buchta: Treating Burwell Bethel/Extender: Sammuel Bailiff in Treatment: 5 Encounter Discharge Information Items Discharge Condition: Stable Ambulatory Status: Ambulatory Discharge Destination: Home Transportation: Private Auto Accompanied By: daughter Schedule Follow-up Appointment: Yes Clinical Summary of Care: Patient Declined Electronic Signature(s) Signed: 10/06/2021 3:41:21 PM By: Rhae Hammock RN Entered By: Rhae Hammock on 10/06/2021 15:25:59 -------------------------------------------------------------------------------- Lower Extremity Assessment Details Patient Name: Date of Service: [name], [name] 10/06/2021 3:15 PM Medical Record Number: [id number] Patient Account Number: [id number] Date of Birth/Sex: Treating RN: 1945-12-14 (76 y.o. [name], [name] Primary Care Dom Haverland: Karle Plumber Other Clinician: Referring Kellin Fifer: Treating Leslie Jester/Extender: Sammuel Bailiff in Treatment: 5 Edema Assessment Assessed: Shirlyn Goltz: Yes] Patrice Paradise: Yes] Edema: [Left: Yes] [Right: Yes] Calf Left: Right: Point of Measurement: 34 cm From Medial Instep 33.5 cm 32.6 cm Ankle Left: Right: Point of Measurement: 7 cm From Medial Instep 22 cm 19.5 cm Vascular Assessment Pulses: Dorsalis Pedis Palpable: [Left:Yes] [Right:Yes] Posterior Tibial Palpable: [Left:Yes] [Right:Yes] Electronic Signature(s) Signed: 10/06/2021 3:41:21 PM By: Rhae Hammock RN Entered By: Rhae Hammock on 10/06/2021 15:15:52 -------------------------------------------------------------------------------- [place] Details Patient Name: Date of Service: Celine Mans NKA FO NDZEYUF, [name] 10/06/2021 3:15 PM Medical Record Number: [id number] Patient Account Number: [id number] Date of Birth/Sex: Treating RN: 1946-02-06 (76 y.o. [name], [name] Primary Care Ewa Hipp: Karle Plumber Other Clinician: Referring Rai Sinagra: Treating Aleighna Wojtas/Extender: Sammuel Bailiff in Treatment: 5 Active Inactive Electronic Signature(s) Signed: 10/06/2021 3:41:21 PM By: Rhae Hammock RN Entered By: Rhae Hammock on 10/06/2021 15:24:21 -------------------------------------------------------------------------------- Pain Assessment Details Patient Name: Date of Service: Celine Mans NKA FO NDZEYUF, [name] 10/06/2021 3:15 PM Medical Record Number: [id number] Patient Account Number: [id number] Date of Birth/Sex: Treating RN: Nov 29, 1945 (76 y.o. [name], [name] Primary Care Danielys Madry: Karle Plumber Other Clinician: Referring Alegandro Macnaughton: Treating Lanier Millon/Extender: He[place]  Matilde Sprang, Pecola Leisure in Treatment: 5 Active Problems Location of Pain Severity and Description of Pain Patient Has Paino No Site Locations Pain Management and Medication Current Pain Management: Electronic Signature(s) Signed: 10/06/2021 3:41:21 PM By: Rhae Hammock RN Entered By: Rhae Hammock on 10/06/2021 15:15:43 -------------------------------------------------------------------------------- Patient/Caregiver Education Details Patient Name: Date of Service: A SEGHA NKA Mickel Fuchs, [name] 8/22/2023andnbsp3:15 PM Medical Record Number: [id number] Patient Account Number: [id number] Date of Birth/Gender: Treating RN: 12-05-1945 (76 y.o. [name], [name] Primary Care Physician: Karle Plumber Other Clinician: Referring Physician: Treating Physician/Extender: Sammuel Bailiff in Treatment: 5 Education Assessment Education Provided To: Patient Education Topics Provided Wound/Skin Impairment: Methods: Explain/Verbal Responses: State content correctly Electronic Signature(s) Signed: 10/06/2021 3:41:21 PM By: Rhae Hammock RN Entered By: Rhae Hammock on 10/06/2021  15:24:36 -------------------------------------------------------------------------------- Wound Assessment Details Patient Name: Date of Service: [name], [name] 10/06/2021 3:15 PM Medical Record Number: [id number] Patient Account Number: [id number] Date of Birth/Sex: Treating RN: [birth year] (75 y.o. [name], [name] Primary Care Couper Juncaj: Karle Plumber Other Clinician: Referring Victorino Fatzinger: Treating Jyren Cerasoli/Extender: Sammuel Bailiff in Treatment: 5 Wound Status Wound Number: 1 Primary Atypical Etiology: Wound Location: Left, Circumferential Foot Wound Status: Open Wounding Event: Blister Comorbid Glaucoma, Hypertension, Hepatitis C, Type II Diabetes, Date Acquired: 07/16/2021 History: Neuropathy Weeks Of Treatment: 5 Clustered Wound: Yes Photos Wound Measurements Length: (cm) Width: (cm) Depth: (cm) Clustered Quantity: Area: (cm) Volume: (cm) 0 % Reduction in Area: 100% 0 % Reduction in Volume: 100% 0 Epithelialization: Large (67-100%) 15 0 0 Wound Description Classification: Full Thickness With Exposed Support Structures Wound Margin: Distinct, outline attached Exudate Amount: Large Exudate Type: Purulent Exudate Color: yellow, brown, green Foul Odor After Cleansing: Yes Due to Product Use: No Slough/Fibrino Yes Wound Bed Granulation Amount: Small (1-33%) Exposed Structure Granulation Quality: Red, Pink Fascia Exposed: No Necrotic Amount: Large (67-100%) Fat Layer (Subcutaneous Tissue) Exposed: Yes Tendon Exposed: No Muscle Exposed: No Joint Exposed: No Bone Exposed: No Electronic Signature(s) Signed: 10/06/2021 3:41:21 PM By: Rhae Hammock RN Entered By: Rhae Hammock on 10/06/2021 15:19:53 -------------------------------------------------------------------------------- Wound Assessment Details Patient Name: Date of Service: [name], [name] 10/06/2021 3:15 PM Medical Record  Number: [id number] Patient Account Number: [id number] Date of Birth/Sex: Treating RN: March 16, 1945 (76 y.o. [name], [name] Primary Care Lysle Yero: Karle Plumber Other Clinician: Referring Jaquese Irving: Treating Mikhayla Phillis/Extender: Sammuel Bailiff in Treatment: 5 Wound Status Wound Number: 2 Primary Atypical Etiology: Wound Location: Right, Circumferential Foot Wound Status: Open Wounding Event: Blister Comorbid Glaucoma, Hypertension, Hepatitis C, Type II Diabetes, Date Acquired: 07/16/2021 History: Neuropathy Weeks Of Treatment: 5 Clustered Wound: Yes Photos Wound Measurements Length: (cm) Width: (cm) Depth: (cm) Clustered Quantity: Area: (cm) Volume: (cm) 0 % Reduction in Area: 100% 0 % Reduction in Volume: 100% 0 Epithelialization: None 15 0 0 Wound Description Classification: Full Thickness With Exposed Support Structures Wound Margin: Distinct, outline attached Exudate Amount: Large Exudate Type: Purulent Exudate Color: yellow, brown, green Foul Odor After Cleansing: Yes Due to Product Use: No Slough/Fibrino Yes Wound Bed Granulation Amount: Small (1-33%) Exposed Structure Granulation Quality: Red, Pink Fascia Exposed: No Necrotic Amount: Large (67-100%) Fat Layer (Subcutaneous Tissue) Exposed: Yes Necrotic Quality: Eschar Tendon Exposed: No Muscle Exposed: No Joint Exposed: No Bone Exposed: No Electronic Signature(s) Signed: 10/06/2021 3:41:21 PM By: Rhae Hammock RN Entered By: Rhae Hammock on 10/06/2021 15:19:53 -------------------------------------------------------------------------------- Vitals Details Patient Name: Date of Service: [name], [place]  RIA NA 10/06/2021 3:15 PM Medical Record Number: [id number] Patient Account Number: [id number] Date of Birth/Sex: Treating RN: 14-Feb-1946 (76 y.o. [name], [name] Primary Care Lawayne Hartig: Karle Plumber Other Clinician: Referring Datrell Dunton: Treating  Anmarie Fukushima/Extender: Sammuel Bailiff in Treatment: 5 Vital Signs Time Taken: 15:15 Temperature (F): 98.7 Height (in): 64 Pulse (bpm): 88 Weight (lbs): 150 Respiratory Rate (breaths/min): 17 Body Mass Index (BMI): 25.7 Blood Pressure (mmHg): 153/77 Capillary Blood Glucose (mg/dl): 125 Reference Range: 80 - 120 mg / dl Electronic Signature(s) Signed: 10/06/2021 3:41:21 PM By: Rhae Hammock RN Entered By: Rhae Hammock on 10/06/2021 15:16:12

## 2021-10-07 ENCOUNTER — Telehealth: Payer: Self-pay

## 2021-10-07 NOTE — Telephone Encounter (Signed)
[facility] Patient Advocate Encounter  Patient's medications have been couriered to [facility] from [facility] and will be picked up 10/14/21.  5th Epclusa box  Ileene Patrick , [place] Patient [facility] Phone: [phone] Fax:  [phone]

## 2021-10-08 NOTE — Progress Notes (Signed)
ODA, PLACKE ([id number]) Visit Report for 10/06/2021 Chief Complaint Document Details Patient Name: Date of Service: A SEGHA NKA Mickel Fuchs, [name] 10/06/2021 3:15 PM Medical Record Number: [id number] Patient Account Number: [id number] Date of Birth/Sex: Treating RN: 09-Jun-1945 (76 y.o. [name], [name] Primary Care Provider: Karle Plumber Other Clinician: Referring Provider: Treating Provider/Extender: Sammuel Bailiff in Treatment: 5 Information Obtained from: Patient Chief Complaint 08/27/2021; multiple open wounds to the feet bilaterally Electronic Signature(s) Signed: 10/06/2021 3:51:23 PM By: Kalman Shan DO Entered By: Kalman Shan on 10/06/2021 15:46:29 -------------------------------------------------------------------------------- HPI Details Patient Name: Date of Service: Arby Barrette, [name] 10/06/2021 3:15 PM Medical Record Number: [id number] Patient Account Number: [id number] Date of Birth/Sex: Treating RN: 1945/09/16 (76 y.o. Benjaman Lobe Primary Care Provider: Karle Plumber Other Clinician: Referring Provider: Treating Provider/Extender: Sammuel Bailiff in Treatment: 5 History of Present Illness HPI Description: 08/27/2021 Ms. 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 Patient states that the rash started on her right foot on 07/19/2021. 2 days prior to this she was started on Epclusa for treatment of hep C. She was originally treated for potential tinea pedis with Lamisil however this did not improve symptoms. She developed worsening wounds and rash and was started on prednisone 40 mg daily by her primary care physician.  Patient reports improvement in symptoms. She is going to see dermatology on 09/09/2021. She is using betamethasone cream to the rash. She had blood work done by her PCP that included CCP, sed rate, ANA and RF. All results within normal limits. Patient currently denies signs of infection. Patient states that infectious disease does not think this is caused by the [place]. 7/28; patient presents for follow-up. Daughter is present during the encounter. Patient has been using Dakin's wet-to-dry dressings to the wound beds. She is being tapered off of prednisone and is currently on 30 mg daily. Her wound culture grew Pseudomonas and MRSA. She states she took an antibiotic for but cannot remember the name. She completed this course. Her biopsy showed lichenoid dermatitis. She states that dermatology started her on triamcinolone cream and Silvadene. She reports significant improvement in wound healing. 8/22; patient presents for follow-up. Patient has stopped using Dakin's wet-to-dry dressings as her wounds have healed. She is being tapered off prednisone by her primary care physician and is now on 15 mg daily. Electronic Signature(s) Signed: 10/06/2021 3:51:23 PM By: Kalman Shan DO Entered By: Kalman Shan on 10/06/2021 15:47:48 -------------------------------------------------------------------------------- Physical Exam Details Patient Name: Date of Service: Celine Mans NKA FO NDZEYUF, [name] 10/06/2021 3:15 PM Medical Record Number: [id number] Patient Account Number: [id number] Date of Birth/Sex: Treating RN: [birth year] (76 y.o. [name], [name] Primary Care Provider: Karle Plumber Other Clinician: Referring Provider: Treating Provider/Extender: Sammuel Bailiff in Treatment: 5 Constitutional respirations regular, non-labored and within target range for patient.. Cardiovascular 2+ dorsalis pedis/posterior tibialis pulses. Psychiatric pleasant and  cooperative. Notes Epithelization to previous wound sites on her right and left lower extremities. No drainage noted. No open wounds. Electronic Signature(s) Signed: 10/06/2021 3:51:23 PM By: Kalman Shan DO Entered By: Kalman Shan on 10/06/2021 15:48:23 -------------------------------------------------------------------------------- Physician Orders Details Patient Name: Date of Service: [name], [name] 10/06/2021  3:15 PM Medical Record Number: [id number] Patient Account Number: [id number] Date of Birth/Sex: Treating RN: 1945-08-23 (76 y.o. [name], [name] Primary Care Provider: Karle Plumber Other Clinician: Referring Provider: Treating Provider/Extender: Sammuel Bailiff in Treatment: 5 Verbal / Phone Orders: No Diagnosis Coding Discharge From [facility] Services Discharge from [place] Signature(s) Signed: 10/06/2021 3:51:23 PM By: Kalman Shan DO Previous Signature: 10/06/2021 3:41:21 PM Version By: Rhae Hammock RN Entered By: Kalman Shan on 10/06/2021 15:48:32 -------------------------------------------------------------------------------- Problem List Details Patient Name: Date of Service: Celine Mans NKA FO NDZEYUF, [name] 10/06/2021 3:15 PM Medical Record Number: [id number] Patient Account Number: [id number] Date of Birth/Sex: Treating RN: March 10, 1945 (76 y.o. [name], [name] Primary Care Provider: Karle Plumber Other Clinician: Referring Provider: Treating Provider/Extender: Sammuel Bailiff in Treatment: 5 Active Problems ICD-10 Encounter Code Description Active Date MDM Diagnosis [phone] Non-pressure chronic ulcer of other part of right foot with fat layer exposed 08/27/2021 No Yes L97.522 Non-pressure chronic ulcer of other part of left foot with fat layer exposed 08/27/2021 No Yes 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 08/27/2021 No Yes C22.0 Liver cell carcinoma  08/27/2021 No Yes E11.628 Type 2 diabetes mellitus with other skin complications 08/27/4578 No Yes E11.621 Type 2 diabetes mellitus with foot ulcer 08/27/2021 No Yes Inactive Problems Resolved Problems Electronic Signature(s) Signed: 10/06/2021 3:51:23 PM By: Kalman Shan DO Entered By: Kalman Shan on 10/06/2021 15:46:09 -------------------------------------------------------------------------------- Progress Note Details Patient Name: Date of Service: Celine Mans NKA FO NDZEYUF, [name] 10/06/2021 3:15 PM Medical Record Number: [id number] Patient Account Number: [id number] Date of Birth/Sex: Treating RN: 06-11-1945 (76 y.o. [name], [name] Primary Care Provider: Karle Plumber Other Clinician: Referring Provider: Treating Provider/Extender: Sammuel Bailiff in Treatment: 5 Subjective Chief Complaint Information obtained from Patient 08/27/2021; multiple open wounds to the feet bilaterally History of Present Illness (HPI) 08/27/2021 Ms. 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 Patient states that the rash started on her right foot on 07/19/2021. 2 days prior to this she was started on Epclusa for treatment of hep C. She was originally treated for potential tinea pedis with Lamisil however this did not improve symptoms. She developed worsening wounds and rash and was started on prednisone 40 mg daily by her primary care physician. Patient reports improvement in symptoms. She is going to see dermatology on 09/09/2021. She is using betamethasone cream to the rash. She had blood work done by her PCP that included CCP, sed rate, ANA and RF. All results within normal limits. Patient currently denies signs of  infection. Patient states that infectious disease does not think this is caused by the [place]. 7/28; patient presents for follow-up. Daughter is present during the encounter. Patient has been using Dakin's wet-to-dry dressings to the wound beds. She is being tapered off of prednisone and is currently on 30 mg daily. Her wound culture grew Pseudomonas and MRSA. She states she took an antibiotic for but cannot remember the name. She completed this course. Her biopsy showed lichenoid dermatitis. She states that dermatology started her on triamcinolone cream and Silvadene. She reports significant improvement in wound healing. 8/22; patient presents for follow-up. Patient has stopped using Dakin's wet-to-dry dressings as her wounds have healed. She is being tapered off  prednisone by her primary care physician and is now on 15 mg daily. Patient History Information obtained from Patient, Chart. Family History Unknown History. Social History Never smoker, Marital Status - Widowed, Alcohol Use - Never, Drug Use - No History, Caffeine Use - Rarely. Medical History Eyes Patient has history of Glaucoma Cardiovascular Patient has history of Hypertension Gastrointestinal Patient has history of Hepatitis C Endocrine Patient has history of Type II Diabetes Neurologic Patient has history of Neuropathy Medical A Surgical History Notes nd Immunological on immunotherapy for the hepatitis carcinoma x 1 year; on epclusa for the hep c x 1 month Oncologic hepatocarcinoma Objective Constitutional respirations regular, non-labored and within target range for patient.. Vitals Time Taken: 3:15 PM, Height: 64 in, Weight: 150 lbs, BMI: 25.7, Temperature: 98.7 F, Pulse: 88 bpm, Respiratory Rate: 17 breaths/min, Blood Pressure: 153/77 mmHg, Capillary Blood Glucose: 125 mg/dl. Cardiovascular 2+ dorsalis pedis/posterior tibialis pulses. Psychiatric pleasant and cooperative. General Notes: Epithelization to  previous wound sites on her right and left lower extremities. No drainage noted. No open wounds. Integumentary (Hair, Skin) Wound #1 status is Open. Original cause of wound was Blister. The date acquired was: 07/16/2021. The wound has been in treatment 5 weeks. The wound is located on the Left,Circumferential Foot. The wound measures 0cm length x 0cm width x 0cm depth; 0cm^2 area and 0cm^3 volume. There is Fat Layer (Subcutaneous Tissue) exposed. There is a large amount of purulent drainage noted. Foul odor after cleansing was noted. The wound margin is distinct with the outline attached to the wound base. There is small (1-33%) red, pink granulation within the wound bed. There is a large (67-100%) amount of necrotic tissue within the wound bed. Wound #2 status is Open. Original cause of wound was Blister. The date acquired was: 07/16/2021. The wound has been in treatment 5 weeks. The wound is located on the Right,Circumferential Foot. The wound measures 0cm length x 0cm width x 0cm depth; 0cm^2 area and 0cm^3 volume. There is Fat Layer (Subcutaneous Tissue) exposed. There is a large amount of purulent drainage noted. Foul odor after cleansing was noted. The wound margin is distinct with the outline attached to the wound base. There is small (1-33%) red, pink granulation within the wound bed. There is a large (67-100%) amount of necrotic tissue within the wound bed including Eschar. Assessment Active Problems ICD-10 Non-pressure chronic ulcer of other part of right foot with fat layer exposed Non-pressure chronic ulcer of other part of left foot with fat layer exposed Chronic viral hepatitis C Liver cell carcinoma Type 2 diabetes mellitus with other skin complications Type 2 diabetes mellitus with foot ulcer Patient has had significant improvement in her wound healing with the use of Dakin's and prednisone. Her wounds have healed and she is currently being tapered off of prednisone by her primary  care physician. At this time I recommended following up as needed. Plan Discharge From [facility] Services: Discharge from [place] 1. Discharge from clinic due to closed wound 2. Follow-up as needed Electronic Signature(s) Signed: 10/06/2021 3:51:23 PM By: Kalman Shan DO Entered By: Kalman Shan on 10/06/2021 15:49:08 -------------------------------------------------------------------------------- HxROS Details Patient Name: Date of Service: Celine Mans NKA FO NDZEYUF, [name] 10/06/2021 3:15 PM Medical Record Number: [id number] Patient Account Number: [id number] Date of Birth/Sex: Treating RN: 1945-11-03 (76 y.o. [name], [name] Primary Care Provider: Karle Plumber Other Clinician: Referring Provider: Treating Provider/Extender: Sammuel Bailiff in Treatment: 5 Information Obtained From Patient Chart Eyes Medical History:  Positive for: Glaucoma Cardiovascular Medical History: Positive for: Hypertension Gastrointestinal Medical History: Positive for: Hepatitis C Endocrine Medical History: Positive for: Type II Diabetes Treated with: Insulin, Oral agents Blood sugar tested every day: Yes Tested : Blood sugar testing results: Breakfast: 90; Dinner: 150 Immunological Medical History: Past Medical History Notes: on immunotherapy for the hepatitis carcinoma x 1 year; on epclusa for the hep c x 1 month Neurologic Medical History: Positive for: Neuropathy Oncologic Medical History: Past Medical History Notes: hepatocarcinoma HBO Extended History Items Eyes: Glaucoma Immunizations Pneumococcal Vaccine: Received Pneumococcal Vaccination: Yes Received Pneumococcal Vaccination On or After 60th Birthday: Yes Implantable Devices Yes Family and Social History Unknown History: Yes; Never smoker; Marital Status - Widowed; Alcohol Use: Never; Drug Use: No History; Caffeine Use: Rarely; Financial Concerns: No; Food, Clothing or  Shelter Needs: No; Support System Lacking: No; Transportation Concerns: No Electronic Signature(s) Signed: 10/06/2021 3:51:23 PM By: Kalman Shan DO Signed: 10/08/2021 3:58:21 PM By: Rhae Hammock RN Entered By: Kalman Shan on 10/06/2021 15:47:55 -------------------------------------------------------------------------------- SuperBill Details Patient Name: Date of Service: Arby Barrette, [name] 10/06/2021 Medical Record Number: [id number] Patient Account Number: [id number] Date of Birth/Sex: Treating RN: [birth year] (76 y.o. [name], [name] Primary Care Provider: Karle Plumber Other Clinician: Referring Provider: Treating Provider/Extender: Sammuel Bailiff in Treatment: 5 Diagnosis Coding ICD-10 Codes Code Description [phone] Non-pressure chronic ulcer of other part of right foot with fat layer exposed L97.522 Non-pressure chronic ulcer of other part of left foot with fat layer exposed 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 C22.0 Liver cell carcinoma E11.628 Type 2 diabetes mellitus with other skin complications B71.696 Type 2 diabetes mellitus with foot ulcer Facility Procedures CPT4 Code: [id number] Description: 75102 - WOUND CARE VISIT-LEV 3 EST PT Modifier: Quantity: 1 Physician Procedures Electronic Signature(s) Signed: 10/06/2021 3:51:23 PM By: Kalman Shan DO Previous Signature: 10/06/2021 3:41:21 PM Version By: Rhae Hammock RN Entered By: Kalman Shan on 10/06/2021 15:50:10

## 2021-10-14 ENCOUNTER — Other Ambulatory Visit: Payer: Self-pay

## 2021-10-14 ENCOUNTER — Encounter: Payer: Self-pay | Admitting: Hematology & Oncology

## 2021-10-15 ENCOUNTER — Other Ambulatory Visit: Payer: Self-pay

## 2021-10-15 ENCOUNTER — Encounter: Payer: Self-pay | Admitting: Hematology & Oncology

## 2021-10-15 ENCOUNTER — Encounter: Payer: Self-pay | Admitting: Internal Medicine

## 2021-10-15 ENCOUNTER — Ambulatory Visit (INDEPENDENT_AMBULATORY_CARE_PROVIDER_SITE_OTHER): Payer: Self-pay | Admitting: Internal Medicine

## 2021-10-15 VITALS — BP 150/74 | HR 80 | Temp 97.8°F | Ht 64.0 in | Wt 156.0 lb

## 2021-10-15 DIAGNOSIS — R21 Rash and other nonspecific skin eruption: Secondary | ICD-10-CM

## 2021-10-15 DIAGNOSIS — B182 Chronic viral hepatitis C: Secondary | ICD-10-CM

## 2021-10-15 DIAGNOSIS — C220 Liver cell carcinoma: Secondary | ICD-10-CM

## 2021-10-15 NOTE — Patient Instructions (Signed)
Follow up with me around 8 weeks  Continue epclusa  Discuss with dermatology/pcp on prednisone titration/glucose control  Watch for new rash now nivolumab restarted

## 2021-10-15 NOTE — Progress Notes (Signed)
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[place]) 09/22/2020   Goals of care, counseling/discussion 09/01/2020   Hepatocellular carcinoma ([place]) 08/14/2020   Hyperglycemia 08/10/2020   Pneumoperitoneum 08/09/2020   History of colorectal cancer 08/09/2020   Liver mass, left lobe 08/09/2020   Insulin-requiring or dependent type II diabetes mellitus ([place]) 08/09/2020   Hypertension associated with diabetes ([place]) 08/09/2020   Constipation, chronic 08/09/2020   Perforated gastric ulcer s/p lap omental Phillip Heal patch 08/09/2020 08/09/2020      Subjective:    Patient ID: [name] [name], female    DOB: 09/20/1945, 76 y.o.   MRN: [id number]  No chief complaint on file.    HPI:  [name] [name] is a 76 y.o. female with hcc/chronic hep c, here for evaluation for hep c treatment  She is known to our clinic. When she was first evaluated a year ago, decision to treat hep c was deferred given her active bulky Felida  She has been undergoing hcc treatment with oncology via immune therapy and her lesion has improved significantly on 02/2021 mra. There is a plan to repeat mra in 05/2021  She is feeling well. No fatigue, ascites, blood/melena per rectum, joint pain, skin rash  She has mild-moderate lft elevation that is stable  Of note, the mra did  mention sign of cirrhosis and portal hypertension with trace ascites She has egd 3/09 which saw gastritis but no varices. Colonoscopy same day also no varices except sigmoid diverticulosis  She never needed paracentesis or uses diuretics for hx of ascites. No hx gib -------------------- 06/17/21 id f/u Checkpoint inhibitors hold due to lft up -- assymptomatic Feels well/baseline Dr Marin Olp wants to treat her for hep c -- recent repeat mri stable/improved but still evidence  of hcc    08/20/21 id f/u She has started epclusa 6 months planned About 4-5 weeks into epclusa developed a burning/itch rash started on feet then spread centrally Started with a bump then enlarge into vessicle/blister/papule and then can coalesce and form large blisters No fever, chill No arthralgia No myalgia No headache No visual change No hematuria No abd pain  She was given abx for cellulitis without help She was given topical steroid for thought of dishydrosis but didn't help  Where the lesions coalesced she has pain and keeping her awake   8/31 id clinic Patient's rash biopsied and seen by derm. Rash lichenoid dermatosis. Nivolumab was stopped/steroid started with significant improvement/resolution. She is very happy  She had been continuing epclusa without problem She is on pred 10 mg at this time daily  Oncology had just restarted nivolumab  Tolerating epclusa. Finishing 4th month  Recent labs from oncology indicate normal lft  Allergies  Allergen Reactions   Bactrim [Sulfamethoxazole-Trimethoprim] Swelling   Clarithromycin Swelling and Rash    Daughter is unsure if Clarithromycin or Amoxicillin caused rash and swelling.      Outpatient Medications Prior to Visit  Medication Sig Dispense Refill   betamethasone dipropionate 0.05 % cream Apply topically 2 (two) times daily. 30 g 0   brimonidine (ALPHAGAN) 0.2 % ophthalmic solution 1 drop 2 (two) times daily.     gabapentin (NEURONTIN) 300 MG capsule Take 1 capsule (300 mg total) by mouth once nightly at bedtime. 30 capsule 3   glucose blood (TRUE METRIX BLOOD GLUCOSE TEST)  test strip Use as instructed 100 each 12   insulin isophane & regular human KwikPen (HUMULIN 70/30 KWIKPEN) (70-30) 100 UNIT/ML KwikPen INJECT 30-40 units subcutANEOUSLY EVERY MORNING and 5-12 units EVERY EVENING 15 mL 11   insulin lispro (HUMALOG) 100 UNIT/ML injection SSI with meals: 200-250 3 units, 251-300 6 units, 301-350 9 units, 351-400  12 units, 401-450 15 units, > 451 18 units under the skin as directed 10 mL 2   insulin NPH-regular Human (70-30) 100 UNIT/ML injection Inject 30-40 units into the skin once every morning and 5-12 units once every evening. 10 mL 0   Insulin Pen Needle 31G X 6 MM MISC use as directed 100 each 6   linaclotide (LINZESS) 145 MCG CAPS capsule Take 1 capsule (145 mcg total) by mouth once daily before breakfast. (Office visit for further refills) (Patient not taking: Reported on 09/25/2021) 30 capsule 2   losartan (COZAAR) 50 MG tablet Take 1 tablet (50 mg total) by mouth at bedtime. 30 tablet 6   mupirocin ointment (BACTROBAN) 2 % Apply once a day to biopsy portion of left foot 22 g 2   Na Sulfate-K Sulfate-Mg Sulf 17.5-3.13-1.6 GM/177ML SOLN Use as directed (Patient not taking: Reported on 09/25/2021) 354 mL 0   NIFEdipine (PROCARDIA-XL/NIFEDICAL-XL) 30 MG 24 hr tablet Take 1 tablet (30 mg total) by mouth in the morning and at bedtime. MUST HAVE OFFICE VISIT FOR REFILLS 180 tablet 0   pantoprazole (PROTONIX) 40 MG tablet Take 1 tablet (40 mg total) by mouth 2 (two) times daily. (Patient taking differently: Take 40 mg by mouth daily.) 180 tablet 3   polyethylene glycol powder (GLYCOLAX/MIRALAX) 17 GM/SCOOP powder Take 17 g by mouth daily as needed.     predniSONE (DELTASONE) 10 MG tablet TAKE 2 tabs BY MOUTH x 4 days then 1 AND 1/2 tabs  x 2 wks then 1 tab  x 2 wk 43 tablet 0   silver sulfADIAZINE (SILVADENE) 1 % cream Apply to the open, ulcerated areas of the skin once daily as needed. 400 g 1   sodium hypochlorite (DAKIN'S 1/4 STRENGTH) 0.125 % SOLN moisten gauze for wet to dry dressings (Patient not taking: Reported on 09/25/2021) 473 mL 3   Sofosbuvir-Velpatasvir (EPCLUSA) 400-100 MG TABS Take 1 tablet by mouth daily. 30 tablet 2   traMADol (ULTRAM) 50 MG tablet Take 1 tablet (50 mg total) by mouth every 8 (eight) hours as needed. (Patient not taking: Reported on 09/25/2021) 60 tablet 0   triamcinolone  cream (KENALOG) 0.1 % Apply to the itchy and irritated areas of the skin of the back, forearms, hands, legs and feet daily as needed (Patient not taking: Reported on 09/25/2021) 454 g 1   Facility-Administered Medications Prior to Visit  Medication Dose Route Frequency Provider Last Rate Last Admin   sodium chloride flush (NS) 0.9 % injection 10 mL  10 mL Intravenous PRN Celso Amy, NP   10 mL at 12/18/20 1009     Social History   Socioeconomic History   Marital status: Widowed    Spouse name: Not on file   Number of children: 5   Years of education: 14   Highest education level: Associate degree: occupational, Hotel manager, or vocational program  Occupational History   Not on file  Tobacco Use   Smoking status: Never   Smokeless tobacco: Never  Vaping Use   Vaping Use: Never used  Substance and Sexual Activity   Alcohol use: Not Currently   Drug use:  Never   Sexual activity: Not Currently  Other Topics Concern   Not on file  Social History Narrative   Originally from [place].  Speaks [place] and [place]   Social Determinants of Radio broadcast assistant Strain: Not on [employer] Insecurity: Not on file  Transportation Needs: Not on file  Physical Activity: Not on file  Stress: Not on file  Social Connections: Not on file  Intimate Partner Violence: Not on file      Review of Systems    All other ros negative  Objective:    There were no vitals taken for this visit. Nursing note and vital signs reviewed.  Physical Exam     General/constitutional: no distress, pleasant HEENT: Normocephalic, PER, Conj Clear, EOMI, Oropharynx clear Neck supple CV: rrr no mrg Lungs: clear to auscultation, normal respiratory effort Abd: Soft, Nontender Ext: no edema Skin: hyperpigmentated chagnes from prior rash. No further skin ulcer/opening               Labs: Lab Results  Component Value Date   WBC 7.8 09/25/2021   HGB 13.3 09/25/2021   HCT 39.8  09/25/2021   MCV 90.9 09/25/2021   PLT 176 75/11/2583   Last metabolic panel Lab Results  Component Value Date   GLUCOSE 333 (H) 09/25/2021   NA 132 (L) 09/25/2021   K 3.9 09/25/2021   CL 101 09/25/2021   CO2 24 09/25/2021   BUN 23 09/25/2021   CREATININE 0.78 09/25/2021   GFRNONAA >60 09/25/2021   CALCIUM 10.4 (H) 09/25/2021   PHOS 2.5 08/10/2020   PHOS 2.5 08/10/2020   PROT 6.3 (L) 09/25/2021   ALBUMIN 3.9 09/25/2021   BILITOT 0.5 09/25/2021   ALKPHOS 114 09/25/2021   AST 15 09/25/2021   ALT 23 09/25/2021   ANIONGAP 7 09/25/2021   No results found for: "INR", "PROTIME"   Micro:  Serology:  Imaging: 05/30/21 mri liver 1. Continued interval reduction in size of a dominant mass of hepatic segment III as well as additional smaller masses, consistent continued treatment response of biopsy proven hepatocellular carcinoma. 2. Unchanged subcentimeter arterially hyperenhancing focus of the liver dome, although distinct in enhancement characteristics from other multifocal hepatocellular carcinoma lesions, of intermediate suspicion for hepatocellular carcinoma. This is a LI-RADS category 3 lesion if so characterized. Attention on follow-up. 3. No new suspicious lesions identified. 4. Trace ascites. 5. Cholelithiasis.   02/19/21 mr abdomen 1. Marked interval response to therapy with diminished size of all visible lesions, near complete resolution of most lesions aside from the largest lesion which likely a small amount of residual viable tumor. 2. No new lesions. 3. Signs of cirrhosis and portal hypertension. 4. Cholelithiasis. 5. Diminished ascites with only trace ascites and with resolution of third spacing that was seen previously.    Assessment & Plan:   Problem List Items Addressed This Visit       Digestive   Hepatocellular carcinoma ([place])   Chronic hepatitis C with hepatic coma ([place])   Other Visit Diagnoses     Skin rash    -  Primary       Reviewed natural hx of chronic hep c Reviewed treatment benefit and treatment initiation/timing in special cases like her who has high burden nonresectable hcc  Currently while there is potentially association with hep c and hcc carcinogenesis, it is unclear if not treating hep c will decrease chance of cure of hcc. The reverse seems to be true (with active hcc, the  rate of cure of hep c with DAA is much lower). As she only has 2 shots at DAA I would like to see her at least stable from [facility] standpoint in terms of cure for 3-6 months before initiating DAA  I agree that treating hep C successfully will reduce the [facility] recurrent rate and also improve all-cause mortality for her   --------------------- 06/17/21 assessment Patient from malignancy standpoint stable but evidence of disease I agree with oncology regarding the recent uptick in lft likely related to checkpoint inhibitor  Will need hcv genotype/rna quant for insurance authorization  Given cirhosis/decompensation (ascites) will plan 24 weeks of epclusa. She is unlikely a good candidate for interferon co-treatment  She needs to see ongoing GI follow up for routine variceal monitoring/ablation as needed   -labs as discussed -refer to pharmacy to discuss epclusa treamtent -- 24 weeks plan -see me again no earlier than 3 months after the 24 weeks of epclusa done. -no need for elastography   -discussed natural progression of hep c, transmission (avoid sharing personal hygiene equipment) -discussed avoid toxin like etoh and excessive acetamaminphen (no more than 2 gram a day) -discussed healthy life style and good glucose control -discussed avoiding eating raw sea food -discussed we can treat hep c but can be reinfected -discussed hepatitis coinfection and vaccination  ------------- 08/20/21 assessment Rash suspect cryo vs lcv vasculitis like rash Doesn't appear to be cutaneous porphyria I do not think this is epclusa drug  rash  Will check rpr/syphilis and gc/chlam (keratoderma blenorrhagicum)  Continue epclusa Refer to dermatology dr Orlene Och Stop abx/topical steroid Cryoglobulin Anca  Ibuprofen as needed for pain  ----------- 8/31 assessment Rash resolved on prednisone and off nivolumab (which was just restarted by oncology) Explained to her again the dermatitis of nivolumab can occur any time while on nivolumab if that was cause Doesn't appear rash was due to epclusa   She has 2 and a half month left of epclusa for 6 months tx in setting decompensated cirrhosis  Labs done recently looks good   See me 8 weeks and then again in 05/2022 for test of cure testing  I have spent a total of 20 minutes of face-to-face and non-face-to-face time, excluding clinical staff time, preparing to see patient, ordering tests and/or medications, and provide counseling the patient   Follow-up: Return in about 8 weeks (around 12/10/2021).      Jabier Mutton, [facility] [phone]  pager   [phone] cell 10/15/2021, 11:20 AM

## 2021-10-16 ENCOUNTER — Encounter: Payer: Self-pay | Admitting: Gastroenterology

## 2021-10-19 ENCOUNTER — Other Ambulatory Visit: Payer: Self-pay | Admitting: Family

## 2021-10-20 NOTE — Telephone Encounter (Signed)
Please advise on refill. Okay to send to PCP?

## 2021-10-20 NOTE — Telephone Encounter (Signed)
Lets send to pcp. She has been involved a lot with the rash management. I dont want to step on her foot  Thanks

## 2021-10-23 ENCOUNTER — Inpatient Hospital Stay: Payer: Self-pay

## 2021-10-23 ENCOUNTER — Encounter: Payer: Self-pay | Admitting: Family

## 2021-10-23 ENCOUNTER — Telehealth: Payer: Self-pay | Admitting: *Deleted

## 2021-10-23 ENCOUNTER — Inpatient Hospital Stay (HOSPITAL_BASED_OUTPATIENT_CLINIC_OR_DEPARTMENT_OTHER): Payer: Self-pay | Admitting: Family

## 2021-10-23 ENCOUNTER — Inpatient Hospital Stay: Payer: Self-pay | Attending: Hematology & Oncology

## 2021-10-23 VITALS — BP 150/75 | HR 77 | Temp 98.2°F | Resp 18 | Wt 154.0 lb

## 2021-10-23 DIAGNOSIS — C220 Liver cell carcinoma: Secondary | ICD-10-CM

## 2021-10-23 DIAGNOSIS — G620 Drug-induced polyneuropathy: Secondary | ICD-10-CM | POA: Insufficient documentation

## 2021-10-23 DIAGNOSIS — Z5112 Encounter for antineoplastic immunotherapy: Secondary | ICD-10-CM | POA: Insufficient documentation

## 2021-10-23 DIAGNOSIS — B1920 Unspecified viral hepatitis C without hepatic coma: Secondary | ICD-10-CM | POA: Insufficient documentation

## 2021-10-23 DIAGNOSIS — E032 Hypothyroidism due to medicaments and other exogenous substances: Secondary | ICD-10-CM

## 2021-10-23 LAB — CBC WITH DIFFERENTIAL (CANCER CENTER ONLY)
Abs Immature Granulocytes: 0.02 10*3/uL (ref 0.00–0.07)
Basophils Absolute: 0 10*3/uL (ref 0.0–0.1)
Basophils Relative: 1 %
Eosinophils Absolute: 0 10*3/uL (ref 0.0–0.5)
Eosinophils Relative: 1 %
HCT: 40.8 % (ref 36.0–46.0)
Hemoglobin: 13.6 g/dL (ref 12.0–15.0)
Immature Granulocytes: 0 %
Lymphocytes Relative: 39 %
Lymphs Abs: 2.4 10*3/uL (ref 0.7–4.0)
MCH: 29.8 pg (ref 26.0–34.0)
MCHC: 33.3 g/dL (ref 30.0–36.0)
MCV: 89.5 fL (ref 80.0–100.0)
Monocytes Absolute: 0.5 10*3/uL (ref 0.1–1.0)
Monocytes Relative: 7 %
Neutro Abs: 3.3 10*3/uL (ref 1.7–7.7)
Neutrophils Relative %: 52 %
Platelet Count: 181 10*3/uL (ref 150–400)
RBC: 4.56 MIL/uL (ref 3.87–5.11)
RDW: 13.8 % (ref 11.5–15.5)
WBC Count: 6.2 10*3/uL (ref 4.0–10.5)
nRBC: 0 % (ref 0.0–0.2)

## 2021-10-23 LAB — CMP (CANCER CENTER ONLY)
ALT: 29 U/L (ref 0–44)
AST: 20 U/L (ref 15–41)
Albumin: 3.9 g/dL (ref 3.5–5.0)
Alkaline Phosphatase: 124 U/L (ref 38–126)
Anion gap: 5 (ref 5–15)
BUN: 13 mg/dL (ref 8–23)
CO2: 27 mmol/L (ref 22–32)
Calcium: 10.2 mg/dL (ref 8.9–10.3)
Chloride: 106 mmol/L (ref 98–111)
Creatinine: 0.65 mg/dL (ref 0.44–1.00)
GFR, Estimated: >60 mL/min (ref >60)
Glucose, Bld: 152 mg/dL — ABNORMAL HIGH (ref 70–99)
Potassium: 4 mmol/L (ref 3.5–5.1)
Sodium: 138 mmol/L (ref 135–145)
Total Bilirubin: 0.5 mg/dL (ref 0.3–1.2)
Total Protein: 6.8 g/dL (ref 6.5–8.1)

## 2021-10-23 LAB — LACTATE DEHYDROGENASE: LDH: 176 U/L (ref 98–192)

## 2021-10-23 MED ORDER — HEPARIN SOD (PORK) LOCK FLUSH 100 UNIT/ML IV SOLN
500.0000 [IU] | Freq: Once | INTRAVENOUS | Status: AC | PRN
  Administered 2021-10-23: 500 [IU]

## 2021-10-23 MED ORDER — SODIUM CHLORIDE 0.9 % IV SOLN: Freq: Once | INTRAVENOUS | Status: AC

## 2021-10-23 MED ORDER — SODIUM CHLORIDE 0.9 % IV SOLN
480.0000 mg | Freq: Once | INTRAVENOUS | Status: AC
  Administered 2021-10-23: 480 mg via INTRAVENOUS
  Filled 2021-10-23: qty 48

## 2021-10-23 MED ORDER — SODIUM CHLORIDE 0.9% FLUSH
10.0000 mL | INTRAVENOUS | Status: DC | PRN
  Administered 2021-10-23: 10 mL

## 2021-10-23 NOTE — Progress Notes (Signed)
Hematology and Oncology Follow Up Visit  Nur Rabold [id number] [birth year] 76 y.o. 10/23/2021   Principle Diagnosis:  Hepatocellular carcinoma-multifocal Hepatitis C   Current Therapy:        Status post cycle 1 of nivolumab/ipilimumab -- d/c on 10/13/2020 due to hepatic toxicity Nivolumab 480 mg IV every 4 weeks - s/p cycle 18 -- started 11/21/2020 --changed to 480 mg on 08/2021   Interim History:  Ms. Negin Hegg is here today with her daughter for follow-up.  She is doing quite well and has no new complaints at this time.  AFP last month was down to 6.3.  ID has tapered her steroid down to 10 mg per day this week. The rash on her legs has dried out completely. No open wounds. She has some hyper pigmentation on both lower legs.  No fever, chills, n/v, cough, rash, dizziness, SOB, chest pain, palpitations, abdominal pain or changes in bowel or bladder habits.  No blood loss noted. No bruising or petechiae.  No swelling in her extremities. Neuropathy in her hands and feet has improved.  No falls or syncope.  Appetite and hydration is good. Her weight is stable at 154 lbs.   ECOG Performance Status: 1 - Symptomatic but completely ambulatory  Medications:  Allergies as of 10/23/2021       Reactions   Bactrim [sulfamethoxazole-trimethoprim] Swelling   Clarithromycin Swelling, Rash   Daughter is unsure if Clarithromycin or Amoxicillin caused rash and swelling.        Medication List        Accurate as of October 23, 2021 10:26 AM. If you have any questions, ask your nurse or doctor.          betamethasone dipropionate 0.05 % cream Apply topically 2 (two) times daily.   brimonidine 0.2 % ophthalmic solution Commonly known as: ALPHAGAN 1 drop 2 (two) times daily.   gabapentin 300 MG capsule Commonly known as: NEURONTIN Take 1 capsule (300 mg total) by mouth once nightly at bedtime.   HumaLOG 100 UNIT/ML injection Generic drug: insulin  lispro SSI with meals: 200-250 3 units, 251-300 6 units, 301-350 9 units, 351-400 12 units, 401-450 15 units, > 451 18 units under the skin as directed   HumuLIN 70/30 KwikPen (70-30) 100 UNIT/ML KwikPen Generic drug: insulin isophane & regular human KwikPen INJECT 30-40 units subcutANEOUSLY EVERY MORNING and 5-12 units EVERY EVENING   HumuLIN 70/30 (70-30) 100 UNIT/ML injection Generic drug: insulin NPH-regular Human Inject 30-40 units into the skin once every morning and 5-12 units once every evening.   linaclotide 145 MCG Caps capsule Commonly known as: Linzess Take 1 capsule (145 mcg total) by mouth once daily before breakfast. (Office visit for further refills)   losartan 50 MG tablet Commonly known as: COZAAR Take 1 tablet (50 mg total) by mouth at bedtime.   mupirocin ointment 2 % Commonly known as: BACTROBAN Apply once a day to biopsy portion of left foot   NIFEdipine 30 MG 24 hr tablet Commonly known as: PROCARDIA-XL/NIFEDICAL-XL Take 1 tablet (30 mg total) by mouth in the morning and at bedtime. MUST HAVE OFFICE VISIT FOR REFILLS   pantoprazole 40 MG tablet Commonly known as: Protonix Take 1 tablet (40 mg total) by mouth 2 (two) times daily. What changed: when to take this   polyethylene glycol powder 17 GM/SCOOP powder Commonly known as: GLYCOLAX/MIRALAX Take 17 g by mouth daily as needed.   predniSONE 10 MG tablet Commonly known as: DELTASONE TAKE 2 tabs  BY MOUTH x 4 days then 1 AND 1/2 tabs  x 2 wks then 1 tab  x 2 wk   sodium hypochlorite 0.125 % Soln Commonly known as: DAKIN'S 1/4 STRENGTH moisten gauze for wet to dry dressings   Sofosbuvir-Velpatasvir 400-100 MG Tabs Commonly known as: Epclusa Take 1 tablet by mouth daily.   SSD 1 % cream Generic drug: silver sulfADIAZINE Apply to the open, ulcerated areas of the skin once daily as needed.   Suprep Bowel Prep Kit 17.5-3.13-1.6 GM/177ML Soln Generic drug: Na Sulfate-K Sulfate-Mg Sulf Use as  directed   traMADol 50 MG tablet Commonly known as: ULTRAM Take 1 tablet (50 mg total) by mouth every 8 (eight) hours as needed.   triamcinolone cream 0.1 % Commonly known as: KENALOG Apply to the itchy and irritated areas of the skin of the back, forearms, hands, legs and feet daily as needed   True Metrix Blood Glucose Test test strip Generic drug: glucose blood Use as instructed   TRUEplus 5-Bevel Pen Needles 31G X 6 MM Misc Generic drug: Insulin Pen Needle use as directed        Allergies:  Allergies  Allergen Reactions   Bactrim [Sulfamethoxazole-Trimethoprim] Swelling   Clarithromycin Swelling and Rash    Daughter is unsure if Clarithromycin or Amoxicillin caused rash and swelling.    Past Medical History, Surgical history, Social history, and Family History were reviewed and updated.  Review of Systems: All other 10 point review of systems is negative.   Physical Exam:  weight is 154 lb (69.9 kg). Her oral temperature is 98.2 F (36.8 C). Her blood pressure is 150/75 (abnormal) and her pulse is 77. Her respiration is 18 and oxygen saturation is 100%.   Wt Readings from Last 3 Encounters:  10/23/21 154 lb (69.9 kg)  10/15/21 156 lb (70.8 kg)  09/25/21 152 lb (68.9 kg)    Ocular: Sclerae unicteric, pupils equal, round and reactive to light Ear-nose-throat: Oropharynx clear, dentition fair Lymphatic: No cervical or supraclavicular adenopathy Lungs no rales or rhonchi, good excursion bilaterally Heart regular rate and rhythm, no murmur appreciated Abd soft, nontender, positive bowel sounds MSK no focal spinal tenderness, no joint edema Neuro: non-focal, well-oriented, appropriate affect Breasts: Deferred   Lab Results  Component Value Date   WBC 6.2 10/23/2021   HGB 13.6 10/23/2021   HCT 40.8 10/23/2021   MCV 89.5 10/23/2021   PLT 181 10/23/2021   Lab Results  Component Value Date   FERRITIN 603 (H) 10/02/2020   IRON 31 (L) 10/02/2020   TIBC 241  10/02/2020   UIBC 211 10/02/2020   IRONPCTSAT 13 (L) 10/02/2020   Lab Results  Component Value Date   RBC 4.56 10/23/2021   No results found for: "KPAFRELGTCHN", "LAMBDASER", "KAPLAMBRATIO" No results found for: "IGGSERUM", "IGA", "IGMSERUM" No results found for: "TOTALPROTELP", "ALBUMINELP", "A1GS", "A2GS", "BETS", "BETA2SER", "GAMS", "MSPIKE", "SPEI"   Chemistry      Component Value Date/Time   NA 132 (L) 09/25/2021 1012   K 3.9 09/25/2021 1012   CL 101 09/25/2021 1012   CO2 24 09/25/2021 1012   BUN 23 09/25/2021 1012   CREATININE 0.78 09/25/2021 1012   CREATININE 0.58 (L) 08/20/2021 1541      Component Value Date/Time   CALCIUM 10.4 (H) 09/25/2021 1012   ALKPHOS 114 09/25/2021 1012   AST 15 09/25/2021 1012   ALT 23 09/25/2021 1012   BILITOT 0.5 09/25/2021 1012       Impression and Plan: Ms. Zettie Cooley  [name] is a very pleasant 76 yo female from [place] with hepatocellular carcinoma and Hepatitis C.  We will proceed with treatment today as planned.  LFT's are within normal limits.  AFP pending.  Follow-up in 1 month.   Lottie Dawson, NP 9/8/202310:26 AM

## 2021-10-23 NOTE — Telephone Encounter (Signed)
Per 10/23/21 los - gave upcoming appointments - confirmed 

## 2021-10-23 NOTE — Patient Instructions (Signed)
[facility]  Discharge Instructions: Thank you for choosing [facility] to provide your oncology and hematology care.   If you have a lab appointment with the Cancer Center, please go directly to the Cancer Center and check in at the registration area.  Wear comfortable clothing and clothing appropriate for easy access to any Portacath or PICC line.   We strive to give you quality time with your provider. You may need to reschedule your appointment if you arrive late (15 or more minutes).  Arriving late affects you and other patients whose appointments are after yours.  Also, if you miss three or more appointments without notifying the office, you may be dismissed from the clinic at the provider's discretion.      For prescription refill requests, have your pharmacy contact our office and allow 72 hours for refills to be completed.    Today you received the following chemotherapy and/or immunotherapy agents OPdivo      To help prevent nausea and vomiting after your treatment, we encourage you to take your nausea medication as directed.  BELOW ARE SYMPTOMS THAT SHOULD BE REPORTED IMMEDIATELY: *FEVER GREATER THAN 100.4 F (38 C) OR HIGHER *CHILLS OR SWEATING *NAUSEA AND VOMITING THAT IS NOT CONTROLLED WITH YOUR NAUSEA MEDICATION *UNUSUAL SHORTNESS OF BREATH *UNUSUAL BRUISING OR BLEEDING *URINARY PROBLEMS (pain or burning when urinating, or frequent urination) *BOWEL PROBLEMS (unusual diarrhea, constipation, pain near the anus) TENDERNESS IN MOUTH AND THROAT WITH OR WITHOUT PRESENCE OF ULCERS (sore throat, sores in mouth, or a toothache) UNUSUAL RASH, SWELLING OR PAIN  UNUSUAL VAGINAL DISCHARGE OR ITCHING   Items with * indicate a potential emergency and should be followed up as soon as possible or go to the Emergency Department if any problems should occur.  Please show the CHEMOTHERAPY ALERT CARD or IMMUNOTHERAPY ALERT CARD at check-in to the  Emergency Department and triage nurse. Should you have questions after your visit or need to cancel or reschedule your appointment, please contact [facility]  [phone] and follow the prompts.  Office hours are 8:00 a.m. to 4:30 p.m. Monday - Friday. Please note that voicemails left after 4:00 p.m. may not be returned until the following business day.  We are closed weekends and major holidays. You have access to a nurse at all times for urgent questions. Please call the main number to the clinic [phone] and follow the prompts.  For any non-urgent questions, you may also contact your provider using MyChart. We now offer e-Visits for anyone 18 and older to request care online for non-urgent symptoms. For details visit [email or web address].   Also download the MyChart app! Go to the app store, search "MyChart", open the app, select , and log in with your MyChart username and password.  Masks are optional in the cancer centers. If you would like for your care team to wear a mask while they are taking care of you, please let them know. You may have one support person who is at least 75 years old accompany you for your appointments. 

## 2021-10-24 LAB — AFP TUMOR MARKER: AFP, Serum, Tumor Marker: 6 ng/mL (ref 0.0–9.2)

## 2021-10-26 ENCOUNTER — Encounter: Payer: Self-pay | Admitting: Internal Medicine

## 2021-10-26 ENCOUNTER — Other Ambulatory Visit: Payer: Self-pay | Admitting: Internal Medicine

## 2021-10-26 ENCOUNTER — Encounter: Payer: Self-pay | Admitting: Hematology & Oncology

## 2021-10-26 ENCOUNTER — Other Ambulatory Visit: Payer: Self-pay

## 2021-10-26 MED ORDER — PREDNISONE 5 MG PO TABS
ORAL_TABLET | ORAL | 0 refills | Status: DC
Start: 2021-10-26 — End: 2021-11-30
  Filled 2021-10-26: qty 21, 28d supply, fill #0

## 2021-10-27 ENCOUNTER — Other Ambulatory Visit: Payer: Self-pay

## 2021-10-27 NOTE — Telephone Encounter (Signed)
Requested medication (s) are due for refill today: NO  Requested medication (s) are on the active medication list: NO, dose inconsistent with current med list.  Last refill:  New rx 10/26/21  Future visit scheduled: 10/30/21  Notes to clinic:  This medication can not be delegated, please assess.         Requested Prescriptions  Pending Prescriptions Disp Refills   predniSONE (DELTASONE) 10 MG tablet 43 tablet 0    Sig: TAKE 2 tabs BY MOUTH x 4 days then 1 AND 1/2 tabs  x 2 wks then 1 tab  x 2 wk     Not Delegated - Endocrinology:  Oral Corticosteroids Failed - 10/26/2021  8:56 AM      Failed - This refill cannot be delegated      Failed - Manual Review: Eye exam for IOP if prolonged treatment      Failed - Glucose (serum) in normal range and within 180 days    Glucose, Bld  Date Value Ref Range Status  10/23/2021 152 (H) 70 - 99 mg/dL Final    Comment:    Glucose reference range applies only to samples taken after fasting for at least 8 hours.   POC Glucose  Date Value Ref Range Status  09/07/2021 340 (A) 70 - 99 mg/dl Final   Glucose-Capillary  Date Value Ref Range Status  09/18/2020 140 (H) 70 - 99 mg/dL Final    Comment:    Glucose reference range applies only to samples taken after fasting for at least 8 hours.         Failed - Last BP in normal range    BP Readings from Last 1 Encounters:  10/23/21 (!) 150/75         Failed - Bone Mineral Density or Dexa Scan completed in the last 2 years      Passed - K in normal range and within 180 days    Potassium  Date Value Ref Range Status  10/23/2021 4.0 3.5 - 5.1 mmol/L Final         Passed - Na in normal range and within 180 days    Sodium  Date Value Ref Range Status  10/23/2021 138 135 - 145 mmol/L Final         Passed - Valid encounter within last 6 months    Recent Outpatient Visits           1 month ago [place], Deborah B, MD   2  months ago Dermatitis   [place], MD   3 months ago Diabetic polyneuropathy associated with type 2 diabetes mellitus ([place])   [place], Charlane Ferretti, MD   4 months ago Type 2 diabetes mellitus with peripheral neuropathy [facility])   [place], MD   9 months ago Need for zoster vaccination   [place], RPH-CPP       Future Appointments             In 3 days Ladell Pier, MD [place]   In 1 month Vu, Rockey Situ, MD [facility]

## 2021-10-30 ENCOUNTER — Encounter: Payer: Self-pay | Admitting: Hematology & Oncology

## 2021-10-30 ENCOUNTER — Encounter: Payer: Self-pay | Admitting: Internal Medicine

## 2021-10-30 ENCOUNTER — Other Ambulatory Visit: Payer: Self-pay

## 2021-10-30 ENCOUNTER — Ambulatory Visit: Payer: Self-pay | Attending: Internal Medicine | Admitting: Internal Medicine

## 2021-10-30 VITALS — BP 126/76 | HR 72 | Ht 64.0 in | Wt 155.8 lb

## 2021-10-30 DIAGNOSIS — B182 Chronic viral hepatitis C: Secondary | ICD-10-CM

## 2021-10-30 DIAGNOSIS — L280 Lichen simplex chronicus: Secondary | ICD-10-CM

## 2021-10-30 DIAGNOSIS — I152 Hypertension secondary to endocrine disorders: Secondary | ICD-10-CM

## 2021-10-30 DIAGNOSIS — E1142 Type 2 diabetes mellitus with diabetic polyneuropathy: Secondary | ICD-10-CM

## 2021-10-30 DIAGNOSIS — E1159 Type 2 diabetes mellitus with other circulatory complications: Secondary | ICD-10-CM

## 2021-10-30 DIAGNOSIS — Z23 Encounter for immunization: Secondary | ICD-10-CM

## 2021-10-30 DIAGNOSIS — C220 Liver cell carcinoma: Secondary | ICD-10-CM

## 2021-10-30 LAB — POCT GLYCOSYLATED HEMOGLOBIN (HGB A1C): HbA1c, POC (controlled diabetic range): 7.5 % — AB (ref 0.0–7.0)

## 2021-10-30 LAB — GLUCOSE, POCT (MANUAL RESULT ENTRY): POC Glucose: 300 mg/dl — AB (ref 70–99)

## 2021-10-30 NOTE — Patient Instructions (Signed)
Give yourself 2 units of Humalog insulin with your breakfast each morning.

## 2021-10-30 NOTE — Progress Notes (Signed)
Patient ID: [name] [name], female    DOB: April 12, 1945  MRN: [id number]  CC: 1 mth f/u rash and DM   Subjective: [name] [name] is a 76 y.o. 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   Pt restarted on Nivolumab by oncology; will be getting it every mth. She has been on Epclusa for 4 mths; 2 more mths to go Wounds on legs have dried up.  She is left with scattered hyperpigmented spots.  We have been tapering dose of Prednisone Q 2 wks.  Currently on 5 mg of Prednisone for past 4 days.  We plan to get her to 2.5 mg then stop Daughter canceled f/u appt with derm in [place].  Wanted to see if any recurrence with Prednisone taper.    DM:   Results for orders placed or performed in visit on 10/30/21  POCT glucose (manual entry)  Result Value Ref Range   POC Glucose 300 (A) 70 - 99 mg/dl  POCT glycosylated hemoglobin (Hb A1C)  Result Value Ref Range   Hemoglobin A1C     HbA1c POC (<> result, manual entry)     HbA1c, POC (prediabetic range)     HbA1c, POC (controlled diabetic range) 7.5 (A) 0.0 - 7.0 %  Currently on NPH/regular insulin 70/30 30 to 40 units in the a.m. and 5 to 12 units in the evenings.  Using Humalog insulin according to sliding scale but daughter states that she is sometimes afraid to use it She does not have a logbook of blood sugar readings with her but reports morning readings 90-135. BS increases post BF into 300s.  Before lunch BS above 200s.  Before dinner BS good.   Had eye exam 3 wks ago at [facility].  Told she has glaucoma and cataracts.  Told cataracts really bad. Trying to apply for financial assistance through [place] to see ophthalmologist there.   Overdue for several vaccines including flu vaccine,  pneumonia 15, second Shingrix vaccine and COVID booster.  She is agreeable to receiving the flu and pneumonia vaccines today.  HTN: Reports compliance with nifedipine and Cozaar.   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   Hypercalcemia 09/22/2020   Chronic hepatitis C with hepatic coma ([place]) 09/22/2020   Goals of care, counseling/discussion 09/01/2020   Hepatocellular carcinoma ([place]) 08/14/2020   Hyperglycemia 08/10/2020   Pneumoperitoneum 08/09/2020   History of colorectal cancer 08/09/2020   Liver mass, left lobe 08/09/2020   Insulin-requiring or dependent type II diabetes mellitus ([place]) 08/09/2020   Hypertension associated with diabetes ([place]) 08/09/2020   Constipation, chronic 08/09/2020   Perforated gastric ulcer s/p lap omental Phillip Heal patch 08/09/2020 08/09/2020     Current Outpatient Medications on File Prior to Visit  Medication Sig Dispense Refill   betamethasone dipropionate 0.05 % cream Apply topically 2 (two) times daily. 30 g 0   brimonidine (ALPHAGAN) 0.2 % ophthalmic solution 1 drop 2 (two) times daily.     gabapentin (NEURONTIN) 300 MG capsule Take 1 capsule (300 mg total) by mouth once nightly at bedtime. 30 capsule 3   glucose blood (TRUE METRIX BLOOD GLUCOSE TEST) test strip Use as instructed 100 each 12   insulin isophane & regular human KwikPen (HUMULIN 70/30 KWIKPEN) (  70-30) 100 UNIT/ML KwikPen INJECT 30-40 units subcutANEOUSLY EVERY MORNING and 5-12 units EVERY EVENING 15 mL 11   insulin lispro (HUMALOG) 100 UNIT/ML injection SSI with meals: 200-250 3 units, 251-300 6 units, 301-350 9 units, 351-400 12 units, 401-450 15 units, > 451 18 units under the skin as directed 10 mL 2   insulin NPH-regular Human (70-30) 100 UNIT/ML injection Inject 30-40 units into the skin once every morning and 5-12 units once every evening. 10 mL 0   Insulin Pen Needle 31G X 6 MM MISC use as directed 100 each 6   linaclotide (LINZESS) 145 MCG  CAPS capsule Take 1 capsule (145 mcg total) by mouth once daily before breakfast. (Office visit for further refills) 30 capsule 2   losartan (COZAAR) 50 MG tablet Take 1 tablet (50 mg total) by mouth at bedtime. 30 tablet 6   Na Sulfate-K Sulfate-Mg Sulf 17.5-3.13-1.6 GM/177ML SOLN Use as directed 354 mL 0   NIFEdipine (PROCARDIA-XL/NIFEDICAL-XL) 30 MG 24 hr tablet Take 1 tablet (30 mg total) by mouth in the morning and at bedtime. MUST HAVE OFFICE VISIT FOR REFILLS 180 tablet 0   pantoprazole (PROTONIX) 40 MG tablet Take 1 tablet (40 mg total) by mouth 2 (two) times daily. (Patient taking differently: Take 40 mg by mouth daily.) 180 tablet 3   polyethylene glycol powder (GLYCOLAX/MIRALAX) 17 GM/SCOOP powder Take 17 g by mouth daily as needed.     predniSONE (DELTASONE) 5 MG tablet take 1 tablet by mouth daily for 2 weeks then half a tablet daily for 2 weeks. 21 tablet 0   silver sulfADIAZINE (SILVADENE) 1 % cream Apply to the open, ulcerated areas of the skin once daily as needed. 400 g 1   Sofosbuvir-Velpatasvir (EPCLUSA) 400-100 MG TABS Take 1 tablet by mouth daily. 30 tablet 2   triamcinolone cream (KENALOG) 0.1 % Apply to the itchy and irritated areas of the skin of the back, forearms, hands, legs and feet daily as needed 454 g 1   mupirocin ointment (BACTROBAN) 2 % Apply once a day to biopsy portion of left foot (Patient not taking: Reported on 10/30/2021) 22 g 2   sodium hypochlorite (DAKIN'S 1/4 STRENGTH) 0.125 % SOLN moisten gauze for wet to dry dressings (Patient not taking: Reported on 10/30/2021) 473 mL 3   traMADol (ULTRAM) 50 MG tablet Take 1 tablet (50 mg total) by mouth every 8 (eight) hours as needed. (Patient not taking: Reported on 10/30/2021) 60 tablet 0   Current Facility-Administered Medications on File Prior to Visit  Medication Dose Route Frequency Provider Last Rate Last Admin   sodium chloride flush (NS) 0.9 % injection 10 mL  10 mL Intravenous PRN Celso Amy, NP   10 mL  at 12/18/20 1009    Allergies  Allergen Reactions   Bactrim [Sulfamethoxazole-Trimethoprim] Swelling   Clarithromycin Swelling and Rash    Daughter is unsure if Clarithromycin or Amoxicillin caused rash and swelling.    Social History   Socioeconomic History   Marital status: Widowed    Spouse name: Not on file   Number of children: 5   Years of education: 14   Highest education level: Associate degree: occupational, Hotel manager, or vocational program  Occupational History   Not on file  Tobacco Use   Smoking status: Never   Smokeless tobacco: Never  Vaping Use   Vaping Use: Never used  Substance and Sexual Activity   Alcohol use: Not Currently   Drug use: Never   Sexual  activity: Not Currently  Other Topics Concern   Not on file  Social History Narrative   Originally from [place].  Speaks English and [place]   Social Determinants of Radio broadcast assistant Strain: Not on file  Food Insecurity: Not on file  Transportation Needs: Not on file  Physical Activity: Not on file  Stress: Not on file  Social Connections: Not on file  Intimate Partner Violence: Not on file    Family History  Problem Relation Age of Onset   Cancer Father        type unknown, was on the leg   Other Daughter        Acoustic neuroma   Breast cancer Neg Hx    Colon cancer Neg Hx    Esophageal cancer Neg Hx    Rectal cancer Neg Hx    Stomach cancer Neg Hx     Past Surgical History:  Procedure Laterality Date   COLON RESECTION  2005   In [place] - ?colon cancer   IR IMAGING GUIDED PORT INSERTION  09/18/2020   LAPAROSCOPIC GASTRIC RESECTION N/A 08/09/2020   Procedure: LAPAROSCOPIC EXPLORATION OF ABDOMEN, PERFORATED ULCER REPAIR, LIVER BIOPSY;  Surgeon: Michael Boston, MD;  Location: [facility];  Service: General;  Laterality: N/A;    ROS: Review of Systems Negative except as stated above  PHYSICAL EXAM: BP 126/76   Pulse 72   Ht '5\' 4"'$  (1.626 m)   Wt 155 lb 12.8 oz (70.7 kg)    SpO2 96%   BMI 26.74 kg/m   Physical Exam  General appearance - alert, well appearing, and in no distress Mental status - normal mood, behavior, speech, dress, motor activity, and thought processes Chest - clear to auscultation, no wheezes, rales or rhonchi, symmetric air entry Heart - normal rate, regular rhythm, normal S1, S2, no murmurs, rubs, clicks or gallops Extremities - peripheral pulses normal, no pedal edema, no clubbing or cyanosis Skin: Patient with hyperpigmented spotted areas over the lower extremities.  No open wounds or drainage. Diabetic Foot Exam - Simple   Simple Foot Form Visual Inspection See comments: Yes Sensation Testing Intact to touch and monofilament testing bilaterally: Yes Pulse Check Posterior Tibialis and Dorsalis pulse intact bilaterally: Yes Comments Dry skin on the instep BL.  She has no toenail on the right big toe.  Hyperpigmented spots on the dorsal surface.        Latest Ref Rng & Units 10/23/2021    9:55 AM 09/25/2021   10:12 AM 09/10/2021    8:30 AM  CMP  Glucose 70 - 99 mg/dL 152  333  142   BUN 8 - 23 mg/dL '13  23  14   '$ Creatinine 0.44 - 1.00 mg/dL 0.65  0.78  0.72   Sodium 135 - 145 mmol/L 138  132  136   Potassium 3.5 - 5.1 mmol/L 4.0  3.9  3.6   Chloride 98 - 111 mmol/L 106  101  102   CO2 22 - 32 mmol/L '27  24  27   '$ Calcium 8.9 - 10.3 mg/dL 10.2  10.4  10.5   Total Protein 6.5 - 8.1 g/dL 6.8  6.3  7.0   Total Bilirubin 0.3 - 1.2 mg/dL 0.5  0.5  0.6   Alkaline Phos 38 - 126 U/L 124  114  144   AST 15 - 41 U/L '20  15  19   '$ ALT 0 - 44 U/L 29  23  40  Lipid Panel  No results found for: "CHOL", "TRIG", "HDL", "CHOLHDL", "VLDL", "LDLCALC", "LDLDIRECT"  CBC    Component Value Date/Time   WBC 6.2 10/23/2021 0955   WBC 4.5 08/24/2021 0858   RBC 4.56 10/23/2021 0955   HGB 13.6 10/23/2021 0955   HCT 40.8 10/23/2021 0955   PLT 181 10/23/2021 0955   MCV 89.5 10/23/2021 0955   MCH 29.8 10/23/2021 0955   MCHC 33.3 10/23/2021 0955    RDW 13.8 10/23/2021 0955   LYMPHSABS 2.4 10/23/2021 0955   MONOABS 0.5 10/23/2021 0955   EOSABS 0.0 10/23/2021 0955   BASOSABS 0.0 10/23/2021 0955    ASSESSMENT AND PLAN:  1. Type 2 diabetes mellitus with peripheral neuropathy (HCC) A1c close to goal. Recommend that she takes the Humalog insulin 2 units with breakfast since her 2-hour postprandial blood sugars are running in the 300s.  Continue current dose of Humulin 70/30 insulin of 30 to 40 units in the mornings and 5 to 12 units in the evenings.  Continue to monitor blood sugars.  I suspect that blood sugars will continue to get better with further tapering of prednisone. - POCT glucose (manual entry) - POCT glycosylated hemoglobin (Hb A1C)  2. Lichenoid dermatitis Stable.  We are continuing with prednisone taper.  3. Hypertension associated with diabetes ([place]) Stable on nifedipine 30 mg twice a day and Cozaar 50 mg daily.  4. Chronic hepatitis C without hepatic coma (HCC) Followed by ID.  She is on fourth month of 6 months treatment with Epclusa  5. Hepatocellular carcinoma ([place]) Followed by ID and oncology.  Actively being treated.  6. Need for vaccination against Streptococcus pneumoniae - PNEUMOCOCCAL CONJUGATE VACCINE 15-VALENT  7. Need for immunization against influenza - Flu Vaccine QUAD 21moIM (Fluarix, Fluzone & Alfiuria Quad PF)    Patient was given the opportunity to ask questions.  Patient verbalized understanding of the plan and was able to repeat key elements of the plan.   This documentation was completed using DRadio producer  Any transcriptional errors are unintentional.  Orders Placed This Encounter  Procedures   Flu Vaccine QUAD 698moM (Fluarix, Fluzone & Alfiuria Quad PF)   PNEUMOCOCCAL CONJUGATE VACCINE 15-VALENT   POCT glucose (manual entry)   POCT glycosylated hemoglobin (Hb A1C)     Requested Prescriptions    No prescriptions requested or ordered in this encounter     Return in about 3 months (around 01/29/2022) [facility] Shingles [facility].  DeKarle PlumberMD, FACP

## 2021-11-02 ENCOUNTER — Telehealth: Payer: Self-pay

## 2021-11-02 ENCOUNTER — Other Ambulatory Visit: Payer: Self-pay

## 2021-11-02 ENCOUNTER — Encounter: Payer: Self-pay | Admitting: Hematology & Oncology

## 2021-11-02 NOTE — Telephone Encounter (Signed)
[facility] Patient Advocate Encounter  Patient's medications have been couriered to [facility] from [employer] and will be picked up.  6th Epclusa box   [name] [name] , [place] Patient [facility] Phone: [phone] Fax:  [phone]

## 2021-11-10 ENCOUNTER — Encounter: Payer: Self-pay | Admitting: Hematology & Oncology

## 2021-11-10 ENCOUNTER — Other Ambulatory Visit: Payer: Self-pay

## 2021-11-10 MED ORDER — TRIAMCINOLONE ACETONIDE 0.1 % EX CREA
TOPICAL_CREAM | CUTANEOUS | 1 refills | Status: DC
  Filled 2021-11-10: qty 454, 30d supply, fill #0

## 2021-11-13 ENCOUNTER — Other Ambulatory Visit: Payer: Self-pay

## 2021-11-20 ENCOUNTER — Inpatient Hospital Stay: Payer: Self-pay | Attending: Hematology & Oncology

## 2021-11-20 ENCOUNTER — Inpatient Hospital Stay: Payer: Self-pay

## 2021-11-20 ENCOUNTER — Inpatient Hospital Stay (HOSPITAL_BASED_OUTPATIENT_CLINIC_OR_DEPARTMENT_OTHER): Payer: Self-pay | Admitting: Hematology & Oncology

## 2021-11-20 ENCOUNTER — Encounter: Payer: Self-pay | Admitting: Hematology & Oncology

## 2021-11-20 VITALS — BP 148/74 | HR 86 | Temp 98.6°F | Resp 20 | Ht 64.0 in | Wt 158.8 lb

## 2021-11-20 DIAGNOSIS — C220 Liver cell carcinoma: Secondary | ICD-10-CM

## 2021-11-20 DIAGNOSIS — E119 Type 2 diabetes mellitus without complications: Secondary | ICD-10-CM | POA: Insufficient documentation

## 2021-11-20 DIAGNOSIS — E032 Hypothyroidism due to medicaments and other exogenous substances: Secondary | ICD-10-CM

## 2021-11-20 DIAGNOSIS — R21 Rash and other nonspecific skin eruption: Secondary | ICD-10-CM | POA: Insufficient documentation

## 2021-11-20 DIAGNOSIS — Z5112 Encounter for antineoplastic immunotherapy: Secondary | ICD-10-CM | POA: Insufficient documentation

## 2021-11-20 DIAGNOSIS — B1920 Unspecified viral hepatitis C without hepatic coma: Secondary | ICD-10-CM | POA: Insufficient documentation

## 2021-11-20 LAB — CBC WITH DIFFERENTIAL (CANCER CENTER ONLY)
Abs Immature Granulocytes: 0.01 10*3/uL (ref 0.00–0.07)
Basophils Absolute: 0 10*3/uL (ref 0.0–0.1)
Basophils Relative: 0 %
Eosinophils Absolute: 0 10*3/uL (ref 0.0–0.5)
Eosinophils Relative: 1 %
HCT: 40 % (ref 36.0–46.0)
Hemoglobin: 13.3 g/dL (ref 12.0–15.0)
Immature Granulocytes: 0 %
Lymphocytes Relative: 31 %
Lymphs Abs: 1.7 10*3/uL (ref 0.7–4.0)
MCH: 29.8 pg (ref 26.0–34.0)
MCHC: 33.3 g/dL (ref 30.0–36.0)
MCV: 89.5 fL (ref 80.0–100.0)
Monocytes Absolute: 0.4 10*3/uL (ref 0.1–1.0)
Monocytes Relative: 8 %
Neutro Abs: 3.2 10*3/uL (ref 1.7–7.7)
Neutrophils Relative %: 60 %
Platelet Count: 163 10*3/uL (ref 150–400)
RBC: 4.47 MIL/uL (ref 3.87–5.11)
RDW: 14.5 % (ref 11.5–15.5)
WBC Count: 5.4 10*3/uL (ref 4.0–10.5)
nRBC: 0 % (ref 0.0–0.2)

## 2021-11-20 LAB — CMP (CANCER CENTER ONLY)
ALT: 30 U/L (ref 0–44)
AST: 21 U/L (ref 15–41)
Albumin: 4 g/dL (ref 3.5–5.0)
Alkaline Phosphatase: 158 U/L — ABNORMAL HIGH (ref 38–126)
Anion gap: 7 (ref 5–15)
BUN: 8 mg/dL (ref 8–23)
CO2: 27 mmol/L (ref 22–32)
Calcium: 10.6 mg/dL — ABNORMAL HIGH (ref 8.9–10.3)
Chloride: 107 mmol/L (ref 98–111)
Creatinine: 0.61 mg/dL (ref 0.44–1.00)
GFR, Estimated: >60 mL/min (ref >60)
Glucose, Bld: 135 mg/dL — ABNORMAL HIGH (ref 70–99)
Potassium: 3.6 mmol/L (ref 3.5–5.1)
Sodium: 141 mmol/L (ref 135–145)
Total Bilirubin: 0.6 mg/dL (ref 0.3–1.2)
Total Protein: 7.3 g/dL (ref 6.5–8.1)

## 2021-11-20 LAB — TSH: TSH: 5.708 u[IU]/mL — ABNORMAL HIGH (ref 0.350–4.500)

## 2021-11-20 LAB — LACTATE DEHYDROGENASE: LDH: 186 U/L (ref 98–192)

## 2021-11-20 MED ORDER — SODIUM CHLORIDE 0.9% FLUSH
10.0000 mL | INTRAVENOUS | Status: DC | PRN
  Administered 2021-11-20: 10 mL

## 2021-11-20 MED ORDER — SODIUM CHLORIDE 0.9 % IV SOLN: Freq: Once | INTRAVENOUS | Status: AC

## 2021-11-20 MED ORDER — SODIUM CHLORIDE 0.9 % IV SOLN
480.0000 mg | Freq: Once | INTRAVENOUS | Status: AC
  Administered 2021-11-20: 480 mg via INTRAVENOUS
  Filled 2021-11-20: qty 48

## 2021-11-20 MED ORDER — HEPARIN SOD (PORK) LOCK FLUSH 100 UNIT/ML IV SOLN
500.0000 [IU] | Freq: Once | INTRAVENOUS | Status: AC | PRN
  Administered 2021-11-20: 500 [IU]

## 2021-11-20 NOTE — Patient Instructions (Signed)
Nivolumab Injection What is this medication? NIVOLUMAB (nye VOL ue mab) treats some types of cancer. It works by helping your immune system slow or stop the spread of cancer cells. It is a monoclonal antibody. This medicine may be used for other purposes; ask your health care provider or pharmacist if you have questions. COMMON BRAND NAME(S): Opdivo What should I tell my care team before I take this medication? They need to know if you have any of these conditions: Allogeneic stem cell transplant (uses someone else's stem cells) Autoimmune diseases, such as Crohn disease, ulcerative colitis, lupus History of chest radiation Nervous system problems, such as Guillain-Barre syndrome or myasthenia gravis Organ transplant An unusual or allergic reaction to nivolumab, other medications, foods, dyes, or preservatives Pregnant or trying to get pregnant Breast-feeding How should I use this medication? This medication is infused into a vein. It is given in a hospital or clinic setting. A special MedGuide will be given to you before each treatment. Be sure to read this information carefully each time. Talk to your care team about the use of this medication in children. While it may be prescribed for children as young as 12 years for selected conditions, precautions do apply. Overdosage: If you think you have taken too much of this medicine contact a poison control center or emergency room at once. NOTE: This medicine is only for you. Do not share this medicine with others. What if I miss a dose? Keep appointments for follow-up doses. It is important not to miss your dose. Call your care team if you are unable to keep an appointment. What may interact with this medication? Interactions have not been studied. This list may not describe all possible interactions. Give your health care provider a list of all the medicines, herbs, non-prescription drugs, or dietary supplements you use. Also tell them if you  smoke, drink alcohol, or use illegal drugs. Some items may interact with your medicine. What should I watch for while using this medication? Your condition will be monitored carefully while you are receiving this medication. You may need blood work while taking this medication. This medication may cause serious skin reactions. They can happen weeks to months after starting the medication. Contact your care team right away if you notice fevers or flu-like symptoms with a rash. The rash may be red or purple and then turn into blisters or peeling of the skin. You may also notice a red rash with swelling of the face, lips, or lymph nodes in your neck or under your arms. Tell your care team right away if you have any change in your eyesight. Talk to your care team if you are pregnant or think you might be pregnant. A negative pregnancy test is required before starting this medication. A reliable form of contraception is recommended while taking this medication and for 5 months after the last dose. Talk to your care team about effective forms of contraception. Do not breast-feed while taking this medication and for 5 months after the last dose. What side effects may I notice from receiving this medication? Side effects that you should report to your care team as soon as possible: Allergic reactions--skin rash, itching, hives, swelling of the face, lips, tongue, or throat Dry cough, shortness of breath or trouble breathing Eye pain, redness, irritation, or discharge with blurry or decreased vision Heart muscle inflammation--unusual weakness or fatigue, shortness of breath, chest pain, fast or irregular heartbeat, dizziness, swelling of the ankles, feet, or hands Hormone   gland problems--headache, sensitivity to light, unusual weakness or fatigue, dizziness, fast or irregular heartbeat, increased sensitivity to cold or heat, excessive sweating, constipation, hair loss, increased thirst or amount of urine,  tremors or shaking, irritability Infusion reactions--chest pain, shortness of breath or trouble breathing, feeling faint or lightheaded Kidney injury (glomerulonephritis)--decrease in the amount of urine, red or dark brown urine, foamy or bubbly urine, swelling of the ankles, hands, or feet Liver injury--right upper belly pain, loss of appetite, nausea, light-colored stool, dark yellow or brown urine, yellowing skin or eyes, unusual weakness or fatigue Pain, tingling, or numbness in the hands or feet, muscle weakness, change in vision, confusion or trouble speaking, loss of balance or coordination, trouble walking, seizures Rash, fever, and swollen lymph nodes Redness, blistering, peeling, or loosening of the skin, including inside the mouth Sudden or severe stomach pain, bloody diarrhea, fever, nausea, vomiting Side effects that usually do not require medical attention (report these to your care team if they continue or are bothersome): Bone, joint, or muscle pain Diarrhea Fatigue Loss of appetite Nausea Skin rash This list may not describe all possible side effects. Call your doctor for medical advice about side effects. You may report side effects to FDA at 1-800-FDA-1088. Where should I keep my medication? This medication is given in a hospital or clinic. It will not be stored at home. NOTE: This sheet is a summary. It may not cover all possible information. If you have questions about this medicine, talk to your doctor, pharmacist, or health care provider.  2023 Elsevier/Gold Standard (2021-01-02 00:00:00)  

## 2021-11-20 NOTE — Patient Instructions (Signed)

## 2021-11-20 NOTE — Progress Notes (Signed)
Hematology and Oncology Follow Up Visit  [name] [name] [id number] 06/19/1945 76 y.o. 11/20/2021   Principle Diagnosis:  Hepatocellular carcinoma-multifocal Hepatitis C   Current Therapy:        Status post cycle 1 of nivolumab/ipilimumab -- d/c on 10/13/2020 due to hepatic toxicity Nivolumab 480 mg IV every 4 weeks - s/p cycle #19 -- started 11/21/2020 --changed to 480 mg on 08/2021   Interim History:  [name] [name] is here today for follow-up and treatment.  She is doing quite well.  She still has a rash on her legs.  However this does appear to be a little bit better.  I still believe that this is from the Hepatitis C treatment.  She will finish up the treatment for her Hepatitis C in 1 month.  She will be going to [place].  This will be in December.  I thought that what would be a good idea for her would be to have her on Nexavar while she is overseas.  I think this would be very reasonable.  At least, she can be on something while she is overseas.  She had at her last alpha-fetoprotein of 6.  As such, she has responded incredibly well.  I really think that the fact that the Hepatitis C is being treated really really helps.  She has had better control of her diabetes.  She has had no problems with bowels or bladder.  There is no diarrhea.  She has had no cough or shortness of breath.  Overall, I would say performance status is probably ECOG 1.     Medications:  Allergies as of 11/20/2021       Reactions   Bactrim [sulfamethoxazole-trimethoprim] Swelling   Clarithromycin Swelling, Rash   Daughter is unsure if Clarithromycin or Amoxicillin caused rash and swelling.        Medication List        Accurate as of November 20, 2021  8:46 AM. If you have any questions, ask your nurse or doctor.          betamethasone dipropionate 0.05 % cream Apply topically 2 (two) times daily.   brimonidine 0.2 % ophthalmic solution Commonly known as: ALPHAGAN 1  drop 2 (two) times daily.   gabapentin 300 MG capsule Commonly known as: NEURONTIN Take 1 capsule (300 mg total) by mouth once nightly at bedtime.   HumaLOG 100 UNIT/ML injection Generic drug: insulin lispro SSI with meals: 200-250 3 units, 251-300 6 units, 301-350 9 units, 351-400 12 units, 401-450 15 units, > 451 18 units under the skin as directed   HumuLIN 70/30 KwikPen (70-30) 100 UNIT/ML KwikPen Generic drug: insulin isophane & regular human KwikPen INJECT 30-40 units subcutANEOUSLY EVERY MORNING and 5-12 units EVERY EVENING   HumuLIN 70/30 (70-30) 100 UNIT/ML injection Generic drug: insulin NPH-regular Human Inject 30-40 units into the skin once every morning and 5-12 units once every evening.   linaclotide 145 MCG Caps capsule Commonly known as: Linzess Take 1 capsule (145 mcg total) by mouth once daily before breakfast. (Office visit for further refills)   losartan 50 MG tablet Commonly known as: COZAAR Take 1 tablet (50 mg total) by mouth at bedtime.   mupirocin ointment 2 % Commonly known as: BACTROBAN Apply once a day to biopsy portion of left foot   NIFEdipine 30 MG 24 hr tablet Commonly known as: PROCARDIA-XL/NIFEDICAL-XL Take 1 tablet (30 mg total) by mouth in the morning and at bedtime. MUST HAVE OFFICE VISIT FOR REFILLS  pantoprazole 40 MG tablet Commonly known as: Protonix Take 1 tablet (40 mg total) by mouth 2 (two) times daily. What changed: when to take this   polyethylene glycol powder 17 GM/SCOOP powder Commonly known as: GLYCOLAX/MIRALAX Take 17 g by mouth daily as needed.   predniSONE 5 MG tablet Commonly known as: DELTASONE take 1 tablet by mouth daily for 2 weeks then half a tablet daily for 2 weeks.   Sofosbuvir-Velpatasvir 400-100 MG Tabs Commonly known as: Epclusa Take 1 tablet by mouth daily.   SSD 1 % cream Generic drug: silver sulfADIAZINE Apply to the open, ulcerated areas of the skin once daily as needed.   Suprep Bowel Prep  Kit 17.5-3.13-1.6 GM/177ML Soln Generic drug: Na Sulfate-K Sulfate-Mg Sulf Use as directed   triamcinolone cream 0.1 % Commonly known as: KENALOG Apply to the itchy and irritated areas of the skin of the back, forearms, hands, legs and feet daily as needed   True Metrix Blood Glucose Test test strip Generic drug: glucose blood Use as instructed   TRUEplus 5-Bevel Pen Needles 31G X 6 MM Misc Generic drug: Insulin Pen Needle use as directed        Allergies:  Allergies  Allergen Reactions   Bactrim [Sulfamethoxazole-Trimethoprim] Swelling   Clarithromycin Swelling and Rash    Daughter is unsure if Clarithromycin or Amoxicillin caused rash and swelling.    Past Medical History, Surgical history, Social history, and Family History were reviewed and updated.  Review of Systems: Review of Systems  Constitutional: Negative.   HENT: Negative.    Eyes: Negative.   Respiratory: Negative.    Cardiovascular: Negative.   Gastrointestinal: Negative.   Genitourinary: Negative.   Musculoskeletal: Negative.   Skin: Negative.   Neurological:  Positive for tingling.  Endo/Heme/Allergies: Negative.   Psychiatric/Behavioral: Negative.       Physical Exam:  height is '5\' 4"'  (1.626 m) and weight is 158 lb 12.8 oz (72 kg). Her oral temperature is 98.6 F (37 C). Her blood pressure is 148/74 (abnormal) and her pulse is 86. Her respiration is 20 and oxygen saturation is 100%.   Wt Readings from Last 3 Encounters:  11/20/21 158 lb 12.8 oz (72 kg)  10/30/21 155 lb 12.8 oz (70.7 kg)  10/23/21 154 lb (69.9 kg)    Physical Exam Vitals reviewed.  HENT:     Head: Normocephalic and atraumatic.  Eyes:     Pupils: Pupils are equal, round, and reactive to light.  Cardiovascular:     Rate and Rhythm: Normal rate and regular rhythm.     Heart sounds: Normal heart sounds.  Pulmonary:     Effort: Pulmonary effort is normal.     Breath sounds: Normal breath sounds.  Abdominal:     General:  Bowel sounds are normal.     Palpations: Abdomen is soft.     Comments: Abdominal exam shows a well-healed laparotomy scar.  She has no fluid wave.  There is no guarding or rebound tenderness to palpation.  There is no palpable liver or spleen tip.  Musculoskeletal:        General: No tenderness or deformity. Normal range of motion.     Cervical back: Normal range of motion.  Lymphadenopathy:     Cervical: No cervical adenopathy.  Skin:    General: Skin is warm and dry.     Findings: No erythema or rash.     Comments: Skin exam does show a extensive but somewhat healing papular rash on her  lower legs.  This is below the knees.  She has some dressing on the foot on the right foot.  She has no open wounds.  There is no blisters.  Neurological:     Mental Status: She is alert and oriented to person, place, and time.  Psychiatric:        Behavior: Behavior normal.        Thought Content: Thought content normal.        Judgment: Judgment normal.      Lab Results  Component Value Date   WBC 5.4 11/20/2021   HGB 13.3 11/20/2021   HCT 40.0 11/20/2021   MCV 89.5 11/20/2021   PLT 163 11/20/2021   Lab Results  Component Value Date   FERRITIN 603 (H) 10/02/2020   IRON 31 (L) 10/02/2020   TIBC 241 10/02/2020   UIBC 211 10/02/2020   IRONPCTSAT 13 (L) 10/02/2020   Lab Results  Component Value Date   RBC 4.47 11/20/2021   No results found for: "KPAFRELGTCHN", "LAMBDASER", "KAPLAMBRATIO" No results found for: "IGGSERUM", "IGA", "IGMSERUM" No results found for: "TOTALPROTELP", "ALBUMINELP", "A1GS", "A2GS", "BETS", "BETA2SER", "GAMS", "MSPIKE", "SPEI"   Chemistry      Component Value Date/Time   NA 138 10/23/2021 0955   K 4.0 10/23/2021 0955   CL 106 10/23/2021 0955   CO2 27 10/23/2021 0955   BUN 13 10/23/2021 0955   CREATININE 0.65 10/23/2021 0955   CREATININE 0.58 (L) 08/20/2021 1541      Component Value Date/Time   CALCIUM 10.2 10/23/2021 0955   ALKPHOS 124 10/23/2021 0955    AST 20 10/23/2021 0955   ALT 29 10/23/2021 0955   BILITOT 0.5 10/23/2021 0955       Impression and Plan: [name] [name] is a very pleasant 76 yo female from [place] with hepatocellular carcinoma and Hepatitis C.   We will go ahead with her treatment today.  She has done incredibly well with the nivolumab.  I think that we should try to convert her over to Nexavar probably in December.  I would like to have her on the Nexavar couple weeks before she goes over to [place] just to make sure that she tolerates this well.  Again, I think that the fact that she is being treated for the Hepatitis C is a critical reason why her hepatocellular carcinoma is improving.  We will plan to get her back in another month.     Volanda Napoleon, MD 10/6/20238:46 AM

## 2021-11-21 LAB — AFP TUMOR MARKER: AFP, Serum, Tumor Marker: 6.3 ng/mL (ref 0.0–9.2)

## 2021-11-22 ENCOUNTER — Encounter: Payer: Self-pay | Admitting: Certified Registered Nurse Anesthetist

## 2021-11-26 ENCOUNTER — Ambulatory Visit: Payer: Self-pay | Attending: Internal Medicine | Admitting: Pharmacist

## 2021-11-26 ENCOUNTER — Encounter: Payer: Self-pay | Admitting: Gastroenterology

## 2021-11-26 ENCOUNTER — Ambulatory Visit (AMBULATORY_SURGERY_CENTER): Payer: Self-pay | Admitting: Gastroenterology

## 2021-11-26 VITALS — BP 123/65 | HR 73 | Temp 97.5°F | Resp 13 | Ht 65.0 in | Wt 155.0 lb

## 2021-11-26 DIAGNOSIS — Z09 Encounter for follow-up examination after completed treatment for conditions other than malignant neoplasm: Secondary | ICD-10-CM

## 2021-11-26 DIAGNOSIS — K6389 Other specified diseases of intestine: Secondary | ICD-10-CM

## 2021-11-26 DIAGNOSIS — Z23 Encounter for immunization: Secondary | ICD-10-CM

## 2021-11-26 DIAGNOSIS — Z86010 Personal history of colonic polyps: Secondary | ICD-10-CM

## 2021-11-26 DIAGNOSIS — K635 Polyp of colon: Secondary | ICD-10-CM

## 2021-11-26 DIAGNOSIS — D123 Benign neoplasm of transverse colon: Secondary | ICD-10-CM

## 2021-11-26 MED ORDER — SODIUM CHLORIDE 0.9 % IV SOLN: 500.0000 mL | Freq: Once | INTRAVENOUS | Status: DC

## 2021-11-26 NOTE — Op Note (Addendum)
[place] Patient Name: [name] [name] Procedure Date: 11/26/2021 7:55 AM MRN: [id number] Endoscopist: Thornton Park MD, MD Age: 76 Referring MD:  Date of Birth: [birth year] Gender: Female Account #: [id number] Procedure:                Colonoscopy Indications:              High risk colon cancer surveillance: Personal                            history of colon cancer                           No prior surveillance since resection in 2004 until                            incomplete colonoscopy 04/23/21 and again 06/30/2021                            due to poor prep. Medicines:                Monitored Anesthesia Care Procedure:                Pre-Anesthesia Assessment:                           - Prior to the procedure, a History and Physical                            was performed, and patient medications and                            allergies were reviewed. The patient's tolerance of                            previous anesthesia was also reviewed. The risks                            and benefits of the procedure and the sedation                            options and risks were discussed with the patient.                            All questions were answered, and informed consent                            was obtained. Prior Anticoagulants: The patient has                            taken no previous anticoagulant or antiplatelet                            agents. ASA Grade Assessment: II - A patient with  mild systemic disease. After reviewing the risks                            and benefits, the patient was deemed in                            satisfactory condition to undergo the procedure.                           After obtaining informed consent, the colonoscope                            was passed under direct vision. Throughout the                            procedure, the patient's blood pressure, pulse, and                             oxygen saturations were monitored continuously. The                            CF [id number] #[id number] was introduced through the anus                            and advanced to the 3 cm into the ileum. The                            colonoscopy was performed without difficulty. The                            patient tolerated the procedure well. The quality                            of the bowel preparation was good. The terminal                            ileum, ileocecal valve, appendiceal orifice, and                            rectum were photographed. Scope In: 8:09:44 AM Scope Out: 8:28:10 AM Scope Withdrawal Time: 0 hours 9 minutes 8 seconds  Total Procedure Duration: 0 hours 18 minutes 26 seconds  Findings:                 The perianal and digital rectal examinations were                            normal.                           Non-bleeding internal hemorrhoids were found.                           A 4 mm polyp was found in the transverse colon. The  polyp was sessile. The polyp was removed with a                            cold snare. Resection and retrieval were complete.                            Estimated blood loss was minimal.                           There was evidence of a prior side-to-side                            ileo-colonic anastomosis in the ascending colon.                            This was patent and was characterized by healthy                            appearing mucosa. There was a second potential                            lumen located more proximally in the ascending                            colon. On initial appearance it looked to be a deep                            diverticulum, however, the bed of the possible tic                            had the appearance of ileal mucosa. The size of the                            lumen was too small for any meaningful evaluation                             beyond the mouth of the opening.                           The exam was otherwise without abnormality on                            direct and retroflexion views. Complications:            No immediate complications. Estimated Blood Loss:     Estimated blood loss was minimal. Impression:               - Non-bleeding internal hemorrhoids.                           - One 4 mm polyp in the transverse colon, removed                            with a cold snare. Resected  and retrieved.                           - Patent side-to-side ileo-colonic anastomosis,                            characterized by healthy appearing mucosa.                            Potential second anastomosis versus diverticulum in                            the right colon.                           - The examination was otherwise normal on direct                            and retroflexion views. Recommendation:           - Patient has a contact number available for                            emergencies. The signs and symptoms of potential                            delayed complications were discussed with the                            patient. Return to normal activities tomorrow.                            Written discharge instructions were provided to the                            patient.                           - Resume previous diet.                           - Continue present medications.                           - Await pathology results.                           - Repeat colonoscopy is not recommended for                            surveillance due to age.                           - Emerging evidence supports eating a diet of                            fruits, vegetables, grains, calcium, and yogurt  while reducing red meat and alcohol may reduce the                            risk of colon cancer. Thornton Park MD, MD 11/26/2021 8:36:47 AM This report has been signed  electronically.

## 2021-11-26 NOTE — Patient Instructions (Signed)
YOU HAD AN ENDOSCOPIC PROCEDURE TODAY AT THE [facility]:   Refer to the procedure report that was given to you for any specific questions about what was found during the examination.  If the procedure report does not answer your questions, please call your gastroenterologist to clarify.  If you requested that your care partner not be given the details of your procedure findings, then the procedure report has been included in a sealed envelope for you to review at your convenience later.  YOU SHOULD EXPECT: Some feelings of bloating in the abdomen. Passage of more gas than usual.  Walking can help get rid of the air that was put into your GI tract during the procedure and reduce the bloating. If you had a lower endoscopy (such as a colonoscopy or flexible sigmoidoscopy) you may notice spotting of blood in your stool or on the toilet paper. If you underwent a bowel prep for your procedure, you may not have a normal bowel movement for a few days.  Please Note:  You might notice some irritation and congestion in your nose or some drainage.  This is from the oxygen used during your procedure.  There is no need for concern and it should clear up in a day or so.  SYMPTOMS TO REPORT IMMEDIATELY:  Following lower endoscopy (colonoscopy or flexible sigmoidoscopy):  Excessive amounts of blood in the stool  Significant tenderness or worsening of abdominal pains  Swelling of the abdomen that is new, acute  Fever of 100F or higher  For urgent or emergent issues, a gastroenterologist can be reached at any hour by calling [phone]. Do not use MyChart messaging for urgent concerns.    DIET:  We do recommend a small meal at first, but then you may proceed to your regular diet.  Drink plenty of fluids but you should avoid alcoholic beverages for 24 hours.  ACTIVITY:  You should plan to take it easy for the rest of today and you should NOT DRIVE or use heavy machinery until tomorrow (because of  the sedation medicines used during the test).    FOLLOW UP: Our staff will call the number listed on your records the next business day following your procedure.  We will call around 7:15- 8:00 am to check on you and address any questions or concerns that you may have regarding the information given to you following your procedure. If we do not reach you, we will leave a message.     If any biopsies were taken you will be contacted by phone or by letter within the next 1-3 weeks.  Please call us at [phone] if you have not heard about the biopsies in 3 weeks.    SIGNATURES/CONFIDENTIALITY: You and/or your care partner have signed paperwork which will be entered into your electronic medical record.  These signatures attest to the fact that that the information above on your After Visit Summary has been reviewed and is understood.  Full responsibility of the confidentiality of this discharge information lies with you and/or your care-partner.  

## 2021-11-26 NOTE — Progress Notes (Signed)
Patient presents for vaccination against zoster per orders of Dr. Johnson. Consent given. Counseling provided. No contraindications exists. Vaccine administered without incident.   Luke Van Ausdall, PharmD, BCACP, CPP Clinical Pharmacist [facility] [phone]  

## 2021-11-26 NOTE — Progress Notes (Signed)
Referring Provider: Ladell Pier, MD Primary Care Physician:  Ladell Pier, MD  Indication for colonoscopy: Personal history of colon cancer   IMPRESSION:  Personal history of colon cancer: No prior surveillance since resection in 2004 until incomplete colonoscopy 04/23/21 and again 06/30/2021 due to poor prep.   She remains an appropriate candidate for monitored anesthesia care in the endoscopy center.  PLAN: Colonoscopy     HPI: [name] [name] is a 76 y.o. female who presents for colonoscopy.  Perforated gastric ulcer s/p lap omental Graham patch 08/09/2020.  Noted to have liver masses intraoperatively which were biopsied and confirmed to be hepatocellular carcinoma.  She was also found to be positive for HCV.  Her postoperative course was uneventful. Referred by surgery for an EGD to follow-up on her ulcer repair.   EGD 04/23/21 showed gastritis but was otherwise normal. Biopsies were negative for H pylori.  Screening colonoscopy requested by the patient and her daughter was performed at the same time of EGD was limited due to retained stool. The exam showed pancolonic diverticulosis, colonic anastomosis, and a moderate amount of stool.   The poor prep occurred in the setting of chronic constipation despite a two day bowel prep in March.  A repeat attempted colonoscopy in May also was limited due to retained stool.  The patient was seen in the office and it was determined that she remains very motivated to have a surveillance colonoscopy and wanted to do what ever she.  To ensure complete prep.    She returns today ready for colonoscopy.    Past Medical History:  Diagnosis Date   Colon cancer ([place])    Diabetes mellitus without complication ([place])    Glaucoma    Goals of care, counseling/discussion 09/01/2020   Hepatitis C    Hepatocellular carcinoma ([place])    Hypertension     Past Surgical History:  Procedure Laterality Date   COLON RESECTION  2005    In [place] - ?colon cancer   IR IMAGING GUIDED PORT INSERTION  09/18/2020   LAPAROSCOPIC GASTRIC RESECTION N/A 08/09/2020   Procedure: LAPAROSCOPIC EXPLORATION OF ABDOMEN, PERFORATED ULCER REPAIR, LIVER BIOPSY;  Surgeon: Michael Boston, MD;  Location: [facility];  Service: General;  Laterality: N/A;    Current Outpatient Medications  Medication Sig Dispense Refill   brimonidine (ALPHAGAN) 0.2 % ophthalmic solution 1 drop 2 (two) times daily.     gabapentin (NEURONTIN) 300 MG capsule Take 1 capsule (300 mg total) by mouth once nightly at bedtime. 30 capsule 3   glucose blood (TRUE METRIX BLOOD GLUCOSE TEST) test strip Use as instructed 100 each 12   insulin isophane & regular human KwikPen (HUMULIN 70/30 KWIKPEN) (70-30) 100 UNIT/ML KwikPen INJECT 30-40 units subcutANEOUSLY EVERY MORNING and 5-12 units EVERY EVENING 15 mL 11   insulin lispro (HUMALOG) 100 UNIT/ML injection SSI with meals: 200-250 3 units, 251-300 6 units, 301-350 9 units, 351-400 12 units, 401-450 15 units, > 451 18 units under the skin as directed 10 mL 2   insulin NPH-regular Human (70-30) 100 UNIT/ML injection Inject 30-40 units into the skin once every morning and 5-12 units once every evening. 10 mL 0   Insulin Pen Needle 31G X 6 MM MISC use as directed 100 each 6   linaclotide (LINZESS) 145 MCG CAPS capsule Take 1 capsule (145 mcg total) by mouth once daily before breakfast. (Office visit for further refills) 30 capsule 2   losartan (COZAAR) 50 MG tablet Take 1 tablet (  50 mg total) by mouth at bedtime. 30 tablet 6   NIFEdipine (PROCARDIA-XL/NIFEDICAL-XL) 30 MG 24 hr tablet Take 1 tablet (30 mg total) by mouth in the morning and at bedtime. MUST HAVE OFFICE VISIT FOR REFILLS 180 tablet 0   pantoprazole (PROTONIX) 40 MG tablet Take 1 tablet (40 mg total) by mouth 2 (two) times daily. (Patient taking differently: Take 40 mg by mouth daily.) 180 tablet 3   predniSONE (DELTASONE) 5 MG tablet take 1 tablet by mouth daily for 2 weeks  then half a tablet daily for 2 weeks. 21 tablet 0   silver sulfADIAZINE (SILVADENE) 1 % cream Apply to the open, ulcerated areas of the skin once daily as needed. 400 g 1   Sofosbuvir-Velpatasvir (EPCLUSA) 400-100 MG TABS Take 1 tablet by mouth daily. 30 tablet 2   triamcinolone cream (KENALOG) 0.1 % Apply to the itchy and irritated areas of the skin of the back, forearms, hands, legs and feet daily as needed 454 g 1   betamethasone dipropionate 0.05 % cream Apply topically 2 (two) times daily. (Patient not taking: Reported on 11/20/2021) 30 g 0   mupirocin ointment (BACTROBAN) 2 % Apply once a day to biopsy portion of left foot (Patient not taking: Reported on 11/20/2021) 22 g 2   polyethylene glycol powder (GLYCOLAX/MIRALAX) 17 GM/SCOOP powder Take 17 g by mouth daily as needed. (Patient not taking: Reported on 11/20/2021)     Current Facility-Administered Medications  Medication Dose Route Frequency Provider Last Rate Last Admin   0.9 %  sodium chloride infusion  500 mL Intravenous Once Thornton Park, MD       Facility-Administered Medications Ordered in Other Visits  Medication Dose Route Frequency Provider Last Rate Last Admin   sodium chloride flush (NS) 0.9 % injection 10 mL  10 mL Intravenous PRN Celso Amy, NP   10 mL at 12/18/20 1009    Allergies as of 11/26/2021 - Review Complete 11/26/2021  Allergen Reaction Noted   Bactrim [sulfamethoxazole-trimethoprim] Swelling 08/08/2020   Clarithromycin Swelling and Rash 09/01/2020    Family History  Problem Relation Age of Onset   Cancer Father        type unknown, was on the leg   Other Daughter        Acoustic neuroma   Breast cancer Neg Hx    Colon cancer Neg Hx    Esophageal cancer Neg Hx    Rectal cancer Neg Hx    Stomach cancer Neg Hx     Physical Exam: Vital signs were reviewed. General:   Alert, well-nourished, pleasant and cooperative in NAD.  No pallor. Head:  Normocephalic and atraumatic.  No alopecia. Eyes:   Sclera clear, no icterus.   Conjunctiva pink. Mouth:  No deformity or lesions.  No atrophic glossitis. No chelitis. Neck:  Supple; no thyromegaly. Lungs:  Clear throughout to auscultation.   No wheezes.  Heart:  Regular rate and rhythm; no murmurs Abdomen:  Soft, nontender, normal bowel sounds. No rebound or guarding. No hepatosplenomegaly Rectal:  Deferred  Msk:  Symmetrical without gross deformities. Extremities:  No gross deformities or edema. Neurologic:  Alert and  oriented x4;  grossly nonfocal Skin:  No rash or bruise. Psych:  Alert and cooperative. Normal mood and affect.     Sophiana Milanese L. Tarri Glenn, MD, MPH 11/26/2021, 8:04 AM

## 2021-11-26 NOTE — Progress Notes (Signed)
Called to room to assist during endoscopic procedure.  Patient ID and intended procedure confirmed with present staff. Received instructions for my participation in the procedure from the performing physician.  

## 2021-11-26 NOTE — Progress Notes (Signed)
Report given to PACU, vss 

## 2021-11-27 ENCOUNTER — Other Ambulatory Visit: Payer: Self-pay

## 2021-11-27 ENCOUNTER — Telehealth: Payer: Self-pay | Admitting: *Deleted

## 2021-11-27 NOTE — Telephone Encounter (Signed)
  Follow up Call-     11/26/2021    7:21 AM 06/30/2021    7:46 AM 04/23/2021   10:27 AM  Call back number  Post procedure Call Back phone  # [phone]- daughter  Permission to leave phone message Yes Yes Yes     Patient questions:  Do you have a fever, pain , or abdominal swelling? No. Pain Score  0 *  Have you tolerated food without any problems? Yes.    Have you been able to return to your normal activities? Yes.    Do you have any questions about your discharge instructions: Diet   No. Medications  No. Follow up visit  No.  Do you have questions or concerns about your Care? No.  Actions: * If pain score is 4 or above: No action needed, pain <4.

## 2021-11-30 ENCOUNTER — Other Ambulatory Visit: Payer: Self-pay | Admitting: Internal Medicine

## 2021-11-30 ENCOUNTER — Encounter: Payer: Self-pay | Admitting: Internal Medicine

## 2021-11-30 MED ORDER — PREDNISONE 5 MG PO TABS
ORAL_TABLET | ORAL | 0 refills | Status: DC
  Filled 2021-11-30: qty 7, 14d supply, fill #0

## 2021-12-01 ENCOUNTER — Encounter: Payer: Self-pay | Admitting: Hematology & Oncology

## 2021-12-01 ENCOUNTER — Encounter: Payer: Self-pay | Admitting: Gastroenterology

## 2021-12-01 ENCOUNTER — Other Ambulatory Visit: Payer: Self-pay

## 2021-12-02 ENCOUNTER — Other Ambulatory Visit: Payer: Self-pay

## 2021-12-02 ENCOUNTER — Encounter: Payer: Self-pay | Admitting: Internal Medicine

## 2021-12-02 ENCOUNTER — Ambulatory Visit (INDEPENDENT_AMBULATORY_CARE_PROVIDER_SITE_OTHER): Payer: Self-pay | Admitting: Internal Medicine

## 2021-12-02 VITALS — BP 156/78 | HR 94 | Temp 98.5°F | Ht 64.0 in | Wt 161.0 lb

## 2021-12-02 DIAGNOSIS — R188 Other ascites: Secondary | ICD-10-CM

## 2021-12-02 DIAGNOSIS — C220 Liver cell carcinoma: Secondary | ICD-10-CM

## 2021-12-02 DIAGNOSIS — K7460 Unspecified cirrhosis of liver: Secondary | ICD-10-CM

## 2021-12-02 DIAGNOSIS — B182 Chronic viral hepatitis C: Secondary | ICD-10-CM

## 2021-12-02 NOTE — Patient Instructions (Signed)
Finish your epclusa  I will order hepatitis c viral load for 05/2022 and you can do that then follow up with me 2 weeks after   If your rash is worse, you'll need to discuss with dermatology and your oncology doctor again about nivolumab use or increasing prednisone which is never good long term

## 2021-12-02 NOTE — Progress Notes (Signed)
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   Hypercalcemia 09/22/2020   Chronic hepatitis C with hepatic coma ([place]) 09/22/2020   Goals of care, counseling/discussion 09/01/2020   Hepatocellular carcinoma ([place]) 08/14/2020   Hyperglycemia 08/10/2020   Pneumoperitoneum 08/09/2020   History of colorectal cancer 08/09/2020   Liver mass, left lobe 08/09/2020   Insulin-requiring or dependent type II diabetes mellitus ([place]) 08/09/2020   Hypertension associated with diabetes ([place]) 08/09/2020   Constipation, chronic 08/09/2020   Perforated gastric ulcer s/p lap omental [facility] patch 08/09/2020 08/09/2020      Subjective:    Patient ID: [name] [name], female    DOB: 1945/08/21, 76 y.o.   MRN: [id number]  Chief Complaint  Patient presents with   Follow-up      HPI:  [name] [name] is a 76 y.o. female with hcc/chronic hep c, here for evaluation for hep c treatment  She is known to our clinic. When she was first evaluated a year ago, decision to treat hep c was deferred given her active bulky [place]  She has been undergoing hcc treatment with oncology via immune therapy and her lesion has improved significantly on 02/2021 mra. There is a plan to repeat mra in 05/2021  She is feeling well. No fatigue, ascites, blood/melena per rectum, joint pain, skin rash  She has mild-moderate lft elevation that is stable  Of note, the mra did  mention sign of cirrhosis and portal hypertension with trace ascites She has egd 3/09 which saw gastritis but no varices. Colonoscopy same day also no varices except sigmoid diverticulosis  She never needed paracentesis or uses diuretics for hx of ascites. No hx gib -------------------- 06/17/21 id f/u Checkpoint inhibitors hold due to lft up -- assymptomatic Feels well/baseline Dr Marin Olp wants to treat her for hep c -- recent repeat mri  stable/improved but still evidence of hcc    08/20/21 id f/u She has started epclusa 6 months planned About 4-5 weeks into epclusa developed a burning/itch rash started on feet then spread centrally Started with a bump then enlarge into vessicle/blister/papule and then can coalesce and form large blisters No fever, chill No arthralgia No myalgia No headache No visual change No hematuria No abd pain  She was given abx for cellulitis without help She was given topical steroid for thought of dishydrosis but didn't help  Where the lesions coalesced she has pain and keeping her awake   8/31 id clinic Patient's rash biopsied and seen by derm. Rash lichenoid dermatosis. Nivolumab was stopped/steroid started with significant improvement/resolution. She is very happy  She had been continuing epclusa without problem She is on pred 10 mg at this time daily  Oncology had just restarted nivolumab  Tolerating epclusa. Finishing 4th month  Recent labs from oncology indicate normal lft    12/02/21 id clinic Patient doing well. Is into her 1st week of the 6th month of epclusa She was restarted on her nivolumab august 2023 Her rashed was improving on prednisone while kept on epclusa. She now reported new bumps/rash in her feet about 2-3 weeks ago when she went down from 5 mg to 2.5 mg pred a day  No other complaint Planned on going to [place] 01/2022, during which she'll be temporarily on sorafenib by oncology for her hcc until she gets back (3 month trip planned) and she'll be back on nivolumab.  Allergies  Allergen Reactions   Bactrim [Sulfamethoxazole-Trimethoprim] Swelling   Clarithromycin Swelling and Rash    Daughter is unsure if Clarithromycin or Amoxicillin caused rash and swelling.  Patient states no allergy to amoxicillan - has taken this recently with no issues. (12/02/21)      Outpatient Medications Prior to Visit  Medication Sig Dispense Refill   betamethasone  dipropionate 0.05 % cream Apply topically 2 (two) times daily. (Patient not taking: Reported on 11/20/2021) 30 g 0   brimonidine (ALPHAGAN) 0.2 % ophthalmic solution 1 drop 2 (two) times daily.     gabapentin (NEURONTIN) 300 MG capsule Take 1 capsule (300 mg total) by mouth once nightly at bedtime. 30 capsule 3   glucose blood (TRUE METRIX BLOOD GLUCOSE TEST) test strip Use as instructed 100 each 12   insulin isophane & regular human KwikPen (HUMULIN 70/30 KWIKPEN) (70-30) 100 UNIT/ML KwikPen INJECT 30-40 units subcutANEOUSLY EVERY MORNING and 5-12 units EVERY EVENING 15 mL 11   insulin lispro (HUMALOG) 100 UNIT/ML injection SSI with meals: 200-250 3 units, 251-300 6 units, 301-350 9 units, 351-400 12 units, 401-450 15 units, > 451 18 units under the skin as directed 10 mL 2   insulin NPH-regular Human (70-30) 100 UNIT/ML injection Inject 30-40 units into the skin once every morning and 5-12 units once every evening. 10 mL 0   Insulin Pen Needle 31G X 6 MM MISC use as directed 100 each 6   linaclotide (LINZESS) 145 MCG CAPS capsule Take 1 capsule (145 mcg total) by mouth once daily before breakfast. (Office visit for further refills) 30 capsule 2   losartan (COZAAR) 50 MG tablet Take 1 tablet (50 mg total) by mouth at bedtime. 30 tablet 6   mupirocin ointment (BACTROBAN) 2 % Apply once a day to biopsy portion of left foot (Patient not taking: Reported on 11/20/2021) 22 g 2   NIFEdipine (PROCARDIA-XL/NIFEDICAL-XL) 30 MG 24 hr tablet Take 1 tablet (30 mg total) by mouth in the morning and at bedtime. MUST HAVE OFFICE VISIT FOR REFILLS 180 tablet 0   pantoprazole (PROTONIX) 40 MG tablet Take 1 tablet (40 mg total) by mouth 2 (two) times daily. (Patient taking differently: Take 40 mg by mouth daily.) 180 tablet 3   polyethylene glycol powder (GLYCOLAX/MIRALAX) 17 GM/SCOOP powder Take 17 g by mouth daily as needed. (Patient not taking: Reported on 11/20/2021)     predniSONE (DELTASONE) 5 MG tablet Take 0.5  tablet by mouth daily for 2 weeks. 7 tablet 0   silver sulfADIAZINE (SILVADENE) 1 % cream Apply to the open, ulcerated areas of the skin once daily as needed. 400 g 1   Sofosbuvir-Velpatasvir (EPCLUSA) 400-100 MG TABS Take 1 tablet by mouth daily. 30 tablet 2   triamcinolone cream (KENALOG) 0.1 % Apply to the itchy and irritated areas of the skin of the back, forearms, hands, legs and feet daily as needed 454 g 1   Facility-Administered Medications Prior to Visit  Medication Dose Route Frequency Provider Last Rate Last Admin   sodium chloride flush (NS) 0.9 % injection 10 mL  10 mL Intravenous PRN Celso Amy, NP   10 mL at 12/18/20 1009     Social History   Socioeconomic History   Marital status: Widowed    Spouse name: Not on file   Number of children: 5   Years of education: 14   Highest education level: Associate degree: occupational, Hotel manager, or vocational program  Occupational History   Not on file  Tobacco Use   Smoking status: Never   Smokeless tobacco: Never  Vaping Use   Vaping Use: Never used  Substance and Sexual Activity   Alcohol use: Not Currently   Drug use: Never   Sexual activity: Not Currently  Other Topics Concern   Not on file  Social History Narrative   Originally from [place].  Speaks [place] and [place]   Social Determinants of Radio broadcast assistant Strain: Not on file  Food Insecurity: Not on file  Transportation Needs: Not on file  Physical Activity: Not on file  Stress: Not on file  Social Connections: Not on file  Intimate Partner Violence: Not on file      Review of Systems    All other ros negative  Objective:    BP (!) 156/78   Pulse 94   Temp 98.5 F (36.9 C) (Oral)   Ht '5\' 4"'$  (1.626 m)   Wt 161 lb (73 kg)   SpO2 97%   BMI 27.64 kg/m  Nursing note and vital signs reviewed.  Physical Exam     General/constitutional: no distress, pleasant HEENT: Normocephalic, PER, Conj Clear, EOMI, Oropharynx clear Neck  supple CV: rrr no mrg Lungs: clear to auscultation, normal respiratory effort Abd: Soft, Nontender Ext: no edema Skin: no open sore; posterior feet papules/nodular lesions that are new Neuro: nonfocal MSK: no peripheral joint swelling/tenderness/warmth; back spines nontender  Labs: Lab Results  Component Value Date   WBC 5.4 11/20/2021   HGB 13.3 11/20/2021   HCT 40.0 11/20/2021   MCV 89.5 11/20/2021   PLT 163 47/82/9562   Last metabolic panel Lab Results  Component Value Date   GLUCOSE 135 (H) 11/20/2021   NA 141 11/20/2021   K 3.6 11/20/2021   CL 107 11/20/2021   CO2 27 11/20/2021   BUN 8 11/20/2021   CREATININE 0.61 11/20/2021   GFRNONAA >60 11/20/2021   CALCIUM 10.6 (H) 11/20/2021   PHOS 2.5 08/10/2020   PHOS 2.5 08/10/2020   PROT 7.3 11/20/2021   ALBUMIN 4.0 11/20/2021   BILITOT 0.6 11/20/2021   ALKPHOS 158 (H) 11/20/2021   AST 21 11/20/2021   ALT 30 11/20/2021   ANIONGAP 7 11/20/2021   No results found for: "INR", "PROTIME"   Micro:  Serology:  Imaging: 05/30/21 mri liver 1. Continued interval reduction in size of a dominant mass of hepatic segment III as well as additional smaller masses, consistent continued treatment response of biopsy proven hepatocellular carcinoma. 2. Unchanged subcentimeter arterially hyperenhancing focus of the liver dome, although distinct in enhancement characteristics from other multifocal hepatocellular carcinoma lesions, of intermediate suspicion for hepatocellular carcinoma. This is a LI-RADS category 3 lesion if so characterized. Attention on follow-up. 3. No new suspicious lesions identified. 4. Trace ascites. 5. Cholelithiasis.   02/19/21 mr abdomen 1. Marked interval response to therapy with diminished size of all visible lesions, near complete resolution of most lesions aside from the largest lesion which likely a small amount of residual viable tumor. 2. No new lesions. 3. Signs of cirrhosis and portal  hypertension. 4. Cholelithiasis. 5. Diminished ascites with only trace ascites and with resolution of third spacing that was seen previously.    Assessment & Plan:   Problem List Items Addressed This Visit       Digestive   Chronic hepatitis C with hepatic coma ([place])   Relevant Orders   Hepatitis C RNA quantitative ([facility])   Other Visit Diagnoses     Cirrhosis of liver with ascites,  unspecified hepatic cirrhosis type ([place])    -  Primary   Relevant Orders   Hepatitis C RNA quantitative ([facility])   Hepatoma ([place])       Relevant Orders   Hepatitis C RNA quantitative ([facility])     Reviewed natural hx of chronic hep c Reviewed treatment benefit and treatment initiation/timing in special cases like her who has high burden nonresectable hcc  Currently while there is potentially association with hep c and hcc carcinogenesis, it is unclear if not treating hep c will decrease chance of cure of hcc. The reverse seems to be true (with active hcc, the rate of cure of hep c with DAA is much lower). As she only has 2 shots at DAA I would like to see her at least stable from [facility] standpoint in terms of cure for 3-6 months before initiating DAA  I agree that treating hep C successfully will reduce the [facility] recurrent rate and also improve all-cause mortality for her   --------------------- 06/17/21 assessment Patient from malignancy standpoint stable but evidence of disease I agree with oncology regarding the recent uptick in lft likely related to checkpoint inhibitor  Will need hcv genotype/rna quant for insurance authorization  Given cirhosis/decompensation (ascites) will plan 24 weeks of epclusa. She is unlikely a good candidate for interferon co-treatment  She needs to see ongoing GI follow up for routine variceal monitoring/ablation as needed   -labs as discussed -refer to pharmacy to discuss epclusa treamtent -- 24 weeks plan -see me again no earlier than 3 months after the 24 weeks of  epclusa done. -no need for elastography   -discussed natural progression of hep c, transmission (avoid sharing personal hygiene equipment) -discussed avoid toxin like etoh and excessive acetamaminphen (no more than 2 gram a day) -discussed healthy life style and good glucose control -discussed avoiding eating raw sea food -discussed we can treat hep c but can be reinfected -discussed hepatitis coinfection and vaccination  ------------- 08/20/21 assessment Rash suspect cryo vs lcv vasculitis like rash Doesn't appear to be cutaneous porphyria I do not think this is epclusa drug rash  Will check rpr/syphilis and gc/chlam (keratoderma blenorrhagicum)  Continue epclusa Refer to dermatology dr Orlene Och Stop abx/topical steroid Cryoglobulin Anca  Ibuprofen as needed for pain  ----------- 8/31 assessment Rash resolved on prednisone and off nivolumab (which was just restarted by oncology) Explained to her again the dermatitis of nivolumab can occur any time while on nivolumab if that was cause Doesn't appear rash was due to epclusa   She has 2 and a half month left of epclusa for 6 months tx in setting decompensated cirrhosis  Labs done recently looks good   See me 8 weeks and then again in 05/2022 for test of cure testing  I have spent a total of 20 minutes of face-to-face and non-face-to-face time, excluding clinical staff time, preparing to see patient, ordering tests and/or medications, and provide counseling the patient   12/02/21 id assessment Doing well from hep c standpoint. Due to virologic testing 05/2022. Has 3 more weeks left of epclusa Started back on nivolumab 2-3 months prior and now there is new rash; also pred tapered down to 2.5 mg from 5 mg around that time but unlikely this was the impetus  If rash worse 2 ways to go about --> increased pred vs getting off nivolumab again (the latter might be tricky because her hcc has responded well to that)  Last  blood work from earlier this month  normal lft     Follow-up: Return in about 6 months (around 06/03/2022).      Jabier Mutton, [facility] [facility] [place] [phone]  pager   [phone] cell 12/02/2021, 3:25 PM

## 2021-12-03 ENCOUNTER — Other Ambulatory Visit: Payer: Self-pay

## 2021-12-03 ENCOUNTER — Encounter: Payer: Self-pay | Admitting: Hematology & Oncology

## 2021-12-03 ENCOUNTER — Ambulatory Visit (INDEPENDENT_AMBULATORY_CARE_PROVIDER_SITE_OTHER): Payer: Self-pay | Admitting: Student

## 2021-12-03 VITALS — BP 127/70 | HR 70 | Temp 98.3°F | Ht 64.0 in | Wt 162.2 lb

## 2021-12-03 DIAGNOSIS — L280 Lichen simplex chronicus: Secondary | ICD-10-CM

## 2021-12-03 MED ORDER — TRIAMCINOLONE ACETONIDE 0.1 % EX OINT
1.0000 | TOPICAL_OINTMENT | Freq: Two times a day (BID) | CUTANEOUS | 1 refills | Status: DC
  Filled 2021-12-03: qty 80, 40d supply, fill #0

## 2021-12-03 MED ORDER — PREDNISONE 5 MG PO TABS
5.0000 mg | ORAL_TABLET | Freq: Every day | ORAL | 0 refills | Status: AC
  Filled 2021-12-03: qty 21, 21d supply, fill #0

## 2021-12-03 NOTE — Patient Instructions (Signed)
It was great to see you today! Thank you for choosing [facility] for your primary care. [facility] [name] was seen for lichenoid dermatitis.  Today we addressed: Prednisone '5mg'$  daily. Triamcinolone ointment 1-2x daily over active lesions. After 3 weeks when you finish your Epclusa medication, you may taper down to 2.'5mg'$  daily of prednisone (from you current medication regimen). Please make an appointment with your PCP in the next couple weeks. If this resolves and does not come back after finishing Epclusa, great! If it does come back, please make an appointment with your established Dermatologist. We are family medicine and would advise you see dermatology.  If you haven't already, sign up for My Chart to have easy access to your labs results, and communication with your primary care physician.  Please arrive 15 minutes before your appointment to ensure smooth check in process.  We appreciate your efforts in making this happen.  Thank you for allowing me to participate in your care, Wells Guiles, DO 12/03/2021, 4:03 PM PGY-2, [place]

## 2021-12-03 NOTE — Assessment & Plan Note (Addendum)
Worsening with most recent tapered prednisone dosage. Origin is still unknown but likely related to Hepatitis C or HCC (disease or Epclusa therapy). Increase steroid to '5mg'$  daily until finishing Epclusa therapy then may taper therapy again. Further Triamcinolone ointment for better topical control. Advise reevaluation for stability in the next 2-3 weeks with PCP. Should this persist, advise follow up with Dermatology (not Family Medicine). Her Dermatologist is Dr. Juliet Rude.

## 2021-12-03 NOTE — Progress Notes (Signed)
  SUBJECTIVE:   CHIEF COMPLAINT / HPI:   Lichenoid dermatitis: Follow up today. Presents with daughter who states they received call last week to set up appointment, she thought we were not dermatology but was ensured we are. They established with a dermatologist outside of [facility]. She was on prednisone '40mg'$  daily (prescribed by PCP) and tapered down until now 2.'5mg'$  daily, plan approved by dermatology. Many of team members involved believe rash to be related to Epclusa medication, except Oncology. The lesions resolved entirely while on higher dose steroids but since being on 2.'5mg'$ , she has started to redevelop a couple more hard nodules on her right heel and other portions of her extremities. She finishes Epclusa therapy in 3 weeks. Notes that in the last couple days, she has started to develop a couple of the harder nodules on her right heel and legs bilaterally. These usually progress to open lesions which open her up for infection risk.   PERTINENT  PMH / PSH: Hepatocellular carcinoma, hepatitis C  OBJECTIVE:  BP 127/70   Pulse 70   Temp 98.3 F (36.8 C)   Ht '5\' 4"'$  (1.626 m)   Wt 162 lb 3.2 oz (73.6 kg)   SpO2 97%   BMI 27.84 kg/m  Gen: well appearing, NAD Skin: scattered hyperpigmentation over upper and lower extremities; hyperpigmented raised, lichenification appearing as nodules over right heel, patellar surface, and left foot; no open wounds or drainage  ASSESSMENT/PLAN:  Lichenoid dermatitis Assessment & Plan: Worsening with most recent tapered prednisone dosage. Origin is still unknown but likely related to Hepatitis C or HCC (disease or Epclusa therapy). Increase steroid to '5mg'$  daily until finishing Epclusa therapy then may taper therapy again. Further Triamcinolone ointment for better topical control. Advise reevaluation for stability in the next 2-3 weeks with PCP. Should this persist, advise follow up with Dermatology (not Family Medicine). Her Dermatologist is Dr. Juliet Rude.  Orders: -     predniSONE; Take 1 tablet (5 mg total) by mouth daily with breakfast for 21 days.  Dispense: 21 tablet; Refill: 0 -     Triamcinolone Acetonide; Apply 1 Application topically 2 (two) times daily.  Dispense: 80 g; Refill: Experiment, DO 12/03/2021, 4:19 PM PGY-2, [place]

## 2021-12-07 ENCOUNTER — Ambulatory Visit: Payer: Self-pay | Admitting: Internal Medicine

## 2021-12-09 ENCOUNTER — Other Ambulatory Visit: Payer: Self-pay | Admitting: Internal Medicine

## 2021-12-09 ENCOUNTER — Encounter: Payer: Self-pay | Admitting: Hematology & Oncology

## 2021-12-09 ENCOUNTER — Other Ambulatory Visit: Payer: Self-pay

## 2021-12-09 MED ORDER — NIFEDIPINE ER OSMOTIC RELEASE 30 MG PO TB24
30.0000 mg | ORAL_TABLET | Freq: Two times a day (BID) | ORAL | 0 refills | Status: DC
  Filled 2021-12-09: qty 60, 30d supply, fill #0
  Filled 2022-01-06: qty 60, 30d supply, fill #1
  Filled 2022-02-11: qty 60, 30d supply, fill #2

## 2021-12-14 ENCOUNTER — Other Ambulatory Visit: Payer: Self-pay

## 2021-12-18 ENCOUNTER — Telehealth: Payer: Self-pay | Admitting: *Deleted

## 2021-12-18 ENCOUNTER — Inpatient Hospital Stay: Payer: Self-pay

## 2021-12-18 ENCOUNTER — Encounter: Payer: Self-pay | Admitting: Hematology & Oncology

## 2021-12-18 ENCOUNTER — Inpatient Hospital Stay (HOSPITAL_BASED_OUTPATIENT_CLINIC_OR_DEPARTMENT_OTHER): Payer: Self-pay | Admitting: Hematology & Oncology

## 2021-12-18 ENCOUNTER — Inpatient Hospital Stay: Payer: Self-pay | Attending: Hematology & Oncology

## 2021-12-18 VITALS — BP 123/67 | HR 78 | Temp 97.7°F | Resp 18 | Wt 162.0 lb

## 2021-12-18 DIAGNOSIS — C220 Liver cell carcinoma: Secondary | ICD-10-CM

## 2021-12-18 DIAGNOSIS — B1920 Unspecified viral hepatitis C without hepatic coma: Secondary | ICD-10-CM | POA: Insufficient documentation

## 2021-12-18 DIAGNOSIS — Z5112 Encounter for antineoplastic immunotherapy: Secondary | ICD-10-CM | POA: Insufficient documentation

## 2021-12-18 LAB — CMP (CANCER CENTER ONLY)
ALT: 29 U/L (ref 0–44)
AST: 21 U/L (ref 15–41)
Albumin: 4 g/dL (ref 3.5–5.0)
Alkaline Phosphatase: 138 U/L — ABNORMAL HIGH (ref 38–126)
Anion gap: 5 (ref 5–15)
BUN: 18 mg/dL (ref 8–23)
CO2: 27 mmol/L (ref 22–32)
Calcium: 10.2 mg/dL (ref 8.9–10.3)
Chloride: 107 mmol/L (ref 98–111)
Creatinine: 0.63 mg/dL (ref 0.44–1.00)
GFR, Estimated: >60 mL/min (ref >60)
Glucose, Bld: 146 mg/dL — ABNORMAL HIGH (ref 70–99)
Potassium: 3.7 mmol/L (ref 3.5–5.1)
Sodium: 139 mmol/L (ref 135–145)
Total Bilirubin: 0.6 mg/dL (ref 0.3–1.2)
Total Protein: 7.2 g/dL (ref 6.5–8.1)

## 2021-12-18 LAB — CBC WITH DIFFERENTIAL (CANCER CENTER ONLY)
Abs Immature Granulocytes: 0.05 10*3/uL (ref 0.00–0.07)
Basophils Absolute: 0 10*3/uL (ref 0.0–0.1)
Basophils Relative: 1 %
Eosinophils Absolute: 0.1 10*3/uL (ref 0.0–0.5)
Eosinophils Relative: 1 %
HCT: 38.8 % (ref 36.0–46.0)
Hemoglobin: 12.8 g/dL (ref 12.0–15.0)
Immature Granulocytes: 1 %
Lymphocytes Relative: 37 %
Lymphs Abs: 2.1 10*3/uL (ref 0.7–4.0)
MCH: 29.4 pg (ref 26.0–34.0)
MCHC: 33 g/dL (ref 30.0–36.0)
MCV: 89 fL (ref 80.0–100.0)
Monocytes Absolute: 0.5 10*3/uL (ref 0.1–1.0)
Monocytes Relative: 8 %
Neutro Abs: 3 10*3/uL (ref 1.7–7.7)
Neutrophils Relative %: 52 %
Platelet Count: 178 10*3/uL (ref 150–400)
RBC: 4.36 MIL/uL (ref 3.87–5.11)
RDW: 14.6 % (ref 11.5–15.5)
WBC Count: 5.7 10*3/uL (ref 4.0–10.5)
nRBC: 0 % (ref 0.0–0.2)

## 2021-12-18 LAB — LACTATE DEHYDROGENASE: LDH: 180 U/L (ref 98–192)

## 2021-12-18 MED ORDER — SODIUM CHLORIDE 0.9 % IV SOLN: Freq: Once | INTRAVENOUS | Status: DC

## 2021-12-18 MED ORDER — HEPARIN SOD (PORK) LOCK FLUSH 100 UNIT/ML IV SOLN
500.0000 [IU] | Freq: Once | INTRAVENOUS | Status: AC | PRN
  Administered 2021-12-18: 500 [IU]

## 2021-12-18 MED ORDER — SODIUM CHLORIDE 0.9 % IV SOLN
480.0000 mg | Freq: Once | INTRAVENOUS | Status: AC
  Administered 2021-12-18: 480 mg via INTRAVENOUS
  Filled 2021-12-18: qty 48

## 2021-12-18 MED ORDER — SODIUM CHLORIDE 0.9% FLUSH
10.0000 mL | INTRAVENOUS | Status: DC | PRN
  Administered 2021-12-18: 10 mL

## 2021-12-18 NOTE — Progress Notes (Signed)
Hematology and Oncology Follow Up Visit  [name] [name] [id number] May 24, 1945 76 y.o. 12/18/2021   Principle Diagnosis:  Hepatocellular carcinoma-multifocal Hepatitis C   Current Therapy:        Status post cycle 1 of nivolumab/ipilimumab -- d/c on 10/13/2020 due to hepatic toxicity Nivolumab 480 mg IV every 4 weeks - s/p cycle #19 -- started 11/21/2020 --changed to 480 mg on 08/2021   Interim History:  [name] is here today for follow-up and treatment.  He is almost done with treatment for her hepatitis C.  Her steroids have been decreased.  However, when the steroid dose went down to 2.5 mg daily, she had more of a break out on her legs and feet.  The steroid dose was upped to 5 mg.  This is helped.  She is good to go over to [place] and mid December.  This weekend, she will be going out to [place] to visit some friends.  She had her last alpha-fetoprotein of 6.3.  This is holding nice and stable.  She has had no problems with cough or shortness of breath.  There is no diarrhea.  She has had no bleeding.  She has had no fever.  She has had no headache.  There is been no nausea or vomiting.  Overall, I would say performance status is probably ECOG 1.     Medications:  Allergies as of 12/18/2021       Reactions   Bactrim [sulfamethoxazole-trimethoprim] Swelling   Clarithromycin Swelling, Rash   Daughter is unsure if Clarithromycin or Amoxicillin caused rash and swelling. Patient states no allergy to amoxicillan - has taken this recently with no issues. (12/02/21)        Medication List        Accurate as of December 18, 2021  8:41 AM. If you have any questions, ask your nurse or doctor.          brimonidine 0.2 % ophthalmic solution Commonly known as: ALPHAGAN 1 drop 2 (two) times daily.   gabapentin 300 MG capsule Commonly known as: NEURONTIN Take 1 capsule (300 mg total) by mouth once nightly at bedtime.   HumaLOG 100 UNIT/ML  injection Generic drug: insulin lispro SSI with meals: 200-250 3 units, 251-300 6 units, 301-350 9 units, 351-400 12 units, 401-450 15 units, > 451 18 units under the skin as directed   HumuLIN 70/30 KwikPen (70-30) 100 UNIT/ML KwikPen Generic drug: insulin isophane & regular human KwikPen INJECT 30-40 units subcutANEOUSLY EVERY MORNING and 5-12 units EVERY EVENING   HumuLIN 70/30 (70-30) 100 UNIT/ML injection Generic drug: insulin NPH-regular Human Inject 30-40 units into the skin once every morning and 5-12 units once every evening.   linaclotide 145 MCG Caps capsule Commonly known as: Linzess Take 1 capsule (145 mcg total) by mouth once daily before breakfast. (Office visit for further refills)   losartan 50 MG tablet Commonly known as: COZAAR Take 1 tablet (50 mg total) by mouth at bedtime.   mupirocin ointment 2 % Commonly known as: BACTROBAN Apply once a day to biopsy portion of left foot   NIFEdipine 30 MG 24 hr tablet Commonly known as: PROCARDIA-XL/NIFEDICAL-XL Take 1 tablet (30 mg total) by mouth in the morning and at bedtime.   pantoprazole 40 MG tablet Commonly known as: Protonix Take 1 tablet (40 mg total) by mouth 2 (two) times daily. What changed: when to take this   polyethylene glycol powder 17 GM/SCOOP powder Commonly known as: GLYCOLAX/MIRALAX Take 17 g  by mouth daily as needed.   predniSONE 5 MG tablet Commonly known as: DELTASONE Take 1 tablet (5 mg total) by mouth daily with breakfast for 21 days.   Sofosbuvir-Velpatasvir 400-100 MG Tabs Commonly known as: Epclusa Take 1 tablet by mouth daily.   SSD 1 % cream Generic drug: silver sulfADIAZINE Apply to the open, ulcerated areas of the skin once daily as needed.   triamcinolone ointment 0.1 % Commonly known as: KENALOG Apply 1 Application topically 2 (two) times daily.   True Metrix Blood Glucose Test test strip Generic drug: glucose blood Use as instructed   TRUEplus 5-Bevel Pen Needles  31G X 6 MM Misc Generic drug: Insulin Pen Needle use as directed        Allergies:  Allergies  Allergen Reactions   Bactrim [Sulfamethoxazole-Trimethoprim] Swelling   Clarithromycin Swelling and Rash    Daughter is unsure if Clarithromycin or Amoxicillin caused rash and swelling.  Patient states no allergy to amoxicillan - has taken this recently with no issues. (12/02/21)    Past Medical History, Surgical history, Social history, and Family History were reviewed and updated.  Review of Systems: Review of Systems  Constitutional: Negative.   HENT: Negative.    Eyes: Negative.   Respiratory: Negative.    Cardiovascular: Negative.   Gastrointestinal: Negative.   Genitourinary: Negative.   Musculoskeletal: Negative.   Skin: Negative.   Neurological:  Positive for tingling.  Endo/Heme/Allergies: Negative.   Psychiatric/Behavioral: Negative.       Physical Exam:  weight is 162 lb (73.5 kg). Her oral temperature is 97.7 F (36.5 C). Her blood pressure is 123/67 and her pulse is 78. Her respiration is 18 and oxygen saturation is 95%.   Wt Readings from Last 3 Encounters:  12/18/21 162 lb (73.5 kg)  12/03/21 162 lb 3.2 oz (73.6 kg)  12/02/21 161 lb (73 kg)    Physical Exam Vitals reviewed.  HENT:     Head: Normocephalic and atraumatic.  Eyes:     Pupils: Pupils are equal, round, and reactive to light.  Cardiovascular:     Rate and Rhythm: Normal rate and regular rhythm.     Heart sounds: Normal heart sounds.  Pulmonary:     Effort: Pulmonary effort is normal.     Breath sounds: Normal breath sounds.  Abdominal:     General: Bowel sounds are normal.     Palpations: Abdomen is soft.     Comments: Abdominal exam shows a well-healed laparotomy scar.  She has no fluid wave.  There is no guarding or rebound tenderness to palpation.  There is no palpable liver or spleen tip.  Musculoskeletal:        General: No tenderness or deformity. Normal range of motion.      Cervical back: Normal range of motion.  Lymphadenopathy:     Cervical: No cervical adenopathy.  Skin:    General: Skin is warm and dry.     Findings: No erythema or rash.     Comments: Skin exam does show a extensive but somewhat healing papular rash on her lower legs.  This is below the knees.  She has some dressing on the foot on the right foot.  She has no open wounds.  There is no blisters.  Neurological:     Mental Status: She is alert and oriented to person, place, and time.  Psychiatric:        Behavior: Behavior normal.        Thought Content: Thought  content normal.        Judgment: Judgment normal.       Lab Results  Component Value Date   WBC 5.7 12/18/2021   HGB 12.8 12/18/2021   HCT 38.8 12/18/2021   MCV 89.0 12/18/2021   PLT 178 12/18/2021   Lab Results  Component Value Date   FERRITIN 603 (H) 10/02/2020   IRON 31 (L) 10/02/2020   TIBC 241 10/02/2020   UIBC 211 10/02/2020   IRONPCTSAT 13 (L) 10/02/2020   Lab Results  Component Value Date   RBC 4.36 12/18/2021   No results found for: "KPAFRELGTCHN", "LAMBDASER", "KAPLAMBRATIO" No results found for: "IGGSERUM", "IGA", "IGMSERUM" No results found for: "TOTALPROTELP", "ALBUMINELP", "A1GS", "A2GS", "BETS", "BETA2SER", "GAMS", "MSPIKE", "SPEI"   Chemistry      Component Value Date/Time   NA 141 11/20/2021 0836   K 3.6 11/20/2021 0836   CL 107 11/20/2021 0836   CO2 27 11/20/2021 0836   BUN 8 11/20/2021 0836   CREATININE 0.61 11/20/2021 0836   CREATININE 0.58 (L) 08/20/2021 1541      Component Value Date/Time   CALCIUM 10.6 (H) 11/20/2021 0836   ALKPHOS 158 (H) 11/20/2021 0836   AST 21 11/20/2021 0836   ALT 30 11/20/2021 0836   BILITOT 0.6 11/20/2021 0836       Impression and Plan: Ms. Cerita Rabelo is a very pleasant 76 yo female from [place] with hepatocellular carcinoma and Hepatitis C.   We will go ahead with her treatment today.  She has done incredibly well with the nivolumab.  I  still do not believe that the nivolumab is a source of this rash.  I still believe this is from her Hepatitis C therapy.  I think she finishes this up in about a week or so.  Again she will be going out to [place].  I will have to have her on Nexavar while she is out there.  She will be out there for 3 months.  I would like to see her back in 1 month.  That will be the last treatment of nivolumab before she goes on to [place].  I will then see about getting her on the Nexavar until she comes back.  I am just happy that she is done incredibly well.  I still believe treating the Hepatitis C will overall help the hepatocellular carcinoma with therapy.    Volanda Napoleon, MD 11/3/20238:41 AM

## 2021-12-18 NOTE — Telephone Encounter (Signed)
Per 12/18/21 los - gave upcoming appointments - confirmed

## 2021-12-19 LAB — AFP TUMOR MARKER: AFP, Serum, Tumor Marker: 6.1 ng/mL (ref 0.0–9.2)

## 2021-12-28 ENCOUNTER — Other Ambulatory Visit: Payer: Self-pay

## 2021-12-28 ENCOUNTER — Encounter: Payer: Self-pay | Admitting: Hematology & Oncology

## 2021-12-28 ENCOUNTER — Other Ambulatory Visit: Payer: Self-pay | Admitting: Internal Medicine

## 2021-12-28 DIAGNOSIS — L280 Lichen simplex chronicus: Secondary | ICD-10-CM

## 2021-12-28 MED ORDER — PREDNISONE 5 MG PO TABS
5.0000 mg | ORAL_TABLET | Freq: Every day | ORAL | 0 refills | Status: DC
  Filled 2021-12-28: qty 30, 30d supply, fill #0

## 2022-01-04 ENCOUNTER — Other Ambulatory Visit: Payer: Self-pay | Admitting: Internal Medicine

## 2022-01-04 DIAGNOSIS — L280 Lichen simplex chronicus: Secondary | ICD-10-CM

## 2022-01-04 MED ORDER — PREDNISONE 20 MG PO TABS
20.0000 mg | ORAL_TABLET | Freq: Every day | ORAL | 0 refills | Status: DC
  Filled 2022-01-04: qty 30, 30d supply, fill #0

## 2022-01-05 ENCOUNTER — Encounter: Payer: Self-pay | Admitting: Hematology & Oncology

## 2022-01-05 ENCOUNTER — Other Ambulatory Visit: Payer: Self-pay

## 2022-01-06 ENCOUNTER — Other Ambulatory Visit: Payer: Self-pay

## 2022-01-06 ENCOUNTER — Encounter: Payer: Self-pay | Admitting: Hematology & Oncology

## 2022-01-06 MED ORDER — SILVER SULFADIAZINE 1 % EX CREA
TOPICAL_CREAM | CUTANEOUS | 1 refills | Status: DC
  Filled 2022-01-06: qty 400, 30d supply, fill #0

## 2022-01-14 ENCOUNTER — Encounter: Payer: Self-pay | Admitting: Hematology & Oncology

## 2022-01-14 ENCOUNTER — Inpatient Hospital Stay: Payer: Self-pay

## 2022-01-14 ENCOUNTER — Inpatient Hospital Stay (HOSPITAL_BASED_OUTPATIENT_CLINIC_OR_DEPARTMENT_OTHER): Payer: Self-pay | Admitting: Hematology & Oncology

## 2022-01-14 ENCOUNTER — Other Ambulatory Visit: Payer: Self-pay

## 2022-01-14 VITALS — BP 142/80 | HR 90 | Temp 97.8°F | Resp 18 | Ht 64.0 in | Wt 167.0 lb

## 2022-01-14 DIAGNOSIS — C220 Liver cell carcinoma: Secondary | ICD-10-CM

## 2022-01-14 LAB — CBC WITH DIFFERENTIAL (CANCER CENTER ONLY)
Abs Immature Granulocytes: 0.02 10*3/uL (ref 0.00–0.07)
Basophils Absolute: 0 10*3/uL (ref 0.0–0.1)
Basophils Relative: 0 %
Eosinophils Absolute: 0 10*3/uL (ref 0.0–0.5)
Eosinophils Relative: 0 %
HCT: 40.9 % (ref 36.0–46.0)
Hemoglobin: 13.3 g/dL (ref 12.0–15.0)
Immature Granulocytes: 0 %
Lymphocytes Relative: 39 %
Lymphs Abs: 2.6 10*3/uL (ref 0.7–4.0)
MCH: 28.7 pg (ref 26.0–34.0)
MCHC: 32.5 g/dL (ref 30.0–36.0)
MCV: 88.3 fL (ref 80.0–100.0)
Monocytes Absolute: 0.5 10*3/uL (ref 0.1–1.0)
Monocytes Relative: 7 %
Neutro Abs: 3.5 10*3/uL (ref 1.7–7.7)
Neutrophils Relative %: 54 %
Platelet Count: 206 10*3/uL (ref 150–400)
RBC: 4.63 MIL/uL (ref 3.87–5.11)
RDW: 14.6 % (ref 11.5–15.5)
WBC Count: 6.7 10*3/uL (ref 4.0–10.5)
nRBC: 0 % (ref 0.0–0.2)

## 2022-01-14 LAB — CMP (CANCER CENTER ONLY)
ALT: 25 U/L (ref 0–44)
AST: 20 U/L (ref 15–41)
Albumin: 4 g/dL (ref 3.5–5.0)
Alkaline Phosphatase: 141 U/L — ABNORMAL HIGH (ref 38–126)
Anion gap: 8 (ref 5–15)
BUN: 18 mg/dL (ref 8–23)
CO2: 26 mmol/L (ref 22–32)
Calcium: 10.4 mg/dL — ABNORMAL HIGH (ref 8.9–10.3)
Chloride: 105 mmol/L (ref 98–111)
Creatinine: 0.74 mg/dL (ref 0.44–1.00)
GFR, Estimated: >60 mL/min (ref >60)
Glucose, Bld: 177 mg/dL — ABNORMAL HIGH (ref 70–99)
Potassium: 3.7 mmol/L (ref 3.5–5.1)
Sodium: 139 mmol/L (ref 135–145)
Total Bilirubin: 0.5 mg/dL (ref 0.3–1.2)
Total Protein: 6.8 g/dL (ref 6.5–8.1)

## 2022-01-14 LAB — LACTATE DEHYDROGENASE: LDH: 158 U/L (ref 98–192)

## 2022-01-14 MED ORDER — SODIUM CHLORIDE 0.9 % IV SOLN
480.0000 mg | Freq: Once | INTRAVENOUS | Status: AC
  Administered 2022-01-14: 480 mg via INTRAVENOUS
  Filled 2022-01-14: qty 48

## 2022-01-14 MED ORDER — SODIUM CHLORIDE 0.9 % IV SOLN: Freq: Once | INTRAVENOUS | Status: AC

## 2022-01-14 MED ORDER — SODIUM CHLORIDE 0.9% FLUSH
10.0000 mL | INTRAVENOUS | Status: DC | PRN
  Administered 2022-01-14: 10 mL

## 2022-01-14 MED ORDER — HEPARIN SOD (PORK) LOCK FLUSH 100 UNIT/ML IV SOLN
500.0000 [IU] | Freq: Once | INTRAVENOUS | Status: AC | PRN
  Administered 2022-01-14: 500 [IU]

## 2022-01-14 NOTE — Patient Instructions (Signed)

## 2022-01-14 NOTE — Progress Notes (Signed)
Hematology and Oncology Follow Up Visit  [name] [name] [id number] 08/10/1945 76 y.o. 01/14/2022   Principle Diagnosis:  Hepatocellular carcinoma-multifocal Hepatitis C   Current Therapy:        Status post cycle 1 of nivolumab/ipilimumab -- d/c on 10/13/2020 due to hepatic toxicity Nivolumab 480 mg IV every 4 weeks - s/p cycle #19 -- started 11/21/2020 --changed to 480 mg on 08/2021   Interim History:  [name] [name] is here today for follow-up and treatment.  She did have a great time out in [place].  However, she now is developing these papular lesions on her body.  She stopped the hepatitis C therapy a couple weeks ago.  She did see dermatology.  Biopsies were taken last week.  We do not have the results yet..  She is back on prednisone at 20 mg a day.  This is helped.  She has put off going to [place].  She is post to go this month.  She probably will go in January.  Her last alpha-fetoprotein was 6.1.  As such, the hepatocellular carcinoma really is not much of a problem right now.  She has had no issues with fever.  There is been no bleeding.  Her blood sugars have been on the high side.  She has had no problems with diarrhea.  Overall, I would say performance status is probably ECOG 1.   Medications:  Allergies as of 01/14/2022       Reactions   Bactrim [sulfamethoxazole-trimethoprim] Swelling   Clarithromycin Swelling, Rash   Daughter is unsure if Clarithromycin or Amoxicillin caused rash and swelling. Patient states no allergy to amoxicillan - has taken this recently with no issues. (12/02/21)        Medication List        Accurate as of January 14, 2022  9:39 AM. If you have any questions, ask your nurse or doctor.          brimonidine 0.2 % ophthalmic solution Commonly known as: ALPHAGAN 1 drop 2 (two) times daily.   gabapentin 300 MG capsule Commonly known as: NEURONTIN Take 1 capsule (300 mg total) by mouth once nightly at  bedtime.   HumaLOG 100 UNIT/ML injection Generic drug: insulin lispro SSI with meals: 200-250 3 units, 251-300 6 units, 301-350 9 units, 351-400 12 units, 401-450 15 units, > 451 18 units under the skin as directed   HumuLIN 70/30 KwikPen (70-30) 100 UNIT/ML KwikPen Generic drug: insulin isophane & regular human KwikPen INJECT 30-40 units subcutANEOUSLY EVERY MORNING and 5-12 units EVERY EVENING   HumuLIN 70/30 (70-30) 100 UNIT/ML injection Generic drug: insulin NPH-regular Human Inject 30-40 units into the skin once every morning and 5-12 units once every evening.   linaclotide 145 MCG Caps capsule Commonly known as: Linzess Take 1 capsule (145 mcg total) by mouth once daily before breakfast. (Office visit for further refills)   losartan 50 MG tablet Commonly known as: COZAAR Take 1 tablet (50 mg total) by mouth at bedtime.   NIFEdipine 30 MG 24 hr tablet Commonly known as: PROCARDIA-XL/NIFEDICAL-XL Take 1 tablet (30 mg total) by mouth in the morning and at bedtime.   pantoprazole 40 MG tablet Commonly known as: Protonix Take 1 tablet (40 mg total) by mouth 2 (two) times daily. What changed: when to take this   predniSONE 20 MG tablet Commonly known as: DELTASONE Take 1 tablet (20 mg total) by mouth daily with breakfast.   Sofosbuvir-Velpatasvir 400-100 MG Tabs Commonly known as: Epclusa Take  1 tablet by mouth daily.   SSD 1 % cream Generic drug: silver sulfADIAZINE Apply to the open, ulcerated areas of the skin once daily as needed.   SSD 1 % cream Generic drug: silver sulfADIAZINE Apply to the open, ulcerated areas of the skin once daily as needed.   triamcinolone ointment 0.1 % Commonly known as: KENALOG Apply 1 Application topically 2 (two) times daily.   True Metrix Blood Glucose Test test strip Generic drug: glucose blood Use as instructed   TRUEplus 5-Bevel Pen Needles 31G X 6 MM Misc Generic drug: Insulin Pen Needle use as directed         Allergies:  Allergies  Allergen Reactions   Bactrim [Sulfamethoxazole-Trimethoprim] Swelling   Clarithromycin Swelling and Rash    Daughter is unsure if Clarithromycin or Amoxicillin caused rash and swelling.  Patient states no allergy to amoxicillan - has taken this recently with no issues. (12/02/21)    Past Medical History, Surgical history, Social history, and Family History were reviewed and updated.  Review of Systems: Review of Systems  Constitutional: Negative.   HENT: Negative.    Eyes: Negative.   Respiratory: Negative.    Cardiovascular: Negative.   Gastrointestinal: Negative.   Genitourinary: Negative.   Musculoskeletal: Negative.   Skin: Negative.   Neurological:  Positive for tingling.  Endo/Heme/Allergies: Negative.   Psychiatric/Behavioral: Negative.       Physical Exam:  height is '5\' 4"'$  (1.626 m) and weight is 167 lb (75.8 kg). Her oral temperature is 97.8 F (36.6 C). Her blood pressure is 142/80 (abnormal) and her pulse is 90. Her respiration is 18 and oxygen saturation is 100%.   Wt Readings from Last 3 Encounters:  01/14/22 167 lb (75.8 kg)  12/18/21 162 lb (73.5 kg)  12/03/21 162 lb 3.2 oz (73.6 kg)    Physical Exam Vitals reviewed.  HENT:     Head: Normocephalic and atraumatic.  Eyes:     Pupils: Pupils are equal, round, and reactive to light.  Cardiovascular:     Rate and Rhythm: Normal rate and regular rhythm.     Heart sounds: Normal heart sounds.  Pulmonary:     Effort: Pulmonary effort is normal.     Breath sounds: Normal breath sounds.  Abdominal:     General: Bowel sounds are normal.     Palpations: Abdomen is soft.     Comments: Abdominal exam shows a well-healed laparotomy scar.  She has no fluid wave.  There is no guarding or rebound tenderness to palpation.  There is no palpable liver or spleen tip.  Musculoskeletal:        General: No tenderness or deformity. Normal range of motion.     Cervical back: Normal range of  motion.  Lymphadenopathy:     Cervical: No cervical adenopathy.  Skin:    General: Skin is warm and dry.     Findings: No erythema or rash.     Comments: Skin exam does show a extensive but somewhat healing papular rash on her lower legs.  This is below the knees.  She has some dressing on the foot on the right foot.  She has no open wounds.  There is no blisters.  Neurological:     Mental Status: She is alert and oriented to person, place, and time.  Psychiatric:        Behavior: Behavior normal.        Thought Content: Thought content normal.  Judgment: Judgment normal.       Lab Results  Component Value Date   WBC 6.7 01/14/2022   HGB 13.3 01/14/2022   HCT 40.9 01/14/2022   MCV 88.3 01/14/2022   PLT 206 01/14/2022   Lab Results  Component Value Date   FERRITIN 603 (H) 10/02/2020   IRON 31 (L) 10/02/2020   TIBC 241 10/02/2020   UIBC 211 10/02/2020   IRONPCTSAT 13 (L) 10/02/2020   Lab Results  Component Value Date   RBC 4.63 01/14/2022   No results found for: "KPAFRELGTCHN", "LAMBDASER", "KAPLAMBRATIO" No results found for: "IGGSERUM", "IGA", "IGMSERUM" No results found for: "TOTALPROTELP", "ALBUMINELP", "A1GS", "A2GS", "BETS", "BETA2SER", "GAMS", "MSPIKE", "SPEI"   Chemistry      Component Value Date/Time   NA 139 01/14/2022 0804   K 3.7 01/14/2022 0804   CL 105 01/14/2022 0804   CO2 26 01/14/2022 0804   BUN 18 01/14/2022 0804   CREATININE 0.74 01/14/2022 0804   CREATININE 0.58 (L) 08/20/2021 1541      Component Value Date/Time   CALCIUM 10.4 (H) 01/14/2022 0804   ALKPHOS 141 (H) 01/14/2022 0804   AST 20 01/14/2022 0804   ALT 25 01/14/2022 0804   BILITOT 0.5 01/14/2022 0804       Impression and Plan: [name] [name] is a very pleasant 76 yo female from [place] with hepatocellular carcinoma and Hepatitis C.   We will go ahead with her treatment today.  She has done incredibly well with the nivolumab.  Will be incredibly interesting to  see what the biopsy has to show.  Again would be highly surprised if this is from the nivolumab.  My plan was to have her on Nexavar when she goes over to [place].  She was post to go over for 3 months.  I thought that the Nexavar be a good way to try to treat her while she was over in [place].  Again, she might go in January.  I think we should move ahead with the Nexavar today.  We will plan to see her back in a month.  I think that she we will probably go over to [place] in early January.   Volanda Napoleon, MD 11/30/20239:39 AM

## 2022-01-15 ENCOUNTER — Other Ambulatory Visit: Payer: Self-pay | Admitting: Internal Medicine

## 2022-01-15 ENCOUNTER — Other Ambulatory Visit: Payer: Self-pay

## 2022-01-15 ENCOUNTER — Ambulatory Visit: Payer: Self-pay | Attending: Internal Medicine | Admitting: Internal Medicine

## 2022-01-15 ENCOUNTER — Encounter: Payer: Self-pay | Admitting: Hematology & Oncology

## 2022-01-15 VITALS — BP 138/76 | HR 80 | Temp 98.0°F | Ht 64.0 in | Wt 165.0 lb

## 2022-01-15 DIAGNOSIS — L280 Lichen simplex chronicus: Secondary | ICD-10-CM

## 2022-01-15 DIAGNOSIS — Z23 Encounter for immunization: Secondary | ICD-10-CM

## 2022-01-15 DIAGNOSIS — I152 Hypertension secondary to endocrine disorders: Secondary | ICD-10-CM

## 2022-01-15 DIAGNOSIS — E1159 Type 2 diabetes mellitus with other circulatory complications: Secondary | ICD-10-CM

## 2022-01-15 DIAGNOSIS — E1142 Type 2 diabetes mellitus with diabetic polyneuropathy: Secondary | ICD-10-CM

## 2022-01-15 MED ORDER — LOSARTAN POTASSIUM 100 MG PO TABS
100.0000 mg | ORAL_TABLET | Freq: Every day | ORAL | 6 refills | Status: DC
  Filled 2022-01-15: qty 30, 30d supply, fill #0
  Filled 2022-02-11: qty 120, 120d supply, fill #1

## 2022-01-15 MED ORDER — INSULIN LISPRO 100 UNIT/ML IJ SOLN
INTRAMUSCULAR | 2 refills | Status: DC
  Filled 2022-01-15: qty 10, 19d supply, fill #0

## 2022-01-15 NOTE — Patient Instructions (Signed)
I would recommend taking vitamin D 400 IUs daily.  This can be purchased over-the-counter. Your blood pressure is not at goal.  I recommend increasing the Cozaar to 100 mg daily.  I have sent an updated prescription to our pharmacy.  You should get your COVID booster.  This can be given at any outside pharmacy or the health department.

## 2022-01-15 NOTE — Progress Notes (Signed)
Patient ID: [name] [name], female    DOB: November 18, 1945  MRN: [id number]  CC: Diabetes (Diabetes f/u & foot dermatitis. /Foot rashes cause pain in shower, numbness in toes./Already received fly vax.)   Subjective: [name] [name] is a 76 y.o. female who presents for chronic ds management.  Daughter is with her.  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    Lichenoid dermatitis: Flare started occurring when Prednisone decreased to 2.5 mg.  She was in [place], self increased to 5 mg.  Lesions increased in quantity over extremities and even on nipple and painful.  Prednisone increased to 20 mg last wk after daughter sent me a Mychart message. Saw derm NP in [place] last wk for f/u. Repeat bx confirms lichen planus.  Advised to stay on the 20 mg Prednisone for now.  The lesions are drying up again with the increased dose of the prednisone and the pain is decreasing. Missed PC from derm yesterday to discuss other options.  Daughter plans to call them back today. Epclusa finished 2.5 wks ago Still on nivolumab through her oncologist.  Had plan to go [place] the [place] but this has been postponed due to flareup of her skin condition Had plan to go to [facility] in Jan-end April 2024.  Her oncologist plans to give her a pill that she could take during the time that she is in [place].  DM: Checking blood sugars 3 times a day before meals.  Before breakfast range is less than 120, before dinner 120-130 and before lunch she did this in the 160s.  She is taking Humulin 70/30 30 units in the morning and 5 to 12 units in the evening.  She takes the Humalog 2 to 5 units with breakfast depending on what her blood sugar is.  HTN: She is on Cozaar 50 mg daily and nifedipine  XL 30 mg twice a day.  Reports that her blood pressure readings have been increasing with systolics in the 235T to 614E but normal diastolic readings.  Daughter states that patient does not limit salt in the foods as much as she should.  Patient also tells me that pantoprazole is back to 40 mg twice a day.  It was decreased to once a day during the time that she was on Epclusa due to potential interaction.  She is not on calcium or vitamin D supplement.    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   Hypercalcemia 09/22/2020   Chronic hepatitis C with hepatic coma ([place]) 09/22/2020   Goals of care, counseling/discussion 09/01/2020   Hepatocellular carcinoma ([place]) 08/14/2020   Hyperglycemia 08/10/2020   Pneumoperitoneum 08/09/2020   History of colorectal cancer 08/09/2020   Liver mass, left lobe 08/09/2020   Insulin-requiring or dependent type II diabetes mellitus ([place]) 08/09/2020   Hypertension associated with diabetes ([place]) 08/09/2020   Constipation, chronic 08/09/2020   Perforated gastric ulcer s/p lap omental Graham patch 08/09/2020 08/09/2020     Current Outpatient Medications on File Prior to Visit  Medication Sig Dispense Refill   brimonidine (ALPHAGAN) 0.2 % ophthalmic solution 1 drop 2 (two) times daily.     gabapentin (NEURONTIN) 300 MG capsule Take 1 capsule (300 mg total) by mouth once nightly at bedtime. [place]  capsule 3   glucose blood (TRUE METRIX BLOOD GLUCOSE TEST) test strip Use as instructed 100 each 12   insulin isophane & regular human KwikPen (HUMULIN 70/30 KWIKPEN) (70-30) 100 UNIT/ML KwikPen INJECT 30-40 units subcutANEOUSLY EVERY MORNING and 5-12 units EVERY EVENING 15 mL 11   insulin lispro (HUMALOG) 100 UNIT/ML injection SSI with meals: 200-250 3 units, 251-300 6 units, 301-350 9 units, 351-400 12 units, 401-450 15 units, > 451 18 units under the skin as directed 10 mL 2   insulin NPH-regular Human (70-30) 100 UNIT/ML  injection Inject 30-40 units into the skin once every morning and 5-12 units once every evening. 10 mL 0   Insulin Pen Needle 31G X 6 MM MISC use as directed 100 each 6   losartan (COZAAR) 50 MG tablet Take 1 tablet (50 mg total) by mouth at bedtime. 30 tablet 6   NIFEdipine (PROCARDIA-XL/NIFEDICAL-XL) 30 MG 24 hr tablet Take 1 tablet (30 mg total) by mouth in the morning and at bedtime. 180 tablet 0   pantoprazole (PROTONIX) 40 MG tablet Take 1 tablet (40 mg total) by mouth 2 (two) times daily. (Patient taking differently: Take 40 mg by mouth daily.) 180 tablet 3   predniSONE (DELTASONE) 20 MG tablet Take 1 tablet (20 mg total) by mouth daily with breakfast. 30 tablet 0   silver sulfADIAZINE (SILVADENE) 1 % cream Apply to the open, ulcerated areas of the skin once daily as needed. 400 g 1   silver sulfADIAZINE (SILVADENE) 1 % cream Apply to the open, ulcerated areas of the skin once daily as needed. 400 g 1   triamcinolone ointment (KENALOG) 0.1 % Apply 1 Application topically 2 (two) times daily. 80 g 1   linaclotide (LINZESS) 145 MCG CAPS capsule Take 1 capsule (145 mcg total) by mouth once daily before breakfast. (Office visit for further refills) (Patient not taking: Reported on 12/02/2021) 30 capsule 2   Sofosbuvir-Velpatasvir (EPCLUSA) 400-100 MG TABS Take 1 tablet by mouth daily. (Patient not taking: Reported on 01/15/2022) 30 tablet 2   Current Facility-Administered Medications on File Prior to Visit  Medication Dose Route Frequency Provider Last Rate Last Admin   sodium chloride flush (NS) 0.9 % injection 10 mL  10 mL Intravenous PRN Celso Amy, NP   10 mL at 12/18/20 1009    Allergies  Allergen Reactions   Bactrim [Sulfamethoxazole-Trimethoprim] Swelling   Clarithromycin Swelling and Rash    Daughter is unsure if Clarithromycin or Amoxicillin caused rash and swelling.  Patient states no allergy to amoxicillan - has taken this recently with no issues. (12/02/21)    Social  History   Socioeconomic History   Marital status: Widowed    Spouse name: Not on file   Number of children: 5   Years of education: 14   Highest education level: Associate degree: occupational, Hotel manager, or vocational program  Occupational History   Not on file  Tobacco Use   Smoking status: Never   Smokeless tobacco: Never  Vaping Use   Vaping Use: Never used  Substance and Sexual Activity   Alcohol use: Not Currently   Drug use: Never   Sexual activity: Not Currently  Other Topics Concern   Not on file  Social History Narrative   Originally from [place].  Speaks [place] and [place]   Social Determinants of Radio broadcast assistant Strain: Not on file  Food Insecurity: Not on file  Transportation Needs: Not on file  Physical Activity: Not on file  Stress: Not on file  Social Connections: Not on file  Intimate Partner Violence: Not on file    Family History  Problem Relation Age of Onset   Cancer Father        type unknown, was on the leg   Other Daughter        Acoustic neuroma   Breast cancer Neg Hx    Colon cancer Neg Hx    Esophageal cancer Neg Hx    Rectal cancer Neg Hx    Stomach cancer Neg Hx     Past Surgical History:  Procedure Laterality Date   COLON RESECTION  2005   In [place] - ?colon cancer   IR IMAGING GUIDED PORT INSERTION  09/18/2020   LAPAROSCOPIC GASTRIC RESECTION N/A 08/09/2020   Procedure: LAPAROSCOPIC EXPLORATION OF ABDOMEN, PERFORATED ULCER REPAIR, LIVER BIOPSY;  Surgeon: Michael Boston, MD;  Location: [facility];  Service: General;  Laterality: N/A;    ROS: Review of Systems Negative except as stated above  PHYSICAL EXAM: BP 138/76 (BP Location: Left Arm, Patient Position: Sitting, Cuff Size: Normal)   Pulse 80   Temp 98 F (36.7 C) (Oral)   Ht '5\' 4"'$  (1.626 m)   Wt 165 lb (74.8 kg)   SpO2 98%   BMI 28.32 kg/m   Physical Exam BP 160/70 General appearance - alert, well appearing, and in no distress Mental status - normal  mood, behavior, speech, dress, motor activity, and thought processes Chest - clear to auscultation, no wheezes, rales or rhonchi, symmetric air entry Heart - normal rate, regular rhythm, normal S1, S2, no murmurs, rubs, clicks or gallops Extremities -no lower extremity edema. Skin -she has multiple scattered plaque-like lesions over both upper and lower extremities but worse on the lower extremities.      Latest Ref Rng & Units 01/14/2022    8:04 AM 12/18/2021    7:50 AM 11/20/2021    8:36 AM  CMP  Glucose 70 - 99 mg/dL 177  146  135   BUN 8 - 23 mg/dL '18  18  8   '$ Creatinine 0.44 - 1.00 mg/dL 0.74  0.63  0.61   Sodium 135 - 145 mmol/L 139  139  141   Potassium 3.5 - 5.1 mmol/L 3.7  3.7  3.6   Chloride 98 - 111 mmol/L 105  107  107   CO2 22 - 32 mmol/L '26  27  27   '$ Calcium 8.9 - 10.3 mg/dL 10.4  10.2  10.6   Total Protein 6.5 - 8.1 g/dL 6.8  7.2  7.3   Total Bilirubin 0.3 - 1.2 mg/dL 0.5  0.6  0.6   Alkaline Phos 38 - 126 U/L 141  138  158   AST 15 - 41 U/L '20  21  21   '$ ALT 0 - 44 U/L '25  29  30    '$ Lipid Panel  No results found for: "CHOL", "TRIG", "HDL", "CHOLHDL", "VLDL", "LDLCALC", "LDLDIRECT"  CBC    Component Value Date/Time   WBC 6.7 01/14/2022 0804   WBC 4.5 08/24/2021 0858   RBC 4.63 01/14/2022 0804   HGB 13.3 01/14/2022 0804   HCT 40.9 01/14/2022 0804   PLT 206 01/14/2022 0804   MCV 88.3 01/14/2022 0804   MCH 28.7 01/14/2022 0804   MCHC 32.5 01/14/2022 0804   RDW 14.6 01/14/2022 0804   LYMPHSABS 2.6 01/14/2022 0804   MONOABS 0.5 01/14/2022 0804   EOSABS 0.0 01/14/2022 0804   BASOSABS 0.0 01/14/2022 0804  ASSESSMENT AND PLAN:  1. Lichenoid dermatitis Continue prednisone 20 mg daily for now since lesions have been drying up.  Daughter will call the dermatology NP back today to see what other options are available for her at this time.  We were hoping that this would have gotten better with her now being off Epclusa.  Advised daughter and patient that lichen  planus is a dermatitis that can be associated with hepatitis C. -Ideally she should be on low dose Fosamax to help prevent osteoporosis given that she is on prednisone long-term.  However I have held off on doing that given her history of gastric ulcer.  Advised that she takes vitamin D supplement daily 400 to 800 mcg.  2. Type 2 diabetes mellitus with peripheral neuropathy (HCC) Blood sugars not bad considering that she is on prednisone.  She will continue current dose of Humulin 70/30 and Humalog as prescribed. Should call to get 11-monthsupply of medications [place] 3. Hypertension associated with diabetes ([place] Not at goal.  Increase Cozaar to 100 mg daily.  After being on this increased dose for 1 week, she should return to the lab for repeat chemistry. - Basic Metabolic Panel; Future - losartan (COZAAR) 100 MG tablet; Take 1 tablet (100 mg total) by mouth at bedtime.  Dispense: 30 tablet; Refill: 6  4. Need for second booster dose of COVID-19 vaccine She will get this at any outside pharmacy or the health department.       Patient was given the opportunity to ask questions.  Patient verbalized understanding of the plan and was able to repeat key elements of the plan.   This documentation was completed using DRadio producer  Any transcriptional errors are unintentional.  No orders of the defined types were placed in this encounter.    Requested Prescriptions    No prescriptions requested or ordered in this encounter    No follow-ups on file.  DKarle Plumber MD, FACP

## 2022-01-16 LAB — AFP TUMOR MARKER: AFP, Serum, Tumor Marker: 5.8 ng/mL (ref 0.0–9.2)

## 2022-01-18 ENCOUNTER — Encounter: Payer: Self-pay | Admitting: Hematology & Oncology

## 2022-01-18 ENCOUNTER — Encounter: Payer: Self-pay | Admitting: Internal Medicine

## 2022-01-18 ENCOUNTER — Encounter: Payer: Self-pay | Admitting: *Deleted

## 2022-01-18 ENCOUNTER — Ambulatory Visit (INDEPENDENT_AMBULATORY_CARE_PROVIDER_SITE_OTHER): Payer: Self-pay | Admitting: Internal Medicine

## 2022-01-18 ENCOUNTER — Other Ambulatory Visit: Payer: Self-pay

## 2022-01-18 ENCOUNTER — Other Ambulatory Visit: Payer: Self-pay | Admitting: Pharmacist

## 2022-01-18 VITALS — BP 124/76 | HR 80 | Ht 64.0 in | Wt 164.0 lb

## 2022-01-18 DIAGNOSIS — Z794 Long term (current) use of insulin: Secondary | ICD-10-CM

## 2022-01-18 DIAGNOSIS — E1165 Type 2 diabetes mellitus with hyperglycemia: Secondary | ICD-10-CM | POA: Insufficient documentation

## 2022-01-18 LAB — POCT GLUCOSE (DEVICE FOR HOME USE): POC Glucose: 299 mg/dl — AB (ref 70–99)

## 2022-01-18 LAB — POCT GLYCOSYLATED HEMOGLOBIN (HGB A1C): Hemoglobin A1C: 6.7 % — AB (ref 4.0–5.6)

## 2022-01-18 MED ORDER — HUMALOG KWIKPEN 200 UNIT/ML ~~LOC~~ SOPN: PEN_INJECTOR | SUBCUTANEOUS | 3 refills | Status: DC

## 2022-01-18 MED ORDER — HUMALOG KWIKPEN 200 UNIT/ML ~~LOC~~ SOPN
PEN_INJECTOR | SUBCUTANEOUS | 0 refills | Status: DC
  Filled 2022-01-18: qty 15, 15d supply, fill #0
  Filled 2022-01-18: qty 12, 24d supply, fill #0
  Filled 2022-02-11: qty 12, 24d supply, fill #1

## 2022-01-18 MED ORDER — HUMALOG KWIKPEN 200 UNIT/ML ~~LOC~~ SOPN
PEN_INJECTOR | SUBCUTANEOUS | 3 refills | Status: DC
  Filled 2022-01-18: qty 90, fill #0

## 2022-01-18 MED ORDER — LANTUS SOLOSTAR 100 UNIT/ML ~~LOC~~ SOPN
12.0000 [IU] | PEN_INJECTOR | Freq: Every day | SUBCUTANEOUS | 6 refills | Status: DC
  Filled 2022-01-18: qty 3, 25d supply, fill #0
  Filled 2022-01-18: qty 15, 125d supply, fill #0
  Filled 2022-02-11: qty 15, 125d supply, fill #1
  Filled 2022-07-13: qty 3, 25d supply, fill #2
  Filled 2022-08-03: qty 3, 25d supply, fill #3
  Filled 2022-09-03: qty 3, 25d supply, fill #4
  Filled 2022-10-03: qty 3, 25d supply, fill #5
  Filled 2022-11-02: qty 3, 25d supply, fill #6
  Filled 2022-11-23 (×2): qty 3, 25d supply, fill #7
  Filled 2022-12-23 (×2): qty 3, 25d supply, fill #8
  Filled 2023-01-11 (×2): qty 3, 25d supply, fill #9

## 2022-01-18 MED ORDER — INSULIN PEN NEEDLE 32G X 4 MM MISC
1.0000 | Freq: Every day | 3 refills | Status: DC
  Filled 2022-01-18: qty 100, 25d supply, fill #0
  Filled 2022-02-11: qty 300, 75d supply, fill #1
  Filled 2022-06-18: qty 300, 75d supply, fill #2
  Filled 2022-10-12: qty 300, 75d supply, fill #3
  Filled 2023-01-11 (×2): qty 300, 75d supply, fill #4

## 2022-01-18 NOTE — Progress Notes (Signed)
Name: [name] [name]  MRN/ DOB: [id number], Apr 21, 1945   Age/ Sex: 76 y.o., female    PCP: Ladell Pier, MD   Reason for Endocrinology Evaluation: Type 2 Diabetes Mellitus     Date of Initial Endocrinology Visit: 01/18/2022     PATIENT IDENTIFIER: [name] [name] is a 76 y.o. female with a past medical history of Hx of perforated gastric ulcer, chronic hepatitis C, HTN, hepatocellular carcinoma, T2DM. The patient presented for initial endocrinology clinic visit on 01/18/2022 for consultative assistance with her diabetes management.    HPI: [name] [name] is here with her daughter    Diagnosed with DM 2005 Prior Medications tried/Intolerance: was on oral agents years ago , bit does not recall  Currently checking blood sugars 3 x / day.  Hypoglycemia episodes : yes               Symptoms: yes                 Frequency: 2/month  Hemoglobin A1c has ranged from 7.3% in 06/2021, peaking at 7.5% in 2023.   In terms of diet, the patient eats 3 meals a day, snacks between    Of note, the pt follows with oncology ( Dr. Marin Olp) for hepatocellular carcinoma, currently on Nivolumab infusions   She is on Prednisone  through dermatology for lichenoid dermatitis     HOME DIABETES REGIMEN: Humulin 70/30 30 units  in the morning and 5-20 units at night  Humalog per SS  >200  = 3 units     Statin: No ACE-I/ARB: Yes    METER DOWNLOAD SUMMARY: Did not bring      DIABETIC COMPLICATIONS: Microvascular complications:   Denies: CKD Last eye exam: Completed   Macrovascular complications:   Denies: CAD, PVD, CVA   PAST HISTORY: Past Medical History:  Past Medical History:  Diagnosis Date   Colon cancer ([place])    Diabetes mellitus without complication ([place])    Glaucoma    Goals of care, counseling/discussion 09/01/2020   Hepatitis C    Hepatocellular carcinoma ([place])    Hypertension    Past Surgical History:  Past Surgical History:   Procedure Laterality Date   COLON RESECTION  2005   In [place] - ?colon cancer   IR IMAGING GUIDED PORT INSERTION  09/18/2020   LAPAROSCOPIC GASTRIC RESECTION N/A 08/09/2020   Procedure: LAPAROSCOPIC EXPLORATION OF ABDOMEN, PERFORATED ULCER REPAIR, LIVER BIOPSY;  Surgeon: Michael Boston, MD;  Location: [facility];  Service: General;  Laterality: N/A;    Social History:  reports that she has never smoked. She has never used smokeless tobacco. She reports that she does not currently use alcohol. She reports that she does not use drugs. Family History:  Family History  Problem Relation Age of Onset   Cancer Father        type unknown, was on the leg   Other Daughter        Acoustic neuroma   Breast cancer Neg Hx    Colon cancer Neg Hx    Esophageal cancer Neg Hx    Rectal cancer Neg Hx    Stomach cancer Neg Hx      HOME MEDICATIONS: Allergies as of 01/18/2022       Reactions   Bactrim [sulfamethoxazole-trimethoprim] Swelling   Clarithromycin Swelling, Rash   Daughter is unsure if Clarithromycin or Amoxicillin caused rash and swelling. Patient states no allergy to amoxicillan - has taken this recently with no issues. (12/02/21)  Medication List        Accurate as of January 18, 2022 11:05 AM. If you have any questions, ask your nurse or doctor.          STOP taking these medications    linaclotide 145 MCG Caps capsule Commonly known as: Linzess Stopped by: Dorita Sciara, MD       TAKE these medications    brimonidine 0.2 % ophthalmic solution Commonly known as: ALPHAGAN 1 drop 2 (two) times daily.   gabapentin 300 MG capsule Commonly known as: NEURONTIN Take 1 capsule (300 mg total) by mouth once nightly at bedtime.   HumuLIN 70/30 KwikPen (70-30) 100 UNIT/ML KwikPen Generic drug: insulin isophane & regular human KwikPen INJECT 30-40 units subcutANEOUSLY EVERY MORNING and 5-12 units EVERY EVENING   HumuLIN 70/30 (70-30) 100 UNIT/ML  injection Generic drug: insulin NPH-regular Human Inject 30-40 units into the skin once every morning and 5-12 units once every evening.   insulin lispro 100 UNIT/ML injection Commonly known as: HumaLOG inject per sliding scale with meals: 200-250 3 units, 251-300 6 units, 301-350 9 units, 351-400 12 units, 401-450 15 units, > 451 18 units under the skin as directed   losartan 100 MG tablet Commonly known as: COZAAR Take 1 tablet (100 mg total) by mouth at bedtime.   Magnesium 400 MG Tabs Take 1 tablet by mouth daily at 6 (six) AM.   NIFEdipine 30 MG 24 hr tablet Commonly known as: PROCARDIA-XL/NIFEDICAL-XL Take 1 tablet (30 mg total) by mouth in the morning and at bedtime.   pantoprazole 40 MG tablet Commonly known as: Protonix Take 1 tablet (40 mg total) by mouth 2 (two) times daily. What changed: when to take this   predniSONE 20 MG tablet Commonly known as: DELTASONE Take 1 tablet (20 mg total) by mouth daily with breakfast.   Sofosbuvir-Velpatasvir 400-100 MG Tabs Commonly known as: Epclusa Take 1 tablet by mouth daily.   SSD 1 % cream Generic drug: silver sulfADIAZINE Apply to the open, ulcerated areas of the skin once daily as needed.   SSD 1 % cream Generic drug: silver sulfADIAZINE Apply to the open, ulcerated areas of the skin once daily as needed.   triamcinolone ointment 0.1 % Commonly known as: KENALOG Apply 1 Application topically 2 (two) times daily.   True Metrix Blood Glucose Test test strip Generic drug: glucose blood Use as instructed   TRUEplus 5-Bevel Pen Needles 31G X 6 MM Misc Generic drug: Insulin Pen Needle use as directed   Vitamin D (Cholecalciferol) 10 MCG (400 UNIT) Tabs Take 1 tablet by mouth daily at 6 (six) AM.         ALLERGIES: Allergies  Allergen Reactions   Bactrim [Sulfamethoxazole-Trimethoprim] Swelling   Clarithromycin Swelling and Rash    Daughter is unsure if Clarithromycin or Amoxicillin caused rash and  swelling.  Patient states no allergy to amoxicillan - has taken this recently with no issues. (12/02/21)     REVIEW OF SYSTEMS: A comprehensive ROS was conducted with the patient and is negative except as per HPI and below:  Review of Systems  Gastrointestinal:  Positive for constipation. Negative for abdominal pain and nausea.  All other systems reviewed and are negative.     OBJECTIVE:   VITAL SIGNS: BP 124/76 (BP Location: Left Arm, Patient Position: Sitting, Cuff Size: Small)   Pulse 80   Ht '5\' 4"'$  (1.626 m)   Wt 164 lb (74.4 kg)   SpO2 94%   BMI  28.15 kg/m    PHYSICAL EXAM:  General: Pt appears well and is in NAD  Neck: General: Supple without adenopathy or carotid bruits. Thyroid: Thyroid size normal.  No goiter or nodules appreciated.   Lungs: Clear with good BS bilat with no rales, rhonchi, or wheezes  Heart: RRR   Abdomen:  soft, nontender  Extremities:  Lower extremities - No pretibial edema, patient with scattered hyperpigmented macular rash  Neuro: MS is good with appropriate affect, pt is alert and Ox3      DATA REVIEWED:  Lab Results  Component Value Date   HGBA1C 6.7 (A) 01/18/2022   HGBA1C 7.5 (A) 10/30/2021   HGBA1C 7.3 (A) 06/19/2021     Latest Reference Range & Units 01/14/22 08:04  Sodium 135 - 145 mmol/L 139  Potassium 3.5 - 5.1 mmol/L 3.7  Chloride 98 - 111 mmol/L 105  CO2 22 - 32 mmol/L 26  Glucose 70 - 99 mg/dL 177 (H)  BUN 8 - 23 mg/dL 18  Creatinine 0.44 - 1.00 mg/dL 0.74  Calcium 8.9 - 10.3 mg/dL 10.4 (H)  Anion gap 5 - 15  8  Alkaline Phosphatase 38 - 126 U/L 141 (H)  Albumin 3.5 - 5.0 g/dL 4.0  AST 15 - 41 U/L 20  ALT 0 - 44 U/L 25  Total Protein 6.5 - 8.1 g/dL 6.8  Total Bilirubin 0.3 - 1.2 mg/dL 0.5  GFR, Est Non African American >60 mL/min >60  LDH 98 - 192 U/L 158    ASSESSMENT / PLAN / RECOMMENDATIONS:   1) Type 2 Diabetes Mellitus, Optimally controlled, Without complications - Most recent A1c of 6.7 %. Goal A1c <  7.0 %.    -Her A1c is optimal but she has been using Novolin mix as well as Humalog, the patient tends to self adjust her insulin sometimes she will take more during the day and none at night  -For example last night her BG was 180 mg/DL, she did not take any insulin, her fasting BG this morning was 110 mg/DL, should take 30 units of Novolin mix in addition to 10 units of Humalog before breakfast, in office BG was 299 mg/DL -Patient has history of ruptured gastric ulcer as well as hepatocellular carcinoma I would not be a candidate for any medications that we will cause GI side effects such as GLP-1 agonist - at this at this time the safest option given comorbidities is insulin -Daughter is concerned that mother self adjust her insulin, patient has nursing background, at I did discourage the patient from self adjusting insulin doses as this will create glycemic excursions with hypoglycemia and severe hyperglycemia -Of note, the patient is on chronic prednisone therapy for lichenoid dermatitis -I will stop Novolin 70/30, I will start her on basal/prandial insulin -She was advised to take Humalog before each meal -She was also advised to use correction scale as needed before meals -She was provided with a glucose log and to write BG's before each meal and to bring them on her next visit  MEDICATIONS: -Stop Humulin mix -Start Lantus 12 units daily -Start Humalog 20 units with each meal 3 times daily -Start correction factor: Humalog (BG -130/25)  EDUCATION / INSTRUCTIONS: BG monitoring instructions: Patient is instructed to check her blood sugars 3 times a day, before each meal . Call [facility] if: BG persistently < 70  I reviewed the Rule of 15 for the treatment of hypoglycemia in detail with the patient. Literature supplied.   2)  Diabetic complications:  Eye: Does not have known diabetic retinopathy.  Neuro/ Feet: Does not have known diabetic peripheral  neuropathy. Renal: Patient does not have known baseline CKD. She is  on an ACEI/ARB at present.    Follow-up in 1 week  Signed electronically by: Mack Guise, MD  [facility]  [facility] [address] [place], [place] [address] Phone: [phone] FAX: [phone]   CC: Ladell Pier, MD [place] [address] Phone: [phone]  Fax: [phone]    Return to Endocrinology clinic as below: Future Appointments  Date Time Provider [place]  01/20/2022 11:10 AM Thornton Park, MD [facility]  01/22/2022  8:45 AM [facility] LAB [facility] None  02/11/2022  7:45 AM [facility] LAB [facility] None  02/11/2022  8:00 AM [facility] INJ NURSE [facility] None  02/11/2022  8:15 AM Marin Olp, Rudell Cobb, MD [facility] None  02/11/2022  8:30 AM [facility] A1 [facility] None  03/18/2022  2:30 PM Ladell Pier, MD [facility] None  06/02/2022  8:45 AM [facility] LAB [facility]  06/23/2022  3:30 PM Vu, Rockey Situ, MD [facility]

## 2022-01-18 NOTE — Patient Instructions (Addendum)
STOP Humulin Mix  Start Lantus 12 units ONCE daily  Start Humalog 20 units with each meal Humalog correctional insulin: ADD extra units on insulin to your meal-time Humalog  dose if your blood sugars are higher than 155. Use the scale below to help guide you:   Blood sugar before meal Number of units to inject  Less than 155 0 unit  156 -  180 1 units  181 -  205 2 units  206 -  230 3 units  231 -  255 4 units  256 -  280 5 units  281 -  305 6 units  306 -  330 7 units  331 -  355 8 units  356 - 380 9 units    HOW TO TREAT LOW BLOOD SUGARS (Blood sugar LESS THAN 70 MG/DL) Please follow the RULE OF 15 for the treatment of hypoglycemia treatment (when your (blood sugars are less than 70 mg/dL)   STEP 1: Take 15 grams of carbohydrates when your blood sugar is low, which includes:  3-4 GLUCOSE TABS  OR 3-4 OZ OF JUICE OR REGULAR SODA OR ONE TUBE OF GLUCOSE GEL    STEP 2: RECHECK blood sugar in 15 MINUTES STEP 3: If your blood sugar is still low at the 15 minute recheck --> then, go back to STEP 1 and treat AGAIN with another 15 grams of carbohydrates.

## 2022-01-19 ENCOUNTER — Other Ambulatory Visit: Payer: Self-pay

## 2022-01-20 ENCOUNTER — Ambulatory Visit (INDEPENDENT_AMBULATORY_CARE_PROVIDER_SITE_OTHER): Payer: Self-pay | Admitting: Gastroenterology

## 2022-01-20 ENCOUNTER — Encounter: Payer: Self-pay | Admitting: Gastroenterology

## 2022-01-20 VITALS — BP 106/60 | HR 86 | Ht 64.0 in | Wt 164.5 lb

## 2022-01-20 DIAGNOSIS — K275 Chronic or unspecified peptic ulcer, site unspecified, with perforation: Secondary | ICD-10-CM

## 2022-01-20 DIAGNOSIS — K5909 Other constipation: Secondary | ICD-10-CM

## 2022-01-20 NOTE — Patient Instructions (Addendum)
I wish that the Linzess had worked. But, we have lots of other options.   I recommend that you eat at least 25-30 grams of fiber daily and drink at least 64 ounces of water daily (or even more!). You will want to gradually increase the fiber in your diet to avoid bloating. You may increase the fiber through diet and through fiber supplements including psyllium and methycellulose.   Natural laxatives include prunes, apples, apricots, cherries, peaches, pears, aloe, rhubarb, kiwi, bananas, mango, papaya, and watermelon. In particular, two kiwi a day has been show to cause less likely to cause bloating than prunes or psyllium.  As long as you have healthy kidneys, another options is using magnesium oxide supplements. I recommend starting with 400-500 mg daily. You could increase the dose to [id number] mg daily after one week if that doesn't seem to be helping.   We discussed trying Trulance 3 mg daily. I gave you 6 days of samples. Let me know if this helps and we can try to get a prescription approved by your insurance.   I will look forward to seeing you after you return from [place].

## 2022-01-20 NOTE — Progress Notes (Signed)
Referring Provider: Ladell Pier, MD Primary Care Physician:  Ladell Pier, MD  Chief Complaint:  Constipation   IMPRESSION:  Chronic constipation without alarm features. Thought to be slow transit constipation. Not responding to Linzess.  Perforated ulcer repair: H pylori negative. No follow-up needed at this time.   Personal history of colon cancer. No additional surveillance recommended.    PLAN: - Dietary recommendations (see patient instructions) including magnesium oxide - Trial of Trulance 3 mg daily (samples provided) if this works, will work to get it approved - Dulcolax PRN - Office visit in May 2024, earlier if needed    HPI: [name] [name] is a 76 y.o. female who returns in follow-up after endoscopic evaluation. Her daughter accompanies her to this appointment and contributes to the history. She has a history of colon cancer diagnosed while in [place] in 2004 treated with surgery and chemotherapy, HTN, Type 2 diabetes insulin dep, hepatitis C, and recent diagnosis of hepatocellular carcinoma diagnosed intraoperatively during [place] patch repair of a perforated gastric ulcer 08/09/20. She is currently receiving immunotherapy for her [place]. Last AFP 6.1.   EGD 04/23/21 showed gastritis but was otherwise normal. Biopsies were negative for H pylori.  Screening colonoscopy requested by the patient and her daughter has been unsuccessful x 2 due to poor prep/retained stool. Endoscopic evaluation showed pancolonic diverticulosis, colonic anastomosis, a hyperplastic rectal polyp and a moderate amount of stool.  The poor prep occurred in the setting of chronic constipation despite a two day bowel prep.   Returns today after trying higher dose of Linzess for her constipation. She is having a bowel movement daily. She has ongoing straining and a sense of incomplete evacuation. Her primary concern is hard stools. This will respond to Dulcolax.  She is drinking 2L  of water daily.   She is going to [place] in January for 4 months.      Past Medical History:  Diagnosis Date   Colon cancer ([place])    Diabetes mellitus without complication ([place])    Glaucoma    Goals of care, counseling/discussion 09/01/2020   Hepatitis C    Hepatocellular carcinoma ([place])    Hypertension     Past Surgical History:  Procedure Laterality Date   COLON RESECTION  2005   In [place] - ?colon cancer   IR IMAGING GUIDED PORT INSERTION  09/18/2020   LAPAROSCOPIC GASTRIC RESECTION N/A 08/09/2020   Procedure: LAPAROSCOPIC EXPLORATION OF ABDOMEN, PERFORATED ULCER REPAIR, LIVER BIOPSY;  Surgeon: Michael Boston, MD;  Location: [facility];  Service: General;  Laterality: N/A;    Current Outpatient Medications  Medication Sig Dispense Refill   brimonidine (ALPHAGAN) 0.2 % ophthalmic solution 1 drop 2 (two) times daily.     gabapentin (NEURONTIN) 300 MG capsule Take 1 capsule (300 mg total) by mouth once nightly at bedtime. 30 capsule 3   glucose blood (TRUE METRIX BLOOD GLUCOSE TEST) test strip Use as instructed 100 each 12   insulin glargine (LANTUS SOLOSTAR) 100 UNIT/ML Solostar Pen Inject 12 Units into the skin daily. 15 mL 6   insulin lispro (HUMALOG KWIKPEN) 200 UNIT/ML KwikPen Max daily 100 units 90 mL 3   insulin lispro (HUMALOG KWIKPEN) 200 UNIT/ML KwikPen Max daily dose 100 units. 15 mL 0   Insulin Pen Needle 32G X 4 MM MISC Use with insulins daily. 400 each 3   losartan (COZAAR) 100 MG tablet Take 1 tablet (100 mg total) by mouth at bedtime. 30 tablet 6   Magnesium  400 MG TABS Take 1 tablet by mouth daily at 6 (six) AM.     NIFEdipine (PROCARDIA-XL/NIFEDICAL-XL) 30 MG 24 hr tablet Take 1 tablet (30 mg total) by mouth in the morning and at bedtime. 180 tablet 0   pantoprazole (PROTONIX) 40 MG tablet Take 1 tablet (40 mg total) by mouth 2 (two) times daily. (Patient taking differently: Take 40 mg by mouth daily.) 180 tablet 3   predniSONE (DELTASONE) 20 MG tablet Take 1  tablet (20 mg total) by mouth daily with breakfast. 30 tablet 0   silver sulfADIAZINE (SILVADENE) 1 % cream Apply to the open, ulcerated areas of the skin once daily as needed. 400 g 1   silver sulfADIAZINE (SILVADENE) 1 % cream Apply to the open, ulcerated areas of the skin once daily as needed. 400 g 1   Sofosbuvir-Velpatasvir (EPCLUSA) 400-100 MG TABS Take 1 tablet by mouth daily. 30 tablet 2   triamcinolone ointment (KENALOG) 0.1 % Apply 1 Application topically 2 (two) times daily. 80 g 1   Vitamin D, Cholecalciferol, 10 MCG (400 UNIT) TABS Take 1 tablet by mouth daily at 6 (six) AM.     No current facility-administered medications for this visit.   Facility-Administered Medications Ordered in Other Visits  Medication Dose Route Frequency Provider Last Rate Last Admin   sodium chloride flush (NS) 0.9 % injection 10 mL  10 mL Intravenous PRN Celso Amy, NP   10 mL at 12/18/20 1009    Allergies as of 01/20/2022 - Review Complete 01/18/2022  Allergen Reaction Noted   Bactrim [sulfamethoxazole-trimethoprim] Swelling 08/08/2020   Clarithromycin Swelling and Rash 09/01/2020    Family History  Problem Relation Age of Onset   Cancer Father        type unknown, was on the leg   Other Daughter        Acoustic neuroma   Breast cancer Neg Hx    Colon cancer Neg Hx    Esophageal cancer Neg Hx    Rectal cancer Neg Hx    Stomach cancer Neg Hx     Physical Exam: General:   Alert,  well-nourished, pleasant and cooperative in NAD Head:  Normocephalic and atraumatic. Eyes:  Sclera clear, no icterus.   Conjunctiva pink. Abdomen:  Soft, nontender, nondistended, normal bowel sounds, no rebound or guarding. No hepatosplenomegaly.   Neurologic:  Alert and  oriented x4;  grossly nonfocal Skin:  Intact without significant lesions or rashes. Psych:  Alert and cooperative. Normal mood and affect.    [name] [name] L. Tarri Glenn, MD, MPH 01/20/2022, 10:59 AM

## 2022-01-21 ENCOUNTER — Other Ambulatory Visit: Payer: Self-pay

## 2022-01-22 ENCOUNTER — Ambulatory Visit: Payer: Self-pay | Attending: Family Medicine

## 2022-01-22 DIAGNOSIS — E1159 Type 2 diabetes mellitus with other circulatory complications: Secondary | ICD-10-CM

## 2022-01-23 ENCOUNTER — Encounter: Payer: Self-pay | Admitting: Hematology & Oncology

## 2022-01-23 LAB — BASIC METABOLIC PANEL
BUN/Creatinine Ratio: 15 (ref 12–28)
BUN: 11 mg/dL (ref 8–27)
CO2: 20 mmol/L (ref 20–29)
Calcium: 10.3 mg/dL (ref 8.7–10.3)
Chloride: 104 mmol/L (ref 96–106)
Creatinine, Ser: 0.72 mg/dL (ref 0.57–1.00)
Glucose: 159 mg/dL — ABNORMAL HIGH (ref 70–99)
Potassium: 4.3 mmol/L (ref 3.5–5.2)
Sodium: 140 mmol/L (ref 134–144)
eGFR: 87 mL/min/{1.73_m2} (ref >59)

## 2022-01-28 ENCOUNTER — Encounter: Payer: Self-pay | Admitting: Internal Medicine

## 2022-01-28 ENCOUNTER — Ambulatory Visit (INDEPENDENT_AMBULATORY_CARE_PROVIDER_SITE_OTHER): Payer: Self-pay | Admitting: Internal Medicine

## 2022-01-28 VITALS — BP 122/80 | HR 93 | Ht 64.0 in | Wt 167.0 lb

## 2022-01-28 DIAGNOSIS — Z794 Long term (current) use of insulin: Secondary | ICD-10-CM

## 2022-01-28 DIAGNOSIS — E1165 Type 2 diabetes mellitus with hyperglycemia: Secondary | ICD-10-CM

## 2022-01-28 NOTE — Progress Notes (Signed)
Name: [name] [name]  MRN/ DOB: [id number], [birth year]   Age/ Sex: 76 y.o., female    PCP: Ladell Pier, MD   Reason for Endocrinology Evaluation: Type 2 Diabetes Mellitus     Date of Initial Endocrinology Visit: 01/18/2022    PATIENT IDENTIFIER: [name] [name] is a 76 y.o. female with a past medical history of Hx of perforated gastric ulcer, chronic hepatitis C, HTN, hepatocellular carcinoma, T2DM. The patient presented for initial endocrinology clinic visit on 01/18/2022 for consultative assistance with her diabetes management.    HPI: [name] [name] is here with her daughter    Diagnosed with DM 2005 Prior Medications tried/Intolerance: was on oral agents years ago , bit does not recall  Hemoglobin A1c has ranged from 7.3% in 06/2021, peaking at 7.5% in 2023.  Of note, the pt follows with oncology ( Dr. Marin [name]) for hepatocellular carcinoma, currently on Nivolumab infusions   She is on Prednisone  through dermatology for lichenoid dermatitis   ON her initial visit to our clinic her A1c 6.7% but she was on Humalog Mix Plus regular humalog and was guesstimating her  doses. We switched  to basal/prandial insulin   SUBJECTIVE:   During the last visit (01/18/2022): A1c 6.7%   Today (01/28/22): [facility] [name] is here for a follow up on diabetes management. She checks her blood sugars 4 times daily. The patient has  had hypoglycemic episodes since the last clinic visit, which typically occur bedtime       HOME DIABETES REGIMEN: Lantus 12 units daily Humalog 20 units with each meal 3 times daily Correction factor: Humalog (BG -130/25)       Statin: No ACE-I/ARB: Yes    METER DOWNLOAD SUMMARY: Did not bring      DIABETIC COMPLICATIONS: Microvascular complications:   Denies: CKD Last eye exam: Completed   Macrovascular complications:   Denies: CAD, PVD, CVA   PAST HISTORY: Past Medical History:  Past  Medical History:  Diagnosis Date   Colon cancer ([place])    Diabetes mellitus without complication ([place])    Glaucoma    Goals of care, counseling/discussion 09/01/2020   Hepatitis C    Hepatocellular carcinoma ([place])    Hypertension    Past Surgical History:  Past Surgical History:  Procedure Laterality Date   COLON RESECTION  2005   In [place] - ?colon cancer   IR IMAGING GUIDED PORT INSERTION  09/18/2020   LAPAROSCOPIC GASTRIC RESECTION N/A 08/09/2020   Procedure: LAPAROSCOPIC EXPLORATION OF ABDOMEN, PERFORATED ULCER REPAIR, LIVER BIOPSY;  Surgeon: Michael Boston, MD;  Location: [facility];  Service: General;  Laterality: N/A;    Social History:  reports that she has never smoked. She has never used smokeless tobacco. She reports that she does not currently use alcohol. She reports that she does not use drugs. Family History:  Family History  Problem Relation Age of Onset   Cancer Father        type unknown, was on the leg   Other Daughter        Acoustic neuroma   Breast cancer Neg Hx    Colon cancer Neg Hx    Esophageal cancer Neg Hx    Rectal cancer Neg Hx    Stomach cancer Neg Hx      HOME MEDICATIONS: Allergies as of 01/28/2022       Reactions   Bactrim [sulfamethoxazole-trimethoprim] Swelling   Clarithromycin Swelling, Rash   Daughter is unsure if Clarithromycin or Amoxicillin caused  rash and swelling. Patient states no allergy to amoxicillan - has taken this recently with no issues. (12/02/21)        Medication List        Accurate as of January 28, 2022  1:24 PM. If you have any questions, ask your nurse or doctor.          Basaglar KwikPen 100 UNIT/ML Inject 12 Units into the skin daily.   brimonidine 0.2 % ophthalmic solution Commonly known as: ALPHAGAN 1 drop 2 (two) times daily.   gabapentin 300 MG capsule Commonly known as: NEURONTIN Take 1 capsule (300 mg total) by mouth once nightly at bedtime.   HumaLOG KwikPen 200 UNIT/ML KwikPen Generic  drug: insulin lispro Max daily 100 units   HumaLOG KwikPen 200 UNIT/ML KwikPen Generic drug: insulin lispro Max daily dose 100 units.   losartan 100 MG tablet Commonly known as: COZAAR Take 1 tablet (100 mg total) by mouth at bedtime.   Magnesium 400 MG Tabs Take 1 tablet by mouth daily at 6 (six) AM.   NIFEdipine 30 MG 24 hr tablet Commonly known as: PROCARDIA-XL/NIFEDICAL-XL Take 1 tablet (30 mg total) by mouth in the morning and at bedtime.   pantoprazole 40 MG tablet Commonly known as: Protonix Take 1 tablet (40 mg total) by mouth 2 (two) times daily. What changed: when to take this   predniSONE 20 MG tablet Commonly known as: DELTASONE Take 1 tablet (20 mg total) by mouth daily with breakfast.   Sofosbuvir-Velpatasvir 400-100 MG Tabs Commonly known as: Epclusa Take 1 tablet by mouth daily.   SSD 1 % cream Generic drug: silver sulfADIAZINE Apply to the open, ulcerated areas of the skin once daily as needed.   SSD 1 % cream Generic drug: silver sulfADIAZINE Apply to the open, ulcerated areas of the skin once daily as needed.   TechLite Pen Needles 32G X 4 MM Misc Generic drug: Insulin Pen Needle Use with insulins daily.   triamcinolone ointment 0.1 % Commonly known as: KENALOG Apply 1 Application topically 2 (two) times daily.   True Metrix Blood Glucose Test test strip Generic drug: glucose blood Use as instructed   Vitamin D (Cholecalciferol) 10 MCG (400 UNIT) Tabs Take 1 tablet by mouth daily at 6 (six) AM.         ALLERGIES: Allergies  Allergen Reactions   Bactrim [Sulfamethoxazole-Trimethoprim] Swelling   Clarithromycin Swelling and Rash    Daughter is unsure if Clarithromycin or Amoxicillin caused rash and swelling.  Patient states no allergy to amoxicillan - has taken this recently with no issues. (12/02/21)     REVIEW OF SYSTEMS: A comprehensive ROS was conducted with the patient and is negative except as per HPI     OBJECTIVE:    VITAL SIGNS: BP 122/80 (BP Location: Left Arm, Patient Position: Sitting, Cuff Size: Large)   Pulse 93   Ht _0  (1.626 m)   Wt 167 lb (75.8 kg)   SpO2 96%   BMI 28.67 kg/m    PHYSICAL EXAM:  General: Pt appears well and is in NAD  Neck: General: Supple without adenopathy or carotid bruits. Thyroid: Thyroid size normal.  No goiter or nodules appreciated.   Lungs: Clear with good BS bilat with no rales, rhonchi, or wheezes  Heart: RRR   Extremities:  Lower extremities - No pretibial edema  Neuro: MS is good with appropriate affect, pt is alert and Ox3      DATA REVIEWED:  Lab Results  Component  Value Date   HGBA1C 6.7 (A) 01/18/2022   HGBA1C 7.5 (A) 10/30/2021   HGBA1C 7.3 (A) 06/19/2021    Latest Reference Range & Units 01/22/22 08:49  Sodium 134 - 144 mmol/L 140  Potassium 3.5 - 5.2 mmol/L 4.3  Chloride 96 - 106 mmol/L 104  CO2 20 - 29 mmol/L 20  Glucose 70 - 99 mg/dL 159 (H)  BUN 8 - 27 mg/dL 11  Creatinine 0.57 - 1.00 mg/dL 0.72  Calcium 8.7 - 10.3 mg/dL 10.3  BUN/Creatinine Ratio 12 - 28  15  eGFR >59 mL/min/1.73 87     ASSESSMENT / PLAN / RECOMMENDATIONS:   1) Type 2 Diabetes Mellitus, Optimally controlled, Without complications - Most recent A1c of 6.7 %. Goal A1c < 7.0 %.    -Patient did bring her glucose logs and it seems that her fasting BG's are optimal, hence will not change Lantus -She has followed directions for Humalog for the most part except last night her presupper BG was 76 mg/DL, she took 20 units of Humalog, with a bedtime BG of 204 mg/DL but the patient documented taking another 20 units of Humalog.  Patient was supposed to only take 2 units at bedtime based on the correction scale.  We again discussed fatal consequences of hypoglycemia with estimating insulin doses.  She did endorse waking up around 2 AM having palpitation and the need to eat an apple -I will reduce her suppertime Humalog -She was also advised to reduce Humalog dose by 4  units with each meal while tapering on prednisone  -Patient has no health insurance and I am not aware of any patient assistance programs for CGM technology  MEDICATIONS: -Continue Lantus 12 units daily -Change Humalog 20 units with breakfast, 20 units with lunch, and 16 units with supper -Correction factor: Humalog (BG -130/25)  EDUCATION / INSTRUCTIONS: BG monitoring instructions: Patient is instructed to check her blood sugars 3 times a day, before each meal . Call [place] [facility] if: BG persistently < 70  I reviewed the Rule of 15 for the treatment of hypoglycemia in detail with the patient. Literature supplied.   2) Diabetic complications:  Eye: Does not have known diabetic retinopathy.  Neuro/ Feet: Does not have known diabetic peripheral neuropathy. Renal: Patient does not have known baseline CKD. She is  on an ACEI/ARB at present.    Follow-up in 4 months   Signed electronically by: Mack Guise, MD  [facility]  [facility] [address] [place], [place] [address] Phone: [phone] FAX: [phone]   CC: Ladell Pier, MD [place] [address] Phone: [phone]  Fax: [phone]    Return to Endocrinology clinic as below: Future Appointments  Date Time Provider [place]  02/11/2022  7:45 AM [facility] LAB [facility] None  02/11/2022  8:00 AM [facility] INJ NURSE [facility] None  02/11/2022  8:15 AM [name] [name], Rudell Cobb, MD [facility] None  02/11/2022  8:30 AM [facility] A1 [facility] None  03/18/2022  2:30 PM Ladell Pier, MD [facility] None  06/02/2022  8:45 AM [facility] LAB [facility]  06/23/2022 12:10 PM Paelyn Smick, Melanie Crazier, MD [facility] None  06/23/2022  3:30 PM Vu, Rockey Situ, MD [facility]

## 2022-01-28 NOTE — Patient Instructions (Addendum)
  Continue Lantus 12 units ONCE daily  Change Humalog 20 units with Breakfast, 20 units with Lunch and 16 units with Supper Humalog correctional insulin: ADD extra units on insulin to your meal-time Humalog  dose if your blood sugars are higher than 155. Use the scale below to help guide you:   Blood sugar before meal Number of units to inject  Less than 155 0 unit  156 -  180 1 units  181 -  205 2 units  206 -  230 3 units  231 -  255 4 units  256 -  280 5 units  281 -  305 6 units  306 -  330 7 units  331 -  355 8 units  356 - 380 9 units     WHEN PREDNISONE IS DECREASED:  Change Humalog 16 units with Breakfast, 16 units with Lunch and 12 units with Supper  NO change to lantus or the scale    HOW TO TREAT LOW BLOOD SUGARS (Blood sugar LESS THAN 70 MG/DL) Please follow the RULE OF 15 for the treatment of hypoglycemia treatment (when your (blood sugars are less than 70 mg/dL)   STEP 1: Take 15 grams of carbohydrates when your blood sugar is low, which includes:  3-4 GLUCOSE TABS  OR 3-4 OZ OF JUICE OR REGULAR SODA OR ONE TUBE OF GLUCOSE GEL    STEP 2: RECHECK blood sugar in 15 MINUTES STEP 3: If your blood sugar is still low at the 15 minute recheck --> then, go back to STEP 1 and treat AGAIN with another 15 grams of carbohydrates.

## 2022-01-31 ENCOUNTER — Other Ambulatory Visit: Payer: Self-pay

## 2022-02-02 ENCOUNTER — Encounter: Payer: Self-pay | Admitting: Hematology & Oncology

## 2022-02-02 ENCOUNTER — Other Ambulatory Visit: Payer: Self-pay

## 2022-02-02 MED ORDER — CLOBETASOL PROPIONATE 0.05 % EX OINT
TOPICAL_OINTMENT | Freq: Two times a day (BID) | CUTANEOUS | 5 refills | Status: DC
  Filled 2022-02-02: qty 60, 30d supply, fill #0
  Filled 2022-02-11: qty 60, 30d supply, fill #1

## 2022-02-02 MED ORDER — PREDNISONE 5 MG PO TABS
ORAL_TABLET | ORAL | 0 refills | Status: DC
  Filled 2022-02-02: qty 405, 150d supply, fill #0

## 2022-02-02 MED ORDER — SILVER SULFADIAZINE 1 % EX CREA
TOPICAL_CREAM | CUTANEOUS | 1 refills | Status: DC
  Filled 2022-02-02: qty 400, 30d supply, fill #0

## 2022-02-04 ENCOUNTER — Other Ambulatory Visit: Payer: Self-pay

## 2022-02-05 ENCOUNTER — Other Ambulatory Visit: Payer: Self-pay

## 2022-02-11 ENCOUNTER — Other Ambulatory Visit: Payer: Self-pay

## 2022-02-11 ENCOUNTER — Encounter: Payer: Self-pay | Admitting: Hematology & Oncology

## 2022-02-11 ENCOUNTER — Other Ambulatory Visit (HOSPITAL_COMMUNITY): Payer: Self-pay

## 2022-02-11 ENCOUNTER — Inpatient Hospital Stay: Payer: Self-pay

## 2022-02-11 ENCOUNTER — Encounter: Payer: Self-pay | Admitting: Internal Medicine

## 2022-02-11 ENCOUNTER — Inpatient Hospital Stay (HOSPITAL_BASED_OUTPATIENT_CLINIC_OR_DEPARTMENT_OTHER): Payer: Self-pay | Admitting: Hematology & Oncology

## 2022-02-11 ENCOUNTER — Inpatient Hospital Stay: Payer: Self-pay | Attending: Hematology & Oncology

## 2022-02-11 ENCOUNTER — Telehealth: Payer: Self-pay | Admitting: Pharmacist

## 2022-02-11 ENCOUNTER — Telehealth: Payer: Self-pay

## 2022-02-11 VITALS — BP 136/57 | HR 86 | Temp 98.7°F | Resp 18 | Ht 64.0 in | Wt 162.0 lb

## 2022-02-11 VITALS — BP 130/61 | HR 83 | Resp 17

## 2022-02-11 DIAGNOSIS — C220 Liver cell carcinoma: Secondary | ICD-10-CM

## 2022-02-11 DIAGNOSIS — B1920 Unspecified viral hepatitis C without hepatic coma: Secondary | ICD-10-CM | POA: Insufficient documentation

## 2022-02-11 DIAGNOSIS — Z5112 Encounter for antineoplastic immunotherapy: Secondary | ICD-10-CM | POA: Insufficient documentation

## 2022-02-11 LAB — CMP (CANCER CENTER ONLY)
ALT: 22 U/L (ref 0–44)
AST: 14 U/L — ABNORMAL LOW (ref 15–41)
Albumin: 3.9 g/dL (ref 3.5–5.0)
Alkaline Phosphatase: 102 U/L (ref 38–126)
Anion gap: 7 (ref 5–15)
BUN: 14 mg/dL (ref 8–23)
CO2: 26 mmol/L (ref 22–32)
Calcium: 9.9 mg/dL (ref 8.9–10.3)
Chloride: 105 mmol/L (ref 98–111)
Creatinine: 0.78 mg/dL (ref 0.44–1.00)
GFR, Estimated: >60 mL/min (ref >60)
Glucose, Bld: 185 mg/dL — ABNORMAL HIGH (ref 70–99)
Potassium: 3.8 mmol/L (ref 3.5–5.1)
Sodium: 138 mmol/L (ref 135–145)
Total Bilirubin: 0.7 mg/dL (ref 0.3–1.2)
Total Protein: 6.7 g/dL (ref 6.5–8.1)

## 2022-02-11 LAB — CBC WITH DIFFERENTIAL (CANCER CENTER ONLY)
Abs Immature Granulocytes: 0.02 10*3/uL (ref 0.00–0.07)
Basophils Absolute: 0 10*3/uL (ref 0.0–0.1)
Basophils Relative: 0 %
Eosinophils Absolute: 0 10*3/uL (ref 0.0–0.5)
Eosinophils Relative: 0 %
HCT: 39.9 % (ref 36.0–46.0)
Hemoglobin: 13.4 g/dL (ref 12.0–15.0)
Immature Granulocytes: 0 %
Lymphocytes Relative: 44 %
Lymphs Abs: 3 10*3/uL (ref 0.7–4.0)
MCH: 28.9 pg (ref 26.0–34.0)
MCHC: 33.6 g/dL (ref 30.0–36.0)
MCV: 86.2 fL (ref 80.0–100.0)
Monocytes Absolute: 0.5 10*3/uL (ref 0.1–1.0)
Monocytes Relative: 7 %
Neutro Abs: 3.3 10*3/uL (ref 1.7–7.7)
Neutrophils Relative %: 49 %
Platelet Count: 174 10*3/uL (ref 150–400)
RBC: 4.63 MIL/uL (ref 3.87–5.11)
RDW: 14.6 % (ref 11.5–15.5)
WBC Count: 6.8 10*3/uL (ref 4.0–10.5)
nRBC: 0 % (ref 0.0–0.2)

## 2022-02-11 LAB — LACTATE DEHYDROGENASE: LDH: 163 U/L (ref 98–192)

## 2022-02-11 MED ORDER — HEPARIN SOD (PORK) LOCK FLUSH 100 UNIT/ML IV SOLN
500.0000 [IU] | Freq: Once | INTRAVENOUS | Status: AC | PRN
  Administered 2022-02-11: 500 [IU]

## 2022-02-11 MED ORDER — LENVATINIB (8 MG DAILY DOSE) 2 X 4 MG PO CPPK
8.0000 mg | ORAL_CAPSULE | Freq: Every day | ORAL | 0 refills | Status: DC
  Filled 2022-02-11: qty 180, 90d supply, fill #0

## 2022-02-11 MED ORDER — SODIUM CHLORIDE 0.9 % IV SOLN: Freq: Once | INTRAVENOUS | Status: AC

## 2022-02-11 MED ORDER — SODIUM CHLORIDE 0.9% FLUSH
10.0000 mL | INTRAVENOUS | Status: DC | PRN
  Administered 2022-02-11: 10 mL

## 2022-02-11 MED ORDER — SODIUM CHLORIDE 0.9 % IV SOLN
480.0000 mg | Freq: Once | INTRAVENOUS | Status: AC
  Administered 2022-02-11: 480 mg via INTRAVENOUS
  Filled 2022-02-11: qty 48

## 2022-02-11 MED ORDER — SORAFENIB TOSYLATE 200 MG PO TABS
200.0000 mg | ORAL_TABLET | Freq: Two times a day (BID) | ORAL | 0 refills | Status: DC
  Filled 2022-02-11: qty 180, 90d supply, fill #0

## 2022-02-11 NOTE — Addendum Note (Signed)
Addended by: Burney Gauze R on: 02/11/2022 12:58 PM   Modules accepted: Orders

## 2022-02-11 NOTE — Progress Notes (Signed)
Hematology and Oncology Follow Up Visit  [name] [name] [id number] [birth year] 76 y.o. 02/11/2022   Principle Diagnosis:  Hepatocellular carcinoma-multifocal Hepatitis C   Current Therapy:        Status post cycle 1 of nivolumab/ipilimumab -- d/c on 10/13/2020 due to hepatic toxicity Nivolumab 480 mg IV every 4 weeks - s/p cycle #19 -- started 11/21/2020 --changed to 480 mg on 08/2021 Nexavar 200 mg po BID -- to be taken while in [place] -- start on 02/26/2022   Interim History:  [name] is here today for follow-up and treatment.  This will be her final treatment for this year and also the final treatment in the [place] for a while.  She will be going to [place] next Wednesday.  She will be gone for at least 2 months.  From what her daughter says, she will be back in late April.  We last did her alpha-fetoprotein at the end of November.  The alpha-fetoprotein was 5.8.  Her skin looks okay.  She still has this unusual reaction.  Is hard to say what the source of the reaction is.  She is on a prednisone taper.  Dermatology has been doing a great job with this.  Her blood sugars have been on the higher side because of the prednisone that she is on.  Hopefully with the prednisone taper, the blood sugars will improve.  While she is in [place], I think she should be on treatment for the hepatocellular carcinoma.  I would get her on Nexavar.  Put her on a low-dose at 200 mg p.o. twice daily.  I think this to be reasonable.  She is hopefully will be able to get this before she goes over to [place].  She has had no problems with pain.  There is been no nausea or vomiting.  She has had no bleeding.  There is been no diarrhea.  She has had no fever.  I told her to make sure she wears a mask while she is flying.  I am sure that COVID can be a problem while she is flying and while she is over in [place].  Currently, I would say that her performance status is  probably ECOG 1.   Medications:  Allergies as of 02/11/2022       Reactions   Bactrim [sulfamethoxazole-trimethoprim] Swelling   Clarithromycin Swelling, Rash   Daughter is unsure if Clarithromycin or Amoxicillin caused rash and swelling. Patient states no allergy to amoxicillan - has taken this recently with no issues. (12/02/21)        Medication List        Accurate as of February 11, 2022  8:38 AM. If you have any questions, ask your nurse or doctor.          Basaglar KwikPen 100 UNIT/ML Inject 12 Units into the skin daily.   brimonidine 0.2 % ophthalmic solution Commonly known as: ALPHAGAN 1 drop 2 (two) times daily.   clobetasol ointment 0.05 % Commonly known as: TEMOVATE Apply twice a day on affected areas   gabapentin 300 MG capsule Commonly known as: NEURONTIN Take 1 capsule (300 mg total) by mouth once nightly at bedtime.   HumaLOG KwikPen 200 UNIT/ML KwikPen Generic drug: insulin lispro Max daily 100 units   HumaLOG KwikPen 200 UNIT/ML KwikPen Generic drug: insulin lispro Max daily dose 100 units.   losartan 100 MG tablet Commonly known as: COZAAR Take 1 tablet (100 mg total) by mouth at bedtime.  Magnesium 400 MG Tabs Take 1 tablet by mouth daily at 6 (six) AM.   NIFEdipine 30 MG 24 hr tablet Commonly known as: PROCARDIA-XL/NIFEDICAL-XL Take 1 tablet (30 mg total) by mouth in the morning and at bedtime.   pantoprazole 40 MG tablet Commonly known as: Protonix Take 1 tablet (40 mg total) by mouth 2 (two) times daily. What changed: when to take this   predniSONE 20 MG tablet Commonly known as: DELTASONE Take 1 tablet (20 mg total) by mouth daily with breakfast.   predniSONE 5 MG tablet Commonly known as: DELTASONE Take 3.5 tablets (17.5 mg total) by mouth daily for 30 days, THEN 3 tablets (15 mg total) daily for 30 days, THEN 2.5 tablets (12.5 mg total) daily for 30 days, THEN 2.5 tablets (12.5 mg total) daily for 30 days, THEN 2 tablets  (10 mg total) daily for 30 days.   Sofosbuvir-Velpatasvir 400-100 MG Tabs Commonly known as: Epclusa Take 1 tablet by mouth daily.   SORAfenib 200 MG tablet Commonly known as: NEXAVAR Take 1 tablet (200 mg total) by mouth 2 (two) times daily. Give on an empty stomach 1 hour before or 2 hours after meals. Started by: Volanda Napoleon, MD   SSD 1 % cream Generic drug: silver sulfADIAZINE Apply to the open, ulcerated areas of the skin once daily as needed. What changed: Another medication with the same name was removed. Continue taking this medication, and follow the directions you see here. Changed by: Volanda Napoleon, MD   SSD 1 % cream Generic drug: silver sulfADIAZINE Apply to the open, ulcerated areas of the skin once daily as needed. What changed: Another medication with the same name was removed. Continue taking this medication, and follow the directions you see here. Changed by: Volanda Napoleon, MD   TechLite Pen Needles 32G X 4 MM Misc Generic drug: Insulin Pen Needle Use with insulins daily.   triamcinolone ointment 0.1 % Commonly known as: KENALOG Apply 1 Application topically 2 (two) times daily.   True Metrix Blood Glucose Test test strip Generic drug: glucose blood Use as instructed   Vitamin D (Cholecalciferol) 10 MCG (400 UNIT) Tabs Take 1 tablet by mouth daily at 6 (six) AM.        Allergies:  Allergies  Allergen Reactions   Bactrim [Sulfamethoxazole-Trimethoprim] Swelling   Clarithromycin Swelling and Rash    Daughter is unsure if Clarithromycin or Amoxicillin caused rash and swelling.  Patient states no allergy to amoxicillan - has taken this recently with no issues. (12/02/21)    Past Medical History, Surgical history, Social history, and Family History were reviewed and updated.  Review of Systems: Review of Systems  Constitutional: Negative.   HENT: Negative.    Eyes: Negative.   Respiratory: Negative.    Cardiovascular: Negative.    Gastrointestinal: Negative.   Genitourinary: Negative.   Musculoskeletal: Negative.   Skin: Negative.   Neurological:  Positive for tingling.  Endo/Heme/Allergies: Negative.   Psychiatric/Behavioral: Negative.       Physical Exam:  height is '5\' 4"'$  (1.626 m) and weight is 162 lb (73.5 kg). Her oral temperature is 98.7 F (37.1 C). Her blood pressure is 136/57 (abnormal) and her pulse is 86. Her respiration is 18 and oxygen saturation is 98%.   Wt Readings from Last 3 Encounters:  02/11/22 162 lb (73.5 kg)  01/28/22 167 lb (75.8 kg)  01/20/22 164 lb 8 oz (74.6 kg)    Physical Exam Vitals reviewed.  HENT:  Head: Normocephalic and atraumatic.  Eyes:     Pupils: Pupils are equal, round, and reactive to light.  Cardiovascular:     Rate and Rhythm: Normal rate and regular rhythm.     Heart sounds: Normal heart sounds.  Pulmonary:     Effort: Pulmonary effort is normal.     Breath sounds: Normal breath sounds.  Abdominal:     General: Bowel sounds are normal.     Palpations: Abdomen is soft.     Comments: Abdominal exam shows a well-healed laparotomy scar.  She has no fluid wave.  There is no guarding or rebound tenderness to palpation.  There is no palpable liver or spleen tip.  Musculoskeletal:        General: No tenderness or deformity. Normal range of motion.     Cervical back: Normal range of motion.  Lymphadenopathy:     Cervical: No cervical adenopathy.  Skin:    General: Skin is warm and dry.     Findings: No erythema or rash.     Comments: Skin exam does show a extensive but somewhat healing papular rash on her lower legs.  This is below the knees.  She has some dressing on the foot on the right foot.  She has no open wounds.  There is no blisters.  Neurological:     Mental Status: She is alert and oriented to person, place, and time.  Psychiatric:        Behavior: Behavior normal.        Thought Content: Thought content normal.        Judgment: Judgment  normal.       Lab Results  Component Value Date   WBC 6.8 02/11/2022   HGB 13.4 02/11/2022   HCT 39.9 02/11/2022   MCV 86.2 02/11/2022   PLT 174 02/11/2022   Lab Results  Component Value Date   FERRITIN 603 (H) 10/02/2020   IRON 31 (L) 10/02/2020   TIBC 241 10/02/2020   UIBC 211 10/02/2020   IRONPCTSAT 13 (L) 10/02/2020   Lab Results  Component Value Date   RBC 4.63 02/11/2022   No results found for: "KPAFRELGTCHN", "LAMBDASER", "KAPLAMBRATIO" No results found for: "IGGSERUM", "IGA", "IGMSERUM" No results found for: "TOTALPROTELP", "ALBUMINELP", "A1GS", "A2GS", "BETS", "BETA2SER", "GAMS", "MSPIKE", "SPEI"   Chemistry      Component Value Date/Time   NA 140 01/22/2022 0849   K 4.3 01/22/2022 0849   CL 104 01/22/2022 0849   CO2 20 01/22/2022 0849   BUN 11 01/22/2022 0849   CREATININE 0.72 01/22/2022 0849   CREATININE 0.74 01/14/2022 0804   CREATININE 0.58 (L) 08/20/2021 1541      Component Value Date/Time   CALCIUM 10.3 01/22/2022 0849   ALKPHOS 141 (H) 01/14/2022 0804   AST 20 01/14/2022 0804   ALT 25 01/14/2022 0804   BILITOT 0.5 01/14/2022 0804       Impression and Plan: [name] is a very pleasant 75 yo female from [place] with hepatocellular carcinoma and Hepatitis C.   We will go ahead with her treatment today.  She has done incredibly well with the nivolumab.  [birth year] will be her birthday.  They will be her 77th birthday.  I am sure that she will enjoy this, particularly since she is going over to [place].  Again, we will have her on Nexavar while she is over in [place].  I told her to start the Nexavar about 2 weeks after she gets over to [place].  I gave her prescription to have her labs checked every 3 weeks while she is over in [place].  Her daughter says that she will be able to have lab work done.  Hopefully, we will be able to get the results faxed back to [place].  She will have the nivolumab today.  I have to believe that she will do  well with the nivolumab for quite a while.  Her blood sugars on the higher side today.  Again, a lot of this is from the prednisone that she is taking.  We will plan to get her back to see [place] when she returns from [place].  I am sure that she will have a wonderful time seeing her family.   Volanda Napoleon, MD 12/28/20238:38 AM

## 2022-02-11 NOTE — Telephone Encounter (Signed)
Received patient signatures. I will submit once I receive back MD signatures.   Berdine Addison, [facility] Patient [place]  [phone] (phone) [phone] (fax) 02/11/2022 1:43 PM

## 2022-02-11 NOTE — Telephone Encounter (Addendum)
Oral Oncology Pharmacist Encounter  Received new prescription for Lenvima (lenvatinib) for the treatment of hepatocellular cancer, planned for the duration while patient is out of the country. (Patient originally was going to be switched to Nexavar while out of the country, but since medication is only available as generic and there is no patient assistance available for sorafenib, this option is unaffordable for patient).  CBC w/ Diff and CMP from 02/11/22 assessed, MD planning starting patient on low dose 8 mg/d. No relevant lab abnormalities noted. Prescription dose and frequency assessed for appropriateness.   Current medication list in Epic reviewed, no relevant/significant DDIs with Lenvima identified.  Evaluated chart and no patient barriers to medication adherence noted.   Prescription has been e-scribed to the [facility] for benefits analysis and approval.  Oral Oncology Clinic will continue to follow for insurance authorization, copayment issues, initial counseling and start date.  Leron Croak, PharmD, BCPS, [facility] Hematology/Oncology Clinical Pharmacist [facility] and [place] [phone] 02/11/2022 1:03 PM

## 2022-02-11 NOTE — Telephone Encounter (Signed)
Oral Oncology Patient Advocate Encounter  Reached out and spoke with patient regarding PAP paperwork, explained that I would send it to their preferred email via DocuSign.   Confirmed email address: [email or web address].    Patient expressed understanding and consent.  Will follow up once paperwork has been signed and returned.   Berdine Addison, [facility] Patient [place]  [phone] (phone) [phone] (fax) 02/11/2022 1:37 PM

## 2022-02-11 NOTE — Telephone Encounter (Signed)
Oral Oncology Patient Advocate Encounter   Began application for assistance for Lenvima through [place].   Application will be submitted upon completion of necessary supporting documentation.   Eisai's phone number [phone].   I will continue to check the status until final determination.   Berdine Addison, [facility] Patient [place]  [phone] (phone) [phone] (fax) 02/11/2022 1:36 PM

## 2022-02-11 NOTE — Patient Instructions (Signed)
[facility]  Discharge Instructions: Thank you for choosing [facility] to provide your oncology and hematology care.   If you have a lab appointment with the [facility], please go directly to the [facility] and check in at the registration area.  Wear comfortable clothing and clothing appropriate for easy access to any Portacath or PICC line.   We strive to give you quality time with your provider. You may need to reschedule your appointment if you arrive late (15 or more minutes).  Arriving late affects you and other patients whose appointments are after yours.  Also, if you miss three or more appointments without notifying the office, you may be dismissed from the clinic at the provider's discretion.      For prescription refill requests, have your pharmacy contact our office and allow 72 hours for refills to be completed.    Today you received the following chemotherapy and/or immunotherapy agents Opdivo.      To help prevent nausea and vomiting after your treatment, we encourage you to take your nausea medication as directed.  BELOW ARE SYMPTOMS THAT SHOULD BE REPORTED IMMEDIATELY: *FEVER GREATER THAN 100.4 F (38 C) OR HIGHER *CHILLS OR SWEATING *NAUSEA AND VOMITING THAT IS NOT CONTROLLED WITH YOUR NAUSEA MEDICATION *UNUSUAL SHORTNESS OF BREATH *UNUSUAL BRUISING OR BLEEDING *URINARY PROBLEMS (pain or burning when urinating, or frequent urination) *BOWEL PROBLEMS (unusual diarrhea, constipation, pain near the anus) TENDERNESS IN MOUTH AND THROAT WITH OR WITHOUT PRESENCE OF ULCERS (sore throat, sores in mouth, or a toothache) UNUSUAL RASH, SWELLING OR PAIN  UNUSUAL VAGINAL DISCHARGE OR ITCHING   Items with * indicate a potential emergency and should be followed up as soon as possible or go to the Emergency Department if any problems should occur.  Please show the CHEMOTHERAPY ALERT CARD or IMMUNOTHERAPY ALERT CARD at check-in to the  Emergency Department and triage nurse. Should you have questions after your visit or need to cancel or reschedule your appointment, please contact [facility]  [phone] and follow the prompts.  Office hours are 8:00 a.m. to 4:30 p.m. Monday - Friday. Please note that voicemails left after 4:00 p.m. may not be returned until the following business day.  We are closed weekends and major holidays. You have access to a nurse at all times for urgent questions. Please call the main number to the clinic [phone] and follow the prompts.  For any non-urgent questions, you may also contact your provider using MyChart. We now offer e-Visits for anyone 37 and older to request care online for non-urgent symptoms. For details visit [email or web address].   Also download the MyChart app! Go to the app store, search "MyChart", open the app, select [facility], and log in with your MyChart username and password.

## 2022-02-12 ENCOUNTER — Other Ambulatory Visit: Payer: Self-pay

## 2022-02-12 ENCOUNTER — Encounter: Payer: Self-pay | Admitting: Hematology & Oncology

## 2022-02-12 ENCOUNTER — Other Ambulatory Visit: Payer: Self-pay | Admitting: Internal Medicine

## 2022-02-12 DIAGNOSIS — E1159 Type 2 diabetes mellitus with other circulatory complications: Secondary | ICD-10-CM

## 2022-02-12 MED ORDER — HUMALOG KWIKPEN 200 UNIT/ML ~~LOC~~ SOPN
PEN_INJECTOR | SUBCUTANEOUS | 0 refills | Status: DC
  Filled 2022-02-12: qty 60, 120d supply, fill #0

## 2022-02-12 MED ORDER — PANTOPRAZOLE SODIUM 40 MG PO TBEC
40.0000 mg | DELAYED_RELEASE_TABLET | Freq: Two times a day (BID) | ORAL | 0 refills | Status: DC
  Filled 2022-02-12 – 2022-06-17 (×2): qty 240, 120d supply, fill #0

## 2022-02-12 MED ORDER — NIFEDIPINE ER OSMOTIC RELEASE 30 MG PO TB24
30.0000 mg | ORAL_TABLET | Freq: Two times a day (BID) | ORAL | 0 refills | Status: DC
  Filled 2022-02-12: qty 230, 115d supply, fill #0
  Filled 2022-02-12: qty 10, 5d supply, fill #0
  Filled 2022-02-12: qty 120, 60d supply, fill #0

## 2022-02-12 MED ORDER — LOSARTAN POTASSIUM 100 MG PO TABS
100.0000 mg | ORAL_TABLET | Freq: Every day | ORAL | 0 refills | Status: DC
  Filled 2022-02-12 – 2022-06-17 (×2): qty 120, 120d supply, fill #0

## 2022-02-12 NOTE — Telephone Encounter (Signed)
Oral Oncology Patient Advocate Encounter   Submitted application for assistance for Lenvima to [facility] Patient Assistance.   Application submitted via e-fax to [phone]   Eisai's phone number [phone].   I will continue to check the status until final determination.   Berdine Addison, [facility] Patient [place]  [phone] (phone) [phone] (fax) 02/12/2022 9:07 AM

## 2022-02-13 LAB — AFP TUMOR MARKER: AFP, Serum, Tumor Marker: 6 ng/mL (ref 0.0–9.2)

## 2022-02-14 ENCOUNTER — Encounter: Payer: Self-pay | Admitting: Internal Medicine

## 2022-02-16 ENCOUNTER — Encounter: Payer: Self-pay | Admitting: Hematology & Oncology

## 2022-02-16 ENCOUNTER — Other Ambulatory Visit: Payer: Self-pay

## 2022-02-16 ENCOUNTER — Other Ambulatory Visit (HOSPITAL_BASED_OUTPATIENT_CLINIC_OR_DEPARTMENT_OTHER): Payer: Self-pay

## 2022-02-16 ENCOUNTER — Other Ambulatory Visit (HOSPITAL_COMMUNITY): Payer: Self-pay

## 2022-02-16 MED ORDER — COMIRNATY 30 MCG/0.3ML IM SUSY
PREFILLED_SYRINGE | INTRAMUSCULAR | 0 refills | Status: DC
  Filled 2022-02-16: qty 0.3, 1d supply, fill #0

## 2022-02-16 NOTE — Telephone Encounter (Signed)
Called to check status of application. Informed it was in processing and would hear back in a day or two. I will continue to follow and update.   Berdine Addison, [facility] Patient [place]  [phone] (phone) [phone] (fax) 02/16/2022 4:24 PM'

## 2022-02-17 ENCOUNTER — Other Ambulatory Visit: Payer: Self-pay

## 2022-02-17 MED ORDER — LENVATINIB (8 MG DAILY DOSE) 2 X 4 MG PO CPPK: 8.0000 mg | ORAL_CAPSULE | Freq: Every day | ORAL | 3 refills | Status: DC

## 2022-02-17 NOTE — Telephone Encounter (Signed)
Oral Oncology Patient Advocate Encounter   Received notification that the application for assistance for Lenvima through [place] has been approved.   Eisai's phone number [phone].   Effective dates: 02/16/22 through 02/16/23  I have spoken to the patient.  Berdine Addison, [facility] Patient [place]  [phone] (phone) [phone] (fax) 02/17/2022 8:02 AM

## 2022-02-18 NOTE — Telephone Encounter (Signed)
Oral Chemotherapy Pharmacist Encounter  I spoke with patient's daughter, [name], for overview of: Lenvima (lenvatinib) for the treatment of hepatocellular cancer, planned for the duration while patient is out of the country.   Counseled on administration, dosing, side effects, monitoring, drug-food interactions, safe handling, storage, and disposal.  Patient will take Lenvima '4mg'$  capsules, 2 capsules ('8mg'$ ) by mouth once daily, with or without food, at approximately the same time each day.  Lenvima start date: pending medication acquisition from pharmacy as patient is going to be out of the country next 4 months.  Adverse effects include but are not limited to: hypertension, hand-foot syndrome, diarrhea, joint pain, fatigue, headache, decreased blood counts. Patient will obtain anti diarrheal and alert the office of 4 or more loose stools above baseline. Patient's daughter instructed to notify office of any upcoming invasive procedures of patient as Michel [place] would need to be held for 6 days prior to scheduled surgery, restart based on healing and clinical judgement.   Reviewed importance of keeping a medication schedule and plan for any missed doses. No barriers to medication adherence identified.  Medication reconciliation performed and medication/allergy list updated.  Patient has been approved to receive Lenvima at no cost through [place]. Medication will ship to patient's home in [place] from [employer]. S[facility] is working with patient's daughter to obtain required documentation to see if [facility] will be willing to dispense 4 months worth of Lenvima to patient at once as she is going to be out of the country.   All questions answered.  Patient's daughter voiced understanding and appreciation.   Medication education handout placed in mail. Patient's daughter knows to call the office with questions or concerns. Oral Chemotherapy Clinic phone number provided.  Leron Croak, PharmD, BCPS, [facility] Hematology/Oncology Clinical Pharmacist [facility] and [place] [phone] 02/18/2022 10:16 AM

## 2022-03-08 ENCOUNTER — Other Ambulatory Visit (HOSPITAL_COMMUNITY): Payer: Self-pay

## 2022-03-11 ENCOUNTER — Other Ambulatory Visit: Payer: Self-pay

## 2022-03-18 ENCOUNTER — Ambulatory Visit: Payer: Self-pay | Admitting: Internal Medicine

## 2022-03-26 ENCOUNTER — Other Ambulatory Visit: Payer: Self-pay

## 2022-03-29 ENCOUNTER — Encounter: Payer: Self-pay | Admitting: Hematology & Oncology

## 2022-04-05 ENCOUNTER — Encounter: Payer: Self-pay | Admitting: Internal Medicine

## 2022-04-05 ENCOUNTER — Encounter: Payer: Self-pay | Admitting: Hematology & Oncology

## 2022-05-18 ENCOUNTER — Other Ambulatory Visit: Payer: Self-pay

## 2022-05-31 ENCOUNTER — Other Ambulatory Visit: Payer: Self-pay

## 2022-06-02 ENCOUNTER — Other Ambulatory Visit: Payer: Self-pay

## 2022-06-02 ENCOUNTER — Ambulatory Visit: Payer: Self-pay | Admitting: Internal Medicine

## 2022-06-09 ENCOUNTER — Encounter: Payer: Self-pay | Admitting: Internal Medicine

## 2022-06-09 ENCOUNTER — Encounter: Payer: Self-pay | Admitting: Hematology & Oncology

## 2022-06-17 ENCOUNTER — Other Ambulatory Visit: Payer: Self-pay | Admitting: Internal Medicine

## 2022-06-17 ENCOUNTER — Other Ambulatory Visit: Payer: Self-pay

## 2022-06-17 ENCOUNTER — Encounter: Payer: Self-pay | Admitting: Hematology & Oncology

## 2022-06-17 MED ORDER — NIFEDIPINE ER OSMOTIC RELEASE 30 MG PO TB24
30.0000 mg | ORAL_TABLET | Freq: Two times a day (BID) | ORAL | 0 refills | Status: DC
  Filled 2022-06-17: qty 60, 30d supply, fill #0

## 2022-06-18 ENCOUNTER — Encounter: Payer: Self-pay | Admitting: Hematology & Oncology

## 2022-06-18 ENCOUNTER — Inpatient Hospital Stay: Payer: No Typology Code available for payment source

## 2022-06-18 ENCOUNTER — Inpatient Hospital Stay: Payer: No Typology Code available for payment source | Attending: Hematology & Oncology

## 2022-06-18 ENCOUNTER — Inpatient Hospital Stay (HOSPITAL_BASED_OUTPATIENT_CLINIC_OR_DEPARTMENT_OTHER): Payer: Self-pay | Admitting: Hematology & Oncology

## 2022-06-18 ENCOUNTER — Other Ambulatory Visit: Payer: Self-pay

## 2022-06-18 VITALS — BP 129/94 | HR 82 | Temp 98.3°F | Resp 17 | Wt 159.0 lb

## 2022-06-18 DIAGNOSIS — C220 Liver cell carcinoma: Secondary | ICD-10-CM

## 2022-06-18 DIAGNOSIS — B182 Chronic viral hepatitis C: Secondary | ICD-10-CM

## 2022-06-18 DIAGNOSIS — R188 Other ascites: Secondary | ICD-10-CM

## 2022-06-18 DIAGNOSIS — B1920 Unspecified viral hepatitis C without hepatic coma: Secondary | ICD-10-CM | POA: Insufficient documentation

## 2022-06-18 DIAGNOSIS — R21 Rash and other nonspecific skin eruption: Secondary | ICD-10-CM | POA: Insufficient documentation

## 2022-06-18 DIAGNOSIS — Z5112 Encounter for antineoplastic immunotherapy: Secondary | ICD-10-CM | POA: Insufficient documentation

## 2022-06-18 LAB — CBC WITH DIFFERENTIAL (CANCER CENTER ONLY)
Abs Immature Granulocytes: 0.03 10*3/uL (ref 0.00–0.07)
Basophils Absolute: 0 10*3/uL (ref 0.0–0.1)
Basophils Relative: 0 %
Eosinophils Absolute: 0 10*3/uL (ref 0.0–0.5)
Eosinophils Relative: 1 %
HCT: 42 % (ref 36.0–46.0)
Hemoglobin: 14.2 g/dL (ref 12.0–15.0)
Immature Granulocytes: 1 %
Lymphocytes Relative: 31 %
Lymphs Abs: 1.8 10*3/uL (ref 0.7–4.0)
MCH: 29.7 pg (ref 26.0–34.0)
MCHC: 33.8 g/dL (ref 30.0–36.0)
MCV: 87.9 fL (ref 80.0–100.0)
Monocytes Absolute: 0.3 10*3/uL (ref 0.1–1.0)
Monocytes Relative: 5 %
Neutro Abs: 3.5 10*3/uL (ref 1.7–7.7)
Neutrophils Relative %: 62 %
Platelet Count: 177 10*3/uL (ref 150–400)
RBC: 4.78 MIL/uL (ref 3.87–5.11)
RDW: 16.9 % — ABNORMAL HIGH (ref 11.5–15.5)
WBC Count: 5.7 10*3/uL (ref 4.0–10.5)
nRBC: 0 % (ref 0.0–0.2)

## 2022-06-18 LAB — CMP (CANCER CENTER ONLY)
ALT: 29 U/L (ref 0–44)
AST: 22 U/L (ref 15–41)
Albumin: 3.6 g/dL (ref 3.5–5.0)
Alkaline Phosphatase: 82 U/L (ref 38–126)
Anion gap: 6 (ref 5–15)
BUN: 23 mg/dL (ref 8–23)
CO2: 27 mmol/L (ref 22–32)
Calcium: 10 mg/dL (ref 8.9–10.3)
Chloride: 106 mmol/L (ref 98–111)
Creatinine: 0.7 mg/dL (ref 0.44–1.00)
GFR, Estimated: >60 mL/min (ref >60)
Glucose, Bld: 167 mg/dL — ABNORMAL HIGH (ref 70–99)
Potassium: 3.8 mmol/L (ref 3.5–5.1)
Sodium: 139 mmol/L (ref 135–145)
Total Bilirubin: 0.5 mg/dL (ref 0.3–1.2)
Total Protein: 7 g/dL (ref 6.5–8.1)

## 2022-06-18 LAB — LACTATE DEHYDROGENASE: LDH: 215 U/L — ABNORMAL HIGH (ref 98–192)

## 2022-06-18 MED ORDER — SODIUM CHLORIDE 0.9 % IV SOLN
480.0000 mg | Freq: Once | INTRAVENOUS | Status: AC
  Administered 2022-06-18: 480 mg via INTRAVENOUS
  Filled 2022-06-18: qty 48

## 2022-06-18 MED ORDER — SODIUM CHLORIDE 0.9% FLUSH
10.0000 mL | INTRAVENOUS | Status: DC | PRN
  Administered 2022-06-18: 10 mL

## 2022-06-18 MED ORDER — LIDOCAINE-PRILOCAINE 2.5-2.5 % EX CREA
1.0000 | TOPICAL_CREAM | CUTANEOUS | 2 refills | Status: AC | PRN
  Filled 2022-06-18: qty 30, 15d supply, fill #0

## 2022-06-18 MED ORDER — SODIUM CHLORIDE 0.9 % IV SOLN: Freq: Once | INTRAVENOUS | Status: AC

## 2022-06-18 MED ORDER — HEPARIN SOD (PORK) LOCK FLUSH 100 UNIT/ML IV SOLN
500.0000 [IU] | Freq: Once | INTRAVENOUS | Status: AC | PRN
  Administered 2022-06-18: 500 [IU]

## 2022-06-18 NOTE — Patient Instructions (Signed)

## 2022-06-18 NOTE — Progress Notes (Signed)
Hematology and Oncology Follow Up Visit  [name] [name] [id number] Feb 11, 1946 77 y.o. 06/18/2022   Principle Diagnosis:  Hepatocellular carcinoma-multifocal Hepatitis C   Current Therapy:        Status post cycle 1 of nivolumab/ipilimumab -- d/c on 10/13/2020 due to hepatic toxicity Nivolumab 480 mg IV every 4 weeks - s/p cycle #19 -- started 11/21/2020 --changed to 480 mg on 08/2021 Nexavar 200 mg po BID -- to be taken while in [place] -- start on 02/26/2022 -- d/c on 06/16/2022   Interim History:  [name] is here today for follow-up.  She has returned from [place].  She had a wonderful time in [place] over the Winter.  She just got back the about a week ago.  She was on Nexavar while she was over there for her hepatocellular carcinoma.  While over there, she unfortunately need to have cataract surgery.  This is for her right eye.  I think this was back in February.  She also had an episode of malaria.  She was placed on an antimalarial.  She is not sure what it was.  I think she still might be taking this.  Thankfully, she did not have any problems with COVID while over in [place].  She had no problems with pain.  There is no bleeding.  She had no problems with cough or shortness of breath.  She had no leg swelling.  She did have a rash which she had when she was in the [place].  This seemed to improve a little bit.  She is incredibly kind and generous and brought me back some authentic [place] clothing.  I am very humbled by her generosity.  When she left, her alpha-fetoprotein was 6.  I think the real key for her success has been the fact that her Hepatitis C has been treated.  Currently, I would say that her performance status is probably ECOG 1.  Medications:  Allergies as of 06/18/2022       Reactions   Bactrim [sulfamethoxazole-trimethoprim] Swelling   Clarithromycin Swelling, Rash   Daughter is unsure if Clarithromycin or  Amoxicillin caused rash and swelling. Patient states no allergy to amoxicillan - has taken this recently with no issues. (12/02/21)        Medication List        Accurate as of Jun 18, 2022  1:05 PM. If you have any questions, ask your nurse or doctor.          Basaglar KwikPen 100 UNIT/ML Inject 12 Units into the skin daily.   brimonidine 0.2 % ophthalmic solution Commonly known as: ALPHAGAN 1 drop 2 (two) times daily.   clobetasol ointment 0.05 % Commonly known as: TEMOVATE Apply twice a day on affected areas   Comirnaty syringe Generic drug: COVID-19 mRNA vaccine 2023-2024 Inject into the muscle.   gabapentin 300 MG capsule Commonly known as: NEURONTIN Take 1 capsule (300 mg total) by mouth once nightly at bedtime.   HumaLOG KwikPen 200 UNIT/ML KwikPen Generic drug: insulin lispro Max daily dose 100 units.   lenvatinib 8 mg daily dose 2 x 4 MG capsule Commonly known as: LENVIMA Take 2 capsules (8 mg total) by mouth daily.   lidocaine-prilocaine cream Commonly known as: EMLA Apply 1 Application topically as needed. Started by: Rozell Searing, RN   losartan 100 MG tablet Commonly known as: COZAAR Take 1 tablet (100 mg total) by mouth at bedtime.   Magnesium 400 MG Tabs Take 1 tablet  by mouth daily at 6 (six) AM.   NIFEdipine 30 MG 24 hr tablet Commonly known as: PROCARDIA-XL/NIFEDICAL-XL Take 1 tablet (30 mg total) by mouth in the morning and at bedtime.   pantoprazole 40 MG tablet Commonly known as: Protonix Take 1 tablet (40 mg total) by mouth 2 (two) times daily.   predniSONE 20 MG tablet Commonly known as: DELTASONE Take 1 tablet (20 mg total) by mouth daily with breakfast.   predniSONE 5 MG tablet Commonly known as: DELTASONE Take 3.5 tablets (17.5 mg total) by mouth daily for 30 days, THEN 3 tablets (15 mg total) daily for 30 days, THEN 2.5 tablets (12.5 mg total) daily for 30 days, THEN 2.5 tablets (12.5 mg total) daily for 30 days,  THEN 2 tablets (10 mg total) daily for 30 days.   SSD 1 % cream Generic drug: silver sulfADIAZINE Apply to the open, ulcerated areas of the skin once daily as needed.   SSD 1 % cream Generic drug: silver sulfADIAZINE Apply to the open, ulcerated areas of the skin once daily as needed.   TechLite Pen Needles 32G X 4 MM Misc Generic drug: Insulin Pen Needle Use with insulins daily.   triamcinolone ointment 0.1 % Commonly known as: KENALOG Apply 1 Application topically 2 (two) times daily.   True Metrix Blood Glucose Test test strip Generic drug: glucose blood Use as instructed   Vitamin D (Cholecalciferol) 10 MCG (400 UNIT) Tabs Take 1 tablet by mouth daily at 6 (six) AM.        Allergies:  Allergies  Allergen Reactions   Bactrim [Sulfamethoxazole-Trimethoprim] Swelling   Clarithromycin Swelling and Rash    Daughter is unsure if Clarithromycin or Amoxicillin caused rash and swelling.  Patient states no allergy to amoxicillan - has taken this recently with no issues. (12/02/21)    Past Medical History, Surgical history, Social history, and Family History were reviewed and updated.  Review of Systems: Review of Systems  Constitutional: Negative.   HENT: Negative.    Eyes: Negative.   Respiratory: Negative.    Cardiovascular: Negative.   Gastrointestinal: Negative.   Genitourinary: Negative.   Musculoskeletal: Negative.   Skin: Negative.   Neurological:  Positive for tingling.  Endo/Heme/Allergies: Negative.   Psychiatric/Behavioral: Negative.       Physical Exam:  weight is 159 lb (72.1 kg). Her oral temperature is 98.3 F (36.8 C). Her blood pressure is 129/94 (abnormal) and her pulse is 82. Her respiration is 17 and oxygen saturation is 100%.   Wt Readings from Last 3 Encounters:  06/18/22 159 lb (72.1 kg)  02/11/22 162 lb (73.5 kg)  01/28/22 167 lb (75.8 kg)    Physical Exam Vitals reviewed.  HENT:     Head: Normocephalic and atraumatic.  Eyes:      Pupils: Pupils are equal, round, and reactive to light.  Cardiovascular:     Rate and Rhythm: Normal rate and regular rhythm.     Heart sounds: Normal heart sounds.  Pulmonary:     Effort: Pulmonary effort is normal.     Breath sounds: Normal breath sounds.  Abdominal:     General: Bowel sounds are normal.     Palpations: Abdomen is soft.     Comments: Abdominal exam shows a well-healed laparotomy scar.  She has no fluid wave.  There is no guarding or rebound tenderness to palpation.  There is no palpable liver or spleen tip.  Musculoskeletal:        General: No tenderness  or deformity. Normal range of motion.     Cervical back: Normal range of motion.  Lymphadenopathy:     Cervical: No cervical adenopathy.  Skin:    General: Skin is warm and dry.     Findings: No erythema or rash.     Comments: Skin exam does show a extensive but somewhat healing papular rash on her lower legs.  This is below the knees.  She has some dressing on the foot on the right foot.  She has no open wounds.  There is no blisters.  Neurological:     Mental Status: She is alert and oriented to person, place, and time.  Psychiatric:        Behavior: Behavior normal.        Thought Content: Thought content normal.        Judgment: Judgment normal.       Lab Results  Component Value Date   WBC 5.7 06/18/2022   HGB 14.2 06/18/2022   HCT 42.0 06/18/2022   MCV 87.9 06/18/2022   PLT 177 06/18/2022   Lab Results  Component Value Date   FERRITIN 603 (H) 10/02/2020   IRON 31 (L) 10/02/2020   TIBC 241 10/02/2020   UIBC 211 10/02/2020   IRONPCTSAT 13 (L) 10/02/2020   Lab Results  Component Value Date   RBC 4.78 06/18/2022   No results found for: "KPAFRELGTCHN", "LAMBDASER", "KAPLAMBRATIO" No results found for: "IGGSERUM", "IGA", "IGMSERUM" No results found for: "TOTALPROTELP", "ALBUMINELP", "A1GS", "A2GS", "BETS", "BETA2SER", "GAMS", "MSPIKE", "SPEI"   Chemistry      Component Value Date/Time    NA 139 06/18/2022 0840   NA 140 01/22/2022 0849   K 3.8 06/18/2022 0840   CL 106 06/18/2022 0840   CO2 27 06/18/2022 0840   BUN 23 06/18/2022 0840   BUN 11 01/22/2022 0849   CREATININE 0.70 06/18/2022 0840   CREATININE 0.58 (L) 08/20/2021 1541      Component Value Date/Time   CALCIUM 10.0 06/18/2022 0840   ALKPHOS 82 06/18/2022 0840   AST 22 06/18/2022 0840   ALT 29 06/18/2022 0840   BILITOT 0.5 06/18/2022 0840       Impression and Plan: [name] is a very pleasant 77 yo female from [place] with hepatocellular carcinoma and Hepatitis C.   She is now back from [place].  She was on Nexavar over there.  I had to believe that the Nexavar certainly helped.  Now that she is back in the [place], we will get her back on nivolumab.  This really did help her quite a bit.  I think that we probably do need to get an MRI of her abdomen.  I would like to see how her liver cancer looks.  I told she and her daughter that if everything looks good, we can slowly move her treatment intervals out longer and longer.  Another option for [place] might be to do some intrahepatic therapy on her.  I am just glad that she had a wonderful time over in [place] with her family.  She still looks fantastic.  We will plan to get her back to see [place] in another month.    [name], MD 5/3/20241:05 PM

## 2022-06-18 NOTE — Patient Instructions (Signed)
[facility]  Discharge Instructions: Thank you for choosing [facility] to provide your oncology and hematology care.   If you have a lab appointment with the Cancer Center, please go directly to the Cancer Center and check in at the registration area.  Wear comfortable clothing and clothing appropriate for easy access to any Portacath or PICC line.   We strive to give you quality time with your provider. You may need to reschedule your appointment if you arrive late (15 or more minutes).  Arriving late affects you and other patients whose appointments are after yours.  Also, if you miss three or more appointments without notifying the office, you may be dismissed from the clinic at the provider's discretion.      For prescription refill requests, have your pharmacy contact our office and allow 72 hours for refills to be completed.    Today you received the following chemotherapy and/or immunotherapy agents Opdivo.      To help prevent nausea and vomiting after your treatment, we encourage you to take your nausea medication as directed.  BELOW ARE SYMPTOMS THAT SHOULD BE REPORTED IMMEDIATELY: *FEVER GREATER THAN 100.4 F (38 C) OR HIGHER *CHILLS OR SWEATING *NAUSEA AND VOMITING THAT IS NOT CONTROLLED WITH YOUR NAUSEA MEDICATION *UNUSUAL SHORTNESS OF BREATH *UNUSUAL BRUISING OR BLEEDING *URINARY PROBLEMS (pain or burning when urinating, or frequent urination) *BOWEL PROBLEMS (unusual diarrhea, constipation, pain near the anus) TENDERNESS IN MOUTH AND THROAT WITH OR WITHOUT PRESENCE OF ULCERS (sore throat, sores in mouth, or a toothache) UNUSUAL RASH, SWELLING OR PAIN  UNUSUAL VAGINAL DISCHARGE OR ITCHING   Items with * indicate a potential emergency and should be followed up as soon as possible or go to the Emergency Department if any problems should occur.  Please show the CHEMOTHERAPY ALERT CARD or IMMUNOTHERAPY ALERT CARD at check-in  to the Emergency Department and triage nurse. Should you have questions after your visit or need to cancel or reschedule your appointment, please contact [facility]  [phone] and follow the prompts.  Office hours are 8:00 a.m. to 4:30 p.m. Monday - Friday. Please note that voicemails left after 4:00 p.m. may not be returned until the following business day.  We are closed weekends and major holidays. You have access to a nurse at all times for urgent questions. Please call the main number to the clinic [phone] and follow the prompts.  For any non-urgent questions, you may also contact your provider using MyChart. We now offer e-Visits for anyone 18 and older to request care online for non-urgent symptoms. For details visit [email or web address].   Also download the MyChart app! Go to the app store, search "MyChart", open the app, select [facility], and log in with your MyChart username and password.   

## 2022-06-19 LAB — AFP TUMOR MARKER: AFP, Serum, Tumor Marker: 8.3 ng/mL (ref 0.0–9.2)

## 2022-06-21 ENCOUNTER — Other Ambulatory Visit: Payer: Self-pay

## 2022-06-22 ENCOUNTER — Other Ambulatory Visit: Payer: Self-pay

## 2022-06-22 DIAGNOSIS — B182 Chronic viral hepatitis C: Secondary | ICD-10-CM

## 2022-06-23 ENCOUNTER — Encounter: Payer: Self-pay | Admitting: Hematology & Oncology

## 2022-06-23 ENCOUNTER — Ambulatory Visit (INDEPENDENT_AMBULATORY_CARE_PROVIDER_SITE_OTHER): Payer: No Typology Code available for payment source | Admitting: Internal Medicine

## 2022-06-23 ENCOUNTER — Ambulatory Visit: Payer: Self-pay | Admitting: Internal Medicine

## 2022-06-23 ENCOUNTER — Encounter: Payer: Self-pay | Admitting: Internal Medicine

## 2022-06-23 VITALS — BP 130/82 | HR 88 | Ht 64.0 in | Wt 159.0 lb

## 2022-06-23 DIAGNOSIS — Z794 Long term (current) use of insulin: Secondary | ICD-10-CM

## 2022-06-23 DIAGNOSIS — E1165 Type 2 diabetes mellitus with hyperglycemia: Secondary | ICD-10-CM

## 2022-06-23 DIAGNOSIS — E119 Type 2 diabetes mellitus without complications: Secondary | ICD-10-CM

## 2022-06-23 LAB — POCT GLYCOSYLATED HEMOGLOBIN (HGB A1C): Hemoglobin A1C: 6.6 % — AB (ref 4.0–5.6)

## 2022-06-23 NOTE — Progress Notes (Unsigned)
Name: [name] [name]  MRN/ DOB: [id number], Mar 04, 1945   Age/ Sex: 77 y.o., female    PCP: Marcine Matar, MD   Reason for Endocrinology Evaluation: Type 2 Diabetes Mellitus     Date of Initial Endocrinology Visit: 01/18/2022    PATIENT IDENTIFIER: [name] [name] is a 77 y.o. female with a past medical history of Hx of perforated gastric ulcer, chronic hepatitis C, HTN, hepatocellular carcinoma, T2DM. The patient presented for initial endocrinology clinic visit on 01/18/2022 for consultative assistance with her diabetes management.    HPI: [name] [name] is here with her daughter    Diagnosed with DM 2005 Prior Medications tried/Intolerance: was on oral agents years ago , bit does not recall  Hemoglobin A1c has ranged from 7.3% in 06/2021, peaking at 7.5% in 2023.  Of note, the pt follows with oncology ( Dr. Myna Hidalgo) for hepatocellular carcinoma, currently on Nivolumab infusions   She is on Prednisone  through dermatology for lichenoid dermatitis   ON her initial visit to our clinic her A1c 6.7% but she was on Humalog Mix Plus regular humalog and was guesstimating her  doses. We switched  to basal/prandial insulin   SUBJECTIVE:   During the last visit (01/18/2022): A1c 6.7%     Today (06/23/22): [name] is here for a follow up on diabetes management. She is accompanied by her daughter. She checks her blood sugars 4 times daily. The patient has  had hypoglycemic episodes since the last clinic visit, which typically occur  in the evening    Patient follows with oncology for hepatocellular carcinoma, hepatitis C.  On chemotherapy She continues to be on prednisone for lichenoid dermatitis, which is on a taper currently at 7.5 mg, will decrease to 5 mg by 5/20  Denies nausea or vomiting  NO changes in bowel movement.     HOME DIABETES REGIMEN: Lantus 12 units daily Humalog 16-/16/12 Correction factor: Humalog (BG  -130/25)  Prednisone 7.5 mg      Statin: No ACE-I/ARB: Yes    METER DOWNLOAD SUMMARY:  90 day average 157 mg/dl    This am 409  5/7 @ 5 pm 66         @ 12:30 188    DIABETIC COMPLICATIONS: Microvascular complications:   Denies: CKD Last eye exam: Completed   Macrovascular complications:   Denies: CAD, PVD, CVA   PAST HISTORY: Past Medical History:  Past Medical History:  Diagnosis Date   Colon cancer (HCC)    Diabetes mellitus without complication (HCC)    Glaucoma    Goals of care, counseling/discussion 09/01/2020   Hepatitis C    Hepatocellular carcinoma (HCC)    Hypertension    Past Surgical History:  Past Surgical History:  Procedure Laterality Date   COLON RESECTION  2005   In [place] - ?colon cancer   IR IMAGING GUIDED PORT INSERTION  09/18/2020   LAPAROSCOPIC GASTRIC RESECTION N/A 08/09/2020   Procedure: LAPAROSCOPIC EXPLORATION OF ABDOMEN, PERFORATED ULCER REPAIR, LIVER BIOPSY;  Surgeon: Karie Soda, MD;  Location: [facility];  Service: General;  Laterality: N/A;    Social History:  reports that she has never smoked. She has never used smokeless tobacco. She reports that she does not currently use alcohol. She reports that she does not use drugs. Family History:  Family History  Problem Relation Age of Onset   Cancer Father        type unknown, was on the leg   Other Daughter  Acoustic neuroma   Breast cancer Neg Hx    Colon cancer Neg Hx    Esophageal cancer Neg Hx    Rectal cancer Neg Hx    Stomach cancer Neg Hx      HOME MEDICATIONS: Allergies as of 06/23/2022       Reactions   Bactrim [sulfamethoxazole-trimethoprim] Swelling   Clarithromycin Swelling, Rash   Daughter is unsure if Clarithromycin or Amoxicillin caused rash and swelling. Patient states no allergy to amoxicillan - has taken this recently with no issues. (12/02/21)        Medication List        Accurate as of Jun 23, 2022 12:15 PM. If you have any questions,  ask your nurse or doctor.          Basaglar KwikPen 100 UNIT/ML Inject 12 Units into the skin daily.   brimonidine 0.2 % ophthalmic solution Commonly known as: ALPHAGAN 1 drop 2 (two) times daily.   clobetasol ointment 0.05 % Commonly known as: TEMOVATE Apply twice a day on affected areas   Comirnaty syringe Generic drug: COVID-19 mRNA vaccine 2023-2024 Inject into the muscle.   gabapentin 300 MG capsule Commonly known as: NEURONTIN Take 1 capsule (300 mg total) by mouth once nightly at bedtime.   HumaLOG KwikPen 200 UNIT/ML KwikPen Generic drug: insulin lispro Max daily dose 100 units.   lenvatinib 8 mg daily dose 2 x 4 MG capsule Commonly known as: LENVIMA Take 2 capsules (8 mg total) by mouth daily.   lidocaine-prilocaine cream Commonly known as: EMLA Apply 1 Application topically as needed.   losartan 100 MG tablet Commonly known as: COZAAR Take 1 tablet (100 mg total) by mouth at bedtime.   Magnesium 400 MG Tabs Take 1 tablet by mouth daily at 6 (six) AM.   NIFEdipine 30 MG 24 hr tablet Commonly known as: PROCARDIA-XL/NIFEDICAL-XL Take 1 tablet (30 mg total) by mouth in the morning and at bedtime.   pantoprazole 40 MG tablet Commonly known as: Protonix Take 1 tablet (40 mg total) by mouth 2 (two) times daily.   predniSONE 20 MG tablet Commonly known as: DELTASONE Take 1 tablet (20 mg total) by mouth daily with breakfast.   predniSONE 5 MG tablet Commonly known as: DELTASONE Take 3.5 tablets (17.5 mg total) by mouth daily for 30 days, THEN 3 tablets (15 mg total) daily for 30 days, THEN 2.5 tablets (12.5 mg total) daily for 30 days, THEN 2.5 tablets (12.5 mg total) daily for 30 days, THEN 2 tablets (10 mg total) daily for 30 days.   SSD 1 % cream Generic drug: silver sulfADIAZINE Apply to the open, ulcerated areas of the skin once daily as needed.   SSD 1 % cream Generic drug: silver sulfADIAZINE Apply to the open, ulcerated areas of the skin once  daily as needed.   TechLite Pen Needles 32G X 4 MM Misc Generic drug: Insulin Pen Needle Use with insulins daily.   triamcinolone ointment 0.1 % Commonly known as: KENALOG Apply 1 Application topically 2 (two) times daily.   True Metrix Blood Glucose Test test strip Generic drug: glucose blood Use as instructed   Vitamin D (Cholecalciferol) 10 MCG (400 UNIT) Tabs Take 1 tablet by mouth daily at 6 (six) AM.         ALLERGIES: Allergies  Allergen Reactions   Bactrim [Sulfamethoxazole-Trimethoprim] Swelling   Clarithromycin Swelling and Rash    Daughter is unsure if Clarithromycin or Amoxicillin caused rash and swelling.  Patient states  no allergy to amoxicillan - has taken this recently with no issues. (12/02/21)     REVIEW OF SYSTEMS: A comprehensive ROS was conducted with the patient and is negative except as per HPI     OBJECTIVE:   VITAL SIGNS: BP 130/82 (BP Location: Left Arm, Patient Position: Sitting, Cuff Size: Normal)   Pulse 88   Ht 5\' 4"  (1.626 m)   Wt 159 lb (72.1 kg)   SpO2 97%   BMI 27.29 kg/m    PHYSICAL EXAM:  General: Pt appears well and is in NAD  Neck: General: Supple without adenopathy or carotid bruits. Thyroid: Thyroid size normal.  No goiter or nodules appreciated.   Lungs: Clear with good BS bilat with no rales, rhonchi, or wheezes  Heart: RRR   Extremities:  Lower extremities - No pretibial edema  Neuro: MS is good with appropriate affect, pt is alert and Ox3   DM Foot Exam 06/23/2022  The skin of the feet is intact without sores or ulcerations. The pedal pulses are 2+ on right and 2+ on left. The sensation is intact to a screening 5.07, 10 gram monofilament bilaterally    DATA REVIEWED:  Lab Results  Component Value Date   HGBA1C 6.7 (A) 01/18/2022   HGBA1C 7.5 (A) 10/30/2021   HGBA1C 7.3 (A) 06/19/2021    Latest Reference Range & Units 01/22/22 08:49  Sodium 134 - 144 mmol/L 140  Potassium 3.5 - 5.2 mmol/L 4.3   Chloride 96 - 106 mmol/L 104  CO2 20 - 29 mmol/L 20  Glucose 70 - 99 mg/dL 409 (H)  BUN 8 - 27 mg/dL 11  Creatinine 8.11 - 9.14 mg/dL 7.82  Calcium 8.7 - 95.6 mg/dL 21.3  BUN/Creatinine Ratio 12 - 28  15  eGFR >59 mL/min/1.73 87     ASSESSMENT / PLAN / RECOMMENDATIONS:   1) Type 2 Diabetes Mellitus, Optimally controlled, Without complications - Most recent A1c of 6.7 %. Goal A1c < 7.0 %.     - A1c remains optimal  - She did have an episode of hypoglycemia of 66 mg/dL yesterday afternoon but this is because she took prandial dose of insulin withOUT eating lunch, we discussed importance of taking prandial dose of insulin only if she eats a meal , but she may continue to use correction scale with /without eating a meal  -Patient has no health insurance and I am not aware of any patient assistance programs for CGM technology - Since she is on a prednisone taper , and I have asked her to reduce prandial dose of insulin by 4 units with each taper   MEDICATIONS: -Continue Lantus 12 units daily -Change Humalog 14 units with breakfast, 14 units with lunch, and 12 units with supper -Continue Correction factor: Humalog (BG -130/25)  EDUCATION / INSTRUCTIONS: BG monitoring instructions: Patient is instructed to check her blood sugars 3 times a day, before each meal . Call [facility] if: BG persistently < 70  I reviewed the Rule of 15 for the treatment of hypoglycemia in detail with the patient. Literature supplied.   2) Diabetic complications:  Eye: Does not have known diabetic retinopathy.  Neuro/ Feet: Does not have known diabetic peripheral neuropathy. Renal: Patient does not have known baseline CKD. She is  on an ACEI/ARB at present.    Follow-up in 6 months   Signed electronically by: Lyndle Herrlich, MD  [facility]  [facility] [address] [place], [place]  [address] Phone: [phone] FAX: [phone]    CC: Marcine Matar, MD [address] [place] [address] Phone: [phone]  Fax: [phone]    Return to Endocrinology clinic as below: Future Appointments  Date Time Provider Department Center  06/25/2022  3:10 PM Marcine Matar, MD [facility] None  06/29/2022  9:45 AM Vu, [name], MD [facility]

## 2022-06-23 NOTE — Patient Instructions (Addendum)
  Continue Lantus 12 units ONCE daily  Take Humalog 14 units with Breakfast, 14 units with Lunch and 12 units with Supper Humalog correctional insulin: ADD extra units on insulin to your meal-time Humalog  dose if your blood sugars are higher than 155. Use the scale below to help guide you:   Blood sugar before meal Number of units to inject  Less than 155 0 unit  156 -  180 1 units  181 -  205 2 units  206 -  230 3 units  231 -  255 4 units  256 -  280 5 units  281 -  305 6 units  306 -  330 7 units  331 -  355 8 units  356 - 380 9 units     WHEN PREDNISONE IS DECREASED:  Each time the Prednisone is decreased, please decrease Humalog by 4 units for breakfast, Lunch and Supper   NO change to lantus or the scale    HOW TO TREAT LOW BLOOD SUGARS (Blood sugar LESS THAN 70 MG/DL) Please follow the RULE OF 15 for the treatment of hypoglycemia treatment (when your (blood sugars are less than 70 mg/dL)   STEP 1: Take 15 grams of carbohydrates when your blood sugar is low, which includes:  3-4 GLUCOSE TABS  OR 3-4 OZ OF JUICE OR REGULAR SODA OR ONE TUBE OF GLUCOSE GEL    STEP 2: RECHECK blood sugar in 15 MINUTES STEP 3: If your blood sugar is still low at the 15 minute recheck --> then, go back to STEP 1 and treat AGAIN with another 15 grams of carbohydrates.

## 2022-06-24 ENCOUNTER — Other Ambulatory Visit: Payer: Self-pay

## 2022-06-24 DIAGNOSIS — E119 Type 2 diabetes mellitus without complications: Secondary | ICD-10-CM | POA: Insufficient documentation

## 2022-06-25 ENCOUNTER — Ambulatory Visit: Payer: Self-pay | Attending: Internal Medicine | Admitting: Internal Medicine

## 2022-06-25 ENCOUNTER — Encounter: Payer: Self-pay | Admitting: Internal Medicine

## 2022-06-25 VITALS — BP 127/74 | HR 80 | Temp 98.4°F | Ht 64.0 in | Wt 159.0 lb

## 2022-06-25 DIAGNOSIS — Z9841 Cataract extraction status, right eye: Secondary | ICD-10-CM

## 2022-06-25 DIAGNOSIS — R058 Other specified cough: Secondary | ICD-10-CM

## 2022-06-25 DIAGNOSIS — C220 Liver cell carcinoma: Secondary | ICD-10-CM

## 2022-06-25 DIAGNOSIS — E1159 Type 2 diabetes mellitus with other circulatory complications: Secondary | ICD-10-CM

## 2022-06-25 DIAGNOSIS — I152 Hypertension secondary to endocrine disorders: Secondary | ICD-10-CM

## 2022-06-25 DIAGNOSIS — L280 Lichen simplex chronicus: Secondary | ICD-10-CM

## 2022-06-25 DIAGNOSIS — Z794 Long term (current) use of insulin: Secondary | ICD-10-CM

## 2022-06-25 DIAGNOSIS — E1142 Type 2 diabetes mellitus with diabetic polyneuropathy: Secondary | ICD-10-CM

## 2022-06-25 LAB — HEPATITIS C RNA QUANTITATIVE
HCV Quantitative Log: <1.18 log IU/mL
HCV RNA, PCR, QN: <15 IU/mL

## 2022-06-25 NOTE — Progress Notes (Signed)
Patient ID: [name] [name], female    DOB: [birth year]  MRN: [id number]  CC: Diabetes (DM f/u. /Dry cough X2 weeks - treated in [place] with amoxicillin Valentino Hue to Tdap vax. - unsure if received in the last 10 yrs)   Subjective: [name] [name] is a 77 y.o. 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    Recently returned from her trip to the [place] where she has been for the past 4 months. Had cataract extraction while there on [facility] eye.  Recovery has not went well.  Can see images but not well enough to read even with glasses.  Wearing same glasses as prior to surgery  DM: Lab Results  Component Value Date   HGBA1C 6.6 (A) 06/23/2022   Saw her endocrinologist Dr. Brooks Sailors 2 days ago. Left on Lantus insulin 12 units daily and Humalog 14/14/12. Doing okay with her eating habits.  HTN: Remains on Cozaar 100 mg daily, Procardia XL 30 mg twice a day.  Checking BP at home daily.  She has a log with her.  Pressures have been a little bit elevated.  Recent readings: 132/79, 158/85, 138/76.  Blood pressures were running higher while she was in her country.  Her daughter had reached out to me and I had recommended changing the Procardia from 30 mg twice a day to 90 mg daily.  She did that while they are but since she has returned to the [place], she has change to her usual dose of Procardia XL 30 mg twice a day.  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  Left on Prednisone 20 mg and has been tapered by 2.5 mg every month.  Currently to 7.5 mg daily  Hepatocellular CA: She has seen Dr. Myna Hidalgo last week since she returned.  Was on Nexavar while in [place]. Now that she is back he  placed her back on Nivolumab.   Had a dry cough that started before she travel.  Took some Amoxil.  Subsided.   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) 09/22/2020   Goals of care, counseling/discussion 09/01/2020   Hepatocellular carcinoma (HCC) 08/14/2020   Hyperglycemia 08/10/2020   Pneumoperitoneum 08/09/2020   History of colorectal cancer 08/09/2020   Liver mass, left lobe 08/09/2020   Insulin-requiring or dependent type II diabetes mellitus (HCC) 08/09/2020   Hypertension associated with diabetes (HCC) 08/09/2020   Constipation, chronic 08/09/2020   Perforated gastric ulcer s/p lap omental Graham patch 08/09/2020 08/09/2020     Current Outpatient Medications on File Prior to Visit  Medication Sig Dispense Refill   brimonidine (ALPHAGAN) 0.2 % ophthalmic solution 1 drop 2 (two) times daily.     clobetasol ointment (TEMOVATE) 0.05 % Apply twice a day on affected areas 60 g 5   COVID-19 mRNA vaccine 2023-2024 (COMIRNATY) syringe Inject into the muscle. 0.3 mL 0   glucose blood (TRUE METRIX BLOOD GLUCOSE TEST) test strip Use as instructed 100  each 12   insulin glargine (LANTUS SOLOSTAR) 100 UNIT/ML Solostar Pen Inject 12 Units into the skin daily. 15 mL 6   insulin lispro (HUMALOG KWIKPEN) 200 UNIT/ML KwikPen Max daily dose 100 units. 60 mL 0   Insulin Pen Needle 32G X 4 MM MISC Use with insulins daily. 400 each 3   lidocaine-prilocaine (EMLA) cream Apply 1 Application topically as needed. 30 g 2   losartan (COZAAR) 100 MG tablet Take 1 tablet (100 mg total) by mouth at bedtime. 120 tablet 0   Magnesium 400 MG TABS Take 1 tablet by mouth daily at 6 (six) AM.      NIFEdipine (PROCARDIA-XL/NIFEDICAL-XL) 30 MG 24 hr tablet Take 1 tablet (30 mg total) by mouth in the morning and at bedtime. 60 tablet 0   pantoprazole (PROTONIX) 40 MG tablet Take 1 tablet (40 mg total) by mouth 2 (two) times daily. 240 tablet 0   predniSONE (DELTASONE) 20 MG tablet Take 1 tablet (20 mg total) by mouth daily with breakfast. 30 tablet 0   predniSONE (DELTASONE) 5 MG tablet Take 3.5 tablets (17.5 mg total) by mouth daily for 30 days, THEN 3 tablets (15 mg total) daily for 30 days, THEN 2.5 tablets (12.5 mg total) daily for 30 days, THEN 2.5 tablets (12.5 mg total) daily for 30 days, THEN 2 tablets (10 mg total) daily for 30 days. 405 tablet 0   silver sulfADIAZINE (SILVADENE) 1 % cream Apply to the open, ulcerated areas of the skin once daily as needed. 400 g 1   silver sulfADIAZINE (SILVADENE) 1 % cream Apply to the open, ulcerated areas of the skin once daily as needed. 400 g 1   triamcinolone ointment (KENALOG) 0.1 % Apply 1 Application topically 2 (two) times daily. 80 g 1   Vitamin D, Cholecalciferol, 10 MCG (400 UNIT) TABS Take 1 tablet by mouth daily at 6 (six) AM.     gabapentin (NEURONTIN) 300 MG capsule Take 1 capsule (300 mg total) by mouth once nightly at bedtime. (Patient not taking: Reported on 06/25/2022) 30 capsule 3   lenvatinib 8 mg daily dose (LENVIMA) 2 x 4 MG capsule Take 2 capsules (8 mg total) by mouth daily. (Patient not taking: Reported on 06/25/2022) 60 capsule 3   Current Facility-Administered Medications on File Prior to Visit  Medication Dose Route Frequency Provider Last Rate Last Admin   sodium chloride flush (NS) 0.9 % injection 10 mL  10 mL Intravenous PRN Erenest Blank, NP   10 mL at 12/18/20 1009    Allergies  Allergen Reactions   Bactrim [Sulfamethoxazole-Trimethoprim] Swelling   Clarithromycin Swelling and Rash    Daughter is unsure if Clarithromycin or Amoxicillin caused rash and swelling.  Patient states no allergy to amoxicillan - has  taken this recently with no issues. (12/02/21)    Social History   Socioeconomic History   Marital status: Widowed    Spouse name: Not on file   Number of children: 5   Years of education: 14   Highest education level: Associate degree: occupational, Scientist, product/process development, or vocational program  Occupational History   Not on file  Tobacco Use   Smoking status: Never   Smokeless tobacco: Never  Vaping Use   Vaping Use: Never used  Substance and Sexual Activity   Alcohol use: Not Currently   Drug use: Never   Sexual activity: Not Currently  Other Topics Concern   Not on file  Social History Narrative   Originally from [place].  Speaks English and [place]   Social Determinants of Corporate investment banker Strain: Not on file  Food Insecurity: Not on file  Transportation Needs: Not on file  Physical Activity: Not on file  Stress: Not on file  Social Connections: Not on file  Intimate Partner Violence: Not on file    Family History  Problem Relation Age of Onset   Cancer Father        type unknown, was on the leg   Other Daughter        Acoustic neuroma   Breast cancer Neg Hx    Colon cancer Neg Hx    Esophageal cancer Neg Hx    Rectal cancer Neg Hx    Stomach cancer Neg Hx     Past Surgical History:  Procedure Laterality Date   COLON RESECTION  2005   In [place] - ?colon cancer   IR IMAGING GUIDED PORT INSERTION  09/18/2020   LAPAROSCOPIC GASTRIC RESECTION N/A 08/09/2020   Procedure: LAPAROSCOPIC EXPLORATION OF ABDOMEN, PERFORATED ULCER REPAIR, LIVER BIOPSY;  Surgeon: Karie Soda, MD;  Location: [facility];  Service: General;  Laterality: N/A;    ROS: Review of Systems Negative except as stated above  PHYSICAL EXAM: BP 127/74 (BP Location: Left Arm, Patient Position: Sitting, Cuff Size: Normal)   Pulse 80   Temp 98.4 F (36.9 C) (Oral)   Ht 5\' 4"  (1.626 m)   Wt 159 lb (72.1 kg)   SpO2 97%   BMI 27.29 kg/m   Physical Exam  General appearance - alert, well  appearing, and in no distress Mental status - normal mood, behavior, speech, dress, motor activity, and thought processes Neck - supple, no significant adenopathy Chest - clear to auscultation, no wheezes, rales or rhonchi, symmetric air entry Heart - normal rate, regular rhythm, normal S1, S2, no murmurs, rubs, clicks or gallops Extremities - peripheral pulses normal, no pedal edema, no clubbing or cyanosis Skin -patient has some hyperpigmented scarring on the legs but no active open wounds.  Skin looks pretty good compared to last year. Diabetic Foot Exam - Simple   Simple Foot Form Diabetic Foot exam was performed with the following findings: Yes 06/25/2022  4:07 PM  Visual Inspection See comments: Yes Sensation Testing Intact to touch and monofilament testing bilaterally: Yes Pulse Check Posterior Tibialis and Dorsalis pulse intact bilaterally: Yes Comments Dry skin on both plantar heels.          Latest Ref Rng & Units 06/18/2022    8:40 AM 02/11/2022    7:48 AM 01/22/2022    8:49 AM  CMP  Glucose 70 - 99 mg/dL 161  096  045   BUN 8 - 23 mg/dL 23  14  11    Creatinine 0.44 - 1.00 mg/dL 4.09  8.11  9.14   Sodium 135 - 145 mmol/L 139  138  140   Potassium 3.5 - 5.1 mmol/L 3.8  3.8  4.3   Chloride 98 - 111 mmol/L 106  105  104   CO2 22 - 32 mmol/L 27  26  20    Calcium 8.9 - 10.3 mg/dL 78.2  9.9  95.6   Total Protein 6.5 - 8.1 g/dL 7.0  6.7    Total Bilirubin 0.3 - 1.2 mg/dL 0.5  0.7    Alkaline Phos 38 - 126 U/L 82  102    AST 15 - 41 U/L 22  14    ALT 0 - 44 U/L 29  22  Lipid Panel  No results found for: "CHOL", "TRIG", "HDL", "CHOLHDL", "VLDL", "LDLCALC", "LDLDIRECT"  CBC    Component Value Date/Time   WBC 5.7 06/18/2022 0840   WBC 4.5 08/24/2021 0858   RBC 4.78 06/18/2022 0840   HGB 14.2 06/18/2022 0840   HCT 42.0 06/18/2022 0840   PLT 177 06/18/2022 0840   MCV 87.9 06/18/2022 0840   MCH 29.7 06/18/2022 0840   MCHC 33.8 06/18/2022 0840   RDW 16.9 (H)  06/18/2022 0840   LYMPHSABS 1.8 06/18/2022 0840   MONOABS 0.3 06/18/2022 0840   EOSABS 0.0 06/18/2022 0840   BASOSABS 0.0 06/18/2022 0840    ASSESSMENT AND PLAN: 1. Hypertension associated with diabetes (HCC) At goal.  Continue Procardia 30 mg twice a day.  2. Type 2 diabetes mellitus with peripheral neuropathy (HCC) Controlled.  Continue healthy eating habits.  Continue Lantus insulin 12 units daily and Humalog insulin 14/14/12.  Her endocrinologist has given her instructions of how to taper the Humalog as the prednisone is tapered. 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  Prednisone slowly being tapered.  4. Status post right cataract extraction Recommend that she sees an eye doctor here as her prescription glasses may need to be changed since having had cataract surgery  5. Hepatocellular carcinoma (HCC) Followed by Dr. Myna Hidalgo.  6. Other cough Patient reports that this has since resolved.  Advised patient to go upstairs on the fourth floor to see if she can sign up for Medicaid.  Patient was given the opportunity to ask questions.  Patient verbalized understanding of the plan and was able to repeat key elements of the plan.   This documentation was completed using Paediatric nurse.  Any transcriptional errors are unintentional.  Orders Placed This Encounter  Procedures   Microalbumin / creatinine urine ratio     Requested Prescriptions    No prescriptions requested or ordered in this encounter    Return in about 4 months (around 10/26/2022).  Jonah Blue, MD, FACP

## 2022-06-25 NOTE — Patient Instructions (Signed)
Please go to the Medicaid office on the fourth floor to see if you qualify for [facility].  Please see an eye doctor to see whether your prescription glasses need to be changed since you had cataract extraction.  Ask your oncologist on next visit whether it is okay for [place] to go ahead and give you the Tdap vaccine which is tetanus, diphtheria and pertussis.

## 2022-06-26 ENCOUNTER — Encounter: Payer: Self-pay | Admitting: Internal Medicine

## 2022-06-26 DIAGNOSIS — R809 Proteinuria, unspecified: Secondary | ICD-10-CM | POA: Insufficient documentation

## 2022-06-26 LAB — MICROALBUMIN / CREATININE URINE RATIO
Creatinine, Urine: 138.9 mg/dL
Microalb/Creat Ratio: 709 mg/g creat — ABNORMAL HIGH (ref 0–29)
Microalbumin, Urine: 985.4 ug/mL

## 2022-06-29 ENCOUNTER — Encounter: Payer: Self-pay | Admitting: Internal Medicine

## 2022-06-29 ENCOUNTER — Ambulatory Visit (INDEPENDENT_AMBULATORY_CARE_PROVIDER_SITE_OTHER): Payer: No Typology Code available for payment source | Admitting: Internal Medicine

## 2022-06-29 ENCOUNTER — Other Ambulatory Visit: Payer: Self-pay

## 2022-06-29 ENCOUNTER — Ambulatory Visit: Payer: Self-pay | Attending: Family Medicine

## 2022-06-29 VITALS — BP 132/69 | HR 71 | Temp 97.3°F | Ht 64.0 in | Wt 157.0 lb

## 2022-06-29 DIAGNOSIS — K7460 Unspecified cirrhosis of liver: Secondary | ICD-10-CM

## 2022-06-29 DIAGNOSIS — C220 Liver cell carcinoma: Secondary | ICD-10-CM

## 2022-06-29 DIAGNOSIS — B182 Chronic viral hepatitis C: Secondary | ICD-10-CM

## 2022-06-29 NOTE — Patient Instructions (Addendum)
You are cured from hepatitis c   You can get reinfected again so avoid sharing with anyone personal hygiene equipment like shaver, tooth brush    Please follow up with oncology and gastroenterology to continue monitoring your liver cancer and cirrhosis    You do not need to see [place] any further   Please discuss the rash with oncology and dermatology.

## 2022-06-29 NOTE — Progress Notes (Signed)
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   MRN: [id number]  No chief complaint on file.     HPI:  [name] [name] is a 77 y.o. female with hcc/chronic hep c, here for evaluation for hep c treatment  She is known to our clinic. When she was first evaluated a year ago, decision to treat hep c was deferred given her active bulky HCC  She has been undergoing hcc treatment with oncology via immune therapy and her lesion has improved significantly on 02/2021 mra. There is a plan to repeat mra in 05/2021  She is feeling well. No fatigue, ascites, blood/melena per rectum, joint pain, skin rash  She has mild-moderate lft elevation that is stable  Of note, the mra did  mention sign of cirrhosis and portal hypertension with trace ascites She has egd 3/09 which saw gastritis but no varices. Colonoscopy same day also no varices except sigmoid diverticulosis  She never needed paracentesis or uses  diuretics for hx of ascites. No hx gib -------------------- 06/17/21 id f/u Checkpoint inhibitors hold due to lft up -- assymptomatic Feels well/baseline Dr Myna Hidalgo wants to treat her for hep c -- recent repeat mri stable/improved but still evidence of hcc    08/20/21 id f/u She has started epclusa 6 months planned About 4-5 weeks into epclusa developed a burning/itch rash started on feet then spread centrally Started with a bump then enlarge into vessicle/blister/papule and then can coalesce and form large blisters No fever, chill No arthralgia No myalgia No headache No visual change No hematuria No abd pain  She was given abx for cellulitis without help She was given topical steroid for thought of dishydrosis but didn't help  Where the lesions coalesced she has pain and keeping her awake   8/31 id clinic Patient's rash biopsied and seen by derm. Rash lichenoid dermatosis. Nivolumab was stopped/steroid started with significant improvement/resolution. She is very happy  She had been continuing epclusa without problem She is on pred 10 mg at this time daily  Oncology had just restarted nivolumab  Tolerating epclusa. Finishing 4th month  Recent labs from oncology indicate normal lft    12/02/21 id clinic Patient doing well. Is into her 1st week of the 6th month of epclusa She was restarted on her nivolumab august 2023 Her rashed was improving on prednisone while kept on epclusa. She now reported new bumps/rash in her feet about 2-3 weeks ago when she went down from 5 mg to 2.5 mg pred a day  No other complaint Planned on  going to [place] 01/2022, during which she'll be temporarily on sorafenib by oncology for her hcc until she gets back (3 month trip planned) and she'll be back on nivolumab.    06/29/22 id clinic f/u Patient finished 6 months of epclusa by 12/2021 Her last hcv viral load testing on 06/22/22 was <15 (undetectable) and suggested treatment success She  continues treatment for hcc with dr Twanna Hy  She is finishing off the prednisone taper. Has been on it for about 6 months. At this time she is 7.5 mg daily  She noted recurrent nodules/blister on legs/arms now at this prednisone dose. She is currently on checkpoint inhibitor still     Allergies  Allergen Reactions   Bactrim [Sulfamethoxazole-Trimethoprim] Swelling   Clarithromycin Swelling and Rash    Daughter is unsure if Clarithromycin or Amoxicillin caused rash and swelling.  Patient states no allergy to amoxicillan - has taken this recently with no issues. (12/02/21)      Outpatient Medications Prior to Visit  Medication Sig Dispense Refill   brimonidine (ALPHAGAN) 0.2 % ophthalmic solution 1 drop 2 (two) times daily.     clobetasol ointment (TEMOVATE) 0.05 % Apply twice a day on affected areas 60 g 5   COVID-19 mRNA vaccine 2023-2024 (COMIRNATY) syringe Inject into the muscle. 0.3 mL 0   gabapentin (NEURONTIN) 300 MG capsule Take 1 capsule (300 mg total) by mouth once nightly at bedtime. (Patient not taking: Reported on 06/25/2022) 30 capsule 3   glucose blood (TRUE METRIX BLOOD GLUCOSE TEST) test strip Use as instructed 100 each 12   insulin glargine (LANTUS SOLOSTAR) 100 UNIT/ML Solostar Pen Inject 12 Units into the skin daily. 15 mL 6   insulin lispro (HUMALOG KWIKPEN) 200 UNIT/ML KwikPen Max daily dose 100 units. 60 mL 0   Insulin Pen Needle 32G X 4 MM MISC Use with insulins daily. 400 each 3   lenvatinib 8 mg daily dose (LENVIMA) 2 x 4 MG capsule Take 2 capsules (8 mg total) by mouth daily. (Patient not taking: Reported on 06/25/2022) 60 capsule 3   lidocaine-prilocaine (EMLA) cream Apply 1 Application topically as needed. 30 g 2   losartan (COZAAR) 100 MG tablet Take 1 tablet (100 mg total) by mouth at bedtime. 120 tablet 0   Magnesium 400 MG TABS Take 1 tablet by mouth daily at 6 (six) AM.     NIFEdipine (PROCARDIA-XL/NIFEDICAL-XL) 30 MG 24 hr tablet Take 1 tablet (30 mg  total) by mouth in the morning and at bedtime. 60 tablet 0   pantoprazole (PROTONIX) 40 MG tablet Take 1 tablet (40 mg total) by mouth 2 (two) times daily. 240 tablet 0   predniSONE (DELTASONE) 20 MG tablet Take 1 tablet (20 mg total) by mouth daily with breakfast. 30 tablet 0   predniSONE (DELTASONE) 5 MG tablet Take 3.5 tablets (17.5 mg total) by mouth daily for 30 days, THEN 3 tablets (15 mg total) daily for 30 days, THEN 2.5 tablets (12.5 mg total) daily for 30 days, THEN 2.5 tablets (12.5 mg total) daily for 30 days, THEN 2 tablets (10 mg total) daily for 30 days. 405 tablet 0   silver sulfADIAZINE (SILVADENE) 1 % cream Apply to the open, ulcerated areas of the skin once daily as needed. 400 g 1   silver sulfADIAZINE (SILVADENE) 1 % cream Apply to the open, ulcerated areas of the skin once daily as needed. 400 g 1   triamcinolone ointment (KENALOG) 0.1 % Apply 1 Application topically 2 (two) times  daily. 80 g 1   Vitamin D, Cholecalciferol, 10 MCG (400 UNIT) TABS Take 1 tablet by mouth daily at 6 (six) AM.     Facility-Administered Medications Prior to Visit  Medication Dose Route Frequency Provider Last Rate Last Admin   sodium chloride flush (NS) 0.9 % injection 10 mL  10 mL Intravenous PRN Erenest Blank, NP   10 mL at 12/18/20 1009     Social History   Socioeconomic History   Marital status: Widowed    Spouse name: Not on file   Number of children: 5   Years of education: 14   Highest education level: Associate degree: occupational, Scientist, product/process development, or vocational program  Occupational History   Not on file  Tobacco Use   Smoking status: Never   Smokeless tobacco: Never  Vaping Use   Vaping Use: Never used  Substance and Sexual Activity   Alcohol use: Not Currently   Drug use: Never   Sexual activity: Not Currently  Other Topics Concern   Not on file  Social History Narrative   Originally from [place].  Speaks [place] and [place]   Social Determinants of Research scientist (physical sciences) Strain: Not on [employer] Insecurity: Not on file  Transportation Needs: Not on file  Physical Activity: Not on file  Stress: Not on file  Social Connections: Not on file  Intimate Partner Violence: Not on file      Review of Systems    All other ros negative  Objective:    There were no vitals taken for this visit. Nursing note and vital signs reviewed.  Physical Exam     General/constitutional: no distress, pleasant HEENT: Normocephalic, PER, Conj Clear, EOMI, Oropharynx clear Neck supple CV: rrr no mrg Lungs: clear to auscultation, normal respiratory effort Abd: Soft, Nontender Ext: no edema Skin: nodules and blister (pustular appearing on RLE and LUE) Neuro: nonfocal MSK: no peripheral joint swelling/tenderness/warmth; back spines nontender     Labs: Lab Results  Component Value Date   WBC 5.7 06/18/2022   HGB 14.2 06/18/2022   HCT 42.0 06/18/2022   MCV 87.9 06/18/2022   PLT 177 06/18/2022   Last metabolic panel Lab Results  Component Value Date   GLUCOSE 167 (H) 06/18/2022   NA 139 06/18/2022   K 3.8 06/18/2022   CL 106 06/18/2022   CO2 27 06/18/2022   BUN 23 06/18/2022   CREATININE 0.70 06/18/2022   GFRNONAA >60 06/18/2022   CALCIUM 10.0 06/18/2022   PHOS 2.5 08/10/2020   PHOS 2.5 08/10/2020   PROT 7.0 06/18/2022   ALBUMIN 3.6 06/18/2022   BILITOT 0.5 06/18/2022   ALKPHOS 82 06/18/2022   AST 22 06/18/2022   ALT 29 06/18/2022   ANIONGAP 6 06/18/2022   No results found for: "INR", "PROTIME"   Micro:  Serology:  Imaging: 05/30/21 mri liver 1. Continued interval reduction in size of a dominant mass of hepatic segment III as well as additional smaller masses, consistent continued treatment response of biopsy proven hepatocellular carcinoma. 2. Unchanged subcentimeter arterially hyperenhancing focus of the liver dome, although distinct in enhancement characteristics from other multifocal hepatocellular carcinoma lesions, of  intermediate suspicion for hepatocellular carcinoma. This is a LI-RADS category 3 lesion if so characterized. Attention on follow-up. 3. No new suspicious lesions identified. 4. Trace ascites. 5. Cholelithiasis.   02/19/21 mr abdomen 1. Marked interval response to therapy with diminished size of all visible lesions, near complete resolution of most lesions aside from the largest  lesion which likely a small amount of residual viable tumor. 2. No new lesions. 3. Signs of cirrhosis and portal hypertension. 4. Cholelithiasis. 5. Diminished ascites with only trace ascites and with resolution of third spacing that was seen previously.    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 The reverse seems to be true (with active hcc, the rate of cure of hep c with DAA is much lower). As she only has 2 shots at DAA I would like to see her at least stable from [facility] standpoint in terms of cure for 3-6 months before initiating DAA  I agree that treating hep C successfully will reduce the [facility] recurrent rate and also improve all-cause mortality for her   --------------------- 06/17/21 assessment Patient from malignancy standpoint stable but evidence of disease I agree with oncology regarding the recent uptick in lft likely related to checkpoint inhibitor  Will need hcv genotype/rna quant for insurance authorization  Given cirhosis/decompensation (ascites) will plan 24 weeks of epclusa. She is unlikely a good candidate for  interferon co-treatment  She needs to see ongoing GI follow up for routine variceal monitoring/ablation as needed   -labs as discussed -refer to pharmacy to discuss epclusa treamtent -- 24 weeks plan -see me again no earlier than 3 months after the 24 weeks of epclusa done. -no need for elastography   -discussed natural progression of hep c, transmission (avoid sharing personal hygiene equipment) -discussed avoid toxin like etoh and excessive acetamaminphen (no more than 2 gram a day) -discussed healthy life style and good glucose control -discussed avoiding eating raw sea food -discussed we can treat hep c but can be reinfected -discussed hepatitis coinfection and vaccination  ------------- 08/20/21 assessment Rash suspect cryo vs lcv vasculitis like rash Doesn't appear to be cutaneous porphyria I do not think this is epclusa drug rash  Will check rpr/syphilis and gc/chlam (keratoderma blenorrhagicum)  Continue epclusa Refer to dermatology dr Elouise Munroe Stop abx/topical steroid Cryoglobulin Anca  Ibuprofen as needed for pain  ----------- 8/31 assessment Rash resolved on prednisone and off nivolumab (which was just restarted by oncology) Explained to her again the dermatitis of nivolumab can occur any time while on nivolumab if that was cause Doesn't appear rash was due to epclusa   She has 2 and a half month left of epclusa for 6 months tx in setting decompensated cirrhosis  Labs done recently looks good   See me 8 weeks and then again in 05/2022 for test of cure testing  I have spent a total of 20 minutes of face-to-face and non-face-to-face time, excluding clinical staff time, preparing to see patient, ordering tests and/or medications, and provide counseling the patient   12/02/21 id assessment Doing well from hep c standpoint. Due to virologic testing 05/2022. 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      Raymondo Band, MD [facility] [phone]  pager   [phone] cell 06/29/2022, 9:36 AM

## 2022-06-30 ENCOUNTER — Other Ambulatory Visit: Payer: Self-pay | Admitting: Internal Medicine

## 2022-06-30 ENCOUNTER — Encounter: Payer: Self-pay | Admitting: Internal Medicine

## 2022-06-30 DIAGNOSIS — Z9841 Cataract extraction status, right eye: Secondary | ICD-10-CM

## 2022-06-30 DIAGNOSIS — H5461 Unqualified visual loss, right eye, normal vision left eye: Secondary | ICD-10-CM

## 2022-07-13 ENCOUNTER — Other Ambulatory Visit: Payer: Self-pay | Admitting: Internal Medicine

## 2022-07-13 ENCOUNTER — Encounter: Payer: Self-pay | Admitting: Hematology & Oncology

## 2022-07-13 ENCOUNTER — Other Ambulatory Visit: Payer: Self-pay

## 2022-07-14 ENCOUNTER — Encounter: Payer: Self-pay | Admitting: Hematology & Oncology

## 2022-07-14 ENCOUNTER — Ambulatory Visit (HOSPITAL_BASED_OUTPATIENT_CLINIC_OR_DEPARTMENT_OTHER)
Admission: RE | Admit: 2022-07-14 | Discharge: 2022-07-14 | Disposition: A | Discharge status: Home / Self Care | Payer: No Typology Code available for payment source | Source: Ambulatory Visit | Attending: Hematology & Oncology | Admitting: Hematology & Oncology

## 2022-07-14 ENCOUNTER — Other Ambulatory Visit: Payer: Self-pay

## 2022-07-14 DIAGNOSIS — C220 Liver cell carcinoma: Secondary | ICD-10-CM | POA: Insufficient documentation

## 2022-07-14 MED ORDER — GADOBUTROL 1 MMOL/ML IV SOLN
7.1000 mL | Freq: Once | INTRAVENOUS | Status: AC | PRN
  Administered 2022-07-14: 7.1 mL via INTRAVENOUS

## 2022-07-14 MED ORDER — NIFEDIPINE ER OSMOTIC RELEASE 30 MG PO TB24
30.0000 mg | ORAL_TABLET | Freq: Two times a day (BID) | ORAL | 1 refills | Status: DC
  Filled 2022-07-14: qty 60, 30d supply, fill #0
  Filled 2022-08-19: qty 60, 30d supply, fill #1
  Filled 2022-09-20 – 2022-09-24 (×2): qty 60, 30d supply, fill #2
  Filled 2022-10-14 – 2022-10-19 (×2): qty 60, 30d supply, fill #3

## 2022-07-16 ENCOUNTER — Inpatient Hospital Stay: Payer: No Typology Code available for payment source

## 2022-07-16 ENCOUNTER — Emergency Department (HOSPITAL_COMMUNITY)
Admission: EM | Admit: 2022-07-16 | Discharge: 2022-07-16 | Disposition: A | Discharge status: Other Acute Inpatient Hospital | Payer: No Typology Code available for payment source | Attending: Emergency Medicine | Admitting: Emergency Medicine

## 2022-07-16 ENCOUNTER — Encounter: Payer: Self-pay | Admitting: Hematology & Oncology

## 2022-07-16 ENCOUNTER — Other Ambulatory Visit: Payer: Self-pay

## 2022-07-16 ENCOUNTER — Encounter (HOSPITAL_COMMUNITY): Payer: Self-pay | Admitting: *Deleted

## 2022-07-16 ENCOUNTER — Inpatient Hospital Stay (HOSPITAL_BASED_OUTPATIENT_CLINIC_OR_DEPARTMENT_OTHER): Payer: No Typology Code available for payment source | Admitting: Hematology & Oncology

## 2022-07-16 VITALS — BP 149/77 | HR 70 | Temp 97.8°F | Resp 20 | Ht 64.0 in | Wt 157.1 lb

## 2022-07-16 DIAGNOSIS — C220 Liver cell carcinoma: Secondary | ICD-10-CM

## 2022-07-16 DIAGNOSIS — H5461 Unqualified visual loss, right eye, normal vision left eye: Secondary | ICD-10-CM | POA: Insufficient documentation

## 2022-07-16 DIAGNOSIS — H44001 Unspecified purulent endophthalmitis, right eye: Secondary | ICD-10-CM | POA: Insufficient documentation

## 2022-07-16 LAB — CBC
HCT: 41.2 % (ref 36.0–46.0)
Hemoglobin: 13.4 g/dL (ref 12.0–15.0)
MCH: 29.3 pg (ref 26.0–34.0)
MCHC: 32.5 g/dL (ref 30.0–36.0)
MCV: 90 fL (ref 80.0–100.0)
Platelets: 187 10*3/uL (ref 150–400)
RBC: 4.58 MIL/uL (ref 3.87–5.11)
RDW: 15.1 % (ref 11.5–15.5)
WBC: 5.8 10*3/uL (ref 4.0–10.5)
nRBC: 0 % (ref 0.0–0.2)

## 2022-07-16 LAB — CBC WITH DIFFERENTIAL (CANCER CENTER ONLY)
Abs Immature Granulocytes: 0.01 10*3/uL (ref 0.00–0.07)
Basophils Absolute: 0 10*3/uL (ref 0.0–0.1)
Basophils Relative: 0 %
Eosinophils Absolute: 0.1 10*3/uL (ref 0.0–0.5)
Eosinophils Relative: 1 %
HCT: 42.2 % (ref 36.0–46.0)
Hemoglobin: 14.2 g/dL (ref 12.0–15.0)
Immature Granulocytes: 0 %
Lymphocytes Relative: 31 %
Lymphs Abs: 1.5 10*3/uL (ref 0.7–4.0)
MCH: 30 pg (ref 26.0–34.0)
MCHC: 33.6 g/dL (ref 30.0–36.0)
MCV: 89.2 fL (ref 80.0–100.0)
Monocytes Absolute: 0.3 10*3/uL (ref 0.1–1.0)
Monocytes Relative: 6 %
Neutro Abs: 3 10*3/uL (ref 1.7–7.7)
Neutrophils Relative %: 62 %
Platelet Count: 202 10*3/uL (ref 150–400)
RBC: 4.73 MIL/uL (ref 3.87–5.11)
RDW: 14.8 % (ref 11.5–15.5)
WBC Count: 4.8 10*3/uL (ref 4.0–10.5)
nRBC: 0 % (ref 0.0–0.2)

## 2022-07-16 LAB — BASIC METABOLIC PANEL
Anion gap: 7 (ref 5–15)
BUN: 16 mg/dL (ref 8–23)
CO2: 22 mmol/L (ref 22–32)
Calcium: 10.1 mg/dL (ref 8.9–10.3)
Chloride: 108 mmol/L (ref 98–111)
Creatinine, Ser: 0.75 mg/dL (ref 0.44–1.00)
GFR, Estimated: >60 mL/min (ref >60)
Glucose, Bld: 109 mg/dL — ABNORMAL HIGH (ref 70–99)
Potassium: 3.7 mmol/L (ref 3.5–5.1)
Sodium: 137 mmol/L (ref 135–145)

## 2022-07-16 LAB — CMP (CANCER CENTER ONLY)
ALT: 22 U/L (ref 0–44)
AST: 18 U/L (ref 15–41)
Albumin: 4.1 g/dL (ref 3.5–5.0)
Alkaline Phosphatase: 88 U/L (ref 38–126)
Anion gap: 6 (ref 5–15)
BUN: 16 mg/dL (ref 8–23)
CO2: 26 mmol/L (ref 22–32)
Calcium: 11.1 mg/dL — ABNORMAL HIGH (ref 8.9–10.3)
Chloride: 109 mmol/L (ref 98–111)
Creatinine: 0.7 mg/dL (ref 0.44–1.00)
GFR, Estimated: >60 mL/min (ref >60)
Glucose, Bld: 202 mg/dL — ABNORMAL HIGH (ref 70–99)
Potassium: 3.9 mmol/L (ref 3.5–5.1)
Sodium: 141 mmol/L (ref 135–145)
Total Bilirubin: 0.5 mg/dL (ref 0.3–1.2)
Total Protein: 7.6 g/dL (ref 6.5–8.1)

## 2022-07-16 LAB — HEPATITIS C ANTIBODY: HCV Ab: REACTIVE — AB

## 2022-07-16 LAB — LACTATE DEHYDROGENASE: LDH: 170 U/L (ref 98–192)

## 2022-07-16 MED ORDER — LEVOFLOXACIN IN D5W 750 MG/150ML IV SOLN
750.0000 mg | INTRAVENOUS | Status: DC
  Administered 2022-07-16: 750 mg via INTRAVENOUS
  Filled 2022-07-16: qty 150

## 2022-07-16 MED ORDER — SODIUM CHLORIDE 0.9 % IV SOLN
480.0000 mg | Freq: Once | INTRAVENOUS | Status: AC
  Administered 2022-07-16: 480 mg via INTRAVENOUS
  Filled 2022-07-16: qty 48

## 2022-07-16 MED ORDER — HEPARIN SOD (PORK) LOCK FLUSH 100 UNIT/ML IV SOLN
500.0000 [IU] | Freq: Once | INTRAVENOUS | Status: AC | PRN
  Administered 2022-07-16: 500 [IU]

## 2022-07-16 MED ORDER — HEPARIN SOD (PORK) LOCK FLUSH 100 UNIT/ML IV SOLN
500.0000 [IU] | Freq: Once | INTRAVENOUS | Status: AC
  Administered 2022-07-16: 500 [IU]
  Filled 2022-07-16: qty 5

## 2022-07-16 MED ORDER — FLUORESCEIN SODIUM 1 MG OP STRP
1.0000 | ORAL_STRIP | Freq: Once | OPHTHALMIC | Status: AC
  Administered 2022-07-16: 1 via OPHTHALMIC
  Filled 2022-07-16: qty 1

## 2022-07-16 MED ORDER — SODIUM CHLORIDE 0.9 % IV SOLN: Freq: Once | INTRAVENOUS | Status: AC

## 2022-07-16 MED ORDER — TETRACAINE HCL 0.5 % OP SOLN
2.0000 [drp] | Freq: Once | OPHTHALMIC | Status: AC
  Administered 2022-07-16: 2 [drp] via OPHTHALMIC
  Filled 2022-07-16: qty 4

## 2022-07-16 MED ORDER — SODIUM CHLORIDE 0.9% FLUSH
10.0000 mL | INTRAVENOUS | Status: DC | PRN
  Administered 2022-07-16: 10 mL

## 2022-07-16 MED ORDER — LIDOCAINE-EPINEPHRINE-TETRACAINE (LET) TOPICAL GEL
3.0000 mL | Freq: Once | TOPICAL | Status: AC
  Administered 2022-07-16: 3 mL via TOPICAL
  Filled 2022-07-16: qty 3

## 2022-07-16 NOTE — Discharge Instructions (Addendum)
You are being transferred from our emergency department to [facility] emergency department.  You have a potential infection in your right eye that may be very serious, and needs the attention of the specialist.  Please have your family member drive you directly to the [facility] Emergency Department at [facility], [place] [address] (Phone [phone]).    I spoke to Dr. Janifer Adie who is the on-call ophthalmologist or eye doctor, and they should be expecting you in the emergency department.  Please bring these papers to the triage.

## 2022-07-16 NOTE — ED Notes (Signed)
Called report to [facility] ED Charge RN, they had no further questions, reviewed transfer instructions via POV to pt and pt daughter at bedside. Provided pt and daughter opportunity for questions, they had no further questions. Will discharge pt from ED following abx completion.

## 2022-07-16 NOTE — Progress Notes (Signed)
Hematology and Oncology Follow Up Visit  Rovena Ochs [id number] 1946/01/27 77 y.o. 07/16/2022   Principle Diagnosis:  Hepatocellular carcinoma-multifocal Hepatitis C   Current Therapy:        Status post cycle 1 of nivolumab/ipilimumab -- d/c on 10/13/2020 due to hepatic toxicity Nivolumab 480 mg IV every 4 weeks - s/p cycle #20 -- started 11/21/2020 --changed to 480 mg on 08/2021 Nexavar 200 mg po BID -- to be taken while in [place] -- start on 02/26/2022 -- d/c on 06/16/2022   Interim History:  Ms. Oneika Exner is here today for follow-up.  She is doing quite well.  She feels well.  Her blood sugars are little on the high side.  This is typically the case for her.  She has had no issues with nausea or vomiting.  She has had no problems with cough or shortness of breath.  She did have some cataract surgery for the right eye.  Unfortunately this still has had issues.  Hopefully, this will resolve.  She has had no fever.  There is been no obvious bleeding.  She has had no leg swelling.  She is only on 5 mg of prednisone a day for the skin rash.  The rash seems to be improving.  She had MRI of the liver that was done a couple days ago.  The results are still not back yet.  Her last alpha-fetoprotein was 8.3.  I am not surprised by this.  Overall, I would say that her performance status is probably ECOG 1.     Medications:  Allergies as of 07/16/2022       Reactions   Bactrim [sulfamethoxazole-trimethoprim] Swelling   Clarithromycin Swelling, Rash   Daughter is unsure if Clarithromycin or Amoxicillin caused rash and swelling. Patient states no allergy to amoxicillan - has taken this recently with no issues. (12/02/21)        Medication List        Accurate as of Jul 16, 2022  8:54 AM. If you have any questions, ask your nurse or doctor.          STOP taking these medications    Comirnaty syringe Generic drug: COVID-19 mRNA vaccine  2023-2024 Stopped by: [name], MD       TAKE these medications    Basaglar KwikPen 100 UNIT/ML Inject 12 Units into the skin daily.   brimonidine 0.2 % ophthalmic solution Commonly known as: ALPHAGAN 1 drop 2 (two) times daily.   clobetasol ointment 0.05 % Commonly known as: TEMOVATE Apply twice a day on affected areas   gabapentin 300 MG capsule Commonly known as: NEURONTIN Take 1 capsule (300 mg total) by mouth once nightly at bedtime.   insulin lispro 100 UNIT/ML injection Commonly known as: HUMALOG SSI with meals: 200-250 3 units, 251-300 6 units, 301-350 9 units, 351-400 12 units, 401-450 15 units, > 451 18 units under the skin as directed What changed: Another medication with the same name was changed. Make sure you understand how and when to take each.   HumaLOG KwikPen 200 UNIT/ML KwikPen Generic drug: insulin lispro Max daily dose 100 units. What changed: additional instructions   lenvatinib 8 mg daily dose 2 x 4 MG capsule Commonly known as: LENVIMA Take 2 capsules (8 mg total) by mouth daily.   lidocaine-prilocaine cream Commonly known as: EMLA Apply 1 Application topically as needed.   losartan 25 MG tablet Commonly known as: COZAAR Take 25 mg by mouth daily.  losartan 100 MG tablet Commonly known as: COZAAR Take 1 tablet (100 mg total) by mouth at bedtime.   Magnesium 400 MG Tabs Take 1 tablet by mouth daily at 6 (six) AM.   NIFEdipine 30 MG 24 hr tablet Commonly known as: PROCARDIA-XL/NIFEDICAL-XL Take 1 tablet (30 mg total) by mouth in the morning and at bedtime.   pantoprazole 40 MG tablet Commonly known as: Protonix Take 1 tablet (40 mg total) by mouth 2 (two) times daily.   polyethylene glycol powder 17 GM/SCOOP powder Commonly known as: GLYCOLAX/MIRALAX Take by mouth daily as needed.   predniSONE 5 MG tablet Commonly known as: DELTASONE Take 3.5 tablets (17.5 mg total) by mouth daily for 30 days, THEN 3 tablets (15 mg  total) daily for 30 days, THEN 2.5 tablets (12.5 mg total) daily for 30 days, THEN 2.5 tablets (12.5 mg total) daily for 30 days, THEN 2 tablets (10 mg total) daily for 30 days. What changed: Another medication with the same name was removed. Continue taking this medication, and follow the directions you see here. Changed by: [name], MD   SSD 1 % cream Generic drug: silver sulfADIAZINE Apply to the open, ulcerated areas of the skin once daily as needed.   TechLite Pen Needles 32G X 4 MM Misc Generic drug: Insulin Pen Needle Use with insulins daily.   triamcinolone ointment 0.1 % Commonly known as: KENALOG Apply 1 Application topically 2 (two) times daily.   True Metrix Blood Glucose Test test strip Generic drug: glucose blood Use as instructed   Vitamin D (Cholecalciferol) 10 MCG (400 UNIT) Tabs Take 1 tablet by mouth daily at 6 (six) AM.        Allergies:  Allergies  Allergen Reactions   Bactrim [Sulfamethoxazole-Trimethoprim] Swelling   Clarithromycin Swelling and Rash    Daughter is unsure if Clarithromycin or Amoxicillin caused rash and swelling.  Patient states no allergy to amoxicillan - has taken this recently with no issues. (12/02/21)    Past Medical History, Surgical history, Social history, and Family History were reviewed and updated.  Review of Systems: Review of Systems  Constitutional: Negative.   HENT: Negative.    Eyes: Negative.   Respiratory: Negative.    Cardiovascular: Negative.   Gastrointestinal: Negative.   Genitourinary: Negative.   Musculoskeletal: Negative.   Skin: Negative.   Neurological:  Positive for tingling.  Endo/Heme/Allergies: Negative.   Psychiatric/Behavioral: Negative.       Physical Exam:  height is 5\' 4"  (1.626 m) and weight is 157 lb 1.3 oz (71.3 kg). Her oral temperature is 97.8 F (36.6 C). Her blood pressure is 149/77 (abnormal) and her pulse is 70. Her respiration is 20 and oxygen saturation is 99%.    Wt Readings from Last 3 Encounters:  07/16/22 157 lb 1.3 oz (71.3 kg)  06/29/22 157 lb (71.2 kg)  06/25/22 159 lb (72.1 kg)    Physical Exam Vitals reviewed.  HENT:     Head: Normocephalic and atraumatic.  Eyes:     Pupils: Pupils are equal, round, and reactive to light.  Cardiovascular:     Rate and Rhythm: Normal rate and regular rhythm.     Heart sounds: Normal heart sounds.  Pulmonary:     Effort: Pulmonary effort is normal.     Breath sounds: Normal breath sounds.  Abdominal:     General: Bowel sounds are normal.     Palpations: Abdomen is soft.     Comments: Abdominal exam shows a well-healed  laparotomy scar.  She has no fluid wave.  There is no guarding or rebound tenderness to palpation.  There is no palpable liver or spleen tip.  Musculoskeletal:        General: No tenderness or deformity. Normal range of motion.     Cervical back: Normal range of motion.  Lymphadenopathy:     Cervical: No cervical adenopathy.  Skin:    General: Skin is warm and dry.     Findings: No erythema or rash.     Comments: Skin exam does show a extensive but somewhat healing papular rash on her lower legs.  This is below the knees.  She has some dressing on the foot on the right foot.  She has no open wounds.  There is no blisters.  Neurological:     Mental Status: She is alert and oriented to person, place, and time.  Psychiatric:        Behavior: Behavior normal.        Thought Content: Thought content normal.        Judgment: Judgment normal.       Lab Results  Component Value Date   WBC 4.8 07/16/2022   HGB 14.2 07/16/2022   HCT 42.2 07/16/2022   MCV 89.2 07/16/2022   PLT 202 07/16/2022   Lab Results  Component Value Date   FERRITIN 603 (H) 10/02/2020   IRON 31 (L) 10/02/2020   TIBC 241 10/02/2020   UIBC 211 10/02/2020   IRONPCTSAT 13 (L) 10/02/2020   Lab Results  Component Value Date   RBC 4.73 07/16/2022   No results found for: "KPAFRELGTCHN", "LAMBDASER",  "KAPLAMBRATIO" No results found for: "IGGSERUM", "IGA", "IGMSERUM" No results found for: "TOTALPROTELP", "ALBUMINELP", "A1GS", "A2GS", "BETS", "BETA2SER", "GAMS", "MSPIKE", "SPEI"   Chemistry      Component Value Date/Time   NA 141 07/16/2022 0815   NA 140 01/22/2022 0849   K 3.9 07/16/2022 0815   CL 109 07/16/2022 0815   CO2 26 07/16/2022 0815   BUN 16 07/16/2022 0815   BUN 11 01/22/2022 0849   CREATININE 0.70 07/16/2022 0815   CREATININE 0.58 (L) 08/20/2021 1541      Component Value Date/Time   CALCIUM 11.1 (H) 07/16/2022 0815   ALKPHOS 88 07/16/2022 0815   AST 18 07/16/2022 0815   ALT 22 07/16/2022 0815   BILITOT 0.5 07/16/2022 0815       Impression and Plan: Ms. Adelis Males is a very pleasant 77 yo female from [place] with hepatocellular carcinoma and Hepatitis C.   She is now back from [place].  She was on Nexavar over in [place].  I had to believe that the Nexavar certainly helped.  She is back on nivolumab.  I think she is really doing well with this.  Hopefully, the MRI will confirm this.  We will continue to have her treated monthly.  We have not yet added Avastin.  We can always add Avastin if necessary.  I have tried to hold off on Avastin as long as possible since she had abdominal surgery which actually has been about 2 years ago.   [name], MD 5/31/20248:54 AM

## 2022-07-16 NOTE — Consult Note (Signed)
Ophthalmology Initial Consult Note   [name], [name], 77 y.o. female Date of Service:  07/16/2022 CSN: [id number]  MRN: [id number]   Requesting physician: No att. providers found  Information Obtained from: patient Chief Complaint:  painful vision loss OD  HPI/Discussion:  [name] [name] is a 77 y.o. female who presents with vision loss and pain in the right eye.  She has a history of long-standing severe glaucoma and cataracts with slowly progressive vision loss. She underwent cataract extraction in [place] in March, the postoperative course of which was complicated by the presence of blood clots which required subsequent washout of the anterior chamber.  She also underwent subconjunctival antibiotic injection.  She reports that her vision was worse after cataract surgery but she was not initially in pain until three days ago when she woke up with pain and even worse vision loss. The vision loss and pain have progressed steadily over the past few days to the point now where she can no longer make out shadows in the right eye.  She also endorses flashing lights, floaters, and curtains over her vision. She denies constitutional symptoms including fevers, chills, and night sweats.  Her medical history is significant for HepC and hepatocellular carcinoma currently on immunotherapy.  Her immunotherapy course has been complicated by an autoimmune skin rash that is being treated with prednisone.  She also has diabetes, which has been poorly controlled since going on oral steroids.  Past Ocular Hx:  severe glaucoma OU, CE/IOL c/b "blood clots" s/p AC washout OD in March/April at [facility] in [place]. Note from her provider reports her VA was 6/18 OD prior to cataract surgery.   Ocular Meds:   Tim/brim/dorz BID OU Diclofenac OD QID Phosphate sodium dexamethasone QHS OD Ciprofloxacin QID OD  Family ocular history: Denies glaucoma, AMD  Past Medical History:   Diagnosis Date   Colon cancer (HCC)    Diabetes mellitus without complication (HCC)    Glaucoma    Goals of care, counseling/discussion 09/01/2020   Hepatitis C    Hepatocellular carcinoma (HCC)    Hypertension    Past Surgical History:  Procedure Laterality Date   COLON RESECTION  2005   In [place] - ?colon cancer   IR IMAGING GUIDED PORT INSERTION  09/18/2020   LAPAROSCOPIC GASTRIC RESECTION N/A 08/09/2020   Procedure: LAPAROSCOPIC EXPLORATION OF ABDOMEN, PERFORATED ULCER REPAIR, LIVER BIOPSY;  Surgeon: Karie Soda, MD;  Location: [facility];  Service: General;  Laterality: N/A;    Prior to Admission Meds: (Not in a hospital admission)   Inpatient Meds: Meds ordered this encounter  Medications   tetracaine (PONTOCAINE) 0.5 % ophthalmic solution 2 drop   fluorescein ophthalmic strip 1 strip   levofloxacin (LEVAQUIN) IVPB 750 mg   lidocaine-EPINEPHrine-tetracaine (LET) topical gel   heparin lock flush 100 unit/mL     Allergies  Allergen Reactions   Bactrim [Sulfamethoxazole-Trimethoprim] Swelling   Clarithromycin Swelling and Rash    Daughter is unsure if Clarithromycin or Amoxicillin caused rash and swelling.  Patient states no allergy to amoxicillan - has taken this recently with no issues. (12/02/21)   Social History   Tobacco Use   Smoking status: Never   Smokeless tobacco: Never  Substance Use Topics   Alcohol use: Not Currently   Family History  Problem Relation Age of Onset   Cancer Father        type unknown, was on the leg   Other Daughter        Acoustic neuroma  Breast cancer Neg Hx    Colon cancer Neg Hx    Esophageal cancer Neg Hx    Rectal cancer Neg Hx    Stomach cancer Neg Hx     ROS: Other than ROS in the HPI, all other systems were negative.  Exam: Temp: 97.8 F (36.6 C) Pulse Rate: 82 BP: 138/79 Resp: 16 SpO2: 98 %  Visual Acuity:  [place]  cc  ph  near   OD  +     LP with projection   OS  +     20/40-     OD OS  Confr Vis  Fields LP projection superiorly but not inferiorly WNL  EOM (Primary) WNL WNL  Conjunctiva - Bulbar injected WNL  Conjunctiva - Palpebral               WNL WNL  Adnexa  Ptosis, deep sulcus Ptosis, deep sulcus  Pupils  No view WNL  Reaction, Direct No view WNL                 Consensual No view none                 RAPD Yes by reverse No  Cornea  Near complete epi defect, no stromal thinning WNL  Anterior Chamber Diffuse fibrin superior 95% of chamber, shallow inferiorly with 1mm hypopyon. 1+ cell WNL  Lens:  No view NS and cortical changes  IOP 22 15  Fundus - Dilated? Yes    Optic Disc - C:D Ratio No view 0.9                     Appearance   cupped                     NF Layer  thinned  Post Seg:  Retina                    Vessels  Exudates along temporal arcade, large DBH superior to disc                  Vitreous   WNL                  Macula  Diminished foveal reflex                  Periphery  Flat and attached       Neuro:  Oriented to person, place, and time:  Yes Psychiatric:  Mood and Affect Appropriate:  Yes  Labs/imaging: . A/P:  77 y.o. female with complicated medical hx including HCC on immunotherapy, severe glaucoma, with recent CE/IOL OD in March, course complicated by ?hyphema requiring washout OD 2 months prior, now with endophthamitis OD. Source unclear, could be exogenous from K ulcer, endogenous from transient bacteremia (liver abscess), or atypical organisms following surgery.  There could also be a uveitic component (sympathetic ophthalmia versus immunotherapy-related). Given relatively acute onset and severity of vision loss, recommend she be urgently evaluated by retina provider.  Guarded prognosis for vision and the eye discussed with patient.  Stop diclofenac to promote corneal healing Continue all other ocular meds Recommend blood cultures to include fungal organisms Consider systemic fluoroquniolone   [name] 07/16/2022, 11:37 PM Ophthalmic  Plastic and Reconstructive Surgery [facility] [phone]

## 2022-07-16 NOTE — Patient Instructions (Signed)
Implanted Port Removal, Care After The following information offers guidance on how to care for yourself after your procedure. Your health care provider may also give you more specific instructions. If you have problems or questions, contact your health care provider. What can I expect after the procedure? After the procedure, it is common to have: Soreness or pain near your incision. Some swelling or bruising near your incision. Follow these instructions at home: Medicines Take over-the-counter and prescription medicines only as told by your health care provider. If you were prescribed an antibiotic medicine, take it as told by your health care provider. Do not stop taking the antibiotic even if you start to feel better. Bathing Do not take baths, swim, or use a hot tub until your health care provider approves. Ask your health care provider if you can take showers. You may only be allowed to take sponge baths. Incision care  Follow instructions from your health care provider about how to take care of your incision. Make sure you: Wash your hands with soap and water for at least 20 seconds before and after you change your bandage (dressing). If soap and water are not available, use hand sanitizer. Change your dressing as told by your health care provider. Keep your dressing dry. Leave stitches (sutures), skin glue, or adhesive strips in place. These skin closures may need to stay in place for 2 weeks or longer. If adhesive strip edges start to loosen and curl up, you may trim the loose edges. Do not remove adhesive strips completely unless your health care provider tells you to do that. Check your incision area every day for signs of infection. Check for: More redness, swelling, or pain. More fluid or blood. Warmth. Pus or a bad smell. Activity Return to your normal activities as told by your health care provider. Ask your health care provider what activities are safe for you. You may have  to avoid lifting. Ask your health care provider how much you can safely lift. Do not do activities that involve lifting your arms over your head. Driving  If you were given a sedative during the procedure, it can affect you for several hours. Do not drive or operate machinery until your health care provider says that it is safe. If you did not receive a sedative, ask your health care provider when it is safe to drive. General instructions Do not use any products that contain nicotine or tobacco. These products include cigarettes, chewing tobacco, and vaping devices, such as e-cigarettes. These can delay healing after surgery. If you need help quitting, ask your health care provider. Keep all follow-up visits. This is important. Contact a health care provider if: You have a fever or chills. You have more redness, swelling, or pain around your incision. You have more fluid or blood coming from your incision. Your incision feels warm to the touch. You have pus or a bad smell coming from your incision. You have pain that is not relieved by your pain medicine. Get help right away if: You have chest pain. You have difficulty breathing. These symptoms may be an emergency. Get help right away. Call 911. Do not wait to see if the symptoms will go away. Do not drive yourself to the hospital. Summary After the procedure, it is common to have pain, soreness, swelling, or bruising near your incision. If you were prescribed an antibiotic medicine, take it as told by your health care provider. Do not stop taking the antibiotic even if you  start to feel better. If you were given a sedative during the procedure, it can affect you for several hours. Do not drive or operate machinery until your health care provider says that it is safe. Return to your normal activities as told by your health care provider. Ask your health care provider what activities are safe for you. This information is not intended to  replace advice given to you by your health care provider. Make sure you discuss any questions you have with your health care provider. Document Revised: 08/05/2020 Document Reviewed: 08/05/2020 Elsevier Patient Education  2024 [employer].

## 2022-07-16 NOTE — ED Triage Notes (Signed)
Pt is here due to pain and inability to see in right eye for the past 2 days.  Pt is also reporting flashes of light across right eye for e days and drainage from right eye.  Pt had cataract surgery in [place] in February.  She reports that she had a slow recovery due to the glacoma, she had some difficutly with "seeing images and reading letters" and she did recover and was doing well and she traveled back to [place] 5/1 and has been well until 2 days ago.

## 2022-07-16 NOTE — Patient Instructions (Signed)
[facility]  Discharge Instructions: Thank you for choosing [facility] to provide your oncology and hematology care.   If you have a lab appointment with the Cancer Center, please go directly to the Cancer Center and check in at the registration area.  Wear comfortable clothing and clothing appropriate for easy access to any Portacath or PICC line.   We strive to give you quality time with your provider. You may need to reschedule your appointment if you arrive late (15 or more minutes).  Arriving late affects you and other patients whose appointments are after yours.  Also, if you miss three or more appointments without notifying the office, you may be dismissed from the clinic at the provider's discretion.      For prescription refill requests, have your pharmacy contact our office and allow 72 hours for refills to be completed.    Today you received the following chemotherapy and/or immunotherapy agents opdivo       To help prevent nausea and vomiting after your treatment, we encourage you to take your nausea medication as directed.  BELOW ARE SYMPTOMS THAT SHOULD BE REPORTED IMMEDIATELY: *FEVER GREATER THAN 100.4 F (38 C) OR HIGHER *CHILLS OR SWEATING *NAUSEA AND VOMITING THAT IS NOT CONTROLLED WITH YOUR NAUSEA MEDICATION *UNUSUAL SHORTNESS OF BREATH *UNUSUAL BRUISING OR BLEEDING *URINARY PROBLEMS (pain or burning when urinating, or frequent urination) *BOWEL PROBLEMS (unusual diarrhea, constipation, pain near the anus) TENDERNESS IN MOUTH AND THROAT WITH OR WITHOUT PRESENCE OF ULCERS (sore throat, sores in mouth, or a toothache) UNUSUAL RASH, SWELLING OR PAIN  UNUSUAL VAGINAL DISCHARGE OR ITCHING   Items with * indicate a potential emergency and should be followed up as soon as possible or go to the Emergency Department if any problems should occur.  Please show the CHEMOTHERAPY ALERT CARD or IMMUNOTHERAPY ALERT CARD at check-in  to the Emergency Department and triage nurse. Should you have questions after your visit or need to cancel or reschedule your appointment, please contact [facility] [place]  [phone] and follow the prompts.  Office hours are 8:00 a.m. to 4:30 p.m. Monday - Friday. Please note that voicemails left after 4:00 p.m. may not be returned until the following business day.  We are closed weekends and major holidays. You have access to a nurse at all times for urgent questions. Please call the main number to the clinic [phone] and follow the prompts.  For any non-urgent questions, you may also contact your provider using MyChart. We now offer e-Visits for anyone 15 and older to request care online for non-urgent symptoms. For details visit [email or web address].   Also download the MyChart app! Go to the app store, search "MyChart", open the app, select [facility], and log in with your MyChart username and password.

## 2022-07-16 NOTE — ED Notes (Signed)
Pt requesting numbing ointment application before port access, notified MD.

## 2022-07-16 NOTE — ED Notes (Signed)
Pals line repaged. RNs are working on case

## 2022-07-16 NOTE — ED Provider Notes (Signed)
[name] EMERGENCY DEPARTMENT AT [facility] Provider Note   CSN: [id number] Arrival date & time: 07/16/22  1418     History  Chief Complaint  Patient presents with   [name] [name] is a 77 y.o. female with history of cataracts presenting to the emergency department with complaint of right eye pain and vision loss.  Patient reports that she had surgery on her eye for cataracts and complications of the surgery in [place] in March and April of this year.  Per the paperwork provided by the patient and her family, from [facility], the patient underwent cataract surgery in March 2024, it was complicated by "blood clots" which was managed with "anterior chamber washout".  In April 2024, day 1 postop anterior chamber washout, patient's visual acuity worsened to hand movement and there was noted to be "fibrinous anterior chamber activity in the right eye" which was being managed with triamcinolone steroid drops and ciprofloxacin eyedrops and ointment.  [place].  She continued to have blurred vision in the right eye, but did have some visual function, until approximately 3 to 4 days ago, when she began to see flashes and floaters across her right eye, and has lost vision except of bright light in the right eye.  She also reports soreness of the right eye.  Her left eye is unaffected.  She reports some drainage from the right eye in the past 2 days as well.  She continues taking ciprofloxacin drops, ketorolac drops,  Ran out of bromidol drops.  She does not have an ophthalmologist here in the states.  HPI     Home Medications Prior to Admission medications   Medication Sig Start Date End Date Taking? Authorizing Provider  brimonidine (ALPHAGAN) 0.2 % ophthalmic solution 1 drop 2 (two) times daily. 09/10/20   [provider]  clobetasol ointment (TEMOVATE) 0.05 % Apply twice a day on affected  areas Patient not taking: Reported on 06/29/2022 02/02/22     gabapentin (NEURONTIN) 300 MG capsule Take 1 capsule (300 mg total) by mouth once nightly at bedtime. Patient not taking: Reported on 06/25/2022 07/28/21   Hoy Register, MD  glucose blood (TRUE METRIX BLOOD GLUCOSE TEST) test strip Use as instructed 01/13/21   Marcine Matar, MD  insulin glargine (LANTUS SOLOSTAR) 100 UNIT/ML Solostar Pen Inject 12 Units into the skin daily. 01/18/22   [name], Konrad Dolores, MD  insulin lispro (HUMALOG KWIKPEN) 200 UNIT/ML KwikPen Max daily dose 100 units. Patient taking differently: 07/16/2022 10 units TID with meals. 02/12/22   Marcine Matar, MD  insulin lispro (HUMALOG) 100 UNIT/ML injection SSI with meals: 200-250 3 units, 251-300 6 units, 301-350 9 units, 351-400 12 units, 401-450 15 units, > 451 18 units under the skin as directed 08/24/21   [provider]  Insulin Pen Needle 32G X 4 MM MISC Use with insulins daily. 01/18/22   [name], Konrad Dolores, MD  lenvatinib 8 mg daily dose (LENVIMA) 2 x 4 MG capsule Take 2 capsules (8 mg total) by mouth daily. Patient not taking: Reported on 06/29/2022 02/17/22   [name], MD  lidocaine-prilocaine (EMLA) cream Apply 1 Application topically as needed. 06/18/22 07/18/22  [name], MD  losartan (COZAAR) 100 MG tablet Take 1 tablet (100 mg total) by mouth at bedtime. 02/12/22   Marcine Matar, MD  losartan (COZAAR) 25 MG tablet Take 25 mg by mouth daily.  09/03/21   [provider]  Magnesium 400 MG TABS Take 1 tablet by mouth daily at 6 (six) AM. Patient not taking: Reported on 07/16/2022    [provider]  NIFEdipine (PROCARDIA-XL/NIFEDICAL-XL) 30 MG 24 hr tablet Take 1 tablet (30 mg total) by mouth in the morning and at bedtime. 07/14/22   Marcine Matar, MD  pantoprazole (PROTONIX) 40 MG tablet Take 1 tablet (40 mg total) by mouth 2 (two) times daily. 02/12/22   Marcine Matar, MD  polyethylene  glycol powder (GLYCOLAX/MIRALAX) 17 GM/SCOOP powder Take by mouth daily as needed. 04/24/21   [provider]  predniSONE (DELTASONE) 5 MG tablet Take 3.5 tablets (17.5 mg total) by mouth daily for 30 days, THEN 3 tablets (15 mg total) daily for 30 days, THEN 2.5 tablets (12.5 mg total) daily for 30 days, THEN 2.5 tablets (12.5 mg total) daily for 30 days, THEN 2 tablets (10 mg total) daily for 30 days. 02/02/22     silver sulfADIAZINE (SILVADENE) 1 % cream Apply to the open, ulcerated areas of the skin once daily as needed. Patient not taking: Reported on 07/16/2022 02/02/22     triamcinolone ointment (KENALOG) 0.1 % Apply 1 Application topically 2 (two) times daily. 12/03/21   Shelby Mattocks, DO  Vitamin D, Cholecalciferol, 10 MCG (400 UNIT) TABS Take 1 tablet by mouth daily at 6 (six) AM. Patient not taking: Reported on 07/16/2022    [provider]      Allergies    Bactrim [sulfamethoxazole-trimethoprim] and Clarithromycin    Review of Systems   Review of Systems  Physical Exam Updated Vital Signs BP (!) 147/81   Pulse 79   Temp (!) 97.5 F (36.4 C)   Resp 17   SpO2 96%  Physical Exam Constitutional:      General: She is not in acute distress. HENT:     Head: Normocephalic and atraumatic.  Eyes:     Comments: Vision 20/200 right eye (light only), vision 20/40 left eye IOP 22 mmhg right eye, 20 mmhg left eye External eye exam demonstrating conjunctival injection, mild watery drainage, small hypopyon noted in the anterior chamber and cloudy white discoloration of the anterior chamber of the right eye External left ocular exam unremarkable. Fluorescein staining shows diffuse uptake of fluorescein in the right anterior chamber without evidence of active extravasation  Cardiovascular:     Rate and Rhythm: Normal rate and regular rhythm.  Pulmonary:     Effort: Pulmonary effort is normal. No respiratory distress.  Abdominal:     General: There is no distension.      Tenderness: There is no abdominal tenderness.  Skin:    General: Skin is warm and dry.  Neurological:     General: No focal deficit present.     Mental Status: She is alert. Mental status is at baseline.  Psychiatric:        Mood and Affect: Mood normal.        Behavior: Behavior normal.     ED Results / Procedures / Treatments   Labs (all labs ordered are listed, but only abnormal results are displayed) Labs Reviewed  BASIC METABOLIC PANEL - Abnormal; Notable for the following components:      Result Value   Glucose, Bld 109 (*)    All other components within normal limits  CULTURE, BLOOD (ROUTINE X 2)  CULTURE, BLOOD (ROUTINE X 2)  CBC    EKG None  Radiology No results found.  Procedures  Procedures    Medications Ordered in ED Medications  levofloxacin (LEVAQUIN) IVPB 750 mg ( Intravenous Infusion Verify 07/16/22 2034)  heparin lock flush 100 unit/mL (has no administration in time range)  tetracaine (PONTOCAINE) 0.5 % ophthalmic solution 2 drop (2 drops Right Eye Given by Other 07/16/22 1909)  fluorescein ophthalmic strip 1 strip (1 strip Right Eye Given by Other 07/16/22 1909)  lidocaine-EPINEPHrine-tetracaine (LET) topical gel (3 mLs Topical Given 07/16/22 1924)    ED Course/ Medical Decision Making/ A&P Clinical Course as of 07/16/22 2106  Fri Jul 16, 2022  1735 Dr  Serina Cowper to see patient. [MT]  1809 DR Darcel Bayley recommending urgent transfer to [facility] for retinal specialist, concern for endophthalmitis, we will attempt to reach out to [facility], Diplomatic Services operational officer notified now.  Per ophthalmology recommendation, we can give 1 dose IV fluoroquinolone, which may have some efficacy, although patient likely needing intraocular antibiotics or operation. [MT]  1956 Awaiting callback from [facility] [MT]  2018 I spoke to Dr Ina Homes from ophthalmology at [facility].  He is accepted the patient for transfer, ED to ED to [facility].  The patient is completing her  antibiotics now, then will travel by private vehicle with her daughter driving her directly to [facility] ED in [facility], which would be the fastest most affordable option for them to reach the facility. [MT]  2041 Nurse provider advised to de-access chest port prior to transport [MT]    Clinical Course User Index [MT] Daysie Helf, Kermit Balo, MD                             Medical Decision Making Amount and/or Complexity of Data Reviewed Labs: ordered.  Risk Prescription drug management.   Differential diagnose include endophthalmitis versus hypopyon versus other infection  Difficult to visualize the retina on exam given the obscuration of her anterior chamber  I do not see evidence of globe rupture or positive Seidel sign.  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  I consulted with ophthalmology, see ED course, recommended blood work, blood cultures, round of IV antibiotics with a fluoroquinolone here, and emergent transfer to [facility] or other facility with retinal specialist.  I subsequently spoke to [facility] facility and I am arranging for transport at this time.  The patient has remained in stable condition in the ED.  Her daughter was present with entire history and exam and will be transporting her to [facility] ED.        Final Clinical Impression(s) / ED Diagnoses Final diagnoses:  Right endophthalmia  Vision loss of right eye    Rx / DC Orders ED Discharge Orders     None         Terald Sleeper, MD 07/16/22 2106

## 2022-07-19 ENCOUNTER — Encounter: Payer: Self-pay | Admitting: Internal Medicine

## 2022-07-19 ENCOUNTER — Telehealth: Payer: Self-pay

## 2022-07-19 LAB — CULTURE, BLOOD (ROUTINE X 2): Special Requests: ADEQUATE

## 2022-07-19 NOTE — Telephone Encounter (Signed)
Advised via MyChart.

## 2022-07-19 NOTE — Telephone Encounter (Signed)
-----   Message from [name], MD sent at 07/18/2022  8:30 PM EDT ----- Call - the cancer is still shrinking!!!   [place] job!!  Cindee Lame

## 2022-07-20 ENCOUNTER — Other Ambulatory Visit: Payer: Self-pay

## 2022-07-20 LAB — AFP TUMOR MARKER: AFP, Serum, Tumor Marker: 7.1 ng/mL (ref 0.0–9.2)

## 2022-07-20 LAB — CULTURE, BLOOD (ROUTINE X 2): Culture: NO GROWTH

## 2022-07-21 ENCOUNTER — Telehealth: Payer: Self-pay | Admitting: Internal Medicine

## 2022-07-21 LAB — CULTURE, BLOOD (ROUTINE X 2)

## 2022-07-21 NOTE — Telephone Encounter (Signed)
Patient has been sent a Mychart message informing that letter will be ready for pick up on 07/22/2022.

## 2022-07-21 NOTE — Telephone Encounter (Signed)
Letter placed on desk for signature.

## 2022-07-23 ENCOUNTER — Other Ambulatory Visit: Payer: Self-pay

## 2022-07-23 ENCOUNTER — Encounter: Payer: Self-pay | Admitting: Hematology & Oncology

## 2022-07-23 MED ORDER — FLUCONAZOLE 150 MG PO TABS
150.0000 mg | ORAL_TABLET | Freq: Every day | ORAL | 0 refills | Status: DC
  Filled 2022-07-23: qty 30, 30d supply, fill #0

## 2022-07-26 ENCOUNTER — Other Ambulatory Visit: Payer: Self-pay

## 2022-07-28 ENCOUNTER — Encounter: Payer: Self-pay | Admitting: Hematology & Oncology

## 2022-07-28 ENCOUNTER — Other Ambulatory Visit: Payer: Self-pay

## 2022-07-28 MED ORDER — ERYTHROMYCIN 5 MG/GM OP OINT
1.0000 | TOPICAL_OINTMENT | Freq: Four times a day (QID) | OPHTHALMIC | 5 refills | Status: DC
  Filled 2022-07-28: qty 3.5, 7d supply, fill #0
  Filled 2022-08-13: qty 3.5, 7d supply, fill #1
  Filled 2022-09-20 – 2022-09-24 (×2): qty 3.5, 7d supply, fill #2
  Filled 2022-10-03: qty 3.5, 7d supply, fill #3
  Filled 2022-10-28: qty 3.5, 7d supply, fill #4

## 2022-07-28 MED ORDER — BRIMONIDINE TARTRATE 0.2 % OP SOLN
1.0000 [drp] | Freq: Two times a day (BID) | OPHTHALMIC | 11 refills | Status: DC
  Filled 2022-07-28: qty 5, 25d supply, fill #0
  Filled 2022-08-13: qty 5, 25d supply, fill #1

## 2022-07-28 MED ORDER — MOXIFLOXACIN HCL 0.5 % OP SOLN
1.0000 [drp] | Freq: Four times a day (QID) | OPHTHALMIC | 3 refills | Status: DC
  Filled 2022-07-28: qty 3, 15d supply, fill #0

## 2022-07-28 MED ORDER — PREDNISOLONE ACETATE 1 % OP SUSP
1.0000 [drp] | OPHTHALMIC | 1 refills | Status: DC
  Filled 2022-07-28: qty 15, 25d supply, fill #0

## 2022-07-29 ENCOUNTER — Other Ambulatory Visit: Payer: Self-pay

## 2022-07-30 ENCOUNTER — Other Ambulatory Visit: Payer: Self-pay

## 2022-08-03 ENCOUNTER — Encounter: Payer: Self-pay | Admitting: Hematology & Oncology

## 2022-08-03 ENCOUNTER — Other Ambulatory Visit: Payer: Self-pay

## 2022-08-12 ENCOUNTER — Encounter: Payer: Self-pay | Admitting: Hematology & Oncology

## 2022-08-12 ENCOUNTER — Other Ambulatory Visit: Payer: Self-pay

## 2022-08-12 MED ORDER — VORICONAZOLE 200 MG PO TABS
ORAL_TABLET | ORAL | 0 refills | Status: DC
  Filled 2022-08-12: qty 60, 30d supply, fill #0

## 2022-08-13 ENCOUNTER — Inpatient Hospital Stay: Payer: No Typology Code available for payment source

## 2022-08-13 ENCOUNTER — Inpatient Hospital Stay: Payer: No Typology Code available for payment source | Attending: Hematology & Oncology

## 2022-08-13 ENCOUNTER — Encounter: Payer: Self-pay | Admitting: Hematology & Oncology

## 2022-08-13 ENCOUNTER — Inpatient Hospital Stay (HOSPITAL_BASED_OUTPATIENT_CLINIC_OR_DEPARTMENT_OTHER): Payer: No Typology Code available for payment source | Admitting: Hematology & Oncology

## 2022-08-13 ENCOUNTER — Other Ambulatory Visit: Payer: Self-pay

## 2022-08-13 VITALS — BP 113/55 | HR 96 | Temp 98.5°F | Resp 18 | Wt 155.8 lb

## 2022-08-13 VITALS — BP 120/52 | HR 87 | Resp 18

## 2022-08-13 DIAGNOSIS — C220 Liver cell carcinoma: Secondary | ICD-10-CM

## 2022-08-13 DIAGNOSIS — B1920 Unspecified viral hepatitis C without hepatic coma: Secondary | ICD-10-CM | POA: Insufficient documentation

## 2022-08-13 DIAGNOSIS — Z7952 Long term (current) use of systemic steroids: Secondary | ICD-10-CM | POA: Insufficient documentation

## 2022-08-13 DIAGNOSIS — Z5112 Encounter for antineoplastic immunotherapy: Secondary | ICD-10-CM | POA: Insufficient documentation

## 2022-08-13 DIAGNOSIS — R21 Rash and other nonspecific skin eruption: Secondary | ICD-10-CM | POA: Insufficient documentation

## 2022-08-13 LAB — CMP (CANCER CENTER ONLY)
ALT: 23 U/L (ref 0–44)
AST: 16 U/L (ref 15–41)
Albumin: 4.2 g/dL (ref 3.5–5.0)
Alkaline Phosphatase: 77 U/L (ref 38–126)
Anion gap: 7 (ref 5–15)
BUN: 26 mg/dL — ABNORMAL HIGH (ref 8–23)
CO2: 23 mmol/L (ref 22–32)
Calcium: 10.6 mg/dL — ABNORMAL HIGH (ref 8.9–10.3)
Chloride: 104 mmol/L (ref 98–111)
Creatinine: 1.11 mg/dL — ABNORMAL HIGH (ref 0.44–1.00)
GFR, Estimated: 52 mL/min — ABNORMAL LOW (ref >60)
Glucose, Bld: 191 mg/dL — ABNORMAL HIGH (ref 70–99)
Potassium: 3.7 mmol/L (ref 3.5–5.1)
Sodium: 134 mmol/L — ABNORMAL LOW (ref 135–145)
Total Bilirubin: 0.5 mg/dL (ref 0.3–1.2)
Total Protein: 7.4 g/dL (ref 6.5–8.1)

## 2022-08-13 LAB — CBC WITH DIFFERENTIAL (CANCER CENTER ONLY)
Abs Immature Granulocytes: 0.02 10*3/uL (ref 0.00–0.07)
Basophils Absolute: 0 10*3/uL (ref 0.0–0.1)
Basophils Relative: 1 %
Eosinophils Absolute: 0.1 10*3/uL (ref 0.0–0.5)
Eosinophils Relative: 1 %
HCT: 39.3 % (ref 36.0–46.0)
Hemoglobin: 13.2 g/dL (ref 12.0–15.0)
Immature Granulocytes: 0 %
Lymphocytes Relative: 24 %
Lymphs Abs: 1.5 10*3/uL (ref 0.7–4.0)
MCH: 30.1 pg (ref 26.0–34.0)
MCHC: 33.6 g/dL (ref 30.0–36.0)
MCV: 89.7 fL (ref 80.0–100.0)
Monocytes Absolute: 0.5 10*3/uL (ref 0.1–1.0)
Monocytes Relative: 8 %
Neutro Abs: 4.1 10*3/uL (ref 1.7–7.7)
Neutrophils Relative %: 66 %
Platelet Count: 183 10*3/uL (ref 150–400)
RBC: 4.38 MIL/uL (ref 3.87–5.11)
RDW: 14.5 % (ref 11.5–15.5)
WBC Count: 6.2 10*3/uL (ref 4.0–10.5)
nRBC: 0 % (ref 0.0–0.2)

## 2022-08-13 LAB — LACTATE DEHYDROGENASE: LDH: 170 U/L (ref 98–192)

## 2022-08-13 MED ORDER — SODIUM CHLORIDE 0.9% FLUSH: 3.0000 mL | INTRAVENOUS | Status: DC | PRN

## 2022-08-13 MED ORDER — SODIUM CHLORIDE 0.9 % IV SOLN
480.0000 mg | Freq: Once | INTRAVENOUS | Status: AC
  Administered 2022-08-13: 480 mg via INTRAVENOUS
  Filled 2022-08-13: qty 48

## 2022-08-13 MED ORDER — SODIUM CHLORIDE 0.9 % IV SOLN: Freq: Once | INTRAVENOUS | Status: AC

## 2022-08-13 MED ORDER — HEPARIN SOD (PORK) LOCK FLUSH 100 UNIT/ML IV SOLN
500.0000 [IU] | Freq: Once | INTRAVENOUS | Status: AC | PRN
  Administered 2022-08-13: 500 [IU]

## 2022-08-13 MED ORDER — SODIUM CHLORIDE 0.9% FLUSH
10.0000 mL | INTRAVENOUS | Status: DC | PRN
  Administered 2022-08-13: 10 mL

## 2022-08-13 NOTE — Progress Notes (Signed)
All Hematology and Oncology Follow Up Visit  Caylie Poage [name] [id number] 03-10-1945 77 y.o. 08/13/2022   Principle Diagnosis:  Hepatocellular carcinoma-multifocal Hepatitis C   Current Therapy:        Status post cycle 1 of nivolumab/ipilimumab -- d/c on 10/13/2020 due to hepatic toxicity Nivolumab 480 mg IV every 4 weeks - s/p cycle #20 -- started 11/21/2020 --changed to 480 mg on 08/2021 Nexavar 200 mg po BID -- to be taken while in [place] -- start on 02/26/2022 -- d/c on 06/16/2022   Interim History:  [name] [name] is here today for follow-up.  Her main problem right now is the right eye.  Apparently she developed a fungal infection in the right eye.  This happened after she had cataract surgery.  She apparently has had another surgery.  She is going to be put on voriconazole by Infectious Disease.  Hopefully, this will eradicate this fungal infection.  Hopefully, she will not lose the eye.  I am sure that the diabetes probably has something to do with it.  Her blood sugars have been on the high side.  She has done incredibly well with respect to her parasellar carcinoma therapy.  She continues on nivolumab.  Her last alpha-fetoprotein was 7.1.  She had an MRI of the liver on 07/14/2022.  This showed nice continued decrease in the liver disease.  She has had no problems with bowels or bladder.  There is been no obvious bleeding.  She has had no leg swelling.  She is on a very low-dose of prednisone (2.5 mg p.o. daily) for the skin rash.  Currently, I would say that her performance status is probably ECOG 1.   Medications:  Allergies as of 08/13/2022       Reactions   Bactrim [sulfamethoxazole-trimethoprim] Swelling   Clarithromycin Swelling, Rash   Daughter is unsure if Clarithromycin or Amoxicillin caused rash and swelling. Patient states no allergy to amoxicillan - has taken this recently with no issues. (12/02/21)        Medication List         Accurate as of August 13, 2022  8:21 AM. If you have any questions, ask your nurse or doctor.          Basaglar KwikPen 100 UNIT/ML Inject 12 Units into the skin daily.   brimonidine 0.2 % ophthalmic solution Commonly known as: ALPHAGAN 1 drop 2 (two) times daily.   brimonidine 0.2 % ophthalmic solution Commonly known as: ALPHAGAN Place 1 drop into both eyes 2 (two) times daily.   clobetasol ointment 0.05 % Commonly known as: TEMOVATE Apply twice a day on affected areas   erythromycin ophthalmic ointment Place  0.5 inch ribbon to the right eye 4 (four) times daily.   gabapentin 300 MG capsule Commonly known as: NEURONTIN Take 1 capsule (300 mg total) by mouth once nightly at bedtime.   insulin lispro 100 UNIT/ML injection Commonly known as: HUMALOG SSI with meals: 200-250 3 units, 251-300 6 units, 301-350 9 units, 351-400 12 units, 401-450 15 units, > 451 18 units under the skin as directed What changed: Another medication with the same name was changed. Make sure you understand how and when to take each.   HumaLOG KwikPen 200 UNIT/ML KwikPen Generic drug: insulin lispro Max daily dose 100 units. What changed: additional instructions   lenvatinib 8 mg daily dose 2 x 4 MG capsule Commonly known as: LENVIMA Take 2 capsules (8 mg total) by mouth daily.  losartan 25 MG tablet Commonly known as: COZAAR Take 25 mg by mouth daily.   losartan 100 MG tablet Commonly known as: COZAAR Take 1 tablet (100 mg total) by mouth at bedtime.   Magnesium 400 MG Tabs Take 1 tablet by mouth daily at 6 (six) AM.   moxifloxacin 0.5 % ophthalmic solution Commonly known as: VIGAMOX Place 1 drop into the right eye 4 (four) times daily.   NIFEdipine 30 MG 24 hr tablet Commonly known as: PROCARDIA-XL/NIFEDICAL-XL Take 1 tablet (30 mg total) by mouth in the morning and at bedtime.   pantoprazole 40 MG tablet Commonly known as: Protonix Take 1 tablet (40 mg total) by mouth 2  (two) times daily.   polyethylene glycol powder 17 GM/SCOOP powder Commonly known as: GLYCOLAX/MIRALAX Take by mouth daily as needed.   prednisoLONE acetate 1 % ophthalmic suspension Commonly known as: PRED FORTE Place 1 drop into the right eye every 2 (two) hours.   predniSONE 5 MG tablet Commonly known as: DELTASONE Take 3.5 tablets (17.5 mg total) by mouth daily for 30 days, THEN 3 tablets (15 mg total) daily for 30 days, THEN 2.5 tablets (12.5 mg total) daily for 30 days, THEN 2.5 tablets (12.5 mg total) daily for 30 days, THEN 2 tablets (10 mg total) daily for 30 days.   SSD 1 % cream Generic drug: silver sulfADIAZINE Apply to the open, ulcerated areas of the skin once daily as needed.   TechLite Pen Needles 32G X 4 MM Misc Generic drug: Insulin Pen Needle Use with insulins daily.   triamcinolone ointment 0.1 % Commonly known as: KENALOG Apply 1 Application topically 2 (two) times daily.   True Metrix Blood Glucose Test test strip Generic drug: glucose blood Use as instructed   Vitamin D (Cholecalciferol) 10 MCG (400 UNIT) Tabs Take 1 tablet by mouth daily at 6 (six) AM.   voriconazole 200 MG tablet Commonly known as: VFEND Take 1 tablet (200 mg total) by mouth 2 (two) times a day. Take  400 mg twice daily for 2 doses ( 1st day only), then continue 200 mg twice daily        Allergies:  Allergies  Allergen Reactions   Bactrim [Sulfamethoxazole-Trimethoprim] Swelling   Clarithromycin Swelling and Rash    Daughter is unsure if Clarithromycin or Amoxicillin caused rash and swelling.  Patient states no allergy to amoxicillan - has taken this recently with no issues. (12/02/21)    Past Medical History, Surgical history, Social history, and Family History were reviewed and updated.  Review of Systems: Review of Systems  Constitutional: Negative.   HENT: Negative.    Eyes: Negative.   Respiratory: Negative.    Cardiovascular: Negative.   Gastrointestinal:  Negative.   Genitourinary: Negative.   Musculoskeletal: Negative.   Skin: Negative.   Neurological:  Positive for tingling.  Endo/Heme/Allergies: Negative.   Psychiatric/Behavioral: Negative.       Physical Exam:  weight is 155 lb 12 oz (70.6 kg). Her oral temperature is 98.5 F (36.9 C). Her blood pressure is 113/55 (abnormal) and her pulse is 96. Her respiration is 18 and oxygen saturation is 99%.   Wt Readings from Last 3 Encounters:  08/13/22 155 lb 12 oz (70.6 kg)  07/16/22 157 lb 1.3 oz (71.3 kg)  06/29/22 157 lb (71.2 kg)    Physical Exam Vitals reviewed.  HENT:     Head: Normocephalic and atraumatic.  Eyes:     Pupils: Pupils are equal, round, and reactive to light.  Comments: Her right eyes closed.  Left eye shows good extraocular muscle movement.  Pupils react.  Cardiovascular:     Rate and Rhythm: Normal rate and regular rhythm.     Heart sounds: Normal heart sounds.  Pulmonary:     Effort: Pulmonary effort is normal.     Breath sounds: Normal breath sounds.  Abdominal:     General: Bowel sounds are normal.     Palpations: Abdomen is soft.     Comments: Abdominal exam shows a well-healed laparotomy scar.  She has no fluid wave.  There is no guarding or rebound tenderness to palpation.  There is no palpable liver or spleen tip.  Musculoskeletal:        General: No tenderness or deformity. Normal range of motion.     Cervical back: Normal range of motion.  Lymphadenopathy:     Cervical: No cervical adenopathy.  Skin:    General: Skin is warm and dry.     Findings: No erythema or rash.     Comments: Skin exam does show a extensive but somewhat healing papular rash on her lower legs.  This is below the knees.  She has some dressing on the foot on the right foot.  She has no open wounds.  There is no blisters.  Neurological:     Mental Status: She is alert and oriented to person, place, and time.  Psychiatric:        Behavior: Behavior normal.        Thought  Content: Thought content normal.        Judgment: Judgment normal.       Lab Results  Component Value Date   WBC 5.8 07/16/2022   HGB 13.4 07/16/2022   HCT 41.2 07/16/2022   MCV 90.0 07/16/2022   PLT 187 07/16/2022   Lab Results  Component Value Date   FERRITIN 603 (H) 10/02/2020   IRON 31 (L) 10/02/2020   TIBC 241 10/02/2020   UIBC 211 10/02/2020   IRONPCTSAT 13 (L) 10/02/2020   Lab Results  Component Value Date   RBC 4.58 07/16/2022   No results found for: "KPAFRELGTCHN", "LAMBDASER", "KAPLAMBRATIO" No results found for: "IGGSERUM", "IGA", "IGMSERUM" No results found for: "TOTALPROTELP", "ALBUMINELP", "A1GS", "A2GS", "BETS", "BETA2SER", "GAMS", "MSPIKE", "SPEI"   Chemistry      Component Value Date/Time   NA 137 07/16/2022 1956   NA 140 01/22/2022 0849   K 3.7 07/16/2022 1956   CL 108 07/16/2022 1956   CO2 22 07/16/2022 1956   BUN 16 07/16/2022 1956   BUN 11 01/22/2022 0849   CREATININE 0.75 07/16/2022 1956   CREATININE 0.70 07/16/2022 0815   CREATININE 0.58 (L) 08/20/2021 1541      Component Value Date/Time   CALCIUM 10.1 07/16/2022 1956   ALKPHOS 88 07/16/2022 0815   AST 18 07/16/2022 0815   ALT 22 07/16/2022 0815   BILITOT 0.5 07/16/2022 0815       Impression and Plan: Ms. Twyla Vasko is a very pleasant 77 yo female from [place] with hepatocellular carcinoma and Hepatitis C.  She actually has had treatment for the Hepatitis C.  I am sure that this is part of the reason why she has done so well with response.  We have her on the nivolumab.  We do not have to make any changes.  I see no interference from her treatment with respect to her eye problem.  If she needs any additional ophthalmologic procedure, I do not see a problem  to her having this.  Hopefully, the Vfend will help with this fungal infection.  We will have to follow her back up in 4 weeks.    [name], MD 6/28/20248:21 AM

## 2022-08-13 NOTE — Patient Instructions (Signed)
[place] [facility]  Discharge Instructions: Thank you for choosing [facility] to provide your oncology and hematology care.   If you have a lab appointment with the Cancer Center, please go directly to the Cancer Center and check in at the registration area.  Wear comfortable clothing and clothing appropriate for easy access to any Portacath or PICC line.   We strive to give you quality time with your provider. You may need to reschedule your appointment if you arrive late (15 or more minutes).  Arriving late affects you and other patients whose appointments are after yours.  Also, if you miss three or more appointments without notifying the office, you may be dismissed from the clinic at the provider's discretion.      For prescription refill requests, have your pharmacy contact our office and allow 72 hours for refills to be completed.    Today you received the following chemotherapy and/or immunotherapy agents Opdivo.      To help prevent nausea and vomiting after your treatment, we encourage you to take your nausea medication as directed.  BELOW ARE SYMPTOMS THAT SHOULD BE REPORTED IMMEDIATELY: *FEVER GREATER THAN 100.4 F (38 C) OR HIGHER *CHILLS OR SWEATING *NAUSEA AND VOMITING THAT IS NOT CONTROLLED WITH YOUR NAUSEA MEDICATION *UNUSUAL SHORTNESS OF BREATH *UNUSUAL BRUISING OR BLEEDING *URINARY PROBLEMS (pain or burning when urinating, or frequent urination) *BOWEL PROBLEMS (unusual diarrhea, constipation, pain near the anus) TENDERNESS IN MOUTH AND THROAT WITH OR WITHOUT PRESENCE OF ULCERS (sore throat, sores in mouth, or a toothache) UNUSUAL RASH, SWELLING OR PAIN  UNUSUAL VAGINAL DISCHARGE OR ITCHING   Items with * indicate a potential emergency and should be followed up as soon as possible or go to the Emergency Department if any problems should occur.  Please show the CHEMOTHERAPY ALERT CARD or IMMUNOTHERAPY ALERT CARD at check-in  to the Emergency Department and triage nurse. Should you have questions after your visit or need to cancel or reschedule your appointment, please contact [place] [facility]  [phone] and follow the prompts.  Office hours are 8:00 a.m. to 4:30 p.m. Monday - Friday. Please note that voicemails left after 4:00 p.m. may not be returned until the following business day.  We are closed weekends and major holidays. You have access to a nurse at all times for urgent questions. Please call the main number to the clinic [phone] and follow the prompts.  For any non-urgent questions, you may also contact your provider using MyChart. We now offer e-Visits for anyone 18 and older to request care online for non-urgent symptoms. For details visit [email or web address].   Also download the MyChart app! Go to the app store, search "MyChart", open the app, select , and log in with your MyChart username and password.   

## 2022-08-13 NOTE — Patient Instructions (Signed)

## 2022-08-14 LAB — AFP TUMOR MARKER: AFP, Serum, Tumor Marker: 6.3 ng/mL (ref 0.0–9.2)

## 2022-08-16 ENCOUNTER — Inpatient Hospital Stay: Payer: No Typology Code available for payment source

## 2022-08-16 ENCOUNTER — Ambulatory Visit: Payer: No Typology Code available for payment source | Admitting: Family

## 2022-08-16 ENCOUNTER — Other Ambulatory Visit: Payer: No Typology Code available for payment source

## 2022-08-16 ENCOUNTER — Ambulatory Visit: Payer: No Typology Code available for payment source

## 2022-08-19 ENCOUNTER — Other Ambulatory Visit: Payer: Self-pay | Admitting: Internal Medicine

## 2022-08-20 ENCOUNTER — Other Ambulatory Visit: Payer: Self-pay

## 2022-08-20 ENCOUNTER — Encounter: Payer: Self-pay | Admitting: Hematology & Oncology

## 2022-08-20 ENCOUNTER — Encounter (HOSPITAL_COMMUNITY): Payer: Self-pay

## 2022-08-20 ENCOUNTER — Inpatient Hospital Stay (HOSPITAL_COMMUNITY)
Admission: EM | Admit: 2022-08-20 | Discharge: 2022-08-22 | DRG: 641 | Disposition: A | Discharge status: Home / Self Care | Payer: MEDICAID | Attending: Internal Medicine | Admitting: Internal Medicine

## 2022-08-20 ENCOUNTER — Emergency Department (HOSPITAL_COMMUNITY): Payer: MEDICAID

## 2022-08-20 DIAGNOSIS — Z85038 Personal history of other malignant neoplasm of large intestine: Secondary | ICD-10-CM | POA: Diagnosis not present

## 2022-08-20 DIAGNOSIS — Z794 Long term (current) use of insulin: Secondary | ICD-10-CM | POA: Diagnosis not present

## 2022-08-20 DIAGNOSIS — Z79899 Other long term (current) drug therapy: Secondary | ICD-10-CM | POA: Diagnosis not present

## 2022-08-20 DIAGNOSIS — H44001 Unspecified purulent endophthalmitis, right eye: Secondary | ICD-10-CM | POA: Diagnosis present

## 2022-08-20 DIAGNOSIS — T402X5A Adverse effect of other opioids, initial encounter: Secondary | ICD-10-CM | POA: Diagnosis present

## 2022-08-20 DIAGNOSIS — E871 Hypo-osmolality and hyponatremia: Secondary | ICD-10-CM | POA: Diagnosis present

## 2022-08-20 DIAGNOSIS — E872 Acidosis, unspecified: Secondary | ICD-10-CM | POA: Diagnosis present

## 2022-08-20 DIAGNOSIS — R7989 Other specified abnormal findings of blood chemistry: Secondary | ICD-10-CM | POA: Diagnosis present

## 2022-08-20 DIAGNOSIS — K5903 Drug induced constipation: Secondary | ICD-10-CM | POA: Diagnosis present

## 2022-08-20 DIAGNOSIS — E119 Type 2 diabetes mellitus without complications: Secondary | ICD-10-CM | POA: Diagnosis present

## 2022-08-20 DIAGNOSIS — T367X5A Adverse effect of antifungal antibiotics, systemically used, initial encounter: Secondary | ICD-10-CM | POA: Diagnosis present

## 2022-08-20 DIAGNOSIS — I10 Essential (primary) hypertension: Secondary | ICD-10-CM | POA: Diagnosis present

## 2022-08-20 DIAGNOSIS — K7460 Unspecified cirrhosis of liver: Secondary | ICD-10-CM | POA: Diagnosis present

## 2022-08-20 DIAGNOSIS — Z881 Allergy status to other antibiotic agents status: Secondary | ICD-10-CM

## 2022-08-20 DIAGNOSIS — K219 Gastro-esophageal reflux disease without esophagitis: Secondary | ICD-10-CM | POA: Diagnosis present

## 2022-08-20 DIAGNOSIS — H5989 Other postprocedural complications and disorders of eye and adnexa, not elsewhere classified: Secondary | ICD-10-CM | POA: Insufficient documentation

## 2022-08-20 DIAGNOSIS — Z8619 Personal history of other infectious and parasitic diseases: Secondary | ICD-10-CM

## 2022-08-20 DIAGNOSIS — H409 Unspecified glaucoma: Secondary | ICD-10-CM | POA: Diagnosis present

## 2022-08-20 DIAGNOSIS — C220 Liver cell carcinoma: Secondary | ICD-10-CM | POA: Diagnosis present

## 2022-08-20 DIAGNOSIS — H44009 Unspecified purulent endophthalmitis, unspecified eye: Secondary | ICD-10-CM | POA: Insufficient documentation

## 2022-08-20 LAB — BASIC METABOLIC PANEL
Anion gap: 10 (ref 5–15)
Anion gap: 11 (ref 5–15)
Anion gap: 6 (ref 5–15)
BUN: 11 mg/dL (ref 8–23)
BUN: 12 mg/dL (ref 8–23)
BUN: 9 mg/dL (ref 8–23)
CO2: 19 mmol/L — ABNORMAL LOW (ref 22–32)
CO2: 19 mmol/L — ABNORMAL LOW (ref 22–32)
CO2: 20 mmol/L — ABNORMAL LOW (ref 22–32)
Calcium: 8.4 mg/dL — ABNORMAL LOW (ref 8.9–10.3)
Calcium: 8.8 mg/dL — ABNORMAL LOW (ref 8.9–10.3)
Calcium: 8.9 mg/dL (ref 8.9–10.3)
Chloride: 91 mmol/L — ABNORMAL LOW (ref 98–111)
Chloride: 92 mmol/L — ABNORMAL LOW (ref 98–111)
Chloride: 93 mmol/L — ABNORMAL LOW (ref 98–111)
Creatinine, Ser: 0.67 mg/dL (ref 0.44–1.00)
Creatinine, Ser: 0.73 mg/dL (ref 0.44–1.00)
Creatinine, Ser: 0.76 mg/dL (ref 0.44–1.00)
GFR, Estimated: >60 mL/min (ref >60)
GFR, Estimated: >60 mL/min (ref >60)
GFR, Estimated: >60 mL/min (ref >60)
Glucose, Bld: 127 mg/dL — ABNORMAL HIGH (ref 70–99)
Glucose, Bld: 76 mg/dL (ref 70–99)
Glucose, Bld: 92 mg/dL (ref 70–99)
Potassium: 3.9 mmol/L (ref 3.5–5.1)
Potassium: 4.1 mmol/L (ref 3.5–5.1)
Potassium: 4.4 mmol/L (ref 3.5–5.1)
Sodium: 118 mmol/L — CL (ref 135–145)
Sodium: 121 mmol/L — ABNORMAL LOW (ref 135–145)
Sodium: 122 mmol/L — ABNORMAL LOW (ref 135–145)

## 2022-08-20 LAB — COMPREHENSIVE METABOLIC PANEL
ALT: 67 U/L — ABNORMAL HIGH (ref 0–44)
AST: 209 U/L — ABNORMAL HIGH (ref 15–41)
Albumin: 3.2 g/dL — ABNORMAL LOW (ref 3.5–5.0)
Alkaline Phosphatase: 85 U/L (ref 38–126)
Anion gap: 8 (ref 5–15)
BUN: 13 mg/dL (ref 8–23)
CO2: 19 mmol/L — ABNORMAL LOW (ref 22–32)
Calcium: 9 mg/dL (ref 8.9–10.3)
Chloride: 90 mmol/L — ABNORMAL LOW (ref 98–111)
Creatinine, Ser: 0.84 mg/dL (ref 0.44–1.00)
GFR, Estimated: >60 mL/min (ref >60)
Glucose, Bld: 89 mg/dL (ref 70–99)
Potassium: 4.5 mmol/L (ref 3.5–5.1)
Sodium: 117 mmol/L — CL (ref 135–145)
Total Bilirubin: 0.9 mg/dL (ref 0.3–1.2)
Total Protein: 6.1 g/dL — ABNORMAL LOW (ref 6.5–8.1)

## 2022-08-20 LAB — CBC WITH DIFFERENTIAL/PLATELET
Abs Immature Granulocytes: 0.03 10*3/uL (ref 0.00–0.07)
Basophils Absolute: 0 10*3/uL (ref 0.0–0.1)
Basophils Relative: 0 %
Eosinophils Absolute: 0 10*3/uL (ref 0.0–0.5)
Eosinophils Relative: 0 %
HCT: 35.3 % — ABNORMAL LOW (ref 36.0–46.0)
Hemoglobin: 12.6 g/dL (ref 12.0–15.0)
Immature Granulocytes: 1 %
Lymphocytes Relative: 14 %
Lymphs Abs: 0.8 10*3/uL (ref 0.7–4.0)
MCH: 29.6 pg (ref 26.0–34.0)
MCHC: 35.7 g/dL (ref 30.0–36.0)
MCV: 83.1 fL (ref 80.0–100.0)
Monocytes Absolute: 0.6 10*3/uL (ref 0.1–1.0)
Monocytes Relative: 10 %
Neutro Abs: 4.3 10*3/uL (ref 1.7–7.7)
Neutrophils Relative %: 75 %
Platelets: 211 10*3/uL (ref 150–400)
RBC: 4.25 MIL/uL (ref 3.87–5.11)
RDW: 13.1 % (ref 11.5–15.5)
WBC: 5.8 10*3/uL (ref 4.0–10.5)
nRBC: 0 % (ref 0.0–0.2)

## 2022-08-20 LAB — URINALYSIS, W/ REFLEX TO CULTURE (INFECTION SUSPECTED)
Bacteria, UA: NONE SEEN
Bilirubin Urine: NEGATIVE
Glucose, UA: NEGATIVE mg/dL
Ketones, ur: NEGATIVE mg/dL
Nitrite: NEGATIVE
Protein, ur: 100 mg/dL — AB
Specific Gravity, Urine: 1.01 (ref 1.005–1.030)
pH: 6 (ref 5.0–8.0)

## 2022-08-20 LAB — MRSA NEXT GEN BY PCR, NASAL: MRSA by PCR Next Gen: NOT DETECTED

## 2022-08-20 LAB — CBG MONITORING, ED: Glucose-Capillary: 110 mg/dL — ABNORMAL HIGH (ref 70–99)

## 2022-08-20 LAB — BRAIN NATRIURETIC PEPTIDE: B Natriuretic Peptide: 283.4 pg/mL — ABNORMAL HIGH (ref 0.0–100.0)

## 2022-08-20 LAB — GLUCOSE, CAPILLARY: Glucose-Capillary: 102 mg/dL — ABNORMAL HIGH (ref 70–99)

## 2022-08-20 MED ORDER — ATROPINE SULFATE 1 % OP SOLN
1.0000 [drp] | Freq: Two times a day (BID) | OPHTHALMIC | Status: DC
  Administered 2022-08-20 – 2022-08-22 (×4): 1 [drp] via OPHTHALMIC
  Filled 2022-08-20: qty 2

## 2022-08-20 MED ORDER — PREDNISONE 5 MG PO TABS
2.5000 mg | ORAL_TABLET | Freq: Every day | ORAL | Status: DC
  Administered 2022-08-21 – 2022-08-22 (×2): 2.5 mg via ORAL
  Filled 2022-08-20 (×2): qty 1

## 2022-08-20 MED ORDER — CHLORHEXIDINE GLUCONATE CLOTH 2 % EX PADS
6.0000 | MEDICATED_PAD | Freq: Every day | CUTANEOUS | Status: DC
  Administered 2022-08-20 – 2022-08-21 (×2): 6 via TOPICAL

## 2022-08-20 MED ORDER — BRIMONIDINE TARTRATE 0.2 % OP SOLN
1.0000 [drp] | Freq: Two times a day (BID) | OPHTHALMIC | Status: DC
  Filled 2022-08-20: qty 5

## 2022-08-20 MED ORDER — AMPHOTERICIN B LIPOSOME 50 MG IV SUSR: 1.0000 mg | Freq: Four times a day (QID) | INTRAVENOUS | Status: DC

## 2022-08-20 MED ORDER — POLYETHYLENE GLYCOL 3350 17 G PO PACK: 17.0000 g | PACK | Freq: Every day | ORAL | Status: DC | PRN

## 2022-08-20 MED ORDER — HUMALOG KWIKPEN 200 UNIT/ML ~~LOC~~ SOPN
100.0000 [IU] | PEN_INJECTOR | SUBCUTANEOUS | 0 refills | Status: DC
  Filled 2022-08-20: qty 12, 24d supply, fill #0
  Filled 2022-10-08: qty 18, 36d supply, fill #1
  Filled 2022-11-19: qty 18, 36d supply, fill #2
  Filled 2023-01-26: qty 12, 24d supply, fill #3

## 2022-08-20 MED ORDER — PREDNISONE 5 MG PO TABS: 5.0000 mg | ORAL_TABLET | Freq: Every day | ORAL | Status: DC

## 2022-08-20 MED ORDER — CARMEX CLASSIC LIP BALM EX OINT
1.0000 | TOPICAL_OINTMENT | CUTANEOUS | Status: DC | PRN
  Filled 2022-08-20: qty 10

## 2022-08-20 MED ORDER — INSULIN GLARGINE-YFGN 100 UNIT/ML ~~LOC~~ SOLN: 12.0000 [IU] | Freq: Every day | SUBCUTANEOUS | Status: DC

## 2022-08-20 MED ORDER — ONDANSETRON HCL 4 MG/2ML IJ SOLN: 4.0000 mg | Freq: Four times a day (QID) | INTRAMUSCULAR | Status: DC | PRN

## 2022-08-20 MED ORDER — AMPHOTERICIN B LIPOSOME 50 MG IV SUSR
1.0000 mg | Freq: Four times a day (QID) | INTRAVENOUS | Status: DC
  Administered 2022-08-20 – 2022-08-22 (×8): 1 mg via OPHTHALMIC

## 2022-08-20 MED ORDER — ORAL CARE MOUTH RINSE: 15.0000 mL | OROMUCOSAL | Status: DC | PRN

## 2022-08-20 MED ORDER — SENNOSIDES-DOCUSATE SODIUM 8.6-50 MG PO TABS
1.0000 | ORAL_TABLET | Freq: Two times a day (BID) | ORAL | Status: DC
  Administered 2022-08-21 – 2022-08-22 (×2): 1 via ORAL
  Filled 2022-08-20 (×3): qty 1

## 2022-08-20 MED ORDER — INSULIN ASPART 100 UNIT/ML IJ SOLN
0.0000 [IU] | Freq: Three times a day (TID) | INTRAMUSCULAR | Status: DC
  Administered 2022-08-21 – 2022-08-22 (×3): 2 [IU] via SUBCUTANEOUS
  Filled 2022-08-20: qty 0.15

## 2022-08-20 MED ORDER — ERYTHROMYCIN 5 MG/GM OP OINT
1.0000 | TOPICAL_OINTMENT | Freq: Four times a day (QID) | OPHTHALMIC | Status: DC
  Administered 2022-08-20 – 2022-08-22 (×8): 1 via OPHTHALMIC

## 2022-08-20 MED ORDER — INSULIN ASPART 100 UNIT/ML IJ SOLN
0.0000 [IU] | Freq: Every day | INTRAMUSCULAR | Status: DC
  Filled 2022-08-20: qty 0.05

## 2022-08-20 MED ORDER — VORICONAZOLE POWD
1.0000 [drp] | Status: DC
  Administered 2022-08-20 – 2022-08-22 (×23): 1 [drp] via OPHTHALMIC

## 2022-08-20 MED ORDER — GATIFLOXACIN 0.5 % OP SOLN
1.0000 [drp] | Freq: Four times a day (QID) | OPHTHALMIC | Status: DC
  Administered 2022-08-20 – 2022-08-22 (×9): 1 [drp] via OPHTHALMIC
  Filled 2022-08-20: qty 2.5

## 2022-08-20 MED ORDER — IBUPROFEN 200 MG PO TABS
400.0000 mg | ORAL_TABLET | Freq: Four times a day (QID) | ORAL | Status: DC | PRN
  Administered 2022-08-21: 400 mg via ORAL
  Filled 2022-08-20: qty 2

## 2022-08-20 MED ORDER — SODIUM CHLORIDE 0.9 % IV SOLN: INTRAVENOUS | Status: DC

## 2022-08-20 MED ORDER — INSULIN GLARGINE-YFGN 100 UNIT/ML ~~LOC~~ SOLN
8.0000 [IU] | Freq: Every day | SUBCUTANEOUS | Status: DC
  Administered 2022-08-21 – 2022-08-22 (×2): 8 [IU] via SUBCUTANEOUS
  Filled 2022-08-20 (×2): qty 0.08

## 2022-08-20 MED ORDER — ONDANSETRON HCL 4 MG PO TABS: 4.0000 mg | ORAL_TABLET | Freq: Four times a day (QID) | ORAL | Status: DC | PRN

## 2022-08-20 MED ORDER — ERYTHROMYCIN 5 MG/GM OP OINT
1.0000 | TOPICAL_OINTMENT | Freq: Four times a day (QID) | OPHTHALMIC | Status: DC
  Administered 2022-08-20: 1 via OPHTHALMIC
  Filled 2022-08-20: qty 3.5
  Filled 2022-08-20 (×2): qty 1

## 2022-08-20 MED ORDER — POLYVINYL ALCOHOL 1.4 % OP SOLN
1.0000 [drp] | OPHTHALMIC | Status: DC
  Administered 2022-08-20 – 2022-08-22 (×23): 1 [drp] via OPHTHALMIC
  Filled 2022-08-20: qty 15

## 2022-08-20 MED ORDER — PANTOPRAZOLE SODIUM 40 MG PO TBEC
40.0000 mg | DELAYED_RELEASE_TABLET | Freq: Two times a day (BID) | ORAL | Status: DC
  Administered 2022-08-20 – 2022-08-22 (×4): 40 mg via ORAL
  Filled 2022-08-20 (×4): qty 1

## 2022-08-20 MED ORDER — ALBUTEROL SULFATE (2.5 MG/3ML) 0.083% IN NEBU: 2.5000 mg | INHALATION_SOLUTION | RESPIRATORY_TRACT | Status: DC | PRN

## 2022-08-20 MED ORDER — TRAZODONE HCL 50 MG PO TABS: 25.0000 mg | ORAL_TABLET | Freq: Every evening | ORAL | Status: DC | PRN

## 2022-08-20 MED ORDER — NEOMYCIN-BACITRACIN ZN-POLYMYX 5-400-10000 OP OINT: TOPICAL_OINTMENT | Freq: Four times a day (QID) | OPHTHALMIC | Status: DC

## 2022-08-20 MED ORDER — BRIMONIDINE TARTRATE 0.2 % OP SOLN
1.0000 [drp] | Freq: Two times a day (BID) | OPHTHALMIC | Status: DC
  Administered 2022-08-20 – 2022-08-22 (×4): 1 [drp] via OPHTHALMIC
  Filled 2022-08-20: qty 5

## 2022-08-20 MED ORDER — VORICONAZOLE POWD: 1.0000 [drp] | Status: DC

## 2022-08-20 MED ORDER — ENOXAPARIN SODIUM 40 MG/0.4ML IJ SOSY
40.0000 mg | PREFILLED_SYRINGE | Freq: Every day | INTRAMUSCULAR | Status: DC
  Administered 2022-08-20 – 2022-08-22 (×3): 40 mg via SUBCUTANEOUS
  Filled 2022-08-20 (×3): qty 0.4

## 2022-08-20 NOTE — ED Notes (Signed)
Critical value from lab    sodium   118

## 2022-08-20 NOTE — ED Notes (Signed)
ED TO INPATIENT HANDOFF REPORT  Name/Age/Gender [name] [name] 77 y.o. female  Code Status    Code Status Orders  (From admission, onward)           Start     Ordered   08/20/22 1551  Full code  Continuous       Question:  By:  Answer:  Consent: discussion documented in EHR   08/20/22 1551           Code Status History     Date Active Date Inactive Code Status Order ID Comments User Context   08/09/2020 1148 08/18/2020 1824 Full Code [id number]  Karie Soda, MD Inpatient       Home/SNF/Other Home  Chief Complaint Hyponatremia [E87.1]  Level of Care/Admitting Diagnosis ED Disposition     ED Disposition  Admit   Condition  --   Comment  Hospital Area: [facility] [[id number]]  Level of Care: Stepdown [14]  Admit to SDU based on following criteria: Severe physiological/psychological symptoms:  Any diagnosis requiring assessment & intervention at least every 4 hours on an ongoing basis to obtain desired patient outcomes including stability and rehabilitation  May admit [facility] to [facility] or [facility] if equivalent level of care is available:: Yes  Covid Evaluation: Asymptomatic - no recent exposure (last 10 days) testing not required  Diagnosis: Hyponatremia [198519]  Admitting Physician: Maryln Gottron [[id number]]  Attending Physician: Kirby Crigler, MIR [email or web address] [[id number]]  Certification:: I certify this patient will need inpatient services for at least 2 midnights  Estimated Length of Stay: 3          Medical History Past Medical History:  Diagnosis Date   Colon cancer (HCC)    Diabetes mellitus without complication (HCC)    Glaucoma    Goals of care, counseling/discussion 09/01/2020   Hepatitis C    Hepatocellular carcinoma (HCC)    Hypertension     Allergies Allergies  Allergen Reactions   Bactrim [Sulfamethoxazole-Trimethoprim] Swelling   Clarithromycin Swelling and Rash    Daughter is unsure if  Clarithromycin or Amoxicillin caused rash and swelling.  Patient states no allergy to amoxicillan - has taken this recently with no issues. (12/02/21)    IV Location/Drains/Wounds Patient Lines/Drains/Airways Status     Active Line/Drains/Airways     Name Placement date Placement time Site Days   Implanted Port 09/18/20 Right Chest 09/18/20  1623  Chest  701   Incision - 4 Ports Abdomen 1: Umbilicus 2: Umbilicus;Superior 3: Left;Lower;Medial 4: Left;Superior 08/09/20  8469  -- 741            Labs/Imaging Results for orders placed or performed during the hospital encounter of 08/20/22 (from the past 48 hour(s))  Brain natriuretic peptide     Status: Abnormal   Collection Time: 08/20/22 11:00 AM  Result Value Ref Range   B Natriuretic Peptide 283.4 (H) 0.0 - 100.0 pg/mL    Comment: Performed at [facility], [address] [place], [place] [address]  Comprehensive metabolic panel     Status: Abnormal   Collection Time: 08/20/22 11:00 AM  Result Value Ref Range   Sodium 117 (LL) 135 - 145 mmol/L    Comment: CRITICAL RESULT CALLED TO, READ BACK BY AND VERIFIED WITH BRIANNA S. RN AT 1247 ON 08/20/2022 BY MECIAL J.    Potassium 4.5 3.5 - 5.1 mmol/L   Chloride 90 (L) 98 - 111 mmol/L   CO2 19 (L) 22 - 32 mmol/L  Glucose, Bld 89 70 - 99 mg/dL    Comment: Glucose reference range applies only to samples taken after fasting for at least 8 hours.   BUN 13 8 - 23 mg/dL   Creatinine, Ser 2.84 0.44 - 1.00 mg/dL   Calcium 9.0 8.9 - 13.2 mg/dL   Total Protein 6.1 (L) 6.5 - 8.1 g/dL   Albumin 3.2 (L) 3.5 - 5.0 g/dL   AST 440 (H) 15 - 41 U/L   ALT 67 (H) 0 - 44 U/L   Alkaline Phosphatase 85 38 - 126 U/L   Total Bilirubin 0.9 0.3 - 1.2 mg/dL   GFR, Estimated >10 >27 mL/min    Comment: (NOTE) Calculated using the CKD-EPI Creatinine Equation (2021)    Anion gap 8 5 - 15    Comment: Performed at [facility], [address], [place], [place]  [address]  CBC with Differential     Status: Abnormal   Collection Time: 08/20/22 11:00 AM  Result Value Ref Range   WBC 5.8 4.0 - 10.5 K/uL   RBC 4.25 3.87 - 5.11 MIL/uL   Hemoglobin 12.6 12.0 - 15.0 g/dL   HCT 44.0 (L) 34.7 - 42.5 %   MCV 83.1 80.0 - 100.0 fL   MCH 29.6 26.0 - 34.0 pg   MCHC 35.7 30.0 - 36.0 g/dL   RDW 95.6 38.7 - 56.4 %   Platelets 211 150 - 400 K/uL   nRBC 0.0 0.0 - 0.2 %   Neutrophils Relative % 75 %   Neutro Abs 4.3 1.7 - 7.7 K/uL   Lymphocytes Relative 14 %   Lymphs Abs 0.8 0.7 - 4.0 K/uL   Monocytes Relative 10 %   Monocytes Absolute 0.6 0.1 - 1.0 K/uL   Eosinophils Relative 0 %   Eosinophils Absolute 0.0 0.0 - 0.5 K/uL   Basophils Relative 0 %   Basophils Absolute 0.0 0.0 - 0.1 K/uL   Immature Granulocytes 1 %   Abs Immature Granulocytes 0.03 0.00 - 0.07 K/uL    Comment: Performed at [facility], [address] [place], [place] [address]  Urinalysis, w/ Reflex to Culture (Infection Suspected) -Urine, Unspecified Source     Status: Abnormal   Collection Time: 08/20/22 12:42 PM  Result Value Ref Range   Specimen Source URINE, UNSPE    Color, Urine YELLOW YELLOW   APPearance CLEAR CLEAR   Specific Gravity, Urine 1.010 1.005 - 1.030   pH 6.0 5.0 - 8.0   Glucose, UA NEGATIVE NEGATIVE mg/dL   Hgb urine dipstick MODERATE (A) NEGATIVE   Bilirubin Urine NEGATIVE NEGATIVE   Ketones, ur NEGATIVE NEGATIVE mg/dL   Protein, ur 188 (A) NEGATIVE mg/dL   Nitrite NEGATIVE NEGATIVE   Leukocytes,Ua TRACE (A) NEGATIVE   RBC / HPF 0-5 0 - 5 RBC/hpf   WBC, UA 0-5 0 - 5 WBC/hpf    Comment:        Reflex urine culture not performed if WBC <=10, OR if Squamous epithelial cells >5. If Squamous epithelial cells >5 suggest recollection.    Bacteria, UA NONE SEEN NONE SEEN   Squamous Epithelial / HPF 0-5 0 - 5 /HPF    Comment: Performed at [facility], [address] [place], [place] [address]   DG Abdomen Acute W/Chest  Result  Date: 08/20/2022 CLINICAL DATA:  Shortness of breath. EXAM: DG ABDOMEN ACUTE WITH 1 VIEW CHEST COMPARISON:  MRI abdomen 07/14/2022 FINDINGS: Hyperinflation. There is some linear opacity lung  bases. Left-greater-than-right. Atelectasis or scar is favored over infiltrate. Chronic lung changes. No pneumothorax, effusion or edema. Stable cardiopericardial silhouette. Right upper chest port identified with tip at the SVC right atrial junction. Portable abdominal x-rays are under penetrated. There are a few air-fluid levels. Gas is seen in nondilated loops of colon with scattered stool. Minimal small bowel gas. No obvious free air seen beneath the diaphragm. IMPRESSION: Hyperinflation.  Chest port. Basilar scar or atelectasis favored over infiltrate. Limited abdominal radiographs. Nonspecific bowel gas pattern with scattered stool. Few air-fluid levels. Electronically Signed   By: Karen Kays M.D.   On: 08/20/2022 12:36    Pending Labs Unresulted Labs (From admission, onward)     Start     Ordered   08/21/22 0500  CBC  Daily,   R      08/20/22 1537   08/21/22 0500  Comprehensive metabolic panel  Tomorrow morning,   R        08/20/22 1615   08/20/22 1538  Basic metabolic panel  Now then every 4 hours,   R (with STAT occurrences)      08/20/22 1537            Vitals/Pain Today's Vitals   08/20/22 1330 08/20/22 1345 08/20/22 1400 08/20/22 1423  BP: (!) 141/70 138/73 (!) 140/70   Pulse: 93 97 96   Resp: 18  16   Temp:    98.4 F (36.9 C)  TempSrc:    Oral  SpO2: 94% 95% 95%   Weight:      Height:        Isolation Precautions No active isolations  Medications Medications  insulin aspart (novoLOG) injection 0-15 Units (has no administration in time range)  insulin aspart (novoLOG) injection 0-5 Units (has no administration in time range)  pantoprazole (PROTONIX) EC tablet 40 mg (has no administration in time range)  enoxaparin (LOVENOX) injection 40 mg (40 mg Subcutaneous Given 08/20/22  1617)  ibuprofen (ADVIL) tablet 400 mg (has no administration in time range)  traZODone (DESYREL) tablet 25 mg (has no administration in time range)  ondansetron (ZOFRAN) tablet 4 mg (has no administration in time range)    Or  ondansetron (ZOFRAN) injection 4 mg (has no administration in time range)  albuterol (PROVENTIL) (2.5 MG/3ML) 0.083% nebulizer solution 2.5 mg (has no administration in time range)  predniSONE (DELTASONE) tablet 2.5 mg (has no administration in time range)  senna-docusate (Senokot-S) tablet 1 tablet (has no administration in time range)  polyethylene glycol (MIRALAX / GLYCOLAX) packet 17 g (has no administration in time range)    Mobility walks with device

## 2022-08-20 NOTE — ED Notes (Signed)
Pt was wheeled to restroom.

## 2022-08-20 NOTE — Telephone Encounter (Signed)
Requested Prescriptions  Pending Prescriptions Disp Refills   insulin lispro (HUMALOG KWIKPEN) 200 UNIT/ML KwikPen 60 mL 0    Sig: Max daily dose 100 units.     Endocrinology:  Diabetes - Insulins Passed - 08/19/2022  8:16 PM      Passed - HBA1C is between 0 and 7.9 and within 180 days    Hemoglobin A1C  Date Value Ref Range Status  06/23/2022 6.6 (A) 4.0 - 5.6 % Final   HbA1c, POC (controlled diabetic range)  Date Value Ref Range Status  10/30/2021 7.5 (A) 0.0 - 7.0 % Final         Passed - Valid encounter within last 6 months    Recent Outpatient Visits           1 month ago Hypertension associated with diabetes [facility])   [facility] Marcine Matar, MD   7 months ago Lichenoid dermatitis   [facility] Marcine Matar, MD   8 months ago Need for vaccination for zoster   [facility] Brilliant, [place] L, RPH-CPP   9 months ago Type 2 diabetes mellitus with peripheral neuropathy [facility])   [facility] Marcine Matar, MD   11 months ago Lichenoid dermatitis   [facility] Marcine Matar, MD       Future Appointments             In 2 months Laural Benes, Binnie Rail, MD [facility]

## 2022-08-20 NOTE — ED Triage Notes (Addendum)
Patient reports weakness, SOB on exertion, right flank pain, decreased urine output, BLE swelling, and constipation x 4 days.  Patient recently had right eye surgery on June 5, culture from surgery came back positive and patient was started on abx.  Patient is cancer patient.

## 2022-08-20 NOTE — ED Provider Notes (Signed)
[place] EMERGENCY DEPARTMENT AT [facility] Provider Note   CSN: [id number] Arrival date & time: 08/20/22  1028     History  Chief Complaint  Patient presents with   Weakness    [name] [name] is a 77 y.o. female.   but she has had some The positive culture seen   Weakness -year-old cellulitisPatient with generalized weakness shortness of breath.  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  Is currently on work on salt orally.  Has had now 5 days of treatment.  Loaded on Monday with today being Friday.  [facility] had been managing.  And ophthalmology from [facility] had been treating the eye.  However patient's oncologist here.  Has a history of hepatocellular carcinoma and is on treatment.  Patient has swelling.  Reportedly over hands and extremities.  More short of breath.  More difficulty laying down.  No fevers.  No coughing.  Began after taking the antifungal but has been feeling weak for a while.   Past Medical History:  Diagnosis Date   Colon cancer (HCC)    Diabetes mellitus without complication (HCC)    Glaucoma    Goals of care, counseling/discussion 09/01/2020   Hepatitis C    Hepatocellular carcinoma (HCC)    Hypertension     Home Medications Prior to Admission medications   Medication Sig Start Date End Date Taking? Authorizing Provider  brimonidine (ALPHAGAN) 0.2 % ophthalmic solution 1 drop 2 (two) times daily. 09/10/20   [provider]  brimonidine (ALPHAGAN) 0.2 % ophthalmic solution Place 1 drop into both eyes 2 (two) times daily. 07/28/22     clobetasol ointment (TEMOVATE) 0.05 % Apply twice a day on affected areas Patient not taking: Reported on 06/29/2022 02/02/22     erythromycin ophthalmic ointment Place  0.5 inch ribbon to the right eye 4 (four) times daily. 07/28/22     gabapentin (NEURONTIN) 300 MG capsule Take 1 capsule (300 mg total) by mouth once nightly at bedtime. Patient not taking:  Reported on 06/25/2022 07/28/21   Hoy Register, MD  glucose blood (TRUE METRIX BLOOD GLUCOSE TEST) test strip Use as instructed 01/13/21   Marcine Matar, MD  insulin glargine (LANTUS SOLOSTAR) 100 UNIT/ML Solostar Pen Inject 12 Units into the skin daily. 01/18/22   [name], Konrad Dolores, MD  insulin lispro (HUMALOG KWIKPEN) 200 UNIT/ML KwikPen Max daily dose 100 units. 08/20/22   Marcine Matar, MD  insulin lispro (HUMALOG) 100 UNIT/ML injection SSI with meals: 200-250 3 units, 251-300 6 units, 301-350 9 units, 351-400 12 units, 401-450 15 units, > 451 18 units under the skin as directed 08/24/21   [provider]  Insulin Pen Needle 32G X 4 MM MISC Use with insulins daily. 01/18/22   [name], Konrad Dolores, MD  lenvatinib 8 mg daily dose (LENVIMA) 2 x 4 MG capsule Take 2 capsules (8 mg total) by mouth daily. Patient not taking: Reported on 06/29/2022 02/17/22   [name], MD  losartan (COZAAR) 100 MG tablet Take 1 tablet (100 mg total) by mouth at bedtime. 02/12/22   Marcine Matar, MD  losartan (COZAAR) 25 MG tablet Take 25 mg by mouth daily. 09/03/21   [provider]  Magnesium 400 MG TABS Take 1 tablet by mouth daily at 6 (six) AM. Patient not taking: Reported on 07/16/2022    [provider]  moxifloxacin (VIGAMOX) 0.5 % ophthalmic solution Place 1 drop into the right eye 4 (four) times  daily. 07/28/22     NIFEdipine (PROCARDIA-XL/NIFEDICAL-XL) 30 MG 24 hr tablet Take 1 tablet (30 mg total) by mouth in the morning and at bedtime. 07/14/22   Marcine Matar, MD  pantoprazole (PROTONIX) 40 MG tablet Take 1 tablet (40 mg total) by mouth 2 (two) times daily. 02/12/22   Marcine Matar, MD  polyethylene glycol powder (GLYCOLAX/MIRALAX) 17 GM/SCOOP powder Take by mouth daily as needed. Patient not taking: Reported on 08/13/2022 04/24/21   [provider]  prednisoLONE acetate (PRED FORTE) 1 % ophthalmic suspension Place 1 drop into the  right eye every 2 (two) hours. Patient not taking: Reported on 08/13/2022 07/28/22     predniSONE (DELTASONE) 5 MG tablet Take 3.5 tablets (17.5 mg total) by mouth daily for 30 days, THEN 3 tablets (15 mg total) daily for 30 days, THEN 2.5 tablets (12.5 mg total) daily for 30 days, THEN 2.5 tablets (12.5 mg total) daily for 30 days, THEN 2 tablets (10 mg total) daily for 30 days. 02/02/22     silver sulfADIAZINE (SILVADENE) 1 % cream Apply to the open, ulcerated areas of the skin once daily as needed. Patient not taking: Reported on 07/16/2022 02/02/22     triamcinolone ointment (KENALOG) 0.1 % Apply 1 Application topically 2 (two) times daily. 12/03/21   Shelby Mattocks, DO  Vitamin D, Cholecalciferol, 10 MCG (400 UNIT) TABS Take 1 tablet by mouth daily at 6 (six) AM. Patient not taking: Reported on 07/16/2022    [provider]  voriconazole (VFEND) 200 MG tablet Take 1 tablet (200 mg total) by mouth 2 (two) times a day. Take  400 mg twice daily for 2 doses ( 1st day only), then continue 200 mg twice daily Patient not taking: Reported on 08/13/2022 08/12/22         Allergies    Bactrim [sulfamethoxazole-trimethoprim] and Clarithromycin    Review of Systems   Review of Systems  Neurological:  Positive for weakness.    Physical Exam Updated Vital Signs BP (!) 140/70   Pulse 96   Temp 98.4 F (36.9 C) (Oral)   Resp 16   Ht 5\' 4"  (1.626 m)   Wt 68.9 kg   SpO2 95%   BMI 26.09 kg/m  Physical Exam Vitals and nursing note reviewed.  HENT:     Head: Atraumatic.  Cardiovascular:     Rate and Rhythm: Tachycardia present.  Neurological:     Mental Status: She is alert.     ED Results / Procedures / Treatments   Labs (all labs ordered are listed, but only abnormal results are displayed) Labs Reviewed  BRAIN NATRIURETIC PEPTIDE - Abnormal; Notable for the following components:      Result Value   B Natriuretic Peptide 283.4 (*)    All other components within normal limits   COMPREHENSIVE METABOLIC PANEL - Abnormal; Notable for the following components:   Sodium 117 (*)    Chloride 90 (*)    CO2 19 (*)    Total Protein 6.1 (*)    Albumin 3.2 (*)    AST 209 (*)    ALT 67 (*)    All other components within normal limits  CBC WITH DIFFERENTIAL/PLATELET - Abnormal; Notable for the following components:   HCT 35.3 (*)    All other components within normal limits  URINALYSIS, W/ REFLEX TO CULTURE (INFECTION SUSPECTED) - Abnormal; Notable for the following components:   Hgb urine dipstick MODERATE (*)    Protein, ur 100 (*)  Leukocytes,Ua TRACE (*)    All other components within normal limits  BASIC METABOLIC PANEL  BASIC METABOLIC PANEL  BASIC METABOLIC PANEL  BASIC METABOLIC PANEL    EKG None  Radiology DG Abdomen Acute W/Chest  Result Date: 08/20/2022 CLINICAL DATA:  Shortness of breath. EXAM: DG ABDOMEN ACUTE WITH 1 VIEW CHEST COMPARISON:  MRI abdomen 07/14/2022 FINDINGS: Hyperinflation. There is some linear opacity lung bases. Left-greater-than-right. Atelectasis or scar is favored over infiltrate. Chronic lung changes. No pneumothorax, effusion or edema. Stable cardiopericardial silhouette. Right upper chest port identified with tip at the SVC right atrial junction. Portable abdominal x-rays are under penetrated. There are a few air-fluid levels. Gas is seen in nondilated loops of colon with scattered stool. Minimal small bowel gas. No obvious free air seen beneath the diaphragm. IMPRESSION: Hyperinflation.  Chest port. Basilar scar or atelectasis favored over infiltrate. Limited abdominal radiographs. Nonspecific bowel gas pattern with scattered stool. Few air-fluid levels. Electronically Signed   By: Karen Kays M.D.   On: 08/20/2022 12:36    Procedures Procedures    Medications Ordered in ED Medications  insulin aspart (novoLOG) injection 0-15 Units (has no administration in time range)  insulin aspart (novoLOG) injection 0-5 Units (has no  administration in time range)  pantoprazole (PROTONIX) EC tablet 40 mg (has no administration in time range)  enoxaparin (LOVENOX) injection 40 mg (has no administration in time range)  ibuprofen (ADVIL) tablet 400 mg (has no administration in time range)  traZODone (DESYREL) tablet 25 mg (has no administration in time range)  ondansetron (ZOFRAN) tablet 4 mg (has no administration in time range)    Or  ondansetron (ZOFRAN) injection 4 mg (has no administration in time range)  albuterol (PROVENTIL) (2.5 MG/3ML) 0.083% nebulizer solution 2.5 mg (has no administration in time range)  predniSONE (DELTASONE) tablet 2.5 mg (has no administration in time range)  senna-docusate (Senokot-S) tablet 1 tablet (has no administration in time range)  polyethylene glycol (MIRALAX / GLYCOLAX) packet 17 g (has no administration in time range)    ED Course/ Medical Decision Making/ A&P                             Medical Decision Making Amount and/or Complexity of Data Reviewed Labs: ordered. Radiology: ordered.  Risk Decision regarding hospitalization.   Patient is shortness of breath and weakness.  Has known fungal infection.  Started voriconazole.  More fatigue.  More shortness of breath.  Also some pain on abdomen.  Decreased urination has had some constipation.  Does have some chronic constipation and started on pain medicine for her recent eye infection.  On the workup found to have severe hyponatremia with sodium of 117.  It appears voriconazole, a cause of his severe hyponatremia.  Chest x-ray overall reassuring but does have edema.  Not hypoxic.  Patient will benefit from mission the hospital.  Will discuss with hospitalist.  CRITICAL CARE Performed by: Benjiman Core Total critical care time: 30 minutes Critical care time was exclusive of separately billable procedures and treating other patients. Critical care was necessary to treat or prevent imminent or life-threatening  deterioration. Critical care was time spent personally by me on the following activities: development of treatment plan with patient and/or surrogate as well as nursing, discussions with consultants, evaluation of patient's response to treatment, examination of patient, obtaining history from patient or surrogate, ordering and performing treatments and interventions, ordering and review of laboratory studies, ordering  and review of radiographic studies, pulse oximetry and re-evaluation of patient's condition.  Discussed with Dr. Erenest Blank from the hospitalist.  Recommends consulting [facility] to see if they need to stay there.    Discussed with Dr. Silvestre Mesi from [facility] that is seen patient previously.  Recommends for now stopping the voriconazole.  He can see in follow-up early in the next week if needed.  Otherwise can be admitted to the hospital here.  Infectious ease also could be involved here.  Will discuss with ophthalmology through [facility] to see if there is need for transfer from their standpoint. discussed with Dr. Clelia Croft from ophthalmology.  Does not need transfer.  No immediate surgical plans.  Can stay at the [facility] system.   Will discuss with the hospitalist again.         Final Clinical Impression(s) / ED Diagnoses Final diagnoses:  Hyponatremia  Right endophthalmia    Rx / DC Orders ED Discharge Orders     None         Benjiman Core, MD 08/20/22 1616

## 2022-08-20 NOTE — H&P (Signed)
History and Physical  Torsha Gills ZOX:[id number] DOB: 06-05-1945 DOA: 08/20/2022  PCP: Marcine Matar, MD   Chief Complaint: low sodium, weakness   HPI: [name] [name] is a 77 y.o. female with medical history significant for hepatitis C, related liver cirrhosis and hepatocellular carcinoma under the care of Dr. Myna Hidalgo undergoing immunotherapy, insulin-dependent type 2 diabetes, glaucoma, and unfortunately recent right eye fungal infection after cataract surgery in [place] in March of this year.  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  She was treated with ceftazidime and vancomycin injection on 07/19/2022, she then had vitrectomy on 07/21/2022.  She also underwent injection of Amphotericin on 6/24, she was started on topical voriconazole and p.o. voriconazole 200 mg twice daily by infectious disease at [facility].  She started taking this medication on Monday, 08/16/2022.  She followed up at the ID clinic today for routine labs.  According to her daughter, this showed hyponatremia and elevated LFTs.  In the meantime, the patient has also been feeling weak, mildly nauseous without vomiting and so decided to come to the ER for evaluation.  She had a headache a couple days ago, but this has resolved.  She has also been dealing with quite a bit of constipation since she was started on oxycodone to control her pain in her right eye.  However, she took some MiraLAX yesterday, and finally had a good bowel movement today.  ED Course: In the emergency department, patient has been hemodynamically stable.  Lab work was done significant for abnormal LFTs, and hyponatremia.  Her sodium is usually in the normal range, today it is 117.  Her AST is 209, ALT 67, with normal alk phos.  Her baseline liver function tests are normal.  Review of Systems: Please see HPI for pertinent positives and negatives.  A complete 10 system review of systems are otherwise negative.  Past Medical History:  Diagnosis Date   Colon cancer (HCC)    Diabetes mellitus without complication (HCC)    Glaucoma    Goals of care, counseling/discussion 09/01/2020   Hepatitis C    Hepatocellular carcinoma (HCC)    Hypertension    Past Surgical History:  Procedure Laterality Date   COLON RESECTION  2005   In [place] - ?colon cancer   IR IMAGING GUIDED PORT INSERTION  09/18/2020   LAPAROSCOPIC GASTRIC RESECTION N/A 08/09/2020   Procedure: LAPAROSCOPIC EXPLORATION OF ABDOMEN, PERFORATED ULCER REPAIR, LIVER BIOPSY;  Surgeon: Karie Soda, MD;  Location: [facility];  Service: General;  Laterality: N/A;    Social History:  reports that she has never smoked. She has never used smokeless tobacco. She reports that she does not currently use alcohol. She reports that she does not use drugs.   Allergies  Allergen Reactions   Bactrim [Sulfamethoxazole-Trimethoprim] Swelling   Clarithromycin Swelling and Rash    Daughter is unsure if Clarithromycin or Amoxicillin caused rash and swelling.  Patient states no allergy to amoxicillan - has taken this recently with no issues. (12/02/21)    Family History  Problem Relation Age of Onset   Cancer Father        type unknown, was on the leg   Other Daughter        Acoustic neuroma   Breast cancer Neg Hx    Colon cancer Neg Hx    Esophageal cancer Neg Hx    Rectal cancer Neg Hx    Stomach cancer  Neg Hx      Prior to Admission medications   Medication Sig Start Date End Date Taking? Authorizing Provider  brimonidine (ALPHAGAN) 0.2 % ophthalmic solution 1 drop 2 (two) times daily. 09/10/20   [provider]  brimonidine (ALPHAGAN) 0.2 % ophthalmic solution Place 1 drop into both eyes 2 (two) times daily. 07/28/22     clobetasol ointment (TEMOVATE) 0.05 % Apply twice a day on affected areas Patient not taking: Reported on 06/29/2022 02/02/22     erythromycin ophthalmic  ointment Place  0.5 inch ribbon to the right eye 4 (four) times daily. 07/28/22     gabapentin (NEURONTIN) 300 MG capsule Take 1 capsule (300 mg total) by mouth once nightly at bedtime. Patient not taking: Reported on 06/25/2022 07/28/21   Hoy Register, MD  glucose blood (TRUE METRIX BLOOD GLUCOSE TEST) test strip Use as instructed 01/13/21   Marcine Matar, MD  insulin glargine (LANTUS SOLOSTAR) 100 UNIT/ML Solostar Pen Inject 12 Units into the skin daily. 01/18/22   [name], Konrad Dolores, MD  insulin lispro (HUMALOG KWIKPEN) 200 UNIT/ML KwikPen Max daily dose 100 units. 08/20/22   Marcine Matar, MD  insulin lispro (HUMALOG) 100 UNIT/ML injection SSI with meals: 200-250 3 units, 251-300 6 units, 301-350 9 units, 351-400 12 units, 401-450 15 units, > 451 18 units under the skin as directed 08/24/21   [provider]  Insulin Pen Needle 32G X 4 MM MISC Use with insulins daily. 01/18/22   [name], Konrad Dolores, MD  lenvatinib 8 mg daily dose (LENVIMA) 2 x 4 MG capsule Take 2 capsules (8 mg total) by mouth daily. Patient not taking: Reported on 06/29/2022 02/17/22   [name], MD  losartan (COZAAR) 100 MG tablet Take 1 tablet (100 mg total) by mouth at bedtime. 02/12/22   Marcine Matar, MD  losartan (COZAAR) 25 MG tablet Take 25 mg by mouth daily. 09/03/21   [provider]  Magnesium 400 MG TABS Take 1 tablet by mouth daily at 6 (six) AM. Patient not taking: Reported on 07/16/2022    [provider]  moxifloxacin (VIGAMOX) 0.5 % ophthalmic solution Place 1 drop into the right eye 4 (four) times daily. 07/28/22     NIFEdipine (PROCARDIA-XL/NIFEDICAL-XL) 30 MG 24 hr tablet Take 1 tablet (30 mg total) by mouth in the morning and at bedtime. 07/14/22   Marcine Matar, MD  pantoprazole (PROTONIX) 40 MG tablet Take 1 tablet (40 mg total) by mouth 2 (two) times daily. 02/12/22   Marcine Matar, MD  polyethylene glycol powder (GLYCOLAX/MIRALAX) 17  GM/SCOOP powder Take by mouth daily as needed. Patient not taking: Reported on 08/13/2022 04/24/21   [provider]  prednisoLONE acetate (PRED FORTE) 1 % ophthalmic suspension Place 1 drop into the right eye every 2 (two) hours. Patient not taking: Reported on 08/13/2022 07/28/22     predniSONE (DELTASONE) 5 MG tablet Take 3.5 tablets (17.5 mg total) by mouth daily for 30 days, THEN 3 tablets (15 mg total) daily for 30 days, THEN 2.5 tablets (12.5 mg total) daily for 30 days, THEN 2.5 tablets (12.5 mg total) daily for 30 days, THEN 2 tablets (10 mg total) daily for 30 days. 02/02/22     silver sulfADIAZINE (SILVADENE) 1 % cream Apply to the open, ulcerated areas of the skin once daily as needed. Patient not taking: Reported on 07/16/2022 02/02/22     triamcinolone ointment (KENALOG) 0.1 % Apply 1 Application topically 2 (two) times  daily. 12/03/21   Shelby Mattocks, DO  Vitamin D, Cholecalciferol, 10 MCG (400 UNIT) TABS Take 1 tablet by mouth daily at 6 (six) AM. Patient not taking: Reported on 07/16/2022    [provider]  voriconazole (VFEND) 200 MG tablet Take 1 tablet (200 mg total) by mouth 2 (two) times a day. Take  400 mg twice daily for 2 doses ( 1st day only), then continue 200 mg twice daily Patient not taking: Reported on 08/13/2022 08/12/22       Physical Exam: BP (!) 140/70   Pulse 96   Temp 98.4 F (36.9 C) (Oral)   Resp 16   Ht 5\' 4"  (1.626 m)   Wt 68.9 kg   SpO2 95%   BMI 26.09 kg/m   General:  Alert, oriented x4, calm, in no acute distress, pleasant and cooperative, daughter is at the bedside Eyes: Right eye is swollen shut with lid edema, no significant periorbital edema, with some mild purulent discharge, no surrounding cellulitis Neck: supple, no masses, trachea mildline  Cardiovascular: RRR, no murmurs or rubs, no peripheral edema  Respiratory: clear to auscultation bilaterally, no wheezes, no crackles  Abdomen: soft, nontender, nondistended, normal  bowel tones heard  Skin: dry, no rashes, she has small black palpable papules covering her skin, per daughter this occurred when she started hepatitis treatment, and has been improving gradually with steroids Musculoskeletal: no joint effusions, normal range of motion  Psychiatric: appropriate affect, normal speech  Neurologic: extraocular muscles intact, clear speech, moving all extremities with intact sensorium          Labs on Admission:  Basic Metabolic Panel: Recent Labs  Lab 08/20/22 1100  NA 117*  K 4.5  CL 90*  CO2 19*  GLUCOSE 89  BUN 13  CREATININE 0.84  CALCIUM 9.0   Liver Function Tests: Recent Labs  Lab 08/20/22 1100  AST 209*  ALT 67*  ALKPHOS 85  BILITOT 0.9  PROT 6.1*  ALBUMIN 3.2*   No results for input(s): "LIPASE", "AMYLASE" in the last 168 hours. No results for input(s): "AMMONIA" in the last 168 hours. CBC: Recent Labs  Lab 08/20/22 1100  WBC 5.8  NEUTROABS 4.3  HGB 12.6  HCT 35.3*  MCV 83.1  PLT 211   Cardiac Enzymes: No results for input(s): "CKTOTAL", "CKMB", "CKMBINDEX", "TROPONINI" in the last 168 hours.  BNP (last 3 results) Recent Labs    08/20/22 1100  BNP 283.4*    ProBNP (last 3 results) No results for input(s): "PROBNP" in the last 8760 hours.  CBG: No results for input(s): "GLUCAP" in the last 168 hours.  Radiological Exams on Admission: DG Abdomen Acute W/Chest  Result Date: 08/20/2022 CLINICAL DATA:  Shortness of breath. EXAM: DG ABDOMEN ACUTE WITH 1 VIEW CHEST COMPARISON:  MRI abdomen 07/14/2022 FINDINGS: Hyperinflation. There is some linear opacity lung bases. Left-greater-than-right. Atelectasis or scar is favored over infiltrate. Chronic lung changes. No pneumothorax, effusion or edema. Stable cardiopericardial silhouette. Right upper chest port identified with tip at the SVC right atrial junction. Portable abdominal x-rays are under penetrated. There are a few air-fluid levels. Gas is seen in nondilated loops of  colon with scattered stool. Minimal small bowel gas. No obvious free air seen beneath the diaphragm. IMPRESSION: Hyperinflation.  Chest port. Basilar scar or atelectasis favored over infiltrate. Limited abdominal radiographs. Nonspecific bowel gas pattern with scattered stool. Few air-fluid levels. Electronically Signed   By: Karen Kays M.D.   On: 08/20/2022 12:36  Assessment/Plan Marit Aseghanka Epse [name] is a 77 y.o. female with medical history significant for hepatitis C, related liver cirrhosis and hepatocellular carcinoma under the care of Dr. 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  2-week, she was started on systemic voriconazole and presents to the hospital with severe hyponatremia and abnormal LFTs.  Severe hyponatremia-patient was admitted with new hyponatremia sodium 117, she is not having headache, confusion, but did present with some lethargy/weakness.  Unclear etiology, though hyponatremia is an uncommon but known side effect of voriconazole.  This is her only new medication of late. 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  Dr. Rubin Payor discussed with ID and ophthalmology at [facility] prior to admission, they recommend discontinue systemic voriconazole. -Discontinue p.o. voriconazole -Continue Alphagan, erythromycin, gatifloxacin, Amphotericin in the right eye -Close outpatient ID and ophthalmology follow-up  Abnormal LFTs-almost certainly due to voriconazole -Trend with morning labs  Hepatocellular carcinoma-currently under the treatment of Dr. Myna Hidalgo receiving  nivolumab. -Will hold nivolumab in-house -Dr. Myna Hidalgo added to inpatient treatment team  Insulin-dependent type 2 diabetes -Carb controlled diet, fluid restriction -Semglee at reduced dose of 8 units daily -Sliding scale insulin  Papular rash-followed by dermatology for this, empirically placed on prednisone and is improving -Continue prednisone 2.5 mg p.o. daily  Constipation-continue scheduled Senokot twice daily, with as needed MiraLAX  GERD-continue Protonix 40 mg p.o. twice daily  DVT prophylaxis: Lovenox     Code Status: Full Code  Consults called: None  Admission status: The appropriate patient status for this patient is INPATIENT. Inpatient status is judged to be reasonable and necessary in order to provide the required intensity of service to ensure the patient's safety. The patient's presenting symptoms, physical exam findings, and initial radiographic and laboratory data in the context of their chronic comorbidities is felt to place them at high risk for further clinical deterioration. Furthermore, it is not anticipated that the patient will be medically stable for discharge from the hospital within 2 midnights of admission.    I certify that at the point of admission it is my clinical judgment that the patient will require inpatient hospital care spanning beyond 2 midnights from the point of admission due to high intensity of service, high risk for further deterioration and high frequency of surveillance required  Time spent: 75 minutes  Julyan Gales Sharlette Dense MD [facility] Pager [phone]  If 7PM-7AM, please contact night-coverage [email or web address] Password [email or web address]  08/20/2022, 4:25 PM

## 2022-08-20 NOTE — ED Notes (Signed)
Port accessed

## 2022-08-21 ENCOUNTER — Encounter (HOSPITAL_COMMUNITY): Payer: Self-pay | Admitting: Internal Medicine

## 2022-08-21 ENCOUNTER — Other Ambulatory Visit: Payer: Self-pay

## 2022-08-21 DIAGNOSIS — R7989 Other specified abnormal findings of blood chemistry: Secondary | ICD-10-CM | POA: Insufficient documentation

## 2022-08-21 DIAGNOSIS — E871 Hypo-osmolality and hyponatremia: Principal | ICD-10-CM

## 2022-08-21 DIAGNOSIS — H5989 Other postprocedural complications and disorders of eye and adnexa, not elsewhere classified: Secondary | ICD-10-CM | POA: Insufficient documentation

## 2022-08-21 LAB — CBC
HCT: 35 % — ABNORMAL LOW (ref 36.0–46.0)
Hemoglobin: 12.2 g/dL (ref 12.0–15.0)
MCH: 29.3 pg (ref 26.0–34.0)
MCHC: 34.9 g/dL (ref 30.0–36.0)
MCV: 84.1 fL (ref 80.0–100.0)
Platelets: 221 10*3/uL (ref 150–400)
RBC: 4.16 MIL/uL (ref 3.87–5.11)
RDW: 13.5 % (ref 11.5–15.5)
WBC: 4.9 10*3/uL (ref 4.0–10.5)
nRBC: 0 % (ref 0.0–0.2)

## 2022-08-21 LAB — COMPREHENSIVE METABOLIC PANEL
ALT: 68 U/L — ABNORMAL HIGH (ref 0–44)
AST: 223 U/L — ABNORMAL HIGH (ref 15–41)
Albumin: 3 g/dL — ABNORMAL LOW (ref 3.5–5.0)
Alkaline Phosphatase: 81 U/L (ref 38–126)
Anion gap: 7 (ref 5–15)
BUN: 8 mg/dL (ref 8–23)
CO2: 20 mmol/L — ABNORMAL LOW (ref 22–32)
Calcium: 8.9 mg/dL (ref 8.9–10.3)
Chloride: 97 mmol/L — ABNORMAL LOW (ref 98–111)
Creatinine, Ser: 0.67 mg/dL (ref 0.44–1.00)
GFR, Estimated: >60 mL/min (ref >60)
Glucose, Bld: 88 mg/dL (ref 70–99)
Potassium: 4 mmol/L (ref 3.5–5.1)
Sodium: 124 mmol/L — ABNORMAL LOW (ref 135–145)
Total Bilirubin: 0.8 mg/dL (ref 0.3–1.2)
Total Protein: 5.6 g/dL — ABNORMAL LOW (ref 6.5–8.1)

## 2022-08-21 LAB — BASIC METABOLIC PANEL
Anion gap: 6 (ref 5–15)
Anion gap: 6 (ref 5–15)
Anion gap: 6 (ref 5–15)
BUN: 7 mg/dL — ABNORMAL LOW (ref 8–23)
BUN: 6 mg/dL — ABNORMAL LOW (ref 8–23)
BUN: 9 mg/dL (ref 8–23)
CO2: 20 mmol/L — ABNORMAL LOW (ref 22–32)
CO2: 20 mmol/L — ABNORMAL LOW (ref 22–32)
CO2: 18 mmol/L — ABNORMAL LOW (ref 22–32)
Calcium: 8.8 mg/dL — ABNORMAL LOW (ref 8.9–10.3)
Calcium: 8.9 mg/dL (ref 8.9–10.3)
Calcium: 8.5 mg/dL — ABNORMAL LOW (ref 8.9–10.3)
Chloride: 100 mmol/L (ref 98–111)
Chloride: 102 mmol/L (ref 98–111)
Chloride: 104 mmol/L (ref 98–111)
Creatinine, Ser: 0.61 mg/dL (ref 0.44–1.00)
Creatinine, Ser: 0.68 mg/dL (ref 0.44–1.00)
Creatinine, Ser: 0.75 mg/dL (ref 0.44–1.00)
GFR, Estimated: >60 mL/min (ref >60)
GFR, Estimated: >60 mL/min (ref >60)
GFR, Estimated: >60 mL/min (ref >60)
Glucose, Bld: 78 mg/dL (ref 70–99)
Glucose, Bld: 116 mg/dL — ABNORMAL HIGH (ref 70–99)
Glucose, Bld: 156 mg/dL — ABNORMAL HIGH (ref 70–99)
Potassium: 4 mmol/L (ref 3.5–5.1)
Potassium: 4 mmol/L (ref 3.5–5.1)
Potassium: 4.2 mmol/L (ref 3.5–5.1)
Sodium: 126 mmol/L — ABNORMAL LOW (ref 135–145)
Sodium: 128 mmol/L — ABNORMAL LOW (ref 135–145)
Sodium: 128 mmol/L — ABNORMAL LOW (ref 135–145)

## 2022-08-21 LAB — GLUCOSE, CAPILLARY
Glucose-Capillary: 76 mg/dL (ref 70–99)
Glucose-Capillary: 116 mg/dL — ABNORMAL HIGH (ref 70–99)
Glucose-Capillary: 137 mg/dL — ABNORMAL HIGH (ref 70–99)
Glucose-Capillary: 181 mg/dL — ABNORMAL HIGH (ref 70–99)

## 2022-08-21 MED ORDER — NEOMYCIN-BACITRACIN ZN-POLYMYX 5-400-10000 OP OINT
TOPICAL_OINTMENT | Freq: Four times a day (QID) | OPHTHALMIC | Status: DC
  Filled 2022-08-21: qty 3.5

## 2022-08-21 MED ORDER — SODIUM CHLORIDE 0.9% FLUSH: 10.0000 mL | INTRAVENOUS | Status: DC | PRN

## 2022-08-21 NOTE — Consult Note (Signed)
[name] [name] is well-known to me.  She is a very charming 77 year old African woman from [place].  She has metastatic hepatocellular carcinoma.  She had Hepatitis C.  The Hepatitis C has been treated.  We have had her on immunotherapy with incredible success for the hepatocellular carcinoma.  Her last alpha-fetoprotein was down to 6.3.  Her last MRI done in late May showed continued decrease in the volume of her hepatic disease.  Of note, she was in [place] to visit.  She came back.  While in [place], we had her on Sutent.  Recently, she had ophthalmologic surgery.  She developed an infection in the right eye.  Surprising, this infection did not be a fungal infection.  When I saw her in the office a couple weeks ago, she was could be put on voriconazole.  She has been treated to [facility].  Apparently, she developed weakness.  She said her body was swelling.  She had a hard time walking.  She had little bit of difficulty with shortness of breath.  She had lab work done at [facility] office.  This is on 08/20/2022.  This showed a sodium 117.  Potassium 4.5.  BUN 13 creatinine 0.84.  Calcium 9.0.  Her LFTs were also somewhat elevated.  She subsequently was admitted.  She did have chest x-ray done with abdominal.  This is relatively unremarkable.  She also problems with constipation.  This is probably from the pain medication.  She has been given some IV fluid with sodium chloride.  The voriconazole has been discontinued.  Patient also has diabetes.  I think this is been under decent control.  Her sodium has come up quite nicely.  Today, her sodium is 124.  Potassium 4.0.  BUN 8 creatinine 0.67.  Calcium 8.9.  Her LFTs show a SGPT of 60 SGOT of 223.  She has had no fever.   She has had no obvious bleeding.  There is been no rashes although she has this chronic rash on her legs.  She does have a significant sensitivity to medications.  The rash on her legs probably is from the hepatitis C treatment that she  has been given in the past.  She feels better.   Her vital signs show temperature of 98.1.  Pulse 103.  Blood pressure 129/53.  Her head and exam shows her right eye to be closed.  Left eye has good range of motion of the extraocular muscles.  There is no oral lesions.  There is no thrush.  Lungs are clear bilaterally.  She has decent air movement bilaterally.  Cardiac exam regular rate and rhythm.  Abdomen is soft.  Bowel sounds are present.  She has no fluid wave.  She has the abdominal surgical scars from past surgery.  There is no palpable liver or spleen tip.  Extremity shows no clubbing, cyanosis or edema.  Skin exam does show the macular rash on her legs which is stable.  Neurological exam is nonfocal.   Braylei is a very nice 77 year old [place] woman.  She has hepatocellular carcinoma in the setting of Hepatitis C.  She has responded incredibly well to immunotherapy.  Of note, she last got immunotherapy about 2 weeks ago.  She now developed significant hyponatremia.  I have to believe this is from the voriconazole and may be Amphotericin that she received for this fungal infection.  This is resolving.  She looks pretty good to me.  I do not see a lot of swelling at the present  time.  I know that she is getting incredible care from everybody in the ICU.  I would like to hope that she will be able to be moved out of the ICU real soon as her sodium is coming up.  We will follow along and try to help out any way that we need to.   Christin Bach, MD  [place] [phone]

## 2022-08-21 NOTE — Progress Notes (Signed)
I spoke with patient's daughter, Hector Brunswick earlier in shift. She reported to me that patient has been taking pain medication at home as a result she has been constipated 3x days. Daughter gave pt OTC laxative at home on 7/5. Patient has had >2 watery stools

## 2022-08-21 NOTE — Progress Notes (Signed)
[facility] Progress Note  Patient: [name] [name]     WGN:[id number]  DOA: 08/20/2022   PCP: Marcine Matar, MD       Brief hospital course: With metastatic hepatocellular cancer, hepatitis C that was treated, diabetes mellitus, glaucoma and a recent fungal infection in her right eye with Scopulariopsis .  The patient underwent cataract surgery in [place] in March of this year prior to developing this infection. She has been following with [facility] ophthalmology and has been treated with ceftazidime, vancomycin followed by vitrectomy on 6/5 with/24 6/24-Amphotericin injection followed by topical voriconazole and p.o. voriconazole The patient was following up at the ID clinic for routine labs and was found to have hyponatremia and elevated LFTs which has been associated with severe generalized weakness and therefore she was recommended to come to the ED. Furthermore she has been dealing with constipation secondary to oxycodone and headaches.  In the ED she was noted to have a sodium of 117.  Other than complaining of lethargy, she had no neurological complaints. In regards to right eye fungal infection, ED provider spoke with [facility] ophthalmology.  Recommendations were to discontinue p.o. voriconazole, administer Alphagan, erythromycin, gatifloxacin and local Amphotericin. Abnormal LFTs also felt to be secondary to oral voriconazole.  Subjective:  The patient states that she was in severe diffuse pain yesterday and now it is only localized to her left lower quadrant.  No complaints of nausea, vomiting, diarrhea or sore throat.  She has pain in her left eye but this is not a new complaint.  She was severely weak yesterday but does feel stronger today.  Assessment and Plan: Principal Problem:   Hyponatremia Mild metabolic acidosis -Sodium 117 when admitted -Bicarb was 18 -Felt to be secondary to voriconazole treatment - Sodium is steadily improving with slow  infusion of normal saline and is now 128 -  bicarbonate level is 20 -Will hold normal saline today as it has gone up by 10 points in the past 24 hours - Will recheck this evening and again tomorrow morning  Active Problems: Elevated LFTs Noted on admission: AST 209, ALT 67 - Oral voriconazole on hold-continue to follow  Hepatocellular cancer -History of hepatitis C that was treated - Is treated with lenvatinib as outpatient    Insulin-requiring or dependent type II diabetes mellitus (HCC) -As outpatient, she receives Basaglar and Humalog -Has been switched to Semglee and NovoLog in the hospital    Endophthalmitis after procedure-right eye -Continue topical voriconazole every 2 hours, gatifloxacin drops every 4 hours, amphotericin B  4 times a day, Alphagan twice daily     Code Status: Full Code Consultants: Oncology Level of Care: Level of care: Stepdown Total time on patient care: 40 minutes DVT prophylaxis:  enoxaparin (LOVENOX) injection 40 mg Start: 08/20/22 1600 SCDs Start: 08/20/22 1550     Objective:   Vitals:   08/20/22 2300 08/21/22 0000 08/21/22 0100 08/21/22 0300  BP:    (!) 129/53  Pulse: (!) 106 99 (!) 103   Resp: (!) 32 17 (!) 22   Temp: 98.9 F (37.2 C)   98.1 F (36.7 C)  TempSrc: Oral   Oral  SpO2: 96% 96% 95%   Weight:      Height:       Filed Weights   08/20/22 1035 08/20/22 1727  Weight: 68.9 kg 76.8 kg   Exam: General exam: Appears comfortable  HEENT: oral mucosa moist-extensive 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  Cardiovascular system: S1 & S2 heard  Gastrointestinal system: Abdomen soft, tenderness in left lower quadrant, nondistended. Normal bowel sounds   Extremities: No cyanosis, clubbing or edema Psychiatry:  Mood & affect appropriate.      CBC: Recent Labs  Lab 08/20/22 1100 08/21/22 0318  WBC 5.8 4.9  NEUTROABS 4.3  --   HGB 12.6 12.2  HCT 35.3* 35.0*   MCV 83.1 84.1  PLT 211 221   Basic Metabolic Panel: Recent Labs  Lab 08/20/22 1100 08/20/22 1557 08/20/22 2030 08/20/22 2334 08/21/22 0318  NA 117* 118* 121* 122* 124*  K 4.5 4.4 4.1 3.9 4.0  CL 90* 92* 91* 93* 97*  CO2 19* 20* 19* 19* 20*  GLUCOSE 89 76 127* 92 88  BUN 13 12 11 9 8   CREATININE 0.84 0.76 0.73 0.67 0.67  CALCIUM 9.0 8.9 8.8* 8.4* 8.9   GFR: Estimated Creatinine Clearance: 63.9 mL/min (by C-G formula based on SCr of 0.67 mg/dL).  Scheduled Meds:  amphotericin B liposome  1 mg Right Eye QID   atropine  1 drop Right Eye BID   brimonidine  1 drop Left Eye BID   Chlorhexidine Gluconate Cloth  6 each Topical Daily   enoxaparin (LOVENOX) injection  40 mg Subcutaneous Daily   erythromycin  1 Application Right Eye QID   gatifloxacin  1 drop Right Eye QID   insulin aspart  0-15 Units Subcutaneous TID WC   insulin aspart  0-5 Units Subcutaneous QHS   insulin glargine-yfgn  8 Units Subcutaneous Daily   neomycin-bacitracin-polymyxin   Right Eye QID   pantoprazole  40 mg Oral BID   polyvinyl alcohol  1 drop Right Eye Q2H   predniSONE  2.5 mg Oral Q breakfast   senna-docusate  1 tablet Oral BID   Voriconazole  1 drop Right Eye Q2H   Continuous Infusions:  sodium chloride 50 mL/hr at 08/21/22 0640   Imaging and lab data was personally reviewed DG Abdomen Acute W/Chest  Result Date: 08/20/2022 CLINICAL DATA:  Shortness of breath. EXAM: DG ABDOMEN ACUTE WITH 1 VIEW CHEST COMPARISON:  MRI abdomen 07/14/2022 FINDINGS: Hyperinflation. There is some linear opacity lung bases. Left-greater-than-right. Atelectasis or scar is favored over infiltrate. Chronic lung changes. No pneumothorax, effusion or edema. Stable cardiopericardial silhouette. Right upper chest port identified with tip at the SVC right atrial junction. Portable abdominal x-rays are under penetrated. There are a few air-fluid levels. Gas is seen in nondilated loops of colon with scattered stool. Minimal small  bowel gas. No obvious free air seen beneath the diaphragm. IMPRESSION: Hyperinflation.  Chest port. Basilar scar or atelectasis favored over infiltrate. Limited abdominal radiographs. Nonspecific bowel gas pattern with scattered stool. Few air-fluid levels. Electronically Signed   By: Karen Kays M.D.   On: 08/20/2022 12:36    LOS: 1 day   Author: Calvert Cantor  08/21/2022 7:36 AM  To contact [facility]>   Check the care team in [facility] and look for the attending/consulting [facility] provider listed  Log into [email or web address] and use [facility]'s universal password   Go to> "[facility]"  and find provider  If you still have difficulty reaching the provider, please page the [facility] (Director on Call) for the Hospitalists listed on amion

## 2022-08-22 LAB — BASIC METABOLIC PANEL
Anion gap: 7 (ref 5–15)
BUN: 8 mg/dL (ref 8–23)
CO2: 20 mmol/L — ABNORMAL LOW (ref 22–32)
Calcium: 9.1 mg/dL (ref 8.9–10.3)
Chloride: 106 mmol/L (ref 98–111)
Creatinine, Ser: 0.77 mg/dL (ref 0.44–1.00)
GFR, Estimated: >60 mL/min (ref >60)
Glucose, Bld: 123 mg/dL — ABNORMAL HIGH (ref 70–99)
Potassium: 4.2 mmol/L (ref 3.5–5.1)
Sodium: 133 mmol/L — ABNORMAL LOW (ref 135–145)

## 2022-08-22 LAB — COMPREHENSIVE METABOLIC PANEL
ALT: 65 U/L — ABNORMAL HIGH (ref 0–44)
AST: 134 U/L — ABNORMAL HIGH (ref 15–41)
Albumin: 3 g/dL — ABNORMAL LOW (ref 3.5–5.0)
Alkaline Phosphatase: 81 U/L (ref 38–126)
Anion gap: 6 (ref 5–15)
BUN: 8 mg/dL (ref 8–23)
CO2: 21 mmol/L — ABNORMAL LOW (ref 22–32)
Calcium: 8.9 mg/dL (ref 8.9–10.3)
Chloride: 106 mmol/L (ref 98–111)
Creatinine, Ser: 0.66 mg/dL (ref 0.44–1.00)
GFR, Estimated: >60 mL/min (ref >60)
Glucose, Bld: 153 mg/dL — ABNORMAL HIGH (ref 70–99)
Potassium: 4.2 mmol/L (ref 3.5–5.1)
Sodium: 133 mmol/L — ABNORMAL LOW (ref 135–145)
Total Bilirubin: 0.6 mg/dL (ref 0.3–1.2)
Total Protein: 5.8 g/dL — ABNORMAL LOW (ref 6.5–8.1)

## 2022-08-22 LAB — CBC
HCT: 35.3 % — ABNORMAL LOW (ref 36.0–46.0)
Hemoglobin: 12.1 g/dL (ref 12.0–15.0)
MCH: 29.7 pg (ref 26.0–34.0)
MCHC: 34.3 g/dL (ref 30.0–36.0)
MCV: 86.7 fL (ref 80.0–100.0)
Platelets: 240 10*3/uL (ref 150–400)
RBC: 4.07 MIL/uL (ref 3.87–5.11)
RDW: 14.4 % (ref 11.5–15.5)
WBC: 5.9 10*3/uL (ref 4.0–10.5)
nRBC: 0 % (ref 0.0–0.2)

## 2022-08-22 LAB — GLUCOSE, CAPILLARY
Glucose-Capillary: 114 mg/dL — ABNORMAL HIGH (ref 70–99)
Glucose-Capillary: 136 mg/dL — ABNORMAL HIGH (ref 70–99)
Glucose-Capillary: 139 mg/dL — ABNORMAL HIGH (ref 70–99)

## 2022-08-22 LAB — TSH: TSH: 11.674 u[IU]/mL — ABNORMAL HIGH (ref 0.350–4.500)

## 2022-08-22 MED ORDER — ACETAMINOPHEN 325 MG PO TABS
650.0000 mg | ORAL_TABLET | Freq: Four times a day (QID) | ORAL | Status: DC | PRN
  Administered 2022-08-22: 650 mg via ORAL
  Filled 2022-08-22: qty 2

## 2022-08-22 MED ORDER — HEPARIN SOD (PORK) LOCK FLUSH 100 UNIT/ML IV SOLN
500.0000 [IU] | INTRAVENOUS | Status: AC | PRN
  Administered 2022-08-22: 500 [IU]

## 2022-08-22 MED ORDER — VORICONAZOLE POWD
1.0000 [drp] | 0 refills | Status: DC
  Filled 2022-08-22: qty 5, fill #0

## 2022-08-22 MED ORDER — GATIFLOXACIN 0.5 % OP SOLN
1.0000 [drp] | Freq: Four times a day (QID) | OPHTHALMIC | 0 refills | Status: AC
  Filled 2022-08-22: qty 2.5, 13d supply, fill #0

## 2022-08-22 MED ORDER — AMPHOTERICIN B LIPOSOME 50 MG IV SUSR
1.0000 mg | Freq: Four times a day (QID) | INTRAVENOUS | 0 refills | Status: AC
  Filled 2022-08-22: qty 5, 5d supply, fill #0

## 2022-08-22 NOTE — Discharge Summary (Signed)
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  The patient underwent cataract surgery in [place] in March of this year prior to developing this infection. She has been following with [facility] ophthalmology and has been treated with ceftazidime, vancomycin followed by vitrectomy on 6/5 with/24 6/24-Amphotericin injection followed by topical voriconazole and p.o. voriconazole The patient was following up at the ID clinic for routine labs and was found to have hyponatremia and elevated LFTs which has been associated with severe generalized weakness and therefore she was recommended to come to the ED. Furthermore she has been dealing with constipation secondary to oxycodone and headaches.   In the ED she was noted to have a sodium of 117.  Other than complaining of lethargy, she had no neurological complaints. In regards to right eye fungal infection, ED provider spoke with [facility] ophthalmology.  Recommendations were to discontinue p.o. voriconazole, administer Alphagan, erythromycin, gatifloxacin and local Amphotericin. Abnormal LFTs also felt to be secondary to oral voriconazole.     Assessment and Plan: Principal Problem:   Hyponatremia -Sodium 117 when admitted- she was asymptomatic -Felt to be secondary to voriconazole treatment - given a slow [place] infusion and Voriconazole held-  Sodium now 133     Active Problems: Elevated LFTs Noted on admission: AST 209, ALT 67 - Oral voriconazole on hold-continue to follow   Hepatocellular cancer -History of hepatitis C that was treated - Is treated with lenvatinib as outpatient     Insulin-requiring or dependent type II diabetes mellitus (HCC) -As outpatient, she receives Basaglar and Humalog -Has been switched to Semglee and NovoLog in the hospital     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 Nutrition Status:          Discharge Instructions   Allergies as of 08/22/2022       Reactions   Bactrim [sulfamethoxazole-trimethoprim] Swelling, Other (See Comments)   Site of swelling not recalled   Clarithromycin Swelling, Rash, Other (See Comments)   Daughter is unsure if Clarithromycin or Amoxicillin caused rash and swelling. Patient states no allergy to amoxicillan - has taken this recently with no issues. (12/02/21)     Med Rec must be completed prior to using this [facility]***           The results of significant diagnostics from this hospitalization (including imaging, microbiology, ancillary and laboratory) are listed below for reference.    DG Abdomen Acute W/Chest  Result Date: 08/20/2022 CLINICAL DATA:  Shortness of breath. EXAM: DG ABDOMEN ACUTE WITH 1 VIEW CHEST COMPARISON:  MRI abdomen 07/14/2022 FINDINGS: Hyperinflation. There is some linear opacity lung bases. Left-greater-than-right. Atelectasis or scar is favored over infiltrate. Chronic lung changes. No pneumothorax, effusion or edema. Stable cardiopericardial silhouette. Right upper chest port identified with tip at the SVC right  atrial junction. Portable abdominal x-rays are under penetrated. There are a few air-fluid levels. Gas is seen in nondilated loops of colon with  scattered stool. Minimal small bowel gas. No obvious free air seen beneath the diaphragm. IMPRESSION: Hyperinflation.  Chest port. Basilar scar or atelectasis favored over infiltrate. Limited abdominal radiographs. Nonspecific bowel gas pattern with scattered stool. Few air-fluid levels. Electronically Signed   By: Karen Kays M.D.   On: 08/20/2022 12:36   Labs:   Basic Metabolic Panel: Recent Labs  Lab 08/21/22 0318 08/21/22 0740 08/21/22 1228 08/21/22 1906 08/22/22 0345  NA 124* 126* 128* 128* 133*  K 4.0 4.0 4.0 4.2 4.2  CL 97* 100 102 104 106  CO2 20* 20* 20* 18* 20*  GLUCOSE 88 78 116* 156* 123*  BUN 8 7* 6* 9 8  CREATININE 0.67 0.61 0.68 0.75 0.77  CALCIUM 8.9 8.8* 8.9 8.5* 9.1     CBC: Recent Labs  Lab 08/20/22 1100 08/21/22 0318 08/22/22 0345  WBC 5.8 4.9 5.9  NEUTROABS 4.3  --   --   HGB 12.6 12.2 12.1  HCT 35.3* 35.0* 35.3*  MCV 83.1 84.1 86.7  PLT 211 221 240         SIGNED:   Calvert Cantor, MD  [facility] 08/22/2022, 3:42 PM

## 2022-08-22 NOTE — Plan of Care (Signed)

## 2022-08-23 ENCOUNTER — Telehealth: Payer: Self-pay

## 2022-08-23 ENCOUNTER — Other Ambulatory Visit: Payer: Self-pay

## 2022-08-23 ENCOUNTER — Encounter: Payer: Self-pay | Admitting: Hematology & Oncology

## 2022-08-23 LAB — GLUCOSE, CAPILLARY: Glucose-Capillary: 128 mg/dL — ABNORMAL HIGH (ref 70–99)

## 2022-08-23 NOTE — Transitions of Care (Post Inpatient/ED Visit) (Signed)
08/23/2022  Name: [name] [name] MRN: [id number] DOB: 1946/02/06  Today's TOC FU Call Status: Today's TOC FU Call Status:: Successful TOC FU Call Competed TOC FU Call Complete Date: 08/23/22  Transition Care Management Follow-up Telephone Call Date of Discharge: 08/22/22 Discharge Facility: [facility] Type of Discharge: Inpatient Admission Primary Inpatient Discharge Diagnosis:: hyponatremia How have you been since you were released from the hospital?: Better [name] [name] said her mother is feeling better than when she went to the hospital. Today they had an appt with the eye surgeon in [place]. Her mother had to walk up to the third floor to get to their apt. when they returned home, so she is very tired. [name] [name]) Any questions or concerns?: Yes Patient Questions/Concerns:: [name] [name] said her mother was quite indpendent until a few days prior to her hospitalization. She is now starting to feel better. [name] [name] has been working from home and providing all assistance for her mother which she said has been very stressful  She wanted to update Dr Laural Benes on her mother's status and I told her that I would share the information from this call.  She said that her mother will not qualify for regular Medicaid at this time because she has onliy been in this country 2.5 years and needs to be here for 5 years.  She will however qualify for emergency Medicaid for her hospitalization. She said that the oral variconazole is what caused the hypoatremia and that medication has been discontinued. Wednesday, 08/25/2022 her mother has an appointment with the infectious disease ophthalmologist in [facility] Patient Questions/Concerns Addressed: Notified Provider of Patient Questions/Concerns  Items Reviewed: Did you receive and understand the discharge instructions provided?: Yes Medications obtained,verified, and reconciled?: Partial Review Completed Reason for Partial Mediation Review: [name] [name] said  she has all of the medications as well as a glucometer.  She did not have any questions about the meds at this time Any new allergies since your discharge?: No Dietary orders reviewed?: Yes Type of Diet Ordered:: heart healthy diabetic.  [name] [name] said her mother is just starting to eat again. Do you have support at home?: Yes People in Home: child(ren), adult Name of Support/Comfort Primary Source: She lives with her daughter, [name] [name].  Medications Reviewed Today: Medications Reviewed Today     Reviewed by Salvatore Marvel, CPhT (Pharmacy Technician) on 08/20/22 at 1844  Med List Status: Complete   Medication Order Taking? Sig Documenting Provider Last Dose Status Informant  atropine 1 % ophthalmic solution [id number] Yes Place 1 drop into the right eye in the morning and at bedtime. [provider] 08/20/2022 am Active Family Member  brimonidine (ALPHAGAN) 0.2 % ophthalmic solution [id number] Yes Place 1 drop into both eyes 2 (two) times daily.  Patient taking differently: Place 1 drop into the left eye 2 (two) times daily.    08/20/2022 am Active Family Member  clobetasol ointment (TEMOVATE) 0.05 % [id number] No Apply twice a day on affected areas  Patient not taking: Reported on 08/20/2022    Not Taking Active Family Member  erythromycin ophthalmic ointment [id number] Yes Place  0.5 inch ribbon to the right eye 4 (four) times daily.  08/20/2022 am Active Family Member  gabapentin (NEURONTIN) 300 MG capsule [id number] No Take 1 capsule (300 mg total) by mouth once nightly at bedtime.  Patient not taking: Reported on 08/20/2022   Hoy Register, MD Not Taking Active Family Member  glucose blood (TRUE METRIX BLOOD GLUCOSE TEST) test strip [id number]  Use as  instructed [name] Matar, MD  Active Family Member  Insulin Glargine [facility] 100 UNIT/ML [id number] Yes Inject 12 Units into the skin in the morning. [provider] 08/19/2022 Active Family Member  insulin glargine (LANTUS  SOLOSTAR) 100 UNIT/ML Solostar Pen [id number] No Inject 12 Units into the skin daily.  Patient not taking: Reported on 08/20/2022   [name], Konrad Dolores, MD Not Taking Active Family Member  insulin lispro (HUMALOG KWIKPEN) 200 UNIT/ML KwikPen [id number] Yes Max daily dose 100 units.  Patient taking differently: Inject 12 Units into the skin See admin instructions. Inject 12 units into the skin three times a day before meals, plus a sliding scale for a BGL above 155. Taper as directed by MD [name]. Max daily dose is 100 units. BGL <155 = give nothing; 156-180 = 1 unit; 181-205 = 2 units; 206-230 = 3 units; 231-255 = 4 units; 256-280 = 5 units; 281-305 = 6 units; 306-330 = 7 units; 331-355 = 8 units; 256-380 = 9 units. Each time Prednisone is decreased, please decrease Humalog by 4 units with each meal.   [name] Matar, MD 08/19/2022 pm Active Family Member  Insulin Pen Needle 32G X 4 MM MISC [id number]  Use with insulins daily. [name], Konrad Dolores, MD  Active Family Member  lenvatinib 8 mg daily dose (LENVIMA) 2 x 4 MG capsule [id number] No Take 2 capsules (8 mg total) by mouth daily.  Patient not taking: Reported on 08/20/2022   [name] Macho, MD Not Taking Active Family Member  lidocaine-prilocaine (EMLA) cream [id number] Yes Apply 1 Application topically as needed (for port access). [provider] unk Active Family Member  losartan (COZAAR) 100 MG tablet [id number] Yes Take 1 tablet (100 mg total) by mouth at bedtime. [name] Matar, MD 08/19/2022 pm Active Family Member  moxifloxacin (VIGAMOX) 0.5 % ophthalmic solution [id number] Yes Place 1 drop into the right eye 4 (four) times daily.  08/20/2022 am Active Family Member  Neomycin-Bacitracin Zn-Polymyx 3.5-400-10000 OINT [id number] Yes Place into the right eye See admin instructions. Apply a 0.25" ribbon to the right eye 4 times a day [provider] 08/20/2022 am Active Family Member  NIFEdipine  (PROCARDIA-XL/NIFEDICAL-XL) 30 MG 24 hr tablet [id number] Yes Take 1 tablet (30 mg total) by mouth in the morning and at bedtime. [name] Matar, MD 08/19/2022 Active Family Member  NONFORMULARY OR COMPOUNDED ITEM [id number] Yes Place 1 drop into the right eye See admin instructions. Voriconazole 1% solution- Instill 1 drop into the right eye four times a day [provider] 08/20/2022 Active Family Member  NONFORMULARY OR COMPOUNDED ITEM [id number] Yes Place 1 drop into the right eye See admin instructions. Amphotericin-B liposomal ophthalmic solution 0.15%- Instill 1 drop into the right eye four times a day [provider] 08/20/2022 Active Family Member  oxyCODONE-acetaminophen (PERCOCET/ROXICET) 5-325 MG tablet [id number] Yes Take 1 tablet by mouth every 4 (four) hours as needed for moderate pain. [provider] 08/18/2022 Active Family Member  pantoprazole (PROTONIX) 40 MG tablet [id number] Yes Take 1 tablet (40 mg total) by mouth 2 (two) times daily. [name] Matar, MD 08/19/2022 Active Family Member  polyethylene glycol powder (GLYCOLAX/MIRALAX) 17 GM/SCOOP powder [id number] Yes Take 17 g by mouth daily as needed for mild constipation. [provider] unk Active Family Member  prednisoLONE acetate (PRED FORTE) 1 % ophthalmic suspension [id number] No Place 1 drop into the right eye every 2 (two) hours.  Patient not taking: Reported on 08/20/2022  Not Taking Active Family Member  predniSONE (DELTASONE) 5 MG tablet [id number] Yes Take 3.5 tablets (17.5 mg total) by mouth daily for 30 days, THEN 3 tablets (15 mg total) daily for 30 days, THEN 2.5 tablets (12.5 mg total) daily for 30 days, THEN 2.5 tablets (12.5 mg total) daily for 30 days, THEN 2 tablets (10 mg total) daily for 30 days.  Patient taking differently: Take 2.5 mg by mouth See admin instructions. Take 2.5 mg by mouth with breakfast and d/c on 09/03/2022. To be re-assessed by MD.    08/19/2022 am Active Family  Member           Med Note Antony Madura, Arn Medal   Fri Aug 20, 2022  4:55 PM)    REFRESH TEARS 0.5 % SOLN [id number] Yes Place 1 drop into the right eye every 2 (two) hours. [provider] 08/20/2022 Active Family Member  silver sulfADIAZINE (SILVADENE) 1 % cream [id number] No Apply to the open, ulcerated areas of the skin once daily as needed.  Patient not taking: Reported on 08/20/2022    Not Taking Active Family Member  triamcinolone ointment (KENALOG) 0.1 % [id number] No Apply 1 Application topically 2 (two) times daily.  Patient not taking: Reported on 08/20/2022   Shelby Mattocks, DO Not Taking Active Family Member  TYLENOL 500 MG tablet [id number] Yes Take 1,000 mg by mouth every 6 (six) hours as needed (for pain). [provider] 08/19/2022 Active Family Member  voriconazole (VFEND) 200 MG tablet [id number] No Take 1 tablet (200 mg total) by mouth 2 (two) times a day. Take  400 mg twice daily for 2 doses ( 1st day only), then continue 200 mg twice daily  Patient not taking: Reported on 08/20/2022    Not Taking Active Family Member  Med List Note Otis Peak, [facility] 02/18/22 1017): Lenvima filled through [facility]            Home Care and Equipment/Supplies: Were Home Health Services Ordered?: No Any new equipment or medical supplies ordered?: No  Functional Questionnaire: Do you need assistance with meal preparation?: Yes ([name] [name] assists) Do you need assistance with eating?: No Do you have difficulty maintaining continence: No Do you need assistance with getting out of bed/getting out of a chair/moving?: Yes ([name] [name] assists as needed.) Do you have difficulty managing or taking your medications?: Yes ([name] [name] said her mother has been very independent and still checks her own blood sugars and administers her insulin. [name] [name] stated that she may need to learn how to do those things so she can assist in the future.)  Follow up appointments reviewed: PCP  Follow-up appointment confirmed?: Yes Date of PCP follow-up appointment?: 09/07/22 Follow-up Provider: Dr [facility] Follow-up appointment confirmed?: Yes Date of Specialist follow-up appointment?: 08/25/22 Follow-Up Specialty Provider:: ID ophthalmology; 09/10/2022- Dr Myna Hidalgo Do you need transportation to your follow-up appointment?: No Do you understand care options if your condition(s) worsen?: Yes-patient verbalized understanding    SIGNATURE  Robyne Peers, RN

## 2022-08-23 NOTE — Telephone Encounter (Signed)
From the [facility] call.   [name] [name] said her mother is feeling better than when she went to the hospital. Today they had an appt with the eye surgeon in [place]. Her mother had to walk up to the third floor to get back to their apt. when they returned home, so she is very tired. [name] [name] said her mother was quite indpendent until a few days prior to her hospitalization. She is now starting to feel better. [name] [name] has been working from home and providing all assistance for her mother which she said has been very stressful  She wanted to update Dr Laural Benes on her mother's status and I told her that I would share the information from this call.    She said that the oral variconazole is what caused the hypoatremia and that medication has been discontinued. Wednesday, 08/25/2022 her mother has an appointment with the infectious disease ophthalmologist in [facility] said her mother has been very independent and still checks her own blood sugars and administers her insulin. [name] [name] stated that she may need to learn how to do those things so she can assist in the future. I explained that we can schedule an appointment for her with our clinical pharmacist if she is interested.     Follow-up Provider: Dr Laural Benes- -09/07/2022.

## 2022-08-24 ENCOUNTER — Other Ambulatory Visit: Payer: Self-pay

## 2022-08-27 ENCOUNTER — Encounter: Payer: Self-pay | Admitting: Hematology & Oncology

## 2022-08-27 ENCOUNTER — Other Ambulatory Visit: Payer: Self-pay

## 2022-08-30 ENCOUNTER — Other Ambulatory Visit: Payer: Self-pay

## 2022-08-30 ENCOUNTER — Encounter: Payer: Self-pay | Admitting: Hematology & Oncology

## 2022-08-30 MED ORDER — BACITRACIN-POLYMYXIN B 500-10000 UNIT/GM OP OINT
TOPICAL_OINTMENT | OPHTHALMIC | 11 refills | Status: DC
  Filled 2022-08-30 – 2022-09-07 (×2): qty 3.5, 15d supply, fill #0
  Filled 2022-09-20: qty 3.5, 15d supply, fill #1

## 2022-08-31 ENCOUNTER — Other Ambulatory Visit: Payer: Self-pay

## 2022-09-02 ENCOUNTER — Other Ambulatory Visit: Payer: Self-pay

## 2022-09-03 ENCOUNTER — Other Ambulatory Visit: Payer: Self-pay

## 2022-09-03 ENCOUNTER — Encounter: Payer: Self-pay | Admitting: Hematology & Oncology

## 2022-09-06 ENCOUNTER — Other Ambulatory Visit: Payer: Self-pay

## 2022-09-07 ENCOUNTER — Ambulatory Visit: Payer: No Typology Code available for payment source | Admitting: Internal Medicine

## 2022-09-07 ENCOUNTER — Encounter: Payer: Self-pay | Admitting: Hematology & Oncology

## 2022-09-07 ENCOUNTER — Other Ambulatory Visit: Payer: Self-pay

## 2022-09-10 ENCOUNTER — Other Ambulatory Visit: Payer: Self-pay

## 2022-09-10 ENCOUNTER — Encounter: Payer: Self-pay | Admitting: Hematology & Oncology

## 2022-09-10 ENCOUNTER — Inpatient Hospital Stay (HOSPITAL_BASED_OUTPATIENT_CLINIC_OR_DEPARTMENT_OTHER): Payer: Self-pay | Admitting: Hematology & Oncology

## 2022-09-10 ENCOUNTER — Inpatient Hospital Stay: Payer: Self-pay

## 2022-09-10 ENCOUNTER — Other Ambulatory Visit: Payer: Self-pay | Admitting: *Deleted

## 2022-09-10 ENCOUNTER — Inpatient Hospital Stay: Payer: Self-pay | Attending: Hematology & Oncology

## 2022-09-10 VITALS — BP 153/69 | HR 96

## 2022-09-10 VITALS — BP 163/68 | HR 92 | Temp 98.5°F | Resp 18 | Ht 64.0 in | Wt 153.0 lb

## 2022-09-10 DIAGNOSIS — C220 Liver cell carcinoma: Secondary | ICD-10-CM | POA: Insufficient documentation

## 2022-09-10 DIAGNOSIS — B182 Chronic viral hepatitis C: Secondary | ICD-10-CM

## 2022-09-10 DIAGNOSIS — E032 Hypothyroidism due to medicaments and other exogenous substances: Secondary | ICD-10-CM

## 2022-09-10 DIAGNOSIS — Z5112 Encounter for antineoplastic immunotherapy: Secondary | ICD-10-CM | POA: Insufficient documentation

## 2022-09-10 DIAGNOSIS — E871 Hypo-osmolality and hyponatremia: Secondary | ICD-10-CM | POA: Insufficient documentation

## 2022-09-10 DIAGNOSIS — Z95828 Presence of other vascular implants and grafts: Secondary | ICD-10-CM

## 2022-09-10 DIAGNOSIS — B1920 Unspecified viral hepatitis C without hepatic coma: Secondary | ICD-10-CM | POA: Insufficient documentation

## 2022-09-10 LAB — LACTATE DEHYDROGENASE: LDH: 229 U/L — ABNORMAL HIGH (ref 98–192)

## 2022-09-10 LAB — CMP (CANCER CENTER ONLY)
ALT: 31 U/L (ref 0–44)
AST: 31 U/L (ref 15–41)
Albumin: 4.1 g/dL (ref 3.5–5.0)
Alkaline Phosphatase: 110 U/L (ref 38–126)
Anion gap: 9 (ref 5–15)
BUN: 15 mg/dL (ref 8–23)
CO2: 27 mmol/L (ref 22–32)
Calcium: 11.5 mg/dL — ABNORMAL HIGH (ref 8.9–10.3)
Chloride: 105 mmol/L (ref 98–111)
Creatinine: 1 mg/dL (ref 0.44–1.00)
GFR, Estimated: 58 mL/min — ABNORMAL LOW (ref >60)
Glucose, Bld: 225 mg/dL — ABNORMAL HIGH (ref 70–99)
Potassium: 4 mmol/L (ref 3.5–5.1)
Sodium: 141 mmol/L (ref 135–145)
Total Bilirubin: 0.6 mg/dL (ref 0.3–1.2)
Total Protein: 7.1 g/dL (ref 6.5–8.1)

## 2022-09-10 LAB — CBC WITH DIFFERENTIAL (CANCER CENTER ONLY)
Abs Immature Granulocytes: 0.01 10*3/uL (ref 0.00–0.07)
Basophils Absolute: 0 10*3/uL (ref 0.0–0.1)
Basophils Relative: 0 %
Eosinophils Absolute: 0 10*3/uL (ref 0.0–0.5)
Eosinophils Relative: 1 %
HCT: 38.6 % (ref 36.0–46.0)
Hemoglobin: 12.7 g/dL (ref 12.0–15.0)
Immature Granulocytes: 0 %
Lymphocytes Relative: 22 %
Lymphs Abs: 1 10*3/uL (ref 0.7–4.0)
MCH: 29.7 pg (ref 26.0–34.0)
MCHC: 32.9 g/dL (ref 30.0–36.0)
MCV: 90.2 fL (ref 80.0–100.0)
Monocytes Absolute: 0.4 10*3/uL (ref 0.1–1.0)
Monocytes Relative: 10 %
Neutro Abs: 3 10*3/uL (ref 1.7–7.7)
Neutrophils Relative %: 67 %
Platelet Count: 270 10*3/uL (ref 150–400)
RBC: 4.28 MIL/uL (ref 3.87–5.11)
RDW: 15.6 % — ABNORMAL HIGH (ref 11.5–15.5)
WBC Count: 4.5 10*3/uL (ref 4.0–10.5)
nRBC: 0 % (ref 0.0–0.2)

## 2022-09-10 MED ORDER — SODIUM CHLORIDE 0.9 % IV SOLN: Freq: Once | INTRAVENOUS | Status: AC

## 2022-09-10 MED ORDER — HEPARIN SOD (PORK) LOCK FLUSH 100 UNIT/ML IV SOLN
500.0000 [IU] | Freq: Once | INTRAVENOUS | Status: AC | PRN
  Administered 2022-09-10: 500 [IU]

## 2022-09-10 MED ORDER — SODIUM CHLORIDE 0.9 % IV SOLN
480.0000 mg | Freq: Once | INTRAVENOUS | Status: AC
  Administered 2022-09-10: 480 mg via INTRAVENOUS
  Filled 2022-09-10: qty 48

## 2022-09-10 MED ORDER — ZOLEDRONIC ACID 4 MG/100ML IV SOLN
4.0000 mg | Freq: Once | INTRAVENOUS | Status: AC
  Administered 2022-09-10: 4 mg via INTRAVENOUS
  Filled 2022-09-10: qty 100

## 2022-09-10 MED ORDER — SODIUM CHLORIDE 0.9% FLUSH
10.0000 mL | INTRAVENOUS | Status: DC | PRN
  Administered 2022-09-10: 10 mL

## 2022-09-10 NOTE — Addendum Note (Signed)
Addended by: [name] on: 09/10/2022 09:27 AM   Modules accepted: Orders

## 2022-09-10 NOTE — Patient Instructions (Signed)
Nivolumab Injection What is this medication? NIVOLUMAB (nye VOL ue mab) treats some types of cancer. It works by helping your immune system slow or stop the spread of cancer cells. It is a monoclonal antibody. This medicine may be used for other purposes; ask your health care provider or pharmacist if you have questions. COMMON BRAND NAME(S): Opdivo What should I tell my care team before I take this medication? They need to know if you have any of these conditions: Allogeneic stem cell transplant (uses someone else's stem cells) Autoimmune diseases, such as Crohn disease, ulcerative colitis, lupus History of chest radiation Nervous system problems, such as Guillain-Barre syndrome or myasthenia gravis Organ transplant An unusual or allergic reaction to nivolumab, other medications, foods, dyes, or preservatives Pregnant or trying to get pregnant Breast-feeding How should I use this medication? This medication is infused into a vein. It is given in a hospital or clinic setting. A special MedGuide will be given to you before each treatment. Be sure to read this information carefully each time. Talk to your care team about the use of this medication in children. While it may be prescribed for children as young as 12 years for selected conditions, precautions do apply. Overdosage: If you think you have taken too much of this medicine contact a poison control center or emergency room at once. NOTE: This medicine is only for you. Do not share this medicine with others. What if I miss a dose? Keep appointments for follow-up doses. It is important not to miss your dose. Call your care team if you are unable to keep an appointment. What may interact with this medication? Interactions have not been studied. This list may not describe all possible interactions. Give your health care provider a list of all the medicines, herbs, non-prescription drugs, or dietary supplements you use. Also tell them if  you smoke, drink alcohol, or use illegal drugs. Some items may interact with your medicine. What should I watch for while using this medication? Your condition will be monitored carefully while you are receiving this medication. You may need blood work while taking this medication. This medication may cause serious skin reactions. They can happen weeks to months after starting the medication. Contact your care team right away if you notice fevers or flu-like symptoms with a rash. The rash may be red or purple and then turn into blisters or peeling of the skin. You may also notice a red rash with swelling of the face, lips, or lymph nodes in your neck or under your arms. Tell your care team right away if you have any change in your eyesight. Talk to your care team if you are pregnant or think you might be pregnant. A negative pregnancy test is required before starting this medication. A reliable form of contraception is recommended while taking this medication and for 5 months after the last dose. Talk to your care team about effective forms of contraception. Do not breast-feed while taking this medication and for 5 months after the last dose. What side effects may I notice from receiving this medication? Side effects that you should report to your care team as soon as possible: Allergic reactions--skin rash, itching, hives, swelling of the face, lips, tongue, or throat Dry cough, shortness of breath or trouble breathing Eye pain, redness, irritation, or discharge with blurry or decreased vision Heart muscle inflammation--unusual weakness or fatigue, shortness of breath, chest pain, fast or irregular heartbeat, dizziness, swelling of the ankles, feet, or hands Hormone  gland problems--headache, sensitivity to light, unusual weakness or fatigue, dizziness, fast or irregular heartbeat, increased sensitivity to cold or heat, excessive sweating, constipation, hair loss, increased thirst or amount of urine,  tremors or shaking, irritability Infusion reactions--chest pain, shortness of breath or trouble breathing, feeling faint or lightheaded Kidney injury (glomerulonephritis)--decrease in the amount of urine, red or dark brown urine, foamy or bubbly urine, swelling of the ankles, hands, or feet Liver injury--right upper belly pain, loss of appetite, nausea, light-colored stool, dark yellow or brown urine, yellowing skin or eyes, unusual weakness or fatigue Pain, tingling, or numbness in the hands or feet, muscle weakness, change in vision, confusion or trouble speaking, loss of balance or coordination, trouble walking, seizures Rash, fever, and swollen lymph nodes Redness, blistering, peeling, or loosening of the skin, including inside the mouth Sudden or severe stomach pain, bloody diarrhea, fever, nausea, vomiting Side effects that usually do not require medical attention (report these to your care team if they continue or are bothersome): Bone, joint, or muscle pain Diarrhea Fatigue Loss of appetite Nausea Skin rash This list may not describe all possible side effects. Call your doctor for medical advice about side effects. You may report side effects to FDA at 1-800-FDA-1088. Where should I keep my medication? This medication is given in a hospital or clinic. It will not be stored at home. NOTE: This sheet is a summary. It may not cover all possible information. If you have questions about this medicine, talk to your doctor, pharmacist, or health care provider.  2024 Elsevier/Gold Standard (2021-06-01 00:00:00)  Zoledronic Acid Injection (Cancer) What is this medication? ZOLEDRONIC ACID (ZOE le dron ik AS id) treats high calcium levels in the blood caused by cancer. It may also be used with chemotherapy to treat weakened bones caused by cancer. It works by slowing down the release of calcium from bones. This lowers calcium levels in your blood. It also makes your bones stronger and less  likely to break (fracture). It belongs to a group of medications called bisphosphonates. This medicine may be used for other purposes; ask your health care provider or pharmacist if you have questions. COMMON BRAND NAME(S): Zometa, Zometa Powder What should I tell my care team before I take this medication? They need to know if you have any of these conditions: Dehydration Dental disease Kidney disease Liver disease Low levels of calcium in the blood Lung or breathing disease, such as asthma Receiving steroids, such as dexamethasone or prednisone An unusual or allergic reaction to zoledronic acid, other medications, foods, dyes, or preservatives Pregnant or trying to get pregnant Breast-feeding How should I use this medication? This medication is injected into a vein. It is given by your care team in a hospital or clinic setting. Talk to your care team about the use of this medication in children. Special care may be needed. Overdosage: If you think you have taken too much of this medicine contact a poison control center or emergency room at once. NOTE: This medicine is only for you. Do not share this medicine with others. What if I miss a dose? Keep appointments for follow-up doses. It is important not to miss your dose. Call your care team if you are unable to keep an appointment. What may interact with this medication? Certain antibiotics given by injection Diuretics, such as bumetanide, furosemide NSAIDs, medications for pain and inflammation, such as ibuprofen or naproxen Teriparatide Thalidomide This list may not describe all possible interactions. Give your health care provider  a list of all the medicines, herbs, non-prescription drugs, or dietary supplements you use. Also tell them if you smoke, drink alcohol, or use illegal drugs. Some items may interact with your medicine. What should I watch for while using this medication? Visit your care team for regular checks on your  progress. It may be some time before you see the benefit from this medication. Some people who take this medication have severe bone, joint, or muscle pain. This medication may also increase your risk for jaw problems or a broken thigh bone. Tell your care team right away if you have severe pain in your jaw, bones, joints, or muscles. Tell you care team if you have any pain that does not go away or that gets worse. Tell your dentist and dental surgeon that you are taking this medication. You should not have major dental surgery while on this medication. See your dentist to have a dental exam and fix any dental problems before starting this medication. Take good care of your teeth while on this medication. Make sure you see your dentist for regular follow-up appointments. You should make sure you get enough calcium and vitamin D while you are taking this medication. Discuss the foods you eat and the vitamins you take with your care team. Check with your care team if you have severe diarrhea, nausea, and vomiting, or if you sweat a lot. The loss of too much body fluid may make it dangerous for you to take this medication. You may need bloodwork while taking this medication. Talk to your care team if you wish to become pregnant or think you might be pregnant. This medication can cause serious birth defects. What side effects may I notice from receiving this medication? Side effects that you should report to your care team as soon as possible: Allergic reactions--skin rash, itching, hives, swelling of the face, lips, tongue, or throat Kidney injury--decrease in the amount of urine, swelling of the ankles, hands, or feet Low calcium level--muscle pain or cramps, confusion, tingling, or numbness in the hands or feet Osteonecrosis of the jaw--pain, swelling, or redness in the mouth, numbness of the jaw, poor healing after dental work, unusual discharge from the mouth, visible bones in the mouth Severe bone,  joint, or muscle pain Side effects that usually do not require medical attention (report to your care team if they continue or are bothersome): Constipation Fatigue Fever Loss of appetite Nausea Stomach pain This list may not describe all possible side effects. Call your doctor for medical advice about side effects. You may report side effects to FDA at 1-800-FDA-1088. Where should I keep my medication? This medication is given in a hospital or clinic. It will not be stored at home. NOTE: This sheet is a summary. It may not cover all possible information. If you have questions about this medicine, talk to your doctor, pharmacist, or health care provider.  2024 Elsevier/Gold Standard (2021-03-27 00:00:00)

## 2022-09-10 NOTE — Progress Notes (Signed)
All Hematology and Oncology Follow Up Visit  [name] [name] [name] [name] [id number] 15-May-1945 77 y.o. 09/10/2022   Principle Diagnosis:  Hepatocellular carcinoma-multifocal Hepatitis C   Current Therapy:        Status post cycle 1 of nivolumab/ipilimumab -- d/c on 10/13/2020 due to hepatic toxicity Nivolumab 480 mg IV every 4 weeks - s/p cycle #20 -- started 11/21/2020 --changed to 480 mg on 08/2021 Nexavar 200 mg po BID -- to be taken while in [place] -- start on 02/26/2022 -- d/c on 06/16/2022   Interim History:  [name] is here today for follow-up.  She actually was in the hospital about 3 weeks ago.  She was in because of marked hyponatremia.  A lot of this was secondary to the antifungal that she was taking.  I think she was on voriconazole.  She has a fungal eye infection.  Now, she gets an IV infusion.  I think she will finish this up in 2 weeks.  As far as the liver cancer is concerned, she has done incredibly well with this.  Her last alpha-fetoprotein was 6.3.  She has had no problems with nausea or vomiting.  She has had no change in bowel or bladder habits.  She has had no bleeding.  Is been no leg swelling.  She has had no cough or shortness of breath.  Overall, I would have said that her performance status is ECOG 1.    Medications:  Allergies as of 09/10/2022       Reactions   Bactrim [sulfamethoxazole-trimethoprim] Swelling, Other (See Comments)   Site of swelling not recalled   Clarithromycin Swelling, Rash, Other (See Comments)   Daughter is unsure if Clarithromycin or Amoxicillin caused rash and swelling. Patient states no allergy to amoxicillan - has taken this recently with no issues. (12/02/21)        Medication List        Accurate as of September 10, 2022  8:14 AM. If you have any questions, ask your nurse or doctor.          atropine 1 % ophthalmic solution Place 1 drop into the right eye in the morning and at bedtime.    bacitracin-polymyxin b ophthalmic ointment Commonly known as: POLYSPORIN Apply to affected eye(s) 4 (four) times a day.   Basaglar KwikPen 100 UNIT/ML Inject 12 Units into the skin in the morning.   Basaglar KwikPen 100 UNIT/ML Inject 12 Units into the skin daily.   erythromycin ophthalmic ointment Place  0.5 inch ribbon to the right eye 4 (four) times daily.   HumaLOG KwikPen 200 UNIT/ML KwikPen Generic drug: insulin lispro Max daily dose 100 units. What changed:  how much to take how to take this when to take this additional instructions   lidocaine-prilocaine cream Commonly known as: EMLA Apply 1 Application topically as needed (for port access).   losartan 100 MG tablet Commonly known as: COZAAR Take 1 tablet (100 mg total) by mouth at bedtime.   Neomycin-Bacitracin Zn-Polymyx 3.5-400-10000 Oint Place into the right eye See admin instructions. Apply a 0.25" ribbon to the right eye 4 times a day   NIFEdipine 30 MG 24 hr tablet Commonly known as: PROCARDIA-XL/NIFEDICAL-XL Take 1 tablet (30 mg total) by mouth in the morning and at bedtime.   NONFORMULARY OR COMPOUNDED ITEM Place 1 drop into the right eye See admin instructions. Voriconazole 1% solution- Instill 1 drop into the right eye four times a day   NONFORMULARY OR COMPOUNDED  ITEM Place 1 drop into the right eye See admin instructions. Amphotericin-B liposomal ophthalmic solution 0.15%- Instill 1 drop into the right eye four times a day   pantoprazole 40 MG tablet Commonly known as: Protonix Take 1 tablet (40 mg total) by mouth 2 (two) times daily.   polyethylene glycol powder 17 GM/SCOOP powder Commonly known as: GLYCOLAX/MIRALAX Take 17 g by mouth daily as needed for mild constipation.   predniSONE 5 MG tablet Commonly known as: DELTASONE Take 3.5 tablets (17.5 mg total) by mouth daily for 30 days, THEN 3 tablets (15 mg total) daily for 30 days, THEN 2.5 tablets (12.5 mg total) daily for 30 days, THEN  2.5 tablets (12.5 mg total) daily for 30 days, THEN 2 tablets (10 mg total) daily for 30 days. What changed:  how much to take how to take this when to take this additional instructions   Refresh Tears 0.5 % Soln Generic drug: carboxymethylcellulose Place 1 drop into the right eye every 2 (two) hours.   SSD 1 % cream Generic drug: silver sulfADIAZINE Apply to the open, ulcerated areas of the skin once daily as needed.   TechLite Pen Needles 32G X 4 MM Misc Generic drug: Insulin Pen Needle Use with insulins daily.   True Metrix Blood Glucose Test test strip Generic drug: glucose blood Use as instructed   Voriconazole Powd Place 1 drop into the right eye every 2 (two) hours.        Allergies:  Allergies  Allergen Reactions   Bactrim [Sulfamethoxazole-Trimethoprim] Swelling and Other (See Comments)    Site of swelling not recalled   Clarithromycin Swelling, Rash and Other (See Comments)    Daughter is unsure if Clarithromycin or Amoxicillin caused rash and swelling.  Patient states no allergy to amoxicillan - has taken this recently with no issues. (12/02/21)    Past Medical History, Surgical history, Social history, and Family History were reviewed and updated.  Review of Systems: Review of Systems  Constitutional: Negative.   HENT: Negative.    Eyes: Negative.   Respiratory: Negative.    Cardiovascular: Negative.   Gastrointestinal: Negative.   Genitourinary: Negative.   Musculoskeletal: Negative.   Skin: Negative.   Neurological:  Positive for tingling.  Endo/Heme/Allergies: Negative.   Psychiatric/Behavioral: Negative.       Physical Exam: Vital signs with temperature of 98.5.  Pulse 92.  Blood pressure 163/68.  Weight is 153 pounds.  Wt Readings from Last 3 Encounters:  08/21/22 168 lb 10.4 oz (76.5 kg)  08/13/22 155 lb 12 oz (70.6 kg)  07/16/22 157 lb 1.3 oz (71.3 kg)    Physical Exam Vitals reviewed.  HENT:     Head: Normocephalic and  atraumatic.  Eyes:     Pupils: Pupils are equal, round, and reactive to light.     Comments: Her right eye is a little bit more open.  There is still some erythema with respect to the cornea.   Left eye shows good extraocular muscle movement.  Pupils react.  Cardiovascular:     Rate and Rhythm: Normal rate and regular rhythm.     Heart sounds: Normal heart sounds.  Pulmonary:     Effort: Pulmonary effort is normal.     Breath sounds: Normal breath sounds.  Abdominal:     General: Bowel sounds are normal.     Palpations: Abdomen is soft.     Comments: Abdominal exam shows a well-healed laparotomy scar.  She has no fluid wave.  There is  no guarding or rebound tenderness to palpation.  There is no palpable liver or spleen tip.  Musculoskeletal:        General: No tenderness or deformity. Normal range of motion.     Cervical back: Normal range of motion.  Lymphadenopathy:     Cervical: No cervical adenopathy.  Skin:    General: Skin is warm and dry.     Findings: No erythema or rash.     Comments: Skin exam does show a extensive but somewhat healing papular rash on her lower legs.  This is below the knees.  She has some dressing on the foot on the right foot.  She has no open wounds.  There is no blisters.  Neurological:     Mental Status: She is alert and oriented to person, place, and time.  Psychiatric:        Behavior: Behavior normal.        Thought Content: Thought content normal.        Judgment: Judgment normal.       Lab Results  Component Value Date   WBC 5.9 08/22/2022   HGB 12.1 08/22/2022   HCT 35.3 (L) 08/22/2022   MCV 86.7 08/22/2022   PLT 240 08/22/2022   Lab Results  Component Value Date   FERRITIN 603 (H) 10/02/2020   IRON 31 (L) 10/02/2020   TIBC 241 10/02/2020   UIBC 211 10/02/2020   IRONPCTSAT 13 (L) 10/02/2020   Lab Results  Component Value Date   RBC 4.07 08/22/2022   No results found for: "KPAFRELGTCHN", "LAMBDASER", "KAPLAMBRATIO" No results  found for: "IGGSERUM", "IGA", "IGMSERUM" No results found for: "TOTALPROTELP", "ALBUMINELP", "A1GS", "A2GS", "BETS", "BETA2SER", "GAMS", "MSPIKE", "SPEI"   Chemistry      Component Value Date/Time   NA 133 (L) 08/22/2022 1710   NA 140 01/22/2022 0849   K 4.2 08/22/2022 1710   CL 106 08/22/2022 1710   CO2 21 (L) 08/22/2022 1710   BUN 8 08/22/2022 1710   BUN 11 01/22/2022 0849   CREATININE 0.66 08/22/2022 1710   CREATININE 1.11 (H) 08/13/2022 0823   CREATININE 0.58 (L) 08/20/2021 1541      Component Value Date/Time   CALCIUM 8.9 08/22/2022 1710   ALKPHOS 81 08/22/2022 1710   AST 134 (H) 08/22/2022 1710   AST 16 08/13/2022 0823   ALT 65 (H) 08/22/2022 1710   ALT 23 08/13/2022 0823   BILITOT 0.6 08/22/2022 1710   BILITOT 0.5 08/13/2022 0823       Impression and Plan: [name] [name] is a very pleasant 77 yo female from [place] with hepatocellular carcinoma and Hepatitis C.  She actually has had treatment for the Hepatitis C.  I am sure that this is part of the reason why she has done so well with response.  We have her on the nivolumab.  We do not have to make any changes.  I just feel bad that she has as I infection.  I know that fungal infections, no matter where they are, will take quite a while to improve.  We will still plan to have her come back to see [place] in 4 weeks.     [name], MD 7/26/20248:14 AM

## 2022-09-10 NOTE — Patient Instructions (Signed)

## 2022-09-10 NOTE — Progress Notes (Signed)
CBC and cmet reviewed by MD, ok to proceed despite counts with Opdivo.   Pt will also get Zometa. Calcium 11.5.   Checked with Sigmund Hazel for insurance approval. Per Misty Stanley " 0 auth req- good to proceed "  MD will enter orders.

## 2022-09-13 ENCOUNTER — Encounter: Payer: Self-pay | Admitting: Hematology & Oncology

## 2022-09-13 ENCOUNTER — Other Ambulatory Visit: Payer: Self-pay

## 2022-09-13 MED ORDER — MOXIFLOXACIN HCL 0.5 % OP SOLN
1.0000 [drp] | Freq: Four times a day (QID) | OPHTHALMIC | 3 refills | Status: DC
  Filled 2022-09-13: qty 3, 15d supply, fill #0

## 2022-09-14 ENCOUNTER — Other Ambulatory Visit: Payer: Self-pay

## 2022-09-17 ENCOUNTER — Inpatient Hospital Stay: Payer: Self-pay | Attending: Hematology & Oncology

## 2022-09-17 ENCOUNTER — Inpatient Hospital Stay: Payer: Self-pay

## 2022-09-17 VITALS — BP 149/74 | HR 86 | Temp 98.6°F | Resp 18

## 2022-09-17 DIAGNOSIS — Z95828 Presence of other vascular implants and grafts: Secondary | ICD-10-CM

## 2022-09-17 DIAGNOSIS — B1920 Unspecified viral hepatitis C without hepatic coma: Secondary | ICD-10-CM | POA: Insufficient documentation

## 2022-09-17 DIAGNOSIS — Z5112 Encounter for antineoplastic immunotherapy: Secondary | ICD-10-CM | POA: Insufficient documentation

## 2022-09-17 DIAGNOSIS — E032 Hypothyroidism due to medicaments and other exogenous substances: Secondary | ICD-10-CM

## 2022-09-17 DIAGNOSIS — C220 Liver cell carcinoma: Secondary | ICD-10-CM | POA: Insufficient documentation

## 2022-09-17 DIAGNOSIS — B182 Chronic viral hepatitis C: Secondary | ICD-10-CM

## 2022-09-17 LAB — CMP (CANCER CENTER ONLY)
ALT: 38 U/L (ref 0–44)
AST: 30 U/L (ref 15–41)
Albumin: 3.9 g/dL (ref 3.5–5.0)
Alkaline Phosphatase: 115 U/L (ref 38–126)
Anion gap: 5 (ref 5–15)
BUN: 12 mg/dL (ref 8–23)
CO2: 25 mmol/L (ref 22–32)
Calcium: 9.5 mg/dL (ref 8.9–10.3)
Chloride: 109 mmol/L (ref 98–111)
Creatinine: 0.66 mg/dL (ref 0.44–1.00)
GFR, Estimated: >60 mL/min (ref >60)
Glucose, Bld: 86 mg/dL (ref 70–99)
Potassium: 3.7 mmol/L (ref 3.5–5.1)
Sodium: 139 mmol/L (ref 135–145)
Total Bilirubin: 0.5 mg/dL (ref 0.3–1.2)
Total Protein: 7.1 g/dL (ref 6.5–8.1)

## 2022-09-17 MED ORDER — HEPARIN SOD (PORK) LOCK FLUSH 100 UNIT/ML IV SOLN
500.0000 [IU] | Freq: Once | INTRAVENOUS | Status: AC
  Administered 2022-09-17: 500 [IU] via INTRAVENOUS

## 2022-09-17 MED ORDER — SODIUM CHLORIDE 0.9% FLUSH
10.0000 mL | INTRAVENOUS | Status: DC | PRN
  Administered 2022-09-17: 10 mL via INTRAVENOUS

## 2022-09-20 ENCOUNTER — Encounter: Payer: Self-pay | Admitting: Hematology & Oncology

## 2022-09-20 ENCOUNTER — Other Ambulatory Visit (HOSPITAL_COMMUNITY): Payer: Self-pay

## 2022-09-21 ENCOUNTER — Other Ambulatory Visit: Payer: Self-pay

## 2022-09-22 ENCOUNTER — Other Ambulatory Visit: Payer: Self-pay

## 2022-09-23 ENCOUNTER — Other Ambulatory Visit: Payer: Self-pay

## 2022-09-24 ENCOUNTER — Encounter: Payer: Self-pay | Admitting: Hematology & Oncology

## 2022-09-24 ENCOUNTER — Other Ambulatory Visit: Payer: Self-pay

## 2022-09-24 ENCOUNTER — Other Ambulatory Visit (HOSPITAL_COMMUNITY): Payer: Self-pay

## 2022-09-25 ENCOUNTER — Other Ambulatory Visit (HOSPITAL_COMMUNITY): Payer: Self-pay

## 2022-09-27 ENCOUNTER — Ambulatory Visit: Payer: No Typology Code available for payment source | Admitting: Internal Medicine

## 2022-10-04 ENCOUNTER — Other Ambulatory Visit: Payer: Self-pay

## 2022-10-04 ENCOUNTER — Encounter: Payer: Self-pay | Admitting: Hematology & Oncology

## 2022-10-05 ENCOUNTER — Encounter: Payer: Self-pay | Admitting: Hematology & Oncology

## 2022-10-06 ENCOUNTER — Other Ambulatory Visit: Payer: Self-pay

## 2022-10-07 ENCOUNTER — Encounter: Payer: Self-pay | Admitting: Hematology & Oncology

## 2022-10-07 ENCOUNTER — Other Ambulatory Visit: Payer: Self-pay

## 2022-10-07 MED ORDER — OXYCODONE-ACETAMINOPHEN 5-325 MG PO TABS
1.0000 | ORAL_TABLET | ORAL | 0 refills | Status: DC | PRN
  Filled 2022-10-07: qty 40, 7d supply, fill #0

## 2022-10-07 NOTE — Progress Notes (Signed)
She developed hypercalcemia.  As such, Zometa is necessary for this.

## 2022-10-08 ENCOUNTER — Other Ambulatory Visit: Payer: Self-pay

## 2022-10-08 ENCOUNTER — Encounter: Payer: Self-pay | Admitting: Hematology & Oncology

## 2022-10-11 ENCOUNTER — Inpatient Hospital Stay: Payer: No Typology Code available for payment source

## 2022-10-11 ENCOUNTER — Other Ambulatory Visit: Payer: Self-pay

## 2022-10-11 ENCOUNTER — Inpatient Hospital Stay (HOSPITAL_BASED_OUTPATIENT_CLINIC_OR_DEPARTMENT_OTHER): Payer: No Typology Code available for payment source | Admitting: Hematology & Oncology

## 2022-10-11 ENCOUNTER — Encounter: Payer: Self-pay | Admitting: Hematology & Oncology

## 2022-10-11 VITALS — BP 149/79 | HR 78 | Temp 98.4°F | Resp 20 | Ht 64.0 in | Wt 151.0 lb

## 2022-10-11 DIAGNOSIS — C220 Liver cell carcinoma: Secondary | ICD-10-CM

## 2022-10-11 LAB — CMP (CANCER CENTER ONLY)
ALT: 17 U/L (ref 0–44)
AST: 19 U/L (ref 15–41)
Albumin: 3.9 g/dL (ref 3.5–5.0)
Alkaline Phosphatase: 107 U/L (ref 38–126)
Anion gap: 8 (ref 5–15)
BUN: 11 mg/dL (ref 8–23)
CO2: 24 mmol/L (ref 22–32)
Calcium: 9.8 mg/dL (ref 8.9–10.3)
Chloride: 104 mmol/L (ref 98–111)
Creatinine: 0.6 mg/dL (ref 0.44–1.00)
GFR, Estimated: >60 mL/min (ref >60)
Glucose, Bld: 155 mg/dL — ABNORMAL HIGH (ref 70–99)
Potassium: 3.9 mmol/L (ref 3.5–5.1)
Sodium: 136 mmol/L (ref 135–145)
Total Bilirubin: 0.6 mg/dL (ref 0.3–1.2)
Total Protein: 6.8 g/dL (ref 6.5–8.1)

## 2022-10-11 LAB — CBC WITH DIFFERENTIAL (CANCER CENTER ONLY)
Abs Immature Granulocytes: 0 10*3/uL (ref 0.00–0.07)
Basophils Absolute: 0 10*3/uL (ref 0.0–0.1)
Basophils Relative: 1 %
Eosinophils Absolute: 0.1 10*3/uL (ref 0.0–0.5)
Eosinophils Relative: 1 %
HCT: 37.6 % (ref 36.0–46.0)
Hemoglobin: 12.5 g/dL (ref 12.0–15.0)
Immature Granulocytes: 0 %
Lymphocytes Relative: 43 %
Lymphs Abs: 1.7 10*3/uL (ref 0.7–4.0)
MCH: 30 pg (ref 26.0–34.0)
MCHC: 33.2 g/dL (ref 30.0–36.0)
MCV: 90.2 fL (ref 80.0–100.0)
Monocytes Absolute: 0.3 10*3/uL (ref 0.1–1.0)
Monocytes Relative: 9 %
Neutro Abs: 1.8 10*3/uL (ref 1.7–7.7)
Neutrophils Relative %: 46 %
Platelet Count: 223 10*3/uL (ref 150–400)
RBC: 4.17 MIL/uL (ref 3.87–5.11)
RDW: 15.8 % — ABNORMAL HIGH (ref 11.5–15.5)
WBC Count: 3.9 10*3/uL — ABNORMAL LOW (ref 4.0–10.5)
nRBC: 0 % (ref 0.0–0.2)

## 2022-10-11 MED ORDER — SODIUM CHLORIDE 0.9% FLUSH
10.0000 mL | INTRAVENOUS | Status: DC | PRN
  Administered 2022-10-11: 10 mL

## 2022-10-11 MED ORDER — HEPARIN SOD (PORK) LOCK FLUSH 100 UNIT/ML IV SOLN
500.0000 [IU] | Freq: Once | INTRAVENOUS | Status: AC | PRN
  Administered 2022-10-11: 500 [IU]

## 2022-10-11 MED ORDER — SODIUM CHLORIDE 0.9 % IV SOLN
480.0000 mg | Freq: Once | INTRAVENOUS | Status: AC
  Administered 2022-10-11: 480 mg via INTRAVENOUS
  Filled 2022-10-11: qty 48

## 2022-10-11 MED ORDER — GABAPENTIN 300 MG PO CAPS
300.0000 mg | ORAL_CAPSULE | Freq: Three times a day (TID) | ORAL | 4 refills | Status: AC
  Filled 2022-10-11: qty 90, 30d supply, fill #0
  Filled 2022-11-11: qty 90, 30d supply, fill #1
  Filled 2023-01-12: qty 90, 30d supply, fill #2
  Filled 2023-02-07: qty 90, 30d supply, fill #3

## 2022-10-11 MED ORDER — SODIUM CHLORIDE 0.9 % IV SOLN: Freq: Once | INTRAVENOUS | Status: AC

## 2022-10-11 NOTE — Patient Instructions (Signed)

## 2022-10-11 NOTE — Patient Instructions (Signed)
Nivolumab Injection What is this medication? NIVOLUMAB (nye VOL ue mab) treats some types of cancer. It works by helping your immune system slow or stop the spread of cancer cells. It is a monoclonal antibody. This medicine may be used for other purposes; ask your health care provider or pharmacist if you have questions. COMMON BRAND NAME(S): Opdivo What should I tell my care team before I take this medication? They need to know if you have any of these conditions: Allogeneic stem cell transplant (uses someone else's stem cells) Autoimmune diseases, such as Crohn disease, ulcerative colitis, lupus History of chest radiation Nervous system problems, such as Guillain-Barre syndrome or myasthenia gravis Organ transplant An unusual or allergic reaction to nivolumab, other medications, foods, dyes, or preservatives Pregnant or trying to get pregnant Breast-feeding How should I use this medication? This medication is infused into a vein. It is given in a hospital or clinic setting. A special MedGuide will be given to you before each treatment. Be sure to read this information carefully each time. Talk to your care team about the use of this medication in children. While it may be prescribed for children as young as 12 years for selected conditions, precautions do apply. Overdosage: If you think you have taken too much of this medicine contact a poison control center or emergency room at once. NOTE: This medicine is only for you. Do not share this medicine with others. What if I miss a dose? Keep appointments for follow-up doses. It is important not to miss your dose. Call your care team if you are unable to keep an appointment. What may interact with this medication? Interactions have not been studied. This list may not describe all possible interactions. Give your health care provider a list of all the medicines, herbs, non-prescription drugs, or dietary supplements you use. Also tell them if you  smoke, drink alcohol, or use illegal drugs. Some items may interact with your medicine. What should I watch for while using this medication? Your condition will be monitored carefully while you are receiving this medication. You may need blood work while taking this medication. This medication may cause serious skin reactions. They can happen weeks to months after starting the medication. Contact your care team right away if you notice fevers or flu-like symptoms with a rash. The rash may be red or purple and then turn into blisters or peeling of the skin. You may also notice a red rash with swelling of the face, lips, or lymph nodes in your neck or under your arms. Tell your care team right away if you have any change in your eyesight. Talk to your care team if you are pregnant or think you might be pregnant. A negative pregnancy test is required before starting this medication. A reliable form of contraception is recommended while taking this medication and for 5 months after the last dose. Talk to your care team about effective forms of contraception. Do not breast-feed while taking this medication and for 5 months after the last dose. What side effects may I notice from receiving this medication? Side effects that you should report to your care team as soon as possible: Allergic reactions--skin rash, itching, hives, swelling of the face, lips, tongue, or throat Dry cough, shortness of breath or trouble breathing Eye pain, redness, irritation, or discharge with blurry or decreased vision Heart muscle inflammation--unusual weakness or fatigue, shortness of breath, chest pain, fast or irregular heartbeat, dizziness, swelling of the ankles, feet, or hands Hormone   gland problems--headache, sensitivity to light, unusual weakness or fatigue, dizziness, fast or irregular heartbeat, increased sensitivity to cold or heat, excessive sweating, constipation, hair loss, increased thirst or amount of urine,  tremors or shaking, irritability Infusion reactions--chest pain, shortness of breath or trouble breathing, feeling faint or lightheaded Kidney injury (glomerulonephritis)--decrease in the amount of urine, red or dark brown urine, foamy or bubbly urine, swelling of the ankles, hands, or feet Liver injury--right upper belly pain, loss of appetite, nausea, light-colored stool, dark yellow or brown urine, yellowing skin or eyes, unusual weakness or fatigue Pain, tingling, or numbness in the hands or feet, muscle weakness, change in vision, confusion or trouble speaking, loss of balance or coordination, trouble walking, seizures Rash, fever, and swollen lymph nodes Redness, blistering, peeling, or loosening of the skin, including inside the mouth Sudden or severe stomach pain, bloody diarrhea, fever, nausea, vomiting Side effects that usually do not require medical attention (report these to your care team if they continue or are bothersome): Bone, joint, or muscle pain Diarrhea Fatigue Loss of appetite Nausea Skin rash This list may not describe all possible side effects. Call your doctor for medical advice about side effects. You may report side effects to FDA at 1-800-FDA-1088. Where should I keep my medication? This medication is given in a hospital or clinic. It will not be stored at home. NOTE: This sheet is a summary. It may not cover all possible information. If you have questions about this medicine, talk to your doctor, pharmacist, or health care provider.  2024 Elsevier/Gold Standard (2021-06-01 00:00:00)  

## 2022-10-11 NOTE — Progress Notes (Signed)
All Hematology and Oncology Follow Up Visit  [name] [name] [id number] [birth year] 77 y.o. 10/11/2022   Principle Diagnosis:  Hepatocellular carcinoma-multifocal Hepatitis C   Current Therapy:        Status post cycle 1 of nivolumab/ipilimumab -- d/c on 10/13/2020 due to hepatic toxicity Nivolumab 480 mg IV every 4 weeks - s/p cycle #21 -- started 11/21/2020 --changed to 480 mg on 08/2021 Nexavar 200 mg po BID -- to be taken while in [place] -- start on 02/26/2022 -- d/c on 06/16/2022   Interim History:  [name] [name] is here today for follow-up.  Unfortunately, the real problem is still with this fungal infection of the right eye.  She had been on systemic antifungal agents.  It appears that this may not been all that effective.  I know she has been seeing Infectious Disease.  I think she sees Ophthalmology today.  It sounds like she is having a lot of pain.  She is on oxycodone.  She takes it every 4 hours.  I will see if this may be gabapentin might help.  I do not see a downside to using gabapentin.  Hopefully, she will not have to lose the eye.  As far as the hepatocellular carcinoma, this really is not a problem.  Her last alpha-fetoprotein was 6.5.[name]  She has had no problems with thyroid.  We will see what her TSH is.  She has had no problems with bowels or bladder.  Her blood sugars seem to be doing a little bit better.  Overall, I would say that her performance status is probably ECOG 1.    Medications:  Allergies as of 10/11/2022       Reactions   Bactrim [sulfamethoxazole-trimethoprim] Swelling, Other (See Comments)   Site of swelling not recalled   Clarithromycin Swelling, Rash, Other (See Comments)   Daughter is unsure if Clarithromycin or Amoxicillin caused rash and swelling. Patient states no allergy to amoxicillan - has taken this recently with no issues. (12/02/21)        Medication List        Accurate as of October 11, 2022  8:29  AM. If you have any questions, ask your nurse or doctor.          STOP taking these medications    atropine 1 % ophthalmic solution Stopped by: [name]   Neomycin-Bacitracin Zn-Polymyx 3.5-400-10000 Oint Stopped by: [name]   NONFORMULARY OR COMPOUNDED ITEM Stopped by: [name]   NONFORMULARY OR COMPOUNDED ITEM Stopped by: [name]   predniSONE 5 MG tablet Commonly known as: DELTASONE Stopped by: [name]   SSD 1 % cream Generic drug: silver sulfADIAZINE Stopped by: [name]   Voriconazole Powd Stopped by: [name]       TAKE these medications    Basaglar KwikPen 100 UNIT/ML Inject 12 Units into the skin in the morning.   Basaglar KwikPen 100 UNIT/ML Inject 12 Units into the skin daily.   brimonidine 0.15 % ophthalmic solution Commonly known as: ALPHAGAN Place into the left eye 2 (two) times daily.   erythromycin ophthalmic ointment Place  0.5 inch ribbon to the right eye 4 (four) times daily.   HumaLOG KwikPen 200 UNIT/ML KwikPen Generic drug: insulin lispro Max daily dose 100 units. What changed:  how much to take how to take this when to take this additional instructions   lidocaine-prilocaine cream Commonly known as: EMLA Apply 1 Application  topically as needed (for port access).   losartan 100 MG tablet Commonly known as: COZAAR Take 1 tablet (100 mg total) by mouth at bedtime.   NIFEdipine 30 MG 24 hr tablet Commonly known as: PROCARDIA-XL/NIFEDICAL-XL Take 1 tablet (30 mg total) by mouth in the morning and at bedtime.   oxyCODONE-acetaminophen 5-325 MG tablet Commonly known as: PERCOCET/ROXICET Take 1 tablet by mouth every 4 (four) hours as needed for moderate pain (4-6).   pantoprazole 40 MG tablet Commonly known as: Protonix Take 1 tablet (40 mg total) by mouth 2 (two) times daily.   polyethylene glycol powder 17 GM/SCOOP powder Commonly known as: GLYCOLAX/MIRALAX Take 17 g by  mouth daily as needed for mild constipation.   Refresh Tears 0.5 % Soln Generic drug: carboxymethylcellulose Place 1 drop into the right eye every 2 (two) hours.   TechLite Pen Needles 32G X 4 MM Misc Generic drug: Insulin Pen Needle Use with insulins daily.   True Metrix Blood Glucose Test test strip Generic drug: glucose blood Use as instructed        Allergies:  Allergies  Allergen Reactions   Bactrim [Sulfamethoxazole-Trimethoprim] Swelling and Other (See Comments)    Site of swelling not recalled   Clarithromycin Swelling, Rash and Other (See Comments)    Daughter is unsure if Clarithromycin or Amoxicillin caused rash and swelling.  Patient states no allergy to amoxicillan - has taken this recently with no issues. (12/02/21)    Past Medical History, Surgical history, Social history, and Family History were reviewed and updated.  Review of Systems: Review of Systems  Constitutional: Negative.   HENT: Negative.    Eyes: Negative.   Respiratory: Negative.    Cardiovascular: Negative.   Gastrointestinal: Negative.   Genitourinary: Negative.   Musculoskeletal: Negative.   Skin: Negative.   Neurological:  Positive for tingling.  Endo/Heme/Allergies: Negative.   Psychiatric/Behavioral: Negative.       Physical Exam: Vital signs with temperature of 98.5.  Pulse 92.  Blood pressure 163/68.  Weight is 153 pounds.  Wt Readings from Last 3 Encounters:  10/11/22 151 lb (68.5 kg)  09/10/22 153 lb (69.4 kg)  08/21/22 168 lb 10.4 oz (76.5 kg)    Physical Exam Vitals reviewed.  HENT:     Head: Normocephalic and atraumatic.  Eyes:     Pupils: Pupils are equal, round, and reactive to light.     Comments: Her right eye is closed    Cardiovascular:     Rate and Rhythm: Normal rate and regular rhythm.     Heart sounds: Normal heart sounds.  Pulmonary:     Effort: Pulmonary effort is normal.     Breath sounds: Normal breath sounds.  Abdominal:     General: Bowel  sounds are normal.     Palpations: Abdomen is soft.     Comments: Abdominal exam shows a well-healed laparotomy scar.  She has no fluid wave.  There is no guarding or rebound tenderness to palpation.  There is no palpable liver or spleen tip.  Musculoskeletal:        General: No tenderness or deformity. Normal range of motion.     Cervical back: Normal range of motion.  Lymphadenopathy:     Cervical: No cervical adenopathy.  Skin:    General: Skin is warm and dry.     Findings: No erythema or rash.     Comments: Skin exam does show a extensive but somewhat healing papular rash on her lower legs.  This  is below the knees.  She has some dressing on the foot on the right foot.  She has no open wounds.  There is no blisters.  Neurological:     Mental Status: She is alert and oriented to person, place, and time.  Psychiatric:        Behavior: Behavior normal.        Thought Content: Thought content normal.        Judgment: Judgment normal.       Lab Results  Component Value Date   WBC 3.9 (L) 10/11/2022   HGB 12.5 10/11/2022   HCT 37.6 10/11/2022   MCV 90.2 10/11/2022   PLT 223 10/11/2022   Lab Results  Component Value Date   FERRITIN 603 (H) 10/02/2020   IRON 31 (L) 10/02/2020   TIBC 241 10/02/2020   UIBC 211 10/02/2020   IRONPCTSAT 13 (L) 10/02/2020   Lab Results  Component Value Date   RBC 4.17 10/11/2022   No results found for: "KPAFRELGTCHN", "LAMBDASER", "KAPLAMBRATIO" No results found for: "IGGSERUM", "IGA", "IGMSERUM" No results found for: "TOTALPROTELP", "ALBUMINELP", "A1GS", "A2GS", "BETS", "BETA2SER", "GAMS", "MSPIKE", "SPEI"   Chemistry      Component Value Date/Time   NA 139 09/17/2022 1120   NA 140 01/22/2022 0849   K 3.7 09/17/2022 1120   CL 109 09/17/2022 1120   CO2 25 09/17/2022 1120   BUN 12 09/17/2022 1120   BUN 11 01/22/2022 0849   CREATININE 0.66 09/17/2022 1120   CREATININE 0.58 (L) 08/20/2021 1541      Component Value Date/Time   CALCIUM  9.5 09/17/2022 1120   ALKPHOS 115 09/17/2022 1120   AST 30 09/17/2022 1120   ALT 38 09/17/2022 1120   BILITOT 0.5 09/17/2022 1120       Impression and Plan: [name] is a very pleasant 77 yo female from [place] with hepatocellular carcinoma and Hepatitis C.  She actually has had treatment for the Hepatitis C.  I am sure that this is part of the reason why she has done so well with response.  We have her on the nivolumab.  We do not have to make any changes.  We will set her up with an MRI in about 3 weeks.  We will see what this shows.  Again, I really hope she does not have to lose the right eye.  Maybe, the gabapentin will help with some of the pain.  I know the she has been followed by great specialists who will do their best to try to help her right eye.  We will still plan to have her come back to see [place] in 4 weeks.     [name], MD 8/26/20248:29 AM

## 2022-10-12 ENCOUNTER — Encounter: Payer: Self-pay | Admitting: Hematology & Oncology

## 2022-10-12 ENCOUNTER — Other Ambulatory Visit: Payer: Self-pay | Admitting: Internal Medicine

## 2022-10-12 ENCOUNTER — Other Ambulatory Visit: Payer: Self-pay

## 2022-10-12 DIAGNOSIS — E1159 Type 2 diabetes mellitus with other circulatory complications: Secondary | ICD-10-CM

## 2022-10-12 LAB — AFP TUMOR MARKER: AFP, Serum, Tumor Marker: 6.2 ng/mL (ref 0.0–9.2)

## 2022-10-12 MED ORDER — PREDNISOLONE ACETATE 1 % OP SUSP
1.0000 [drp] | Freq: Two times a day (BID) | OPHTHALMIC | 4 refills | Status: DC
  Filled 2022-10-12: qty 5, 50d supply, fill #0

## 2022-10-12 MED ORDER — BACITRACIN-POLYMYXIN B 500-10000 UNIT/GM OP OINT
TOPICAL_OINTMENT | OPHTHALMIC | 11 refills | Status: DC
  Filled 2022-10-12 – 2022-10-28 (×2): qty 3.5, 10d supply, fill #0

## 2022-10-12 MED ORDER — PANTOPRAZOLE SODIUM 40 MG PO TBEC
40.0000 mg | DELAYED_RELEASE_TABLET | Freq: Two times a day (BID) | ORAL | 0 refills | Status: DC
  Filled 2022-10-12: qty 240, 120d supply, fill #0

## 2022-10-12 MED ORDER — LOSARTAN POTASSIUM 100 MG PO TABS
100.0000 mg | ORAL_TABLET | Freq: Every day | ORAL | 0 refills | Status: DC
Start: 2022-10-12 — End: 2022-10-29
  Filled 2022-10-12: qty 120, 120d supply, fill #0

## 2022-10-13 ENCOUNTER — Other Ambulatory Visit: Payer: Self-pay

## 2022-10-14 ENCOUNTER — Other Ambulatory Visit: Payer: Self-pay

## 2022-10-15 ENCOUNTER — Other Ambulatory Visit: Payer: Self-pay

## 2022-10-18 ENCOUNTER — Encounter: Payer: Self-pay | Admitting: Internal Medicine

## 2022-10-18 ENCOUNTER — Encounter: Payer: Self-pay | Admitting: Hematology & Oncology

## 2022-10-19 ENCOUNTER — Encounter: Payer: Self-pay | Admitting: Hematology & Oncology

## 2022-10-19 ENCOUNTER — Other Ambulatory Visit: Payer: Self-pay

## 2022-10-20 ENCOUNTER — Encounter: Payer: Self-pay | Admitting: *Deleted

## 2022-10-20 ENCOUNTER — Ambulatory Visit (HOSPITAL_BASED_OUTPATIENT_CLINIC_OR_DEPARTMENT_OTHER)
Admission: RE | Admit: 2022-10-20 | Discharge: 2022-10-20 | Disposition: A | Discharge status: Home / Self Care | Payer: No Typology Code available for payment source | Source: Ambulatory Visit | Attending: Hematology & Oncology | Admitting: Hematology & Oncology

## 2022-10-20 ENCOUNTER — Other Ambulatory Visit: Payer: Self-pay | Admitting: Internal Medicine

## 2022-10-20 DIAGNOSIS — H44001 Unspecified purulent endophthalmitis, right eye: Secondary | ICD-10-CM

## 2022-10-20 DIAGNOSIS — C220 Liver cell carcinoma: Secondary | ICD-10-CM

## 2022-10-20 MED ORDER — GADOBUTROL 1 MMOL/ML IV SOLN
6.8000 mL | Freq: Once | INTRAVENOUS | Status: AC | PRN
  Administered 2022-10-20: 6.8 mL via INTRAVENOUS

## 2022-10-22 ENCOUNTER — Encounter: Payer: Self-pay | Admitting: Pharmacist

## 2022-10-25 ENCOUNTER — Other Ambulatory Visit: Payer: Self-pay

## 2022-10-25 ENCOUNTER — Telehealth: Payer: Self-pay

## 2022-10-25 NOTE — Telephone Encounter (Signed)
Copied from CRM [id number]. Topic: General - Other >> Oct 25, 2022  1:34 PM Dondra Prader E wrote: Reason for CRM: Brooke with [facility] program called to speak to the clinic about the patient potentially receiving a mammogram. They state that they usually do not recommended patients at her age to have mammograms. They want to know if PCP would recommend  [phone]

## 2022-10-25 NOTE — Telephone Encounter (Signed)
Copied from CRM [id number]. Topic: Referral - Status >> Oct 25, 2022 11:42 AM Phill Myron wrote: Faith of Dr Jillyn Hidden Rankin's office, [facility] is not in network.

## 2022-10-27 ENCOUNTER — Encounter: Payer: Self-pay | Admitting: Hematology & Oncology

## 2022-10-27 NOTE — Telephone Encounter (Signed)
Called & spoke to [place] at [facility]. Informed [name] [name] that the mammogram can be suspended due to patient's age. Brooke expressed verbal understanding and stated that she has also received a message from Dr.Johnson confirming the same message. No further questions at this time.

## 2022-10-28 ENCOUNTER — Other Ambulatory Visit: Payer: Self-pay

## 2022-10-28 ENCOUNTER — Encounter: Payer: Self-pay | Admitting: Hematology & Oncology

## 2022-10-28 ENCOUNTER — Telehealth: Payer: Self-pay

## 2022-10-28 ENCOUNTER — Ambulatory Visit: Payer: MEDICAID | Attending: Internal Medicine | Admitting: Internal Medicine

## 2022-10-28 VITALS — BP 138/78 | HR 95 | Temp 98.3°F | Ht 64.0 in | Wt 154.0 lb

## 2022-10-28 DIAGNOSIS — E1142 Type 2 diabetes mellitus with diabetic polyneuropathy: Secondary | ICD-10-CM

## 2022-10-28 DIAGNOSIS — Z7984 Long term (current) use of oral hypoglycemic drugs: Secondary | ICD-10-CM

## 2022-10-28 DIAGNOSIS — H44131 Sympathetic uveitis, right eye: Secondary | ICD-10-CM

## 2022-10-28 DIAGNOSIS — I152 Hypertension secondary to endocrine disorders: Secondary | ICD-10-CM

## 2022-10-28 DIAGNOSIS — E1159 Type 2 diabetes mellitus with other circulatory complications: Secondary | ICD-10-CM

## 2022-10-28 DIAGNOSIS — L280 Lichen simplex chronicus: Secondary | ICD-10-CM

## 2022-10-28 DIAGNOSIS — Z23 Encounter for immunization: Secondary | ICD-10-CM

## 2022-10-28 DIAGNOSIS — Z1231 Encounter for screening mammogram for malignant neoplasm of breast: Secondary | ICD-10-CM

## 2022-10-28 LAB — POCT GLYCOSYLATED HEMOGLOBIN (HGB A1C): HbA1c, POC (controlled diabetic range): 6.3 % (ref 0.0–7.0)

## 2022-10-28 LAB — GLUCOSE, POCT (MANUAL RESULT ENTRY): POC Glucose: 142 mg/dL — AB (ref 70–99)

## 2022-10-28 NOTE — Telephone Encounter (Signed)
At the request of Dr Laural Benes, I met with the patient and her daughter, Hector Brunswick, when they were in the clinic today.  Blandine explained that Dr Sherryll Burger referred her mother to [facility] for consult for removal of patient's eye.  They went to [facility] and paid $200 for the consult.  The patient is uninsured and does not qualify for full Medicaid at this time. She has a green card and Visa but has only been in the [place] 3 years, not 5 years that she would need to qualify for Medicaid. Blandine said her mother had emergency Medicaid when she was hospitalized and the cancer center has been helping with the bills related to her cancer care. Her mother has also been approved for [facility] financial assistance but Hector Brunswick is not sure if it is still active.  She said at one time her mother owed $70,000 to [facility]  but emergency Medicaid and [facility] financial assistance paid for all but about $3000.  She explained that [facility] would require payment of front for their services and it would be thousands of dollars which they cannot afford.  Her mother is in a great deal of pain from her eye and is anxious to have this pain addressed.   After discussing various options, Blandine will check with [facility] to see if her mother is still approved for financial assistance.  She will contact Dr Sherryll Burger and inquire if there are other options for specialists in the [facility] that could provide the care her mother needs. She also needs to check with Medicaid to see if any of the procedures would be covered by emergency Medicaid.  Blandine also understands that referrals to [facility]  or [facility] could be an option as they also offer financial assistance and may have the specialist that her mother needs.  Blandine said she would follow up and I told her to please call me with any questions.

## 2022-10-28 NOTE — Progress Notes (Signed)
Patient ID: [name] [name], female    DOB: 09/10/1945  MRN: [id number]  CC: Diabetes (DM f/u.  Franchot Erichsen R eye - dauhter states that eye will be removed due to pain. Valentino Hue to flu vax.//)   Subjective: [name] [name] is a 77 y.o. female who presents for chronic ds management. 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    Endophthalmitis: Since last visit with me, patient was hospitalized at [facility] for ophthalmitis in the right eye.  Despite antibiotics including Amphotericin for fungus, recovery has been poor.  Dr. Sherryll Burger stated that there is nothing further he can do to save the eye.  Patient initially did not want to go that route but she has been having tremendous pain in the eye.  Not able to sleep well due to pain.  She has been taking oxycodone alternating with Tylenol every 4 hours.  She has been referred to [facility] to have the eye removed.  She has a telehealth visit with them tomorrow.  Daughter is concerned about the cost for the surgery.  She was approved for emergency Medicaid during hospitalization.  DM: Results for orders placed or performed in visit on 10/28/22  POCT glycosylated hemoglobin (Hb A1C)  Result Value Ref Range   Hemoglobin A1C     HbA1c POC (<> result, manual entry)     HbA1c, POC (prediabetic range)     HbA1c, POC (controlled diabetic range) 6.3 0.0 - 7.0 %  POCT glucose (manual entry)  Result Value Ref Range   POC Glucose 142 (A) 70 - 99 mg/dl  -She continues to take Lantus insulin 12 units daily and Humalog 12 units in the mornings with sliding scale.  Followed by Dr. Brooks Sailors.  Doing good with eating habits. HTN: Currently on Cozaar 100 mg daily and Procardia  30 mg twice a day.  Blood pressures have been running a little higher at home.  Thinks this is due to the pain that she has been experiencing in the eye.  Recent blood pressure readings on her device are 126/74, 137/85, 136/79. 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  Has a few elevated lesions on both lower extremities that appeared when she got to 10 mg of prednisone.  However they remained stable and have not progressed since being weaned off prednisone.  She has not followed up with dermatology because she has been dealing with the issue with her eye.  HM: Agreeable to receiving flu vaccine today.  I was contacted by [facility] program that the daughter had reached out to them requesting a mammogram for the patient.  Last mammogram was in January of last year and was normal.  That was the first and only mammogram she has had in her lifetime.  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   Hypercalcemia 09/22/2020   Chronic hepatitis  C with hepatic coma (HCC) 09/22/2020   Goals of care, counseling/discussion 09/01/2020   Hepatocellular carcinoma (HCC) 08/14/2020   Hyperglycemia 08/10/2020   Pneumoperitoneum 08/09/2020   History of colorectal cancer 08/09/2020   Liver mass, left lobe 08/09/2020   Insulin-requiring or dependent type II diabetes mellitus (HCC) 08/09/2020   Hypertension associated with diabetes (HCC) 08/09/2020   Constipation, chronic 08/09/2020   Perforated gastric ulcer s/p lap omental Cheree Ditto patch 08/09/2020 08/09/2020     Current  Outpatient Medications on File Prior to Visit  Medication Sig Dispense Refill   bacitracin-polymyxin b (POLYSPORIN) ophthalmic ointment Apply 0.5 inch ribbon to affected eye(s) 4 (four) times a day. 3.5 g 11   brimonidine (ALPHAGAN) 0.15 % ophthalmic solution Place into the left eye 2 (two) times daily.     erythromycin ophthalmic ointment Place  0.5 inch ribbon to the right eye 4 (four) times daily. 3.5 g 5   gabapentin (NEURONTIN) 300 MG capsule Take 1 capsule (300 mg total) by mouth 3 (three) times daily. 90 capsule 4   glucose blood (TRUE METRIX BLOOD GLUCOSE TEST) test strip Use as instructed 100 each 12   Insulin Glargine (BASAGLAR KWIKPEN) 100 UNIT/ML Inject 12 Units into the skin in the morning.     insulin glargine (LANTUS SOLOSTAR) 100 UNIT/ML Solostar Pen Inject 12 Units into the skin daily. (Patient not taking: Reported on 08/20/2022) 15 mL 6   insulin lispro (HUMALOG KWIKPEN) 200 UNIT/ML KwikPen Max daily dose 100 units. (Patient taking differently: Inject 12 Units into the skin See admin instructions. Inject 12 units into the skin three times a day before meals, plus a sliding scale for a BGL above 155. Taper as directed by MD [name]. Max daily dose is 100 units. BGL <155 = give nothing; 156-180 = 1 unit; 181-205 = 2 units; 206-230 = 3 units; 231-255 = 4 units; 256-280 = 5 units; 281-305 = 6 units; 306-330 = 7 units; 331-355 = 8 units; 256-380 = 9 units. Each time Prednisone is decreased, please decrease Humalog by 4 units with each meal.) 60 mL 0   Insulin Pen Needle 32G X 4 MM MISC Use with insulins daily. 400 each 3   lidocaine-prilocaine (EMLA) cream Apply 1 Application topically as needed (for port access). (Patient not taking: Reported on 10/11/2022)     oxyCODONE-acetaminophen (PERCOCET/ROXICET) 5-325 MG tablet Take 1 tablet by mouth every 4 (four) hours as needed for moderate pain (4-6). 40 tablet 0   pantoprazole (PROTONIX) 40 MG tablet Take 1 tablet (40 mg total) by mouth 2  (two) times daily. 240 tablet 0   polyethylene glycol powder (GLYCOLAX/MIRALAX) 17 GM/SCOOP powder Take 17 g by mouth daily as needed for mild constipation.     prednisoLONE acetate (PRED FORTE) 1 % ophthalmic suspension Place 1 drop into the right eye 2 (two) times daily. 15 mL 4   REFRESH TEARS 0.5 % SOLN Place 1 drop into the right eye every 2 (two) hours. (Patient not taking: Reported on 10/11/2022)     Current Facility-Administered Medications on File Prior to Visit  Medication Dose Route Frequency Provider Last Rate Last Admin   sodium chloride flush (NS) 0.9 % injection 10 mL  10 mL Intravenous PRN Erenest Blank, NP   10 mL at 12/18/20 1009    Allergies  Allergen Reactions   Voriconazole Shortness Of Breath and Swelling    Patient reported that tablets sent her to the ER   Bactrim [Sulfamethoxazole-Trimethoprim] Swelling and Other (See Comments)  Site of swelling not recalled   Clarithromycin Swelling, Rash and Other (See Comments)    Daughter is unsure if Clarithromycin or Amoxicillin caused rash and swelling.  Patient states no allergy to amoxicillan - has taken this recently with no issues. (12/02/21)    Social History   Socioeconomic History   Marital status: Widowed    Spouse name: Not on file   Number of children: 5   Years of education: 14   Highest education level: Associate degree: occupational, Scientist, product/process development, or vocational program  Occupational History   Not on file  Tobacco Use   Smoking status: Never   Smokeless tobacco: Never  Vaping Use   Vaping status: Never Used  Substance and Sexual Activity   Alcohol use: Not Currently   Drug use: Never   Sexual activity: Not Currently  Other Topics Concern   Not on file  Social History Narrative   Originally from [place].  Speaks [place] and [place]   Social Determinants of Health   Financial Resource Strain: Not on file  Food Insecurity: No Food Insecurity (08/21/2022)   Hunger Vital Sign    Worried About  Running Out of Food in the Last Year: Never true    Ran Out of Food in the Last Year: Never true  Transportation Needs: No Transportation Needs (08/21/2022)   PRAPARE - Administrator, Civil Service (Medical): No    Lack of Transportation (Non-Medical): No  Physical Activity: Not on file  Stress: Not on file  Social Connections: Not on file  Intimate Partner Violence: Not At Risk (08/21/2022)   Humiliation, Afraid, Rape, and Kick questionnaire    Fear of Current or Ex-Partner: No    Emotionally Abused: No    Physically Abused: No    Sexually Abused: No    Family History  Problem Relation Age of Onset   Cancer Father        type unknown, was on the leg   Other Daughter        Acoustic neuroma   Breast cancer Neg Hx    Colon cancer Neg Hx    Esophageal cancer Neg Hx    Rectal cancer Neg Hx    Stomach cancer Neg Hx     Past Surgical History:  Procedure Laterality Date   COLON RESECTION  2005   In [place] - ?colon cancer   IR IMAGING GUIDED PORT INSERTION  09/18/2020   LAPAROSCOPIC GASTRIC RESECTION N/A 08/09/2020   Procedure: LAPAROSCOPIC EXPLORATION OF ABDOMEN, PERFORATED ULCER REPAIR, LIVER BIOPSY;  Surgeon: Karie Soda, MD;  Location: [facility];  Service: General;  Laterality: N/A;    ROS: Review of Systems Negative except as stated above  PHYSICAL EXAM: BP 138/78 (BP Location: Left Arm, Patient Position: Sitting, Cuff Size: Normal)   Pulse 95   Temp 98.3 F (36.8 C) (Oral)   Ht 5\' 4"  (1.626 m)   Wt 154 lb (69.9 kg)   SpO2 97%   BMI 26.43 kg/m   Physical Exam  General appearance - alert, well appearing, and in no distress Mental status - normal mood, behavior, speech, dress, motor activity, and thought processes Chest - clear to auscultation, no wheezes, rales or rhonchi, symmetric air entry Heart - normal rate, regular rhythm, normal S1, S2, no murmurs, rubs, clicks or gallops Extremities - peripheral pulses normal, no pedal edema, no clubbing or  cyanosis Skin -patient has a few raised spots hyperpigmented on both lower extremities.  No weeping lesions seen.  Latest Ref Rng & Units 10/11/2022    8:00 AM 09/17/2022   11:20 AM 09/10/2022    8:10 AM  CMP  Glucose 70 - 99 mg/dL 161  86  096   BUN 8 - 23 mg/dL 11  12  15    Creatinine 0.44 - 1.00 mg/dL 0.45  4.09  8.11   Sodium 135 - 145 mmol/L 136  139  141   Potassium 3.5 - 5.1 mmol/L 3.9  3.7  4.0   Chloride 98 - 111 mmol/L 104  109  105   CO2 22 - 32 mmol/L 24  25  27    Calcium 8.9 - 10.3 mg/dL 9.8  9.5  91.4   Total Protein 6.5 - 8.1 g/dL 6.8  7.1  7.1   Total Bilirubin 0.3 - 1.2 mg/dL 0.6  0.5  0.6   Alkaline Phos 38 - 126 U/L 107  115  110   AST 15 - 41 U/L 19  30  31    ALT 0 - 44 U/L 17  38  31    Lipid Panel  No results found for: "CHOL", "TRIG", "HDL", "CHOLHDL", "VLDL", "LDLCALC", "LDLDIRECT"  CBC    Component Value Date/Time   WBC 3.9 (L) 10/11/2022 0800   WBC 5.9 08/22/2022 0345   RBC 4.17 10/11/2022 0800   HGB 12.5 10/11/2022 0800   HCT 37.6 10/11/2022 0800   PLT 223 10/11/2022 0800   MCV 90.2 10/11/2022 0800   MCH 30.0 10/11/2022 0800   MCHC 33.2 10/11/2022 0800   RDW 15.8 (H) 10/11/2022 0800   LYMPHSABS 1.7 10/11/2022 0800   MONOABS 0.3 10/11/2022 0800   EOSABS 0.1 10/11/2022 0800   BASOSABS 0.0 10/11/2022 0800    ASSESSMENT AND PLAN:  1. Type 2 diabetes mellitus with peripheral neuropathy (HCC) At goal.  Continue Lantus 12 units daily, Humalog sliding scale. - POCT glycosylated hemoglobin (Hb A1C) - POCT glucose (manual entry)  2. Hypertension associated with diabetes (HCC) A little above goal but likely due to pain.  She will continue Cozaar 100 mg daily and nifedipine 30 mg twice a day.  3. Sympathetic ophthalmitis, right -Daughter was considering having her get a second opinion about needing to have the eye removed.  However she states that her mother has been in a lot of pain from the eye and has excepted the fact after weeks of  treatment including antibiotics that have not made a difference, probably best to proceed with having the eye extracted.  Currently getting oxycodone through ophthalmologist Dr. Sherryll Burger Will have our case worker meet with patient and daughter today to answer questions about whether she would qualify for emergency Medicaid again to pay to have recommended surgery of having right eye removed.  4. Encounter for immunization - Flu Vaccine Trivalent High Dose (Fluad)  5. Lichenoid dermatitis Successfully weaned off prednisone 2 months ago.  Advised schedule follow-up with dermatology  6. Encounter for screening mammogram for malignant neoplasm of breast -She is aged out of recommendation for mammogram.  However, having discussed this today with her and her daughter, I think it is reasonable for her to have 1 more mammogram done since she has had only 1 done in her lifetime - MM Digital Screening; Future   Patient was given the opportunity to ask questions.  Patient verbalized understanding of the plan and was able to repeat key elements of the plan.   This documentation was completed using Paediatric nurse.  Any transcriptional errors are unintentional.  Orders Placed This Encounter  Procedures   MM Digital Screening   Flu Vaccine Trivalent High Dose (Fluad)   POCT glycosylated hemoglobin (Hb A1C)   POCT glucose (manual entry)     Requested Prescriptions   Signed Prescriptions Disp Refills   NIFEdipine (PROCARDIA-XL/NIFEDICAL-XL) 30 MG 24 hr tablet 180 tablet 1    Sig: Take 1 tablet (30 mg total) by mouth in the morning and at bedtime.   losartan (COZAAR) 100 MG tablet 90 tablet 1    Sig: Take 1 tablet (100 mg total) by mouth at bedtime.    Return in about 3 months (around 01/27/2023).  Jonah Blue, MD, FACP

## 2022-10-29 ENCOUNTER — Other Ambulatory Visit: Payer: Self-pay

## 2022-10-29 MED ORDER — LOSARTAN POTASSIUM 100 MG PO TABS
100.0000 mg | ORAL_TABLET | Freq: Every day | ORAL | 1 refills | Status: DC
  Filled 2022-10-29 – 2023-02-07 (×2): qty 90, 90d supply, fill #0
  Filled 2023-05-20: qty 90, 90d supply, fill #1

## 2022-10-29 MED ORDER — NIFEDIPINE ER OSMOTIC RELEASE 30 MG PO TB24
30.0000 mg | ORAL_TABLET | Freq: Two times a day (BID) | ORAL | 1 refills | Status: DC
Start: 2022-10-29 — End: 2023-06-02
  Filled 2022-10-29: qty 180, 90d supply, fill #0
  Filled 2022-11-23: qty 60, 30d supply, fill #0
  Filled 2022-12-23 (×2): qty 60, 30d supply, fill #1
  Filled 2023-01-11 – 2023-01-26 (×2): qty 60, 30d supply, fill #2
  Filled 2023-02-28: qty 60, 30d supply, fill #3
  Filled 2023-03-29: qty 60, 30d supply, fill #4
  Filled 2023-05-02: qty 60, 30d supply, fill #5

## 2022-11-01 ENCOUNTER — Other Ambulatory Visit: Payer: Self-pay

## 2022-11-02 ENCOUNTER — Ambulatory Visit (HOSPITAL_BASED_OUTPATIENT_CLINIC_OR_DEPARTMENT_OTHER)
Admission: RE | Admit: 2022-11-02 | Discharge: 2022-11-02 | Disposition: A | Discharge status: Home / Self Care | Payer: Self-pay | Source: Ambulatory Visit | Attending: Ophthalmology | Admitting: Ophthalmology

## 2022-11-02 ENCOUNTER — Other Ambulatory Visit: Payer: Self-pay

## 2022-11-02 ENCOUNTER — Encounter: Payer: Self-pay | Admitting: Hematology & Oncology

## 2022-11-02 ENCOUNTER — Other Ambulatory Visit (HOSPITAL_COMMUNITY): Payer: Self-pay | Admitting: Ophthalmology

## 2022-11-02 DIAGNOSIS — H5440 Blindness, one eye, unspecified eye: Secondary | ICD-10-CM | POA: Insufficient documentation

## 2022-11-02 DIAGNOSIS — H5711 Ocular pain, right eye: Secondary | ICD-10-CM

## 2022-11-02 MED ORDER — OXYCODONE-ACETAMINOPHEN 5-325 MG PO TABS
1.0000 | ORAL_TABLET | ORAL | 0 refills | Status: DC | PRN
  Filled 2022-11-02: qty 40, 7d supply, fill #0

## 2022-11-02 MED ORDER — IOHEXOL 300 MG/ML  SOLN
75.0000 mL | Freq: Once | INTRAMUSCULAR | Status: AC | PRN
  Administered 2022-11-02: 75 mL via INTRAVENOUS

## 2022-11-03 ENCOUNTER — Other Ambulatory Visit: Payer: Self-pay

## 2022-11-08 ENCOUNTER — Encounter: Payer: Self-pay | Admitting: Hematology & Oncology

## 2022-11-08 ENCOUNTER — Inpatient Hospital Stay: Payer: Self-pay | Attending: Hematology & Oncology

## 2022-11-08 ENCOUNTER — Other Ambulatory Visit: Payer: Self-pay

## 2022-11-08 ENCOUNTER — Inpatient Hospital Stay: Payer: Self-pay

## 2022-11-08 ENCOUNTER — Inpatient Hospital Stay (HOSPITAL_BASED_OUTPATIENT_CLINIC_OR_DEPARTMENT_OTHER): Payer: Self-pay | Admitting: Hematology & Oncology

## 2022-11-08 VITALS — BP 149/79 | HR 91 | Temp 98.4°F | Resp 18 | Ht 64.0 in | Wt 152.0 lb

## 2022-11-08 VITALS — BP 151/74 | HR 82

## 2022-11-08 DIAGNOSIS — Z5112 Encounter for antineoplastic immunotherapy: Secondary | ICD-10-CM | POA: Insufficient documentation

## 2022-11-08 DIAGNOSIS — C220 Liver cell carcinoma: Secondary | ICD-10-CM

## 2022-11-08 DIAGNOSIS — B1920 Unspecified viral hepatitis C without hepatic coma: Secondary | ICD-10-CM | POA: Insufficient documentation

## 2022-11-08 LAB — CBC WITH DIFFERENTIAL (CANCER CENTER ONLY)
Abs Immature Granulocytes: 0 10*3/uL (ref 0.00–0.07)
Basophils Absolute: 0 10*3/uL (ref 0.0–0.1)
Basophils Relative: 1 %
Eosinophils Absolute: 0.1 10*3/uL (ref 0.0–0.5)
Eosinophils Relative: 1 %
HCT: 37.3 % (ref 36.0–46.0)
Hemoglobin: 12.4 g/dL (ref 12.0–15.0)
Immature Granulocytes: 0 %
Lymphocytes Relative: 32 %
Lymphs Abs: 1.2 10*3/uL (ref 0.7–4.0)
MCH: 29.7 pg (ref 26.0–34.0)
MCHC: 33.2 g/dL (ref 30.0–36.0)
MCV: 89.4 fL (ref 80.0–100.0)
Monocytes Absolute: 0.4 10*3/uL (ref 0.1–1.0)
Monocytes Relative: 9 %
Neutro Abs: 2.2 10*3/uL (ref 1.7–7.7)
Neutrophils Relative %: 57 %
Platelet Count: 200 10*3/uL (ref 150–400)
RBC: 4.17 MIL/uL (ref 3.87–5.11)
RDW: 14.9 % (ref 11.5–15.5)
WBC Count: 3.9 10*3/uL — ABNORMAL LOW (ref 4.0–10.5)
nRBC: 0 % (ref 0.0–0.2)

## 2022-11-08 LAB — CMP (CANCER CENTER ONLY)
ALT: 20 U/L (ref 0–44)
AST: 20 U/L (ref 15–41)
Albumin: 3.9 g/dL (ref 3.5–5.0)
Alkaline Phosphatase: 93 U/L (ref 38–126)
Anion gap: 7 (ref 5–15)
BUN: 12 mg/dL (ref 8–23)
CO2: 25 mmol/L (ref 22–32)
Calcium: 9.9 mg/dL (ref 8.9–10.3)
Chloride: 106 mmol/L (ref 98–111)
Creatinine: 0.63 mg/dL (ref 0.44–1.00)
GFR, Estimated: >60 mL/min (ref >60)
Glucose, Bld: 146 mg/dL — ABNORMAL HIGH (ref 70–99)
Potassium: 3.8 mmol/L (ref 3.5–5.1)
Sodium: 138 mmol/L (ref 135–145)
Total Bilirubin: 0.6 mg/dL (ref 0.3–1.2)
Total Protein: 6.9 g/dL (ref 6.5–8.1)

## 2022-11-08 LAB — LACTATE DEHYDROGENASE: LDH: 154 U/L (ref 98–192)

## 2022-11-08 LAB — TSH: TSH: 11.79 u[IU]/mL — ABNORMAL HIGH (ref 0.350–4.500)

## 2022-11-08 MED ORDER — SODIUM CHLORIDE 0.9 % IV SOLN: Freq: Once | INTRAVENOUS | Status: AC

## 2022-11-08 MED ORDER — SODIUM CHLORIDE 0.9 % IV SOLN
480.0000 mg | Freq: Once | INTRAVENOUS | Status: AC
  Administered 2022-11-08: 480 mg via INTRAVENOUS
  Filled 2022-11-08: qty 48

## 2022-11-08 MED ORDER — LEVOTHYROXINE SODIUM 75 MCG PO TABS
75.0000 ug | ORAL_TABLET | Freq: Every day | ORAL | 3 refills | Status: DC
Start: 2022-11-08 — End: 2023-03-17
  Filled 2022-11-08: qty 30, 30d supply, fill #0
  Filled 2022-12-13: qty 30, 30d supply, fill #1
  Filled 2023-01-11 (×2): qty 30, 30d supply, fill #2
  Filled 2023-02-07: qty 30, 30d supply, fill #3

## 2022-11-08 MED ORDER — SODIUM CHLORIDE 0.9% FLUSH
10.0000 mL | INTRAVENOUS | Status: DC | PRN
  Administered 2022-11-08: 10 mL

## 2022-11-08 MED ORDER — HEPARIN SOD (PORK) LOCK FLUSH 100 UNIT/ML IV SOLN
500.0000 [IU] | Freq: Once | INTRAVENOUS | Status: AC | PRN
  Administered 2022-11-08: 500 [IU]

## 2022-11-08 NOTE — Patient Instructions (Signed)
[facility]  Discharge Instructions: Thank you for choosing [facility] to provide your oncology and hematology care.   If you have a lab appointment with the Cancer Center, please go directly to the Cancer Center and check in at the registration area.  Wear comfortable clothing and clothing appropriate for easy access to any Portacath or PICC line.   We strive to give you quality time with your provider. You may need to reschedule your appointment if you arrive late (15 or more minutes).  Arriving late affects you and other patients whose appointments are after yours.  Also, if you miss three or more appointments without notifying the office, you may be dismissed from the clinic at the provider's discretion.      For prescription refill requests, have your pharmacy contact our office and allow 72 hours for refills to be completed.    Today you received the following chemotherapy and/or immunotherapy agents Opdivo.      To help prevent nausea and vomiting after your treatment, we encourage you to take your nausea medication as directed.  BELOW ARE SYMPTOMS THAT SHOULD BE REPORTED IMMEDIATELY: *FEVER GREATER THAN 100.4 F (38 C) OR HIGHER *CHILLS OR SWEATING *NAUSEA AND VOMITING THAT IS NOT CONTROLLED WITH YOUR NAUSEA MEDICATION *UNUSUAL SHORTNESS OF BREATH *UNUSUAL BRUISING OR BLEEDING *URINARY PROBLEMS (pain or burning when urinating, or frequent urination) *BOWEL PROBLEMS (unusual diarrhea, constipation, pain near the anus) TENDERNESS IN MOUTH AND THROAT WITH OR WITHOUT PRESENCE OF ULCERS (sore throat, sores in mouth, or a toothache) UNUSUAL RASH, SWELLING OR PAIN  UNUSUAL VAGINAL DISCHARGE OR ITCHING   Items with * indicate a potential emergency and should be followed up as soon as possible or go to the Emergency Department if any problems should occur.  Please show the CHEMOTHERAPY ALERT CARD or IMMUNOTHERAPY ALERT CARD at check-in  to the Emergency Department and triage nurse. Should you have questions after your visit or need to cancel or reschedule your appointment, please contact [facility]  [phone] and follow the prompts.  Office hours are 8:00 a.m. to 4:30 p.m. Monday - Friday. Please note that voicemails left after 4:00 p.m. may not be returned until the following business day.  We are closed weekends and major holidays. You have access to a nurse at all times for urgent questions. Please call the main number to the clinic [phone] and follow the prompts.  For any non-urgent questions, you may also contact your provider using MyChart. We now offer e-Visits for anyone 38 and older to request care online for non-urgent symptoms. For details visit [email or web address].   Also download the MyChart app! Go to the app store, search "MyChart", open the app, select [facility], and log in with your MyChart username and password.

## 2022-11-08 NOTE — Progress Notes (Signed)
All Hematology and Oncology Follow Up Visit  [name] [name] [id number] 1945/08/28 77 y.o. 11/08/2022   Principle Diagnosis:  Hepatocellular carcinoma-multifocal Hepatitis C   Current Therapy:        Status post cycle 1 of nivolumab/ipilimumab -- d/c on 10/13/2020 due to hepatic toxicity Nivolumab 480 mg IV every 4 weeks - s/p cycle #21 -- started 11/21/2020 --changed to 480 mg on 08/2021 Nexavar 200 mg po BID -- to be taken while in [place] -- start on 02/26/2022 -- d/c on 06/16/2022   Interim History:  [name] [name] is here today for follow-up.  Unfortunately, the real problem is still with this fungal infection of the right eye.  Despite everybody's efforts, she is going need to have the eye enucleated.  She is going to have this done on 11/19/2022.  I just hate that she needs to have this done.  I told her daughter that I just cannot see out of the way around this.  She is having so much pain.  She is on pain medication every 3-4 hours.  She is not eating all that much because of the pain.  I think that once the eye is removed, the pain will improve immensely..  She did have a CT of the orbits last week.  There is nothing that was unusual that I can see on the report.  She is watching her diabetes.  This, thankfully has been doing pretty well.  Her liver cancer really is not a problem.  Her last AFP was down to 6.2.  She did have an MRI done on 10/20/2022.  The MRI does show that she has stable disease.  She has had no change in bowel or bladder habits.  She has had no fever.  She has had no nausea or vomiting.  Currently, I would have said that her performance status is probably ECOG 1.    Medications:  Allergies as of 11/08/2022       Reactions   Voriconazole Shortness Of Breath, Swelling   Patient reported that tablets sent her to the ER   Bactrim [sulfamethoxazole-trimethoprim] Swelling, Other (See Comments)   Site of swelling not recalled    Clarithromycin Swelling, Rash, Other (See Comments)   Daughter is unsure if Clarithromycin or Amoxicillin caused rash and swelling. Patient states no allergy to amoxicillan - has taken this recently with no issues. (12/02/21)        Medication List        Accurate as of November 08, 2022  8:43 AM. If you have any questions, ask your nurse or doctor.          bacitracin-polymyxin b ophthalmic ointment Commonly known as: POLYSPORIN Apply 0.5 inch ribbon to affected eye(s) 4 (four) times a day.   bacitracin-polymyxin b ophthalmic ointment Commonly known as: POLYSPORIN Apply 1 Application to eye.   Basaglar KwikPen 100 UNIT/ML Inject 12 Units into the skin in the morning.   Basaglar KwikPen 100 UNIT/ML Inject 12 Units into the skin daily.   brimonidine 0.15 % ophthalmic solution Commonly known as: ALPHAGAN Place into the left eye 2 (two) times daily.   erythromycin ophthalmic ointment Place  0.5 inch ribbon to the right eye 4 (four) times daily.   gabapentin 300 MG capsule Commonly known as: NEURONTIN Take 1 capsule (300 mg total) by mouth 3 (three) times daily.   HumaLOG KwikPen 200 UNIT/ML KwikPen Generic drug: insulin lispro Max daily dose 100 units. What changed:  how much  to take how to take this when to take this additional instructions   lidocaine-prilocaine cream Commonly known as: EMLA Apply 1 Application topically as needed (for port access).   losartan 100 MG tablet Commonly known as: COZAAR Take 1 tablet (100 mg total) by mouth at bedtime.   NIFEdipine 30 MG 24 hr tablet Commonly known as: PROCARDIA-XL/NIFEDICAL-XL Take 1 tablet (30 mg total) by mouth in the morning and at bedtime.   oxyCODONE-acetaminophen 5-325 MG tablet Commonly known as: PERCOCET/ROXICET Take 1 tablet by mouth every 4 (four) hours as needed for moderate pain (4-6).   oxyCODONE-acetaminophen 5-325 MG tablet Commonly known as: PERCOCET/ROXICET Take 1 tablet by mouth  every 4 (four) hours as needed for moderate pain (4-6).   pantoprazole 40 MG tablet Commonly known as: Protonix Take 1 tablet (40 mg total) by mouth 2 (two) times daily.   polyethylene glycol powder 17 GM/SCOOP powder Commonly known as: GLYCOLAX/MIRALAX Take 17 g by mouth daily as needed for mild constipation.   prednisoLONE acetate 1 % ophthalmic suspension Commonly known as: PRED FORTE Place 1 drop into the right eye 2 (two) times daily.   Refresh Tears 0.5 % Soln Generic drug: carboxymethylcellulose Place 1 drop into the right eye every 2 (two) hours.   TechLite Plus Pen Needles 32G X 4 MM Misc Generic drug: Insulin Pen Needle Use with insulins daily.   True Metrix Blood Glucose Test test strip Generic drug: glucose blood Use as instructed        Allergies:  Allergies  Allergen Reactions   Voriconazole Shortness Of Breath and Swelling    Patient reported that tablets sent her to the ER   Bactrim [Sulfamethoxazole-Trimethoprim] Swelling and Other (See Comments)    Site of swelling not recalled   Clarithromycin Swelling, Rash and Other (See Comments)    Daughter is unsure if Clarithromycin or Amoxicillin caused rash and swelling.  Patient states no allergy to amoxicillan - has taken this recently with no issues. (12/02/21)    Past Medical History, Surgical history, Social history, and Family History were reviewed and updated.  Review of Systems: Review of Systems  Constitutional: Negative.   HENT: Negative.    Eyes: Negative.   Respiratory: Negative.    Cardiovascular: Negative.   Gastrointestinal: Negative.   Genitourinary: Negative.   Musculoskeletal: Negative.   Skin: Negative.   Neurological:  Positive for tingling.  Endo/Heme/Allergies: Negative.   Psychiatric/Behavioral: Negative.       Physical Exam: Vital signs with temperature of 98.4.  Pulse 91.  Blood pressure 149/79.  Weight is 152.    Wt Readings from Last 3 Encounters:  11/08/22 152 lb  (68.9 kg)  10/28/22 154 lb (69.9 kg)  10/11/22 151 lb (68.5 kg)    Physical Exam Vitals reviewed.  HENT:     Head: Normocephalic and atraumatic.  Eyes:     Pupils: Pupils are equal, round, and reactive to light.     Comments: Her right eye is closed    Cardiovascular:     Rate and Rhythm: Normal rate and regular rhythm.     Heart sounds: Normal heart sounds.  Pulmonary:     Effort: Pulmonary effort is normal.     Breath sounds: Normal breath sounds.  Abdominal:     General: Bowel sounds are normal.     Palpations: Abdomen is soft.     Comments: Abdominal exam shows a well-healed laparotomy scar.  She has no fluid wave.  There is no guarding or rebound  tenderness to palpation.  There is no palpable liver or spleen tip.  Musculoskeletal:        General: No tenderness or deformity. Normal range of motion.     Cervical back: Normal range of motion.  Lymphadenopathy:     Cervical: No cervical adenopathy.  Skin:    General: Skin is warm and dry.     Findings: No erythema or rash.     Comments: Skin exam does show a extensive but somewhat healing papular rash on her lower legs.  This is below the knees.  She has some dressing on the foot on the right foot.  She has no open wounds.  There is no blisters.  Neurological:     Mental Status: She is alert and oriented to person, place, and time.  Psychiatric:        Behavior: Behavior normal.        Thought Content: Thought content normal.        Judgment: Judgment normal.      Lab Results  Component Value Date   WBC 3.9 (L) 11/08/2022   HGB 12.4 11/08/2022   HCT 37.3 11/08/2022   MCV 89.4 11/08/2022   PLT 200 11/08/2022   Lab Results  Component Value Date   FERRITIN 603 (H) 10/02/2020   IRON 31 (L) 10/02/2020   TIBC 241 10/02/2020   UIBC 211 10/02/2020   IRONPCTSAT 13 (L) 10/02/2020   Lab Results  Component Value Date   RBC 4.17 11/08/2022   No results found for: "KPAFRELGTCHN", "LAMBDASER", "KAPLAMBRATIO" No results  found for: "IGGSERUM", "IGA", "IGMSERUM" No results found for: "TOTALPROTELP", "ALBUMINELP", "A1GS", "A2GS", "BETS", "BETA2SER", "GAMS", "MSPIKE", "SPEI"   Chemistry      Component Value Date/Time   NA 136 10/11/2022 0800   NA 140 01/22/2022 0849   K 3.9 10/11/2022 0800   CL 104 10/11/2022 0800   CO2 24 10/11/2022 0800   BUN 11 10/11/2022 0800   BUN 11 01/22/2022 0849   CREATININE 0.60 10/11/2022 0800   CREATININE 0.58 (L) 08/20/2021 1541      Component Value Date/Time   CALCIUM 9.8 10/11/2022 0800   ALKPHOS 107 10/11/2022 0800   AST 19 10/11/2022 0800   ALT 17 10/11/2022 0800   BILITOT 0.6 10/11/2022 0800       Impression and Plan: [name] [name] is a very pleasant 77 yo female from [place] with hepatocellular carcinoma and Hepatitis C.  She actually has had treatment for the Hepatitis C.  I am sure that this is part of the reason why she has done so well with response.  Again, the hepatocellular carcinoma really is doing quite nicely.  We have her on the nivolumab.  We do not have to make any changes.  Again, the MRI is quite reassuring.  We will plan to get her back in 5 weeks.  I want to give her an extra week off to so that she can have little bit of better recovery from her right eye enucleation.   [name], MD 9/23/20248:43 AM

## 2022-11-09 LAB — AFP TUMOR MARKER: AFP, Serum, Tumor Marker: 6.3 ng/mL (ref 0.0–9.2)

## 2022-11-12 ENCOUNTER — Other Ambulatory Visit: Payer: Self-pay

## 2022-11-12 ENCOUNTER — Encounter: Payer: Self-pay | Admitting: Hematology & Oncology

## 2022-11-19 ENCOUNTER — Other Ambulatory Visit (HOSPITAL_COMMUNITY): Payer: Self-pay

## 2022-11-19 ENCOUNTER — Encounter: Payer: Self-pay | Admitting: Hematology & Oncology

## 2022-11-19 ENCOUNTER — Other Ambulatory Visit: Payer: Self-pay

## 2022-11-19 MED ORDER — DOCUSATE SODIUM 100 MG PO CAPS
100.0000 mg | ORAL_CAPSULE | Freq: Every day | ORAL | 0 refills | Status: DC
  Filled 2022-11-19: qty 100, 100d supply, fill #0

## 2022-11-19 MED ORDER — METHYLPREDNISOLONE 4 MG PO TBPK
ORAL_TABLET | ORAL | 0 refills | Status: DC
  Filled 2022-11-19: qty 21, 6d supply, fill #0

## 2022-11-19 MED ORDER — ONDANSETRON 4 MG PO TBDP
4.0000 mg | ORAL_TABLET | Freq: Three times a day (TID) | ORAL | 0 refills | Status: DC | PRN
  Filled 2022-11-19: qty 12, 4d supply, fill #0

## 2022-11-19 MED ORDER — CEPHALEXIN 500 MG PO CAPS
500.0000 mg | ORAL_CAPSULE | Freq: Two times a day (BID) | ORAL | 0 refills | Status: DC
  Filled 2022-11-19: qty 14, 7d supply, fill #0

## 2022-11-23 ENCOUNTER — Encounter: Payer: Self-pay | Admitting: Hematology & Oncology

## 2022-11-23 ENCOUNTER — Other Ambulatory Visit: Payer: Self-pay

## 2022-11-24 ENCOUNTER — Other Ambulatory Visit: Payer: Self-pay

## 2022-11-25 ENCOUNTER — Encounter: Payer: Self-pay | Admitting: Hematology & Oncology

## 2022-11-25 ENCOUNTER — Other Ambulatory Visit: Payer: Self-pay

## 2022-11-25 ENCOUNTER — Other Ambulatory Visit (HOSPITAL_COMMUNITY): Payer: Self-pay

## 2022-11-25 MED ORDER — NEOMYCIN-POLYMYXIN-DEXAMETH 3.5-10000-0.1 OP SUSP
OPHTHALMIC | 1 refills | Status: AC
  Filled 2022-11-25 – 2022-12-14 (×3): qty 5, 28d supply, fill #0

## 2022-12-01 ENCOUNTER — Encounter: Payer: Self-pay | Admitting: Internal Medicine

## 2022-12-01 ENCOUNTER — Encounter: Payer: Self-pay | Admitting: Hematology & Oncology

## 2022-12-01 ENCOUNTER — Ambulatory Visit (INDEPENDENT_AMBULATORY_CARE_PROVIDER_SITE_OTHER): Payer: Self-pay | Admitting: Internal Medicine

## 2022-12-01 VITALS — BP 134/80 | HR 96 | Ht 64.0 in | Wt 156.0 lb

## 2022-12-01 DIAGNOSIS — E119 Type 2 diabetes mellitus without complications: Secondary | ICD-10-CM

## 2022-12-01 DIAGNOSIS — Z794 Long term (current) use of insulin: Secondary | ICD-10-CM

## 2022-12-01 NOTE — Patient Instructions (Signed)
Continue Basaglar  12 units ONCE daily  Take Humalog 12 units with Breakfast, 12 units with Lunch and 12 units with Supper Humalog correctional insulin: ADD extra units on insulin to your meal-time Humalog  dose if your blood sugars are higher than 155. Use the scale below to help guide you:   Blood sugar before meal Number of units to inject  Less than 155 0 unit  156 -  180 1 units  181 -  205 2 units  206 -  230 3 units  231 -  255 4 units  256 -  280 5 units  281 -  305 6 units  306 -  330 7 units  331 -  355 8 units  356 - 380 9 units     HOW TO TREAT LOW BLOOD SUGARS (Blood sugar LESS THAN 70 MG/DL) Please follow the RULE OF 15 for the treatment of hypoglycemia treatment (when your (blood sugars are less than 70 mg/dL)   STEP 1: Take 15 grams of carbohydrates when your blood sugar is low, which includes:  3-4 GLUCOSE TABS  OR 3-4 OZ OF JUICE OR REGULAR SODA OR ONE TUBE OF GLUCOSE GEL    STEP 2: RECHECK blood sugar in 15 MINUTES STEP 3: If your blood sugar is still low at the 15 minute recheck --> then, go back to STEP 1 and treat AGAIN with another 15 grams of carbohydrates.

## 2022-12-01 NOTE — Progress Notes (Signed)
Name: [name] [name]  MRN/ DOB: [id number], [birth year]   Age/ Sex: 77 y.o., female    PCP: Marcine Matar, MD   Reason for Endocrinology Evaluation: Type 2 Diabetes Mellitus     Date of Initial Endocrinology Visit: 01/18/2022    PATIENT IDENTIFIER: [name] is a 77 y.o. female with a past medical history of Hx of perforated gastric ulcer, chronic hepatitis C, HTN, hepatocellular carcinoma, T2DM. The patient presented for initial endocrinology clinic visit on 01/18/2022 for consultative assistance with her diabetes management.    HPI: [name] is here with her daughter    Diagnosed with DM 2005 Prior Medications tried/Intolerance: was on oral agents years ago , bit does not recall  Hemoglobin A1c has ranged from 7.3% in 06/2021, peaking at 7.5% in 2023.  Of note, the pt follows with oncology ( Dr. Myna Hidalgo) for hepatocellular carcinoma, currently on Nivolumab infusions   She is on Prednisone  through dermatology for lichenoid dermatitis   ON her initial visit to our clinic her A1c 6.7% but she was on Humalog Mix Plus regular humalog and was guesstimating her  doses. We switched  to basal/prandial insulin   SUBJECTIVE:   During the last visit (06/23/2022): A1c 6.7%     Today (12/01/22): [name] [name] is here for a follow up on diabetes management. She is accompanied by her daughter. She checks her blood sugars 3-4 times daily. The patient has  had hypoglycemic episodes since the last clinic visit, which typically occur  in the evening    Patient follows with oncology for hepatocellular carcinoma, hepatitis C.  Which is doing pretty good.  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  Denies nausea or vomiting  Has chronic constipation usually but this has  been improving     HOME DIABETES REGIMEN: Basaglar 12 units daily Humalog 12 units TIQAC  Correction factor: Humalog (BG -130/25)    Statin: No ACE-I/ARB: Yes    METER DOWNLOAD SUMMARY:  90 day average 139 mg/dl  68- 528 mg/dL     DIABETIC COMPLICATIONS: Microvascular complications:   Denies: CKD Last eye exam: Completed 08/2022  Macrovascular complications:   Denies: CAD, PVD, CVA   PAST HISTORY: Past Medical History:  Past Medical History:  Diagnosis Date   Colon cancer (HCC)    Diabetes mellitus without complication (HCC)    Glaucoma    Goals of care, counseling/discussion 09/01/2020   Hepatitis C    Hepatocellular carcinoma (HCC)    Hypertension    Past Surgical History:  Past Surgical History:  Procedure Laterality Date   COLON RESECTION  2005   In [place] - ?colon cancer   IR IMAGING GUIDED PORT INSERTION  09/18/2020   LAPAROSCOPIC GASTRIC RESECTION N/A 08/09/2020   Procedure: LAPAROSCOPIC EXPLORATION OF ABDOMEN, PERFORATED ULCER REPAIR, LIVER BIOPSY;  Surgeon: Karie Soda, MD;  Location: [facility];  Service: General;  Laterality: N/A;    Social History:  reports that she has never smoked. She has never used smokeless tobacco. She reports that she does not currently use alcohol. She reports that she does not use drugs. Family History:  Family History  Problem Relation Age of Onset   Cancer Father        type unknown, was on the leg  Other Daughter        Acoustic neuroma   Breast cancer Neg Hx    Colon cancer Neg Hx    Esophageal cancer Neg Hx    Rectal cancer Neg Hx    Stomach cancer Neg Hx      HOME MEDICATIONS: Allergies as of 12/01/2022       Reactions   Voriconazole Shortness Of Breath, Swelling   Patient reported that tablets sent her to the ER   Bactrim [sulfamethoxazole-trimethoprim] Swelling, Other (See Comments)   Site of swelling not recalled   Clarithromycin Swelling, Rash, Other (See Comments)   Daughter is unsure if  Clarithromycin or Amoxicillin caused rash and swelling. Patient states no allergy to amoxicillan - has taken this recently with no issues. (12/02/21)        Medication List        Accurate as of December 01, 2022  7:26 AM. If you have any questions, ask your nurse or doctor.          bacitracin-polymyxin b ophthalmic ointment Commonly known as: POLYSPORIN Apply 0.5 inch ribbon to affected eye(s) 4 (four) times a day.   bacitracin-polymyxin b ophthalmic ointment Commonly known as: POLYSPORIN Apply 1 Application to eye.   Basaglar KwikPen 100 UNIT/ML Inject 12 Units into the skin in the morning.   Basaglar KwikPen 100 UNIT/ML Inject 12 Units into the skin daily.   brimonidine 0.15 % ophthalmic solution Commonly known as: ALPHAGAN Place into the left eye 2 (two) times daily.   cephALEXin 500 MG capsule Commonly known as: KEFLEX Take 1 capsule (500 mg total) by mouth 2 (two) times daily.   docusate sodium 100 MG capsule Commonly known as: Colace Take 1 capsule (100 mg total) by mouth daily.   erythromycin ophthalmic ointment Place  0.5 inch ribbon to the right eye 4 (four) times daily.   gabapentin 300 MG capsule Commonly known as: NEURONTIN Take 1 capsule (300 mg total) by mouth 3 (three) times daily.   HumaLOG KwikPen 200 UNIT/ML KwikPen Generic drug: insulin lispro Max daily dose 100 units. What changed:  how much to take how to take this when to take this additional instructions   levothyroxine 75 MCG tablet Commonly known as: Synthroid Take 1 tablet (75 mcg total) by mouth daily before breakfast.   lidocaine-prilocaine cream Commonly known as: EMLA Apply 1 Application topically as needed (for port access).   losartan 100 MG tablet Commonly known as: COZAAR Take 1 tablet (100 mg total) by mouth at bedtime.   methylPREDNISolone 4 MG Tbpk tablet Commonly known as: Medrol Start Saturday morning, use as directed. Measure blood sugars    neomycin-polymyxin b-dexamethasone 3.5-10000-0.1 Susp Commonly known as: MAXITROL Place 1 drop into the right eye 4  times daily for 7 days, THEN 1 drop 3  times daily for 7 days, THEN 1 drop 2  times daily for 7 days, THEN 1 drop daily for 7 days. Start taking on: November 25, 2022   NIFEdipine 30 MG 24 hr tablet Commonly known as: PROCARDIA-XL/NIFEDICAL-XL Take 1 tablet (30 mg total) by mouth in the morning and at bedtime.   ondansetron 4 MG disintegrating tablet Commonly known as: ZOFRAN-ODT Take 1 tablet (4 mg total) by mouth every 8 (eight) hours as needed for post operative nausea   oxyCODONE-acetaminophen 5-325 MG tablet Commonly known as: PERCOCET/ROXICET Take 1 tablet by mouth every 4 (four) hours as needed for moderate pain (4-6).   oxyCODONE-acetaminophen 5-325 MG tablet Commonly known as:  PERCOCET/ROXICET Take 1 tablet by mouth every 4 (four) hours as needed for moderate pain (4-6).   pantoprazole 40 MG tablet Commonly known as: Protonix Take 1 tablet (40 mg total) by mouth 2 (two) times daily.   polyethylene glycol powder 17 GM/SCOOP powder Commonly known as: GLYCOLAX/MIRALAX Take 17 g by mouth daily as needed for mild constipation.   prednisoLONE acetate 1 % ophthalmic suspension Commonly known as: PRED FORTE Place 1 drop into the right eye 2 (two) times daily.   Refresh Tears 0.5 % Soln Generic drug: carboxymethylcellulose Place 1 drop into the right eye every 2 (two) hours.   TechLite Plus Pen Needles 32G X 4 MM Misc Generic drug: Insulin Pen Needle Use with insulins daily.   True Metrix Blood Glucose Test test strip Generic drug: glucose blood Use as instructed         ALLERGIES: Allergies  Allergen Reactions   Voriconazole Shortness Of Breath and Swelling    Patient reported that tablets sent her to the ER   Bactrim [Sulfamethoxazole-Trimethoprim] Swelling and Other (See Comments)    Site of swelling not recalled   Clarithromycin Swelling,  Rash and Other (See Comments)    Daughter is unsure if Clarithromycin or Amoxicillin caused rash and swelling.  Patient states no allergy to amoxicillan - has taken this recently with no issues. (12/02/21)     REVIEW OF SYSTEMS: A comprehensive ROS was conducted with the patient and is negative except as per HPI     OBJECTIVE:   VITAL SIGNS: BP 134/80 (BP Location: Right Arm, Patient Position: Sitting, Cuff Size: Small)   Pulse 96   Ht 5\' 4"  (1.626 m)   Wt 156 lb (70.8 kg)   SpO2 99%   BMI 26.78 kg/m     PHYSICAL EXAM:  General: Pt appears well and is in NAD  Neck: General: Supple without adenopathy or carotid bruits. Thyroid: Thyroid size normal.  No goiter or nodules appreciated.   Lungs: Clear with good BS bilat with no rales, rhonchi, or wheezes  Heart: RRR   Extremities:  Lower extremities - No pretibial edema  Neuro: MS is good with appropriate affect, pt is alert and Ox3   DM Foot Exam 06/23/2022  The skin of the feet is intact without sores or ulcerations. The pedal pulses are 2+ on right and 2+ on left. The sensation is intact to a screening 5.07, 10 gram monofilament bilaterally    DATA REVIEWED:  Lab Results  Component Value Date   HGBA1C 6.3 10/28/2022   HGBA1C 6.6 (A) 06/23/2022   HGBA1C 6.7 (A) 01/18/2022    Latest Reference Range & Units 11/08/22 08:20  Sodium 135 - 145 mmol/L 138  Potassium 3.5 - 5.1 mmol/L 3.8  Chloride 98 - 111 mmol/L 106  CO2 22 - 32 mmol/L 25  Glucose 70 - 99 mg/dL 416 (H)  BUN 8 - 23 mg/dL 12  Creatinine 6.06 - 3.01 mg/dL 6.01  Calcium 8.9 - 09.3 mg/dL 9.9  Anion gap 5 - 15  7  Alkaline Phosphatase 38 - 126 U/L 93  Albumin 3.5 - 5.0 g/dL 3.9  AST 15 - 41 U/L 20  ALT 0 - 44 U/L 20  Total Protein 6.5 - 8.1 g/dL 6.9  Total Bilirubin 0.3 - 1.2 mg/dL 0.6  GFR, Est Non African American >60 mL/min >60    Latest Reference Range & Units 11/08/22 08:20  TSH 0.350 - 4.500 uIU/mL 11.790 (H)  (H): Data is abnormally  high  ASSESSMENT / PLAN / RECOMMENDATIONS:   1) Type 2 Diabetes Mellitus, Optimally controlled, Without complications - Most recent A1c of 6.6 %. Goal A1c < 7.0 %.     -A1c at goal -Patient has been noted with rare hypoglycemia due to insulin-carbohydrate mismatch -We again emphasized the importance of taking Humalog with a meal rather than a snack -Patient has no health insurance and I am not aware of any patient assistance programs for CGM technology   MEDICATIONS: -Continue basaglar 12 units daily -Change Humalog 12 units TIDQAC -Continue Correction factor: Humalog (BG -130/25)  EDUCATION / INSTRUCTIONS: BG monitoring instructions: Patient is instructed to check her blood sugars 3 times a day, before each meal . Call [facility] if: BG persistently < 70  I reviewed the Rule of 15 for the treatment of hypoglycemia in detail with the patient. Literature supplied.   2) Diabetic complications:  Eye: Does not have known diabetic retinopathy.  Neuro/ Feet: Does not have known diabetic peripheral neuropathy. Renal: Patient does not have known baseline CKD. She is  on an ACEI/ARB at present.    Follow-up in 6 months   Signed electronically by: Lyndle Herrlich, MD  [facility] End[facility]  [facility] [address] [place], [place] [address] Phone: [phone] FAX: [phone]   CC: Marcine Matar, MD [address] [place] [address] Phone: [phone]  Fax: [phone]    Return to Endocrinology clinic as below: Future Appointments  Date Time Provider Department Center  12/01/2022  1:00 PM Ryland Tungate, Konrad Dolores, MD [facility] None  12/13/2022 10:15 AM [facility] LAB [facility] None  12/13/2022 10:30 AM [facility] INJ NURSE [facility] None  12/13/2022 10:45 AM Myna Hidalgo, Rose Phi, MD [facility] None  12/13/2022 11:00 AM [facility] INFUSION [facility] None  01/27/2023  3:10 PM Marcine Matar, MD [facility]  None

## 2022-12-03 ENCOUNTER — Other Ambulatory Visit: Payer: Self-pay

## 2022-12-06 ENCOUNTER — Ambulatory Visit: Payer: No Typology Code available for payment source | Admitting: Medical Oncology

## 2022-12-06 ENCOUNTER — Inpatient Hospital Stay: Payer: Self-pay

## 2022-12-06 ENCOUNTER — Other Ambulatory Visit: Payer: No Typology Code available for payment source

## 2022-12-06 ENCOUNTER — Ambulatory Visit: Payer: No Typology Code available for payment source

## 2022-12-13 ENCOUNTER — Inpatient Hospital Stay (HOSPITAL_BASED_OUTPATIENT_CLINIC_OR_DEPARTMENT_OTHER): Payer: Self-pay | Admitting: Hematology & Oncology

## 2022-12-13 ENCOUNTER — Inpatient Hospital Stay: Payer: Self-pay

## 2022-12-13 ENCOUNTER — Other Ambulatory Visit: Payer: Self-pay

## 2022-12-13 ENCOUNTER — Other Ambulatory Visit (HOSPITAL_COMMUNITY): Payer: Self-pay

## 2022-12-13 ENCOUNTER — Inpatient Hospital Stay: Payer: Self-pay | Attending: Hematology & Oncology

## 2022-12-13 ENCOUNTER — Encounter: Payer: Self-pay | Admitting: Hematology & Oncology

## 2022-12-13 VITALS — BP 134/71 | HR 82 | Temp 97.6°F | Resp 18 | Ht 64.0 in | Wt 155.0 lb

## 2022-12-13 VITALS — BP 136/76 | HR 84

## 2022-12-13 DIAGNOSIS — C220 Liver cell carcinoma: Secondary | ICD-10-CM | POA: Insufficient documentation

## 2022-12-13 DIAGNOSIS — Z79890 Hormone replacement therapy (postmenopausal): Secondary | ICD-10-CM | POA: Insufficient documentation

## 2022-12-13 DIAGNOSIS — Z5112 Encounter for antineoplastic immunotherapy: Secondary | ICD-10-CM | POA: Insufficient documentation

## 2022-12-13 DIAGNOSIS — E038 Other specified hypothyroidism: Secondary | ICD-10-CM

## 2022-12-13 LAB — LACTATE DEHYDROGENASE: LDH: 155 U/L (ref 98–192)

## 2022-12-13 LAB — CBC WITH DIFFERENTIAL (CANCER CENTER ONLY)
Abs Immature Granulocytes: 0.01 10*3/uL (ref 0.00–0.07)
Basophils Absolute: 0 10*3/uL (ref 0.0–0.1)
Basophils Relative: 0 %
Eosinophils Absolute: 0.1 10*3/uL (ref 0.0–0.5)
Eosinophils Relative: 1 %
HCT: 38.3 % (ref 36.0–46.0)
Hemoglobin: 12.4 g/dL (ref 12.0–15.0)
Immature Granulocytes: 0 %
Lymphocytes Relative: 32 %
Lymphs Abs: 1.6 10*3/uL (ref 0.7–4.0)
MCH: 28.4 pg (ref 26.0–34.0)
MCHC: 32.4 g/dL (ref 30.0–36.0)
MCV: 87.8 fL (ref 80.0–100.0)
Monocytes Absolute: 0.3 10*3/uL (ref 0.1–1.0)
Monocytes Relative: 6 %
Neutro Abs: 3 10*3/uL (ref 1.7–7.7)
Neutrophils Relative %: 61 %
Platelet Count: 190 10*3/uL (ref 150–400)
RBC: 4.36 MIL/uL (ref 3.87–5.11)
RDW: 14.6 % (ref 11.5–15.5)
WBC Count: 5 10*3/uL (ref 4.0–10.5)
nRBC: 0 % (ref 0.0–0.2)

## 2022-12-13 LAB — CMP (CANCER CENTER ONLY)
ALT: 13 U/L (ref 0–44)
AST: 13 U/L — ABNORMAL LOW (ref 15–41)
Albumin: 4.1 g/dL (ref 3.5–5.0)
Alkaline Phosphatase: 96 U/L (ref 38–126)
Anion gap: 7 (ref 5–15)
BUN: 16 mg/dL (ref 8–23)
CO2: 26 mmol/L (ref 22–32)
Calcium: 10.1 mg/dL (ref 8.9–10.3)
Chloride: 106 mmol/L (ref 98–111)
Creatinine: 0.62 mg/dL (ref 0.44–1.00)
GFR, Estimated: >60 mL/min (ref >60)
Glucose, Bld: 179 mg/dL — ABNORMAL HIGH (ref 70–99)
Potassium: 3.9 mmol/L (ref 3.5–5.1)
Sodium: 139 mmol/L (ref 135–145)
Total Bilirubin: 0.5 mg/dL (ref 0.3–1.2)
Total Protein: 6.4 g/dL — ABNORMAL LOW (ref 6.5–8.1)

## 2022-12-13 MED ORDER — ZOLEDRONIC ACID 4 MG/100ML IV SOLN
4.0000 mg | Freq: Once | INTRAVENOUS | Status: AC
  Administered 2022-12-13: 4 mg via INTRAVENOUS
  Filled 2022-12-13: qty 100

## 2022-12-13 MED ORDER — SODIUM CHLORIDE 0.9% FLUSH
10.0000 mL | INTRAVENOUS | Status: DC | PRN
Start: 2022-12-13 — End: 2022-12-13
  Administered 2022-12-13: 10 mL

## 2022-12-13 MED ORDER — SODIUM CHLORIDE 0.9 % IV SOLN
480.0000 mg | Freq: Once | INTRAVENOUS | Status: AC
  Administered 2022-12-13: 480 mg via INTRAVENOUS
  Filled 2022-12-13: qty 48

## 2022-12-13 MED ORDER — SODIUM CHLORIDE 0.9 % IV SOLN: Freq: Once | INTRAVENOUS | Status: AC

## 2022-12-13 MED ORDER — HEPARIN SOD (PORK) LOCK FLUSH 100 UNIT/ML IV SOLN
500.0000 [IU] | Freq: Once | INTRAVENOUS | Status: AC | PRN
  Administered 2022-12-13: 500 [IU]

## 2022-12-13 NOTE — Patient Instructions (Signed)

## 2022-12-13 NOTE — Progress Notes (Signed)
All Hematology and Oncology Follow Up Visit  [name] [name] [name] [name] [id number] Jul 15, 1945 77 y.o. 12/13/2022   Principle Diagnosis:  Hepatocellular carcinoma-multifocal Hepatitis C   Current Therapy:        Status post cycle 1 of nivolumab/ipilimumab -- d/c on 10/13/2020 due to hepatic toxicity Nivolumab 480 mg IV every 4 weeks - s/p cycle #21 -- started 11/21/2020 --changed to 480 mg on 08/2021 Nexavar 200 mg po BID -- to be taken while in [place] -- start on 02/26/2022 -- d/c on 06/16/2022   Interim History:  [name] [name] [name] is here today for follow-up.  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  This really helped her out quite a bit.  She is no longer in pain.  She is still being evaluated by an ocularist for the ocular prosthetic.  Otherwise, she seems to be doing pretty well.  She is having no problems with fever.  Her last alpha-fetoprotein on 11/08/2022 was 6.3..  She has had no problems with abdominal pain.  Her blood sugars are on the little bit on the higher side..  She has had no rashes.  She has had no leg swelling.  She has had no bleeding.  I am just glad that her pain is gone now.  She was suffering because of the fungal infection of the right eye.  Of note, her last TSH was 11.8.  We do have her on Synthroid.  Currently, I would have to say that her performance status is probably ECOG 1.     Medications:  Allergies as of 12/13/2022       Reactions   Voriconazole Shortness Of Breath, Swelling   Patient reported that tablets sent her to the ER   Bactrim [sulfamethoxazole-trimethoprim] Swelling, Other (See Comments)   Site of swelling not recalled   Clarithromycin Swelling, Rash, Other (See Comments)   Daughter is unsure if Clarithromycin or Amoxicillin caused rash and swelling. Patient states no allergy to amoxicillan - has taken this recently with no issues. (12/02/21)        Medication List        Accurate as of  December 13, 2022 11:38 AM. If you have any questions, ask your nurse or doctor.          bacitracin-polymyxin b ophthalmic ointment Commonly known as: POLYSPORIN Apply 0.5 inch ribbon to affected eye(s) 4 (four) times a day.   bacitracin-polymyxin b ophthalmic ointment Commonly known as: POLYSPORIN Apply 1 Application to eye.   Basaglar KwikPen 100 UNIT/ML Inject 12 Units into the skin in the morning.   Basaglar KwikPen 100 UNIT/ML Inject 12 Units into the skin daily.   brimonidine 0.15 % ophthalmic solution Commonly known as: ALPHAGAN Place into the left eye 2 (two) times daily.   cephALEXin 500 MG capsule Commonly known as: KEFLEX Take 1 capsule (500 mg total) by mouth 2 (two) times daily.   diphenhydramine-acetaminophen 25-500 MG Tabs tablet Commonly known as: TYLENOL PM Take by mouth.   docusate sodium 100 MG capsule Commonly known as: Colace Take 1 capsule (100 mg total) by mouth daily.   EPCLUSA PO   erythromycin ophthalmic ointment Place  0.5 inch ribbon to the right eye 4 (four) times daily.   gabapentin 300 MG capsule Commonly known as: NEURONTIN Take 1 capsule (300 mg total) by mouth 3 (three) times daily.   HumaLOG KwikPen 200 UNIT/ML KwikPen Generic drug: insulin lispro Max daily dose 100 units. What changed:  how much to take how to take this when to take this additional instructions   levothyroxine 75 MCG tablet Commonly known as: Synthroid Take 1 tablet (75 mcg total) by mouth daily before breakfast.   lidocaine-prilocaine cream Commonly known as: EMLA Apply 1 Application topically as needed (for port access).   LINZESS PO   losartan 100 MG tablet Commonly known as: COZAAR Take 1 tablet (100 mg total) by mouth at bedtime.   methylPREDNISolone 4 MG Tbpk tablet Commonly known as: Medrol Start Saturday morning, use as directed. Measure blood sugars   neomycin-polymyxin b-dexamethasone 3.5-10000-0.1 Susp Commonly known as:  MAXITROL Place 1 drop into the right eye 4  times daily for 7 days, THEN 1 drop 3  times daily for 7 days, THEN 1 drop 2  times daily for 7 days, THEN 1 drop daily for 7 days. Start taking on: November 25, 2022   NIFEdipine 30 MG 24 hr tablet Commonly known as: PROCARDIA-XL/NIFEDICAL-XL Take 1 tablet (30 mg total) by mouth in the morning and at bedtime.   ondansetron 4 MG disintegrating tablet Commonly known as: ZOFRAN-ODT Take 1 tablet (4 mg total) by mouth every 8 (eight) hours as needed for post operative nausea   Opdivo 100 MG/10ML Soln chemo injection Generic drug: nivolumab Inject into the vein.   oxyCODONE-acetaminophen 5-325 MG tablet Commonly known as: PERCOCET/ROXICET Take 1 tablet by mouth every 4 (four) hours as needed for moderate pain (4-6).   oxyCODONE-acetaminophen 5-325 MG tablet Commonly known as: PERCOCET/ROXICET Take 1 tablet by mouth every 4 (four) hours as needed for moderate pain (4-6).   oxyCODONE-acetaminophen 5-325 MG tablet Commonly known as: PERCOCET/ROXICET Take 1 tablet by mouth every 6 (six) hours as needed.   pantoprazole 40 MG tablet Commonly known as: Protonix Take 1 tablet (40 mg total) by mouth 2 (two) times daily.   polyethylene glycol powder 17 GM/SCOOP powder Commonly known as: GLYCOLAX/MIRALAX Take 17 g by mouth daily as needed for mild constipation.   prednisoLONE acetate 1 % ophthalmic suspension Commonly known as: PRED FORTE Place 1 drop into the right eye 2 (two) times daily.   Refresh Tears 0.5 % Soln Generic drug: carboxymethylcellulose Place 1 drop into the right eye every 2 (two) hours.   TechLite Plus Pen Needles 32G X 4 MM Misc Generic drug: Insulin Pen Needle Use with insulins daily.   True Metrix Blood Glucose Test test strip Generic drug: glucose blood Use as instructed   Vitamin D3 10 MCG (400 UNIT) tablet Take by mouth.        Allergies:  Allergies  Allergen Reactions   Voriconazole Shortness Of Breath  and Swelling    Patient reported that tablets sent her to the ER   Bactrim [Sulfamethoxazole-Trimethoprim] Swelling and Other (See Comments)    Site of swelling not recalled   Clarithromycin Swelling, Rash and Other (See Comments)    Daughter is unsure if Clarithromycin or Amoxicillin caused rash and swelling.  Patient states no allergy to amoxicillan - has taken this recently with no issues. (12/02/21)    Past Medical History, Surgical history, Social history, and Family History were reviewed and updated.  Review of Systems: Review of Systems  Constitutional: Negative.   HENT: Negative.    Eyes: Negative.   Respiratory: Negative.    Cardiovascular: Negative.   Gastrointestinal: Negative.   Genitourinary: Negative.   Musculoskeletal: Negative.   Skin: Negative.   Neurological:  Positive for tingling.  Endo/Heme/Allergies: Negative.   Psychiatric/Behavioral: Negative.  Physical Exam: Vital signs with temperature of 97.6.  Pulse 82.  Blood pressure 134/71.  Weight is 155 pounds.   Wt Readings from Last 3 Encounters:  12/13/22 155 lb (70.3 kg)  12/01/22 156 lb (70.8 kg)  11/08/22 152 lb (68.9 kg)    Physical Exam Vitals reviewed.  HENT:     Head: Normocephalic and atraumatic.  Eyes:     Pupils: Pupils are equal, round, and reactive to light.     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     Cardiovascular:     Rate and Rhythm: Normal rate and regular rhythm.     Heart sounds: Normal heart sounds.  Pulmonary:     Effort: Pulmonary effort is normal.     Breath sounds: Normal breath sounds.  Abdominal:     General: Bowel sounds are normal.     Palpations: Abdomen is soft.     Comments: Abdominal exam shows a well-healed laparotomy scar.  She has no fluid wave.  There is no guarding or rebound tenderness to palpation.  There is no palpable liver or spleen tip.  Musculoskeletal:        General: No  tenderness or deformity. Normal range of motion.     Cervical back: Normal range of motion.  Lymphadenopathy:     Cervical: No cervical adenopathy.  Skin:    General: Skin is warm and dry.     Findings: No erythema or rash.     Comments: Skin exam does show a extensive but somewhat healing papular rash on her lower legs.  This is below the knees.  She has some dressing on the foot on the right foot.  She has no open wounds.  There is no blisters.  Neurological:     Mental Status: She is alert and oriented to person, place, and time.  Psychiatric:        Behavior: Behavior normal.        Thought Content: Thought content normal.        Judgment: Judgment normal.       Lab Results  Component Value Date   WBC 5.0 12/13/2022   HGB 12.4 12/13/2022   HCT 38.3 12/13/2022   MCV 87.8 12/13/2022   PLT 190 12/13/2022   Lab Results  Component Value Date   FERRITIN 603 (H) 10/02/2020   IRON 31 (L) 10/02/2020   TIBC 241 10/02/2020   UIBC 211 10/02/2020   IRONPCTSAT 13 (L) 10/02/2020   Lab Results  Component Value Date   RBC 4.36 12/13/2022   No results found for: "KPAFRELGTCHN", "LAMBDASER", "KAPLAMBRATIO" No results found for: "IGGSERUM", "IGA", "IGMSERUM" No results found for: "TOTALPROTELP", "ALBUMINELP", "A1GS", "A2GS", "BETS", "BETA2SER", "GAMS", "MSPIKE", "SPEI"   Chemistry      Component Value Date/Time   NA 139 12/13/2022 1022   NA 140 01/22/2022 0849   K 3.9 12/13/2022 1022   CL 106 12/13/2022 1022   CO2 26 12/13/2022 1022   BUN 16 12/13/2022 1022   BUN 11 01/22/2022 0849   CREATININE 0.62 12/13/2022 1022   CREATININE 0.58 (L) 08/20/2021 1541      Component Value Date/Time   CALCIUM 10.1 12/13/2022 1022   ALKPHOS 96 12/13/2022 1022   AST 13 (L) 12/13/2022 1022   ALT 13 12/13/2022 1022   BILITOT 0.5 12/13/2022 1022       Impression and Plan: [name] is a very pleasant 77 yo female  from [place] with hepatocellular carcinoma and Hepatitis C.   She actually has had treatment for the Hepatitis C.  I am sure that this is part of the reason why she has done so well with response.  Again, the hepatocellular carcinoma really is doing quite nicely.  Thankfully, the right eye has been taken care of.  I hate that she need to have an enucleation for this.  Of note, her last MRI was just done about 6 weeks ago.  I do not think we need another 1 until probably December.  We will go ahead with her nivolumab.  I do not see a problem with her having the nivolumab.  We will have her come back to see [place] in 1 month.[name], MD 10/28/202411:38 AM

## 2022-12-13 NOTE — Patient Instructions (Addendum)
[facility]  Discharge Instructions: Thank you for choosing [facility] to provide your oncology and hematology care.   If you have a lab appointment with the Cancer Center, please go directly to the Cancer Center and check in at the registration area.  Wear comfortable clothing and clothing appropriate for easy access to any Portacath or PICC line.   We strive to give you quality time with your provider. You may need to reschedule your appointment if you arrive late (15 or more minutes).  Arriving late affects you and other patients whose appointments are after yours.  Also, if you miss three or more appointments without notifying the office, you may be dismissed from the clinic at the provider's discretion.      For prescription refill requests, have your pharmacy contact our office and allow 72 hours for refills to be completed.    Today you received the following chemotherapy and/or immunotherapy agents:  Nivolumab      To help prevent nausea and vomiting after your treatment, we encourage you to take your nausea medication as directed.  BELOW ARE SYMPTOMS THAT SHOULD BE REPORTED IMMEDIATELY: *FEVER GREATER THAN 100.4 F (38 C) OR HIGHER *CHILLS OR SWEATING *NAUSEA AND VOMITING THAT IS NOT CONTROLLED WITH YOUR NAUSEA MEDICATION *UNUSUAL SHORTNESS OF BREATH *UNUSUAL BRUISING OR BLEEDING *URINARY PROBLEMS (pain or burning when urinating, or frequent urination) *BOWEL PROBLEMS (unusual diarrhea, constipation, pain near the anus) TENDERNESS IN MOUTH AND THROAT WITH OR WITHOUT PRESENCE OF ULCERS (sore throat, sores in mouth, or a toothache) UNUSUAL RASH, SWELLING OR PAIN  UNUSUAL VAGINAL DISCHARGE OR ITCHING   Items with * indicate a potential emergency and should be followed up as soon as possible or go to the Emergency Department if any problems should occur.  Please show the CHEMOTHERAPY ALERT CARD or IMMUNOTHERAPY ALERT CARD at  check-in to the Emergency Department and triage nurse. Should you have questions after your visit or need to cancel or reschedule your appointment, please contact [facility]  [phone] and follow the prompts.  Office hours are 8:00 a.m. to 4:30 p.m. Monday - Friday. Please note that voicemails left after 4:00 p.m. may not be returned until the following business day.  We are closed weekends and major holidays. You have access to a nurse at all times for urgent questions. Please call the main number to the clinic [phone] and follow the prompts.  For any non-urgent questions, you may also contact your provider using MyChart. We now offer e-Visits for anyone 18 and older to request care online for non-urgent symptoms. For details visit [email or web address].   Also download the MyChart app! Go to the app store, search "MyChart", open the app, select [facility], and log in with your MyChart username and password.   

## 2022-12-14 ENCOUNTER — Encounter: Payer: Self-pay | Admitting: Hematology & Oncology

## 2022-12-14 ENCOUNTER — Other Ambulatory Visit: Payer: Self-pay

## 2022-12-14 ENCOUNTER — Encounter: Payer: Self-pay | Admitting: *Deleted

## 2022-12-14 LAB — AFP TUMOR MARKER: AFP, Serum, Tumor Marker: 5.7 ng/mL (ref 0.0–9.2)

## 2022-12-15 ENCOUNTER — Other Ambulatory Visit: Payer: Self-pay

## 2022-12-16 ENCOUNTER — Other Ambulatory Visit: Payer: Self-pay

## 2022-12-17 ENCOUNTER — Other Ambulatory Visit: Payer: Self-pay

## 2022-12-23 ENCOUNTER — Other Ambulatory Visit: Payer: Self-pay

## 2022-12-23 ENCOUNTER — Encounter: Payer: Self-pay | Admitting: Hematology & Oncology

## 2022-12-27 ENCOUNTER — Other Ambulatory Visit: Payer: Self-pay

## 2022-12-28 ENCOUNTER — Encounter: Payer: Self-pay | Admitting: Hematology & Oncology

## 2023-01-11 ENCOUNTER — Encounter: Payer: Self-pay | Admitting: Hematology & Oncology

## 2023-01-11 ENCOUNTER — Other Ambulatory Visit: Payer: Self-pay

## 2023-01-12 ENCOUNTER — Inpatient Hospital Stay: Payer: Self-pay

## 2023-01-12 ENCOUNTER — Other Ambulatory Visit: Payer: Self-pay

## 2023-01-12 ENCOUNTER — Inpatient Hospital Stay (HOSPITAL_BASED_OUTPATIENT_CLINIC_OR_DEPARTMENT_OTHER): Payer: Self-pay | Admitting: Hematology & Oncology

## 2023-01-12 ENCOUNTER — Encounter: Payer: Self-pay | Admitting: Hematology & Oncology

## 2023-01-12 ENCOUNTER — Inpatient Hospital Stay: Payer: Self-pay | Attending: Hematology & Oncology

## 2023-01-12 VITALS — BP 143/62 | HR 93 | Resp 16 | Ht 64.0 in | Wt 163.0 lb

## 2023-01-12 DIAGNOSIS — C220 Liver cell carcinoma: Secondary | ICD-10-CM

## 2023-01-12 DIAGNOSIS — Z452 Encounter for adjustment and management of vascular access device: Secondary | ICD-10-CM | POA: Insufficient documentation

## 2023-01-12 DIAGNOSIS — E038 Other specified hypothyroidism: Secondary | ICD-10-CM

## 2023-01-12 DIAGNOSIS — B1920 Unspecified viral hepatitis C without hepatic coma: Secondary | ICD-10-CM | POA: Insufficient documentation

## 2023-01-12 LAB — CBC WITH DIFFERENTIAL (CANCER CENTER ONLY)
Abs Immature Granulocytes: 0 10*3/uL (ref 0.00–0.07)
Basophils Absolute: 0 10*3/uL (ref 0.0–0.1)
Basophils Relative: 0 %
Eosinophils Absolute: 0.1 10*3/uL (ref 0.0–0.5)
Eosinophils Relative: 1 %
HCT: 37.4 % (ref 36.0–46.0)
Hemoglobin: 12.1 g/dL (ref 12.0–15.0)
Immature Granulocytes: 0 %
Lymphocytes Relative: 32 %
Lymphs Abs: 1.7 10*3/uL (ref 0.7–4.0)
MCH: 28.1 pg (ref 26.0–34.0)
MCHC: 32.4 g/dL (ref 30.0–36.0)
MCV: 86.8 fL (ref 80.0–100.0)
Monocytes Absolute: 0.3 10*3/uL (ref 0.1–1.0)
Monocytes Relative: 6 %
Neutro Abs: 3.2 10*3/uL (ref 1.7–7.7)
Neutrophils Relative %: 61 %
Platelet Count: 184 10*3/uL (ref 150–400)
RBC: 4.31 MIL/uL (ref 3.87–5.11)
RDW: 15 % (ref 11.5–15.5)
WBC Count: 5.3 10*3/uL (ref 4.0–10.5)
nRBC: 0 % (ref 0.0–0.2)

## 2023-01-12 LAB — CMP (CANCER CENTER ONLY)
ALT: 24 U/L (ref 0–44)
AST: 21 U/L (ref 15–41)
Albumin: 4 g/dL (ref 3.5–5.0)
Alkaline Phosphatase: 98 U/L (ref 38–126)
Anion gap: 8 (ref 5–15)
BUN: 19 mg/dL (ref 8–23)
CO2: 23 mmol/L (ref 22–32)
Calcium: 10.4 mg/dL — ABNORMAL HIGH (ref 8.9–10.3)
Chloride: 109 mmol/L (ref 98–111)
Creatinine: 0.6 mg/dL (ref 0.44–1.00)
GFR, Estimated: >60 mL/min (ref >60)
Glucose, Bld: 155 mg/dL — ABNORMAL HIGH (ref 70–99)
Potassium: 3.6 mmol/L (ref 3.5–5.1)
Sodium: 140 mmol/L (ref 135–145)
Total Bilirubin: 0.5 mg/dL (ref <1.2)
Total Protein: 6.9 g/dL (ref 6.5–8.1)

## 2023-01-12 LAB — LACTATE DEHYDROGENASE: LDH: 178 U/L (ref 98–192)

## 2023-01-12 LAB — TSH: TSH: 2.845 u[IU]/mL (ref 0.350–4.500)

## 2023-01-12 MED ORDER — SODIUM CHLORIDE 0.9% FLUSH
10.0000 mL | Freq: Once | INTRAVENOUS | Status: AC | PRN
  Administered 2023-01-12: 10 mL

## 2023-01-12 MED ORDER — HEPARIN SOD (PORK) LOCK FLUSH 100 UNIT/ML IV SOLN
250.0000 [IU] | Freq: Once | INTRAVENOUS | Status: AC | PRN
  Administered 2023-01-12: 500 [IU]

## 2023-01-12 MED ORDER — DOXYCYCLINE HYCLATE 100 MG PO TABS
100.0000 mg | ORAL_TABLET | Freq: Two times a day (BID) | ORAL | 1 refills | Status: DC
  Filled 2023-01-12: qty 20, 10d supply, fill #0

## 2023-01-12 MED ORDER — PREDNISONE 20 MG PO TABS
40.0000 mg | ORAL_TABLET | Freq: Every day | ORAL | 3 refills | Status: DC
  Filled 2023-01-12: qty 60, 30d supply, fill #0

## 2023-01-12 NOTE — Patient Instructions (Signed)

## 2023-01-12 NOTE — Progress Notes (Signed)
Yes for surgery okay so I think should be a problem I got yeah it probably it certainly could get a biopsy to be sent off are molecular studies that may help [place] figure all Hematology and Oncology Follow Up Visit  [name] [name] [name] [name] [id number] 1945/02/24 77 y.o. 01/12/2023   Principle Diagnosis:  Hepatocellular carcinoma-multifocal Hepatitis C   Current Therapy:        Status post cycle 1 of nivolumab/ipilimumab -- d/c on 10/13/2020 due to hepatic toxicity Nivolumab 480 mg IV every 4 weeks - s/p cycle #22 -- started 11/21/2020 --changed to 480 mg on 08/2021 Nexavar 200 mg po BID -- to be taken while in [place] -- start on 02/26/2022 -- d/c on 06/16/2022   Interim History:  [name] [name] [name] is here today for follow-up.  She actually is doing a bit better.  There is no pain with the right eye.  She had the eye enucleated back about 6 weeks ago.  She cannot have the prosthesis put in on December 11..  The 1 probably she has that she is developing this papular rash on her legs again.  She is off prednisone.  Her Poliquin had to get her back onto prednisone.  Her last alpha-fetoprotein was only 5.7.  As such, I think that we can hold her nivolumab for right now.  She has had no problems with cough or shortness of breath.  She has had no change in bowel or bladder habits.  She has had no bleeding.  She has had no fever.  She has had no swollen lymph nodes.  Overall, I would have to say that her performance status is probably ECOG 1.       Medications:  Allergies as of 01/12/2023       Reactions   Voriconazole Shortness Of Breath, Swelling   Patient reported that tablets sent her to the ER   Bactrim [sulfamethoxazole-trimethoprim] Swelling, Other (See Comments)   Site of swelling not recalled   Clarithromycin Swelling, Rash, Other (See Comments)   Daughter is unsure if Clarithromycin or Amoxicillin caused rash and swelling. Patient states no allergy to amoxicillan  - has taken this recently with no issues. (12/02/21)        Medication List        Accurate as of January 12, 2023 10:59 AM. If you have any questions, ask your nurse or doctor.          bacitracin-polymyxin b ophthalmic ointment Commonly known as: POLYSPORIN Apply 0.5 inch ribbon to affected eye(s) 4 (four) times a day.   bacitracin-polymyxin b ophthalmic ointment Commonly known as: POLYSPORIN Apply 1 Application to eye.   Basaglar KwikPen 100 UNIT/ML Inject 12 Units into the skin in the morning.   Basaglar KwikPen 100 UNIT/ML Inject 12 Units into the skin daily.   brimonidine 0.15 % ophthalmic solution Commonly known as: ALPHAGAN Place into the left eye 2 (two) times daily.   cephALEXin 500 MG capsule Commonly known as: KEFLEX Take 1 capsule (500 mg total) by mouth 2 (two) times daily.   diphenhydramine-acetaminophen 25-500 MG Tabs tablet Commonly known as: TYLENOL PM Take by mouth.   docusate sodium 100 MG capsule Commonly known as: Colace Take 1 capsule (100 mg total) by mouth daily.   EPCLUSA PO   erythromycin ophthalmic ointment Place  0.5 inch ribbon to the right eye 4 (four) times daily.   gabapentin 300 MG capsule Commonly known as: NEURONTIN Take 1 capsule (300 mg total) by  mouth 3 (three) times daily.   HumaLOG KwikPen 200 UNIT/ML KwikPen Generic drug: insulin lispro Max daily dose 100 units. What changed:  how much to take how to take this when to take this additional instructions   levothyroxine 75 MCG tablet Commonly known as: Synthroid Take 1 tablet (75 mcg total) by mouth daily before breakfast.   lidocaine-prilocaine cream Commonly known as: EMLA Apply 1 Application topically as needed (for port access).   LINZESS PO   losartan 100 MG tablet Commonly known as: COZAAR Take 1 tablet (100 mg total) by mouth at bedtime.   methylPREDNISolone 4 MG Tbpk tablet Commonly known as: Medrol Start Saturday morning, use as directed.  Measure blood sugars   neomycin-polymyxin b-dexamethasone 3.5-10000-0.1 Susp Commonly known as: MAXITROL Place 1 drop into the right eye 4  times daily for 7 days, THEN 1 drop 3  times daily for 7 days, THEN 1 drop 2  times daily for 7 days, THEN 1 drop daily for 7 days. Start taking on: November 25, 2022   NIFEdipine 30 MG 24 hr tablet Commonly known as: PROCARDIA-XL/NIFEDICAL-XL Take 1 tablet (30 mg total) by mouth in the morning and at bedtime.   ondansetron 4 MG disintegrating tablet Commonly known as: ZOFRAN-ODT Take 1 tablet (4 mg total) by mouth every 8 (eight) hours as needed for post operative nausea   Opdivo 100 MG/10ML Soln chemo injection Generic drug: nivolumab Inject into the vein.   oxyCODONE-acetaminophen 5-325 MG tablet Commonly known as: PERCOCET/ROXICET Take 1 tablet by mouth every 4 (four) hours as needed for moderate pain (4-6).   oxyCODONE-acetaminophen 5-325 MG tablet Commonly known as: PERCOCET/ROXICET Take 1 tablet by mouth every 4 (four) hours as needed for moderate pain (4-6).   oxyCODONE-acetaminophen 5-325 MG tablet Commonly known as: PERCOCET/ROXICET Take 1 tablet by mouth every 6 (six) hours as needed.   pantoprazole 40 MG tablet Commonly known as: Protonix Take 1 tablet (40 mg total) by mouth 2 (two) times daily.   polyethylene glycol powder 17 GM/SCOOP powder Commonly known as: GLYCOLAX/MIRALAX Take 17 g by mouth daily as needed for mild constipation.   prednisoLONE acetate 1 % ophthalmic suspension Commonly known as: PRED FORTE Place 1 drop into the right eye 2 (two) times daily.   Refresh Tears 0.5 % Soln Generic drug: carboxymethylcellulose Place 1 drop into the right eye every 2 (two) hours.   TechLite Plus Pen Needles 32G X 4 MM Misc Generic drug: Insulin Pen Needle Use with insulins daily.   True Metrix Blood Glucose Test test strip Generic drug: glucose blood Use as instructed   Vitamin D3 10 MCG (400 UNIT) tablet Take by  mouth.        Allergies:  Allergies  Allergen Reactions   Voriconazole Shortness Of Breath and Swelling    Patient reported that tablets sent her to the ER   Bactrim [Sulfamethoxazole-Trimethoprim] Swelling and Other (See Comments)    Site of swelling not recalled   Clarithromycin Swelling, Rash and Other (See Comments)    Daughter is unsure if Clarithromycin or Amoxicillin caused rash and swelling.  Patient states no allergy to amoxicillan - has taken this recently with no issues. (12/02/21)    Past Medical History, Surgical history, Social history, and Family History were reviewed and updated.  Review of Systems: Review of Systems  Constitutional: Negative.   HENT: Negative.    Eyes: Negative.   Respiratory: Negative.    Cardiovascular: Negative.   Gastrointestinal: Negative.   Genitourinary: Negative.  Musculoskeletal: Negative.   Skin: Negative.   Neurological:  Positive for tingling.  Endo/Heme/Allergies: Negative.   Psychiatric/Behavioral: Negative.       Physical Exam: Vital signs with temperature of 98.  Pulse 93.  Blood pressure 143/64.  Weight is 163 pounds.    Wt Readings from Last 3 Encounters:  01/12/23 163 lb (73.9 kg)  12/13/22 155 lb (70.3 kg)  12/01/22 156 lb (70.8 kg)    Physical Exam Vitals reviewed.  HENT:     Head: Normocephalic and atraumatic.  Eyes:     Pupils: Pupils are equal, round, and reactive to light.     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     Cardiovascular:     Rate and Rhythm: Normal rate and regular rhythm.     Heart sounds: Normal heart sounds.  Pulmonary:     Effort: Pulmonary effort is normal.     Breath sounds: Normal breath sounds.  Abdominal:     General: Bowel sounds are normal.     Palpations: Abdomen is soft.     Comments: Abdominal exam shows a well-healed laparotomy scar.  She has no fluid wave.  There is no guarding or rebound tenderness to  palpation.  There is no palpable liver or spleen tip.  Musculoskeletal:        General: No tenderness or deformity. Normal range of motion.     Cervical back: Normal range of motion.  Lymphadenopathy:     Cervical: No cervical adenopathy.  Skin:    General: Skin is warm and dry.     Findings: No erythema or rash.     Comments: Skin exam does show a  papular rash on her lower legs.  This is above and below the knees.  These lesions are somewhat firm.  They are raised.  They are none tender.    Neurological:     Mental Status: She is alert and oriented to person, place, and time.  Psychiatric:        Behavior: Behavior normal.        Thought Content: Thought content normal.        Judgment: Judgment normal.       Lab Results  Component Value Date   WBC 5.3 01/12/2023   HGB 12.1 01/12/2023   HCT 37.4 01/12/2023   MCV 86.8 01/12/2023   PLT 184 01/12/2023   Lab Results  Component Value Date   FERRITIN 603 (H) 10/02/2020   IRON 31 (L) 10/02/2020   TIBC 241 10/02/2020   UIBC 211 10/02/2020   IRONPCTSAT 13 (L) 10/02/2020   Lab Results  Component Value Date   RBC 4.31 01/12/2023   No results found for: "KPAFRELGTCHN", "LAMBDASER", "KAPLAMBRATIO" No results found for: "IGGSERUM", "IGA", "IGMSERUM" No results found for: "TOTALPROTELP", "ALBUMINELP", "A1GS", "A2GS", "BETS", "BETA2SER", "GAMS", "MSPIKE", "SPEI"   Chemistry      Component Value Date/Time   NA 140 01/12/2023 1010   NA 140 01/22/2022 0849   K 3.6 01/12/2023 1010   CL 109 01/12/2023 1010   CO2 23 01/12/2023 1010   BUN 19 01/12/2023 1010   BUN 11 01/22/2022 0849   CREATININE 0.60 01/12/2023 1010   CREATININE 0.58 (L) 08/20/2021 1541      Component Value Date/Time   CALCIUM 10.4 (H) 01/12/2023 1010   ALKPHOS 98 01/12/2023 1010   AST 21 01/12/2023 1010   ALT 24 01/12/2023 1010   BILITOT 0.5 01/12/2023  1010       Impression and Plan: Ms. Lazaro Arms is a very pleasant 77 yo female from [place]  with hepatocellular carcinoma and Hepatitis C.  She actually has had treatment for the Hepatitis C.  I am sure that this is part of the reason why she has done so well with response.  Again, the hepatocellular carcinoma really is doing quite nicely.  Again, we are going to hold her treatment right now.  I will get her on prednisone at 40 mg a day.  I told her that she MUST see her dermatologist next week to have further evaluation of this papular lesions.  I am also going to give her some doxycycline (100 mg p.o. twice daily) to help.  Again I do not see a problem with holding the nivolumab for right now.  She has done incredibly well.  I really do not think that she is going to have any kind of progression of her malignancy.  We will go ahead and plan to get her back in 3 weeks.  We will see how everything looks at that time.  If everything looks better, then we will see about the nivolumab.   [name], MD 11/27/202410:59 AM

## 2023-01-12 NOTE — Addendum Note (Signed)
Addended by: Rozell Searing on: 01/12/2023 11:26 AM   Modules accepted: Orders

## 2023-01-13 LAB — AFP TUMOR MARKER: AFP, Serum, Tumor Marker: 5.8 ng/mL (ref 0.0–9.2)

## 2023-01-17 ENCOUNTER — Telehealth: Payer: Self-pay

## 2023-01-17 NOTE — Telephone Encounter (Signed)
-----   Message from [name] sent at 01/12/2023  4:35 PM EST ----- Please call and let her know that the thyroid function is perfect now.  Cindee Lame

## 2023-01-17 NOTE — Telephone Encounter (Signed)
Advised via MyChart.

## 2023-01-19 ENCOUNTER — Encounter: Payer: Self-pay | Admitting: Hematology & Oncology

## 2023-01-19 ENCOUNTER — Other Ambulatory Visit: Payer: Self-pay

## 2023-01-19 MED ORDER — PREDNISONE 20 MG PO TABS
ORAL_TABLET | ORAL | 0 refills | Status: AC
  Filled 2023-01-19: qty 15, 15d supply, fill #0

## 2023-01-25 ENCOUNTER — Encounter: Payer: Self-pay | Admitting: Hematology & Oncology

## 2023-01-26 ENCOUNTER — Other Ambulatory Visit: Payer: Self-pay

## 2023-01-26 ENCOUNTER — Encounter: Payer: Self-pay | Admitting: Hematology & Oncology

## 2023-01-27 ENCOUNTER — Ambulatory Visit: Payer: Self-pay | Attending: Internal Medicine | Admitting: Internal Medicine

## 2023-01-27 ENCOUNTER — Other Ambulatory Visit: Payer: Self-pay

## 2023-01-27 ENCOUNTER — Encounter: Payer: Self-pay | Admitting: Internal Medicine

## 2023-01-27 ENCOUNTER — Encounter: Payer: Self-pay | Admitting: Hematology & Oncology

## 2023-01-27 VITALS — BP 157/67 | HR 80 | Temp 98.2°F | Ht 64.0 in | Wt 165.0 lb

## 2023-01-27 DIAGNOSIS — L280 Lichen simplex chronicus: Secondary | ICD-10-CM

## 2023-01-27 DIAGNOSIS — I152 Hypertension secondary to endocrine disorders: Secondary | ICD-10-CM

## 2023-01-27 DIAGNOSIS — Z9001 Acquired absence of eye: Secondary | ICD-10-CM

## 2023-01-27 DIAGNOSIS — E039 Hypothyroidism, unspecified: Secondary | ICD-10-CM

## 2023-01-27 DIAGNOSIS — Z7984 Long term (current) use of oral hypoglycemic drugs: Secondary | ICD-10-CM

## 2023-01-27 DIAGNOSIS — E1142 Type 2 diabetes mellitus with diabetic polyneuropathy: Secondary | ICD-10-CM

## 2023-01-27 DIAGNOSIS — E1159 Type 2 diabetes mellitus with other circulatory complications: Secondary | ICD-10-CM

## 2023-01-27 DIAGNOSIS — C220 Liver cell carcinoma: Secondary | ICD-10-CM

## 2023-01-27 LAB — GLUCOSE, POCT (MANUAL RESULT ENTRY): POC Glucose: 177 mg/dL — AB (ref 70–99)

## 2023-01-27 LAB — POCT GLYCOSYLATED HEMOGLOBIN (HGB A1C): HbA1c, POC (controlled diabetic range): 7.3 % — AB (ref 0.0–7.0)

## 2023-01-27 MED ORDER — HYDRALAZINE HCL 10 MG PO TABS
10.0000 mg | ORAL_TABLET | Freq: Two times a day (BID) | ORAL | 1 refills | Status: DC
  Filled 2023-01-27: qty 180, 90d supply, fill #0
  Filled 2023-02-28 – 2023-05-02 (×2): qty 180, 90d supply, fill #1

## 2023-01-27 NOTE — Progress Notes (Signed)
Patient ID: [name] [name], female    DOB: August 23, 1945  MRN: [id number]  CC: Diabetes (DM f/u. Franchot Erichsen mammogram/Skin concerns of L & R legs, itchinesss, X2 mo)   Subjective: [name] [name] is a 77 y.o. female who presents for chronic ds management. Daughter is with her. 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    Discussed the use of AI scribe software for clinical note transcription with the patient, who gave verbal consent to proceed.  History of Present Illness    The patient recently had her right eye removed due to severe pain. The surgery was successful and the patient reports no post-operative pain. However, following the surgery, the patient was put on prednisone.  Once she finished a course of prednisone, skin lesions on her legs which were stable and dried up were exacerbated.  The lesions started increasing in size and number, prompting the dermatologist to put her back on prednisone starting at 40 mg and is gradually tapering.  Currently on 20 mg daily. -Prosthetic eye fitting is scheduled for January at [facility] in [place].  DM:  Results for orders placed or performed in visit on 01/27/23  POCT glucose (manual entry)   Collection Time: 01/27/23  3:05 PM  Result Value Ref Range   POC Glucose 177 (A) 70 - 99 mg/dl  POCT glycosylated hemoglobin (Hb A1C)   Collection Time: 01/27/23  3:51 PM  Result Value Ref Range   Hemoglobin A1C     HbA1c POC (<> result, manual entry)     HbA1c, POC (prediabetic range)     HbA1c, POC (controlled diabetic range) 7.3 (A) 0.0 - 7.0 %     The patient's diabetes has been  affected by the prednisone, with blood sugars rising significantly. The patient checks her blood sugars three times a day and reports morning readings ranging from 143-160 mg/dL and evening readings reaching up to 200 mg/dL. The patient is on Lantus 12 units and Humalog insulin 12 units TID with SS.  Last saw her her endocrinologist Dr. Brooks Sailors 12/01/2022 before she was placed on prednisone.[name]  HTN:  checks BP few times a day Reports readings ranging from 138-155/75-76 mmHg. The patient is on Losartan 100 mg and Procardia 30 mg BID for blood pressure management.  Limits salt in foods  The patient's thyroid function was found to be low during a routine check by the oncologist. The patient was started on levothyroxine 75 mcg and recent tests show that the thyroid levels have normalized.  She is on immunologic therapy for history of hepatocellular carcinoma.  She does not recall whether the oncologist thought they acquired hypothyroidism was caused by the immunotherapy that she gets once a month.  Last immunotherapy infusion was held due to flareup of lichenoid dermatitis on her legs.  This is being managed by dermatology as mentioned above.  HM: On last visit we had talked about her having 1 more mammogram since she had been screened only 1 time her entire life.  First when she got through [facility].  I had communicated with them and requested that she be allowed to have 1 more mammogram but daughter states they were never called.  She will call  them.     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    Hypercalcemia 09/22/2020   Chronic hepatitis C with hepatic coma (HCC) 09/22/2020   Goals of care, counseling/discussion 09/01/2020   Hepatocellular carcinoma (HCC) 08/14/2020   Hyperglycemia 08/10/2020   Pneumoperitoneum 08/09/2020   History of colorectal cancer 08/09/2020   Liver mass, left lobe 08/09/2020   Insulin-requiring or dependent type II diabetes mellitus (HCC) 08/09/2020   Hypertension associated with diabetes (HCC) 08/09/2020   Constipation, chronic 08/09/2020   Perforated gastric ulcer s/p lap omental Graham patch 08/09/2020 08/09/2020     Current Outpatient Medications on File Prior to Visit  Medication Sig Dispense Refill   bacitracin-polymyxin b (POLYSPORIN) ophthalmic ointment Apply 0.5 inch ribbon to affected eye(s) 4 (four) times a day. 3.5 g 11   bacitracin-polymyxin b (POLYSPORIN) ophthalmic ointment Apply 1 Application to eye.     brimonidine (ALPHAGAN) 0.15 % ophthalmic solution Place into the left eye 2 (two) times daily.     Cholecalciferol (VITAMIN D3) 10 MCG (400 UNIT) tablet Take by mouth.     diphenhydramine-acetaminophen (TYLENOL PM) 25-500 MG TABS tablet Take by mouth.     docusate sodium (COLACE) 100 MG capsule Take 1 capsule (100 mg total) by mouth daily. 100 capsule 0   gabapentin (NEURONTIN) 300 MG capsule Take 1 capsule (300 mg total) by mouth 3 (three) times daily. 90 capsule 4   glucose blood (TRUE METRIX BLOOD GLUCOSE TEST) test strip Use as instructed 100 each 12   Insulin Glargine (BASAGLAR KWIKPEN) 100 UNIT/ML Inject 12 Units into the skin in the morning.     insulin glargine (LANTUS SOLOSTAR) 100 UNIT/ML Solostar Pen Inject 12 Units into the skin daily. 15 mL 6   insulin lispro (HUMALOG KWIKPEN) 200 UNIT/ML KwikPen Max daily dose 100 units. (Patient taking differently: Inject 12 Units into the skin See admin instructions. Inject 12 units into the skin three times a day before meals, plus a sliding scale for a BGL above 155. Taper as directed by MD  [name]. Max daily dose is 100 units. BGL <155 = give nothing; 156-180 = 1 unit; 181-205 = 2 units; 206-230 = 3 units; 231-255 = 4 units; 256-280 = 5 units; 281-305 = 6 units; 306-330 = 7 units; 331-355 = 8 units; 256-380 = 9 units. Each time Prednisone is decreased, please decrease Humalog by 4 units with each meal.) 60 mL 0   Insulin Pen Needle 32G X 4 MM MISC Use with insulins daily. 400 each 3   levothyroxine (SYNTHROID) 75 MCG tablet Take 1 tablet (75 mcg total) by mouth daily before breakfast. 30 tablet 3   linaCLOtide (LINZESS PO)      losartan (COZAAR) 100 MG tablet Take 1 tablet (100 mg total) by mouth at bedtime. 90 tablet 1   methylPREDNISolone (MEDROL) 4 MG TBPK tablet Start Saturday morning, use as directed. Measure blood sugars 21 tablet 0   NIFEdipine (PROCARDIA-XL/NIFEDICAL-XL) 30 MG 24 hr tablet Take 1 tablet (30 mg total) by mouth in the morning and at bedtime. 180 tablet 1   nivolumab (OPDIVO) 100 MG/10ML SOLN chemo injection Inject into the  vein.     ondansetron (ZOFRAN-ODT) 4 MG disintegrating tablet Take 1 tablet (4 mg total) by mouth every 8 (eight) hours as needed for post operative nausea 12 tablet 0   pantoprazole (PROTONIX) 40 MG tablet Take 1 tablet (40 mg total) by mouth 2 (two) times daily. 240 tablet 0   polyethylene glycol powder (GLYCOLAX/MIRALAX) 17 GM/SCOOP powder Take 17 g by mouth daily as needed for mild constipation.     predniSONE (DELTASONE) 20 MG tablet Take 2 tablets (40 mg total) by mouth daily with breakfast. 60 tablet 3   predniSONE (DELTASONE) 20 MG tablet Take 1.5 tablets (30 mg total) by mouth daily for 5 days, THEN 1 tablet (20 mg total) daily for 5 days, THEN 0.5 tablets (10 mg total) daily for 5 days. 15 tablet 0   REFRESH TEARS 0.5 % SOLN Place 1 drop into the right eye every 2 (two) hours.     Sofosbuvir-Velpatasvir (EPCLUSA PO)      doxycycline (VIBRA-TABS) 100 MG tablet Take 1 tablet (100 mg total) by mouth 2 (two) times daily. (Patient not  taking: Reported on 01/27/2023) 20 tablet 1   erythromycin ophthalmic ointment Place  0.5 inch ribbon to the right eye 4 (four) times daily. (Patient not taking: Reported on 01/27/2023) 3.5 g 5   lidocaine-prilocaine (EMLA) cream Apply 1 Application topically as needed (for port access). (Patient not taking: Reported on 01/27/2023)     Current Facility-Administered Medications on File Prior to Visit  Medication Dose Route Frequency Provider Last Rate Last Admin   sodium chloride flush (NS) 0.9 % injection 10 mL  10 mL Intravenous PRN Erenest Blank, NP   10 mL at 12/18/20 1009    Allergies  Allergen Reactions   Voriconazole Shortness Of Breath and Swelling    Patient reported that tablets sent her to the ER   Bactrim [Sulfamethoxazole-Trimethoprim] Swelling and Other (See Comments)    Site of swelling not recalled   Clarithromycin Swelling, Rash and Other (See Comments)    Daughter is unsure if Clarithromycin or Amoxicillin caused rash and swelling.  Patient states no allergy to amoxicillan - has taken this recently with no issues. (12/02/21)    Social History   Socioeconomic History   Marital status: Widowed    Spouse name: Not on file   Number of children: 5   Years of education: 14   Highest education level: Associate degree: occupational, Scientist, product/process development, or vocational program  Occupational History   Not on file  Tobacco Use   Smoking status: Never   Smokeless tobacco: Never  Vaping Use   Vaping status: Never Used  Substance and Sexual Activity   Alcohol use: Not Currently   Drug use: Never   Sexual activity: Not Currently  Other Topics Concern   Not on file  Social History Narrative   Originally from [place].  Speaks [place] and [place]   Social Drivers of Corporate investment banker Strain: Not on file  Food Insecurity: No Food Insecurity (08/21/2022)   Hunger Vital Sign    Worried About Running Out of Food in the Last Year: Never true    Ran Out of Food in the Last  Year: Never true  Transportation Needs: No Transportation Needs (08/21/2022)   PRAPARE - Administrator, Civil Service (Medical): No    Lack of Transportation (Non-Medical): No  Physical Activity: Not on file  Stress: Not on file  Social Connections: Not on file  Intimate Partner Violence:  Not At Risk (08/21/2022)   Humiliation, Afraid, Rape, and Kick questionnaire    Fear of Current or Ex-Partner: No    Emotionally Abused: No    Physically Abused: No    Sexually Abused: No    Family History  Problem Relation Age of Onset   Cancer Father        type unknown, was on the leg   Other Daughter        Acoustic neuroma   Breast cancer Neg Hx    Colon cancer Neg Hx    Esophageal cancer Neg Hx    Rectal cancer Neg Hx    Stomach cancer Neg Hx     Past Surgical History:  Procedure Laterality Date   COLON RESECTION  2005   In [place] - ?colon cancer   IR IMAGING GUIDED PORT INSERTION  09/18/2020   LAPAROSCOPIC GASTRIC RESECTION N/A 08/09/2020   Procedure: LAPAROSCOPIC EXPLORATION OF ABDOMEN, PERFORATED ULCER REPAIR, LIVER BIOPSY;  Surgeon: Karie Soda, MD;  Location: [facility];  Service: General;  Laterality: N/A;    ROS: Review of Systems Negative except as stated above  PHYSICAL EXAM: BP (!) 157/67   Pulse 80   Temp 98.2 F (36.8 C) (Oral)   Ht 5\' 4"  (1.626 m)   Wt 165 lb (74.8 kg)   SpO2 96%   BMI 28.32 kg/m   Physical Exam  General appearance - alert, well appearing, and in no distress Mental status - normal mood, behavior, speech, dress, motor activity, and thought processes Neck - supple, no significant adenopathy Chest - clear to auscultation, no wheezes, rales or rhonchi, symmetric air entry Heart - normal rate, regular rhythm, normal S1, S2, no murmurs, rubs, clicks or gallops Extremities - no LE edema Skin: Patient has raised hyperpigmented spots on the lower extremities.     Latest Ref Rng & Units 01/12/2023   10:10 AM 12/13/2022   10:22 AM  11/08/2022    8:20 AM  CMP  Glucose 70 - 99 mg/dL 664  403  474   BUN 8 - 23 mg/dL 19  16  12    Creatinine 0.44 - 1.00 mg/dL 2.59  5.63  8.75   Sodium 135 - 145 mmol/L 140  139  138   Potassium 3.5 - 5.1 mmol/L 3.6  3.9  3.8   Chloride 98 - 111 mmol/L 109  106  106   CO2 22 - 32 mmol/L 23  26  25    Calcium 8.9 - 10.3 mg/dL 64.3  32.9  9.9   Total Protein 6.5 - 8.1 g/dL 6.9  6.4  6.9   Total Bilirubin <1.2 mg/dL 0.5  0.5  0.6   Alkaline Phos 38 - 126 U/L 98  96  93   AST 15 - 41 U/L 21  13  20    ALT 0 - 44 U/L 24  13  20     Lipid Panel  No results found for: "CHOL", "TRIG", "HDL", "CHOLHDL", "VLDL", "LDLCALC", "LDLDIRECT"  CBC    Component Value Date/Time   WBC 5.3 01/12/2023 1010   WBC 5.9 08/22/2022 0345   RBC 4.31 01/12/2023 1010   HGB 12.1 01/12/2023 1010   HCT 37.4 01/12/2023 1010   PLT 184 01/12/2023 1010   MCV 86.8 01/12/2023 1010   MCH 28.1 01/12/2023 1010   MCHC 32.4 01/12/2023 1010   RDW 15.0 01/12/2023 1010   LYMPHSABS 1.7 01/12/2023 1010   MONOABS 0.3 01/12/2023 1010   EOSABS 0.1 01/12/2023 1010  BASOSABS 0.0 01/12/2023 1010    ASSESSMENT AND PLAN: 1. Type 2 diabetes mellitus with peripheral neuropathy (HCC) (Primary) Followed by endocrinology Dr. Brooks Sailors. A1c slightly above goal likely due to to recent addition of prednisone.  I recommend increasing the Lantus insulin to 14 units daily and continue the Humalog 12 units with meals with her sliding scale. - POCT glycosylated hemoglobin (Hb A1C) - POCT glucose (manual entry)  2. Hypertension associated with diabetes (HCC) Not at goal.  Prednisone may be playing a role here as well.  Continue Cozaar 100 mg daily, nifedipine 30 mg twice a day.  Add low-dose hydralazine 10 mg twice a day.  Continue to monitor blood pressure with goal being 130/80 or lower - hydrALAZINE (APRESOLINE) 10 MG tablet; Take 1 tablet (10 mg total) by mouth in the morning and at bedtime.  Dispense: 180 tablet; Refill: 1  3.  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  Currently back on prednisone with slow taper.  4. Acquired hypothyroidism Recent TSH was normal.  Continue levothyroxine 75 mcg daily.  5. Hepatocellular carcinoma (HCC) Followed by oncology.  6. History of enucleation of eye Plans to keep appointment in January for prosthetic eye fitting.  Daughter to call [facility] program about getting MMG scheduled   Patient was given the opportunity to ask questions.  Patient verbalized understanding of the plan and was able to repeat key elements of the plan.   This documentation was completed using Paediatric nurse.  Any transcriptional errors are unintentional.  Orders Placed This Encounter  Procedures   POCT glycosylated hemoglobin (Hb A1C)   POCT glucose (manual entry)     Requested Prescriptions   Signed Prescriptions Disp Refills   hydrALAZINE (APRESOLINE) 10 MG tablet 180 tablet 1    Sig: Take 1 tablet (10 mg total) by mouth in the morning and at bedtime.    Return in about 4 months (around 05/28/2023).  Jonah Blue, MD, FACP

## 2023-01-27 NOTE — Patient Instructions (Addendum)
Your blood pressure is not at goal of 130/80 or lower.  Start Hydralazine 10 mg twice a day.  Continue to monitor blood pressure.  Increase Lantus insulin to 14 units daily while on Prednisone.  Once morning blood sugars are consistently between 90-130 and/or Prednisone is stop, you can go back to 12 units daily

## 2023-01-31 ENCOUNTER — Encounter: Payer: Self-pay | Admitting: Hematology & Oncology

## 2023-01-31 ENCOUNTER — Other Ambulatory Visit: Payer: Self-pay

## 2023-01-31 MED ORDER — PREDNISONE 10 MG PO TABS
10.0000 mg | ORAL_TABLET | Freq: Every day | ORAL | 0 refills | Status: DC
  Filled 2023-01-31: qty 30, 30d supply, fill #0

## 2023-02-02 ENCOUNTER — Inpatient Hospital Stay: Payer: Self-pay

## 2023-02-02 ENCOUNTER — Encounter: Payer: Self-pay | Admitting: Hematology & Oncology

## 2023-02-02 ENCOUNTER — Other Ambulatory Visit: Payer: Self-pay

## 2023-02-02 ENCOUNTER — Ambulatory Visit: Payer: Self-pay | Admitting: Family

## 2023-02-02 ENCOUNTER — Inpatient Hospital Stay (HOSPITAL_BASED_OUTPATIENT_CLINIC_OR_DEPARTMENT_OTHER): Payer: Self-pay | Admitting: Hematology & Oncology

## 2023-02-02 ENCOUNTER — Ambulatory Visit: Payer: Self-pay

## 2023-02-02 ENCOUNTER — Inpatient Hospital Stay: Payer: Self-pay | Attending: Hematology & Oncology

## 2023-02-02 VITALS — BP 132/68 | HR 80 | Temp 98.2°F | Resp 18 | Ht 64.5 in | Wt 162.0 lb

## 2023-02-02 DIAGNOSIS — C220 Liver cell carcinoma: Secondary | ICD-10-CM

## 2023-02-02 DIAGNOSIS — Z79899 Other long term (current) drug therapy: Secondary | ICD-10-CM | POA: Insufficient documentation

## 2023-02-02 DIAGNOSIS — Z5112 Encounter for antineoplastic immunotherapy: Secondary | ICD-10-CM | POA: Insufficient documentation

## 2023-02-02 LAB — CMP (CANCER CENTER ONLY)
ALT: 22 U/L (ref 0–44)
AST: 17 U/L (ref 15–41)
Albumin: 3.6 g/dL (ref 3.5–5.0)
Alkaline Phosphatase: 65 U/L (ref 38–126)
Anion gap: 6 (ref 5–15)
BUN: 11 mg/dL (ref 8–23)
CO2: 22 mmol/L (ref 22–32)
Calcium: 9.4 mg/dL (ref 8.9–10.3)
Chloride: 110 mmol/L (ref 98–111)
Creatinine: 0.57 mg/dL (ref 0.44–1.00)
GFR, Estimated: >60 mL/min (ref >60)
Glucose, Bld: 162 mg/dL — ABNORMAL HIGH (ref 70–99)
Potassium: 3.9 mmol/L (ref 3.5–5.1)
Sodium: 138 mmol/L (ref 135–145)
Total Bilirubin: 0.7 mg/dL (ref <1.2)
Total Protein: 6 g/dL — ABNORMAL LOW (ref 6.5–8.1)

## 2023-02-02 LAB — CBC WITH DIFFERENTIAL (CANCER CENTER ONLY)
Abs Immature Granulocytes: 0.02 10*3/uL (ref 0.00–0.07)
Basophils Absolute: 0 10*3/uL (ref 0.0–0.1)
Basophils Relative: 1 %
Eosinophils Absolute: 0 10*3/uL (ref 0.0–0.5)
Eosinophils Relative: 1 %
HCT: 37.6 % (ref 36.0–46.0)
Hemoglobin: 12.3 g/dL (ref 12.0–15.0)
Immature Granulocytes: 0 %
Lymphocytes Relative: 39 %
Lymphs Abs: 2.5 10*3/uL (ref 0.7–4.0)
MCH: 27.9 pg (ref 26.0–34.0)
MCHC: 32.7 g/dL (ref 30.0–36.0)
MCV: 85.3 fL (ref 80.0–100.0)
Monocytes Absolute: 0.5 10*3/uL (ref 0.1–1.0)
Monocytes Relative: 8 %
Neutro Abs: 3.4 10*3/uL (ref 1.7–7.7)
Neutrophils Relative %: 51 %
Platelet Count: 224 10*3/uL (ref 150–400)
RBC: 4.41 MIL/uL (ref 3.87–5.11)
RDW: 14.6 % (ref 11.5–15.5)
WBC Count: 6.5 10*3/uL (ref 4.0–10.5)
nRBC: 0 % (ref 0.0–0.2)

## 2023-02-02 LAB — LACTATE DEHYDROGENASE: LDH: 175 U/L (ref 98–192)

## 2023-02-02 MED ORDER — SODIUM CHLORIDE 0.9 % IV SOLN
480.0000 mg | Freq: Once | INTRAVENOUS | Status: AC
  Administered 2023-02-02: 480 mg via INTRAVENOUS
  Filled 2023-02-02: qty 48

## 2023-02-02 MED ORDER — SODIUM CHLORIDE 0.9% FLUSH
10.0000 mL | INTRAVENOUS | Status: DC | PRN
Start: 2023-02-02 — End: 2023-02-02
  Administered 2023-02-02: 10 mL

## 2023-02-02 MED ORDER — HEPARIN SOD (PORK) LOCK FLUSH 100 UNIT/ML IV SOLN
500.0000 [IU] | Freq: Once | INTRAVENOUS | Status: AC | PRN
Start: 2023-02-02 — End: 2023-02-02
  Administered 2023-02-02: 500 [IU]

## 2023-02-02 MED ORDER — SODIUM CHLORIDE 0.9 % IV SOLN: Freq: Once | INTRAVENOUS | Status: AC

## 2023-02-02 NOTE — Patient Instructions (Signed)
 [facility] - A DEPT OF [facility]  Discharge Instructions: Thank you for choosing [facility] to provide your oncology and hematology care.   If you have a lab appointment with the Cancer Center, please go directly to the Cancer Center and check in at the registration area.  Wear comfortable clothing and clothing appropriate for easy access to any Portacath or PICC line.   We strive to give you quality time with your provider. You may need to reschedule your appointment if you arrive late (15 or more minutes).  Arriving late affects you and other patients whose appointments are after yours.  Also, if you miss three or more appointments without notifying the office, you may be dismissed from the clinic at the provider's discretion.      For prescription refill requests, have your pharmacy contact our office and allow 72 hours for refills to be completed.    Today you received the following chemotherapy and/or immunotherapy agents Nivolumab      To help prevent nausea and vomiting after your treatment, we encourage you to take your nausea medication as directed.  BELOW ARE SYMPTOMS THAT SHOULD BE REPORTED IMMEDIATELY: *FEVER GREATER THAN 100.4 F (38 C) OR HIGHER *CHILLS OR SWEATING *NAUSEA AND VOMITING THAT IS NOT CONTROLLED WITH YOUR NAUSEA MEDICATION *UNUSUAL SHORTNESS OF BREATH *UNUSUAL BRUISING OR BLEEDING *URINARY PROBLEMS (pain or burning when urinating, or frequent urination) *BOWEL PROBLEMS (unusual diarrhea, constipation, pain near the anus) TENDERNESS IN MOUTH AND THROAT WITH OR WITHOUT PRESENCE OF ULCERS (sore throat, sores in mouth, or a toothache) UNUSUAL RASH, SWELLING OR PAIN  UNUSUAL VAGINAL DISCHARGE OR ITCHING   Items with * indicate a potential emergency and should be followed up as soon as possible or go to the Emergency Department if any problems should occur.  Please show the CHEMOTHERAPY ALERT CARD or IMMUNOTHERAPY  ALERT CARD at check-in to the Emergency Department and triage nurse. Should you have questions after your visit or need to cancel or reschedule your appointment, please contact [facility] - A DEPT OF [facility]  [phone] and follow the prompts.  Office hours are 8:00 a.m. to 4:30 p.m. Monday - Friday. Please note that voicemails left after 4:00 p.m. may not be returned until the following business day.  We are closed weekends and major holidays. You have access to a nurse at all times for urgent questions. Please call the main number to the clinic [phone] and follow the prompts.  For any non-urgent questions, you may also contact your provider using MyChart. We now offer e-Visits for anyone 14 and older to request care online for non-urgent symptoms. For details visit [email or web address].   Also download the MyChart app! Go to the app store, search "MyChart", open the app, select [place], and log in with your MyChart username and password.

## 2023-02-02 NOTE — Progress Notes (Signed)
Hematology and Oncology Follow Up Visit  [name] [name] [id number] 1945-12-14 77 y.o. 02/02/2023   Principle Diagnosis:  Hepatocellular carcinoma-multifocal Hepatitis C   Current Therapy:        Status post cycle 1 of nivolumab/ipilimumab -- d/c on 10/13/2020 due to hepatic toxicity Nivolumab 480 mg IV every 4 weeks - s/p cycle #22 -- started 11/21/2020 --changed to 480 mg on 08/2021 Nexavar 200 mg po BID -- to be taken while in [place] -- start on 02/26/2022 -- d/c on 06/16/2022   Interim History:  [name] is here today for follow-up.  She actually is doing a bit better.  The skin lesions on her leg are improving.  She was seen by her dermatologist.  She had a some injection of the of cortisone to these lesions.  They seem to be drying up..  She is on prednisone.  Now that she is on 10 mg a day.  Her last alpha-fetoprotein was down to 4.8.  She really has done nicely.  She is not can have the right eye prosthetic until January.  She has had no problems with nausea or vomiting.  She has had no cough or shortness of breath.  Her blood sugars have been on the higher side.  Overall, I would say that her performance status is probably ECOG 1.    Medications:  Allergies as of 02/02/2023       Reactions   Voriconazole Shortness Of Breath, Swelling   Patient reported that tablets sent her to the ER   Bactrim [sulfamethoxazole-trimethoprim] Swelling, Other (See Comments)   Site of swelling not recalled   Clarithromycin Swelling, Rash, Other (See Comments)   Daughter is unsure if Clarithromycin or Amoxicillin caused rash and swelling. Patient states no allergy to amoxicillan - has taken this recently with no issues. (12/02/21)        Medication List        Accurate as of February 02, 2023  8:26 AM. If you have any questions, ask your nurse or doctor.          bacitracin-polymyxin b ophthalmic ointment Commonly known as:  POLYSPORIN Apply 0.5 inch ribbon to affected eye(s) 4 (four) times a day.   bacitracin-polymyxin b ophthalmic ointment Commonly known as: POLYSPORIN Apply 1 Application to eye.   Basaglar KwikPen 100 UNIT/ML Inject 12 Units into the skin in the morning.   Basaglar KwikPen 100 UNIT/ML Inject 12 Units into the skin daily.   brimonidine 0.15 % ophthalmic solution Commonly known as: ALPHAGAN Place into the left eye 2 (two) times daily.   diphenhydramine-acetaminophen 25-500 MG Tabs tablet Commonly known as: TYLENOL PM Take by mouth.   docusate sodium 100 MG capsule Commonly known as: Colace Take 1 capsule (100 mg total) by mouth daily.   doxycycline 100 MG tablet Commonly known as: VIBRA-TABS Take 1 tablet (100 mg total) by mouth 2 (two) times daily.   EPCLUSA PO   erythromycin ophthalmic ointment Place  0.5 inch ribbon to the right eye 4 (four) times daily.   gabapentin 300 MG capsule Commonly known as: NEURONTIN Take 1 capsule (300 mg total) by mouth 3 (three) times daily.   HumaLOG KwikPen 200 UNIT/ML KwikPen Generic drug: insulin lispro Max daily dose 100 units. What changed:  how much to take how to take this when to take this additional instructions   hydrALAZINE 10 MG tablet Commonly known as: APRESOLINE Take 1 tablet (10 mg total) by mouth in the morning  and at bedtime.   levothyroxine 75 MCG tablet Commonly known as: Synthroid Take 1 tablet (75 mcg total) by mouth daily before breakfast.   lidocaine-prilocaine cream Commonly known as: EMLA Apply 1 Application topically as needed (for port access).   LINZESS PO   losartan 100 MG tablet Commonly known as: COZAAR Take 1 tablet (100 mg total) by mouth at bedtime.   methylPREDNISolone 4 MG Tbpk tablet Commonly known as: Medrol Start Saturday morning, use as directed. Measure blood sugars   NIFEdipine 30 MG 24 hr tablet Commonly known as: PROCARDIA-XL/NIFEDICAL-XL Take 1 tablet (30 mg total) by  mouth in the morning and at bedtime.   ondansetron 4 MG disintegrating tablet Commonly known as: ZOFRAN-ODT Take 1 tablet (4 mg total) by mouth every 8 (eight) hours as needed for post operative nausea   Opdivo 100 MG/10ML Soln chemo injection Generic drug: nivolumab Inject into the vein.   pantoprazole 40 MG tablet Commonly known as: Protonix Take 1 tablet (40 mg total) by mouth 2 (two) times daily.   polyethylene glycol powder 17 GM/SCOOP powder Commonly known as: GLYCOLAX/MIRALAX Take 17 g by mouth daily as needed for mild constipation.   predniSONE 20 MG tablet Commonly known as: DELTASONE Take 2 tablets (40 mg total) by mouth daily with breakfast.   predniSONE 20 MG tablet Commonly known as: DELTASONE Take 1.5 tablets (30 mg total) by mouth daily for 5 days, THEN 1 tablet (20 mg total) daily for 5 days, THEN 0.5 tablets (10 mg total) daily for 5 days. Start taking on: January 19, 2023   predniSONE 10 MG tablet Commonly known as: DELTASONE Take one tablet by mouth daily   Refresh Tears 0.5 % Soln Generic drug: carboxymethylcellulose Place 1 drop into the right eye every 2 (two) hours.   triamcinolone acetonide 10 MG/ML injection Commonly known as: KENALOG by Intra-Lesional route.   True Metrix Blood Glucose Test test strip Generic drug: glucose blood Use as instructed   TRUEplus 5-Bevel Pen Needles 32G X 4 MM Misc Generic drug: Insulin Pen Needle Use with insulins daily.   Vitamin D3 10 MCG (400 UNIT) tablet Take by mouth.        Allergies:  Allergies  Allergen Reactions   Voriconazole Shortness Of Breath and Swelling    Patient reported that tablets sent her to the ER   Bactrim [Sulfamethoxazole-Trimethoprim] Swelling and Other (See Comments)    Site of swelling not recalled   Clarithromycin Swelling, Rash and Other (See Comments)    Daughter is unsure if Clarithromycin or Amoxicillin caused rash and swelling.  Patient states no allergy to  amoxicillan - has taken this recently with no issues. (12/02/21)    Past Medical History, Surgical history, Social history, and Family History were reviewed and updated.  Review of Systems: Review of Systems  Constitutional: Negative.   HENT: Negative.    Eyes: Negative.   Respiratory: Negative.    Cardiovascular: Negative.   Gastrointestinal: Negative.   Genitourinary: Negative.   Musculoskeletal: Negative.   Skin: Negative.   Neurological:  Positive for tingling.  Endo/Heme/Allergies: Negative.   Psychiatric/Behavioral: Negative.       Physical Exam: Vital signs with temperature of 98.2.  Pulse 80.  Blood pressure 132/68.  Weight is 162 pounds.  Wt Readings from Last 3 Encounters:  02/02/23 162 lb (73.5 kg)  01/27/23 165 lb (74.8 kg)  01/12/23 163 lb (73.9 kg)    Physical Exam Vitals reviewed.  HENT:     Head: Normocephalic  and atraumatic.  Eyes:     Pupils: Pupils are equal, round, and reactive to light.     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     Cardiovascular:     Rate and Rhythm: Normal rate and regular rhythm.     Heart sounds: Normal heart sounds.  Pulmonary:     Effort: Pulmonary effort is normal.     Breath sounds: Normal breath sounds.  Abdominal:     General: Bowel sounds are normal.     Palpations: Abdomen is soft.     Comments: Abdominal exam shows a well-healed laparotomy scar.  She has no fluid wave.  There is no guarding or rebound tenderness to palpation.  There is no palpable liver or spleen tip.  Musculoskeletal:        General: No tenderness or deformity. Normal range of motion.     Cervical back: Normal range of motion.  Lymphadenopathy:     Cervical: No cervical adenopathy.  Skin:    General: Skin is warm and dry.     Findings: No erythema or rash.     Comments: Skin exam does show a  papular rash on her lower legs.  This is above and below the knees.  These lesions are  somewhat firm.  They are raised.  They are none tender.    Neurological:     Mental Status: She is alert and oriented to person, place, and time.  Psychiatric:        Behavior: Behavior normal.        Thought Content: Thought content normal.        Judgment: Judgment normal.       Lab Results  Component Value Date   WBC 5.3 01/12/2023   HGB 12.1 01/12/2023   HCT 37.4 01/12/2023   MCV 86.8 01/12/2023   PLT 184 01/12/2023   Lab Results  Component Value Date   FERRITIN 603 (H) 10/02/2020   IRON 31 (L) 10/02/2020   TIBC 241 10/02/2020   UIBC 211 10/02/2020   IRONPCTSAT 13 (L) 10/02/2020   Lab Results  Component Value Date   RBC 4.31 01/12/2023   No results found for: "KPAFRELGTCHN", "LAMBDASER", "KAPLAMBRATIO" No results found for: "IGGSERUM", "IGA", "IGMSERUM" No results found for: "TOTALPROTELP", "ALBUMINELP", "A1GS", "A2GS", "BETS", "BETA2SER", "GAMS", "MSPIKE", "SPEI"   Chemistry      Component Value Date/Time   NA 140 01/12/2023 1010   NA 140 01/22/2022 0849   K 3.6 01/12/2023 1010   CL 109 01/12/2023 1010   CO2 23 01/12/2023 1010   BUN 19 01/12/2023 1010   BUN 11 01/22/2022 0849   CREATININE 0.60 01/12/2023 1010   CREATININE 0.58 (L) 08/20/2021 1541      Component Value Date/Time   CALCIUM 10.4 (H) 01/12/2023 1010   ALKPHOS 98 01/12/2023 1010   AST 21 01/12/2023 1010   ALT 24 01/12/2023 1010   BILITOT 0.5 01/12/2023 1010       Impression and Plan: Ms. Lazaro Arms is a very pleasant 77 yo female from [place] with hepatocellular carcinoma and Hepatitis C.  She actually has had treatment for the Hepatitis C.  I am sure that this is part of the reason why she has done so well with response.  Again, the hepatocellular carcinoma really is doing quite nicely.  The skin lesions seem to be improving.  I am sure that the steroids are helping.  I really do  not see a problem with get her back on the nivolumab.  This really has helped with her  malignancy.  Hopefully, her blood sugars will be as much of a problem with her coming down on the prednisone.  I am very happy that she has done so well.  We will plan to get her back in another month.   [name], MD 12/18/20248:26 AM

## 2023-02-03 LAB — AFP TUMOR MARKER: AFP, Serum, Tumor Marker: 4.8 ng/mL (ref 0.0–9.2)

## 2023-02-07 ENCOUNTER — Encounter: Payer: Self-pay | Admitting: Hematology & Oncology

## 2023-02-07 ENCOUNTER — Other Ambulatory Visit: Payer: Self-pay

## 2023-02-07 ENCOUNTER — Other Ambulatory Visit: Payer: Self-pay | Admitting: Internal Medicine

## 2023-02-07 MED ORDER — PANTOPRAZOLE SODIUM 40 MG PO TBEC
40.0000 mg | DELAYED_RELEASE_TABLET | Freq: Two times a day (BID) | ORAL | 0 refills | Status: DC
  Filled 2023-02-07: qty 240, 120d supply, fill #0

## 2023-02-10 ENCOUNTER — Telehealth: Payer: Self-pay

## 2023-02-10 ENCOUNTER — Other Ambulatory Visit: Payer: Self-pay

## 2023-02-10 ENCOUNTER — Encounter: Payer: Self-pay | Admitting: Hematology & Oncology

## 2023-02-10 MED ORDER — INSULIN GLARGINE 100 UNIT/ML SOLOSTAR PEN
12.0000 [IU] | PEN_INJECTOR | Freq: Every day | SUBCUTANEOUS | 6 refills | Status: DC
  Filled 2023-02-10: qty 3, 25d supply, fill #0
  Filled 2023-02-28: qty 3, 25d supply, fill #1

## 2023-02-10 NOTE — Telephone Encounter (Signed)
Last OV and A1C in December.  PCP did not prescribe. Will route to advise on Lantus.

## 2023-02-10 NOTE — Telephone Encounter (Signed)
Lantus refill request complete

## 2023-02-10 NOTE — Telephone Encounter (Signed)
Refill was sent on Lantus by her endocrinologist today.  MyChart message sent to patient.

## 2023-02-10 NOTE — Telephone Encounter (Signed)
Patients daughter came in requesting refills on insulin Lantus

## 2023-02-11 ENCOUNTER — Other Ambulatory Visit: Payer: Self-pay

## 2023-02-23 ENCOUNTER — Encounter: Payer: Self-pay | Admitting: Hematology & Oncology

## 2023-02-28 ENCOUNTER — Other Ambulatory Visit: Payer: Self-pay

## 2023-02-28 ENCOUNTER — Encounter: Payer: Self-pay | Admitting: Hematology & Oncology

## 2023-02-28 ENCOUNTER — Other Ambulatory Visit: Payer: Self-pay | Admitting: Internal Medicine

## 2023-02-28 MED ORDER — PANTOPRAZOLE SODIUM 40 MG PO TBEC
40.0000 mg | DELAYED_RELEASE_TABLET | Freq: Two times a day (BID) | ORAL | 1 refills | Status: DC
  Filled 2023-02-28 – 2023-06-15 (×2): qty 180, 90d supply, fill #0
  Filled 2023-09-21: qty 180, 90d supply, fill #1

## 2023-03-01 ENCOUNTER — Other Ambulatory Visit: Payer: Self-pay

## 2023-03-02 ENCOUNTER — Encounter: Payer: Self-pay | Admitting: Hematology & Oncology

## 2023-03-02 ENCOUNTER — Inpatient Hospital Stay: Payer: Self-pay

## 2023-03-02 ENCOUNTER — Encounter: Payer: Self-pay | Admitting: Internal Medicine

## 2023-03-02 ENCOUNTER — Inpatient Hospital Stay (HOSPITAL_BASED_OUTPATIENT_CLINIC_OR_DEPARTMENT_OTHER): Payer: Self-pay | Admitting: Hematology & Oncology

## 2023-03-02 ENCOUNTER — Inpatient Hospital Stay: Payer: Self-pay | Attending: Hematology & Oncology

## 2023-03-02 ENCOUNTER — Other Ambulatory Visit: Payer: Self-pay

## 2023-03-02 VITALS — BP 130/64 | HR 97 | Temp 97.8°F | Resp 17 | Ht 64.5 in | Wt 167.4 lb

## 2023-03-02 VITALS — BP 132/45

## 2023-03-02 DIAGNOSIS — Z79890 Hormone replacement therapy (postmenopausal): Secondary | ICD-10-CM | POA: Insufficient documentation

## 2023-03-02 DIAGNOSIS — C220 Liver cell carcinoma: Secondary | ICD-10-CM

## 2023-03-02 DIAGNOSIS — Z79899 Other long term (current) drug therapy: Secondary | ICD-10-CM | POA: Insufficient documentation

## 2023-03-02 DIAGNOSIS — Z7952 Long term (current) use of systemic steroids: Secondary | ICD-10-CM | POA: Insufficient documentation

## 2023-03-02 DIAGNOSIS — Z5112 Encounter for antineoplastic immunotherapy: Secondary | ICD-10-CM | POA: Insufficient documentation

## 2023-03-02 DIAGNOSIS — Z79620 Long term (current) use of immunosuppressive biologic: Secondary | ICD-10-CM | POA: Insufficient documentation

## 2023-03-02 DIAGNOSIS — B1920 Unspecified viral hepatitis C without hepatic coma: Secondary | ICD-10-CM | POA: Insufficient documentation

## 2023-03-02 DIAGNOSIS — Z883 Allergy status to other anti-infective agents status: Secondary | ICD-10-CM | POA: Insufficient documentation

## 2023-03-02 DIAGNOSIS — Z881 Allergy status to other antibiotic agents status: Secondary | ICD-10-CM | POA: Insufficient documentation

## 2023-03-02 LAB — CMP (CANCER CENTER ONLY)
ALT: 23 U/L (ref 0–44)
AST: 13 U/L — ABNORMAL LOW (ref 15–41)
Albumin: 3.9 g/dL (ref 3.5–5.0)
Alkaline Phosphatase: 52 U/L (ref 38–126)
Anion gap: 6 (ref 5–15)
BUN: 18 mg/dL (ref 8–23)
CO2: 27 mmol/L (ref 22–32)
Calcium: 11.3 mg/dL — ABNORMAL HIGH (ref 8.9–10.3)
Chloride: 106 mmol/L (ref 98–111)
Creatinine: 0.75 mg/dL (ref 0.44–1.00)
GFR, Estimated: >60 mL/min (ref >60)
Glucose, Bld: 194 mg/dL — ABNORMAL HIGH (ref 70–99)
Potassium: 3.8 mmol/L (ref 3.5–5.1)
Sodium: 139 mmol/L (ref 135–145)
Total Bilirubin: 0.6 mg/dL (ref 0.0–1.2)
Total Protein: 6.6 g/dL (ref 6.5–8.1)

## 2023-03-02 LAB — CBC WITH DIFFERENTIAL (CANCER CENTER ONLY)
Abs Immature Granulocytes: 0.04 10*3/uL (ref 0.00–0.07)
Basophils Absolute: 0 10*3/uL (ref 0.0–0.1)
Basophils Relative: 0 %
Eosinophils Absolute: 0 10*3/uL (ref 0.0–0.5)
Eosinophils Relative: 1 %
HCT: 38.4 % (ref 36.0–46.0)
Hemoglobin: 12.6 g/dL (ref 12.0–15.0)
Immature Granulocytes: 1 %
Lymphocytes Relative: 29 %
Lymphs Abs: 2.4 10*3/uL (ref 0.7–4.0)
MCH: 27.5 pg (ref 26.0–34.0)
MCHC: 32.8 g/dL (ref 30.0–36.0)
MCV: 83.7 fL (ref 80.0–100.0)
Monocytes Absolute: 0.4 10*3/uL (ref 0.1–1.0)
Monocytes Relative: 5 %
Neutro Abs: 5.4 10*3/uL (ref 1.7–7.7)
Neutrophils Relative %: 64 %
Platelet Count: 192 10*3/uL (ref 150–400)
RBC: 4.59 MIL/uL (ref 3.87–5.11)
RDW: 15.5 % (ref 11.5–15.5)
WBC Count: 8.4 10*3/uL (ref 4.0–10.5)
nRBC: 0 % (ref 0.0–0.2)

## 2023-03-02 LAB — LACTATE DEHYDROGENASE: LDH: 211 U/L — ABNORMAL HIGH (ref 98–192)

## 2023-03-02 MED ORDER — SODIUM CHLORIDE 0.9 % IV SOLN: Freq: Once | INTRAVENOUS | Status: AC

## 2023-03-02 MED ORDER — INSULIN GLARGINE 100 UNIT/ML SOLOSTAR PEN
14.0000 [IU] | PEN_INJECTOR | Freq: Every day | SUBCUTANEOUS | 6 refills | Status: DC
  Filled 2023-03-02: qty 6, 42d supply, fill #0
  Filled 2023-04-15 (×3): qty 6, 42d supply, fill #1
  Filled 2023-05-20 (×2): qty 6, 42d supply, fill #2

## 2023-03-02 MED ORDER — HEPARIN SOD (PORK) LOCK FLUSH 100 UNIT/ML IV SOLN
500.0000 [IU] | Freq: Once | INTRAVENOUS | Status: AC | PRN
  Administered 2023-03-02: 500 [IU]

## 2023-03-02 MED ORDER — SODIUM CHLORIDE 0.9% FLUSH
10.0000 mL | INTRAVENOUS | Status: DC | PRN
  Administered 2023-03-02: 10 mL

## 2023-03-02 MED ORDER — NIVOLUMAB CHEMO INJECTION 100 MG/10ML
480.0000 mg | Freq: Once | INTRAVENOUS | Status: AC
  Administered 2023-03-02: 480 mg via INTRAVENOUS
  Filled 2023-03-02: qty 48

## 2023-03-02 NOTE — Patient Instructions (Signed)
 [facility] - A DEPT OF [facility]  Discharge Instructions: Thank you for choosing [facility] to provide your oncology and hematology care.   If you have a lab appointment with the Cancer Center, please go directly to the Cancer Center and check in at the registration area.  Wear comfortable clothing and clothing appropriate for easy access to any Portacath or PICC line.   We strive to give you quality time with your provider. You may need to reschedule your appointment if you arrive late (15 or more minutes).  Arriving late affects you and other patients whose appointments are after yours.  Also, if you miss three or more appointments without notifying the office, you may be dismissed from the clinic at the provider's discretion.      For prescription refill requests, have your pharmacy contact our office and allow 72 hours for refills to be completed.    Today you received the following chemotherapy and/or immunotherapy agents Nivolumab    To help prevent nausea and vomiting after your treatment, we encourage you to take your nausea medication as directed.  BELOW ARE SYMPTOMS THAT SHOULD BE REPORTED IMMEDIATELY: *FEVER GREATER THAN 100.4 F (38 C) OR HIGHER *CHILLS OR SWEATING *NAUSEA AND VOMITING THAT IS NOT CONTROLLED WITH YOUR NAUSEA MEDICATION *UNUSUAL SHORTNESS OF BREATH *UNUSUAL BRUISING OR BLEEDING *URINARY PROBLEMS (pain or burning when urinating, or frequent urination) *BOWEL PROBLEMS (unusual diarrhea, constipation, pain near the anus) TENDERNESS IN MOUTH AND THROAT WITH OR WITHOUT PRESENCE OF ULCERS (sore throat, sores in mouth, or a toothache) UNUSUAL RASH, SWELLING OR PAIN  UNUSUAL VAGINAL DISCHARGE OR ITCHING   Items with * indicate a potential emergency and should be followed up as soon as possible or go to the Emergency Department if any problems should occur.  Please show the CHEMOTHERAPY ALERT CARD or IMMUNOTHERAPY  ALERT CARD at check-in to the Emergency Department and triage nurse. Should you have questions after your visit or need to cancel or reschedule your appointment, please contact [facility] - A DEPT OF [facility]  [phone] and follow the prompts.  Office hours are 8:00 a.m. to 4:30 p.m. Monday - Friday. Please note that voicemails left after 4:00 p.m. may not be returned until the following business day.  We are closed weekends and major holidays. You have access to a nurse at all times for urgent questions. Please call the main number to the clinic [phone] and follow the prompts.  For any non-urgent questions, you may also contact your provider using MyChart. We now offer e-Visits for anyone 14 and older to request care online for non-urgent symptoms. For details visit [email or web address].   Also download the MyChart app! Go to the app store, search "MyChart", open the app, select [place], and log in with your MyChart username and password.

## 2023-03-02 NOTE — Progress Notes (Signed)
 Hematology and Oncology Follow Up Visit  [name] [name] [id number] 08/24/1945 78 y.o. 03/02/2023   Principle Diagnosis:  Hepatocellular carcinoma-multifocal Hepatitis C   Current Therapy:        Status post cycle 1 of nivolumab /ipilimumab  -- d/c on 10/13/2020 due to hepatic toxicity Nivolumab  480 mg IV every 4 weeks - s/p cycle #22 -- started 11/21/2020 --changed to 480 mg on 08/2021 Nexavar  200 mg po BID -- to be taken while in [place] -- start on 02/26/2022 -- d/c on 06/16/2022   Interim History:  [name] [name] is here today for follow-up.  She had nice Christmas and New Year's.  I am happy for her.  Her right eye prosthesis has been put in.  It may need to be adjusted a little bit.  She has not complained of any pain.  She has had no fever.  There has been no cough or shortness of breath.  Her last alpha-fetoprotein was 4.8.  Her blood sugars are on the high side because of prednisone .  She is on low-dose prednisone  to help with her skin.  I think she sees a dermatologist tomorrow.  She has had no change in bowel or bladder habits.  She has had no bleeding.  Overall, I would say performance status is probably ECOG 1.    Medications:  Allergies as of 03/02/2023       Reactions   Voriconazole  Shortness Of Breath, Swelling   Patient reported that tablets sent her to the ER   Bactrim [sulfamethoxazole-trimethoprim] Swelling, Other (See Comments)   Site of swelling not recalled   Clarithromycin  Swelling, Rash, Other (See Comments)   Daughter is unsure if Clarithromycin  or Amoxicillin  caused rash and swelling. Patient states no allergy to amoxicillan - has taken this recently with no issues. (12/02/21)        Medication List        Accurate as of March 02, 2023  8:37 AM. If you have any questions, ask your nurse or doctor.          STOP taking these medications    bacitracin -polymyxin b  ophthalmic ointment Commonly known as:  POLYSPORIN  Stopped by: Mikle Sternberg R Johnnette Laux   docusate sodium  100 MG capsule Commonly known as: Colace Stopped by: Rhetta Cleek R Luverne Zerkle   doxycycline  100 MG tablet Commonly known as: VIBRA -TABS Stopped by: Ivor Mars   EPCLUSA  PO Stopped by: Ivor Mars   erythromycin  ophthalmic ointment Stopped by: Ivor Mars   LINZESS  PO Stopped by: Ivor Mars   methylPREDNISolone  4 MG Tbpk tablet Commonly known as: Medrol  Stopped by: Ivor Mars   ondansetron  4 MG disintegrating tablet Commonly known as: ZOFRAN -ODT Stopped by: Kalik Hoare R Zuria Fosdick   Refresh Tears 0.5 % Soln Generic drug: carboxymethylcellulose Stopped by: Ivor Mars   triamcinolone  acetonide 10 MG/ML injection Commonly known as: KENALOG  Stopped by: Patrice Moates R Adama Ferber   Vitamin D3 10 MCG (400 UNIT) tablet Stopped by: Ivor Mars       TAKE these medications    Basaglar  KwikPen 100 UNIT/ML Inject 12 Units into the skin daily.   brimonidine  0.15 % ophthalmic solution Commonly known as: ALPHAGAN  Place into the left eye 2 (two) times daily.   diphenhydramine -acetaminophen  25-500 MG Tabs tablet Commonly known as: TYLENOL  PM Take by mouth.   gabapentin  300 MG capsule Commonly known as: NEURONTIN  Take 1 capsule (300 mg total) by mouth 3 (three) times daily.   HumaLOG  KwikPen 200 UNIT/ML KwikPen Generic  drug: insulin  lispro Max daily dose 100 units. What changed:  how much to take how to take this when to take this additional instructions   hydrALAZINE  10 MG tablet Commonly known as: APRESOLINE  Take 1 tablet (10 mg total) by mouth in the morning and at bedtime.   levothyroxine  75 MCG tablet Commonly known as: Synthroid  Take 1 tablet (75 mcg total) by mouth daily before breakfast.   lidocaine -prilocaine  cream Commonly known as: EMLA  Apply 1 Application topically as needed (for port access).   losartan  100 MG tablet Commonly known as: COZAAR  Take 1 tablet (100 mg total) by mouth  at bedtime.   NIFEdipine  30 MG 24 hr tablet Commonly known as: PROCARDIA -XL/NIFEDICAL-XL Take 1 tablet (30 mg total) by mouth in the morning and at bedtime.   Opdivo  100 MG/10ML Soln chemo injection Generic drug: nivolumab  Inject into the vein.   pantoprazole  40 MG tablet Commonly known as: Protonix  Take 1 tablet (40 mg total) by mouth 2 (two) times daily.   polyethylene glycol powder 17 GM/SCOOP powder Commonly known as: GLYCOLAX /MIRALAX  Take 17 g by mouth daily as needed for mild constipation.   predniSONE  20 MG tablet Commonly known as: DELTASONE  Take 2 tablets (40 mg total) by mouth daily with breakfast. What changed: how much to take   predniSONE  10 MG tablet Commonly known as: DELTASONE  Take one tablet by mouth daily What changed: Another medication with the same name was changed. Make sure you understand how and when to take each.   True Metrix Blood Glucose Test test strip Generic drug: glucose blood Use as instructed   TRUEplus 5-Bevel Pen Needles 32G X 4 MM Misc Generic drug: Insulin  Pen Needle Use with insulins daily.        Allergies:  Allergies  Allergen Reactions   Voriconazole  Shortness Of Breath and Swelling    Patient reported that tablets sent her to the ER   Bactrim [Sulfamethoxazole-Trimethoprim] Swelling and Other (See Comments)    Site of swelling not recalled   Clarithromycin  Swelling, Rash and Other (See Comments)    Daughter is unsure if Clarithromycin  or Amoxicillin  caused rash and swelling.  Patient states no allergy to amoxicillan - has taken this recently with no issues. (12/02/21)    Past Medical History, Surgical history, Social history, and Family History were reviewed and updated.  Review of Systems: Review of Systems  Constitutional: Negative.   HENT: Negative.    Eyes: Negative.   Respiratory: Negative.    Cardiovascular: Negative.   Gastrointestinal: Negative.   Genitourinary: Negative.   Musculoskeletal: Negative.    Skin: Negative.   Neurological:  Positive for tingling.  Endo/Heme/Allergies: Negative.   Psychiatric/Behavioral: Negative.       Physical Exam: Vital signs with temperature of 97.8.  Pulse 97.  Blood pressure 130/64.  Weight is 167 pounds.   Wt Readings from Last 3 Encounters:  03/02/23 167 lb 6.4 oz (75.9 kg)  02/02/23 162 lb (73.5 kg)  01/27/23 165 lb (74.8 kg)    Physical Exam Vitals reviewed.  HENT:     Head: Normocephalic and atraumatic.  Eyes:     Pupils: Pupils are equal, round, and reactive to light.     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     Cardiovascular:     Rate and Rhythm: Normal rate and regular rhythm.     Heart sounds: Normal heart sounds.  Pulmonary:     Effort: Pulmonary effort is  normal.     Breath sounds: Normal breath sounds.  Abdominal:     General: Bowel sounds are normal.     Palpations: Abdomen is soft.     Comments: Abdominal exam shows a well-healed laparotomy scar.  She has no fluid wave.  There is no guarding or rebound tenderness to palpation.  There is no palpable liver or spleen tip.  Musculoskeletal:        General: No tenderness or deformity. Normal range of motion.     Cervical back: Normal range of motion.  Lymphadenopathy:     Cervical: No cervical adenopathy.  Skin:    General: Skin is warm and dry.     Findings: No erythema or rash.     Comments: Skin exam does show a  papular rash on her lower legs.  This is above and below the knees.  These lesions are somewhat firm.  They are raised.  They are none tender.    Neurological:     Mental Status: She is alert and oriented to person, place, and time.  Psychiatric:        Behavior: Behavior normal.        Thought Content: Thought content normal.        Judgment: Judgment normal.       Lab Results  Component Value Date   WBC 8.4 03/02/2023   HGB 12.6 03/02/2023   HCT 38.4 03/02/2023   MCV 83.7 03/02/2023   PLT 192  03/02/2023   Lab Results  Component Value Date   FERRITIN 603 (H) 10/02/2020   IRON 31 (L) 10/02/2020   TIBC 241 10/02/2020   UIBC 211 10/02/2020   IRONPCTSAT 13 (L) 10/02/2020   Lab Results  Component Value Date   RBC 4.59 03/02/2023   No results found for: "KPAFRELGTCHN", "LAMBDASER", "KAPLAMBRATIO" No results found for: "IGGSERUM", "IGA", "IGMSERUM" No results found for: "TOTALPROTELP", "ALBUMINELP", "A1GS", "A2GS", "BETS", "BETA2SER", "GAMS", "MSPIKE", "SPEI"   Chemistry      Component Value Date/Time   NA 138 02/02/2023 0755   NA 140 01/22/2022 0849   K 3.9 02/02/2023 0755   CL 110 02/02/2023 0755   CO2 22 02/02/2023 0755   BUN 11 02/02/2023 0755   BUN 11 01/22/2022 0849   CREATININE 0.57 02/02/2023 0755   CREATININE 0.58 (L) 08/20/2021 1541      Component Value Date/Time   CALCIUM 9.4 02/02/2023 0755   ALKPHOS 65 02/02/2023 0755   AST 17 02/02/2023 0755   ALT 22 02/02/2023 0755   BILITOT 0.7 02/02/2023 0755       Impression and Plan: [name] [name] is a very pleasant 78 yo female from [place] with hepatocellular carcinoma and Hepatitis C.  She actually has had treatment for the Hepatitis C.  I am sure that this is part of the reason why she has done so well with response.  Again, the hepatocellular carcinoma really is doing quite nicely.  The skin lesions seem to be improving.  I am sure that the steroids are helping.  I really do not see a problem with get her back on the nivolumab .  This really has helped with her malignancy.  I noted that her blood calcium was on the higher side.  For right now, we will just watch this.  I am not too concerned about this.  Hopefully, her blood sugars will be as much of a problem with her coming down on the prednisone .  I am very happy that she has  done so well.  We will plan to get her back in another month.   Ivor Mars, MD 1/15/20258:37 AM

## 2023-03-03 ENCOUNTER — Other Ambulatory Visit: Payer: Self-pay

## 2023-03-03 ENCOUNTER — Encounter: Payer: Self-pay | Admitting: Hematology & Oncology

## 2023-03-03 LAB — AFP TUMOR MARKER: AFP, Serum, Tumor Marker: 5.1 ng/mL (ref 0.0–9.2)

## 2023-03-03 MED ORDER — CLOBETASOL PROPIONATE 0.05 % EX OINT
TOPICAL_OINTMENT | Freq: Two times a day (BID) | CUTANEOUS | 5 refills | Status: DC
  Filled 2023-03-03: qty 60, 30d supply, fill #0
  Filled 2023-03-29: qty 60, 30d supply, fill #1

## 2023-03-03 MED ORDER — PREDNISONE 10 MG PO TABS
10.0000 mg | ORAL_TABLET | Freq: Every morning | ORAL | 1 refills | Status: DC
  Filled 2023-03-03: qty 45, 45d supply, fill #0
  Filled 2023-04-15 (×2): qty 45, 45d supply, fill #1

## 2023-03-04 ENCOUNTER — Other Ambulatory Visit: Payer: Self-pay

## 2023-03-17 ENCOUNTER — Other Ambulatory Visit: Payer: Self-pay | Admitting: Hematology & Oncology

## 2023-03-17 DIAGNOSIS — C220 Liver cell carcinoma: Secondary | ICD-10-CM

## 2023-03-18 ENCOUNTER — Encounter: Payer: Self-pay | Admitting: Hematology & Oncology

## 2023-03-18 ENCOUNTER — Other Ambulatory Visit: Payer: Self-pay

## 2023-03-18 MED ORDER — LEVOTHYROXINE SODIUM 75 MCG PO TABS
75.0000 ug | ORAL_TABLET | Freq: Every day | ORAL | 3 refills | Status: DC
  Filled 2023-03-18: qty 30, 30d supply, fill #0
  Filled 2023-04-15 (×2): qty 30, 30d supply, fill #1
  Filled 2023-05-20: qty 30, 30d supply, fill #2
  Filled 2023-06-22: qty 30, 30d supply, fill #3

## 2023-03-23 ENCOUNTER — Other Ambulatory Visit: Payer: Self-pay

## 2023-03-29 ENCOUNTER — Other Ambulatory Visit: Payer: Self-pay

## 2023-03-29 ENCOUNTER — Encounter: Payer: Self-pay | Admitting: Hematology & Oncology

## 2023-03-29 MED ORDER — BRIMONIDINE TARTRATE 0.15 % OP SOLN
1.0000 [drp] | Freq: Two times a day (BID) | OPHTHALMIC | 11 refills | Status: AC
  Filled 2023-03-29: qty 15, 150d supply, fill #0

## 2023-03-30 ENCOUNTER — Encounter: Payer: Self-pay | Admitting: Hematology & Oncology

## 2023-03-30 ENCOUNTER — Inpatient Hospital Stay: Payer: Self-pay

## 2023-03-30 ENCOUNTER — Inpatient Hospital Stay (HOSPITAL_BASED_OUTPATIENT_CLINIC_OR_DEPARTMENT_OTHER): Payer: Self-pay | Admitting: Hematology & Oncology

## 2023-03-30 ENCOUNTER — Inpatient Hospital Stay: Payer: Self-pay | Attending: Hematology & Oncology

## 2023-03-30 ENCOUNTER — Other Ambulatory Visit: Payer: Self-pay

## 2023-03-30 VITALS — BP 147/76 | HR 86 | Temp 98.1°F | Resp 17 | Ht 65.0 in | Wt 170.0 lb

## 2023-03-30 VITALS — BP 138/60 | HR 96 | Resp 17

## 2023-03-30 DIAGNOSIS — Z5112 Encounter for antineoplastic immunotherapy: Secondary | ICD-10-CM | POA: Insufficient documentation

## 2023-03-30 DIAGNOSIS — Z794 Long term (current) use of insulin: Secondary | ICD-10-CM | POA: Insufficient documentation

## 2023-03-30 DIAGNOSIS — C220 Liver cell carcinoma: Secondary | ICD-10-CM | POA: Insufficient documentation

## 2023-03-30 DIAGNOSIS — R21 Rash and other nonspecific skin eruption: Secondary | ICD-10-CM | POA: Insufficient documentation

## 2023-03-30 DIAGNOSIS — E119 Type 2 diabetes mellitus without complications: Secondary | ICD-10-CM | POA: Insufficient documentation

## 2023-03-30 LAB — CBC WITH DIFFERENTIAL (CANCER CENTER ONLY)
Abs Immature Granulocytes: 0.02 10*3/uL (ref 0.00–0.07)
Basophils Absolute: 0 10*3/uL (ref 0.0–0.1)
Basophils Relative: 0 %
Eosinophils Absolute: 0 10*3/uL (ref 0.0–0.5)
Eosinophils Relative: 1 %
HCT: 38.8 % (ref 36.0–46.0)
Hemoglobin: 12.6 g/dL (ref 12.0–15.0)
Immature Granulocytes: 0 %
Lymphocytes Relative: 37 %
Lymphs Abs: 2.4 10*3/uL (ref 0.7–4.0)
MCH: 26.6 pg (ref 26.0–34.0)
MCHC: 32.5 g/dL (ref 30.0–36.0)
MCV: 82 fL (ref 80.0–100.0)
Monocytes Absolute: 0.5 10*3/uL (ref 0.1–1.0)
Monocytes Relative: 7 %
Neutro Abs: 3.6 10*3/uL (ref 1.7–7.7)
Neutrophils Relative %: 55 %
Platelet Count: 205 10*3/uL (ref 150–400)
RBC: 4.73 MIL/uL (ref 3.87–5.11)
RDW: 16.3 % — ABNORMAL HIGH (ref 11.5–15.5)
WBC Count: 6.6 10*3/uL (ref 4.0–10.5)
nRBC: 0 % (ref 0.0–0.2)

## 2023-03-30 LAB — CMP (CANCER CENTER ONLY)
ALT: 30 U/L (ref 0–44)
AST: 17 U/L (ref 15–41)
Albumin: 3.9 g/dL (ref 3.5–5.0)
Alkaline Phosphatase: 49 U/L (ref 38–126)
Anion gap: 7 (ref 5–15)
BUN: 13 mg/dL (ref 8–23)
CO2: 27 mmol/L (ref 22–32)
Calcium: 10.2 mg/dL (ref 8.9–10.3)
Chloride: 106 mmol/L (ref 98–111)
Creatinine: 0.66 mg/dL (ref 0.44–1.00)
GFR, Estimated: >60 mL/min (ref >60)
Glucose, Bld: 151 mg/dL — ABNORMAL HIGH (ref 70–99)
Potassium: 3.8 mmol/L (ref 3.5–5.1)
Sodium: 140 mmol/L (ref 135–145)
Total Bilirubin: 0.6 mg/dL (ref 0.0–1.2)
Total Protein: 6.6 g/dL (ref 6.5–8.1)

## 2023-03-30 LAB — LACTATE DEHYDROGENASE: LDH: 194 U/L — ABNORMAL HIGH (ref 98–192)

## 2023-03-30 MED ORDER — SODIUM CHLORIDE 0.9% FLUSH
10.0000 mL | INTRAVENOUS | Status: DC | PRN
  Administered 2023-03-30: 10 mL

## 2023-03-30 MED ORDER — SODIUM CHLORIDE 0.9 % IV SOLN
480.0000 mg | Freq: Once | INTRAVENOUS | Status: AC
  Administered 2023-03-30: 480 mg via INTRAVENOUS
  Filled 2023-03-30: qty 24

## 2023-03-30 MED ORDER — SODIUM CHLORIDE 0.9 % IV SOLN: Freq: Once | INTRAVENOUS | Status: AC

## 2023-03-30 MED ORDER — HEPARIN SOD (PORK) LOCK FLUSH 100 UNIT/ML IV SOLN
500.0000 [IU] | Freq: Once | INTRAVENOUS | Status: AC | PRN
Start: 2023-03-30 — End: 2023-03-30
  Administered 2023-03-30: 500 [IU]

## 2023-03-30 NOTE — Patient Instructions (Signed)

## 2023-03-30 NOTE — Patient Instructions (Signed)
[facility]  Discharge Instructions: Thank you for choosing [facility] to provide your oncology and hematology care.   If you have a lab appointment with the Cancer Center, please go directly to the Cancer Center and check in at the registration area.  Wear comfortable clothing and clothing appropriate for easy access to any Portacath or PICC line.   We strive to give you quality time with your provider. You may need to reschedule your appointment if you arrive late (15 or more minutes).  Arriving late affects you and other patients whose appointments are after yours.  Also, if you miss three or more appointments without notifying the office, you may be dismissed from the clinic at the provider's discretion.      For prescription refill requests, have your pharmacy contact our office and allow 72 hours for refills to be completed.    Today you received the following chemotherapy and/or immunotherapy agents Nivolumab    To help prevent nausea and vomiting after your treatment, we encourage you to take your nausea medication as directed.  BELOW ARE SYMPTOMS THAT SHOULD BE REPORTED IMMEDIATELY: *FEVER GREATER THAN 100.4 F (38 C) OR HIGHER *CHILLS OR SWEATING *NAUSEA AND VOMITING THAT IS NOT CONTROLLED WITH YOUR NAUSEA MEDICATION *UNUSUAL SHORTNESS OF BREATH *UNUSUAL BRUISING OR BLEEDING *URINARY PROBLEMS (pain or burning when urinating, or frequent urination) *BOWEL PROBLEMS (unusual diarrhea, constipation, pain near the anus) TENDERNESS IN MOUTH AND THROAT WITH OR WITHOUT PRESENCE OF ULCERS (sore throat, sores in mouth, or a toothache) UNUSUAL RASH, SWELLING OR PAIN  UNUSUAL VAGINAL DISCHARGE OR ITCHING   Items with * indicate a potential emergency and should be followed up as soon as possible or go to the Emergency Department if any problems should occur.  Please show the CHEMOTHERAPY ALERT CARD or IMMUNOTHERAPY  ALERT CARD at check-in to the Emergency Department and triage nurse. Should you have questions after your visit or need to cancel or reschedule your appointment, please contact [facility] - A DEPT OF Eligha Bridegroom [facility]  [phone] and follow the prompts.  Office hours are 8:00 a.m. to 4:30 p.m. Monday - Friday. Please note that voicemails left after 4:00 p.m. may not be returned until the following business day.  We are closed weekends and major holidays. You have access to a nurse at all times for urgent questions. Please call the main number to the clinic [phone] and follow the prompts.  For any non-urgent questions, you may also contact your provider using MyChart. We now offer e-Visits for anyone 14 and older to request care online for non-urgent symptoms. For details visit [email or web address].   Also download the MyChart app! Go to the app store, search "MyChart", open the app, select [place], and log in with your MyChart username and password.

## 2023-03-30 NOTE — Progress Notes (Signed)
Hematology and Oncology Follow Up Visit  [name] [name] [id number] [birth year] 78 y.o. 03/30/2023   Principle Diagnosis:  Hepatocellular carcinoma-multifocal Hepatitis C   Current Therapy:        Status post cycle 1 of nivolumab/ipilimumab -- d/c on 10/13/2020 due to hepatic toxicity Nivolumab 480 mg IV every 4 weeks - s/p cycle #23 -- started 11/21/2020 --changed to 480 mg on 08/2021 Nexavar 200 mg po BID -- to be taken while in [place] -- start on 02/26/2022 -- d/c on 06/16/2022   Interim History:  [name] is here today for follow-up.  She is doing quite nicely.  Her skin is doing much better.  I know that she sees her dermatologist.  I think she is on low-dose prednisone.  This should not affect the nivolumab at all.  Her last alpha-fetoprotein was always dated 5.1.  Her last MRI was back in September.  We really need to get another 1 to make sure everything is doing well.  Her blood sugars have been managing okay.  They not been too high.  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  I am not sure when she goes back to see the oculuist.  There is been no melena or hematochezia.  She has had no change in bowel or bladder habits.  There is been no cough or shortness of breath.  She has had no nausea.  There has been no headache.  Overall, I would say that her performance status is ECOG 0.    Medications:  Allergies as of 03/30/2023       Reactions   Voriconazole Shortness Of Breath, Swelling   Patient reported that tablets sent her to the ER   Bactrim [sulfamethoxazole-trimethoprim] Swelling, Other (See Comments)   Site of swelling not recalled   Clarithromycin Swelling, Rash, Other (See Comments)   Daughter is unsure if Clarithromycin or Amoxicillin caused rash and swelling. Patient states no allergy to amoxicillan - has taken this recently with no issues. (12/02/21)        Medication List        Accurate as of  March 30, 2023  9:05 AM. If you have any questions, ask your nurse or doctor.          Basaglar KwikPen 100 UNIT/ML Inject 14 Units into the skin daily.   brimonidine 0.15 % ophthalmic solution Commonly known as: ALPHAGAN Place into the left eye 2 (two) times daily.   brimonidine 0.15 % ophthalmic solution Commonly known as: ALPHAGAN Place 1 drop into the left eye 2 (two) times daily.   clobetasol ointment 0.05 % Commonly known as: TEMOVATE Apply topically 2 (two) times a day. To affected areas. Do not use to face or groin area   diphenhydramine-acetaminophen 25-500 MG Tabs tablet Commonly known as: TYLENOL PM Take by mouth.   gabapentin 300 MG capsule Commonly known as: NEURONTIN Take 1 capsule (300 mg total) by mouth 3 (three) times daily.   HumaLOG KwikPen 200 UNIT/ML KwikPen Generic drug: insulin lispro Max daily dose 100 units. What changed:  how much to take how to take this when to take this additional instructions   hydrALAZINE 10 MG tablet Commonly known as: APRESOLINE Take 1 tablet (10 mg total) by mouth in the morning and at bedtime.   levothyroxine 75 MCG tablet Commonly known as: Synthroid Take 1 tablet (75 mcg total) by mouth daily before breakfast.   lidocaine-prilocaine cream Commonly known as: EMLA Apply 1  Application topically as needed (for port access).   losartan 100 MG tablet Commonly known as: COZAAR Take 1 tablet (100 mg total) by mouth at bedtime.   NIFEdipine 30 MG 24 hr tablet Commonly known as: PROCARDIA-XL/NIFEDICAL-XL Take 1 tablet (30 mg total) by mouth in the morning and at bedtime.   Opdivo 100 MG/10ML Soln chemo injection Generic drug: nivolumab Inject into the vein.   pantoprazole 40 MG tablet Commonly known as: Protonix Take 1 tablet (40 mg total) by mouth 2 (two) times daily.   polyethylene glycol powder 17 GM/SCOOP powder Commonly known as: GLYCOLAX/MIRALAX Take 17 g by mouth daily as needed for mild  constipation.   predniSONE 20 MG tablet Commonly known as: DELTASONE Take 2 tablets (40 mg total) by mouth daily with breakfast. What changed: how much to take   predniSONE 10 MG tablet Commonly known as: DELTASONE Take 1 tablet (10 mg total) by mouth in the morning. What changed: Another medication with the same name was changed. Make sure you understand how and when to take each.   True Metrix Blood Glucose Test test strip Generic drug: glucose blood Use as instructed   TRUEplus 5-Bevel Pen Needles 32G X 4 MM Misc Generic drug: Insulin Pen Needle Use with insulins daily.        Allergies:  Allergies  Allergen Reactions   Voriconazole Shortness Of Breath and Swelling    Patient reported that tablets sent her to the ER   Bactrim [Sulfamethoxazole-Trimethoprim] Swelling and Other (See Comments)    Site of swelling not recalled   Clarithromycin Swelling, Rash and Other (See Comments)    Daughter is unsure if Clarithromycin or Amoxicillin caused rash and swelling.  Patient states no allergy to amoxicillan - has taken this recently with no issues. (12/02/21)    Past Medical History, Surgical history, Social history, and Family History were reviewed and updated.  Review of Systems: Review of Systems  Constitutional: Negative.   HENT: Negative.    Eyes: Negative.   Respiratory: Negative.    Cardiovascular: Negative.   Gastrointestinal: Negative.   Genitourinary: Negative.   Musculoskeletal: Negative.   Skin: Negative.   Neurological:  Positive for tingling.  Endo/Heme/Allergies: Negative.   Psychiatric/Behavioral: Negative.       Physical Exam: Vital signs with temperature of 98.1.  Pulse 86.  Blood pressure 147/76.  Weight is 170s pounds.    Wt Readings from Last 3 Encounters:  03/30/23 170 lb (77.1 kg)  03/02/23 167 lb 6.4 oz (75.9 kg)  02/02/23 162 lb (73.5 kg)    Physical Exam Vitals reviewed.  HENT:     Head: Normocephalic and atraumatic.  Eyes:      Pupils: Pupils are equal, round, and reactive to light.     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     Cardiovascular:     Rate and Rhythm: Normal rate and regular rhythm.     Heart sounds: Normal heart sounds.  Pulmonary:     Effort: Pulmonary effort is normal.     Breath sounds: Normal breath sounds.  Abdominal:     General: Bowel sounds are normal.     Palpations: Abdomen is soft.     Comments: Abdominal exam shows a well-healed laparotomy scar.  She has no fluid wave.  There is no guarding or rebound tenderness to palpation.  There is no palpable liver or spleen tip.  Musculoskeletal:  General: No tenderness or deformity. Normal range of motion.     Cervical back: Normal range of motion.  Lymphadenopathy:     Cervical: No cervical adenopathy.  Skin:    General: Skin is warm and dry.     Findings: No erythema or rash.     Comments: Skin exam does show a  papular rash on her lower legs.  This is above and below the knees.  These lesions are somewhat firm.  They are raised.  They are none tender.    Neurological:     Mental Status: She is alert and oriented to person, place, and time.  Psychiatric:        Behavior: Behavior normal.        Thought Content: Thought content normal.        Judgment: Judgment normal.       Lab Results  Component Value Date   WBC 6.6 03/30/2023   HGB 12.6 03/30/2023   HCT 38.8 03/30/2023   MCV 82.0 03/30/2023   PLT 205 03/30/2023   Lab Results  Component Value Date   FERRITIN 603 (H) 10/02/2020   IRON 31 (L) 10/02/2020   TIBC 241 10/02/2020   UIBC 211 10/02/2020   IRONPCTSAT 13 (L) 10/02/2020   Lab Results  Component Value Date   RBC 4.73 03/30/2023   No results found for: "KPAFRELGTCHN", "LAMBDASER", "KAPLAMBRATIO" No results found for: "IGGSERUM", "IGA", "IGMSERUM" No results found for: "TOTALPROTELP", "ALBUMINELP", "A1GS", "A2GS", "BETS", "BETA2SER", "GAMS", "MSPIKE",  "SPEI"   Chemistry      Component Value Date/Time   NA 140 03/30/2023 0810   NA 140 01/22/2022 0849   K 3.8 03/30/2023 0810   CL 106 03/30/2023 0810   CO2 27 03/30/2023 0810   BUN 13 03/30/2023 0810   BUN 11 01/22/2022 0849   CREATININE 0.66 03/30/2023 0810   CREATININE 0.58 (L) 08/20/2021 1541      Component Value Date/Time   CALCIUM 10.2 03/30/2023 0810   ALKPHOS 49 03/30/2023 0810   AST 17 03/30/2023 0810   ALT 30 03/30/2023 0810   BILITOT 0.6 03/30/2023 0810       Impression and Plan: [name] is a very pleasant 78 yo female from [place] with hepatocellular carcinoma and Hepatitis C.  She actually has had treatment for the Hepatitis C.  I am sure that this is part of the reason why she has done so well with response.  Again, the hepatocellular carcinoma really is doing quite nicely.  We will verify this with the MRI.  Will get the MRI set up for her in about a month.  I am just happy that the right eye prosthetic seems to be managing.  To me, looks quite natural.  Her blood sugars are doing pretty well right now.  I know she is very diligent with using insulin.  We will go ahead and plan to get her treated and then have her come back in a month.   [name], MD 2/12/20259:05 AM

## 2023-03-31 ENCOUNTER — Other Ambulatory Visit: Payer: Self-pay

## 2023-03-31 ENCOUNTER — Encounter: Payer: Self-pay | Admitting: Hematology & Oncology

## 2023-03-31 LAB — AFP TUMOR MARKER: AFP, Serum, Tumor Marker: 5 ng/mL (ref 0.0–9.2)

## 2023-04-11 ENCOUNTER — Other Ambulatory Visit: Payer: Self-pay

## 2023-04-15 ENCOUNTER — Other Ambulatory Visit: Payer: Self-pay

## 2023-04-15 ENCOUNTER — Encounter: Payer: Self-pay | Admitting: Hematology & Oncology

## 2023-04-20 ENCOUNTER — Encounter: Payer: Self-pay | Admitting: *Deleted

## 2023-04-20 ENCOUNTER — Ambulatory Visit (HOSPITAL_BASED_OUTPATIENT_CLINIC_OR_DEPARTMENT_OTHER)
Admission: RE | Admit: 2023-04-20 | Discharge: 2023-04-20 | Disposition: A | Discharge status: Home / Self Care | Payer: Self-pay | Source: Ambulatory Visit | Attending: Hematology & Oncology | Admitting: Hematology & Oncology

## 2023-04-20 DIAGNOSIS — C220 Liver cell carcinoma: Secondary | ICD-10-CM | POA: Insufficient documentation

## 2023-04-20 MED ORDER — GADOBUTROL 1 MMOL/ML IV SOLN
7.5000 mL | Freq: Once | INTRAVENOUS | Status: AC | PRN
  Administered 2023-04-20: 7.5 mL via INTRAVENOUS

## 2023-04-21 ENCOUNTER — Encounter: Payer: Self-pay | Admitting: Hematology & Oncology

## 2023-04-27 ENCOUNTER — Inpatient Hospital Stay: Payer: Self-pay | Attending: Hematology & Oncology

## 2023-04-27 ENCOUNTER — Inpatient Hospital Stay (HOSPITAL_BASED_OUTPATIENT_CLINIC_OR_DEPARTMENT_OTHER): Payer: Self-pay | Admitting: Family

## 2023-04-27 ENCOUNTER — Inpatient Hospital Stay: Payer: Self-pay

## 2023-04-27 VITALS — BP 158/62 | HR 96 | Temp 98.3°F | Resp 18 | Wt 171.0 lb

## 2023-04-27 DIAGNOSIS — C220 Liver cell carcinoma: Secondary | ICD-10-CM

## 2023-04-27 DIAGNOSIS — Z79899 Other long term (current) drug therapy: Secondary | ICD-10-CM | POA: Insufficient documentation

## 2023-04-27 DIAGNOSIS — E032 Hypothyroidism due to medicaments and other exogenous substances: Secondary | ICD-10-CM

## 2023-04-27 DIAGNOSIS — Z5112 Encounter for antineoplastic immunotherapy: Secondary | ICD-10-CM | POA: Insufficient documentation

## 2023-04-27 LAB — CBC WITH DIFFERENTIAL (CANCER CENTER ONLY)
Abs Immature Granulocytes: 0.01 10*3/uL (ref 0.00–0.07)
Basophils Absolute: 0 10*3/uL (ref 0.0–0.1)
Basophils Relative: 1 %
Eosinophils Absolute: 0 10*3/uL (ref 0.0–0.5)
Eosinophils Relative: 0 %
HCT: 38.5 % (ref 36.0–46.0)
Hemoglobin: 12.2 g/dL (ref 12.0–15.0)
Immature Granulocytes: 0 %
Lymphocytes Relative: 18 %
Lymphs Abs: 1.1 10*3/uL (ref 0.7–4.0)
MCH: 26 pg (ref 26.0–34.0)
MCHC: 31.7 g/dL (ref 30.0–36.0)
MCV: 82.1 fL (ref 80.0–100.0)
Monocytes Absolute: 0.3 10*3/uL (ref 0.1–1.0)
Monocytes Relative: 5 %
Neutro Abs: 4.7 10*3/uL (ref 1.7–7.7)
Neutrophils Relative %: 76 %
Platelet Count: 211 10*3/uL (ref 150–400)
RBC: 4.69 MIL/uL (ref 3.87–5.11)
RDW: 17.2 % — ABNORMAL HIGH (ref 11.5–15.5)
WBC Count: 6.1 10*3/uL (ref 4.0–10.5)
nRBC: 0 % (ref 0.0–0.2)

## 2023-04-27 LAB — CMP (CANCER CENTER ONLY)
ALT: 19 U/L (ref 0–44)
AST: 13 U/L — ABNORMAL LOW (ref 15–41)
Albumin: 4.1 g/dL (ref 3.5–5.0)
Alkaline Phosphatase: 54 U/L (ref 38–126)
Anion gap: 7 (ref 5–15)
BUN: 17 mg/dL (ref 8–23)
CO2: 24 mmol/L (ref 22–32)
Calcium: 9.6 mg/dL (ref 8.9–10.3)
Chloride: 108 mmol/L (ref 98–111)
Creatinine: 0.68 mg/dL (ref 0.44–1.00)
GFR, Estimated: >60 mL/min (ref >60)
Glucose, Bld: 127 mg/dL — ABNORMAL HIGH (ref 70–99)
Potassium: 3.7 mmol/L (ref 3.5–5.1)
Sodium: 139 mmol/L (ref 135–145)
Total Bilirubin: 0.4 mg/dL (ref 0.0–1.2)
Total Protein: 6.6 g/dL (ref 6.5–8.1)

## 2023-04-27 LAB — TSH: TSH: 1.839 u[IU]/mL (ref 0.350–4.500)

## 2023-04-27 LAB — LACTATE DEHYDROGENASE: LDH: 176 U/L (ref 98–192)

## 2023-04-27 MED ORDER — ZOLEDRONIC ACID 4 MG/100ML IV SOLN
4.0000 mg | Freq: Once | INTRAVENOUS | Status: AC
Start: 2023-04-27 — End: 2023-04-27
  Administered 2023-04-27: 4 mg via INTRAVENOUS
  Filled 2023-04-27: qty 100

## 2023-04-27 MED ORDER — HEPARIN SOD (PORK) LOCK FLUSH 100 UNIT/ML IV SOLN
500.0000 [IU] | Freq: Once | INTRAVENOUS | Status: AC | PRN
Start: 2023-04-27 — End: 2023-04-27
  Administered 2023-04-27: 500 [IU]

## 2023-04-27 MED ORDER — SODIUM CHLORIDE 0.9 % IV SOLN
480.0000 mg | Freq: Once | INTRAVENOUS | Status: AC
  Administered 2023-04-27: 480 mg via INTRAVENOUS
  Filled 2023-04-27: qty 48

## 2023-04-27 MED ORDER — SODIUM CHLORIDE 0.9 % IV SOLN: Freq: Once | INTRAVENOUS | Status: AC

## 2023-04-27 MED ORDER — SODIUM CHLORIDE 0.9% FLUSH
10.0000 mL | INTRAVENOUS | Status: DC | PRN
  Administered 2023-04-27: 10 mL

## 2023-04-27 NOTE — Patient Instructions (Signed)

## 2023-04-27 NOTE — Patient Instructions (Addendum)
 [facility] OF [facility]  Discharge Instructions: Thank you for choosing [facility] to provide your oncology and hematology care.   If you have a lab appointment with the Cancer Center, please go directly to the Cancer Center and check in at the registration area.  Wear comfortable clothing and clothing appropriate for easy access to any Portacath or PICC line.   We strive to give you quality time with your provider. You may need to reschedule your appointment if you arrive late (15 or more minutes).  Arriving late affects you and other patients whose appointments are after yours.  Also, if you miss three or more appointments without notifying the office, you may be dismissed from the clinic at the provider's discretion.      For prescription refill requests, have your pharmacy contact our office and allow 72 hours for refills to be completed.    Today you received the following chemotherapy and/or immunotherapy agents Nivolumab      To help prevent nausea and vomiting after your treatment, we encourage you to take your nausea medication as directed.  BELOW ARE SYMPTOMS THAT SHOULD BE REPORTED IMMEDIATELY: *FEVER GREATER THAN 100.4 F (38 C) OR HIGHER *CHILLS OR SWEATING *NAUSEA AND VOMITING THAT IS NOT CONTROLLED WITH YOUR NAUSEA MEDICATION *UNUSUAL SHORTNESS OF BREATH *UNUSUAL BRUISING OR BLEEDING *URINARY PROBLEMS (pain or burning when urinating, or frequent urination) *BOWEL PROBLEMS (unusual diarrhea, constipation, pain near the anus) TENDERNESS IN MOUTH AND THROAT WITH OR WITHOUT PRESENCE OF ULCERS (sore throat, sores in mouth, or a toothache) UNUSUAL RASH, SWELLING OR PAIN  UNUSUAL VAGINAL DISCHARGE OR ITCHING   Items with * indicate a potential emergency and should be followed up as soon as possible or go to the Emergency Department if any problems should occur.  Please show the CHEMOTHERAPY ALERT CARD or IMMUNOTHERAPY  ALERT CARD at check-in to the Emergency Department and triage nurse. Should you have questions after your visit or need to cancel or reschedule your appointment, please contact [facility] - A DEPT OF [facility]ideg[facility]  [phone] and follow the prompts.  Office hours are 8:00 a.m. to 4:30 p.m. Monday - Friday. Please note that voicemails left after 4:00 p.m. may not be returned until the following business day.  We are closed weekends and major holidays. You have access to a nurse at all times for urgent questions. Please call the main number to the clinic [phone] and follow the prompts.  For any non-urgent questions, you may also contact your provider using MyChart. We now offer e-Visits for anyone 35 and older to request care online for non-urgent symptoms. For details visit [email or web address].   Also download the MyChart app! Go to the app store, search "MyChart", open the app, select [place], and log in with your MyChart username and password.  [facility]  Discharge Instructions: Thank you for choosing [facility] to provide your oncology and hematology care.   If you have a lab appointment with the Cancer Center, please go directly to the Cancer Center and check in at the registration area.  Wear comfortable clothing and clothing appropriate for easy access to any Portacath or PICC line.   We strive to give you quality time with your provider. You may need to reschedule your appointment if you arrive late (15 or more  minutes).  Arriving late affects you and other patients whose appointments are after yours.  Also, if you miss three or more appointments without notifying the office, you may be dismissed from the clinic at the provider's discretion.      For prescription refill requests, have your pharmacy contact our office and allow 72 hours for refills to be completed.    Today  you received the following chemotherapy and/or immunotherapy agents opdivo, zometa    To help prevent nausea and vomiting after your treatment, we encourage you to take your nausea medication as directed.  BELOW ARE SYMPTOMS THAT SHOULD BE REPORTED IMMEDIATELY: *FEVER GREATER THAN 100.4 F (38 C) OR HIGHER *CHILLS OR SWEATING *NAUSEA AND VOMITING THAT IS NOT CONTROLLED WITH YOUR NAUSEA MEDICATION *UNUSUAL SHORTNESS OF BREATH *UNUSUAL BRUISING OR BLEEDING *URINARY PROBLEMS (pain or burning when urinating, or frequent urination) *BOWEL PROBLEMS (unusual diarrhea, constipation, pain near the anus) TENDERNESS IN MOUTH AND THROAT WITH OR WITHOUT PRESENCE OF ULCERS (sore throat, sores in mouth, or a toothache) UNUSUAL RASH, SWELLING OR PAIN  UNUSUAL VAGINAL DISCHARGE OR ITCHING   Items with * indicate a potential emergency and should be followed up as soon as possible or go to the Emergency Department if any problems should occur.  Please show the CHEMOTHERAPY ALERT CARD or IMMUNOTHERAPY ALERT CARD at check-in to the Emergency Department and triage nurse. Should you have questions after your visit or need to cancel or reschedule your appointment, please contact [facility] - A DEPT OF [facility] Bridegroom [facility]  [phone] and follow the prompts.  Office hours are 8:00 a.m. to 4:30 p.m. Monday - Friday. Please note that voicemails left after 4:00 p.m. may not be returned until the following business day.  We are closed weekends and major holidays. You have access to a nurse at all times for urgent questions. Please call the main number to the clinic [phone] and follow the prompts.  For any non-urgent questions, you may also contact your provider using MyChart. We now offer e-Visits for anyone 70 and older to request care online for non-urgent symptoms. For details visit [email or web address].   Also download the MyChart app! Go to the app store, search "MyChart", open the  app, select [place], and log in with your MyChart username and password.

## 2023-04-27 NOTE — Progress Notes (Signed)
 Hematology and Oncology Follow Up Visit  [name] [name] [name] [name] [id number] 1945/10/10 78 y.o. 04/27/2023   Principle Diagnosis:  Hepatocellular carcinoma-multifocal Hepatitis C   Current Therapy:        Status post cycle 1 of nivolumab/ipilimumab -- d/c on 10/13/2020 due to hepatic toxicity Nivolumab 480 mg IV every 4 weeks - s/p cycle #23 -- started 11/21/2020 --changed to 480 mg on 08/2021 Nexavar 200 mg po BID -- to be taken while in [place] -- start on 02/26/2022 -- d/c on 06/16/2022 Zometa every 3 months - due again 07/2023   Interim History:  [name] is here today for follow-up and treatment. She is doing quite well and has no complaints at this time.  AFP at last visit was stable at 5.0.  TSH is pending.  The rash with raised bumps on her legs is healing and there are no open sores at this time. She states that the low dose prednisone has helped.  No fatigue.  She is eating well and working on staying well hydrated. Weight is stable at 171 lbs.  No fever, chills, n/v, cough, dizziness, SOB, chest pain, palpitations, abdominal pain or changes in bowel or bladder habits at this time.  She notes mild SOB with over exertion on steps.  No swelling in her extremities.  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  No falls or syncope reported.  She has not noted any blood loss. No bruising or petechiae.   ECOG Performance Status: 1 - Symptomatic but completely ambulatory  Medications:  Allergies as of 04/27/2023       Reactions   Voriconazole Shortness Of Breath, Swelling   Patient reported that tablets sent her to the ER   Bactrim [sulfamethoxazole-trimethoprim] Swelling, Other (See Comments)   Site of swelling not recalled   Clarithromycin Swelling, Rash, Other (See Comments)   Daughter is unsure if Clarithromycin or Amoxicillin caused rash and swelling. Patient states no allergy to amoxicillan - has taken this recently with no issues.  (12/02/21)        Medication List        Accurate as of April 27, 2023 12:20 PM. If you have any questions, ask your nurse or doctor.          Basaglar KwikPen 100 UNIT/ML Inject 14 Units into the skin daily.   brimonidine 0.15 % ophthalmic solution Commonly known as: ALPHAGAN Place into the left eye 2 (two) times daily.   brimonidine 0.15 % ophthalmic solution Commonly known as: ALPHAGAN Place 1 drop into the left eye 2 (two) times daily.   clobetasol ointment 0.05 % Commonly known as: TEMOVATE Apply topically 2 (two) times a day. To affected areas. Do not use to face or groin area   diphenhydramine-acetaminophen 25-500 MG Tabs tablet Commonly known as: TYLENOL PM Take by mouth.   gabapentin 300 MG capsule Commonly known as: NEURONTIN Take 1 capsule (300 mg total) by mouth 3 (three) times daily.   HumaLOG KwikPen 200 UNIT/ML KwikPen Generic drug: insulin lispro Max daily dose 100 units. What changed:  how much to take how to take this when to take this additional instructions   hydrALAZINE 10 MG tablet Commonly known as: APRESOLINE Take 1 tablet (10 mg total) by mouth in the morning and at bedtime.   levothyroxine 75 MCG tablet Commonly known as: Synthroid Take 1 tablet (75 mcg total) by mouth daily before breakfast.   lidocaine-prilocaine cream Commonly known as: EMLA Apply 1 Application topically as  needed (for port access).   losartan 100 MG tablet Commonly known as: COZAAR Take 1 tablet (100 mg total) by mouth at bedtime.   NIFEdipine 30 MG 24 hr tablet Commonly known as: PROCARDIA-XL/NIFEDICAL-XL Take 1 tablet (30 mg total) by mouth in the morning and at bedtime.   Opdivo 100 MG/10ML Soln chemo injection Generic drug: nivolumab Inject into the vein.   pantoprazole 40 MG tablet Commonly known as: Protonix Take 1 tablet (40 mg total) by mouth 2 (two) times daily.   polyethylene glycol powder 17 GM/SCOOP powder Commonly known as:  GLYCOLAX/MIRALAX Take 17 g by mouth daily as needed for mild constipation.   predniSONE 10 MG tablet Commonly known as: DELTASONE Take 1 tablet (10 mg total) by mouth in the morning.   True Metrix Blood Glucose Test test strip Generic drug: glucose blood Use as instructed   TRUEplus 5-Bevel Pen Needles 32G X 4 MM Misc Generic drug: Insulin Pen Needle Use with insulins daily.        Allergies:  Allergies  Allergen Reactions   Voriconazole Shortness Of Breath and Swelling    Patient reported that tablets sent her to the ER   Bactrim [Sulfamethoxazole-Trimethoprim] Swelling and Other (See Comments)    Site of swelling not recalled   Clarithromycin Swelling, Rash and Other (See Comments)    Daughter is unsure if Clarithromycin or Amoxicillin caused rash and swelling.  Patient states no allergy to amoxicillan - has taken this recently with no issues. (12/02/21)    Past Medical History, Surgical history, Social history, and Family History were reviewed and updated.  Review of Systems: All other 10 point review of systems is negative.   Physical Exam:  vitals were not taken for this visit.   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  Ear-nose-throat: Oropharynx clear, dentition fair Lymphatic: No cervical or supraclavicular adenopathy Lungs no rales or rhonchi, good excursion bilaterally Heart regular rate and rhythm, no murmur appreciated Abd soft, nontender, positive bowel sounds MSK no focal spinal tenderness, no joint edema Neuro: non-focal, well-oriented, appropriate affect Breasts: Deferred   Lab Results  Component Value Date   WBC 6.1 04/27/2023   HGB 12.2 04/27/2023   HCT 38.5 04/27/2023   MCV 82.1 04/27/2023   PLT 211 04/27/2023   Lab Results  Component Value Date   FERRITIN  603 (H) 10/02/2020   IRON 31 (L) 10/02/2020   TIBC 241 10/02/2020   UIBC 211 10/02/2020   IRONPCTSAT 13 (L) 10/02/2020   Lab Results  Component Value Date   RBC 4.69 04/27/2023   No results found for: "KPAFRELGTCHN", "LAMBDASER", "KAPLAMBRATIO" No results found for: "IGGSERUM", "IGA", "IGMSERUM" No results found for: "TOTALPROTELP", "ALBUMINELP", "A1GS", "A2GS", "BETS", "BETA2SER", "GAMS", "MSPIKE", "SPEI"   Chemistry      Component Value Date/Time   NA 140 03/30/2023 0810   NA 140 01/22/2022 0849   K 3.8 03/30/2023 0810   CL 106 03/30/2023 0810   CO2 27 03/30/2023 0810   BUN 13 03/30/2023 0810   BUN 11 01/22/2022 0849   CREATININE 0.66 03/30/2023 0810   CREATININE 0.58 (L) 08/20/2021 1541      Component Value Date/Time   CALCIUM 10.2 03/30/2023 0810   ALKPHOS 49 03/30/2023 0810   AST 17 03/30/2023 0810  ALT 30 03/30/2023 0810   BILITOT 0.6 03/30/2023 0810       Impression and Plan: [name] [name] is a very pleasant 78 yo female from [place] with hepatocellular carcinoma and Hepatitis C. She has had treatment for the Hepatitis C.  She continues to tolerate treatment with Opdivo nicely. We will proceed with treatment today including Zometa as planned.   MRI last month showed stable disease.  Follow-up in 1 month.   Eileen Stanford, NP 3/12/202512:20 PM

## 2023-04-28 ENCOUNTER — Other Ambulatory Visit: Payer: Self-pay

## 2023-04-28 LAB — AFP TUMOR MARKER: AFP, Serum, Tumor Marker: 5.4 ng/mL (ref 0.0–9.2)

## 2023-05-02 ENCOUNTER — Other Ambulatory Visit: Payer: Self-pay

## 2023-05-02 ENCOUNTER — Encounter: Payer: Self-pay | Admitting: Hematology & Oncology

## 2023-05-04 ENCOUNTER — Other Ambulatory Visit: Payer: Self-pay | Admitting: Internal Medicine

## 2023-05-04 ENCOUNTER — Other Ambulatory Visit: Payer: Self-pay

## 2023-05-16 ENCOUNTER — Other Ambulatory Visit: Payer: Self-pay

## 2023-05-19 ENCOUNTER — Other Ambulatory Visit: Payer: Self-pay

## 2023-05-20 ENCOUNTER — Encounter: Payer: Self-pay | Admitting: Hematology & Oncology

## 2023-05-20 ENCOUNTER — Other Ambulatory Visit: Payer: Self-pay

## 2023-05-21 ENCOUNTER — Other Ambulatory Visit: Payer: Self-pay

## 2023-05-24 ENCOUNTER — Other Ambulatory Visit: Payer: Self-pay

## 2023-05-25 ENCOUNTER — Inpatient Hospital Stay: Payer: Self-pay

## 2023-05-25 ENCOUNTER — Encounter: Payer: Self-pay | Admitting: Hematology & Oncology

## 2023-05-25 ENCOUNTER — Inpatient Hospital Stay (HOSPITAL_BASED_OUTPATIENT_CLINIC_OR_DEPARTMENT_OTHER): Payer: Self-pay | Admitting: Hematology & Oncology

## 2023-05-25 ENCOUNTER — Inpatient Hospital Stay: Payer: Self-pay | Attending: Hematology & Oncology

## 2023-05-25 VITALS — BP 146/58 | HR 80 | Temp 98.0°F | Resp 18 | Ht 63.0 in | Wt 175.0 lb

## 2023-05-25 DIAGNOSIS — E032 Hypothyroidism due to medicaments and other exogenous substances: Secondary | ICD-10-CM

## 2023-05-25 DIAGNOSIS — C220 Liver cell carcinoma: Secondary | ICD-10-CM

## 2023-05-25 DIAGNOSIS — Z79899 Other long term (current) drug therapy: Secondary | ICD-10-CM | POA: Insufficient documentation

## 2023-05-25 DIAGNOSIS — Z5112 Encounter for antineoplastic immunotherapy: Secondary | ICD-10-CM | POA: Insufficient documentation

## 2023-05-25 LAB — CBC WITH DIFFERENTIAL (CANCER CENTER ONLY)
Abs Immature Granulocytes: 0.02 10*3/uL (ref 0.00–0.07)
Basophils Absolute: 0 10*3/uL (ref 0.0–0.1)
Basophils Relative: 0 %
Eosinophils Absolute: 0 10*3/uL (ref 0.0–0.5)
Eosinophils Relative: 0 %
HCT: 37.8 % (ref 36.0–46.0)
Hemoglobin: 12 g/dL (ref 12.0–15.0)
Immature Granulocytes: 0 %
Lymphocytes Relative: 23 %
Lymphs Abs: 1.1 10*3/uL (ref 0.7–4.0)
MCH: 25.6 pg — ABNORMAL LOW (ref 26.0–34.0)
MCHC: 31.7 g/dL (ref 30.0–36.0)
MCV: 80.8 fL (ref 80.0–100.0)
Monocytes Absolute: 0.3 10*3/uL (ref 0.1–1.0)
Monocytes Relative: 6 %
Neutro Abs: 3.4 10*3/uL (ref 1.7–7.7)
Neutrophils Relative %: 71 %
Platelet Count: 192 10*3/uL (ref 150–400)
RBC: 4.68 MIL/uL (ref 3.87–5.11)
RDW: 17.9 % — ABNORMAL HIGH (ref 11.5–15.5)
WBC Count: 4.9 10*3/uL (ref 4.0–10.5)
nRBC: 0 % (ref 0.0–0.2)

## 2023-05-25 LAB — CMP (CANCER CENTER ONLY)
ALT: 15 U/L (ref 0–44)
AST: 11 U/L — ABNORMAL LOW (ref 15–41)
Albumin: 4.2 g/dL (ref 3.5–5.0)
Alkaline Phosphatase: 50 U/L (ref 38–126)
Anion gap: 6 (ref 5–15)
BUN: 14 mg/dL (ref 8–23)
CO2: 27 mmol/L (ref 22–32)
Calcium: 10.6 mg/dL — ABNORMAL HIGH (ref 8.9–10.3)
Chloride: 106 mmol/L (ref 98–111)
Creatinine: 0.62 mg/dL (ref 0.44–1.00)
GFR, Estimated: >60 mL/min (ref >60)
Glucose, Bld: 130 mg/dL — ABNORMAL HIGH (ref 70–99)
Potassium: 3.5 mmol/L (ref 3.5–5.1)
Sodium: 139 mmol/L (ref 135–145)
Total Bilirubin: 0.4 mg/dL (ref 0.0–1.2)
Total Protein: 6.7 g/dL (ref 6.5–8.1)

## 2023-05-25 LAB — TSH: TSH: 2.13 u[IU]/mL (ref 0.350–4.500)

## 2023-05-25 LAB — LACTATE DEHYDROGENASE: LDH: 206 U/L — ABNORMAL HIGH (ref 98–192)

## 2023-05-25 MED ORDER — SODIUM CHLORIDE 0.9 % IV SOLN: Freq: Once | INTRAVENOUS | Status: AC

## 2023-05-25 MED ORDER — SODIUM CHLORIDE 0.9% FLUSH
10.0000 mL | INTRAVENOUS | Status: DC | PRN
  Administered 2023-05-25: 10 mL

## 2023-05-25 MED ORDER — SODIUM CHLORIDE 0.9 % IV SOLN
480.0000 mg | Freq: Once | INTRAVENOUS | Status: AC
  Administered 2023-05-25: 480 mg via INTRAVENOUS
  Filled 2023-05-25: qty 48

## 2023-05-25 MED ORDER — HEPARIN SOD (PORK) LOCK FLUSH 100 UNIT/ML IV SOLN
500.0000 [IU] | Freq: Once | INTRAVENOUS | Status: AC | PRN
Start: 2023-05-25 — End: 2023-05-25
  Administered 2023-05-25: 500 [IU]

## 2023-05-25 NOTE — Progress Notes (Signed)
 Hematology and Oncology Follow Up Visit  [name] [name] [name] [name] [id number] 11-25-1945 78 y.o. 05/25/2023   Principle Diagnosis:  Hepatocellular carcinoma-multifocal Hepatitis C   Current Therapy:        Status post cycle 1 of nivolumab/ipilimumab -- d/c on 10/13/2020 due to hepatic toxicity Nivolumab 480 mg IV every 4 weeks - s/p cycle #25 -- started 11/21/2020 --changed to 480 mg on 08/2021 Nexavar 200 mg po BID -- to be taken while in [place] -- start on 02/26/2022 -- d/c on 06/16/2022   Interim History:  [name] [name] [name] is here today for follow-up.  She is doing quite nicely.  Her skin is doing much better.  She is on prednisone at 10 mg a day.  Her dermatologist has done a fantastic job and trying to manage the skin toxicity from her treatments.  She did have the MRI that was done back on 04/27/2023.  This showed everything was very stable.  The liver disease had not progressed.  Everything looked fantastic.  Her last alpha-fetoprotein was 5.4.  She has had no abdominal pain.  She has had a little bit of bloating.  I told her to try MiraLAX twice a day.  She has had no diarrhea.  Her blood sugars have been managed pretty well..  She has had no cough or shortness of breath..  She has had no bleeding.  Overall, I would say that her performance status is probably ECOG 1.      Medications:  Allergies as of 05/25/2023       Reactions   Voriconazole Shortness Of Breath, Swelling   Patient reported that tablets sent her to the ER   Bactrim [sulfamethoxazole-trimethoprim] Swelling, Other (See Comments)   Site of swelling not recalled   Clarithromycin Swelling, Rash, Other (See Comments)   Daughter is unsure if Clarithromycin or Amoxicillin caused rash and swelling. Patient states no allergy to amoxicillan - has taken this recently with no issues. (12/02/21)        Medication List        Accurate as of May 25, 2023  1:05 PM. If you have any questions, ask  your nurse or doctor.          Basaglar KwikPen 100 UNIT/ML Inject 14 Units into the skin daily.   brimonidine 0.15 % ophthalmic solution Commonly known as: ALPHAGAN Place into the left eye 2 (two) times daily.   brimonidine 0.15 % ophthalmic solution Commonly known as: ALPHAGAN Place 1 drop into the left eye 2 (two) times daily.   diphenhydramine-acetaminophen 25-500 MG Tabs tablet Commonly known as: TYLENOL PM Take by mouth.   gabapentin 300 MG capsule Commonly known as: NEURONTIN Take 1 capsule (300 mg total) by mouth 3 (three) times daily.   HumaLOG KwikPen 200 UNIT/ML KwikPen Generic drug: insulin lispro Max daily dose 100 units. What changed:  how much to take how to take this when to take this additional instructions   hydrALAZINE 10 MG tablet Commonly known as: APRESOLINE Take 1 tablet (10 mg total) by mouth in the morning and at bedtime.   levothyroxine 75 MCG tablet Commonly known as: Synthroid Take 1 tablet (75 mcg total) by mouth daily before breakfast.   lidocaine-prilocaine cream Commonly known as: EMLA Apply 1 Application topically as needed (for port access).   losartan 100 MG tablet Commonly known as: COZAAR Take 1 tablet (100 mg total) by mouth at bedtime.   NIFEdipine 30 MG 24 hr tablet Commonly known as: PROCARDIA-XL/NIFEDICAL-XL  Take 1 tablet (30 mg total) by mouth in the morning and at bedtime.   Opdivo 100 MG/10ML Soln chemo injection Generic drug: nivolumab Inject into the vein.   pantoprazole 40 MG tablet Commonly known as: Protonix Take 1 tablet (40 mg total) by mouth 2 (two) times daily.   polyethylene glycol powder 17 GM/SCOOP powder Commonly known as: GLYCOLAX/MIRALAX Take 17 g by mouth daily as needed for mild constipation.   predniSONE 10 MG tablet Commonly known as: DELTASONE Take 1 tablet (10 mg total) by mouth in the morning.   True Metrix Blood Glucose Test test strip Generic drug: glucose blood Use as  instructed   TRUEplus 5-Bevel Pen Needles 32G X 4 MM Misc Generic drug: Insulin Pen Needle Use with insulins daily.        Allergies:  Allergies  Allergen Reactions   Voriconazole Shortness Of Breath and Swelling    Patient reported that tablets sent her to the ER   Bactrim [Sulfamethoxazole-Trimethoprim] Swelling and Other (See Comments)    Site of swelling not recalled   Clarithromycin Swelling, Rash and Other (See Comments)    Daughter is unsure if Clarithromycin or Amoxicillin caused rash and swelling.  Patient states no allergy to amoxicillan - has taken this recently with no issues. (12/02/21)    Past Medical History, Surgical history, Social history, and Family History were reviewed and updated.  Review of Systems: Review of Systems  Constitutional: Negative.   HENT: Negative.    Eyes: Negative.   Respiratory: Negative.    Cardiovascular: Negative.   Gastrointestinal: Negative.   Genitourinary: Negative.   Musculoskeletal: Negative.   Skin: Negative.   Neurological:  Positive for tingling.  Endo/Heme/Allergies: Negative.   Psychiatric/Behavioral: Negative.       Physical Exam: Vital signs with temperature of 98.  Pulse 80.  Blood pressure 146/58.  Weight is 175 pounds.     Wt Readings from Last 3 Encounters:  05/25/23 175 lb (79.4 kg)  04/27/23 171 lb (77.6 kg)  03/30/23 170 lb (77.1 kg)    Physical Exam Vitals reviewed.  HENT:     Head: Normocephalic and atraumatic.  Eyes:     Pupils: Pupils are equal, round, and reactive to light.     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     Cardiovascular:     Rate and Rhythm: Normal rate and regular rhythm.     Heart sounds: Normal heart sounds.  Pulmonary:     Effort: Pulmonary effort is normal.     Breath sounds: Normal breath sounds.  Abdominal:     General: Bowel sounds are normal.     Palpations: Abdomen is soft.     Comments: Abdominal exam shows  a well-healed laparotomy scar.  She has no fluid wave.  There is no guarding or rebound tenderness to palpation.  There is no palpable liver or spleen tip.  Musculoskeletal:        General: No tenderness or deformity. Normal range of motion.     Cervical back: Normal range of motion.  Lymphadenopathy:     Cervical: No cervical adenopathy.  Skin:    General: Skin is warm and dry.     Findings: No erythema or rash.     Comments: Skin exam does show a  papular rash on her lower legs.  This is above and below the knees.  These lesions are somewhat firm.  They are raised.  They  are none tender.    Neurological:     Mental Status: She is alert and oriented to person, place, and time.  Psychiatric:        Behavior: Behavior normal.        Thought Content: Thought content normal.        Judgment: Judgment normal.      Lab Results  Component Value Date   WBC 4.9 05/25/2023   HGB 12.0 05/25/2023   HCT 37.8 05/25/2023   MCV 80.8 05/25/2023   PLT 192 05/25/2023   Lab Results  Component Value Date   FERRITIN 603 (H) 10/02/2020   IRON 31 (L) 10/02/2020   TIBC 241 10/02/2020   UIBC 211 10/02/2020   IRONPCTSAT 13 (L) 10/02/2020   Lab Results  Component Value Date   RBC 4.68 05/25/2023   No results found for: "KPAFRELGTCHN", "LAMBDASER", "KAPLAMBRATIO" No results found for: "IGGSERUM", "IGA", "IGMSERUM" No results found for: "TOTALPROTELP", "ALBUMINELP", "A1GS", "A2GS", "BETS", "BETA2SER", "GAMS", "MSPIKE", "SPEI"   Chemistry      Component Value Date/Time   NA 139 05/25/2023 1215   NA 140 01/22/2022 0849   K 3.5 05/25/2023 1215   CL 106 05/25/2023 1215   CO2 27 05/25/2023 1215   BUN 14 05/25/2023 1215   BUN 11 01/22/2022 0849   CREATININE 0.62 05/25/2023 1215   CREATININE 0.58 (L) 08/20/2021 1541      Component Value Date/Time   CALCIUM 10.6 (H) 05/25/2023 1215   ALKPHOS 50 05/25/2023 1215   AST 11 (L) 05/25/2023 1215   ALT 15 05/25/2023 1215   BILITOT 0.4 05/25/2023 1215        Impression and Plan: Ms. Lazaro Arms is a very pleasant 78 yo female from [place] with hepatocellular carcinoma and Hepatitis C.  She actually has had treatment for the Hepatitis C.  I am sure that this is part of the reason why she has done so well with response.  Again, the hepatocellular carcinoma really is doing quite nicely.    We will continue her on treatment.  She has treatment every month.  In the future, if everything is holding steady, then we might build to move her appointments out to every 6 weeks.  I am happy that her quality life is doing well.  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  Her blood sugars are not doing bad at all right now.  Will plan to get her back in 1 month.    [name], MD 4/9/20251:05 PM

## 2023-05-25 NOTE — Patient Instructions (Signed)
 [facility] - A DEPT OF [facility]  Discharge Instructions: Thank you for choosing [facility] to provide your oncology and hematology care.   If you have a lab appointment with the Cancer Center, please go directly to the Cancer Center and check in at the registration area.  Wear comfortable clothing and clothing appropriate for easy access to any Portacath or PICC line.   We strive to give you quality time with your provider. You may need to reschedule your appointment if you arrive late (15 or more minutes).  Arriving late affects you and other patients whose appointments are after yours.  Also, if you miss three or more appointments without notifying the office, you may be dismissed from the clinic at the provider's discretion.      For prescription refill requests, have your pharmacy contact our office and allow 72 hours for refills to be completed.    Today you received the following chemotherapy and/or immunotherapy agents opdivo      To help prevent nausea and vomiting after your treatment, we encourage you to take your nausea medication as directed.  BELOW ARE SYMPTOMS THAT SHOULD BE REPORTED IMMEDIATELY: *FEVER GREATER THAN 100.4 F (38 C) OR HIGHER *CHILLS OR SWEATING *NAUSEA AND VOMITING THAT IS NOT CONTROLLED WITH YOUR NAUSEA MEDICATION *UNUSUAL SHORTNESS OF BREATH *UNUSUAL BRUISING OR BLEEDING *URINARY PROBLEMS (pain or burning when urinating, or frequent urination) *BOWEL PROBLEMS (unusual diarrhea, constipation, pain near the anus) TENDERNESS IN MOUTH AND THROAT WITH OR WITHOUT PRESENCE OF ULCERS (sore throat, sores in mouth, or a toothache) UNUSUAL RASH, SWELLING OR PAIN  UNUSUAL VAGINAL DISCHARGE OR ITCHING   Items with * indicate a potential emergency and should be followed up as soon as possible or go to the Emergency Department if any problems should occur.  Please show the CHEMOTHERAPY ALERT CARD or IMMUNOTHERAPY ALERT  CARD at check-in to the Emergency Department and triage nurse. Should you have questions after your visit or need to cancel or reschedule your appointment, please contact [facility] - A DEPT OF [facility]  [phone] and follow the prompts.  Office hours are 8:00 a.m. to 4:30 p.m. Monday - Friday. Please note that voicemails left after 4:00 p.m. may not be returned until the following business day.  We are closed weekends and major holidays. You have access to a nurse at all times for urgent questions. Please call the main number to the clinic [phone] and follow the prompts.  For any non-urgent questions, you may also contact your provider using MyChart. We now offer e-Visits for anyone 24 and older to request care online for non-urgent symptoms. For details visit [email or web address].   Also download the MyChart app! Go to the app store, search "MyChart", open the app, select [place], and log in with your MyChart username and password.

## 2023-05-25 NOTE — Patient Instructions (Signed)

## 2023-05-26 LAB — AFP TUMOR MARKER: AFP, Serum, Tumor Marker: 5.9 ng/mL (ref 0.0–9.2)

## 2023-05-27 ENCOUNTER — Other Ambulatory Visit: Payer: Self-pay

## 2023-05-28 ENCOUNTER — Other Ambulatory Visit: Payer: Self-pay

## 2023-06-02 ENCOUNTER — Other Ambulatory Visit: Payer: Self-pay | Admitting: Internal Medicine

## 2023-06-02 ENCOUNTER — Other Ambulatory Visit: Payer: Self-pay

## 2023-06-02 ENCOUNTER — Encounter: Payer: Self-pay | Admitting: Hematology & Oncology

## 2023-06-02 ENCOUNTER — Ambulatory Visit: Payer: Self-pay | Attending: Internal Medicine | Admitting: Internal Medicine

## 2023-06-02 ENCOUNTER — Encounter: Payer: Self-pay | Admitting: Internal Medicine

## 2023-06-02 VITALS — BP 132/73 | HR 89 | Temp 98.1°F | Ht 63.0 in | Wt 176.0 lb

## 2023-06-02 DIAGNOSIS — L280 Lichen simplex chronicus: Secondary | ICD-10-CM

## 2023-06-02 DIAGNOSIS — E1159 Type 2 diabetes mellitus with other circulatory complications: Secondary | ICD-10-CM

## 2023-06-02 DIAGNOSIS — E1142 Type 2 diabetes mellitus with diabetic polyneuropathy: Secondary | ICD-10-CM

## 2023-06-02 DIAGNOSIS — I10 Essential (primary) hypertension: Secondary | ICD-10-CM

## 2023-06-02 DIAGNOSIS — C220 Liver cell carcinoma: Secondary | ICD-10-CM

## 2023-06-02 DIAGNOSIS — Z794 Long term (current) use of insulin: Secondary | ICD-10-CM

## 2023-06-02 DIAGNOSIS — E039 Hypothyroidism, unspecified: Secondary | ICD-10-CM

## 2023-06-02 LAB — POCT GLYCOSYLATED HEMOGLOBIN (HGB A1C): HbA1c, POC (controlled diabetic range): 7 % (ref 0.0–7.0)

## 2023-06-02 LAB — GLUCOSE, POCT (MANUAL RESULT ENTRY): POC Glucose: 157 mg/dL — AB (ref 70–99)

## 2023-06-02 MED ORDER — HYDRALAZINE HCL 10 MG PO TABS
20.0000 mg | ORAL_TABLET | Freq: Two times a day (BID) | ORAL | 3 refills | Status: DC
  Filled 2023-06-02 – 2023-06-22 (×3): qty 360, 90d supply, fill #0
  Filled 2023-07-19: qty 120, 30d supply, fill #0
  Filled 2023-08-16: qty 120, 30d supply, fill #1
  Filled 2023-09-21: qty 120, 30d supply, fill #2

## 2023-06-02 MED ORDER — NIFEDIPINE ER OSMOTIC RELEASE 30 MG PO TB24
30.0000 mg | ORAL_TABLET | Freq: Two times a day (BID) | ORAL | 3 refills | Status: DC
  Filled 2023-06-02 (×2): qty 180, 90d supply, fill #0
  Filled 2023-06-06: qty 60, 30d supply, fill #0
  Filled 2023-06-22: qty 60, 30d supply, fill #1
  Filled 2023-07-31: qty 60, 30d supply, fill #2
  Filled 2023-08-31: qty 60, 30d supply, fill #3
  Filled 2023-10-01 – 2023-10-03 (×2): qty 60, 30d supply, fill #4
  Filled 2023-10-31: qty 60, 30d supply, fill #5
  Filled 2023-11-28 (×2): qty 60, 30d supply, fill #6
  Filled 2024-01-02: qty 60, 30d supply, fill #7
  Filled 2024-01-31: qty 120, 60d supply, fill #8

## 2023-06-02 NOTE — Patient Instructions (Signed)
 VISIT SUMMARY:  You came in today for a follow-up visit. We discussed your ongoing conditions, including your eye surgery recovery, lichen dermatitis, diabetes, and cancer. Your prosthetic eye is comfortable, and your dermatitis is improving. We also reviewed your thyroid levels, which are stable on your current medication.  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 You are currently on 10 mg of Prednisone and being followed by dermatology.  -TYPE 2 DIABETES MELLITUS: Type 2 Diabetes Mellitus is a condition where your body does not use insulin properly. Your A1c has improved to 7.0%, and your blood glucose is generally well-controlled. Continue taking Lantus insulin at 14 units daily and Humalog insulin at 12 units with meals, adjusting per sliding scale. Check your blood glucose levels three times daily and keep a log for your endocrinology follow-up next week.  -HYPERTENSION: Hypertension is high blood pressure. Your blood pressure is occasionally above target, so we will increase your hydralazine to 20 mg twice daily. Continue taking losartan 100 mg daily and nifedipine 30 mg twice daily.  -LIVER CIRRHOSIS: Liver Cirrhosis is a condition where the liver is scarred and its function is impaired. Your condition is well-managed with immunotherapy, and recent MRI shows reduced cancer cells. Continue immunotherapy every four weeks and monitor liver function and disease progression.  -HYPOTHYROIDISM: Hypothyroidism is when your thyroid does not produce enough hormones. Your thyroid function tests are normal, so continue taking levothyroxine 75 mcg daily.  -GENERAL HEALTH MAINTENANCE: Your weight gain is likely due to prednisone. To manage your weight and improve your health, follow a diet low in carbohydrates and high in vegetables, and aim for at least 20-30 minutes of walking three to four times a week.  INSTRUCTIONS:  Please schedule a  follow-up appointment in four months. Additionally, we need to collect a urine sample to check for proteinuria due to diabetes.

## 2023-06-02 NOTE — Progress Notes (Signed)
 Patient ID: [name] [name], female    DOB: November 25, 1945  MRN: [id number]  CC: Diabetes (DM f/u. Med refill. /)   Subjective: [name] [name] is a 78 y.o. female who presents for chronic ds management.  Daughter is with her. 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    Discussed the use of AI scribe software for clinical note transcription with the patient, who gave verbal consent to proceed.  History of Present Illness   The patient, with a history of eye surgery, lichen dermatitis, diabetes, and cancer, presents for a follow-up visit. She has been fitted with a prosthetic eye post-surgery and reports comfort with the prosthetic.   Lichenoid dermatitis: She had flareup several months ago when she was placed on prednisone and then taken off.  Her oncologist restarted prednisone 40 mg about 2 months ago and referred her back to dermatology.  The dermatologist has been tapering the dose.  She is now at 10 mg daily.  Dermatology also has been giving some local steroid injections to the larger lesions.  Things are improving with no new lesions.   Hypothyroidism: She remains on levothyroxine 75 mcg daily.  Recent TSH level done about a week ago was 2.13 which is normal.  DM: Results for orders placed or performed in visit on 06/02/23  POCT glucose (manual entry)   Collection Time: 06/02/23  3:21 PM  Result Value Ref Range   POC Glucose 157 (A) 70 - 99 mg/dl  POCT glycosylated hemoglobin (Hb A1C)   Collection Time: 06/02/23  3:42 PM  Result Value Ref Range   Hemoglobin A1C     HbA1c POC (<> result, manual entry)     HbA1c, POC (prediabetic range)      HbA1c, POC (controlled diabetic range) 7.0 0.0 - 7.0 %  Checks blood sugars 3 times a day before meals.  She has a glucometer with her.  Blood sugars before breakfast range 170 150s, before lunch 190 181 and before dinner 108-141.  She is on Lantus insulin 14 units and Humalog 12 units with meals with sliding scale.  Reports having to use more Humalog at times when she was on higher dose of prednisone.  She has gained 11 pounds since I last saw her 4 months ago.  Some of this weight gain is likely due to the prednisone.  Reports that her eating habits are good.  Eats lots of veggies and not a lot of carbs.  Not getting in as much exercise.  On average she tells me she is getting in about 1000-1200 steps a day.  HTN: Currently on hydralazine 10 mg twice a day, Cozaar 100 mg daily and Procardia 30 mg twice a day.  Reports compliance with medications and low-salt diet.  Blood pressure range at home with systolic blood pressure majority of the times in the 140s with normal diastolic.  Hepatocellular CA: Still receiving treatment through Dr. Myna Hidalgo. 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 08/21/2022   Elevated LFTs 08/21/2022   Hyponatremia 08/20/2022   Positive for macroalbuminuria 06/26/2022   Type 2 diabetes mellitus without complication, with long-term current  use of insulin (HCC) 06/24/2022   Type 2 diabetes mellitus with hyperglycemia, with long-term current use of insulin (HCC) 01/18/2022   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   Hypercalcemia 09/22/2020   Chronic hepatitis C with hepatic coma (HCC) 09/22/2020   Goals of care, counseling/discussion 09/01/2020   Hepatocellular carcinoma (HCC) 08/14/2020   Hyperglycemia 08/10/2020   Pneumoperitoneum 08/09/2020   History of colorectal cancer 08/09/2020   Liver mass, left lobe 08/09/2020   Insulin-requiring or dependent type II diabetes mellitus (HCC)  08/09/2020   Hypertension associated with diabetes (HCC) 08/09/2020   Constipation, chronic 08/09/2020   Perforated gastric ulcer s/p lap omental Graham patch 08/09/2020 08/09/2020     Current Outpatient Medications on File Prior to Visit  Medication Sig Dispense Refill   brimonidine (ALPHAGAN) 0.15 % ophthalmic solution Place into the left eye 2 (two) times daily.     brimonidine (ALPHAGAN) 0.15 % ophthalmic solution Place 1 drop into the left eye 2 (two) times daily. 15 mL 11   gabapentin (NEURONTIN) 300 MG capsule Take 1 capsule (300 mg total) by mouth 3 (three) times daily. 90 capsule 4   glucose blood (TRUE METRIX BLOOD GLUCOSE TEST) test strip Use as instructed 100 each 12   insulin glargine (LANTUS) 100 UNIT/ML Solostar Pen Inject 14 Units into the skin daily. 15 mL 6   insulin lispro (HUMALOG KWIKPEN) 200 UNIT/ML KwikPen Max daily dose 100 units. (Patient taking differently: Inject 12 Units into the skin See admin instructions. Inject 12 units into the skin three times a day before meals, plus a sliding scale for a BGL above 155. Taper as directed by MD [name]. Max daily dose is 100 units. BGL <155 = give nothing; 156-180 = 1 unit; 181-205 = 2 units; 206-230 = 3 units; 231-255 = 4 units; 256-280 = 5 units; 281-305 = 6 units; 306-330 = 7 units; 331-355 = 8 units; 256-380 = 9 units. Each time Prednisone is decreased, please decrease Humalog by 4 units with each meal.) 60 mL 0   Insulin Pen Needle 32G X 4 MM MISC Use with insulins daily. 400 each 3   levothyroxine (SYNTHROID) 75 MCG tablet Take 1 tablet (75 mcg total) by mouth daily before breakfast. 30 tablet 3   lidocaine-prilocaine (EMLA) cream Apply 1 Application topically as needed (for port access).     losartan (COZAAR) 100 MG tablet Take 1 tablet (100 mg total) by mouth at bedtime. 90 tablet 1   nivolumab (OPDIVO) 100 MG/10ML SOLN chemo injection Inject into the vein.     pantoprazole (PROTONIX) 40 MG tablet Take 1 tablet (40 mg  total) by mouth 2 (two) times daily. 180 tablet 1   polyethylene glycol powder (GLYCOLAX/MIRALAX) 17 GM/SCOOP powder Take 17 g by mouth daily as needed for mild constipation.     predniSONE (DELTASONE) 10 MG tablet Take 1 tablet (10 mg total) by mouth in the morning. 45 tablet 1   diphenhydramine-acetaminophen (TYLENOL PM) 25-500 MG TABS tablet Take by mouth. (Patient not taking: Reported on 04/27/2023)     Current Facility-Administered Medications on File Prior to Visit  Medication Dose Route Frequency Provider Last Rate Last Admin   sodium chloride flush (NS) 0.9 % injection 10 mL  10 mL Intravenous PRN Tish Forge, NP   10 mL at 12/18/20 1009    Allergies  Allergen Reactions   Voriconazole Shortness Of Breath and Swelling    Patient reported that tablets sent her to the ER   Bactrim [Sulfamethoxazole-Trimethoprim] Swelling and  Other (See Comments)    Site of swelling not recalled   Clarithromycin Swelling, Rash and Other (See Comments)    Daughter is unsure if Clarithromycin or Amoxicillin caused rash and swelling.  Patient states no allergy to amoxicillan - has taken this recently with no issues. (12/02/21)    Social History   Socioeconomic History   Marital status: Widowed    Spouse name: Not on file   Number of children: 5   Years of education: 14   Highest education level: Associate degree: occupational, Scientist, product/process development, or vocational program  Occupational History   Not on file  Tobacco Use   Smoking status: Never   Smokeless tobacco: Never  Vaping Use   Vaping status: Never Used  Substance and Sexual Activity   Alcohol use: Not Currently   Drug use: Never   Sexual activity: Not Currently  Other Topics Concern   Not on file  Social History Narrative   Originally from [place].  Speaks [place] and [place]   Social Drivers of Corporate investment banker Strain: Not on file  Food Insecurity: No Food Insecurity (08/21/2022)   Hunger Vital Sign    Worried About Running  Out of Food in the Last Year: Never true    Ran Out of Food in the Last Year: Never true  Transportation Needs: No Transportation Needs (08/21/2022)   PRAPARE - Administrator, Civil Service (Medical): No    Lack of Transportation (Non-Medical): No  Physical Activity: Not on file  Stress: Not on file  Social Connections: Not on file  Intimate Partner Violence: Not At Risk (08/21/2022)   Humiliation, Afraid, Rape, and Kick questionnaire    Fear of Current or Ex-Partner: No    Emotionally Abused: No    Physically Abused: No    Sexually Abused: No    Family History  Problem Relation Age of Onset   Cancer Father        type unknown, was on the leg   Other Daughter        Acoustic neuroma   Breast cancer Neg Hx    Colon cancer Neg Hx    Esophageal cancer Neg Hx    Rectal cancer Neg Hx    Stomach cancer Neg Hx     Past Surgical History:  Procedure Laterality Date   COLON RESECTION  2005   In [place] - ?colon cancer   IR IMAGING GUIDED PORT INSERTION  09/18/2020   LAPAROSCOPIC GASTRIC RESECTION N/A 08/09/2020   Procedure: LAPAROSCOPIC EXPLORATION OF ABDOMEN, PERFORATED ULCER REPAIR, LIVER BIOPSY;  Surgeon: Candyce Champagne, MD;  Location: [facility];  Service: General;  Laterality: N/A;    ROS: Review of Systems Negative except as stated above  PHYSICAL EXAM: BP 132/73 (BP Location: Left Arm, Patient Position: Sitting, Cuff Size: Normal)   Pulse 89   Temp 98.1 F (36.7 C) (Oral)   Ht 5\' 3"  (1.6 m)   Wt 176 lb (79.8 kg)   SpO2 98%   BMI 31.18 kg/m   Wt Readings from Last 3 Encounters:  06/02/23 176 lb (79.8 kg)  05/25/23 175 lb (79.4 kg)  04/27/23 171 lb (77.6 kg)    Physical Exam  General appearance - alert, well appearing, and in no distress Mental status - normal mood, behavior, speech, dress, motor activity, and thought processes Neck - supple, no significant adenopathy Chest - clear to auscultation, no wheezes, rales or rhonchi, symmetric air entry Heart -  normal rate, regular rhythm, normal  S1, S2, no murmurs, rubs, clicks or gallops Extremities - peripheral pulses normal, no pedal edema, no clubbing or cyanosis Skin -patient has some smaller dried, raised hyperpigmented spots on both lower extremities right greater than left.  No draining lesions seen.      Latest Ref Rng & Units 05/25/2023   12:15 PM 04/27/2023   12:10 PM 03/30/2023    8:10 AM  CMP  Glucose 70 - 99 mg/dL 161  096  045   BUN 8 - 23 mg/dL 14  17  13    Creatinine 0.44 - 1.00 mg/dL 4.09  8.11  9.14   Sodium 135 - 145 mmol/L 139  139  140   Potassium 3.5 - 5.1 mmol/L 3.5  3.7  3.8   Chloride 98 - 111 mmol/L 106  108  106   CO2 22 - 32 mmol/L 27  24  27    Calcium 8.9 - 10.3 mg/dL 78.2  9.6  95.6   Total Protein 6.5 - 8.1 g/dL 6.7  6.6  6.6   Total Bilirubin 0.0 - 1.2 mg/dL 0.4  0.4  0.6   Alkaline Phos 38 - 126 U/L 50  54  49   AST 15 - 41 U/L 11  13  17    ALT 0 - 44 U/L 15  19  30     Lipid Panel  No results found for: "CHOL", "TRIG", "HDL", "CHOLHDL", "VLDL", "LDLCALC", "LDLDIRECT"  CBC    Component Value Date/Time   WBC 4.9 05/25/2023 1215   WBC 5.9 08/22/2022 0345   RBC 4.68 05/25/2023 1215   HGB 12.0 05/25/2023 1215   HCT 37.8 05/25/2023 1215   PLT 192 05/25/2023 1215   MCV 80.8 05/25/2023 1215   MCH 25.6 (L) 05/25/2023 1215   MCHC 31.7 05/25/2023 1215   RDW 17.9 (H) 05/25/2023 1215   LYMPHSABS 1.1 05/25/2023 1215   MONOABS 0.3 05/25/2023 1215   EOSABS 0.0 05/25/2023 1215   BASOSABS 0.0 05/25/2023 1215    ASSESSMENT AND PLAN: 1. Type 2 diabetes mellitus with peripheral neuropathy (HCC) (Primary) A1c close to goal. She will continue Lantus insulin 14 units daily and Humalog 12 units with meals plus sliding scale.  Keep upcoming appointment with her endocrinologist Dr. Brooks Sailors next week.  I have given her some log sheet for her to keep a log of her blood sugar readings and take log with her to her appointment. Encouraged her to continue healthy eating  habits. Encouraged her to try to move more now that the weather is nice.  She will try to get out and walk about 3 times a week for 20 to 30 minutes. - POCT glycosylated hemoglobin (Hb A1C) - POCT glucose (manual entry) - Microalbumin / creatinine urine ratio  2. Insulin long-term use (HCC) See #1 above.  3. Hypertension associated with diabetes (HCC) Close to goal.  However patient reports higher readings at home.  We will have her increase hydralazine to 20 mg twice a day.  Continue Cozaar 100 mg daily and Procardia 30 mg twice a day. - NIFEdipine (PROCARDIA-XL/NIFEDICAL-XL) 30 MG 24 hr tablet; Take 1 tablet (30 mg total) by mouth in the morning and at bedtime.  Dispense: 180 tablet; Refill: 3 - hydrALAZINE (APRESOLINE) 10 MG tablet; Take 2 tablets (20 mg total) by mouth in the morning and at bedtime.  Dispense: 360 tablet; Refill: 3  4. Acquired hypothyroidism Controlled on current dose of levothyroxine 75 mcg daily.  5. Lichenoid dermatitis Improving.  Followed by dermatology.  She may end up needing to stay on low-dose prednisone long-term.  6. Hepatocellular carcinoma (HCC) Being managed by Dr. Maria Shiner.    Patient was given the opportunity to ask questions.  Patient verbalized understanding of the plan and was able to repeat key elements of the plan.   This documentation was completed using Paediatric nurse.  Any transcriptional errors are unintentional.  Orders Placed This Encounter  Procedures   Microalbumin / creatinine urine ratio   POCT glycosylated hemoglobin (Hb A1C)   POCT glucose (manual entry)     Requested Prescriptions   Signed Prescriptions Disp Refills   NIFEdipine (PROCARDIA-XL/NIFEDICAL-XL) 30 MG 24 hr tablet 180 tablet 3    Sig: Take 1 tablet (30 mg total) by mouth in the morning and at bedtime.   hydrALAZINE (APRESOLINE) 10 MG tablet 360 tablet 3    Sig: Take 2 tablets (20 mg total) by mouth in the morning and at bedtime.     Return in about 4 months (around 10/02/2023).  Concetta Dee, MD, FACP

## 2023-06-03 ENCOUNTER — Other Ambulatory Visit: Payer: Self-pay

## 2023-06-04 LAB — MICROALBUMIN / CREATININE URINE RATIO
Creatinine, Urine: 70.7 mg/dL
Microalb/Creat Ratio: 823 mg/g{creat} — ABNORMAL HIGH (ref 0–29)
Microalbumin, Urine: 581.6 ug/mL

## 2023-06-05 ENCOUNTER — Encounter: Payer: Self-pay | Admitting: Internal Medicine

## 2023-06-06 ENCOUNTER — Other Ambulatory Visit: Payer: Self-pay | Admitting: Internal Medicine

## 2023-06-06 ENCOUNTER — Other Ambulatory Visit: Payer: Self-pay

## 2023-06-06 ENCOUNTER — Other Ambulatory Visit: Payer: Self-pay | Admitting: Pharmacist

## 2023-06-06 ENCOUNTER — Encounter: Payer: Self-pay | Admitting: Hematology & Oncology

## 2023-06-06 MED ORDER — HUMALOG KWIKPEN 200 UNIT/ML ~~LOC~~ SOPN
12.0000 [IU] | PEN_INJECTOR | SUBCUTANEOUS | 1 refills | Status: DC
  Filled 2023-06-06: qty 24, 48d supply, fill #0
  Filled 2023-09-01: qty 12, 24d supply, fill #1

## 2023-06-06 MED ORDER — DAPAGLIFLOZIN PROPANEDIOL 5 MG PO TABS
5.0000 mg | ORAL_TABLET | Freq: Every day | ORAL | 3 refills | Status: DC
  Filled 2023-06-06: qty 30, 30d supply, fill #0
  Filled 2023-06-22: qty 7, 7d supply, fill #1

## 2023-06-07 ENCOUNTER — Other Ambulatory Visit: Payer: Self-pay | Admitting: Internal Medicine

## 2023-06-08 ENCOUNTER — Encounter: Payer: Self-pay | Admitting: Internal Medicine

## 2023-06-08 ENCOUNTER — Ambulatory Visit (INDEPENDENT_AMBULATORY_CARE_PROVIDER_SITE_OTHER): Payer: Self-pay | Admitting: Internal Medicine

## 2023-06-08 VITALS — BP 126/82 | HR 89 | Ht 63.0 in | Wt 177.0 lb

## 2023-06-08 DIAGNOSIS — R809 Proteinuria, unspecified: Secondary | ICD-10-CM

## 2023-06-08 DIAGNOSIS — E1129 Type 2 diabetes mellitus with other diabetic kidney complication: Secondary | ICD-10-CM

## 2023-06-08 DIAGNOSIS — Z794 Long term (current) use of insulin: Secondary | ICD-10-CM

## 2023-06-08 NOTE — Progress Notes (Unsigned)
 Name: [name] [name]  MRN/ DOB: [id number], [birth year]   Age/ Sex: 78 y.o., female    PCP: Lawrance Presume, MD   Reason for Endocrinology Evaluation: Type 2 Diabetes Mellitus     Date of Initial Endocrinology Visit: 01/18/2022    PATIENT IDENTIFIER: [name] [name] is a 78 y.o. female with a past medical history of Hx of perforated gastric ulcer, chronic hepatitis C, HTN, hepatocellular carcinoma, T2DM. The patient presented for initial endocrinology clinic visit on 01/18/2022 for consultative assistance with her diabetes management.    HPI: [name] [name] is here with her daughter    Diagnosed with DM 2005 Prior Medications tried/Intolerance: was on oral agents years ago , bit does not recall  Hemoglobin A1c has ranged from 7.3% in 06/2021, peaking at 7.5% in 2023.  Of note, the pt follows with oncology ( Dr. Maria Shiner) for hepatocellular carcinoma, currently on Nivolumab  infusions   She is on Prednisone   through dermatology for lichenoid dermatitis   ON her initial visit to our clinic her A1c 6.7% but she was on Humalog  Mix Plus regular humalog  and was guesstimating her  doses. We switched  to basal/prandial insulin     She was started on Farxiga  through her PCPs office due to microalbuminuria  SUBJECTIVE:   During the last visit (12/01/2022): A1c 6.6%     Today (06/08/23): Lindsey [name] [name] is here for a follow up on diabetes management. She is accompanied by her daughter. She checks her blood sugars 3-4 times daily. The patient has  had hypoglycemic episodes since the last clinic visit, which typically occur  in the evening    Patient follows with oncology for hepatocellular carcinoma, hepatitis C.  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  She had abdominal pain, but this has resolved with miralax   Continued with  chronic constipation- on miralax    Has chrinic  LE tingling    HOME DIABETES REGIMEN: Farxiga  5 mg daily  Basaglar  14 units daily Humalog  12 units TIQAC  Correction factor: Humalog  (BG -130/25)    Statin: No ACE-I/ARB: Yes    METER DOWNLOAD SUMMARY:  90 day average 139 mg/dl  68- 595 mg/dL     DIABETIC COMPLICATIONS: Microvascular complications:   Denies: CKD Last eye exam: Completed 08/2022  Macrovascular complications:   Denies: CAD, PVD, CVA   PAST HISTORY: Past Medical History:  Past Medical History:  Diagnosis Date   Colon cancer (HCC)    Diabetes mellitus without complication (HCC)    Glaucoma    Goals of care, counseling/discussion 09/01/2020   Hepatitis C    Hepatocellular carcinoma (HCC)    Hypertension    Past Surgical History:  Past Surgical History:  Procedure Laterality Date   COLON RESECTION  2005   In [place] - ?colon cancer   IR IMAGING GUIDED PORT INSERTION  09/18/2020   LAPAROSCOPIC GASTRIC RESECTION N/A 08/09/2020   Procedure: LAPAROSCOPIC EXPLORATION OF ABDOMEN, PERFORATED ULCER REPAIR, LIVER BIOPSY;  Surgeon: Candyce Champagne, MD;  Location: [facility];  Service: General;  Laterality: N/A;    Social History:  reports that she has never smoked. She has never used smokeless tobacco. She reports that she does not  currently use alcohol . She reports that she does not use drugs. Family History:  Family History  Problem Relation Age of Onset   Cancer Father        type unknown, was on the leg   Other Daughter        Acoustic neuroma   Breast cancer Neg Hx    Colon cancer Neg Hx    Esophageal cancer Neg Hx    Rectal cancer Neg Hx    Stomach cancer Neg Hx      HOME MEDICATIONS: Allergies as of 06/08/2023       Reactions   Voriconazole  Shortness Of Breath, Swelling   Patient reported that  tablets sent her to the ER   Bactrim [sulfamethoxazole-trimethoprim] Swelling, Other (See Comments)   Site of swelling not recalled   Clarithromycin  Swelling, Rash, Other (See Comments)   Daughter is unsure if Clarithromycin  or Amoxicillin  caused rash and swelling. Patient states no allergy to amoxicillan - has taken this recently with no issues. (12/02/21)        Medication List        Accurate as of June 08, 2023  2:42 PM. If you have any questions, ask your nurse or doctor.          Basaglar  KwikPen 100 UNIT/ML Inject 14 Units into the skin daily.   brimonidine  0.15 % ophthalmic solution Commonly known as: ALPHAGAN  Place into the left eye 2 (two) times daily.   brimonidine  0.15 % ophthalmic solution Commonly known as: ALPHAGAN  Place 1 drop into the left eye 2 (two) times daily.   clobetasol  ointment 0.05 % Commonly known as: TEMOVATE  Apply topically.   diphenhydramine -acetaminophen  25-500 MG Tabs tablet Commonly known as: TYLENOL  PM Take by mouth.   Farxiga  5 MG Tabs tablet Generic drug: dapagliflozin  propanediol Take 1 tablet (5 mg total) by mouth daily before breakfast.   gabapentin  300 MG capsule Commonly known as: NEURONTIN  Take 1 capsule (300 mg total) by mouth 3 (three) times daily.   HumaLOG  KwikPen 200 UNIT/ML KwikPen Generic drug: insulin  lispro Inject 12 Units into the skin See admin instructions. Inject 12 units into the skin three times a day before meals, plus a sliding scale for a BGL above 155. Taper as directed by MD Caeley Dohrmann. Max daily dose is 100 units. BGL <155 = give nothing; 156-180 = 1 unit; 181-205 = 2 units; 206-230 = 3 units; 231-255 = 4 units; 256-280 = 5 units; 281-305 = 6 units; 306-330 = 7 units; 331-355 = 8 units; 256-380 = 9 units. Each time Prednisone  is decreased, please decrease Humalog  by 4 units with each meal.   hydrALAZINE  10 MG tablet Commonly known as: APRESOLINE  Take 2 tablets (20 mg total) by mouth in the morning and  at bedtime.   levothyroxine  75 MCG tablet Commonly known as: Synthroid  Take 1 tablet (75 mcg total) by mouth daily before breakfast.   lidocaine -prilocaine  cream Commonly known as: EMLA  Apply 1 Application topically as needed (for port access).   losartan  100 MG tablet Commonly known as: COZAAR  Take 1 tablet (100 mg total) by mouth at bedtime.   NIFEdipine  30 MG 24 hr tablet Commonly known as: PROCARDIA -XL/NIFEDICAL-XL Take 1 tablet (30 mg total) by mouth in the morning and at bedtime.   Opdivo  100 MG/10ML Soln chemo injection Generic drug: nivolumab  Inject into the vein.   pantoprazole  40 MG tablet Commonly known as: Protonix  Take 1 tablet (40 mg total) by mouth 2 (two) times daily.   polyethylene glycol  powder 17 GM/SCOOP powder Commonly known as: GLYCOLAX /MIRALAX  Take 17 g by mouth daily as needed for mild constipation.   predniSONE  10 MG tablet Commonly known as: DELTASONE  Take 1 tablet (10 mg total) by mouth in the morning.   True Metrix Blood Glucose Test test strip Generic drug: glucose blood Use as instructed   TRUEplus 5-Bevel Pen Needles 32G X 4 MM Misc Generic drug: Insulin  Pen Needle Use with insulins daily.         ALLERGIES: Allergies  Allergen Reactions   Voriconazole  Shortness Of Breath and Swelling    Patient reported that tablets sent her to the ER   Bactrim [Sulfamethoxazole-Trimethoprim] Swelling and Other (See Comments)    Site of swelling not recalled   Clarithromycin  Swelling, Rash and Other (See Comments)    Daughter is unsure if Clarithromycin  or Amoxicillin  caused rash and swelling.  Patient states no allergy to amoxicillan - has taken this recently with no issues. (12/02/21)     REVIEW OF SYSTEMS: A comprehensive ROS was conducted with the patient and is negative except as per HPI     OBJECTIVE:   VITAL SIGNS: BP 126/82 (BP Location: Left Arm, Patient Position: Sitting, Cuff Size: Normal)   Pulse 89   Ht 5\' 3"  (1.6 m)    Wt 177 lb (80.3 kg)   SpO2 99%   BMI 31.35 kg/m     PHYSICAL EXAM:  General: Pt appears well and is in NAD  Neck: General: Supple without adenopathy or carotid bruits. Thyroid : Thyroid  size normal.  No goiter or nodules appreciated.   Lungs: Clear with good BS bilat with no rales, rhonchi, or wheezes  Heart: RRR   Extremities:  Lower extremities - No pretibial edema  Neuro: MS is good with appropriate affect, pt is alert and Ox3   DM Foot Exam 06/23/2022  The skin of the feet is intact without sores or ulcerations. The pedal pulses are 2+ on right and 2+ on left. The sensation is intact to a screening 5.07, 10 gram monofilament bilaterally    DATA REVIEWED:  Lab Results  Component Value Date   HGBA1C 7.0 06/02/2023   HGBA1C 7.3 (A) 01/27/2023   HGBA1C 6.3 10/28/2022    Latest Reference Range & Units 05/25/23 12:15  Sodium 135 - 145 mmol/L 139  Potassium 3.5 - 5.1 mmol/L 3.5  Chloride 98 - 111 mmol/L 106  CO2 22 - 32 mmol/L 27  Glucose 70 - 99 mg/dL 191 (H)  BUN 8 - 23 mg/dL 14  Creatinine 4.78 - 2.95 mg/dL 6.21  Calcium 8.9 - 30.8 mg/dL 65.7 (H)  Anion gap 5 - 15  6  Alkaline Phosphatase 38 - 126 U/L 50  Albumin  3.5 - 5.0 g/dL 4.2  AST 15 - 41 U/L 11 (L)  ALT 0 - 44 U/L 15  Total Protein 6.5 - 8.1 g/dL 6.7  Total Bilirubin 0.0 - 1.2 mg/dL 0.4  GFR, Est Non African American >60 mL/min >60    Latest Reference Range & Units 05/25/23 12:15  TSH 0.350 - 4.500 uIU/mL 2.130    Latest Reference Range & Units 06/02/23 16:46  Microalbumin, Urine Not Estab. ug/mL 581.6  MICROALB/CREAT RATIO 0 - 29 mg/g creat 823 (H)  Creatinine, Urine Not Estab. mg/dL 84.6  (H): Data is abnormally high  ASSESSMENT / PLAN / RECOMMENDATIONS:   1) Type 2 Diabetes Mellitus, Optimally controlled, With Microalbuminuria complications - Most recent A1c of 7.0 %. Goal A1c < 7.0 %.     -  A1c at goal -She was recently started on Farxiga  through her PCPs office due to microalbuminuria, her BGs  have been trending down, will decrease insulin  preemptively to prevent hypoglycemia -Patient has no health insurance and I am not aware of any patient assistance programs for CGM technology   MEDICATIONS: - Decrease Lantus  12 units daily -Change Humalog  10 units TIDQAC -Continue Correction factor: Humalog  (BG -130/25)  EDUCATION / INSTRUCTIONS: BG monitoring instructions: Patient is instructed to check her blood sugars 3 times a day, before each meal . Call [facility] if: BG persistently < 70  I reviewed the Rule of 15 for the treatment of hypoglycemia in detail with the patient. Literature supplied.   2) Diabetic complications:  Eye: Does not have known diabetic retinopathy.  Neuro/ Feet: Does not have known diabetic peripheral neuropathy. Renal: Patient does not have known baseline CKD. She is  on an ACEI/ARB at present.    Follow-up in 4 months     I spent 25 minutes preparing to see the patient by review of recent labs, imaging and procedures, obtaining and reviewing separately obtained history, communicating with the patient ordering medications, tests or procedures, and documenting clinical information in the EHR including the differential Dx, treatment, and any further evaluation and other management    Signed electronically by: Natale Bail, MD  [facility]  [facility] [address] [place] [place], [place] [address] Phone: [phone] FAX: [phone]   CC: Lawrance Presume, MD [address] [place] [address] [place] [address] Phone: [phone]  Fax: [phone]    Return to Endocrinology clinic as below: Future Appointments  Date Time Provider Department Center  06/22/2023 12:00 PM [facility] LAB [facility] None  06/22/2023 12:15 PM [facility] INJ NURSE [facility] None  06/22/2023 12:30 PM Ennever, Sherryll Donald, MD [facility] None  06/22/2023 12:45 PM [facility] INFUSION [facility] None  07/20/2023  8:30 AM [facility] LAB [facility]  None  07/20/2023  8:45 AM [facility] INJ NURSE [facility] None  07/20/2023  9:00 AM Ennever, Sherryll Donald, MD [facility] None  07/20/2023  9:30 AM [facility] INFUSION [facility] None  09/26/2023 11:30 AM Majed Pellegrin, Julian Obey, MD [facility] None  10/03/2023  3:30 PM Lawrance Presume, MD [facility] None

## 2023-06-08 NOTE — Patient Instructions (Addendum)
 You may use Capsaicin cream for tingling of the feet at night, please wash hands thoroughly after use    Decrease  Lantus  12 units ONCE daily  Take Humalog  10 units with Breakfast, 10 units with Lunch and 10 units with Supper Humalog  correctional insulin : ADD extra units on insulin  to your meal-time Humalog   dose if your blood sugars are higher than 155. Use the scale below to help guide you:   Blood sugar before meal Number of units to inject  Less than 155 0 unit  156 -  180 1 units  181 -  205 2 units  206 -  230 3 units  231 -  255 4 units  256 -  280 5 units  281 -  305 6 units  306 -  330 7 units  331 -  355 8 units  356 - 380 9 units     HOW TO TREAT LOW BLOOD SUGARS (Blood sugar LESS THAN 70 MG/DL) Please follow the RULE OF 15 for the treatment of hypoglycemia treatment (when your (blood sugars are less than 70 mg/dL)   STEP 1: Take 15 grams of carbohydrates when your blood sugar is low, which includes:  3-4 GLUCOSE TABS  OR 3-4 OZ OF JUICE OR REGULAR SODA OR ONE TUBE OF GLUCOSE GEL    STEP 2: RECHECK blood sugar in 15 MINUTES STEP 3: If your blood sugar is still low at the 15 minute recheck --> then, go back to STEP 1 and treat AGAIN with another 15 grams of carbohydrates.

## 2023-06-09 ENCOUNTER — Other Ambulatory Visit: Payer: Self-pay

## 2023-06-09 DIAGNOSIS — R809 Proteinuria, unspecified: Secondary | ICD-10-CM | POA: Insufficient documentation

## 2023-06-09 MED ORDER — INSULIN GLARGINE 100 UNIT/ML SOLOSTAR PEN
12.0000 [IU] | PEN_INJECTOR | Freq: Every day | SUBCUTANEOUS | 6 refills | Status: DC
Start: 2023-06-09 — End: 2023-10-19
  Filled 2023-06-09: qty 15, 125d supply, fill #0
  Filled 2023-07-19: qty 3, 25d supply, fill #0
  Filled 2023-08-15 (×2): qty 3, 25d supply, fill #1
  Filled 2023-09-11 – 2023-09-12 (×2): qty 3, 25d supply, fill #2
  Filled 2023-10-03: qty 3, 25d supply, fill #3

## 2023-06-13 ENCOUNTER — Other Ambulatory Visit: Payer: Self-pay

## 2023-06-15 ENCOUNTER — Other Ambulatory Visit: Payer: Self-pay | Admitting: Internal Medicine

## 2023-06-15 ENCOUNTER — Other Ambulatory Visit: Payer: Self-pay

## 2023-06-15 ENCOUNTER — Encounter: Payer: Self-pay | Admitting: Hematology & Oncology

## 2023-06-16 ENCOUNTER — Encounter: Payer: Self-pay | Admitting: Hematology & Oncology

## 2023-06-16 ENCOUNTER — Other Ambulatory Visit: Payer: Self-pay

## 2023-06-16 MED ORDER — TRUEPLUS 5-BEVEL PEN NEEDLES 32G X 4 MM MISC
1.0000 | Freq: Every day | 3 refills | Status: DC
  Filled 2023-06-16: qty 100, 25d supply, fill #0
  Filled 2023-07-19: qty 100, 25d supply, fill #1
  Filled 2023-08-15: qty 100, 25d supply, fill #2
  Filled 2023-09-12: qty 100, 25d supply, fill #3

## 2023-06-21 ENCOUNTER — Encounter: Payer: Self-pay | Admitting: Hematology & Oncology

## 2023-06-22 ENCOUNTER — Other Ambulatory Visit: Payer: Self-pay | Admitting: Internal Medicine

## 2023-06-22 ENCOUNTER — Other Ambulatory Visit: Payer: Self-pay

## 2023-06-22 ENCOUNTER — Ambulatory Visit: Payer: Self-pay | Admitting: Hematology & Oncology

## 2023-06-22 ENCOUNTER — Encounter: Payer: Self-pay | Admitting: Hematology & Oncology

## 2023-06-22 ENCOUNTER — Inpatient Hospital Stay: Payer: Self-pay

## 2023-06-22 ENCOUNTER — Inpatient Hospital Stay (HOSPITAL_BASED_OUTPATIENT_CLINIC_OR_DEPARTMENT_OTHER): Payer: Self-pay | Admitting: Medical Oncology

## 2023-06-22 ENCOUNTER — Ambulatory Visit: Payer: Self-pay

## 2023-06-22 ENCOUNTER — Inpatient Hospital Stay: Payer: Self-pay | Attending: Hematology & Oncology

## 2023-06-22 VITALS — BP 150/51 | HR 85 | Resp 17

## 2023-06-22 VITALS — BP 154/65 | HR 79 | Temp 98.9°F | Resp 17 | Wt 175.0 lb

## 2023-06-22 DIAGNOSIS — Z5112 Encounter for antineoplastic immunotherapy: Secondary | ICD-10-CM | POA: Insufficient documentation

## 2023-06-22 DIAGNOSIS — C220 Liver cell carcinoma: Secondary | ICD-10-CM

## 2023-06-22 DIAGNOSIS — E039 Hypothyroidism, unspecified: Secondary | ICD-10-CM | POA: Insufficient documentation

## 2023-06-22 DIAGNOSIS — Z79890 Hormone replacement therapy (postmenopausal): Secondary | ICD-10-CM | POA: Insufficient documentation

## 2023-06-22 DIAGNOSIS — D649 Anemia, unspecified: Secondary | ICD-10-CM

## 2023-06-22 DIAGNOSIS — Z7952 Long term (current) use of systemic steroids: Secondary | ICD-10-CM | POA: Insufficient documentation

## 2023-06-22 LAB — CMP (CANCER CENTER ONLY)
ALT: 13 U/L (ref 0–44)
AST: 11 U/L — ABNORMAL LOW (ref 15–41)
Albumin: 4.2 g/dL (ref 3.5–5.0)
Alkaline Phosphatase: 38 U/L (ref 38–126)
Anion gap: 7 (ref 5–15)
BUN: 15 mg/dL (ref 8–23)
CO2: 25 mmol/L (ref 22–32)
Calcium: 10.5 mg/dL — ABNORMAL HIGH (ref 8.9–10.3)
Chloride: 107 mmol/L (ref 98–111)
Creatinine: 0.76 mg/dL (ref 0.44–1.00)
GFR, Estimated: >60 mL/min (ref >60)
Glucose, Bld: 73 mg/dL (ref 70–99)
Potassium: 3.6 mmol/L (ref 3.5–5.1)
Sodium: 139 mmol/L (ref 135–145)
Total Bilirubin: 0.5 mg/dL (ref 0.0–1.2)
Total Protein: 6.7 g/dL (ref 6.5–8.1)

## 2023-06-22 LAB — CBC WITH DIFFERENTIAL (CANCER CENTER ONLY)
Abs Immature Granulocytes: 0.02 10*3/uL (ref 0.00–0.07)
Basophils Absolute: 0 10*3/uL (ref 0.0–0.1)
Basophils Relative: 1 %
Eosinophils Absolute: 0 10*3/uL (ref 0.0–0.5)
Eosinophils Relative: 0 %
HCT: 35.9 % — ABNORMAL LOW (ref 36.0–46.0)
Hemoglobin: 11.4 g/dL — ABNORMAL LOW (ref 12.0–15.0)
Immature Granulocytes: 0 %
Lymphocytes Relative: 26 %
Lymphs Abs: 1.6 10*3/uL (ref 0.7–4.0)
MCH: 25.3 pg — ABNORMAL LOW (ref 26.0–34.0)
MCHC: 31.8 g/dL (ref 30.0–36.0)
MCV: 79.6 fL — ABNORMAL LOW (ref 80.0–100.0)
Monocytes Absolute: 0.5 10*3/uL (ref 0.1–1.0)
Monocytes Relative: 8 %
Neutro Abs: 4.1 10*3/uL (ref 1.7–7.7)
Neutrophils Relative %: 65 %
Platelet Count: 208 10*3/uL (ref 150–400)
RBC: 4.51 MIL/uL (ref 3.87–5.11)
RDW: 18 % — ABNORMAL HIGH (ref 11.5–15.5)
WBC Count: 6.2 10*3/uL (ref 4.0–10.5)
nRBC: 0 % (ref 0.0–0.2)

## 2023-06-22 MED ORDER — HEPARIN SOD (PORK) LOCK FLUSH 100 UNIT/ML IV SOLN
500.0000 [IU] | Freq: Once | INTRAVENOUS | Status: AC | PRN
  Administered 2023-06-22: 500 [IU]

## 2023-06-22 MED ORDER — SODIUM CHLORIDE 0.9% FLUSH
10.0000 mL | INTRAVENOUS | Status: DC | PRN
Start: 2023-06-22 — End: 2023-06-22
  Administered 2023-06-22: 10 mL

## 2023-06-22 MED ORDER — SODIUM CHLORIDE 0.9 % IV SOLN
480.0000 mg | Freq: Once | INTRAVENOUS | Status: AC
  Administered 2023-06-22: 480 mg via INTRAVENOUS
  Filled 2023-06-22: qty 48

## 2023-06-22 MED ORDER — SODIUM CHLORIDE 0.9 % IV SOLN: Freq: Once | INTRAVENOUS | Status: AC

## 2023-06-22 NOTE — Patient Instructions (Signed)
 [facility] - A DEPT OF [facility]  Discharge Instructions: Thank you for choosing [facility] to provide your oncology and hematology care.   If you have a lab appointment with the Cancer Center, please go directly to the Cancer Center and check in at the registration area.  Wear comfortable clothing and clothing appropriate for easy access to any Portacath or PICC line.   We strive to give you quality time with your provider. You may need to reschedule your appointment if you arrive late (15 or more minutes).  Arriving late affects you and other patients whose appointments are after yours.  Also, if you miss three or more appointments without notifying the office, you may be dismissed from the clinic at the provider's discretion.      For prescription refill requests, have your pharmacy contact our office and allow 72 hours for refills to be completed.    Today you received the following chemotherapy and/or immunotherapy agents Nivolumab    To help prevent nausea and vomiting after your treatment, we encourage you to take your nausea medication as directed.  BELOW ARE SYMPTOMS THAT SHOULD BE REPORTED IMMEDIATELY: *FEVER GREATER THAN 100.4 F (38 C) OR HIGHER *CHILLS OR SWEATING *NAUSEA AND VOMITING THAT IS NOT CONTROLLED WITH YOUR NAUSEA MEDICATION *UNUSUAL SHORTNESS OF BREATH *UNUSUAL BRUISING OR BLEEDING *URINARY PROBLEMS (pain or burning when urinating, or frequent urination) *BOWEL PROBLEMS (unusual diarrhea, constipation, pain near the anus) TENDERNESS IN MOUTH AND THROAT WITH OR WITHOUT PRESENCE OF ULCERS (sore throat, sores in mouth, or a toothache) UNUSUAL RASH, SWELLING OR PAIN  UNUSUAL VAGINAL DISCHARGE OR ITCHING   Items with * indicate a potential emergency and should be followed up as soon as possible or go to the Emergency Department if any problems should occur.  Please show the CHEMOTHERAPY ALERT CARD or IMMUNOTHERAPY  ALERT CARD at check-in to the Emergency Department and triage nurse. Should you have questions after your visit or need to cancel or reschedule your appointment, please contact [facility] - A DEPT OF [facility]  [phone] and follow the prompts.  Office hours are 8:00 a.m. to 4:30 p.m. Monday - Friday. Please note that voicemails left after 4:00 p.m. may not be returned until the following business day.  We are closed weekends and major holidays. You have access to a nurse at all times for urgent questions. Please call the main number to the clinic [phone] and follow the prompts.  For any non-urgent questions, you may also contact your provider using MyChart. We now offer e-Visits for anyone 14 and older to request care online for non-urgent symptoms. For details visit [email or web address].   Also download the MyChart app! Go to the app store, search "MyChart", open the app, select [place], and log in with your MyChart username and password.

## 2023-06-22 NOTE — Patient Instructions (Signed)

## 2023-06-22 NOTE — Progress Notes (Signed)
 Hematology and Oncology Follow Up Visit  [name] [name] [id number] May 29, 1945 78 y.o. 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  She did have some skin toxicities secondary to treatment. She is on prednisone  at 10 mg a day.  Her dermatologist has done a fantastic job and trying to manage the skin toxicity from her treatments. She is also on Levothyroxine  for acquired hypothyroidism.   She did have the MRI that was done back on 04/20/2023.  This showed everything was very stable.  The liver disease had not progressed.  Everything looked fantastic.  Her last alpha-fetoprotein was 5.9 on 05/25/2023. This is stable.   She has had no abdominal pain. She has had no diarrhea.  TSH level last month was normal at 2.130  Her blood sugars have been managed pretty well.  She has had no cough or shortness of breath. She has had no bleeding.  She continues to gain weight on the prednisone .   Overall, I would say that her performance status is probably ECOG 1.      Wt Readings from Last 3 Encounters:  06/22/23 175 lb (79.4 kg)  06/08/23 177 lb (80.3 kg)  06/02/23 176 lb (79.8 kg)     Medications:  Allergies as of 06/22/2023       Reactions   Voriconazole  Shortness Of Breath, Swelling   Patient reported that tablets sent her to the ER   Bactrim [sulfamethoxazole-trimethoprim] Swelling, Other (See Comments)   Site of swelling not recalled   Clarithromycin  Swelling, Rash, Other (See Comments)    Daughter is unsure if Clarithromycin  or Amoxicillin  caused rash and swelling. Patient states no allergy to amoxicillan - has taken this recently with no issues. (12/02/21)        Medication List        Accurate as of Jun 22, 2023  1:14 PM. If you have any questions, ask your nurse or doctor.          brimonidine  0.15 % ophthalmic solution Commonly known as: ALPHAGAN  Place into the left eye 2 (two) times daily.   brimonidine  0.15 % ophthalmic solution Commonly known as: ALPHAGAN  Place 1 drop into the left eye 2 (two) times daily.   clobetasol  ointment 0.05 % Commonly known as: TEMOVATE  Apply topically.   diphenhydramine -acetaminophen  25-500 MG Tabs tablet Commonly known as: TYLENOL  PM Take by mouth.   Farxiga  5 MG Tabs tablet Generic drug: dapagliflozin  propanediol Take 1 tablet (5 mg total) by mouth daily before breakfast.   gabapentin  300 MG capsule Commonly known as: NEURONTIN  Take 1 capsule (300 mg total) by mouth 3 (three) times daily.   HumaLOG  KwikPen 200 UNIT/ML KwikPen Generic drug: insulin  lispro Inject 12 Units into the skin See admin instructions. Inject 12 units into the skin three times a day before meals, plus a sliding scale for a BGL above 155. Taper as directed by MD [name]. Max daily dose is 100 units. BGL <155 = give nothing; 156-180 = 1  unit; 181-205 = 2 units; 206-230 = 3 units; 231-255 = 4 units; 256-280 = 5 units; 281-305 = 6 units; 306-330 = 7 units; 331-355 = 8 units; 256-380 = 9 units. Each time Prednisone  is decreased, please decrease Humalog  by 4 units with each meal.   hydrALAZINE  10 MG tablet Commonly known as: APRESOLINE  Take 2 tablets (20 mg total) by mouth in the morning and at bedtime.   insulin  glargine 100 UNIT/ML Solostar Pen Commonly known as: LANTUS  Inject 12 Units into the skin daily.   levothyroxine  75 MCG tablet Commonly known as: Synthroid  Take 1 tablet (75 mcg total) by mouth daily before breakfast.    lidocaine -prilocaine  cream Commonly known as: EMLA  Apply 1 Application topically as needed (for port access).   losartan  100 MG tablet Commonly known as: COZAAR  Take 1 tablet (100 mg total) by mouth at bedtime.   NIFEdipine  30 MG 24 hr tablet Commonly known as: PROCARDIA -XL/NIFEDICAL-XL Take 1 tablet (30 mg total) by mouth in the morning and at bedtime.   Opdivo  100 MG/10ML Soln chemo injection Generic drug: nivolumab  Inject into the vein.   pantoprazole  40 MG tablet Commonly known as: Protonix  Take 1 tablet (40 mg total) by mouth 2 (two) times daily.   polyethylene glycol powder 17 GM/SCOOP powder Commonly known as: GLYCOLAX /MIRALAX  Take 17 g by mouth daily as needed for mild constipation.   predniSONE  10 MG tablet Commonly known as: DELTASONE  Take 1 tablet (10 mg total) by mouth in the morning.   True Metrix Blood Glucose Test test strip Generic drug: glucose blood Use as instructed   TRUEplus 5-Bevel Pen Needles 32G X 4 MM Misc Generic drug: Insulin  Pen Needle Use with insulins daily.        Allergies:  Allergies  Allergen Reactions   Voriconazole  Shortness Of Breath and Swelling    Patient reported that tablets sent her to the ER   Bactrim [Sulfamethoxazole-Trimethoprim] Swelling and Other (See Comments)    Site of swelling not recalled   Clarithromycin  Swelling, Rash and Other (See Comments)    Daughter is unsure if Clarithromycin  or Amoxicillin  caused rash and swelling.  Patient states no allergy to amoxicillan - has taken this recently with no issues. (12/02/21)    Past Medical History, Surgical history, Social history, and Family History were reviewed and updated.  Review of Systems: Review of Systems  Constitutional: Negative.   HENT: Negative.    Eyes: Negative.   Respiratory: Negative.    Cardiovascular: Negative.   Gastrointestinal: Negative.   Genitourinary: Negative.   Musculoskeletal: Negative.   Skin: Negative.   Neurological:   Positive for tingling.  Endo/Heme/Allergies: Negative.   Psychiatric/Behavioral: Negative.       Physical Exam: Vitals:   06/22/23 1157  BP: (!) 154/65  Pulse: 79  Resp: 17  Temp: 98.9 F (37.2 C)  SpO2: 99%     Wt Readings from Last 3 Encounters:  06/22/23 175 lb (79.4 kg)  06/08/23 177 lb (80.3 kg)  06/02/23 176 lb (79.8 kg)    Physical Exam Vitals reviewed.  HENT:     Head: Normocephalic and atraumatic.  Eyes:     Pupils: Pupils are equal, round, and reactive to light.     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     Cardiovascular:     Rate and Rhythm: Normal rate and regular rhythm.     Heart sounds: Normal heart sounds.  Pulmonary:  Effort: Pulmonary effort is normal.     Breath sounds: Normal breath sounds.  Abdominal:     General: Bowel sounds are normal.     Palpations: Abdomen is soft.     Comments: She has no fluid wave.  There is no guarding or rebound tenderness to palpation.  There is no palpable liver or spleen tip.  Musculoskeletal:        General: No tenderness or deformity. Normal range of motion.     Cervical back: Normal range of motion.  Lymphadenopathy:     Cervical: No cervical adenopathy.  Skin:    General: Skin is warm and dry.     Findings: No erythema or rash.     Comments: Skin exam does show a  papular rash on her lower legs.  This is above and below the knees.  These lesions are somewhat firm.  They are raised.  They are none tender.    Neurological:     Mental Status: She is alert and oriented to person, place, and time.  Psychiatric:        Behavior: Behavior normal.        Thought Content: Thought content normal.        Judgment: Judgment normal.       Lab Results  Component Value Date   WBC 6.2 06/22/2023   HGB 11.4 (L) 06/22/2023   HCT 35.9 (L) 06/22/2023   MCV 79.6 (L) 06/22/2023   PLT 208 06/22/2023   Lab Results  Component Value Date   FERRITIN 603 (H)  10/02/2020   IRON 31 (L) 10/02/2020   TIBC 241 10/02/2020   UIBC 211 10/02/2020   IRONPCTSAT 13 (L) 10/02/2020   Lab Results  Component Value Date   RBC 4.51 06/22/2023   No results found for: "KPAFRELGTCHN", "LAMBDASER", "KAPLAMBRATIO" No results found for: "IGGSERUM", "IGA", "IGMSERUM" No results found for: "TOTALPROTELP", "ALBUMINELP", "A1GS", "A2GS", "BETS", "BETA2SER", "GAMS", "MSPIKE", "SPEI"   Chemistry      Component Value Date/Time   NA 139 06/22/2023 1130   NA 140 01/22/2022 0849   K 3.6 06/22/2023 1130   CL 107 06/22/2023 1130   CO2 25 06/22/2023 1130   BUN 15 06/22/2023 1130   BUN 11 01/22/2022 0849   CREATININE 0.76 06/22/2023 1130   CREATININE 0.58 (L) 08/20/2021 1541      Component Value Date/Time   CALCIUM 10.5 (H) 06/22/2023 1130   ALKPHOS 38 06/22/2023 1130   AST 11 (L) 06/22/2023 1130   ALT 13 06/22/2023 1130   BILITOT 0.5 06/22/2023 1130      Encounter Diagnoses  Name Primary?   Hepatocellular carcinoma (HCC) Yes   Anemia, unspecified type     Impression and Plan: Ms. Aseghanka [name] is a very pleasant 78 yo female from [place] with hepatocellular carcinoma and Hepatitis C.  She actually has had treatment for the Hepatitis C.  I am sure that this is part of the reason why she has done so well with response.  Her Hepatocellular carcinoma is responding nicely to treatment.   We will continue her on treatment.  She has treatment every month.  Per Dr. Maria Shiner, in the future, if everything is holding steady, then we might build to move her appointments out to every 6 weeks.  We discussed that when she transitions to every 6 months we may be able to reduce her prednisone  scantly.   Hgb is down slightly today. No known bleeding. MCV is low- will check iron levels at  next visit   RTC 1 month MD, port labs(CBC w/, CMP, retic, iron, ferritin, AFP), treatment     [name] Davis, PA-C 5/7/20251:14 PM

## 2023-06-23 ENCOUNTER — Other Ambulatory Visit: Payer: Self-pay

## 2023-06-23 ENCOUNTER — Telehealth: Payer: Self-pay

## 2023-06-23 LAB — AFP TUMOR MARKER: AFP, Serum, Tumor Marker: 5.4 ng/mL (ref 0.0–9.2)

## 2023-06-23 NOTE — Telephone Encounter (Signed)
 Received notification from [employer] regarding approval for FARXIGA . Patient assistance approved from 06/23/2023 to 06/22/2024.  Medication will ship to [address], [place], [place] [address]  Pt ID: [id number]  Company phone: [phone]

## 2023-06-24 ENCOUNTER — Encounter: Payer: Self-pay | Admitting: *Deleted

## 2023-07-19 ENCOUNTER — Encounter: Payer: Self-pay | Admitting: Hematology & Oncology

## 2023-07-19 ENCOUNTER — Other Ambulatory Visit: Payer: Self-pay

## 2023-07-19 ENCOUNTER — Other Ambulatory Visit: Payer: Self-pay | Admitting: Hematology & Oncology

## 2023-07-19 DIAGNOSIS — C220 Liver cell carcinoma: Secondary | ICD-10-CM

## 2023-07-19 MED ORDER — LEVOTHYROXINE SODIUM 75 MCG PO TABS
75.0000 ug | ORAL_TABLET | Freq: Every day | ORAL | 3 refills | Status: DC
  Filled 2023-07-19: qty 30, 30d supply, fill #0
  Filled 2023-08-17: qty 30, 30d supply, fill #1
  Filled 2023-09-21: qty 30, 30d supply, fill #2
  Filled 2023-10-19: qty 30, 30d supply, fill #3

## 2023-07-20 ENCOUNTER — Inpatient Hospital Stay: Payer: Self-pay

## 2023-07-20 ENCOUNTER — Inpatient Hospital Stay (HOSPITAL_BASED_OUTPATIENT_CLINIC_OR_DEPARTMENT_OTHER): Payer: Self-pay | Admitting: Medical Oncology

## 2023-07-20 ENCOUNTER — Encounter: Payer: Self-pay | Admitting: Medical Oncology

## 2023-07-20 ENCOUNTER — Inpatient Hospital Stay: Payer: Self-pay | Attending: Hematology & Oncology

## 2023-07-20 VITALS — BP 141/61 | HR 88 | Resp 18

## 2023-07-20 VITALS — BP 138/76 | HR 83 | Temp 98.3°F | Resp 20 | Ht 63.0 in | Wt 172.0 lb

## 2023-07-20 DIAGNOSIS — Z95828 Presence of other vascular implants and grafts: Secondary | ICD-10-CM

## 2023-07-20 DIAGNOSIS — D649 Anemia, unspecified: Secondary | ICD-10-CM

## 2023-07-20 DIAGNOSIS — E032 Hypothyroidism due to medicaments and other exogenous substances: Secondary | ICD-10-CM

## 2023-07-20 DIAGNOSIS — Z79890 Hormone replacement therapy (postmenopausal): Secondary | ICD-10-CM | POA: Insufficient documentation

## 2023-07-20 DIAGNOSIS — C220 Liver cell carcinoma: Secondary | ICD-10-CM

## 2023-07-20 DIAGNOSIS — E611 Iron deficiency: Secondary | ICD-10-CM | POA: Insufficient documentation

## 2023-07-20 DIAGNOSIS — Z5112 Encounter for antineoplastic immunotherapy: Secondary | ICD-10-CM | POA: Insufficient documentation

## 2023-07-20 DIAGNOSIS — Z7952 Long term (current) use of systemic steroids: Secondary | ICD-10-CM | POA: Insufficient documentation

## 2023-07-20 LAB — RETIC PANEL
Immature Retic Fract: 28.1 % — ABNORMAL HIGH (ref 2.3–15.9)
RBC.: 4.59 MIL/uL (ref 3.87–5.11)
Retic Count, Absolute: 62.8 10*3/uL (ref 19.0–186.0)
Retic Ct Pct: 1.3 % (ref 0.4–3.1)
Reticulocyte Hemoglobin: 24.8 pg — ABNORMAL LOW (ref >27.9)

## 2023-07-20 LAB — CBC WITH DIFFERENTIAL (CANCER CENTER ONLY)
Abs Immature Granulocytes: 0.03 10*3/uL (ref 0.00–0.07)
Basophils Absolute: 0 10*3/uL (ref 0.0–0.1)
Basophils Relative: 1 %
Eosinophils Absolute: 0 10*3/uL (ref 0.0–0.5)
Eosinophils Relative: 0 %
HCT: 36.1 % (ref 36.0–46.0)
Hemoglobin: 11.3 g/dL — ABNORMAL LOW (ref 12.0–15.0)
Immature Granulocytes: 1 %
Lymphocytes Relative: 19 %
Lymphs Abs: 1.3 10*3/uL (ref 0.7–4.0)
MCH: 24.5 pg — ABNORMAL LOW (ref 26.0–34.0)
MCHC: 31.3 g/dL (ref 30.0–36.0)
MCV: 78.3 fL — ABNORMAL LOW (ref 80.0–100.0)
Monocytes Absolute: 0.5 10*3/uL (ref 0.1–1.0)
Monocytes Relative: 7 %
Neutro Abs: 4.8 10*3/uL (ref 1.7–7.7)
Neutrophils Relative %: 72 %
Platelet Count: 234 10*3/uL (ref 150–400)
RBC: 4.61 MIL/uL (ref 3.87–5.11)
RDW: 18.3 % — ABNORMAL HIGH (ref 11.5–15.5)
WBC Count: 6.6 10*3/uL (ref 4.0–10.5)
nRBC: 0 % (ref 0.0–0.2)

## 2023-07-20 LAB — CMP (CANCER CENTER ONLY)
ALT: 15 U/L (ref 0–44)
AST: 13 U/L — ABNORMAL LOW (ref 15–41)
Albumin: 4.1 g/dL (ref 3.5–5.0)
Alkaline Phosphatase: 43 U/L (ref 38–126)
Anion gap: 6 (ref 5–15)
BUN: 13 mg/dL (ref 8–23)
CO2: 25 mmol/L (ref 22–32)
Calcium: 9.7 mg/dL (ref 8.9–10.3)
Chloride: 110 mmol/L (ref 98–111)
Creatinine: 0.77 mg/dL (ref 0.44–1.00)
GFR, Estimated: >60 mL/min (ref >60)
Glucose, Bld: 102 mg/dL — ABNORMAL HIGH (ref 70–99)
Potassium: 3.5 mmol/L (ref 3.5–5.1)
Sodium: 141 mmol/L (ref 135–145)
Total Bilirubin: 0.4 mg/dL (ref 0.0–1.2)
Total Protein: 6.7 g/dL (ref 6.5–8.1)

## 2023-07-20 LAB — FERRITIN: Ferritin: 15 ng/mL (ref 11–307)

## 2023-07-20 LAB — IRON AND IRON BINDING CAPACITY (CC-WL,HP ONLY)
Iron: 36 ug/dL (ref 28–170)
Saturation Ratios: 9 % — ABNORMAL LOW (ref 10.4–31.8)
TIBC: 386 ug/dL (ref 250–450)
UIBC: 350 ug/dL (ref 148–442)

## 2023-07-20 MED ORDER — ZOLEDRONIC ACID 4 MG/100ML IV SOLN
4.0000 mg | Freq: Once | INTRAVENOUS | Status: AC
  Administered 2023-07-20: 4 mg via INTRAVENOUS
  Filled 2023-07-20: qty 100

## 2023-07-20 MED ORDER — SODIUM CHLORIDE 0.9 % IV SOLN
480.0000 mg | Freq: Once | INTRAVENOUS | Status: AC
  Administered 2023-07-20: 480 mg via INTRAVENOUS
  Filled 2023-07-20: qty 48

## 2023-07-20 MED ORDER — SODIUM CHLORIDE 0.9 % IV SOLN: Freq: Once | INTRAVENOUS | Status: AC

## 2023-07-20 MED ORDER — TRIAMCINOLONE ACETONIDE 0.025 % EX OINT: 1.0000 | TOPICAL_OINTMENT | Freq: Two times a day (BID) | CUTANEOUS | 0 refills | Status: AC

## 2023-07-20 MED ORDER — SODIUM CHLORIDE 0.9% FLUSH
10.0000 mL | INTRAVENOUS | Status: DC | PRN
  Administered 2023-07-20: 10 mL

## 2023-07-20 MED ORDER — HEPARIN SOD (PORK) LOCK FLUSH 100 UNIT/ML IV SOLN
500.0000 [IU] | Freq: Once | INTRAVENOUS | Status: AC | PRN
  Administered 2023-07-20: 500 [IU]

## 2023-07-20 MED ORDER — TRIAMCINOLONE ACETONIDE 0.025 % EX OINT: 1.0000 | TOPICAL_OINTMENT | Freq: Two times a day (BID) | CUTANEOUS | 0 refills | Status: DC

## 2023-07-20 NOTE — Progress Notes (Signed)
 Hematology and Oncology Follow Up Visit  [name] [name] [id number] [birth year] 78 y.o. 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  She did have some skin toxicities secondary to treatment. She is on prednisone  at 10 mg a day.  Her dermatologist has done a fantastic job and trying to manage the skin toxicity from her treatments. She has notice some itching of her lower back without rash. She is also on Levothyroxine  for acquired hypothyroidism.   She did have the MRI that was done back on 04/20/2023.  This showed everything was very stable.  The liver disease had not progressed.  Everything looked fantastic.  Her last alpha-fetoprotein was 5.4 on 06/22/2023. This is stable.   She has had no abdominal pain. She has had no diarrhea.  TSH level on 05/25/2023 was normal at 2.130  Her blood sugars have been managed pretty well with the addition of Farxiga .  She has had no cough or shortness of breath. She has had no bleeding.  Overall, I would say that her performance status is probably ECOG 1.      Wt Readings from Last 3 Encounters:  07/20/23 172 lb 0.6 oz (78 kg)  06/22/23 175 lb (79.4 kg)  06/08/23 177 lb (80.3 kg)     Medications:  Allergies as of 07/20/2023       Reactions   Voriconazole  Shortness Of Breath, Swelling   Patient reported that tablets sent her to the ER   Bactrim [sulfamethoxazole-trimethoprim] Swelling, Other (See Comments)   Site of swelling not recalled    Clarithromycin  Swelling, Rash, Other (See Comments)   Daughter is unsure if Clarithromycin  or Amoxicillin  caused rash and swelling. Patient states no allergy to amoxicillan - has taken this recently with no issues. (12/02/21)        Medication List        Accurate as of July 20, 2023 10:16 AM. If you have any questions, ask your nurse or doctor.          brimonidine  0.15 % ophthalmic solution Commonly known as: ALPHAGAN  Place 1 drop into the left eye 2 (two) times daily.   clobetasol  ointment 0.05 % Commonly known as: TEMOVATE  Apply topically.   diphenhydramine -acetaminophen  25-500 MG Tabs tablet Commonly known as: TYLENOL  PM Take by mouth.   Farxiga  5 MG Tabs tablet Generic drug: dapagliflozin  propanediol Take 1 tablet (5 mg total) by mouth daily before breakfast.   gabapentin  300 MG capsule Commonly known as: NEURONTIN  Take 1 capsule (300 mg total) by mouth 3 (three) times daily.   HumaLOG  KwikPen 200 UNIT/ML KwikPen Generic drug: insulin  lispro Inject 12 Units into the skin See admin instructions. Inject 12 units into the skin three times a day before meals, plus a sliding scale for a BGL above 155. Taper as directed by MD [name]. Max daily dose is 100 units. BGL <155 = give nothing; 156-180 = 1 unit; 181-205 = 2 units; 206-230 = 3 units; 231-255 = 4 units;  256-280 = 5 units; 281-305 = 6 units; 306-330 = 7 units; 331-355 = 8 units; 256-380 = 9 units. Each time Prednisone  is decreased, please decrease Humalog  by 4 units with each meal.   hydrALAZINE  10 MG tablet Commonly known as: APRESOLINE  Take 2 tablets (20 mg total) by mouth in the morning and at bedtime.   insulin  glargine 100 UNIT/ML Solostar Pen Commonly known as: LANTUS  Inject 12 Units into the skin daily.   levothyroxine  75 MCG tablet Commonly known as: Synthroid  Take 1 tablet (75 mcg total) by mouth daily before breakfast.   lidocaine -prilocaine  cream Commonly known as: EMLA  Apply 1 Application  topically as needed (for port access).   losartan  100 MG tablet Commonly known as: COZAAR  Take 1 tablet (100 mg total) by mouth at bedtime.   NIFEdipine  30 MG 24 hr tablet Commonly known as: PROCARDIA -XL/NIFEDICAL-XL Take 1 tablet (30 mg total) by mouth in the morning and at bedtime.   Opdivo  100 MG/10ML Soln chemo injection Generic drug: nivolumab  Inject into the vein.   pantoprazole  40 MG tablet Commonly known as: Protonix  Take 1 tablet (40 mg total) by mouth 2 (two) times daily.   polyethylene glycol powder 17 GM/SCOOP powder Commonly known as: GLYCOLAX /MIRALAX  Take 17 g by mouth daily as needed for mild constipation.   predniSONE  10 MG tablet Commonly known as: DELTASONE  Take 1 tablet (10 mg total) by mouth in the morning.   triamcinolone  0.025 % ointment Commonly known as: KENALOG  Apply 1 Application topically 2 (two) times daily. May be used for up to 2 weeks at a time. Restart as needed after 2 week break from this medication Started by: Sharla Davis   True Metrix Blood Glucose Test test strip Generic drug: glucose blood Use as instructed   TRUEplus 5-Bevel Pen Needles 32G X 4 MM Misc Generic drug: Insulin  Pen Needle Use with insulins daily.        Allergies:  Allergies  Allergen Reactions   Voriconazole  Shortness Of Breath and Swelling    Patient reported that tablets sent her to the ER   Bactrim [Sulfamethoxazole-Trimethoprim] Swelling and Other (See Comments)    Site of swelling not recalled   Clarithromycin  Swelling, Rash and Other (See Comments)    Daughter is unsure if Clarithromycin  or Amoxicillin  caused rash and swelling.  Patient states no allergy to amoxicillan - has taken this recently with no issues. (12/02/21)    Past Medical History, Surgical history, Social history, and Family History were reviewed and updated.  Review of Systems: Review of Systems  Constitutional: Negative.   HENT: Negative.    Eyes: Negative.   Respiratory:  Negative.    Cardiovascular: Negative.   Gastrointestinal: Negative.   Genitourinary: Negative.   Musculoskeletal: Negative.   Skin: Negative.   Neurological:  Positive for tingling.  Endo/Heme/Allergies: Negative.   Psychiatric/Behavioral: Negative.       Physical Exam: Vitals:   07/20/23 0843  BP: 138/76  Pulse: 83  Resp: 20  Temp: 98.3 F (36.8 C)  SpO2: 100%     Wt Readings from Last 3 Encounters:  07/20/23 172 lb 0.6 oz (78 kg)  06/22/23 175 lb (79.4 kg)  06/08/23 177 lb (80.3 kg)    Physical Exam Vitals reviewed.  HENT:     Head: Normocephalic and atraumatic.  Eyes:     Pupils: Pupils are equal, round, and reactive to light.     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     Cardiovascular:     Rate and Rhythm: Normal rate and regular rhythm.     Heart sounds: Normal heart sounds.  Pulmonary:     Effort: Pulmonary effort is normal.     Breath sounds: Normal breath sounds.  Abdominal:     General: Bowel sounds are normal.     Palpations: Abdomen is soft.     Comments: She has no fluid wave.  There is no guarding or rebound tenderness to palpation.    Musculoskeletal:        General: No tenderness or deformity. Normal range of motion.     Cervical back: Normal range of motion.  Lymphadenopathy:     Cervical: No cervical adenopathy.  Skin:    General: Skin is warm and dry.     Findings: No erythema or rash.     Comments: Skin exam does show a  papular rash on her lower legs.   These lesions are somewhat firm.  They are raised.  They are none tender.  NO rash of lower back  Neurological:     Mental Status: She is alert and oriented to person, place, and time.  Psychiatric:        Behavior: Behavior normal.        Thought Content: Thought content normal.        Judgment: Judgment normal.       Lab Results  Component Value Date   WBC 6.6 07/20/2023   HGB 11.3 (L) 07/20/2023   HCT 36.1 07/20/2023    MCV 78.3 (L) 07/20/2023   PLT 234 07/20/2023   Lab Results  Component Value Date   FERRITIN 603 (H) 10/02/2020   IRON 31 (L) 10/02/2020   TIBC 241 10/02/2020   UIBC 211 10/02/2020   IRONPCTSAT 13 (L) 10/02/2020   Lab Results  Component Value Date   RETICCTPCT 1.3 07/20/2023   RBC 4.59 07/20/2023   RBC 4.61 07/20/2023   No results found for: "KPAFRELGTCHN", "LAMBDASER", "KAPLAMBRATIO" No results found for: "IGGSERUM", "IGA", "IGMSERUM" No results found for: "TOTALPROTELP", "ALBUMINELP", "A1GS", "A2GS", "BETS", "BETA2SER", "GAMS", "MSPIKE", "SPEI"   Chemistry      Component Value Date/Time   NA 141 07/20/2023 0825   NA 140 01/22/2022 0849   K 3.5 07/20/2023 0825   CL 110 07/20/2023 0825   CO2 25 07/20/2023 0825   BUN 13 07/20/2023 0825   BUN 11 01/22/2022 0849   CREATININE 0.77 07/20/2023 0825   CREATININE 0.58 (L) 08/20/2021 1541      Component Value Date/Time   CALCIUM 9.7 07/20/2023 0825   ALKPHOS 43 07/20/2023 0825   AST 13 (L) 07/20/2023 0825   ALT 15 07/20/2023 0825   BILITOT 0.4 07/20/2023 0825      Encounter Diagnoses  Name Primary?   Hepatocellular carcinoma (HCC) Yes   Anemia, unspecified type    Hypothyroidism due to medication    Port-A-Cath in place      Impression and Plan: Ms. Aseghanka [name] is a very pleasant 78 yo female from [place] with hepatocellular carcinoma and Hepatitis C.  She actually has had treatment for the Hepatitis C.  I am sure that this is part of the reason why she has done so well with response.  Her Hepatocellular carcinoma is responding nicely to treatment.   We will continue her on treatment.  She has treatment every month.  Per Dr. Maria Shiner, if her repeat MR liver scan looks good he would be agreeable with stretching  her treatments from every 4 weeks to every 6 weeks. This may help with her rash.   Hgb essentially stable. Nutritional labs pending. Will supplement if indicated.   MR liver order placed- to be completed  within the next month.  RTC 1 month MD only , port labs(CBC w/, CMP, retic, iron, ferritin, AFP), treatment     Sharla Davis, PA-C 6/4/202510:16 AM

## 2023-07-20 NOTE — Patient Instructions (Signed)
 [facility] - A DEPT OF [facility]  Discharge Instructions: Thank you for choosing [facility] to provide your oncology and hematology care.   If you have a lab appointment with the Cancer Center, please go directly to the Cancer Center and check in at the registration area.  Wear comfortable clothing and clothing appropriate for easy access to any Portacath or PICC line.   We strive to give you quality time with your provider. You may need to reschedule your appointment if you arrive late (15 or more minutes).  Arriving late affects you and other patients whose appointments are after yours.  Also, if you miss three or more appointments without notifying the office, you may be dismissed from the clinic at the provider's discretion.      For prescription refill requests, have your pharmacy contact our office and allow 72 hours for refills to be completed.    Today you received the following chemotherapy and/or immunotherapy agents Opdivo       To help prevent nausea and vomiting after your treatment, we encourage you to take your nausea medication as directed.  BELOW ARE SYMPTOMS THAT SHOULD BE REPORTED IMMEDIATELY: *FEVER GREATER THAN 100.4 F (38 C) OR HIGHER *CHILLS OR SWEATING *NAUSEA AND VOMITING THAT IS NOT CONTROLLED WITH YOUR NAUSEA MEDICATION *UNUSUAL SHORTNESS OF BREATH *UNUSUAL BRUISING OR BLEEDING *URINARY PROBLEMS (pain or burning when urinating, or frequent urination) *BOWEL PROBLEMS (unusual diarrhea, constipation, pain near the anus) TENDERNESS IN MOUTH AND THROAT WITH OR WITHOUT PRESENCE OF ULCERS (sore throat, sores in mouth, or a toothache) UNUSUAL RASH, SWELLING OR PAIN  UNUSUAL VAGINAL DISCHARGE OR ITCHING   Items with * indicate a potential emergency and should be followed up as soon as possible or go to the Emergency Department if any problems should occur.  Please show the CHEMOTHERAPY ALERT CARD or IMMUNOTHERAPY  ALERT CARD at check-in to the Emergency Department and triage nurse. Should you have questions after your visit or need to cancel or reschedule your appointment, please contact [facility] - A DEPT OF [facility]  [phone] and follow the prompts.  Office hours are 8:00 a.m. to 4:30 p.m. Monday - Friday. Please note that voicemails left after 4:00 p.m. may not be returned until the following business day.  We are closed weekends and major holidays. You have access to a nurse at all times for urgent questions. Please call the main number to the clinic [phone] and follow the prompts.  For any non-urgent questions, you may also contact your provider using MyChart. We now offer e-Visits for anyone 77 and older to request care online for non-urgent symptoms. For details visit [email or web address].   Also download the MyChart app! Go to the app store, search "MyChart", open the app, select [facility], and log in with your MyChart username and password.

## 2023-07-21 ENCOUNTER — Ambulatory Visit: Payer: Self-pay | Admitting: Medical Oncology

## 2023-07-21 ENCOUNTER — Other Ambulatory Visit: Payer: Self-pay | Admitting: Medical Oncology

## 2023-07-21 LAB — AFP TUMOR MARKER: AFP, Serum, Tumor Marker: 5.2 ng/mL (ref 0.0–9.2)

## 2023-07-22 ENCOUNTER — Other Ambulatory Visit: Payer: Self-pay

## 2023-07-25 ENCOUNTER — Telehealth (HOSPITAL_BASED_OUTPATIENT_CLINIC_OR_DEPARTMENT_OTHER): Payer: Self-pay

## 2023-08-01 ENCOUNTER — Encounter: Payer: Self-pay | Admitting: Hematology & Oncology

## 2023-08-01 ENCOUNTER — Other Ambulatory Visit: Payer: Self-pay

## 2023-08-02 ENCOUNTER — Other Ambulatory Visit: Payer: Self-pay

## 2023-08-02 LAB — HM DIABETES EYE EXAM

## 2023-08-03 ENCOUNTER — Inpatient Hospital Stay: Payer: Self-pay

## 2023-08-03 VITALS — BP 136/62 | HR 86 | Temp 97.6°F | Resp 18

## 2023-08-03 DIAGNOSIS — C220 Liver cell carcinoma: Secondary | ICD-10-CM

## 2023-08-03 MED ORDER — SODIUM CHLORIDE 0.9 % IV SOLN: Freq: Once | INTRAVENOUS | Status: AC

## 2023-08-03 MED ORDER — SODIUM CHLORIDE 0.9% FLUSH: 3.0000 mL | Freq: Once | INTRAVENOUS | Status: AC | PRN

## 2023-08-03 MED ORDER — HEPARIN SOD (PORK) LOCK FLUSH 100 UNIT/ML IV SOLN: 250.0000 [IU] | Freq: Once | INTRAVENOUS | Status: AC | PRN

## 2023-08-03 MED ORDER — HEPARIN SOD (PORK) LOCK FLUSH 100 UNIT/ML IV SOLN: 500.0000 [IU] | Freq: Once | INTRAVENOUS | Status: AC | PRN

## 2023-08-03 MED ORDER — SODIUM CHLORIDE 0.9% FLUSH
10.0000 mL | Freq: Once | INTRAVENOUS | Status: AC | PRN
  Administered 2023-08-03: 10 mL

## 2023-08-03 MED ORDER — HEPARIN SOD (PORK) LOCK FLUSH 100 UNIT/ML IV SOLN
500.0000 [IU] | Freq: Once | INTRAVENOUS | Status: AC | PRN
  Administered 2023-08-03: 500 [IU]

## 2023-08-03 MED ORDER — ALTEPLASE 2 MG IJ SOLR: 2.0000 mg | Freq: Once | INTRAMUSCULAR | Status: AC | PRN

## 2023-08-03 MED ORDER — SODIUM CHLORIDE 0.9% FLUSH: 10.0000 mL | Freq: Once | INTRAVENOUS | Status: AC | PRN

## 2023-08-03 MED ORDER — SODIUM CHLORIDE 0.9 % IV SOLN
300.0000 mg | Freq: Once | INTRAVENOUS | Status: AC
  Administered 2023-08-03: 300 mg via INTRAVENOUS
  Filled 2023-08-03: qty 300

## 2023-08-03 MED ORDER — SODIUM CHLORIDE 0.9 % IV SOLN: INTRAVENOUS | Status: AC

## 2023-08-03 NOTE — Patient Instructions (Signed)

## 2023-08-07 ENCOUNTER — Ambulatory Visit (HOSPITAL_BASED_OUTPATIENT_CLINIC_OR_DEPARTMENT_OTHER)
Admission: RE | Admit: 2023-08-07 | Discharge: 2023-08-07 | Disposition: A | Discharge status: Home / Self Care | Payer: Self-pay | Source: Ambulatory Visit | Attending: Medical Oncology | Admitting: Medical Oncology

## 2023-08-07 DIAGNOSIS — C220 Liver cell carcinoma: Secondary | ICD-10-CM | POA: Insufficient documentation

## 2023-08-07 MED ORDER — GADOBUTROL 1 MMOL/ML IV SOLN
7.8000 mL | Freq: Once | INTRAVENOUS | Status: AC | PRN
  Administered 2023-08-07: 7.8 mL via INTRAVENOUS

## 2023-08-10 ENCOUNTER — Inpatient Hospital Stay: Payer: Self-pay

## 2023-08-10 VITALS — BP 147/71 | HR 78 | Resp 19

## 2023-08-10 DIAGNOSIS — C220 Liver cell carcinoma: Secondary | ICD-10-CM

## 2023-08-10 MED ORDER — SODIUM CHLORIDE 0.9 % IV SOLN: INTRAVENOUS | Status: DC

## 2023-08-10 MED ORDER — HEPARIN SOD (PORK) LOCK FLUSH 100 UNIT/ML IV SOLN
250.0000 [IU] | Freq: Once | INTRAVENOUS | Status: AC | PRN
  Administered 2023-08-10: 250 [IU]

## 2023-08-10 MED ORDER — SODIUM CHLORIDE 0.9% FLUSH
10.0000 mL | Freq: Once | INTRAVENOUS | Status: AC | PRN
  Administered 2023-08-10: 10 mL

## 2023-08-10 MED ORDER — SODIUM CHLORIDE 0.9 % IV SOLN
300.0000 mg | Freq: Once | INTRAVENOUS | Status: AC
  Administered 2023-08-10: 300 mg via INTRAVENOUS
  Filled 2023-08-10: qty 300

## 2023-08-10 NOTE — Patient Instructions (Signed)

## 2023-08-15 ENCOUNTER — Encounter: Payer: Self-pay | Admitting: Hematology & Oncology

## 2023-08-15 ENCOUNTER — Ambulatory Visit: Payer: Self-pay | Admitting: Hematology & Oncology

## 2023-08-15 ENCOUNTER — Other Ambulatory Visit: Payer: Self-pay | Admitting: Internal Medicine

## 2023-08-15 ENCOUNTER — Other Ambulatory Visit: Payer: Self-pay

## 2023-08-15 DIAGNOSIS — I152 Hypertension secondary to endocrine disorders: Secondary | ICD-10-CM

## 2023-08-15 NOTE — Telephone Encounter (Signed)
-----   Message from Maude JONELLE Crease sent at 08/15/2023  2:33 PM EDT ----- Please call and let her know that there was no obvious cancer progression.SABRA ----- Message ----- From: Tonette Lauraine CHRISTELLA DEVONNA Sent: 08/15/2023   2:05 PM EDT To: Maude JONELLE Crease, MD   ----- Message ----- From: Interface, Rad Results In Sent: 08/13/2023   9:20 AM EDT To: Lauraine CHRISTELLA Tonette, PA-C

## 2023-08-15 NOTE — Telephone Encounter (Signed)
 Advised via MyChart.

## 2023-08-16 ENCOUNTER — Encounter: Payer: Self-pay | Admitting: Hematology & Oncology

## 2023-08-16 ENCOUNTER — Other Ambulatory Visit: Payer: Self-pay

## 2023-08-16 DIAGNOSIS — C220 Liver cell carcinoma: Secondary | ICD-10-CM

## 2023-08-16 DIAGNOSIS — D649 Anemia, unspecified: Secondary | ICD-10-CM

## 2023-08-16 MED ORDER — LOSARTAN POTASSIUM 100 MG PO TABS
100.0000 mg | ORAL_TABLET | Freq: Every day | ORAL | 1 refills | Status: DC
  Filled 2023-08-16: qty 90, 90d supply, fill #0
  Filled 2023-09-11 – 2023-11-14 (×3): qty 90, 90d supply, fill #1

## 2023-08-17 ENCOUNTER — Inpatient Hospital Stay: Payer: Self-pay | Attending: Hematology & Oncology

## 2023-08-17 ENCOUNTER — Inpatient Hospital Stay (HOSPITAL_BASED_OUTPATIENT_CLINIC_OR_DEPARTMENT_OTHER): Payer: Self-pay | Admitting: Hematology & Oncology

## 2023-08-17 ENCOUNTER — Other Ambulatory Visit: Payer: Self-pay

## 2023-08-17 ENCOUNTER — Encounter: Payer: Self-pay | Admitting: Hematology & Oncology

## 2023-08-17 ENCOUNTER — Inpatient Hospital Stay: Payer: Self-pay

## 2023-08-17 VITALS — BP 142/59 | HR 80 | Temp 97.4°F | Resp 19 | Ht 63.0 in | Wt 174.0 lb

## 2023-08-17 DIAGNOSIS — D649 Anemia, unspecified: Secondary | ICD-10-CM | POA: Insufficient documentation

## 2023-08-17 DIAGNOSIS — Z7984 Long term (current) use of oral hypoglycemic drugs: Secondary | ICD-10-CM | POA: Insufficient documentation

## 2023-08-17 DIAGNOSIS — Z7952 Long term (current) use of systemic steroids: Secondary | ICD-10-CM | POA: Insufficient documentation

## 2023-08-17 DIAGNOSIS — B1920 Unspecified viral hepatitis C without hepatic coma: Secondary | ICD-10-CM | POA: Insufficient documentation

## 2023-08-17 DIAGNOSIS — E039 Hypothyroidism, unspecified: Secondary | ICD-10-CM | POA: Insufficient documentation

## 2023-08-17 DIAGNOSIS — Z5112 Encounter for antineoplastic immunotherapy: Secondary | ICD-10-CM | POA: Insufficient documentation

## 2023-08-17 DIAGNOSIS — C220 Liver cell carcinoma: Secondary | ICD-10-CM | POA: Insufficient documentation

## 2023-08-17 DIAGNOSIS — Z883 Allergy status to other anti-infective agents status: Secondary | ICD-10-CM | POA: Insufficient documentation

## 2023-08-17 DIAGNOSIS — Z881 Allergy status to other antibiotic agents status: Secondary | ICD-10-CM | POA: Insufficient documentation

## 2023-08-17 LAB — RETIC PANEL
Immature Retic Fract: 26.8 % — ABNORMAL HIGH (ref 2.3–15.9)
RBC.: 4.86 MIL/uL (ref 3.87–5.11)
Retic Count, Absolute: 114.7 10*3/uL (ref 19.0–186.0)
Retic Ct Pct: 2.4 % (ref 0.4–3.1)
Reticulocyte Hemoglobin: 31.8 pg (ref >27.9)

## 2023-08-17 LAB — CMP (CANCER CENTER ONLY)
ALT: 23 U/L (ref 0–44)
AST: 18 U/L (ref 15–41)
Albumin: 4.1 g/dL (ref 3.5–5.0)
Alkaline Phosphatase: 47 U/L (ref 38–126)
Anion gap: 7 (ref 5–15)
BUN: 12 mg/dL (ref 8–23)
CO2: 23 mmol/L (ref 22–32)
Calcium: 9.9 mg/dL (ref 8.9–10.3)
Chloride: 108 mmol/L (ref 98–111)
Creatinine: 0.66 mg/dL (ref 0.44–1.00)
GFR, Estimated: >60 mL/min (ref >60)
Glucose, Bld: 140 mg/dL — ABNORMAL HIGH (ref 70–99)
Potassium: 3.9 mmol/L (ref 3.5–5.1)
Sodium: 138 mmol/L (ref 135–145)
Total Bilirubin: 0.6 mg/dL (ref 0.0–1.2)
Total Protein: 6.8 g/dL (ref 6.5–8.1)

## 2023-08-17 LAB — CBC WITH DIFFERENTIAL (CANCER CENTER ONLY)
Abs Immature Granulocytes: 0.01 10*3/uL (ref 0.00–0.07)
Basophils Absolute: 0 10*3/uL (ref 0.0–0.1)
Basophils Relative: 0 %
Eosinophils Absolute: 0 10*3/uL (ref 0.0–0.5)
Eosinophils Relative: 1 %
HCT: 38.6 % (ref 36.0–46.0)
Hemoglobin: 12 g/dL (ref 12.0–15.0)
Immature Granulocytes: 0 %
Lymphocytes Relative: 19 %
Lymphs Abs: 1 10*3/uL (ref 0.7–4.0)
MCH: 25 pg — ABNORMAL LOW (ref 26.0–34.0)
MCHC: 31.1 g/dL (ref 30.0–36.0)
MCV: 80.4 fL (ref 80.0–100.0)
Monocytes Absolute: 0.4 10*3/uL (ref 0.1–1.0)
Monocytes Relative: 8 %
Neutro Abs: 3.7 10*3/uL (ref 1.7–7.7)
Neutrophils Relative %: 72 %
Platelet Count: 201 10*3/uL (ref 150–400)
RBC: 4.8 MIL/uL (ref 3.87–5.11)
RDW: 23.2 % — ABNORMAL HIGH (ref 11.5–15.5)
WBC Count: 5.1 10*3/uL (ref 4.0–10.5)
nRBC: 0 % (ref 0.0–0.2)

## 2023-08-17 LAB — IRON AND IRON BINDING CAPACITY (CC-WL,HP ONLY)
Iron: 120 ug/dL (ref 28–170)
Saturation Ratios: 37 % — ABNORMAL HIGH (ref 10.4–31.8)
TIBC: 328 ug/dL (ref 250–450)
UIBC: 208 ug/dL (ref 148–442)

## 2023-08-17 LAB — FERRITIN: Ferritin: 230 ng/mL (ref 11–307)

## 2023-08-17 MED ORDER — SODIUM CHLORIDE 0.9 % IV SOLN: Freq: Once | INTRAVENOUS | Status: AC

## 2023-08-17 MED ORDER — SODIUM CHLORIDE 0.9% FLUSH
10.0000 mL | INTRAVENOUS | Status: DC | PRN
  Administered 2023-08-17: 10 mL

## 2023-08-17 MED ORDER — SODIUM CHLORIDE 0.9 % IV SOLN: INTRAVENOUS | Status: DC

## 2023-08-17 MED ORDER — SODIUM CHLORIDE 0.9 % IV SOLN
480.0000 mg | Freq: Once | INTRAVENOUS | Status: AC
  Administered 2023-08-17: 480 mg via INTRAVENOUS
  Filled 2023-08-17: qty 48

## 2023-08-17 MED ORDER — HEPARIN SOD (PORK) LOCK FLUSH 100 UNIT/ML IV SOLN
500.0000 [IU] | Freq: Once | INTRAVENOUS | Status: AC | PRN
  Administered 2023-08-17: 500 [IU]

## 2023-08-17 MED ORDER — SODIUM CHLORIDE 0.9 % IV SOLN
300.0000 mg | Freq: Once | INTRAVENOUS | Status: AC
  Administered 2023-08-17: 300 mg via INTRAVENOUS
  Filled 2023-08-17: qty 300

## 2023-08-17 NOTE — Patient Instructions (Signed)
 Nivolumab Injection What is this medication? NIVOLUMAB (nye VOL ue mab) treats some types of cancer. It works by helping your immune system slow or stop the spread of cancer cells. It is a monoclonal antibody. This medicine may be used for other purposes; ask your health care provider or pharmacist if you have questions. COMMON BRAND NAME(S): Opdivo What should I tell my care team before I take this medication? They need to know if you have any of these conditions: Allogeneic stem cell transplant (uses someone else's stem cells) Autoimmune diseases, such as Crohn disease, ulcerative colitis, lupus History of chest radiation Nervous system problems, such as Guillain-Barre syndrome or myasthenia gravis Organ transplant An unusual or allergic reaction to nivolumab, other medications, foods, dyes, or preservatives Pregnant or trying to get pregnant Breast-feeding How should I use this medication? This medication is infused into a vein. It is given in a hospital or clinic setting. A special MedGuide will be given to you before each treatment. Be sure to read this information carefully each time. Talk to your care team about the use of this medication in children. While it may be prescribed for children as young as 12 years for selected conditions, precautions do apply. Overdosage: If you think you have taken too much of this medicine contact a poison control center or emergency room at once. NOTE: This medicine is only for you. Do not share this medicine with others. What if I miss a dose? Keep appointments for follow-up doses. It is important not to miss your dose. Call your care team if you are unable to keep an appointment. What may interact with this medication? Interactions have not been studied. This list may not describe all possible interactions. Give your health care provider a list of all the medicines, herbs, non-prescription drugs, or dietary supplements you use. Also tell them if you  smoke, drink alcohol, or use illegal drugs. Some items may interact with your medicine. What should I watch for while using this medication? Your condition will be monitored carefully while you are receiving this medication. You may need blood work while taking this medication. This medication may cause serious skin reactions. They can happen weeks to months after starting the medication. Contact your care team right away if you notice fevers or flu-like symptoms with a rash. The rash may be red or purple and then turn into blisters or peeling of the skin. You may also notice a red rash with swelling of the face, lips, or lymph nodes in your neck or under your arms. Tell your care team right away if you have any change in your eyesight. Talk to your care team if you are pregnant or think you might be pregnant. A negative pregnancy test is required before starting this medication. A reliable form of contraception is recommended while taking this medication and for 5 months after the last dose. Talk to your care team about effective forms of contraception. Do not breast-feed while taking this medication and for 5 months after the last dose. What side effects may I notice from receiving this medication? Side effects that you should report to your care team as soon as possible: Allergic reactions--skin rash, itching, hives, swelling of the face, lips, tongue, or throat Dry cough, shortness of breath or trouble breathing Eye pain, redness, irritation, or discharge with blurry or decreased vision Heart muscle inflammation--unusual weakness or fatigue, shortness of breath, chest pain, fast or irregular heartbeat, dizziness, swelling of the ankles, feet, or hands Hormone  gland problems--headache, sensitivity to light, unusual weakness or fatigue, dizziness, fast or irregular heartbeat, increased sensitivity to cold or heat, excessive sweating, constipation, hair loss, increased thirst or amount of urine,  tremors or shaking, irritability Infusion reactions--chest pain, shortness of breath or trouble breathing, feeling faint or lightheaded Kidney injury (glomerulonephritis)--decrease in the amount of urine, red or dark brown urine, foamy or bubbly urine, swelling of the ankles, hands, or feet Liver injury--right upper belly pain, loss of appetite, nausea, light-colored stool, dark yellow or brown urine, yellowing skin or eyes, unusual weakness or fatigue Pain, tingling, or numbness in the hands or feet, muscle weakness, change in vision, confusion or trouble speaking, loss of balance or coordination, trouble walking, seizures Rash, fever, and swollen lymph nodes Redness, blistering, peeling, or loosening of the skin, including inside the mouth Sudden or severe stomach pain, bloody diarrhea, fever, nausea, vomiting Side effects that usually do not require medical attention (report these to your care team if they continue or are bothersome): Bone, joint, or muscle pain Diarrhea Fatigue Loss of appetite Nausea Skin rash This list may not describe all possible side effects. Call your doctor for medical advice about side effects. You may report side effects to FDA at 1-800-FDA-1088. Where should I keep my medication? This medication is given in a hospital or clinic. It will not be stored at home. NOTE: This sheet is a summary. It may not cover all possible information. If you have questions about this medicine, talk to your doctor, pharmacist, or health care provider.  2024 Elsevier/Gold Standard (2021-06-01 00:00:00)

## 2023-08-17 NOTE — Progress Notes (Signed)
 Hematology and Oncology Follow Up Visit  [name] [name] [id number] [birth year] 78 y.o. 08/17/2023   Principle Diagnosis:  Hepatocellular carcinoma-multifocal Hepatitis C   Current Therapy:        Status post cycle 1 of nivolumab /ipilimumab  -- d/c on 10/13/2020 due to hepatic toxicity Nivolumab  480 mg IV every 4 weeks - s/p cycle #37 -- started 11/21/2020 --changed to 480 mg on 08/2021 Nexavar  200 mg po BID -- to be taken while in [place] -- start on 02/26/2022 -- d/c on 06/16/2022   Interim History:  [name] [name] is here today for follow-up.  She is doing quite nicely.  Her skin is doing much better.  She is on prednisone  at 10 mg a day.  Her dermatologist has done a fantastic job and trying to manage the skin toxicity from her treatments.  On occasion, she does get injections into some of the skin lesions.  She did have an MRI that was done.  This was done on 08/07/2023.  Everything looked stable.  However, there was an indication that there was a lesion that measured 2.2 x 2 cm.  Again it is hard to say what this really indicates.  We will just have to watch this area.  Her last tumor marker was nice and stable at 5.2 with her alpha-fetoprotein.  She has had no problems with the eyes.  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  She does see optometry for this.  She has had no problem with her blood sugars.  She has gained a little bit of weight.  She has had no diarrhea.  She has had no bleeding.  Overall, I would say that her performance status about ECOG 1.      Medications:  Allergies as of 08/17/2023       Reactions   Voriconazole  Shortness Of Breath, Swelling   Patient reported that tablets sent her to the ER   Bactrim [sulfamethoxazole-trimethoprim] Swelling, Other (See Comments)   Site of swelling not recalled   Clarithromycin  Swelling, Rash, Other (See Comments)   Daughter is unsure if Clarithromycin  or Amoxicillin   caused rash and swelling. Patient states no allergy to amoxicillan - has taken this recently with no issues. (12/02/21)        Medication List        Accurate as of August 17, 2023 11:56 AM. If you have any questions, ask your nurse or doctor.          Basaglar  KwikPen 100 UNIT/ML Inject 12 Units into the skin daily.   brimonidine  0.15 % ophthalmic solution Commonly known as: ALPHAGAN  Place 1 drop into the left eye 2 (two) times daily.   clobetasol  ointment 0.05 % Commonly known as: TEMOVATE  Apply topically.   diphenhydramine -acetaminophen  25-500 MG Tabs tablet Commonly known as: TYLENOL  PM Take by mouth.   Farxiga  5 MG Tabs tablet Generic drug: dapagliflozin  propanediol Take 1 tablet (5 mg total) by mouth daily before breakfast.   gabapentin  300 MG capsule Commonly known as: NEURONTIN  Take 1 capsule (300 mg total) by mouth 3 (three) times daily.   HumaLOG  KwikPen 200 UNIT/ML KwikPen Generic drug: insulin  lispro Inject 12 Units into the skin See admin instructions. Inject 12 units into the skin three times a day before meals, plus a sliding scale for a BGL above 155. Taper as directed by MD [name]. Max daily dose is 100 units. BGL <155 = give nothing; 156-180 = 1 unit; 181-205 = 2 units;  206-230 = 3 units; 231-255 = 4 units; 256-280 = 5 units; 281-305 = 6 units; 306-330 = 7 units; 331-355 = 8 units; 256-380 = 9 units. Each time Prednisone  is decreased, please decrease Humalog  by 4 units with each meal.   hydrALAZINE  10 MG tablet Commonly known as: APRESOLINE  Take 2 tablets (20 mg total) by mouth in the morning and at bedtime.   levothyroxine  75 MCG tablet Commonly known as: Synthroid  Take 1 tablet (75 mcg total) by mouth daily before breakfast.   lidocaine -prilocaine  cream Commonly known as: EMLA  Apply 1 Application topically as needed (for port access).   losartan  100 MG tablet Commonly known as: COZAAR  Take 1 tablet (100 mg total) by mouth at bedtime.    NIFEdipine  30 MG 24 hr tablet Commonly known as: PROCARDIA -XL/NIFEDICAL-XL Take 1 tablet (30 mg total) by mouth in the morning and at bedtime.   Opdivo  100 MG/10ML Soln chemo injection Generic drug: nivolumab  Inject into the vein.   pantoprazole  40 MG tablet Commonly known as: Protonix  Take 1 tablet (40 mg total) by mouth 2 (two) times daily.   polyethylene glycol powder 17 GM/SCOOP powder Commonly known as: GLYCOLAX /MIRALAX  Take 17 g by mouth daily as needed for mild constipation.   predniSONE  10 MG tablet Commonly known as: DELTASONE  Take 1 tablet (10 mg total) by mouth in the morning.   triamcinolone  0.025 % ointment Commonly known as: KENALOG  Apply 1 Application topically 2 (two) times daily. May be used for up to 2 weeks at a time. Restart as needed after 2 week break from this medication   True Metrix Blood Glucose Test test strip Generic drug: glucose blood Use as instructed   TRUEplus 5-Bevel Pen Needles 32G X 4 MM Misc Generic drug: Insulin  Pen Needle Use with insulins daily.        Allergies:  Allergies  Allergen Reactions   Voriconazole  Shortness Of Breath and Swelling    Patient reported that tablets sent her to the ER   Bactrim [Sulfamethoxazole-Trimethoprim] Swelling and Other (See Comments)    Site of swelling not recalled   Clarithromycin  Swelling, Rash and Other (See Comments)    Daughter is unsure if Clarithromycin  or Amoxicillin  caused rash and swelling.  Patient states no allergy to amoxicillan - has taken this recently with no issues. (12/02/21)    Past Medical History, Surgical history, Social history, and Family History were reviewed and updated.  Review of Systems: Review of Systems  Constitutional: Negative.   HENT: Negative.    Eyes: Negative.   Respiratory: Negative.    Cardiovascular: Negative.   Gastrointestinal: Negative.   Genitourinary: Negative.   Musculoskeletal: Negative.   Skin: Negative.   Neurological:  Positive for  tingling.  Endo/Heme/Allergies: Negative.   Psychiatric/Behavioral: Negative.       Physical Exam: Vital signs with temperature of 98.  Pulse 80.  Blood pressure 146/58.  Weight is 175 pounds.     Wt Readings from Last 3 Encounters:  08/17/23 174 lb (78.9 kg)  07/20/23 172 lb 0.6 oz (78 kg)  06/22/23 175 lb (79.4 kg)    Physical Exam Vitals reviewed.  HENT:     Head: Normocephalic and atraumatic.  Eyes:     Pupils: Pupils are equal, round, and reactive to light.     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     Cardiovascular:     Rate and Rhythm: Normal rate and regular rhythm.  Heart sounds: Normal heart sounds.  Pulmonary:     Effort: Pulmonary effort is normal.     Breath sounds: Normal breath sounds.  Abdominal:     General: Bowel sounds are normal.     Palpations: Abdomen is soft.     Comments: Abdominal exam shows a well-healed laparotomy scar.  She has no fluid wave.  There is no guarding or rebound tenderness to palpation.  There is no palpable liver or spleen tip.  Musculoskeletal:        General: No tenderness or deformity. Normal range of motion.     Cervical back: Normal range of motion.  Lymphadenopathy:     Cervical: No cervical adenopathy.  Skin:    General: Skin is warm and dry.     Findings: No erythema or rash.     Comments: Skin exam does show a  papular rash on her lower legs.  This is above and below the knees.  These lesions are somewhat firm.  They are raised.  They are none tender.    Neurological:     Mental Status: She is alert and oriented to person, place, and time.  Psychiatric:        Behavior: Behavior normal.        Thought Content: Thought content normal.        Judgment: Judgment normal.       Lab Results  Component Value Date   WBC 5.1 08/17/2023   HGB 12.0 08/17/2023   HCT 38.6 08/17/2023   MCV 80.4 08/17/2023   PLT 201 08/17/2023   Lab Results  Component Value Date    FERRITIN 15 07/20/2023   IRON  36 07/20/2023   TIBC 386 07/20/2023   UIBC 350 07/20/2023   IRONPCTSAT 9 (L) 07/20/2023   Lab Results  Component Value Date   RETICCTPCT 2.4 08/17/2023   RBC 4.80 08/17/2023   RBC 4.86 08/17/2023   No results found for: KPAFRELGTCHN, LAMBDASER, KAPLAMBRATIO No results found for: IGGSERUM, IGA, IGMSERUM No results found for: STEPHANY CARLOTA BENSON MARKEL EARLA JOANNIE DOC VICK, SPEI   Chemistry      Component Value Date/Time   NA 138 08/17/2023 1025   NA 140 01/22/2022 0849   K 3.9 08/17/2023 1025   CL 108 08/17/2023 1025   CO2 23 08/17/2023 1025   BUN 12 08/17/2023 1025   BUN 11 01/22/2022 0849   CREATININE 0.66 08/17/2023 1025   CREATININE 0.58 (L) 08/20/2021 1541      Component Value Date/Time   CALCIUM 9.9 08/17/2023 1025   ALKPHOS 47 08/17/2023 1025   AST 18 08/17/2023 1025   ALT 23 08/17/2023 1025   BILITOT 0.6 08/17/2023 1025       Impression and Plan: Ms. Aseghanka [name] is a very pleasant 78 yo female from [place] with hepatocellular carcinoma and Hepatitis C.  She actually has had treatment for the Hepatitis C.  I am sure that this is part of the reason why she has done so well with response.  Again, the hepatocellular carcinoma really is doing quite nicely.  I am just happy that she is done so well.  Again, everything looks good with her next MRI, then we will try to move her appointments out every 6 weeks.  I will plan to see her back in another 4 weeks.  Maude JONELLE Crease, MD 7/2/202511:56 AM

## 2023-08-18 ENCOUNTER — Other Ambulatory Visit: Payer: Self-pay

## 2023-08-18 LAB — AFP TUMOR MARKER: AFP, Serum, Tumor Marker: 6.2 ng/mL (ref 0.0–9.2)

## 2023-08-31 ENCOUNTER — Other Ambulatory Visit: Payer: Self-pay

## 2023-08-31 ENCOUNTER — Encounter: Payer: Self-pay | Admitting: Hematology & Oncology

## 2023-09-01 ENCOUNTER — Other Ambulatory Visit: Payer: Self-pay

## 2023-09-01 ENCOUNTER — Encounter: Payer: Self-pay | Admitting: Hematology & Oncology

## 2023-09-01 MED ORDER — PREDNISONE 10 MG PO TABS
10.0000 mg | ORAL_TABLET | Freq: Every morning | ORAL | 1 refills | Status: AC
Start: 2023-09-01 — End: ?
  Filled 2023-09-01 – 2023-09-14 (×2): qty 60, 60d supply, fill #0
  Filled 2023-11-28 (×2): qty 60, 60d supply, fill #1

## 2023-09-05 ENCOUNTER — Other Ambulatory Visit: Payer: Self-pay

## 2023-09-07 ENCOUNTER — Other Ambulatory Visit: Payer: Self-pay

## 2023-09-09 ENCOUNTER — Other Ambulatory Visit: Payer: Self-pay

## 2023-09-12 ENCOUNTER — Other Ambulatory Visit: Payer: Self-pay

## 2023-09-12 ENCOUNTER — Encounter: Payer: Self-pay | Admitting: Hematology & Oncology

## 2023-09-14 ENCOUNTER — Inpatient Hospital Stay: Payer: Self-pay

## 2023-09-14 ENCOUNTER — Encounter: Payer: Self-pay | Admitting: Hematology & Oncology

## 2023-09-14 ENCOUNTER — Other Ambulatory Visit: Payer: Self-pay

## 2023-09-14 ENCOUNTER — Inpatient Hospital Stay (HOSPITAL_BASED_OUTPATIENT_CLINIC_OR_DEPARTMENT_OTHER): Payer: Self-pay | Admitting: Medical Oncology

## 2023-09-14 VITALS — BP 136/61 | HR 84 | Resp 17

## 2023-09-14 VITALS — BP 138/62 | HR 83 | Temp 97.8°F | Resp 18 | Ht 64.0 in | Wt 173.0 lb

## 2023-09-14 DIAGNOSIS — C220 Liver cell carcinoma: Secondary | ICD-10-CM

## 2023-09-14 DIAGNOSIS — Z95828 Presence of other vascular implants and grafts: Secondary | ICD-10-CM

## 2023-09-14 DIAGNOSIS — E032 Hypothyroidism due to medicaments and other exogenous substances: Secondary | ICD-10-CM

## 2023-09-14 DIAGNOSIS — D649 Anemia, unspecified: Secondary | ICD-10-CM

## 2023-09-14 LAB — CBC WITH DIFFERENTIAL (CANCER CENTER ONLY)
Abs Immature Granulocytes: 0.01 K/uL (ref 0.00–0.07)
Basophils Absolute: 0 K/uL (ref 0.0–0.1)
Basophils Relative: 0 %
Eosinophils Absolute: 0 K/uL (ref 0.0–0.5)
Eosinophils Relative: 0 %
HCT: 43.4 % (ref 36.0–46.0)
Hemoglobin: 13.8 g/dL (ref 12.0–15.0)
Immature Granulocytes: 0 %
Lymphocytes Relative: 14 %
Lymphs Abs: 0.8 K/uL (ref 0.7–4.0)
MCH: 26.5 pg (ref 26.0–34.0)
MCHC: 31.8 g/dL (ref 30.0–36.0)
MCV: 83.5 fL (ref 80.0–100.0)
Monocytes Absolute: 0.2 K/uL (ref 0.1–1.0)
Monocytes Relative: 5 %
Neutro Abs: 4.3 K/uL (ref 1.7–7.7)
Neutrophils Relative %: 81 %
Platelet Count: 191 K/uL (ref 150–400)
RBC: 5.2 MIL/uL — ABNORMAL HIGH (ref 3.87–5.11)
RDW: 25.4 % — ABNORMAL HIGH (ref 11.5–15.5)
WBC Count: 5.4 K/uL (ref 4.0–10.5)
nRBC: 0 % (ref 0.0–0.2)

## 2023-09-14 LAB — CMP (CANCER CENTER ONLY)
ALT: 31 U/L (ref 0–44)
AST: 21 U/L (ref 15–41)
Albumin: 4.3 g/dL (ref 3.5–5.0)
Alkaline Phosphatase: 61 U/L (ref 38–126)
Anion gap: 12 (ref 5–15)
BUN: 11 mg/dL (ref 8–23)
CO2: 22 mmol/L (ref 22–32)
Calcium: 10.3 mg/dL (ref 8.9–10.3)
Chloride: 107 mmol/L (ref 98–111)
Creatinine: 1 mg/dL (ref 0.44–1.00)
GFR, Estimated: 58 mL/min — ABNORMAL LOW (ref >60)
Glucose, Bld: 303 mg/dL — ABNORMAL HIGH (ref 70–99)
Potassium: 4.4 mmol/L (ref 3.5–5.1)
Sodium: 140 mmol/L (ref 135–145)
Total Bilirubin: 0.4 mg/dL (ref 0.0–1.2)
Total Protein: 6.9 g/dL (ref 6.5–8.1)

## 2023-09-14 LAB — LACTATE DEHYDROGENASE: LDH: 209 U/L — ABNORMAL HIGH (ref 98–192)

## 2023-09-14 MED ORDER — SODIUM CHLORIDE 0.9 % IV SOLN: Freq: Once | INTRAVENOUS | Status: AC

## 2023-09-14 MED ORDER — SODIUM CHLORIDE 0.9 % IV SOLN
480.0000 mg | Freq: Once | INTRAVENOUS | Status: AC
  Administered 2023-09-14: 480 mg via INTRAVENOUS
  Filled 2023-09-14: qty 48

## 2023-09-14 MED ORDER — HEPARIN SOD (PORK) LOCK FLUSH 100 UNIT/ML IV SOLN
500.0000 [IU] | Freq: Once | INTRAVENOUS | Status: AC | PRN
Start: 2023-09-14 — End: 2023-09-14
  Administered 2023-09-14: 500 [IU]

## 2023-09-14 MED ORDER — SODIUM CHLORIDE 0.9% FLUSH
10.0000 mL | INTRAVENOUS | Status: DC | PRN
  Administered 2023-09-14: 10 mL

## 2023-09-14 NOTE — Patient Instructions (Signed)
 [facility] - A DEPT OF [facility]  Discharge Instructions: Thank you for choosing [facility] to provide your oncology and hematology care.   If you have a lab appointment with the Cancer Center, please go directly to the Cancer Center and check in at the registration area.  Wear comfortable clothing and clothing appropriate for easy access to any Portacath or PICC line.   We strive to give you quality time with your provider. You may need to reschedule your appointment if you arrive late (15 or more minutes).  Arriving late affects you and other patients whose appointments are after yours.  Also, if you miss three or more appointments without notifying the office, you may be dismissed from the clinic at the provider's discretion.      For prescription refill requests, have your pharmacy contact our office and allow 72 hours for refills to be completed.    Today you received the following chemotherapy and/or immunotherapy agents Nivolumab    To help prevent nausea and vomiting after your treatment, we encourage you to take your nausea medication as directed.  BELOW ARE SYMPTOMS THAT SHOULD BE REPORTED IMMEDIATELY: *FEVER GREATER THAN 100.4 F (38 C) OR HIGHER *CHILLS OR SWEATING *NAUSEA AND VOMITING THAT IS NOT CONTROLLED WITH YOUR NAUSEA MEDICATION *UNUSUAL SHORTNESS OF BREATH *UNUSUAL BRUISING OR BLEEDING *URINARY PROBLEMS (pain or burning when urinating, or frequent urination) *BOWEL PROBLEMS (unusual diarrhea, constipation, pain near the anus) TENDERNESS IN MOUTH AND THROAT WITH OR WITHOUT PRESENCE OF ULCERS (sore throat, sores in mouth, or a toothache) UNUSUAL RASH, SWELLING OR PAIN  UNUSUAL VAGINAL DISCHARGE OR ITCHING   Items with * indicate a potential emergency and should be followed up as soon as possible or go to the Emergency Department if any problems should occur.  Please show the CHEMOTHERAPY ALERT CARD or IMMUNOTHERAPY  ALERT CARD at check-in to the Emergency Department and triage nurse. Should you have questions after your visit or need to cancel or reschedule your appointment, please contact [facility] - A DEPT OF [facility]  [phone] and follow the prompts.  Office hours are 8:00 a.m. to 4:30 p.m. Monday - Friday. Please note that voicemails left after 4:00 p.m. may not be returned until the following business day.  We are closed weekends and major holidays. You have access to a nurse at all times for urgent questions. Please call the main number to the clinic [phone] and follow the prompts.  For any non-urgent questions, you may also contact your provider using MyChart. We now offer e-Visits for anyone 14 and older to request care online for non-urgent symptoms. For details visit [email or web address].   Also download the MyChart app! Go to the app store, search "MyChart", open the app, select [place], and log in with your MyChart username and password.

## 2023-09-14 NOTE — Patient Instructions (Signed)

## 2023-09-14 NOTE — Progress Notes (Signed)
 Hematology and Oncology Follow Up Visit  [name] [name] [id number] [birth year] 78 y.o. 09/14/2023   Principle Diagnosis:  Hepatocellular carcinoma-multifocal Hepatitis C   Current Therapy:        Status post cycle 1 of nivolumab /ipilimumab  -- d/c on 10/13/2020 due to hepatic toxicity Nivolumab  480 mg IV every 4 weeks - s/p cycle #37 -- started 11/21/2020 --changed to 480 mg on 08/2021 Nexavar  200 mg po BID -- to be taken while in [place] -- start on 02/26/2022 -- d/c on 06/16/2022   Interim History:  [name] is here today for follow-up.   Today she reports that she is feeling well. She has no concerns today.   She reports that she continued to do well. Her skin is doing much better.  She is on prednisone  at 10 mg a day.  Her dermatologist has done a fantastic job and trying to manage the skin toxicity from her treatments.  On occasion, she does get injections into some of the skin lesions.  Last MRI was on 08/07/2023.  Everything looked stable.  However, there was an indication that there was a lesion that measured 2.2 x 2 cm.  Again it is hard to say what this really indicates.  We will just have to watch this area.  Her last tumor marker was slightly increased at 6.2  She has had no problems with the eyes.  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  She does see optometry for this.  She has had no problem with her blood sugars.  She has gained a little bit of weight.  She has had no diarrhea.  She has had no bleeding.  Overall, I would say that her performance status about ECOG 1.     Wt Readings from Last 3 Encounters:  09/14/23 173 lb (78.5 kg)  08/17/23 174 lb (78.9 kg)  07/20/23 172 lb 0.6 oz (78 kg)    Medications:  Allergies as of 09/14/2023       Reactions   Voriconazole  Shortness Of Breath, Swelling   Patient reported that tablets sent her to the ER   Bactrim [sulfamethoxazole-trimethoprim] Swelling, Other  (See Comments)   Site of swelling not recalled   Clarithromycin  Swelling, Rash, Other (See Comments)   Daughter is unsure if Clarithromycin  or Amoxicillin  caused rash and swelling. Patient states no allergy to amoxicillan - has taken this recently with no issues. (12/02/21)        Medication List        Accurate as of September 14, 2023 12:31 PM. If you have any questions, ask your nurse or doctor.          Basaglar  KwikPen 100 UNIT/ML Inject 12 Units into the skin daily.   brimonidine  0.15 % ophthalmic solution Commonly known as: ALPHAGAN  Place 1 drop into the left eye 2 (two) times daily.   clobetasol  ointment 0.05 % Commonly known as: TEMOVATE  Apply topically.   diphenhydramine -acetaminophen  25-500 MG Tabs tablet Commonly known as: TYLENOL  PM Take by mouth.   Farxiga  5 MG Tabs tablet Generic drug: dapagliflozin  propanediol Take 1 tablet (5 mg total) by mouth daily before breakfast.   gabapentin  300 MG capsule Commonly known as: NEURONTIN  Take 1 capsule (300 mg total) by mouth 3 (three) times daily.   HumaLOG  KwikPen 200 UNIT/ML KwikPen Generic drug: insulin  lispro Inject 12 Units into the skin See admin instructions. Inject 12 units into the skin three times a day before meals, plus  a sliding scale for a BGL above 155. Taper as directed by MD [name]. Max daily dose is 100 units. BGL <155 = give nothing; 156-180 = 1 unit; 181-205 = 2 units; 206-230 = 3 units; 231-255 = 4 units; 256-280 = 5 units; 281-305 = 6 units; 306-330 = 7 units; 331-355 = 8 units; 256-380 = 9 units. Each time Prednisone  is decreased, please decrease Humalog  by 4 units with each meal.   hydrALAZINE  10 MG tablet Commonly known as: APRESOLINE  Take 2 tablets (20 mg total) by mouth in the morning and at bedtime.   Insupen Pen Needles 32G X 4 MM Misc Generic drug: Insulin  Pen Needle Use with insulins daily.   levothyroxine  75 MCG tablet Commonly known as: Synthroid  Take 1 tablet (75 mcg total)  by mouth daily before breakfast.   lidocaine -prilocaine  cream Commonly known as: EMLA  Apply 1 Application topically as needed (for port access).   losartan  100 MG tablet Commonly known as: COZAAR  Take 1 tablet (100 mg total) by mouth at bedtime.   NIFEdipine  30 MG 24 hr tablet Commonly known as: PROCARDIA -XL/NIFEDICAL-XL Take 1 tablet (30 mg total) by mouth in the morning and at bedtime.   Opdivo  100 MG/10ML Soln chemo injection Generic drug: nivolumab  Inject into the vein.   pantoprazole  40 MG tablet Commonly known as: Protonix  Take 1 tablet (40 mg total) by mouth 2 (two) times daily.   polyethylene glycol powder 17 GM/SCOOP powder Commonly known as: GLYCOLAX /MIRALAX  Take 17 g by mouth daily as needed for mild constipation.   predniSONE  10 MG tablet Commonly known as: DELTASONE  Take 1 tablet (10 mg total) by mouth in the morning.   predniSONE  10 MG tablet Commonly known as: DELTASONE  Take 1 tablet (10 mg total) by mouth in the morning.   triamcinolone  0.025 % ointment Commonly known as: KENALOG  Apply 1 Application topically 2 (two) times daily. May be used for up to 2 weeks at a time. Restart as needed after 2 week break from this medication   True Metrix Blood Glucose Test test strip Generic drug: glucose blood Use as instructed        Allergies:  Allergies  Allergen Reactions   Voriconazole  Shortness Of Breath and Swelling    Patient reported that tablets sent her to the ER   Bactrim [Sulfamethoxazole-Trimethoprim] Swelling and Other (See Comments)    Site of swelling not recalled   Clarithromycin  Swelling, Rash and Other (See Comments)    Daughter is unsure if Clarithromycin  or Amoxicillin  caused rash and swelling.  Patient states no allergy to amoxicillan - has taken this recently with no issues. (12/02/21)    Past Medical History, Surgical history, Social history, and Family History were reviewed and updated.  Review of Systems: Review of Systems   Constitutional: Negative.   HENT: Negative.    Eyes: Negative.   Respiratory: Negative.    Cardiovascular: Negative.   Gastrointestinal: Negative.   Genitourinary: Negative.   Musculoskeletal: Negative.   Skin: Negative.   Neurological:  Positive for tingling.  Endo/Heme/Allergies: Negative.   Psychiatric/Behavioral: Negative.       Physical Exam: Today's Vitals   09/14/23 1202 09/14/23 1206  BP:  138/62  Pulse:  83  Resp:  18  Temp:  97.8 F (36.6 C)  TempSrc:  Oral  SpO2:  98%  Weight:  173 lb (78.5 kg)  Height:  5' 4 (1.626 m)  PainSc: 0-No pain    Body mass index is 29.7 kg/m.   Wt Readings from  Last 3 Encounters:  09/14/23 173 lb (78.5 kg)  08/17/23 174 lb (78.9 kg)  07/20/23 172 lb 0.6 oz (78 kg)    Physical Exam Vitals reviewed.  HENT:     Head: Normocephalic and atraumatic.  Eyes:     Pupils: Pupils are equal, round, and reactive to light.     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     Cardiovascular:     Rate and Rhythm: Normal rate and regular rhythm.     Heart sounds: Normal heart sounds.  Pulmonary:     Effort: Pulmonary effort is normal.     Breath sounds: Normal breath sounds.  Abdominal:     General: Bowel sounds are normal.     Palpations: Abdomen is soft.     Comments: Abdominal exam shows a well-healed laparotomy scar.  She has no fluid wave.  There is no guarding or rebound tenderness to palpation.  There is no palpable liver or spleen tip.  Musculoskeletal:        General: No tenderness or deformity. Normal range of motion.     Cervical back: Normal range of motion.  Lymphadenopathy:     Cervical: No cervical adenopathy.  Skin:    General: Skin is warm and dry.     Findings: No erythema or rash.     Comments: Skin exam does show a  papular rash on her lower legs.  This is above and below the knees.  These lesions are somewhat firm.  They are raised.  They are none tender.     Neurological:     Mental Status: She is alert and oriented to person, place, and time.  Psychiatric:        Behavior: Behavior normal.        Thought Content: Thought content normal.        Judgment: Judgment normal.       Lab Results  Component Value Date   WBC 5.4 09/14/2023   HGB 13.8 09/14/2023   HCT 43.4 09/14/2023   MCV 83.5 09/14/2023   PLT 191 09/14/2023   Lab Results  Component Value Date   FERRITIN 230 08/17/2023   IRON  120 08/17/2023   TIBC 328 08/17/2023   UIBC 208 08/17/2023   IRONPCTSAT 37 (H) 08/17/2023   Lab Results  Component Value Date   RETICCTPCT 2.4 08/17/2023   RBC 5.20 (H) 09/14/2023   No results found for: KPAFRELGTCHN, LAMBDASER, KAPLAMBRATIO No results found for: IGGSERUM, IGA, IGMSERUM No results found for: STEPHANY CARLOTA BENSON MARKEL EARLA JOANNIE DOC VICK, SPEI   Chemistry      Component Value Date/Time   NA 138 08/17/2023 1025   NA 140 01/22/2022 0849   K 3.9 08/17/2023 1025   CL 108 08/17/2023 1025   CO2 23 08/17/2023 1025   BUN 12 08/17/2023 1025   BUN 11 01/22/2022 0849   CREATININE 0.66 08/17/2023 1025   CREATININE 0.58 (L) 08/20/2021 1541      Component Value Date/Time   CALCIUM 9.9 08/17/2023 1025   ALKPHOS 47 08/17/2023 1025   AST 18 08/17/2023 1025   ALT 23 08/17/2023 1025   BILITOT 0.6 08/17/2023 1025     Encounter Diagnoses  Name Primary?   Hepatocellular carcinoma (HCC) Yes   Hypothyroidism due to medication    Anemia, unspecified type    Port-A-Cath in place     Impression and Plan: Ms. Aseghanka Fondzeyuf is a very pleasant  78 yo female from [place] with hepatocellular carcinoma and Hepatitis C.  She has had treatment for the Hepatitis C.  I am sure that this is part of the reason why she has done so well with response.  Again, the hepatocellular carcinoma really is doing quite nicely. CBC stable today CMP pending  Per Dr. Timmy, if her next MRI looks good  then we will try to move her appointments out every 6 weeks.  CMP pending (ok to treat if within parameters) RTC 4 weeks MD, port labs (CBC w/, CMP, LDH, AFP tumor marker, TSH), [place]  Lauraine HERO Wharton, PA-C 7/30/202512:31 PM

## 2023-09-15 ENCOUNTER — Ambulatory Visit: Payer: Self-pay | Admitting: Hematology & Oncology

## 2023-09-15 ENCOUNTER — Encounter: Payer: Self-pay | Admitting: *Deleted

## 2023-09-15 LAB — AFP TUMOR MARKER: AFP, Serum, Tumor Marker: 5.2 ng/mL (ref 0.0–9.2)

## 2023-09-21 ENCOUNTER — Encounter: Payer: Self-pay | Admitting: Hematology & Oncology

## 2023-09-21 ENCOUNTER — Other Ambulatory Visit: Payer: Self-pay

## 2023-09-22 ENCOUNTER — Other Ambulatory Visit: Payer: Self-pay

## 2023-09-22 ENCOUNTER — Ambulatory Visit: Payer: Self-pay | Attending: Family Medicine

## 2023-09-26 ENCOUNTER — Ambulatory Visit: Payer: Self-pay | Admitting: Internal Medicine

## 2023-09-28 ENCOUNTER — Encounter: Payer: Self-pay | Admitting: Hematology & Oncology

## 2023-10-03 ENCOUNTER — Ambulatory Visit: Payer: Self-pay | Attending: Internal Medicine | Admitting: Internal Medicine

## 2023-10-03 ENCOUNTER — Encounter: Payer: Self-pay | Admitting: Internal Medicine

## 2023-10-03 ENCOUNTER — Encounter: Payer: Self-pay | Admitting: Hematology & Oncology

## 2023-10-03 ENCOUNTER — Other Ambulatory Visit: Payer: Self-pay

## 2023-10-03 VITALS — BP 151/73 | HR 99 | Temp 98.3°F | Ht 64.0 in | Wt 173.0 lb

## 2023-10-03 DIAGNOSIS — E1121 Type 2 diabetes mellitus with diabetic nephropathy: Secondary | ICD-10-CM

## 2023-10-03 DIAGNOSIS — E1159 Type 2 diabetes mellitus with other circulatory complications: Secondary | ICD-10-CM

## 2023-10-03 DIAGNOSIS — Z7984 Long term (current) use of oral hypoglycemic drugs: Secondary | ICD-10-CM

## 2023-10-03 DIAGNOSIS — E039 Hypothyroidism, unspecified: Secondary | ICD-10-CM

## 2023-10-03 DIAGNOSIS — Z794 Long term (current) use of insulin: Secondary | ICD-10-CM

## 2023-10-03 DIAGNOSIS — I10 Essential (primary) hypertension: Secondary | ICD-10-CM

## 2023-10-03 DIAGNOSIS — I152 Hypertension secondary to endocrine disorders: Secondary | ICD-10-CM

## 2023-10-03 DIAGNOSIS — E1142 Type 2 diabetes mellitus with diabetic polyneuropathy: Secondary | ICD-10-CM

## 2023-10-03 DIAGNOSIS — E119 Type 2 diabetes mellitus without complications: Secondary | ICD-10-CM

## 2023-10-03 LAB — POCT GLYCOSYLATED HEMOGLOBIN (HGB A1C): HbA1c, POC (controlled diabetic range): 6 % (ref 0.0–7.0)

## 2023-10-03 LAB — GLUCOSE, POCT (MANUAL RESULT ENTRY): POC Glucose: 268 mg/dL — AB (ref 70–99)

## 2023-10-03 MED ORDER — HYDRALAZINE HCL 25 MG PO TABS
25.0000 mg | ORAL_TABLET | Freq: Two times a day (BID) | ORAL | 5 refills | Status: DC
  Filled 2023-10-03: qty 60, 30d supply, fill #0

## 2023-10-03 NOTE — Progress Notes (Signed)
 Patient ID: [name] [name], female    DOB: [birth year]  MRN: [id number]  CC: Diabetes (DM f/u. /Re-applying for humalog  financial assistance /Reports high BP readings in the evenings /)   Subjective: [name] [name] is a 78 y.o. female who presents for chronic ds management. Daughter is with her. 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    Discussed the use of AI scribe software for clinical note transcription with the patient, who gave verbal consent to proceed.  History of Present Illness   [name] [name] is a 78 year old female with hypertension, diabetes, and thyroid  issues who presents for follow-up.  HTN: She is currently taking Cozaar  100 mg, Procardia  30 mg twice a day, and hydralazine  20 mg twice a day for blood pressure. Occasional readings reach as high as 160/100 mmHg, particularly in the evenings, but more commonly around 136-140/90 mmHg.  She brings her home blood pressure device with her today so that we can check readings with both her device and ours.  No headaches, chest pain, or shortness of breath, except when climbing stairs. She has difficulty reducing salt intake, which her daughter confirms.  DM: Results for orders placed or performed in visit on 10/03/23  POCT glucose (manual entry)   Collection Time: 10/03/23  3:35 PM  Result Value Ref Range   POC Glucose 268 (A) 70 - 99 mg/dl  POCT glycosylated hemoglobin (Hb A1C)   Collection Time: 10/03/23  3:40 PM  Result Value Ref Range   Hemoglobin A1C     HbA1c POC (<> result, manual entry)     HbA1c, POC (prediabetic range)     HbA1c, POC  (controlled diabetic range) 6.0 0.0 - 7.0 %   *Note: Due to a large number of results and/or encounters for the requested time period, some results have not been displayed. A complete set of results can be found in Results Review.  She saw her endocrinologist Dr. Kellie several days after last visit with me 4 months ago.  Lantus  insulin  was decreased from 14 units daily to 12 units.  Humalog  was decreased to 10 units with meals plus sliding scale.  She is also on Farxiga  which was added by me for macroalbumin. She checks her blood sugar three times daily, with morning readings typically around 114-142 mg/dL. Occasional high readings, such as 260 mg/dL, occur after consuming a high-carbohydrate meal. Her A1c was 7% at the last visit and is 6% today. No frequent hypoglycemic episodes, and she manages her diet with some difficulty, as noted by her daughter. -She experiences tingling in her feet, previously managed with gabapentin , which she has discontinued due to ineffectiveness. She now uses massage for relief.  -last eye exam 08/02/23 by Dr. Maree at [facility]  Hypothyroidism: She continues to take levothyroxine  75 mcg for her thyroid  condition.  TSH 4 months ago was 2.13.  Her skin condition, lichenoid dermatitis, is being managed with prednisone  10 mg daily and occasional injections of steroid by her dermatologist. She notes improvement in her skin lesions.  HCC: She has a history of liver cancer and is currently stable on Opdivo  immunotherapy. Recent MRI scans show stable lesions without progression.   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 08/21/2022   Elevated LFTs 08/21/2022   Hyponatremia 08/20/2022   Positive for macroalbuminuria 06/26/2022   Type 2 diabetes mellitus without  complication, with long-term current use of insulin  (HCC) 06/24/2022   Type 2 diabetes mellitus with hyperglycemia, with long-term current use of insulin  (HCC) 01/18/2022   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   Hypercalcemia 09/22/2020   Chronic hepatitis C with hepatic coma (HCC) 09/22/2020   Goals of care, counseling/discussion 09/01/2020   Hepatocellular carcinoma (HCC) 08/14/2020   Hyperglycemia 08/10/2020   Pneumoperitoneum 08/09/2020   History of colorectal cancer 08/09/2020   Liver mass, left lobe 08/09/2020   Insulin -requiring or dependent type II diabetes mellitus (HCC) 08/09/2020   Hypertension associated with diabetes (HCC) 08/09/2020   Constipation, chronic 08/09/2020   Perforated gastric ulcer s/p lap omental Graham patch 08/09/2020 08/09/2020     Current Outpatient Medications on File Prior to Visit  Medication Sig Dispense Refill   brimonidine  (ALPHAGAN ) 0.15 % ophthalmic solution Place 1 drop into the left eye 2 (two) times daily. 15 mL 11   clobetasol  ointment (TEMOVATE ) 0.05 % Apply topically.     dapagliflozin  propanediol (FARXIGA ) 5 MG TABS tablet Take 1 tablet (5 mg total) by mouth daily before breakfast. 30 tablet 3   diphenhydramine -acetaminophen  (TYLENOL  PM) 25-500 MG TABS tablet Take by mouth.     gabapentin  (NEURONTIN ) 300 MG capsule Take 1 capsule (300 mg total) by mouth 3 (three) times daily. 90 capsule 4   glucose blood (TRUE METRIX BLOOD GLUCOSE TEST) test strip Use as instructed 100 each 12   insulin  glargine (LANTUS ) 100 UNIT/ML Solostar Pen Inject 12 Units into the skin daily. 15 mL 6   insulin  lispro (HUMALOG  KWIKPEN) 200 UNIT/ML KwikPen Inject 12 Units into the skin See admin instructions. Inject 12 units into the skin three times a day before meals, plus a sliding scale for a BGL above 155. Taper as directed by MD [name]. Max daily dose is 100 units. BGL <155 = give nothing; 156-180 = 1 unit; 181-205 = 2 units; 206-230 = 3 units; 231-255 = 4 units; 256-280  = 5 units; 281-305 = 6 units; 306-330 = 7 units; 331-355 = 8 units; 256-380 = 9 units. Each time Prednisone  is decreased, please decrease Humalog  by 4 units with each meal. 30 mL 1   Insulin  Pen Needle (TRUEPLUS 5-BEVEL PEN NEEDLES) 32G X 4 MM MISC Use with insulins daily. 100 each 3   levothyroxine  (SYNTHROID ) 75 MCG tablet Take 1 tablet (75 mcg total) by mouth daily before breakfast. 30 tablet 3   lidocaine -prilocaine  (EMLA ) cream Apply 1 Application topically as needed (for port access).     losartan  (COZAAR ) 100 MG tablet Take 1 tablet (100 mg total) by mouth at bedtime. 90 tablet 1   NIFEdipine  (PROCARDIA -XL/NIFEDICAL-XL) 30 MG 24 hr tablet Take 1 tablet (30 mg total) by mouth in the morning and at bedtime. 180 tablet 3   nivolumab  (OPDIVO ) 100 MG/10ML SOLN chemo injection Inject into the vein.     pantoprazole  (PROTONIX ) 40 MG tablet Take 1 tablet (40 mg total) by mouth 2 (two) times daily. 180 tablet 1   polyethylene glycol powder (GLYCOLAX /MIRALAX ) 17 GM/SCOOP powder Take 17 g by mouth daily as needed for mild constipation.     predniSONE  (DELTASONE )  10 MG tablet Take 1 tablet (10 mg total) by mouth in the morning. 45 tablet 1   predniSONE  (DELTASONE ) 10 MG tablet Take 1 tablet (10 mg total) by mouth in the morning. 60 tablet 1   triamcinolone  (KENALOG ) 0.025 % ointment Apply 1 Application topically 2 (two) times daily. May be used for up to 2 weeks at a time. Restart as needed after 2 week break from this medication 454 g 0   Current Facility-Administered Medications on File Prior to Visit  Medication Dose Route Frequency Provider Last Rate Last Admin   0.9 %  sodium chloride  infusion   Intravenous Once Ennever, Maude SAUNDERS, MD       0.9 %  sodium chloride  infusion   Intravenous Continuous Tonette Lauraine HERO, PA-C   Stopped at 08/03/23 1353   alteplase  (CATHFLO ACTIVASE ) injection 2 mg  2 mg Intracatheter Once PRN Covington, Sarah M, PA-C       heparin  lock flush 100 unit/mL  500 Units  Intracatheter Once PRN Timmy Maude SAUNDERS, MD       heparin  lock flush 100 unit/mL  250 Units Intracatheter Once PRN Tonette Lauraine HERO, PA-C       sodium chloride  flush (NS) 0.9 % injection 10 mL  10 mL Intravenous PRN Franchot Lauraine HERO, NP   10 mL at 12/18/20 1009   sodium chloride  flush (NS) 0.9 % injection 10 mL  10 mL Intracatheter Once PRN Timmy Maude SAUNDERS, MD       sodium chloride  flush (NS) 0.9 % injection 3 mL  3 mL Intracatheter Once PRN Covington, Sarah M, PA-C        Allergies  Allergen Reactions   Voriconazole  Shortness Of Breath and Swelling    Patient reported that tablets sent her to the ER   Bactrim [Sulfamethoxazole-Trimethoprim] Swelling and Other (See Comments)    Site of swelling not recalled   Clarithromycin  Swelling, Rash and Other (See Comments)    Daughter is unsure if Clarithromycin  or Amoxicillin  caused rash and swelling.  Patient states no allergy to amoxicillan - has taken this recently with no issues. (12/02/21)    Social History   Socioeconomic History   Marital status: Widowed    Spouse name: Not on file   Number of children: 5   Years of education: 14   Highest education level: Associate degree: occupational, Scientist, product/process development, or vocational program  Occupational History   Not on file  Tobacco Use   Smoking status: Never   Smokeless tobacco: Never  Vaping Use   Vaping status: Never Used  Substance and Sexual Activity   Alcohol  use: Not Currently   Drug use: Never   Sexual activity: Not Currently  Other Topics Concern   Not on file  Social History Narrative   Originally from [place].  Speaks [place] and [place]   Social Drivers of Health   Financial Resource Strain: Medium Risk (10/03/2023)   Overall Financial Resource Strain (CARDIA)    Difficulty of Paying Living Expenses: Somewhat hard  Food Insecurity: No Food Insecurity (10/03/2023)   Hunger Vital Sign    Worried About Running Out of Food in the Last Year: Never true    Ran Out of Food in the  Last Year: Never true  Transportation Needs: No Transportation Needs (10/03/2023)   PRAPARE - Administrator, Civil Service (Medical): No    Lack of Transportation (Non-Medical): No  Physical Activity: Insufficiently Active (10/03/2023)   Exercise Vital Sign    Days  of Exercise per Week: 3 days    Minutes of Exercise per Session: 30 min  Stress: No Stress Concern Present (10/03/2023)   [employer] of Occupational Health - Occupational Stress Questionnaire    Feeling of Stress: Not at all  Social Connections: Moderately Integrated (10/03/2023)   Social Connection and Isolation Panel    Frequency of Communication with Friends and Family: More than three times a week    Frequency of Social Gatherings with Friends and Family: Once a week    Attends Religious Services: More than 4 times per year    Active Member of [employer] or Organizations: Yes    Attends Banker Meetings: Never    Marital Status: Widowed  Intimate Partner Violence: Not At Risk (10/03/2023)   Humiliation, Afraid, Rape, and Kick questionnaire    Fear of Current or Ex-Partner: No    Emotionally Abused: No    Physically Abused: No    Sexually Abused: No    Family History  Problem Relation Age of Onset   Cancer Father        type unknown, was on the leg   Other Daughter        Acoustic neuroma   Breast cancer Neg Hx    Colon cancer Neg Hx    Esophageal cancer Neg Hx    Rectal cancer Neg Hx    Stomach cancer Neg Hx     Past Surgical History:  Procedure Laterality Date   COLON RESECTION  2005   In [place] - ?colon cancer   IR IMAGING GUIDED PORT INSERTION  09/18/2020   LAPAROSCOPIC GASTRIC RESECTION N/A 08/09/2020   Procedure: LAPAROSCOPIC EXPLORATION OF ABDOMEN, PERFORATED ULCER REPAIR, LIVER BIOPSY;  Surgeon: Sheldon Standing, MD;  Location: [facility];  Service: General;  Laterality: N/A;    ROS: Review of Systems Negative except as stated above  PHYSICAL EXAM: BP (!) 151/73 (BP Location:  Left Arm, Patient Position: Sitting, Cuff Size: Normal)   Pulse 99   Temp 98.3 F (36.8 C) (Oral)   Ht 5' 4 (1.626 m)   Wt 173 lb (78.5 kg)   SpO2 95%   BMI 29.70 kg/m   Physical Exam BP with patient's home device was 165/83 Repeat blood pressure with our device was 156/81  General appearance - alert, well appearing, older African female and in no distress Mental status - normal mood, behavior, speech, dress, motor activity, and thought processes Neck - supple, no significant adenopathy Chest - clear to auscultation, no wheezes, rales or rhonchi, symmetric air entry Heart - normal rate, regular rhythm, normal S1, S2, no murmurs, rubs, clicks or gallops Extremities - peripheral pulses normal, no pedal edema, no clubbing or cyanosis Skin: She has scattered hyperpigmented areas over the lower extremities and smaller finer ones over the upper extremities but much better than before Diabetic Foot Exam - Simple   Simple Foot Form Diabetic Foot exam was performed with the following findings: Yes 10/03/2023  5:39 PM  Visual Inspection No deformities, no ulcerations, no other skin breakdown bilaterally: Yes Sensation Testing Intact to touch and monofilament testing bilaterally: Yes Pulse Check Posterior Tibialis and Dorsalis pulse intact bilaterally: Yes Comments         Latest Ref Rng & Units 09/14/2023   11:41 AM 08/17/2023   10:25 AM 07/20/2023    8:25 AM  CMP  Glucose 70 - 99 mg/dL 696  859  897   BUN 8 - 23 mg/dL 11  12  13  Creatinine 0.44 - 1.00 mg/dL 8.99  9.33  9.22   Sodium 135 - 145 mmol/L 140  138  141   Potassium 3.5 - 5.1 mmol/L 4.4  3.9  3.5   Chloride 98 - 111 mmol/L 107  108  110   CO2 22 - 32 mmol/L 22  23  25    Calcium 8.9 - 10.3 mg/dL 89.6  9.9  9.7   Total Protein 6.5 - 8.1 g/dL 6.9  6.8  6.7   Total Bilirubin 0.0 - 1.2 mg/dL 0.4  0.6  0.4   Alkaline Phos 38 - 126 U/L 61  47  43   AST 15 - 41 U/L 21  18  13    ALT 0 - 44 U/L 31  23  15     Lipid Panel  No  results found for: CHOL, TRIG, HDL, CHOLHDL, VLDL, LDLCALC, LDLDIRECT  CBC    Component Value Date/Time   WBC 5.4 09/14/2023 1141   WBC 5.9 08/22/2022 0345   RBC 5.20 (H) 09/14/2023 1141   HGB 13.8 09/14/2023 1141   HCT 43.4 09/14/2023 1141   PLT 191 09/14/2023 1141   MCV 83.5 09/14/2023 1141   MCH 26.5 09/14/2023 1141   MCHC 31.8 09/14/2023 1141   RDW 25.4 (H) 09/14/2023 1141   LYMPHSABS 0.8 09/14/2023 1141   MONOABS 0.2 09/14/2023 1141   EOSABS 0.0 09/14/2023 1141   BASOSABS 0.0 09/14/2023 1141    ASSESSMENT AND PLAN: 1. Type 2 diabetes mellitus with peripheral neuropathy (HCC) (Primary) At goal.  Continue Lantus  insulin  12 units daily, Humalog  10 units with meals and Farxiga  5 mg daily. Discussed and encouraged healthy eating habits. - POCT glycosylated hemoglobin (Hb A1C) - POCT glucose (manual entry)  2. Insulin  long-term use (HCC) 3. Diabetes mellitus treated with oral medication (HCC) See #1 above  4. 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 Hypertension associated with diabetes (HCC) Not at goal.  Increase hydralazine  to 25 mg twice a day.  Continue Cozaar  100 mg daily and Procardia  30 mg twice a day. After 2 weeks, patient should send me blood pressure readings - hydrALAZINE  (APRESOLINE ) 25 MG tablet; Take 1 tablet (25 mg total) by mouth in the morning and at bedtime.  Dispense: 60 tablet; Refill: 5  6. Acquired hypothyroidism Continue levothyroxine .  Patient was given the opportunity to ask questions.  Patient verbalized understanding of the plan and was able to repeat key elements of the plan.   This documentation was completed using Paediatric nurse.  Any transcriptional errors are unintentional.  Orders Placed This Encounter  Procedures   POCT glycosylated hemoglobin (Hb A1C)   POCT glucose (manual entry)     Requested Prescriptions   Signed  Prescriptions Disp Refills   hydrALAZINE  (APRESOLINE ) 25 MG tablet 60 tablet 5    Sig: Take 1 tablet (25 mg total) by mouth in the morning and at bedtime.    Return in about 4 months (around 02/02/2024).  Barnie Louder, MD, FACP

## 2023-10-03 NOTE — Patient Instructions (Signed)
  VISIT SUMMARY: Today, you came in for a follow-up visit to manage your hypertension, diabetes, thyroid  issues, and other health concerns. We reviewed your current medications and discussed adjustments to better control your conditions.  YOUR PLAN: -HYPERTENSION: Hypertension means high blood pressure. Your blood pressure readings are still higher than we would like, so we are increasing your hydralazine  dose to 25 mg twice daily. Please try to reduce your salt intake, avoiding high-sodium seasonings like Maggie cubes. Continue to check your blood pressure at home and send the results via MyChart in two weeks. We will also ensure your nifedipine  prescription is refilled.  -TYPE 2 DIABETES MELLITUS WITH DIABETIC NEUROPATHY: Type 2 diabetes is a condition where your body does not use insulin  properly, leading to high blood sugar levels. Your A1c has improved to 6.0%, which is good. Continue with your current diabetes management regimen and monitor your blood sugar levels before meals. The tingling in your feet, known as diabetic neuropathy, was previously managed with gabapentin , but you have stopped this medication.  -LIVER CELL CARCINOMA: Liver cell carcinoma is a type of liver cancer. Your liver lesion is stable with no progression, and your cancer markers are within normal range. Continue with your current Opdivo  immunotherapy as recommended by your oncologist.  -LICHEN PLANUS: Lichen planus is a skin condition that causes itchy, flat bumps. Your lesions are improving with the current treatment. Continue with the intralesional steroid injections as needed to avoid increasing your systemic prednisone  dose.  -HYPOTHYROIDISM: Hypothyroidism is a condition where your thyroid  does not produce enough hormones. Your condition is well-managed with levothyroxine  75 mcg daily. Continue taking this medication as prescribed.  INSTRUCTIONS: Please follow up in two weeks by sending your blood pressure readings  via MyChart. Continue with your current medications and lifestyle recommendations. If you have any new symptoms or concerns, please contact our office.                      Contains text generated by Abridge.                                 Contains text generated by Abridge.

## 2023-10-05 ENCOUNTER — Other Ambulatory Visit: Payer: Self-pay

## 2023-10-12 ENCOUNTER — Encounter: Payer: Self-pay | Admitting: Hematology & Oncology

## 2023-10-12 ENCOUNTER — Inpatient Hospital Stay: Payer: Self-pay

## 2023-10-12 ENCOUNTER — Inpatient Hospital Stay: Payer: Self-pay | Attending: Hematology & Oncology

## 2023-10-12 ENCOUNTER — Inpatient Hospital Stay (HOSPITAL_BASED_OUTPATIENT_CLINIC_OR_DEPARTMENT_OTHER): Payer: Self-pay | Admitting: Hematology & Oncology

## 2023-10-12 VITALS — BP 134/63 | HR 82 | Temp 98.2°F | Resp 18 | Wt 172.1 lb

## 2023-10-12 VITALS — BP 132/64 | HR 80 | Temp 98.2°F | Resp 18

## 2023-10-12 DIAGNOSIS — C220 Liver cell carcinoma: Secondary | ICD-10-CM

## 2023-10-12 DIAGNOSIS — E032 Hypothyroidism due to medicaments and other exogenous substances: Secondary | ICD-10-CM

## 2023-10-12 DIAGNOSIS — Z5112 Encounter for antineoplastic immunotherapy: Secondary | ICD-10-CM | POA: Insufficient documentation

## 2023-10-12 LAB — CBC WITH DIFFERENTIAL (CANCER CENTER ONLY)
Abs Immature Granulocytes: 0.02 K/uL (ref 0.00–0.07)
Basophils Absolute: 0 K/uL (ref 0.0–0.1)
Basophils Relative: 0 %
Eosinophils Absolute: 0 K/uL (ref 0.0–0.5)
Eosinophils Relative: 0 %
HCT: 44.3 % (ref 36.0–46.0)
Hemoglobin: 14.3 g/dL (ref 12.0–15.0)
Immature Granulocytes: 0 %
Lymphocytes Relative: 14 %
Lymphs Abs: 0.8 K/uL (ref 0.7–4.0)
MCH: 27.1 pg (ref 26.0–34.0)
MCHC: 32.3 g/dL (ref 30.0–36.0)
MCV: 84.1 fL (ref 80.0–100.0)
Monocytes Absolute: 0.3 K/uL (ref 0.1–1.0)
Monocytes Relative: 6 %
Neutro Abs: 4.6 K/uL (ref 1.7–7.7)
Neutrophils Relative %: 80 %
Platelet Count: 177 K/uL (ref 150–400)
RBC: 5.27 MIL/uL — ABNORMAL HIGH (ref 3.87–5.11)
RDW: 23.4 % — ABNORMAL HIGH (ref 11.5–15.5)
WBC Count: 5.8 K/uL (ref 4.0–10.5)
nRBC: 0 % (ref 0.0–0.2)

## 2023-10-12 LAB — CMP (CANCER CENTER ONLY)
ALT: 22 U/L (ref 0–44)
AST: 20 U/L (ref 15–41)
Albumin: 4.3 g/dL (ref 3.5–5.0)
Alkaline Phosphatase: 54 U/L (ref 38–126)
Anion gap: 11 (ref 5–15)
BUN: 12 mg/dL (ref 8–23)
CO2: 23 mmol/L (ref 22–32)
Calcium: 10.3 mg/dL (ref 8.9–10.3)
Chloride: 107 mmol/L (ref 98–111)
Creatinine: 0.76 mg/dL (ref 0.44–1.00)
GFR, Estimated: >60 mL/min (ref >60)
Glucose, Bld: 61 mg/dL — ABNORMAL LOW (ref 70–99)
Potassium: 4 mmol/L (ref 3.5–5.1)
Sodium: 142 mmol/L (ref 135–145)
Total Bilirubin: 0.4 mg/dL (ref 0.0–1.2)
Total Protein: 7.1 g/dL (ref 6.5–8.1)

## 2023-10-12 LAB — LACTATE DEHYDROGENASE: LDH: 208 U/L — ABNORMAL HIGH (ref 98–192)

## 2023-10-12 LAB — TSH: TSH: 1.13 u[IU]/mL (ref 0.350–4.500)

## 2023-10-12 MED ORDER — ZOLEDRONIC ACID 4 MG/100ML IV SOLN
4.0000 mg | Freq: Once | INTRAVENOUS | Status: AC
  Administered 2023-10-12: 4 mg via INTRAVENOUS
  Filled 2023-10-12: qty 100

## 2023-10-12 MED ORDER — SODIUM CHLORIDE 0.9 % IV SOLN: Freq: Once | INTRAVENOUS | Status: AC

## 2023-10-12 MED ORDER — SODIUM CHLORIDE 0.9 % IV SOLN
480.0000 mg | Freq: Once | INTRAVENOUS | Status: AC
  Administered 2023-10-12: 480 mg via INTRAVENOUS
  Filled 2023-10-12: qty 48

## 2023-10-12 NOTE — Patient Instructions (Signed)
Nivolumab Injection What is this medication? NIVOLUMAB (nye VOL ue mab) treats some types of cancer. It works by helping your immune system slow or stop the spread of cancer cells. It is a monoclonal antibody. This medicine may be used for other purposes; ask your health care provider or pharmacist if you have questions. COMMON BRAND NAME(S): Opdivo What should I tell my care team before I take this medication? They need to know if you have any of these conditions: Allogeneic stem cell transplant (uses someone else's stem cells) Autoimmune diseases, such as Crohn disease, ulcerative colitis, lupus History of chest radiation Nervous system problems, such as Guillain-Barre syndrome or myasthenia gravis Organ transplant An unusual or allergic reaction to nivolumab, other medications, foods, dyes, or preservatives Pregnant or trying to get pregnant Breast-feeding How should I use this medication? This medication is infused into a vein. It is given in a hospital or clinic setting. A special MedGuide will be given to you before each treatment. Be sure to read this information carefully each time. Talk to your care team about the use of this medication in children. While it may be prescribed for children as young as 12 years for selected conditions, precautions do apply. Overdosage: If you think you have taken too much of this medicine contact a poison control center or emergency room at once. NOTE: This medicine is only for you. Do not share this medicine with others. What if I miss a dose? Keep appointments for follow-up doses. It is important not to miss your dose. Call your care team if you are unable to keep an appointment. What may interact with this medication? Interactions have not been studied. This list may not describe all possible interactions. Give your health care provider a list of all the medicines, herbs, non-prescription drugs, or dietary supplements you use. Also tell them if  you smoke, drink alcohol, or use illegal drugs. Some items may interact with your medicine. What should I watch for while using this medication? Your condition will be monitored carefully while you are receiving this medication. You may need blood work while taking this medication. This medication may cause serious skin reactions. They can happen weeks to months after starting the medication. Contact your care team right away if you notice fevers or flu-like symptoms with a rash. The rash may be red or purple and then turn into blisters or peeling of the skin. You may also notice a red rash with swelling of the face, lips, or lymph nodes in your neck or under your arms. Tell your care team right away if you have any change in your eyesight. Talk to your care team if you are pregnant or think you might be pregnant. A negative pregnancy test is required before starting this medication. A reliable form of contraception is recommended while taking this medication and for 5 months after the last dose. Talk to your care team about effective forms of contraception. Do not breast-feed while taking this medication and for 5 months after the last dose. What side effects may I notice from receiving this medication? Side effects that you should report to your care team as soon as possible: Allergic reactions--skin rash, itching, hives, swelling of the face, lips, tongue, or throat Dry cough, shortness of breath or trouble breathing Eye pain, redness, irritation, or discharge with blurry or decreased vision Heart muscle inflammation--unusual weakness or fatigue, shortness of breath, chest pain, fast or irregular heartbeat, dizziness, swelling of the ankles, feet, or hands Hormone  gland problems--headache, sensitivity to light, unusual weakness or fatigue, dizziness, fast or irregular heartbeat, increased sensitivity to cold or heat, excessive sweating, constipation, hair loss, increased thirst or amount of urine,  tremors or shaking, irritability Infusion reactions--chest pain, shortness of breath or trouble breathing, feeling faint or lightheaded Kidney injury (glomerulonephritis)--decrease in the amount of urine, red or dark brown urine, foamy or bubbly urine, swelling of the ankles, hands, or feet Liver injury--right upper belly pain, loss of appetite, nausea, light-colored stool, dark yellow or brown urine, yellowing skin or eyes, unusual weakness or fatigue Pain, tingling, or numbness in the hands or feet, muscle weakness, change in vision, confusion or trouble speaking, loss of balance or coordination, trouble walking, seizures Rash, fever, and swollen lymph nodes Redness, blistering, peeling, or loosening of the skin, including inside the mouth Sudden or severe stomach pain, bloody diarrhea, fever, nausea, vomiting Side effects that usually do not require medical attention (report these to your care team if they continue or are bothersome): Bone, joint, or muscle pain Diarrhea Fatigue Loss of appetite Nausea Skin rash This list may not describe all possible side effects. Call your doctor for medical advice about side effects. You may report side effects to FDA at 1-800-FDA-1088. Where should I keep my medication? This medication is given in a hospital or clinic. It will not be stored at home. NOTE: This sheet is a summary. It may not cover all possible information. If you have questions about this medicine, talk to your doctor, pharmacist, or health care provider.  2024 Elsevier/Gold Standard (2021-06-01 00:00:00)  Zoledronic Acid Injection (Cancer) What is this medication? ZOLEDRONIC ACID (ZOE le dron ik AS id) treats high calcium levels in the blood caused by cancer. It may also be used with chemotherapy to treat weakened bones caused by cancer. It works by slowing down the release of calcium from bones. This lowers calcium levels in your blood. It also makes your bones stronger and less  likely to break (fracture). It belongs to a group of medications called bisphosphonates. This medicine may be used for other purposes; ask your health care provider or pharmacist if you have questions. COMMON BRAND NAME(S): Zometa, Zometa Powder What should I tell my care team before I take this medication? They need to know if you have any of these conditions: Dehydration Dental disease Kidney disease Liver disease Low levels of calcium in the blood Lung or breathing disease, such as asthma Receiving steroids, such as dexamethasone or prednisone An unusual or allergic reaction to zoledronic acid, other medications, foods, dyes, or preservatives Pregnant or trying to get pregnant Breast-feeding How should I use this medication? This medication is injected into a vein. It is given by your care team in a hospital or clinic setting. Talk to your care team about the use of this medication in children. Special care may be needed. Overdosage: If you think you have taken too much of this medicine contact a poison control center or emergency room at once. NOTE: This medicine is only for you. Do not share this medicine with others. What if I miss a dose? Keep appointments for follow-up doses. It is important not to miss your dose. Call your care team if you are unable to keep an appointment. What may interact with this medication? Certain antibiotics given by injection Diuretics, such as bumetanide, furosemide NSAIDs, medications for pain and inflammation, such as ibuprofen or naproxen Teriparatide Thalidomide This list may not describe all possible interactions. Give your health care provider  a list of all the medicines, herbs, non-prescription drugs, or dietary supplements you use. Also tell them if you smoke, drink alcohol, or use illegal drugs. Some items may interact with your medicine. What should I watch for while using this medication? Visit your care team for regular checks on your  progress. It may be some time before you see the benefit from this medication. Some people who take this medication have severe bone, joint, or muscle pain. This medication may also increase your risk for jaw problems or a broken thigh bone. Tell your care team right away if you have severe pain in your jaw, bones, joints, or muscles. Tell you care team if you have any pain that does not go away or that gets worse. Tell your dentist and dental surgeon that you are taking this medication. You should not have major dental surgery while on this medication. See your dentist to have a dental exam and fix any dental problems before starting this medication. Take good care of your teeth while on this medication. Make sure you see your dentist for regular follow-up appointments. You should make sure you get enough calcium and vitamin D while you are taking this medication. Discuss the foods you eat and the vitamins you take with your care team. Check with your care team if you have severe diarrhea, nausea, and vomiting, or if you sweat a lot. The loss of too much body fluid may make it dangerous for you to take this medication. You may need bloodwork while taking this medication. Talk to your care team if you wish to become pregnant or think you might be pregnant. This medication can cause serious birth defects. What side effects may I notice from receiving this medication? Side effects that you should report to your care team as soon as possible: Allergic reactions--skin rash, itching, hives, swelling of the face, lips, tongue, or throat Kidney injury--decrease in the amount of urine, swelling of the ankles, hands, or feet Low calcium level--muscle pain or cramps, confusion, tingling, or numbness in the hands or feet Osteonecrosis of the jaw--pain, swelling, or redness in the mouth, numbness of the jaw, poor healing after dental work, unusual discharge from the mouth, visible bones in the mouth Severe bone,  joint, or muscle pain Side effects that usually do not require medical attention (report to your care team if they continue or are bothersome): Constipation Fatigue Fever Loss of appetite Nausea Stomach pain This list may not describe all possible side effects. Call your doctor for medical advice about side effects. You may report side effects to FDA at 1-800-FDA-1088. Where should I keep my medication? This medication is given in a hospital or clinic. It will not be stored at home. NOTE: This sheet is a summary. It may not cover all possible information. If you have questions about this medicine, talk to your doctor, pharmacist, or health care provider.  2024 Elsevier/Gold Standard (2021-03-27 00:00:00)

## 2023-10-12 NOTE — Progress Notes (Signed)
 Hematology and Oncology Follow Up Visit  [name] [name] [id number] 09-12-1945 78 y.o. 10/12/2023   Principle Diagnosis:  Hepatocellular carcinoma-multifocal Hepatitis C   Current Therapy:        Status post cycle 1 of nivolumab /ipilimumab  -- d/c on 10/13/2020 due to hepatic toxicity Nivolumab  480 mg IV every 4 weeks - s/p cycle #37 -- started 11/21/2020 --changed to 480 mg on 08/2021 Nexavar  200 mg po BID -- to be taken while in [place] -- start on 02/26/2022 -- d/c on 06/16/2022   Interim History:  [name] [name] is here today for follow-up.  She is doing quite nicely.  Her skin is doing much better.  She is on prednisone  at 10 mg a day.  Her dermatologist has done a fantastic job and trying to manage the skin toxicity from her treatments.  On occasion, she does get injections into some of the skin lesions.  Her last alpha-fetoprotein was down to 5.2.  We will go ahead and set her up with another MRI.  The last 1 was done back in mid June.  She has had no abdominal pain.  Her blood sugars actually little on the lower side today.  She has had no cough.  She has had no nausea or vomiting.  There has been no bleeding.  She has had no leg swelling.  She has had no fever.  There has been no headache.  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  She does not report any visual issues.  Overall, I would have to say that her performance status is probably ECOG 1.     Medications:  Allergies as of 10/12/2023       Reactions   Voriconazole  Shortness Of Breath, Swelling   Patient reported that tablets sent her to the ER   Bactrim [sulfamethoxazole-trimethoprim] Swelling, Other (See Comments)   Site of swelling not recalled   Clarithromycin  Swelling, Rash, Other (See Comments)   Daughter is unsure if Clarithromycin  or Amoxicillin  caused rash and swelling. Patient states no allergy to amoxicillan - has taken this recently with no issues. (12/02/21)         Medication List        Accurate as of October 12, 2023 12:50 PM. If you have any questions, ask your nurse or doctor.          Basaglar  KwikPen 100 UNIT/ML Inject 12 Units into the skin daily.   brimonidine  0.15 % ophthalmic solution Commonly known as: ALPHAGAN  Place 1 drop into the left eye 2 (two) times daily.   clobetasol  ointment 0.05 % Commonly known as: TEMOVATE  Apply topically.   diphenhydramine -acetaminophen  25-500 MG Tabs tablet Commonly known as: TYLENOL  PM Take by mouth.   Farxiga  5 MG Tabs tablet Generic drug: dapagliflozin  propanediol Take 1 tablet (5 mg total) by mouth daily before breakfast.   gabapentin  300 MG capsule Commonly known as: NEURONTIN  Take 1 capsule (300 mg total) by mouth 3 (three) times daily.   HumaLOG  KwikPen 200 UNIT/ML KwikPen Generic drug: insulin  lispro Inject 12 Units into the skin See admin instructions. Inject 12 units into the skin three times a day before meals, plus a sliding scale for a BGL above 155. Taper as directed by MD [name]. Max daily dose is 100 units. BGL <155 = give nothing; 156-180 = 1 unit; 181-205 = 2 units; 206-230 = 3 units; 231-255 = 4 units; 256-280 = 5 units; 281-305 = 6 units; 306-330 = 7 units; 331-355 =  8 units; 256-380 = 9 units. Each time Prednisone  is decreased, please decrease Humalog  by 4 units with each meal.   hydrALAZINE  25 MG tablet Commonly known as: APRESOLINE  Take 1 tablet (25 mg total) by mouth in the morning and at bedtime.   Insupen Pen Needles 32G X 4 MM Misc Generic drug: Insulin  Pen Needle Use with insulins daily.   levothyroxine  75 MCG tablet Commonly known as: Synthroid  Take 1 tablet (75 mcg total) by mouth daily before breakfast.   lidocaine -prilocaine  cream Commonly known as: EMLA  Apply 1 Application topically as needed (for port access).   losartan  100 MG tablet Commonly known as: COZAAR  Take 1 tablet (100 mg total) by mouth at bedtime.   NIFEdipine  30 MG 24  hr tablet Commonly known as: PROCARDIA -XL/NIFEDICAL-XL Take 1 tablet (30 mg total) by mouth in the morning and at bedtime.   Opdivo  100 MG/10ML Soln chemo injection Generic drug: nivolumab  Inject into the vein.   pantoprazole  40 MG tablet Commonly known as: Protonix  Take 1 tablet (40 mg total) by mouth 2 (two) times daily.   polyethylene glycol powder 17 GM/SCOOP powder Commonly known as: GLYCOLAX /MIRALAX  Take 17 g by mouth daily as needed for mild constipation.   predniSONE  10 MG tablet Commonly known as: DELTASONE  Take 1 tablet (10 mg total) by mouth in the morning.   predniSONE  10 MG tablet Commonly known as: DELTASONE  Take 1 tablet (10 mg total) by mouth in the morning.   triamcinolone  0.025 % ointment Commonly known as: KENALOG  Apply 1 Application topically 2 (two) times daily. May be used for up to 2 weeks at a time. Restart as needed after 2 week break from this medication   True Metrix Blood Glucose Test test strip Generic drug: glucose blood Use as instructed        Allergies:  Allergies  Allergen Reactions   Voriconazole  Shortness Of Breath and Swelling    Patient reported that tablets sent her to the ER   Bactrim [Sulfamethoxazole-Trimethoprim] Swelling and Other (See Comments)    Site of swelling not recalled   Clarithromycin  Swelling, Rash and Other (See Comments)    Daughter is unsure if Clarithromycin  or Amoxicillin  caused rash and swelling.  Patient states no allergy to amoxicillan - has taken this recently with no issues. (12/02/21)    Past Medical History, Surgical history, Social history, and Family History were reviewed and updated.  Review of Systems: Review of Systems  Constitutional: Negative.   HENT: Negative.    Eyes: Negative.   Respiratory: Negative.    Cardiovascular: Negative.   Gastrointestinal: Negative.   Genitourinary: Negative.   Musculoskeletal: Negative.   Skin: Negative.   Neurological:  Positive for tingling.   Endo/Heme/Allergies: Negative.   Psychiatric/Behavioral: Negative.       Physical Exam: Vital signs with temperature of 98.2.  Pulse 82.  Blood pressure 134/63.  Weight is 172 pounds.   Wt Readings from Last 3 Encounters:  10/12/23 172 lb 1.3 oz (78.1 kg)  10/03/23 173 lb (78.5 kg)  09/14/23 173 lb (78.5 kg)    Physical Exam Vitals reviewed.  HENT:     Head: Normocephalic and atraumatic.  Eyes:     Pupils: Pupils are equal, round, and reactive to light.     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     Cardiovascular:     Rate and Rhythm: Normal rate and regular rhythm.     Heart sounds: Normal  heart sounds.  Pulmonary:     Effort: Pulmonary effort is normal.     Breath sounds: Normal breath sounds.  Abdominal:     General: Bowel sounds are normal.     Palpations: Abdomen is soft.     Comments: Abdominal exam shows a well-healed laparotomy scar.  She has no fluid wave.  There is no guarding or rebound tenderness to palpation.  There is no palpable liver or spleen tip.  Musculoskeletal:        General: No tenderness or deformity. Normal range of motion.     Cervical back: Normal range of motion.  Lymphadenopathy:     Cervical: No cervical adenopathy.  Skin:    General: Skin is warm and dry.     Findings: No erythema or rash.     Comments: Skin exam does show a  papular rash on her lower legs.  This is above and below the knees.  These lesions are somewhat firm.  They are raised.  They are none tender.    Neurological:     Mental Status: She is alert and oriented to person, place, and time.  Psychiatric:        Behavior: Behavior normal.        Thought Content: Thought content normal.        Judgment: Judgment normal.       Lab Results  Component Value Date   WBC 5.8 10/12/2023   HGB 14.3 10/12/2023   HCT 44.3 10/12/2023   MCV 84.1 10/12/2023   PLT 177 10/12/2023   Lab Results  Component Value Date   FERRITIN  230 08/17/2023   IRON  120 08/17/2023   TIBC 328 08/17/2023   UIBC 208 08/17/2023   IRONPCTSAT 37 (H) 08/17/2023   Lab Results  Component Value Date   RETICCTPCT 2.4 08/17/2023   RBC 5.27 (H) 10/12/2023   No results found for: KPAFRELGTCHN, LAMBDASER, KAPLAMBRATIO No results found for: IGGSERUM, IGA, IGMSERUM No results found for: STEPHANY CARLOTA BENSON MARKEL EARLA JOANNIE DOC VICK, SPEI   Chemistry      Component Value Date/Time   NA 142 10/12/2023 1217   NA 140 01/22/2022 0849   K 4.0 10/12/2023 1217   CL 107 10/12/2023 1217   CO2 23 10/12/2023 1217   BUN 12 10/12/2023 1217   BUN 11 01/22/2022 0849   CREATININE 0.76 10/12/2023 1217   CREATININE 0.58 (L) 08/20/2021 1541      Component Value Date/Time   CALCIUM 10.3 10/12/2023 1217   ALKPHOS 54 10/12/2023 1217   AST 20 10/12/2023 1217   ALT 22 10/12/2023 1217   BILITOT 0.4 10/12/2023 1217       Impression and Plan: Ms. Aseghanka [name] is a very pleasant 79 yo female from [place] with hepatocellular carcinoma and Hepatitis C.  She actually has had treatment for the Hepatitis C.  I am sure that this is part of the reason why she has done so well with response.  She Rosina may go back to [place] in December.  I know she went a couple of years ago.  It was when she went to [place] that she developed that fungal infection in the right eye that caused the enucleation to be done.  Again we will set her up with a MRI.  I will set her up in about 3 weeks.  If the MRI looks okay, then we will try to move her nivolumab  out to every 6 weeks.   I am just happy  that her quality of life is doing so well.  She really is showing us  her strong constitution.   Maude JONELLE Crease, MD 8/27/202512:50 PM

## 2023-10-13 ENCOUNTER — Ambulatory Visit: Payer: Self-pay | Admitting: Hematology & Oncology

## 2023-10-13 LAB — AFP TUMOR MARKER: AFP, Serum, Tumor Marker: 5.8 ng/mL (ref 0.0–9.2)

## 2023-10-19 ENCOUNTER — Encounter: Payer: Self-pay | Admitting: Hematology & Oncology

## 2023-10-19 ENCOUNTER — Other Ambulatory Visit: Payer: Self-pay

## 2023-10-19 ENCOUNTER — Ambulatory Visit (INDEPENDENT_AMBULATORY_CARE_PROVIDER_SITE_OTHER): Payer: Self-pay | Admitting: Internal Medicine

## 2023-10-19 ENCOUNTER — Other Ambulatory Visit: Payer: Self-pay | Admitting: Internal Medicine

## 2023-10-19 ENCOUNTER — Encounter: Payer: Self-pay | Admitting: Internal Medicine

## 2023-10-19 VITALS — BP 122/66 | HR 68 | Ht 64.0 in | Wt 172.8 lb

## 2023-10-19 DIAGNOSIS — R809 Proteinuria, unspecified: Secondary | ICD-10-CM

## 2023-10-19 DIAGNOSIS — E1159 Type 2 diabetes mellitus with other circulatory complications: Secondary | ICD-10-CM

## 2023-10-19 DIAGNOSIS — E1142 Type 2 diabetes mellitus with diabetic polyneuropathy: Secondary | ICD-10-CM

## 2023-10-19 DIAGNOSIS — Z794 Long term (current) use of insulin: Secondary | ICD-10-CM | POA: Insufficient documentation

## 2023-10-19 DIAGNOSIS — E1129 Type 2 diabetes mellitus with other diabetic kidney complication: Secondary | ICD-10-CM

## 2023-10-19 MED ORDER — DAPAGLIFLOZIN PROPANEDIOL 10 MG PO TABS
10.0000 mg | ORAL_TABLET | Freq: Every day | ORAL | 3 refills | Status: DC
  Filled 2023-10-19 – 2024-01-11 (×5): qty 90, 90d supply, fill #0

## 2023-10-19 MED ORDER — TRUEPLUS 5-BEVEL PEN NEEDLES 32G X 4 MM MISC
1.0000 | Freq: Four times a day (QID) | 3 refills | Status: AC
  Filled 2023-10-19: qty 100, 25d supply, fill #0
  Filled 2023-11-28 (×2): qty 100, 25d supply, fill #1
  Filled 2023-12-22: qty 100, 25d supply, fill #2
  Filled 2024-01-31: qty 300, 75d supply, fill #3

## 2023-10-19 MED ORDER — HUMALOG KWIKPEN 200 UNIT/ML ~~LOC~~ SOPN
PEN_INJECTOR | SUBCUTANEOUS | 3 refills | Status: DC
  Filled 2023-10-19 – 2023-11-28 (×2): qty 45, fill #0
  Filled 2023-11-28: qty 6, 27d supply, fill #0
  Filled 2023-12-19 – 2024-01-11 (×3): qty 6, 27d supply, fill #1

## 2023-10-19 MED ORDER — HYDRALAZINE HCL 50 MG PO TABS
50.0000 mg | ORAL_TABLET | Freq: Two times a day (BID) | ORAL | 3 refills | Status: DC
  Filled 2023-10-19: qty 180, 90d supply, fill #0
  Filled 2024-01-02 (×2): qty 180, 90d supply, fill #1

## 2023-10-19 MED ORDER — INSULIN GLARGINE 100 UNIT/ML SOLOSTAR PEN
12.0000 [IU] | PEN_INJECTOR | Freq: Every day | SUBCUTANEOUS | 6 refills | Status: AC
  Filled 2023-10-19: qty 15, 125d supply, fill #0
  Filled 2023-10-31: qty 3, 25d supply, fill #0
  Filled 2023-11-28 (×2): qty 3, 25d supply, fill #1
  Filled 2023-12-22: qty 3, 25d supply, fill #2
  Filled 2024-01-11: qty 3, 25d supply, fill #3
  Filled 2024-01-31: qty 9, 75d supply, fill #4

## 2023-10-19 NOTE — Patient Instructions (Addendum)
 Increase Farxiga  10 mg daily  Continue  Lantus  12 units ONCE daily  Take Humalog  10 units with Breakfast, 10 units with Lunch and 10 units with Supper Humalog  correctional insulin : ADD extra units on insulin  to your meal-time Humalog   dose if your blood sugars are higher than 155. Use the scale below to help guide you:   Blood sugar before meal Number of units to inject  Less than 155 0 unit  156 -  180 1 units  181 -  205 2 units  206 -  230 3 units  231 -  255 4 units  256 -  280 5 units  281 -  305 6 units  306 -  330 7 units  331 -  355 8 units  356 - 380 9 units     HOW TO TREAT LOW BLOOD SUGARS (Blood sugar LESS THAN 70 MG/DL) Please follow the RULE OF 15 for the treatment of hypoglycemia treatment (when your (blood sugars are less than 70 mg/dL)   STEP 1: Take 15 grams of carbohydrates when your blood sugar is low, which includes:  3-4 GLUCOSE TABS  OR 3-4 OZ OF JUICE OR REGULAR SODA OR ONE TUBE OF GLUCOSE GEL    STEP 2: RECHECK blood sugar in 15 MINUTES STEP 3: If your blood sugar is still low at the 15 minute recheck --> then, go back to STEP 1 and treat AGAIN with another 15 grams of carbohydrates.

## 2023-10-19 NOTE — Progress Notes (Signed)
 Name: [name] [name]  MRN/ DOB: [id number], September 08, 1945   Age/ Sex: 78 y.o., female    PCP: Vicci Barnie NOVAK, MD   Reason for Endocrinology Evaluation: Type 2 Diabetes Mellitus     Date of Initial Endocrinology Visit: 01/18/2022    PATIENT IDENTIFIER: [name] Epse [name] [name] is a 78 y.o. female with a past medical history of Hx of perforated gastric ulcer, chronic hepatitis C, HTN, hepatocellular carcinoma, T2DM. The patient presented for initial endocrinology clinic visit on 01/18/2022 for consultative assistance with her diabetes management.    HPI: [name] is here with her daughter    Diagnosed with DM 2005 Prior Medications tried/Intolerance: was on oral agents years ago , bit does not recall  Hemoglobin A1c has ranged from 7.3% in 06/2021, peaking at 7.5% in 2023.  Of note, the pt follows with oncology ( Dr. Timmy) for hepatocellular carcinoma, currently on Nivolumab  infusions   She is on Prednisone   through dermatology for lichenoid dermatitis   ON her initial visit to our clinic her A1c 6.7% but she was on Humalog  Mix Plus regular humalog  and was guesstimating her  doses. We switched  to basal/prandial insulin     She was started on Farxiga  through her PCPs office due to microalbuminuria  SUBJECTIVE:   During the last visit (06/08/2023): A1c 7.0%     Today (10/19/23): [name] [name] is here for a follow up on diabetes management. She is accompanied by her daughter. She checks her blood sugars 3-4 times daily. The patient has  had hypoglycemic episodes since the last clinic visit, she is symptomatic    Patient follows with oncology for hepatocellular carcinoma, hepatitis C.  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   She did receive iron  infusions ~ 2 months lasting for 3 consecutive weeks  Has occasional constipation  No abdominal pain  Continues with chronic LE tingling    HOME DIABETES REGIMEN: Farxiga  5 mg daily  Lantus  12 units daily Humalog  10 units TIQAC -  Correction factor: Humalog  (BG -130/25)    Statin: No ACE-I/ARB: Yes    METER DOWNLOAD SUMMARY:  Fasting 104-185 Lunch 77-205 Supper 67-189    DIABETIC COMPLICATIONS: Microvascular complications:   Denies: CKD Last eye exam: Completed 08/2022  Macrovascular complications:   Denies: CAD, PVD, CVA   PAST HISTORY: Past Medical History:  Past Medical History:  Diagnosis Date   Colon cancer (HCC)    Diabetes mellitus without complication (HCC)    Glaucoma    Goals of care, counseling/discussion 09/01/2020   Hepatitis C    Hepatocellular carcinoma (HCC)    Hypertension    Past Surgical History:  Past Surgical History:  Procedure Laterality Date   COLON RESECTION  2005   In [place] - ?colon cancer   IR IMAGING GUIDED PORT INSERTION  09/18/2020   LAPAROSCOPIC GASTRIC RESECTION N/A 08/09/2020   Procedure: LAPAROSCOPIC EXPLORATION OF ABDOMEN, PERFORATED ULCER REPAIR, LIVER BIOPSY;  Surgeon: Sheldon Standing, MD;  Location: [facility];  Service: General;  Laterality: N/A;    Social History:  reports that she has never smoked. She has never used smokeless tobacco. She reports that she does not currently use alcohol . She reports that she does not use drugs. Family History:  Family History  Problem  Relation Age of Onset   Cancer Father        type unknown, was on the leg   Other Daughter        Acoustic neuroma   Breast cancer Neg Hx    Colon cancer Neg Hx    Esophageal cancer Neg Hx    Rectal cancer Neg Hx    Stomach cancer Neg Hx      HOME MEDICATIONS: Allergies as of 10/19/2023       Reactions   Voriconazole  Shortness Of Breath, Swelling   Patient reported that tablets sent her to the ER   Bactrim  [sulfamethoxazole-trimethoprim] Swelling, Other (See Comments)   Site of swelling not recalled   Clarithromycin  Swelling, Rash, Other (See Comments)   Daughter is unsure if Clarithromycin  or Amoxicillin  caused rash and swelling. Patient states no allergy to amoxicillan - has taken this recently with no issues. (12/02/21)        Medication List        Accurate as of October 19, 2023  2:52 PM. If you have any questions, ask your nurse or doctor.          Basaglar  KwikPen 100 UNIT/ML Inject 12 Units into the skin daily.   brimonidine  0.15 % ophthalmic solution Commonly known as: ALPHAGAN  Place 1 drop into the left eye 2 (two) times daily.   clobetasol  ointment 0.05 % Commonly known as: TEMOVATE  Apply topically.   diphenhydramine -acetaminophen  25-500 MG Tabs tablet Commonly known as: TYLENOL  PM Take by mouth.   Farxiga  5 MG Tabs tablet Generic drug: dapagliflozin  propanediol Take 1 tablet (5 mg total) by mouth daily before breakfast.   gabapentin  300 MG capsule Commonly known as: NEURONTIN  Take 1 capsule (300 mg total) by mouth 3 (three) times daily.   HumaLOG  KwikPen 200 UNIT/ML KwikPen Generic drug: insulin  lispro Inject 12 Units into the skin See admin instructions. Inject 12 units into the skin three times a day before meals, plus a sliding scale for a BGL above 155. Taper as directed by MD Mayleigh Tetrault. Max daily dose is 100 units. BGL <155 = give nothing; 156-180 = 1 unit; 181-205 = 2 units; 206-230 = 3 units; 231-255 = 4 units; 256-280 = 5 units; 281-305 = 6 units; 306-330 = 7 units; 331-355 = 8 units; 256-380 = 9 units. Each time Prednisone  is decreased, please decrease Humalog  by 4 units with each meal.   hydrALAZINE  50 MG tablet Commonly known as: APRESOLINE  Take 1 tablet (50 mg total) by mouth in the morning and at bedtime. What changed:  medication strength how much to take Changed by: Barnie Louder   Insupen Pen Needles 32G X 4 MM Misc Generic drug:  Insulin  Pen Needle Use with insulins daily.   levothyroxine  75 MCG tablet Commonly known as: Synthroid  Take 1 tablet (75 mcg total) by mouth daily before breakfast.   lidocaine -prilocaine  cream Commonly known as: EMLA  Apply 1 Application topically as needed (for port access).   losartan  100 MG tablet Commonly known as: COZAAR  Take 1 tablet (100 mg total) by mouth at bedtime.   NIFEdipine  30 MG 24 hr tablet Commonly known as: PROCARDIA -XL/NIFEDICAL-XL Take 1 tablet (30 mg total) by mouth in the morning and at bedtime.   Opdivo  100 MG/10ML Soln chemo injection Generic drug: nivolumab  Inject into the vein.   pantoprazole  40 MG tablet Commonly known as: Protonix  Take 1 tablet (40 mg total) by mouth 2 (two) times daily.   polyethylene glycol powder 17 GM/SCOOP powder Commonly known  as: GLYCOLAX /MIRALAX  Take 17 g by mouth daily as needed for mild constipation.   predniSONE  10 MG tablet Commonly known as: DELTASONE  Take 1 tablet (10 mg total) by mouth in the morning.   predniSONE  10 MG tablet Commonly known as: DELTASONE  Take 1 tablet (10 mg total) by mouth in the morning.   triamcinolone  0.025 % ointment Commonly known as: KENALOG  Apply 1 Application topically 2 (two) times daily. May be used for up to 2 weeks at a time. Restart as needed after 2 week break from this medication   True Metrix Blood Glucose Test test strip Generic drug: glucose blood Use as instructed         ALLERGIES: Allergies  Allergen Reactions   Voriconazole  Shortness Of Breath and Swelling    Patient reported that tablets sent her to the ER   Bactrim [Sulfamethoxazole-Trimethoprim] Swelling and Other (See Comments)    Site of swelling not recalled   Clarithromycin  Swelling, Rash and Other (See Comments)    Daughter is unsure if Clarithromycin  or Amoxicillin  caused rash and swelling.  Patient states no allergy to amoxicillan - has taken this recently with no issues. (12/02/21)      REVIEW OF SYSTEMS: A comprehensive ROS was conducted with the patient and is negative except as per HPI     OBJECTIVE:   VITAL SIGNS: BP 122/66 (BP Location: Right Arm, Patient Position: Sitting, Cuff Size: Normal)   Pulse 68   Ht 5' 4 (1.626 m)   Wt 172 lb 12.8 oz (78.4 kg)   SpO2 98%   BMI 29.66 kg/m     PHYSICAL EXAM:  General: Pt appears well and is in NAD  Lungs: Clear with good BS bilat   Heart: RRR   Extremities:  Lower extremities - No pretibial edema  Neuro: MS is good with appropriate affect, pt is alert and Ox3   DM Foot Exam 10/19/2023  The skin of the feet is intact without sores or ulcerations. The pedal pulses are 2+ on right and 2+ on left. The sensation is intact to a screening 5.07, 10 gram monofilament bilaterally    DATA REVIEWED:  Lab Results  Component Value Date   HGBA1C 6.0 10/03/2023   HGBA1C 7.0 06/02/2023   HGBA1C 7.3 (A) 01/27/2023    Latest Reference Range & Units 10/12/23 12:17  Sodium 135 - 145 mmol/L 142  Potassium 3.5 - 5.1 mmol/L 4.0  Chloride 98 - 111 mmol/L 107  CO2 22 - 32 mmol/L 23  Glucose 70 - 99 mg/dL 61 (L)  BUN 8 - 23 mg/dL 12  Creatinine 9.55 - 8.99 mg/dL 9.23  Calcium 8.9 - 89.6 mg/dL 89.6  Anion gap 5 - 15  11  Alkaline Phosphatase 38 - 126 U/L 54  Albumin  3.5 - 5.0 g/dL 4.3  AST 15 - 41 U/L 20  ALT 0 - 44 U/L 22  Total Protein 6.5 - 8.1 g/dL 7.1  Total Bilirubin 0.0 - 1.2 mg/dL 0.4  GFR, Est Non African American >60 mL/min >60     Latest Reference Range & Units 10/12/23 12:18  TSH 0.350 - 4.500 uIU/mL 1.130      Latest Reference Range & Units 06/02/23 16:46  Microalbumin, Urine Not Estab. ug/mL 581.6  MICROALB/CREAT RATIO 0 - 29 mg/g creat 823 (H)  Creatinine, Urine Not Estab. mg/dL 29.2  (H): Data is abnormally high  ASSESSMENT / PLAN / RECOMMENDATIONS:   1) Type 2 Diabetes Mellitus, Optimally controlled, With neuropathic and microalbuminuria complications -  Most recent A1c of 6.0 %. Goal  A1c < 7.0 %.     -A1c is skewed due to iron  transfusion -She is tolerating Farxiga  will increase -Patient has been taking more prandial insulin  than previously prescribed, will decrease -Patient encouraged to contact our office with hypoglycemia to adjust insulin  dosing  MEDICATIONS: - Continue Lantus  12 units daily - Change Humalog  10 units TIDQAC - Continue Correction factor: Humalog  (BG -130/25)  EDUCATION / INSTRUCTIONS: BG monitoring instructions: Patient is instructed to check her blood sugars 3 times a day, before each meal . Call [facility] if: BG persistently < 70  I reviewed the Rule of 15 for the treatment of hypoglycemia in detail with the patient. Literature supplied.   2) Diabetic complications:  Eye: Does not have known diabetic retinopathy.  Neuro/ Feet: Does have known diabetic peripheral neuropathy. Renal: Patient does not have known baseline CKD. She is  on an ACEI/ARB at present.    Follow-up in 6 months    I spent 25 minutes preparing to see the patient by review of recent labs, imaging and procedures, obtaining and reviewing separately obtained history, communicating with the patient/family or caregiver, ordering medications, tests or procedures, and documenting clinical information in the EHR including the differential Dx, treatment, and any further evaluation and other management    Signed electronically by: Stefano Redgie Butts, MD  [facility]  [facility] [address] [place] [place], [place] [address] Phone: [phone] FAX: [phone]   CC: Vicci Barnie NOVAK, MD [address] [place] [address] Phone: [phone]  Fax: [phone]    Return to Endocrinology clinic as below: Future Appointments  Date Time Provider Department Center  10/22/2023  8:00 AM [facility] 1 [facility] [place]  11/09/2023 12:15 PM [facility] LAB [facility] None  11/09/2023 12:30 PM [facility] INJ NURSE [facility]  None  11/09/2023 12:45 PM Ennever, Maude SAUNDERS, MD [facility] None  11/09/2023  1:00 PM [facility] INFUSION [facility] None  12/07/2023 11:15 AM [facility] None  12/07/2023 11:30 AM [facility] INJ NURSE [facility] None  12/07/2023 11:45 AM Tonette Lauraine HERO, PA-C [facility] None  12/07/2023 12:00 PM [facility] INFUSION [facility] None  02/02/2024  3:30 PM Vicci Barnie NOVAK, MD [facility] [place]

## 2023-10-20 ENCOUNTER — Other Ambulatory Visit: Payer: Self-pay

## 2023-10-22 ENCOUNTER — Ambulatory Visit (HOSPITAL_COMMUNITY)
Admission: RE | Admit: 2023-10-22 | Discharge: 2023-10-22 | Disposition: A | Discharge status: Home / Self Care | Payer: Self-pay | Source: Ambulatory Visit | Attending: Hematology & Oncology | Admitting: Hematology & Oncology

## 2023-10-22 ENCOUNTER — Ambulatory Visit: Payer: Self-pay | Admitting: Hematology & Oncology

## 2023-10-22 DIAGNOSIS — C220 Liver cell carcinoma: Secondary | ICD-10-CM | POA: Insufficient documentation

## 2023-10-22 MED ORDER — GADOBUTROL 1 MMOL/ML IV SOLN
7.7000 mL | Freq: Once | INTRAVENOUS | Status: AC | PRN
  Administered 2023-10-22: 7.7 mL via INTRAVENOUS

## 2023-10-24 ENCOUNTER — Encounter: Payer: Self-pay | Admitting: Hematology & Oncology

## 2023-10-24 ENCOUNTER — Other Ambulatory Visit: Payer: Self-pay

## 2023-10-24 NOTE — Telephone Encounter (Signed)
 Advised via MyChart.

## 2023-10-26 ENCOUNTER — Other Ambulatory Visit: Payer: Self-pay

## 2023-10-31 ENCOUNTER — Other Ambulatory Visit: Payer: Self-pay

## 2023-10-31 ENCOUNTER — Encounter: Payer: Self-pay | Admitting: Hematology & Oncology

## 2023-11-03 ENCOUNTER — Other Ambulatory Visit: Payer: Self-pay

## 2023-11-09 ENCOUNTER — Inpatient Hospital Stay: Payer: Self-pay

## 2023-11-09 ENCOUNTER — Inpatient Hospital Stay (HOSPITAL_BASED_OUTPATIENT_CLINIC_OR_DEPARTMENT_OTHER): Payer: Self-pay | Admitting: Hematology & Oncology

## 2023-11-09 ENCOUNTER — Inpatient Hospital Stay: Payer: Self-pay | Attending: Hematology & Oncology

## 2023-11-09 ENCOUNTER — Encounter: Payer: Self-pay | Admitting: Hematology & Oncology

## 2023-11-09 VITALS — BP 126/57 | HR 108 | Temp 98.3°F | Resp 18 | Ht 64.0 in | Wt 169.0 lb

## 2023-11-09 VITALS — BP 130/59 | HR 94

## 2023-11-09 DIAGNOSIS — C220 Liver cell carcinoma: Secondary | ICD-10-CM | POA: Insufficient documentation

## 2023-11-09 DIAGNOSIS — Z5112 Encounter for antineoplastic immunotherapy: Secondary | ICD-10-CM | POA: Insufficient documentation

## 2023-11-09 LAB — CBC WITH DIFFERENTIAL (CANCER CENTER ONLY)
Abs Immature Granulocytes: 0.01 K/uL (ref 0.00–0.07)
Basophils Absolute: 0 K/uL (ref 0.0–0.1)
Basophils Relative: 0 %
Eosinophils Absolute: 0 K/uL (ref 0.0–0.5)
Eosinophils Relative: 0 %
HCT: 41.2 % (ref 36.0–46.0)
Hemoglobin: 13.6 g/dL (ref 12.0–15.0)
Immature Granulocytes: 0 %
Lymphocytes Relative: 4 %
Lymphs Abs: 0.2 K/uL — ABNORMAL LOW (ref 0.7–4.0)
MCH: 28.2 pg (ref 26.0–34.0)
MCHC: 33 g/dL (ref 30.0–36.0)
MCV: 85.5 fL (ref 80.0–100.0)
Monocytes Absolute: 0.4 K/uL (ref 0.1–1.0)
Monocytes Relative: 6 %
Neutro Abs: 6.1 K/uL (ref 1.7–7.7)
Neutrophils Relative %: 90 %
Platelet Count: 166 K/uL (ref 150–400)
RBC: 4.82 MIL/uL (ref 3.87–5.11)
RDW: 20 % — ABNORMAL HIGH (ref 11.5–15.5)
WBC Count: 6.8 K/uL (ref 4.0–10.5)
nRBC: 0 % (ref 0.0–0.2)

## 2023-11-09 LAB — CMP (CANCER CENTER ONLY)
ALT: 24 U/L (ref 0–44)
AST: 15 U/L (ref 15–41)
Albumin: 4.1 g/dL (ref 3.5–5.0)
Alkaline Phosphatase: 57 U/L (ref 38–126)
Anion gap: 15 (ref 5–15)
BUN: 20 mg/dL (ref 8–23)
CO2: 18 mmol/L — ABNORMAL LOW (ref 22–32)
Calcium: 9.7 mg/dL (ref 8.9–10.3)
Chloride: 97 mmol/L — ABNORMAL LOW (ref 98–111)
Creatinine: 0.97 mg/dL (ref 0.44–1.00)
GFR, Estimated: 60 mL/min — ABNORMAL LOW (ref >60)
Glucose, Bld: 306 mg/dL — ABNORMAL HIGH (ref 70–99)
Potassium: 3.8 mmol/L (ref 3.5–5.1)
Sodium: 129 mmol/L — ABNORMAL LOW (ref 135–145)
Total Bilirubin: 0.6 mg/dL (ref 0.0–1.2)
Total Protein: 6.8 g/dL (ref 6.5–8.1)

## 2023-11-09 MED ORDER — SODIUM CHLORIDE 0.9 % IV SOLN: Freq: Once | INTRAVENOUS | Status: AC

## 2023-11-09 MED ORDER — SODIUM CHLORIDE 0.9 % IV SOLN
480.0000 mg | Freq: Once | INTRAVENOUS | Status: AC
  Administered 2023-11-09: 480 mg via INTRAVENOUS
  Filled 2023-11-09: qty 48

## 2023-11-09 NOTE — Patient Instructions (Signed)
 [facility] - A DEPT OF [facility]  Discharge Instructions: Thank you for choosing [facility] to provide your oncology and hematology care.   If you have a lab appointment with the Cancer Center, please go directly to the Cancer Center and check in at the registration area.  Wear comfortable clothing and clothing appropriate for easy access to any Portacath or PICC line.   We strive to give you quality time with your provider. You may need to reschedule your appointment if you arrive late (15 or more minutes).  Arriving late affects you and other patients whose appointments are after yours.  Also, if you miss three or more appointments without notifying the office, you may be dismissed from the clinic at the provider's discretion.      For prescription refill requests, have your pharmacy contact our office and allow 72 hours for refills to be completed.    Today you received the following chemotherapy and/or immunotherapy agents Opdivo       To help prevent nausea and vomiting after your treatment, we encourage you to take your nausea medication as directed.  BELOW ARE SYMPTOMS THAT SHOULD BE REPORTED IMMEDIATELY: *FEVER GREATER THAN 100.4 F (38 C) OR HIGHER *CHILLS OR SWEATING *NAUSEA AND VOMITING THAT IS NOT CONTROLLED WITH YOUR NAUSEA MEDICATION *UNUSUAL SHORTNESS OF BREATH *UNUSUAL BRUISING OR BLEEDING *URINARY PROBLEMS (pain or burning when urinating, or frequent urination) *BOWEL PROBLEMS (unusual diarrhea, constipation, pain near the anus) TENDERNESS IN MOUTH AND THROAT WITH OR WITHOUT PRESENCE OF ULCERS (sore throat, sores in mouth, or a toothache) UNUSUAL RASH, SWELLING OR PAIN  UNUSUAL VAGINAL DISCHARGE OR ITCHING   Items with * indicate a potential emergency and should be followed up as soon as possible or go to the Emergency Department if any problems should occur.  Please show the CHEMOTHERAPY ALERT CARD or IMMUNOTHERAPY  ALERT CARD at check-in to the Emergency Department and triage nurse. Should you have questions after your visit or need to cancel or reschedule your appointment, please contact [facility] - A DEPT OF [facility]  [phone] and follow the prompts.  Office hours are 8:00 a.m. to 4:30 p.m. Monday - Friday. Please note that voicemails left after 4:00 p.m. may not be returned until the following business day.  We are closed weekends and major holidays. You have access to a nurse at all times for urgent questions. Please call the main number to the clinic [phone] and follow the prompts.  For any non-urgent questions, you may also contact your provider using MyChart. We now offer e-Visits for anyone 77 and older to request care online for non-urgent symptoms. For details visit [email or web address].   Also download the MyChart app! Go to the app store, search "MyChart", open the app, select [facility], and log in with your MyChart username and password.

## 2023-11-09 NOTE — Progress Notes (Signed)
 Hematology and Oncology Follow Up Visit  [name] [name] [name] [name] [id number] 10-Jul-1945 78 y.o. 11/09/2023   Principle Diagnosis:  Hepatocellular carcinoma-multifocal Hepatitis C   Current Therapy:        Status post cycle 1 of nivolumab /ipilimumab  -- d/c on 10/13/2020 due to hepatic toxicity Nivolumab  480 mg IV every 6 weeks - s/p cycle #39 -- started 11/21/2020 --changed to 480 mg on 08/2021 Nexavar  200 mg po BID -- to be taken while in [place] -- start on 02/26/2022 -- d/c on 06/16/2022   Interim History:  [name] is here today for follow-up.  She continues to do incredibly well.  She had a MRI of the liver recently.  This was done on 10/22/2023.  This showed continued improvement.  Her tumor continues to shrink.  Her last alpha-fetoprotein was normal at 5.8.  Her blood sugars have been on the high side.  She had little bit of diarrhea this morning..  She has had no problems with the right eye.  She has a right eye prosthesis.  She is managing pretty nicely with this.  Her skin lesions are also improving.  Every now that her dermatologist injects cortisone into them and they regressed nicely.  She has had no bleeding.  She has had no cough.  She has had no swollen lymph nodes.  Overall, I would say that her performance status is probably ECOG 1.      Medications:  Allergies as of 11/09/2023       Reactions   Voriconazole  Shortness Of Breath, Swelling   Patient reported that tablets sent her to the ER   Bactrim [sulfamethoxazole-trimethoprim] Swelling, Other (See Comments)   Site of swelling not recalled   Clarithromycin  Swelling, Rash, Other (See Comments)   Daughter is unsure if Clarithromycin  or Amoxicillin  caused rash and swelling. Patient states no allergy to amoxicillan - has taken this recently with no issues. (12/02/21)        Medication List        Accurate as of November 09, 2023  2:04 PM. If you have any questions, ask your nurse or  doctor.          Basaglar  KwikPen 100 UNIT/ML Inject 12 Units into the skin daily.   brimonidine  0.15 % ophthalmic solution Commonly known as: ALPHAGAN  Place 1 drop into the left eye 2 (two) times daily.   clobetasol  ointment 0.05 % Commonly known as: TEMOVATE  Apply topically.   dapagliflozin  propanediol 10 MG Tabs tablet Commonly known as: Farxiga  Take 1 tablet (10 mg total) by mouth daily before breakfast.   diphenhydramine -acetaminophen  25-500 MG Tabs tablet Commonly known as: TYLENOL  PM Take by mouth.   gabapentin  300 MG capsule Commonly known as: NEURONTIN  Take 1 capsule (300 mg total) by mouth 3 (three) times daily.   HumaLOG  KwikPen 200 UNIT/ML KwikPen Generic drug: insulin  lispro Max daily 45 units What changed: additional instructions   hydrALAZINE  50 MG tablet Commonly known as: APRESOLINE  Take 1 tablet (50 mg total) by mouth in the morning and at bedtime.   Insupen Pen Needles 32G X 4 MM Misc Generic drug: Insulin  Pen Needle Use in the morning, at noon, in the evening, and at bedtime.   levothyroxine  75 MCG tablet Commonly known as: Synthroid  Take 1 tablet (75 mcg total) by mouth daily before breakfast.   lidocaine -prilocaine  cream Commonly known as: EMLA  Apply 1 Application topically as needed (for port access).   losartan  100 MG tablet Commonly known as: COZAAR  Take 1  tablet (100 mg total) by mouth at bedtime.   NIFEdipine  30 MG 24 hr tablet Commonly known as: PROCARDIA -XL/NIFEDICAL-XL Take 1 tablet (30 mg total) by mouth in the morning and at bedtime.   Opdivo  100 MG/10ML Soln chemo injection Generic drug: nivolumab  Inject into the vein.   pantoprazole  40 MG tablet Commonly known as: Protonix  Take 1 tablet (40 mg total) by mouth 2 (two) times daily.   polyethylene glycol powder 17 GM/SCOOP powder Commonly known as: GLYCOLAX /MIRALAX  Take 17 g by mouth daily as needed for mild constipation.   predniSONE  10 MG tablet Commonly known as:  DELTASONE  Take 1 tablet (10 mg total) by mouth in the morning.   triamcinolone  0.025 % ointment Commonly known as: KENALOG  Apply 1 Application topically 2 (two) times daily. May be used for up to 2 weeks at a time. Restart as needed after 2 week break from this medication   True Metrix Blood Glucose Test test strip Generic drug: glucose blood Use as instructed        Allergies:  Allergies  Allergen Reactions   Voriconazole  Shortness Of Breath and Swelling    Patient reported that tablets sent her to the ER   Bactrim [Sulfamethoxazole-Trimethoprim] Swelling and Other (See Comments)    Site of swelling not recalled   Clarithromycin  Swelling, Rash and Other (See Comments)    Daughter is unsure if Clarithromycin  or Amoxicillin  caused rash and swelling.  Patient states no allergy to amoxicillan - has taken this recently with no issues. (12/02/21)    Past Medical History, Surgical history, Social history, and Family History were reviewed and updated.  Review of Systems: Review of Systems  Constitutional: Negative.   HENT: Negative.    Eyes: Negative.   Respiratory: Negative.    Cardiovascular: Negative.   Gastrointestinal: Negative.   Genitourinary: Negative.   Musculoskeletal: Negative.   Skin: Negative.   Neurological:  Positive for tingling.  Endo/Heme/Allergies: Negative.   Psychiatric/Behavioral: Negative.       Physical Exam: Vital signs with temperature of 140 screen 98.3.  Pulse 108.  Blood pressure 126/57.  Weight is at 169 pounds.   Wt Readings from Last 3 Encounters:  11/09/23 169 lb (76.7 kg)  10/19/23 172 lb 12.8 oz (78.4 kg)  10/12/23 172 lb 1.3 oz (78.1 kg)    Physical Exam Vitals reviewed.  HENT:     Head: Normocephalic and atraumatic.  Eyes:     Pupils: Pupils are equal, round, and reactive to light.     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     Cardiovascular:     Rate and Rhythm: Normal  rate and regular rhythm.     Heart sounds: Normal heart sounds.  Pulmonary:     Effort: Pulmonary effort is normal.     Breath sounds: Normal breath sounds.  Abdominal:     General: Bowel sounds are normal.     Palpations: Abdomen is soft.     Comments: Abdominal exam shows a well-healed laparotomy scar.  She has no fluid wave.  There is no guarding or rebound tenderness to palpation.  There is no palpable liver or spleen tip.  Musculoskeletal:        General: No tenderness or deformity. Normal range of motion.     Cervical back: Normal range of motion.  Lymphadenopathy:     Cervical: No cervical adenopathy.  Skin:    General: Skin is warm and dry.  Findings: No erythema or rash.     Comments: Skin exam does show a  papular rash on her lower legs.  This is above and below the knees.  These lesions are somewhat firm.  They are raised.  They are none tender.    Neurological:     Mental Status: She is alert and oriented to person, place, and time.  Psychiatric:        Behavior: Behavior normal.        Thought Content: Thought content normal.        Judgment: Judgment normal.       Lab Results  Component Value Date   WBC 6.8 11/09/2023   HGB 13.6 11/09/2023   HCT 41.2 11/09/2023   MCV 85.5 11/09/2023   PLT 166 11/09/2023   Lab Results  Component Value Date   FERRITIN 230 08/17/2023   IRON  120 08/17/2023   TIBC 328 08/17/2023   UIBC 208 08/17/2023   IRONPCTSAT 37 (H) 08/17/2023   Lab Results  Component Value Date   RETICCTPCT 2.4 08/17/2023   RBC 4.82 11/09/2023   No results found for: KPAFRELGTCHN, LAMBDASER, KAPLAMBRATIO No results found for: IGGSERUM, IGA, IGMSERUM No results found for: STEPHANY CARLOTA BENSON MARKEL EARLA JOANNIE DOC, MSPIKE, SPEI   Chemistry      Component Value Date/Time   NA 129 (L) 11/09/2023 1240   NA 140 01/22/2022 0849   K 3.8 11/09/2023 1240   CL 97 (L) 11/09/2023 1240   CO2 18 (L) 11/09/2023  1240   BUN 20 11/09/2023 1240   BUN 11 01/22/2022 0849   CREATININE 0.97 11/09/2023 1240   CREATININE 0.58 (L) 08/20/2021 1541      Component Value Date/Time   CALCIUM 9.7 11/09/2023 1240   ALKPHOS 57 11/09/2023 1240   AST 15 11/09/2023 1240   ALT 24 11/09/2023 1240   BILITOT 0.6 11/09/2023 1240       Impression and Plan: [name] is a very pleasant 78 yo female from [place] with hepatocellular carcinoma and Hepatitis C.  She actually has had treatment for the Hepatitis C.  I am sure that this is part of the reason why she has done so well with response.  We will now move her treatments out to every 6 weeks.  I think this will certainly be reasonable and safe.  I had to believe that the immunotherapy really has helped her malignancy.  Her quality of life is doing quite well right now.  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  She is not sure if she will go back to [place] in December.  We will plan to get her back in 6 weeks.  Will try to arrange her treatment so that we can get her around the Holiday season.    Maude JONELLE Crease, MD 9/24/20252:04 PM

## 2023-11-10 LAB — AFP TUMOR MARKER: AFP, Serum, Tumor Marker: 4.9 ng/mL (ref 0.0–9.2)

## 2023-11-11 ENCOUNTER — Encounter: Payer: Self-pay | Admitting: Hematology & Oncology

## 2023-11-14 ENCOUNTER — Encounter: Payer: Self-pay | Admitting: Hematology & Oncology

## 2023-11-14 ENCOUNTER — Other Ambulatory Visit: Payer: Self-pay

## 2023-11-25 ENCOUNTER — Encounter: Payer: Self-pay | Admitting: Hematology & Oncology

## 2023-11-28 ENCOUNTER — Other Ambulatory Visit: Payer: Self-pay | Admitting: Hematology & Oncology

## 2023-11-28 ENCOUNTER — Encounter: Payer: Self-pay | Admitting: Hematology & Oncology

## 2023-11-28 ENCOUNTER — Other Ambulatory Visit: Payer: Self-pay

## 2023-11-28 DIAGNOSIS — C220 Liver cell carcinoma: Secondary | ICD-10-CM

## 2023-11-28 MED ORDER — LEVOTHYROXINE SODIUM 75 MCG PO TABS
75.0000 ug | ORAL_TABLET | Freq: Every day | ORAL | 3 refills | Status: DC
  Filled 2023-11-28: qty 30, 30d supply, fill #0
  Filled 2024-01-02: qty 30, 30d supply, fill #1
  Filled 2024-01-31: qty 60, 60d supply, fill #2

## 2023-12-01 ENCOUNTER — Other Ambulatory Visit: Payer: Self-pay

## 2023-12-06 ENCOUNTER — Other Ambulatory Visit: Payer: Self-pay

## 2023-12-07 ENCOUNTER — Other Ambulatory Visit: Payer: Self-pay

## 2023-12-07 ENCOUNTER — Ambulatory Visit: Payer: Self-pay

## 2023-12-07 ENCOUNTER — Ambulatory Visit: Payer: Self-pay | Admitting: Medical Oncology

## 2023-12-15 ENCOUNTER — Other Ambulatory Visit: Payer: Self-pay

## 2023-12-19 ENCOUNTER — Encounter: Payer: Self-pay | Admitting: Hematology & Oncology

## 2023-12-19 ENCOUNTER — Other Ambulatory Visit: Payer: Self-pay

## 2023-12-19 ENCOUNTER — Other Ambulatory Visit: Payer: Self-pay | Admitting: Internal Medicine

## 2023-12-19 MED ORDER — PANTOPRAZOLE SODIUM 40 MG PO TBEC
40.0000 mg | DELAYED_RELEASE_TABLET | Freq: Two times a day (BID) | ORAL | 0 refills | Status: DC
  Filled 2023-12-19: qty 180, 90d supply, fill #0

## 2023-12-20 ENCOUNTER — Encounter: Payer: Self-pay | Admitting: Family

## 2023-12-20 ENCOUNTER — Inpatient Hospital Stay: Payer: Self-pay | Attending: Hematology & Oncology

## 2023-12-20 ENCOUNTER — Inpatient Hospital Stay (HOSPITAL_BASED_OUTPATIENT_CLINIC_OR_DEPARTMENT_OTHER): Payer: Self-pay | Admitting: Family

## 2023-12-20 ENCOUNTER — Inpatient Hospital Stay: Payer: Self-pay

## 2023-12-20 VITALS — BP 163/71 | HR 98 | Temp 98.2°F | Resp 20 | Ht 64.0 in | Wt 170.5 lb

## 2023-12-20 DIAGNOSIS — E032 Hypothyroidism due to medicaments and other exogenous substances: Secondary | ICD-10-CM

## 2023-12-20 DIAGNOSIS — C220 Liver cell carcinoma: Secondary | ICD-10-CM

## 2023-12-20 DIAGNOSIS — B1920 Unspecified viral hepatitis C without hepatic coma: Secondary | ICD-10-CM | POA: Insufficient documentation

## 2023-12-20 DIAGNOSIS — Z79620 Long term (current) use of immunosuppressive biologic: Secondary | ICD-10-CM | POA: Insufficient documentation

## 2023-12-20 DIAGNOSIS — Z5112 Encounter for antineoplastic immunotherapy: Secondary | ICD-10-CM | POA: Insufficient documentation

## 2023-12-20 DIAGNOSIS — Z9221 Personal history of antineoplastic chemotherapy: Secondary | ICD-10-CM | POA: Insufficient documentation

## 2023-12-20 LAB — CBC WITH DIFFERENTIAL (CANCER CENTER ONLY)
Abs Immature Granulocytes: 0.02 K/uL (ref 0.00–0.07)
Basophils Absolute: 0 K/uL (ref 0.0–0.1)
Basophils Relative: 0 %
Eosinophils Absolute: 0 K/uL (ref 0.0–0.5)
Eosinophils Relative: 0 %
HCT: 45.2 % (ref 36.0–46.0)
Hemoglobin: 14.7 g/dL (ref 12.0–15.0)
Immature Granulocytes: 0 %
Lymphocytes Relative: 12 %
Lymphs Abs: 0.7 K/uL (ref 0.7–4.0)
MCH: 28.6 pg (ref 26.0–34.0)
MCHC: 32.5 g/dL (ref 30.0–36.0)
MCV: 87.9 fL (ref 80.0–100.0)
Monocytes Absolute: 0.2 K/uL (ref 0.1–1.0)
Monocytes Relative: 3 %
Neutro Abs: 5 K/uL (ref 1.7–7.7)
Neutrophils Relative %: 85 %
Platelet Count: 200 K/uL (ref 150–400)
RBC: 5.14 MIL/uL — ABNORMAL HIGH (ref 3.87–5.11)
RDW: 16.5 % — ABNORMAL HIGH (ref 11.5–15.5)
WBC Count: 5.9 K/uL (ref 4.0–10.5)
nRBC: 0 % (ref 0.0–0.2)

## 2023-12-20 LAB — CMP (CANCER CENTER ONLY)
ALT: 22 U/L (ref 0–44)
AST: 21 U/L (ref 15–41)
Albumin: 4.4 g/dL (ref 3.5–5.0)
Alkaline Phosphatase: 56 U/L (ref 38–126)
Anion gap: 12 (ref 5–15)
BUN: 15 mg/dL (ref 8–23)
CO2: 20 mmol/L — ABNORMAL LOW (ref 22–32)
Calcium: 9.9 mg/dL (ref 8.9–10.3)
Chloride: 105 mmol/L (ref 98–111)
Creatinine: 0.76 mg/dL (ref 0.44–1.00)
GFR, Estimated: >60 mL/min (ref >60)
Glucose, Bld: 229 mg/dL — ABNORMAL HIGH (ref 70–99)
Potassium: 4.1 mmol/L (ref 3.5–5.1)
Sodium: 137 mmol/L (ref 135–145)
Total Bilirubin: 0.5 mg/dL (ref 0.0–1.2)
Total Protein: 7 g/dL (ref 6.5–8.1)

## 2023-12-20 LAB — LACTATE DEHYDROGENASE: LDH: 240 U/L — ABNORMAL HIGH (ref 98–192)

## 2023-12-20 MED ORDER — SODIUM CHLORIDE 0.9 % IV SOLN
480.0000 mg | Freq: Once | INTRAVENOUS | Status: AC
  Administered 2023-12-20: 480 mg via INTRAVENOUS
  Filled 2023-12-20: qty 48

## 2023-12-20 MED ORDER — SODIUM CHLORIDE 0.9 % IV SOLN: Freq: Once | INTRAVENOUS | Status: AC

## 2023-12-20 NOTE — Patient Instructions (Signed)

## 2023-12-20 NOTE — Progress Notes (Signed)
 Hematology and Oncology Follow Up Visit  [name] [name] [id number] [birth year] 78 y.o. 12/20/2023   Principle Diagnosis:  Hepatocellular carcinoma-multifocal Hepatitis C   Current Therapy:        Status post cycle 1 of nivolumab /ipilimumab  -- d/c on 10/13/2020 due to hepatic toxicity Nivolumab  480 mg IV every 6 weeks - s/p cycle #39 -- started 11/21/2020 --changed to 480 mg on 08/2021 Nexavar  200 mg po BID -- to be taken while in [place] -- start on 02/26/2022 -- d/c on 06/16/2022   Interim History:  [name] [name] is here today for follow-up and treatment. She is doing well and has no complaints at this time.  LFT's are within normal limits. Platelets are 200, Hgb 14.7, MCV 87 and WBC count is 5.9.  AFP at last visit was 4.9.  She has occasional mild SOB and palpitations with exertion (stairs). No fever, chills, n/v, cough, rash, dizziness, SOB, chest pain, palpitations, abdominal pain or changes in bowel or bladder habits at this time.   No swelling, tenderness, numbness or tingling in her extremities.  No falls or syncope.  Appetite and hydration are good. Weight 170 lbs.   ECOG Performance Status: 1 - Symptomatic but completely ambulatory  Medications:  Allergies as of 12/20/2023       Reactions   Voriconazole  Shortness Of Breath, Swelling   Patient reported that tablets sent her to the ER   Bactrim [sulfamethoxazole-trimethoprim] Swelling, Other (See Comments)   Site of swelling not recalled   Clarithromycin  Swelling, Rash, Other (See Comments)   Daughter is unsure if Clarithromycin  or Amoxicillin  caused rash and swelling. Patient states no allergy to amoxicillan - has taken this recently with no issues. (12/02/21)        Medication List        Accurate as of December 20, 2023 12:33 PM. If you have any questions, ask your nurse or doctor.          Basaglar  KwikPen 100 UNIT/ML Inject 12 Units into the skin daily.   brimonidine  0.15  % ophthalmic solution Commonly known as: ALPHAGAN  Place 1 drop into the left eye 2 (two) times daily.   clobetasol  ointment 0.05 % Commonly known as: TEMOVATE  Apply topically.   dapagliflozin  propanediol 10 MG Tabs tablet Commonly known as: Farxiga  Take 1 tablet (10 mg total) by mouth daily before breakfast.   diphenhydramine -acetaminophen  25-500 MG Tabs tablet Commonly known as: TYLENOL  PM Take by mouth.   gabapentin  300 MG capsule Commonly known as: NEURONTIN  Take 1 capsule (300 mg total) by mouth 3 (three) times daily.   HumaLOG  KwikPen 200 UNIT/ML KwikPen Generic drug: insulin  lispro Max daily 45 units What changed: additional instructions   hydrALAZINE  50 MG tablet Commonly known as: APRESOLINE  Take 1 tablet (50 mg total) by mouth in the morning and at bedtime.   Insupen Pen Needles 32G X 4 MM Misc Generic drug: Insulin  Pen Needle Use in the morning, at noon, in the evening, and at bedtime.   levothyroxine  75 MCG tablet Commonly known as: Synthroid  Take 1 tablet (75 mcg total) by mouth daily before breakfast.   lidocaine -prilocaine  cream Commonly known as: EMLA  Apply 1 Application topically as needed (for port access).   losartan  100 MG tablet Commonly known as: COZAAR  Take 1 tablet (100 mg total) by mouth at bedtime.   NIFEdipine  30 MG 24 hr tablet Commonly known as: PROCARDIA -XL/NIFEDICAL-XL Take 1 tablet (30 mg total) by mouth in the morning and at bedtime.  Opdivo  100 MG/10ML Soln chemo injection Generic drug: nivolumab  Inject into the vein.   pantoprazole  40 MG tablet Commonly known as: Protonix  Take 1 tablet (40 mg total) by mouth 2 (two) times daily.   polyethylene glycol powder 17 GM/SCOOP powder Commonly known as: GLYCOLAX /MIRALAX  Take 17 g by mouth daily as needed for mild constipation.   predniSONE  10 MG tablet Commonly known as: DELTASONE  Take 1 tablet (10 mg total) by mouth in the morning.   triamcinolone  0.025 % ointment Commonly  known as: KENALOG  Apply 1 Application topically 2 (two) times daily. May be used for up to 2 weeks at a time. Restart as needed after 2 week break from this medication   True Metrix Blood Glucose Test test strip Generic drug: glucose blood Use as instructed        Allergies:  Allergies  Allergen Reactions   Voriconazole  Shortness Of Breath and Swelling    Patient reported that tablets sent her to the ER   Bactrim [Sulfamethoxazole-Trimethoprim] Swelling and Other (See Comments)    Site of swelling not recalled   Clarithromycin  Swelling, Rash and Other (See Comments)    Daughter is unsure if Clarithromycin  or Amoxicillin  caused rash and swelling.  Patient states no allergy to amoxicillan - has taken this recently with no issues. (12/02/21)    Past Medical History, Surgical history, Social history, and Family History were reviewed and updated.  Review of Systems: All other 10 point review of systems is negative.   Physical Exam:  height is 5' 4 (1.626 m) and weight is 170 lb 8 oz (77.3 kg). Her oral temperature is 98.2 F (36.8 C). Her blood pressure is 163/71 (abnormal) and her pulse is 98. Her respiration is 20 and oxygen saturation is 97%.   Wt Readings from Last 3 Encounters:  12/20/23 170 lb 8 oz (77.3 kg)  12/20/23 169 lb (76.7 kg)  11/09/23 169 lb (76.7 kg)    Ocular: Sclerae unicteric, pupils equal, round and reactive to light Ear-nose-throat: Oropharynx clear, dentition fair Lymphatic: No cervical or supraclavicular adenopathy Lungs no rales or rhonchi, good excursion bilaterally Heart regular rate and rhythm, no murmur appreciated Abd soft, nontender, positive bowel sounds MSK no focal spinal tenderness, no joint edema Neuro: non-focal, well-oriented, appropriate affect Breasts: Deferred   Lab Results  Component Value Date   WBC 6.8 11/09/2023   HGB 13.6 11/09/2023   HCT 41.2 11/09/2023   MCV 85.5 11/09/2023   PLT 166 11/09/2023   Lab Results   Component Value Date   FERRITIN 230 08/17/2023   IRON  120 08/17/2023   TIBC 328 08/17/2023   UIBC 208 08/17/2023   IRONPCTSAT 37 (H) 08/17/2023   Lab Results  Component Value Date   RETICCTPCT 2.4 08/17/2023   RBC 4.82 11/09/2023   No results found for: KPAFRELGTCHN, LAMBDASER, KAPLAMBRATIO No results found for: IGGSERUM, IGA, IGMSERUM No results found for: STEPHANY CARLOTA BENSON MARKEL EARLA JOANNIE, GAMS, MSPIKE, SPEI   Chemistry      Component Value Date/Time   NA 129 (L) 11/09/2023 1240   NA 140 01/22/2022 0849   K 3.8 11/09/2023 1240   CL 97 (L) 11/09/2023 1240   CO2 18 (L) 11/09/2023 1240   BUN 20 11/09/2023 1240   BUN 11 01/22/2022 0849   CREATININE 0.97 11/09/2023 1240   CREATININE 0.58 (L) 08/20/2021 1541      Component Value Date/Time   CALCIUM 9.7 11/09/2023 1240   ALKPHOS 57 11/09/2023 1240   AST 15 11/09/2023  1240   ALT 24 11/09/2023 1240   BILITOT 0.6 11/09/2023 1240       Impression and Plan: [name] [name] is a very pleasant 78 yo female from [place] with hepatocellular carcinoma and Hepatitis C.  She actually has had treatment for the Hepatitis C.   She is now on treatment every 6 weeks and is doing well.  AFP pending. Has remained stable in the past.  TSH at last visit was stable at 1.130. Will recheck at follow-up.  We will proceed with Nivolumab  today as planned.  Follow-up in 6 weeks.   Lauraine Pepper, NP 11/4/202512:33 PM

## 2023-12-21 ENCOUNTER — Ambulatory Visit: Payer: Self-pay | Admitting: Hematology & Oncology

## 2023-12-21 LAB — AFP TUMOR MARKER: AFP, Serum, Tumor Marker: 5.2 ng/mL (ref 0.0–9.2)

## 2023-12-21 NOTE — Telephone Encounter (Signed)
-----   Message from Maude JONELLE Crease sent at 12/21/2023 10:13 AM EST ----- Please call and let her know that the tumor marker is nice and stable at 5.2.  This is excellent.  Jeralyn ----- Message ----- From: Rebecka, [facility] [place] Sent: 12/20/2023  12:33 PM EST To: Maude JONELLE Crease, MD

## 2023-12-21 NOTE — Telephone Encounter (Signed)
 Advised via MyChart.

## 2023-12-22 ENCOUNTER — Other Ambulatory Visit: Payer: Self-pay

## 2023-12-22 ENCOUNTER — Encounter: Payer: Self-pay | Admitting: Hematology & Oncology

## 2023-12-26 ENCOUNTER — Encounter: Payer: Self-pay | Admitting: Internal Medicine

## 2024-01-02 ENCOUNTER — Other Ambulatory Visit: Payer: Self-pay

## 2024-01-02 ENCOUNTER — Encounter: Payer: Self-pay | Admitting: Hematology & Oncology

## 2024-01-02 MED ORDER — PREDNISONE 5 MG PO TABS
ORAL_TABLET | ORAL | 0 refills | Status: AC
  Filled 2024-01-02: qty 45, 60d supply, fill #0

## 2024-01-03 ENCOUNTER — Other Ambulatory Visit: Payer: Self-pay

## 2024-01-11 ENCOUNTER — Other Ambulatory Visit: Payer: Self-pay | Admitting: Internal Medicine

## 2024-01-11 ENCOUNTER — Other Ambulatory Visit: Payer: Self-pay

## 2024-01-11 ENCOUNTER — Encounter: Payer: Self-pay | Admitting: Hematology & Oncology

## 2024-01-11 MED ORDER — HUMALOG KWIKPEN 200 UNIT/ML ~~LOC~~ SOPN
14.0000 [IU] | PEN_INJECTOR | Freq: Three times a day (TID) | SUBCUTANEOUS | 3 refills | Status: AC
  Filled 2024-01-11: qty 75, fill #0
  Filled 2024-01-11: qty 6, 14d supply, fill #0

## 2024-01-11 MED ORDER — DAPAGLIFLOZIN PROPANEDIOL 10 MG PO TABS: 10.0000 mg | ORAL_TABLET | Freq: Every day | ORAL | 3 refills | Status: AC

## 2024-01-11 NOTE — Telephone Encounter (Signed)
Farxiga refill sent

## 2024-01-11 NOTE — Addendum Note (Signed)
 Addended by: Charise Leinbach M on: 01/11/2024 09:20 AM   Modules accepted: Orders

## 2024-01-16 ENCOUNTER — Other Ambulatory Visit: Payer: Self-pay

## 2024-01-31 ENCOUNTER — Other Ambulatory Visit: Payer: Self-pay

## 2024-01-31 ENCOUNTER — Encounter: Payer: Self-pay | Admitting: Hematology & Oncology

## 2024-01-31 ENCOUNTER — Other Ambulatory Visit: Payer: Self-pay | Admitting: Internal Medicine

## 2024-01-31 DIAGNOSIS — I152 Hypertension secondary to endocrine disorders: Secondary | ICD-10-CM

## 2024-01-31 MED ORDER — LOSARTAN POTASSIUM 100 MG PO TABS
100.0000 mg | ORAL_TABLET | Freq: Every day | ORAL | 0 refills | Status: DC
  Filled 2024-01-31: qty 90, 90d supply, fill #0

## 2024-01-31 MED ORDER — PANTOPRAZOLE SODIUM 40 MG PO TBEC
40.0000 mg | DELAYED_RELEASE_TABLET | Freq: Two times a day (BID) | ORAL | 0 refills | Status: AC
  Filled 2024-01-31: qty 180, 90d supply, fill #0

## 2024-02-01 ENCOUNTER — Ambulatory Visit: Payer: Self-pay | Admitting: Hematology & Oncology

## 2024-02-01 ENCOUNTER — Encounter: Payer: Self-pay | Admitting: Hematology & Oncology

## 2024-02-01 ENCOUNTER — Ambulatory Visit: Payer: Self-pay

## 2024-02-01 ENCOUNTER — Inpatient Hospital Stay: Payer: Self-pay | Attending: Hematology & Oncology

## 2024-02-01 VITALS — BP 135/64 | HR 92 | Resp 18

## 2024-02-01 VITALS — BP 164/78 | HR 106 | Temp 98.5°F | Resp 18 | Ht 64.0 in | Wt 171.0 lb

## 2024-02-01 DIAGNOSIS — E032 Hypothyroidism due to medicaments and other exogenous substances: Secondary | ICD-10-CM

## 2024-02-01 DIAGNOSIS — C220 Liver cell carcinoma: Secondary | ICD-10-CM | POA: Insufficient documentation

## 2024-02-01 DIAGNOSIS — Z5112 Encounter for antineoplastic immunotherapy: Secondary | ICD-10-CM | POA: Insufficient documentation

## 2024-02-01 LAB — CMP (CANCER CENTER ONLY)
ALT: 25 U/L (ref 0–44)
AST: 23 U/L (ref 15–41)
Albumin: 4.5 g/dL (ref 3.5–5.0)
Alkaline Phosphatase: 62 U/L (ref 38–126)
Anion gap: 12 (ref 5–15)
BUN: 16 mg/dL (ref 8–23)
CO2: 22 mmol/L (ref 22–32)
Calcium: 10.3 mg/dL (ref 8.9–10.3)
Chloride: 106 mmol/L (ref 98–111)
Creatinine: 0.75 mg/dL (ref 0.44–1.00)
GFR, Estimated: >60 mL/min (ref >60)
Glucose, Bld: 102 mg/dL — ABNORMAL HIGH (ref 70–99)
Potassium: 4 mmol/L (ref 3.5–5.1)
Sodium: 140 mmol/L (ref 135–145)
Total Bilirubin: 0.4 mg/dL (ref 0.0–1.2)
Total Protein: 7.3 g/dL (ref 6.5–8.1)

## 2024-02-01 LAB — CBC WITH DIFFERENTIAL (CANCER CENTER ONLY)
Abs Immature Granulocytes: 0.01 K/uL (ref 0.00–0.07)
Basophils Absolute: 0 K/uL (ref 0.0–0.1)
Basophils Relative: 0 %
Eosinophils Absolute: 0 K/uL (ref 0.0–0.5)
Eosinophils Relative: 0 %
HCT: 43.6 % (ref 36.0–46.0)
Hemoglobin: 14.2 g/dL (ref 12.0–15.0)
Immature Granulocytes: 0 %
Lymphocytes Relative: 12 %
Lymphs Abs: 0.7 K/uL (ref 0.7–4.0)
MCH: 29 pg (ref 26.0–34.0)
MCHC: 32.6 g/dL (ref 30.0–36.0)
MCV: 89.2 fL (ref 80.0–100.0)
Monocytes Absolute: 0.3 K/uL (ref 0.1–1.0)
Monocytes Relative: 6 %
Neutro Abs: 4.9 K/uL (ref 1.7–7.7)
Neutrophils Relative %: 82 %
Platelet Count: 187 K/uL (ref 150–400)
RBC: 4.89 MIL/uL (ref 3.87–5.11)
RDW: 16.8 % — ABNORMAL HIGH (ref 11.5–15.5)
WBC Count: 6 K/uL (ref 4.0–10.5)
nRBC: 0 % (ref 0.0–0.2)

## 2024-02-01 LAB — TSH: TSH: 0.902 u[IU]/mL (ref 0.350–4.500)

## 2024-02-01 LAB — LACTATE DEHYDROGENASE: LDH: 189 U/L (ref 105–235)

## 2024-02-01 MED ORDER — SODIUM CHLORIDE 0.9 % IV SOLN
480.0000 mg | Freq: Once | INTRAVENOUS | Status: AC
  Administered 2024-02-01: 14:00:00 480 mg via INTRAVENOUS
  Filled 2024-02-01: qty 48

## 2024-02-01 MED ORDER — SODIUM CHLORIDE 0.9 % IV SOLN: Freq: Once | INTRAVENOUS | Status: AC

## 2024-02-01 NOTE — Patient Instructions (Signed)
 [facility] - A DEPT OF [facility]  Discharge Instructions: Thank you for choosing [facility] to provide your oncology and hematology care.   If you have a lab appointment with the Cancer Center, please go directly to the Cancer Center and check in at the registration area.  Wear comfortable clothing and clothing appropriate for easy access to any Portacath or PICC line.   We strive to give you quality time with your provider. You may need to reschedule your appointment if you arrive late (15 or more minutes).  Arriving late affects you and other patients whose appointments are after yours.  Also, if you miss three or more appointments without notifying the office, you may be dismissed from the clinic at the provider's discretion.      For prescription refill requests, have your pharmacy contact our office and allow 72 hours for refills to be completed.    Today you received the following chemotherapy and/or immunotherapy agents opdivo      To help prevent nausea and vomiting after your treatment, we encourage you to take your nausea medication as directed.  BELOW ARE SYMPTOMS THAT SHOULD BE REPORTED IMMEDIATELY: *FEVER GREATER THAN 100.4 F (38 C) OR HIGHER *CHILLS OR SWEATING *NAUSEA AND VOMITING THAT IS NOT CONTROLLED WITH YOUR NAUSEA MEDICATION *UNUSUAL SHORTNESS OF BREATH *UNUSUAL BRUISING OR BLEEDING *URINARY PROBLEMS (pain or burning when urinating, or frequent urination) *BOWEL PROBLEMS (unusual diarrhea, constipation, pain near the anus) TENDERNESS IN MOUTH AND THROAT WITH OR WITHOUT PRESENCE OF ULCERS (sore throat, sores in mouth, or a toothache) UNUSUAL RASH, SWELLING OR PAIN  UNUSUAL VAGINAL DISCHARGE OR ITCHING   Items with * indicate a potential emergency and should be followed up as soon as possible or go to the Emergency Department if any problems should occur.  Please show the CHEMOTHERAPY ALERT CARD or IMMUNOTHERAPY ALERT  CARD at check-in to the Emergency Department and triage nurse. Should you have questions after your visit or need to cancel or reschedule your appointment, please contact [facility] - A DEPT OF [facility]  [phone] and follow the prompts.  Office hours are 8:00 a.m. to 4:30 p.m. Monday - Friday. Please note that voicemails left after 4:00 p.m. may not be returned until the following business day.  We are closed weekends and major holidays. You have access to a nurse at all times for urgent questions. Please call the main number to the clinic [phone] and follow the prompts.  For any non-urgent questions, you may also contact your provider using MyChart. We now offer e-Visits for anyone 24 and older to request care online for non-urgent symptoms. For details visit [email or web address].   Also download the MyChart app! Go to the app store, search "MyChart", open the app, select [place], and log in with your MyChart username and password.

## 2024-02-01 NOTE — Progress Notes (Signed)
 Hematology and Oncology Follow Up Visit  [name] [name] [id number] [birth year] 78 y.o. 02/01/2024   Principle Diagnosis:  Hepatocellular carcinoma-multifocal Hepatitis C   Current Therapy:        Status post cycle 1 of nivolumab /ipilimumab  -- d/c on 10/13/2020 due to hepatic toxicity Nivolumab  480 mg IV every 6 weeks - s/p cycle #39 -- started 11/21/2020 --changed to 480 mg on 08/2021 Nexavar  200 mg po BID -- to be taken while in [place] -- start on 02/26/2022 -- d/c on 06/16/2022   Interim History:  [name] [name] is here today for follow-up.  She had limited bad news.  A very good friend had a son who died.  She is going go out to [place]  to be with her for little bit.  I know that she is a good friend to go all the way out to [place]  over the holiday season to be able to help her.  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  I am not sure often she goes back to the oculist to have that evaluated.  Her last alpha-fetoprotein was 5.2.  She has had no pruritus.  There is been no obvious change in bowel or bladder habits.  She has had limited swelling about the right ankle.  She has had no mouth sores.  She has had no headache.  Overall, I would have to say that her performance status is probably ECOG 1.     Medications:  Allergies as of 02/01/2024       Reactions   Voriconazole  Shortness Of Breath, Swelling   Patient reported that tablets sent her to the ER   Bactrim [sulfamethoxazole-trimethoprim] Swelling, Other (See Comments)   Site of swelling not recalled   Clarithromycin  Swelling, Rash, Other (See Comments)   Daughter is unsure if Clarithromycin  or Amoxicillin  caused rash and swelling. Patient states no allergy to amoxicillan - has taken this recently with no issues. (12/02/21)        Medication List        Accurate as of February 01, 2024 12:59 PM. If you have any questions, ask your nurse or doctor.           STOP taking these medications    clobetasol  ointment 0.05 % Commonly known as: TEMOVATE  Stopped by: Maude Crease, MD       TAKE these medications    Basaglar  KwikPen 100 UNIT/ML Inject 12 Units into the skin daily. What changed: how much to take   brimonidine  0.15 % ophthalmic solution Commonly known as: ALPHAGAN  Place 1 drop into the left eye 2 (two) times daily.   dapagliflozin  propanediol 10 MG Tabs tablet Commonly known as: Farxiga  Take 1 tablet (10 mg total) by mouth daily before breakfast.   diphenhydramine -acetaminophen  25-500 MG Tabs tablet Commonly known as: TYLENOL  PM Take by mouth.   gabapentin  300 MG capsule Commonly known as: NEURONTIN  Take 1 capsule (300 mg total) by mouth 3 (three) times daily.   HumaLOG  KwikPen 200 UNIT/ML KwikPen Generic drug: insulin  lispro Inject 14-24 Units into the skin 3 (three) times daily.   hydrALAZINE  50 MG tablet Commonly known as: APRESOLINE  Take 1 tablet (50 mg total) by mouth in the morning and at bedtime.   Insupen Pen Needles 32G X 4 MM Misc Generic drug: Insulin  Pen Needle Use in the morning, at noon, in the evening, and at bedtime.   levothyroxine  75 MCG tablet Commonly known as: Synthroid  Take 1 tablet (75 mcg  total) by mouth daily before breakfast.   lidocaine -prilocaine  cream Commonly known as: EMLA  Apply 1 Application topically as needed (for port access).   losartan  100 MG tablet Commonly known as: COZAAR  Take 1 tablet (100 mg total) by mouth at bedtime.   NIFEdipine  30 MG 24 hr tablet Commonly known as: PROCARDIA -XL/NIFEDICAL-XL Take 1 tablet (30 mg total) by mouth in the morning and at bedtime.   Opdivo  100 MG/10ML Soln chemo injection Generic drug: nivolumab  Inject into the vein.   pantoprazole  40 MG tablet Commonly known as: Protonix  Take 1 tablet (40 mg total) by mouth 2 (two) times daily.   polyethylene glycol powder 17 GM/SCOOP powder Commonly known as: GLYCOLAX /MIRALAX  Take 17 g  by mouth daily as needed for mild constipation.   predniSONE  5 MG tablet Commonly known as: DELTASONE  Take 1 tablet (5 mg total) by mouth daily for 30 days, THEN 0.5 tablets (2.5 mg total) daily. Start taking on: January 02, 2024 What changed: Another medication with the same name was removed. Continue taking this medication, and follow the directions you see here. Changed by: Maude Crease, MD   triamcinolone  0.025 % ointment Commonly known as: KENALOG  Apply 1 Application topically 2 (two) times daily. May be used for up to 2 weeks at a time. Restart as needed after 2 week break from this medication   True Metrix Blood Glucose Test test strip Generic drug: glucose blood Use as instructed        Allergies:  Allergies  Allergen Reactions   Voriconazole  Shortness Of Breath and Swelling    Patient reported that tablets sent her to the ER   Bactrim [Sulfamethoxazole-Trimethoprim] Swelling and Other (See Comments)    Site of swelling not recalled   Clarithromycin  Swelling, Rash and Other (See Comments)    Daughter is unsure if Clarithromycin  or Amoxicillin  caused rash and swelling.  Patient states no allergy to amoxicillan - has taken this recently with no issues. (12/02/21)    Past Medical History, Surgical history, Social history, and Family History were reviewed and updated.  Review of Systems: Review of Systems  Constitutional: Negative.   HENT: Negative.    Eyes: Negative.   Respiratory: Negative.    Cardiovascular: Negative.   Gastrointestinal: Negative.   Genitourinary: Negative.   Musculoskeletal: Negative.   Skin: Negative.   Neurological:  Positive for tingling.  Endo/Heme/Allergies: Negative.   Psychiatric/Behavioral: Negative.       Physical Exam: Vital signs with temperature of 98.5.  Pulse 106.  Blood pressure 140/66.  Weight is 171 pounds.   Wt Readings from Last 3 Encounters:  02/01/24 171 lb (77.6 kg)  12/20/23 170 lb 8 oz (77.3 kg)  12/20/23  169 lb (76.7 kg)    Physical Exam Vitals reviewed.  HENT:     Head: Normocephalic and atraumatic.  Eyes:     Pupils: Pupils are equal, round, and reactive to light.     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     Cardiovascular:     Rate and Rhythm: Normal rate and regular rhythm.     Heart sounds: Normal heart sounds.  Pulmonary:     Effort: Pulmonary effort is normal.     Breath sounds: Normal breath sounds.  Abdominal:     General: Bowel sounds are normal.     Palpations: Abdomen is soft.     Comments: Abdominal exam shows a well-healed laparotomy scar.  She has no fluid wave.  There is no guarding or rebound tenderness to palpation.  There is no palpable liver or spleen tip.  Musculoskeletal:        General: No tenderness or deformity. Normal range of motion.     Cervical back: Normal range of motion.  Lymphadenopathy:     Cervical: No cervical adenopathy.  Skin:    General: Skin is warm and dry.     Findings: No erythema or rash.     Comments: Skin exam does show a  papular rash on her lower legs.  This is above and below the knees.  These lesions are somewhat firm.  They are raised.  They are none tender.    Neurological:     Mental Status: She is alert and oriented to person, place, and time.  Psychiatric:        Behavior: Behavior normal.        Thought Content: Thought content normal.        Judgment: Judgment normal.       Lab Results  Component Value Date   WBC 6.0 02/01/2024   HGB 14.2 02/01/2024   HCT 43.6 02/01/2024   MCV 89.2 02/01/2024   PLT 187 02/01/2024   Lab Results  Component Value Date   FERRITIN 230 08/17/2023   IRON  120 08/17/2023   TIBC 328 08/17/2023   UIBC 208 08/17/2023   IRONPCTSAT 37 (H) 08/17/2023   Lab Results  Component Value Date   RETICCTPCT 2.4 08/17/2023   RBC 4.89 02/01/2024   No results found for: KPAFRELGTCHN, LAMBDASER, KAPLAMBRATIO No results found for:  IGGSERUM, IGA, IGMSERUM No results found for: STEPHANY CARLOTA BENSON MARKEL EARLA JOANNIE DOC VICK, SPEI   Chemistry      Component Value Date/Time   NA 137 12/20/2023 1150   NA 140 01/22/2022 0849   K 4.1 12/20/2023 1150   CL 105 12/20/2023 1150   CO2 20 (L) 12/20/2023 1150   BUN 15 12/20/2023 1150   BUN 11 01/22/2022 0849   CREATININE 0.76 12/20/2023 1150   CREATININE 0.58 (L) 08/20/2021 1541      Component Value Date/Time   CALCIUM 9.9 12/20/2023 1150   ALKPHOS 56 12/20/2023 1150   AST 21 12/20/2023 1150   ALT 22 12/20/2023 1150   BILITOT 0.5 12/20/2023 1150       Impression and Plan: [name] is a very pleasant 78 yo female from [place] with hepatocellular carcinoma and Hepatitis C.  She actually has had treatment for the Hepatitis C.  I am sure that this is part of the reason why she has done so well with response.  I am happy that she is doing so well.  I have to believe that she is going to continue to do well.  I hate that she will miss Christmas here.  However, she will be with a friend who needs her.  We will plan to get her back in late January.  Her last MRI was back in September.  We may set up an MRI for her sometime in late Winter or early Spring   Maude JONELLE Crease, MD 12/17/202512:59 PM

## 2024-02-02 ENCOUNTER — Encounter: Payer: Self-pay | Admitting: Hematology & Oncology

## 2024-02-02 ENCOUNTER — Encounter: Payer: Self-pay | Admitting: Internal Medicine

## 2024-02-02 ENCOUNTER — Other Ambulatory Visit: Payer: Self-pay

## 2024-02-02 ENCOUNTER — Ambulatory Visit: Payer: Self-pay | Attending: Internal Medicine | Admitting: Internal Medicine

## 2024-02-02 VITALS — BP 143/74 | HR 90 | Ht 64.0 in | Wt 171.8 lb

## 2024-02-02 DIAGNOSIS — M25471 Effusion, right ankle: Secondary | ICD-10-CM

## 2024-02-02 DIAGNOSIS — E1159 Type 2 diabetes mellitus with other circulatory complications: Secondary | ICD-10-CM

## 2024-02-02 DIAGNOSIS — L280 Lichen simplex chronicus: Secondary | ICD-10-CM

## 2024-02-02 DIAGNOSIS — E1169 Type 2 diabetes mellitus with other specified complication: Secondary | ICD-10-CM

## 2024-02-02 DIAGNOSIS — E039 Hypothyroidism, unspecified: Secondary | ICD-10-CM

## 2024-02-02 DIAGNOSIS — I10 Essential (primary) hypertension: Secondary | ICD-10-CM

## 2024-02-02 DIAGNOSIS — Z23 Encounter for immunization: Secondary | ICD-10-CM

## 2024-02-02 DIAGNOSIS — Z794 Long term (current) use of insulin: Secondary | ICD-10-CM

## 2024-02-02 LAB — POCT GLYCOSYLATED HEMOGLOBIN (HGB A1C): HbA1c, POC (controlled diabetic range): 6.1 % (ref 0.0–7.0)

## 2024-02-02 LAB — AFP TUMOR MARKER: AFP, Serum, Tumor Marker: 5.1 ng/mL (ref 0.0–9.2)

## 2024-02-02 LAB — GLUCOSE, POCT (MANUAL RESULT ENTRY)
POC Glucose: 65 mg/dL — AB (ref 70–99)
POC Glucose: 91 mg/dL (ref 70–99)

## 2024-02-02 MED ORDER — LEVOTHYROXINE SODIUM 75 MCG PO TABS
75.0000 ug | ORAL_TABLET | Freq: Every day | ORAL | 3 refills | Status: AC
  Filled 2024-02-02: qty 60, 60d supply, fill #0

## 2024-02-02 MED ORDER — GLUCOSE 4 G PO CHEW: 3.0000 | CHEWABLE_TABLET | Freq: Once | ORAL | Status: AC

## 2024-02-02 MED ORDER — LOSARTAN POTASSIUM 100 MG PO TABS: 100.0000 mg | ORAL_TABLET | Freq: Every day | ORAL | 1 refills | Status: AC

## 2024-02-02 MED ORDER — NIFEDIPINE ER OSMOTIC RELEASE 30 MG PO TB24: 30.0000 mg | ORAL_TABLET | Freq: Two times a day (BID) | ORAL | 3 refills | Status: AC

## 2024-02-02 MED ORDER — HYDRALAZINE HCL 100 MG PO TABS
100.0000 mg | ORAL_TABLET | Freq: Two times a day (BID) | ORAL | 3 refills | Status: AC
  Filled 2024-02-02: qty 120, 60d supply, fill #0

## 2024-02-02 NOTE — Progress Notes (Signed)
 Patient ID: [name] [name], female    DOB: 08/02/1945  MRN: [id number]  CC: Medical Management of Chronic Issues (No question /Tes for flu vacc/ 2 months Medication refill for travelling )   Subjective: [name] [name] is a 78 y.o. female who presents for chronic ds management. Daughter is with her.  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   Discussed the use of AI scribe software for clinical note transcription with the patient, who gave verbal consent to proceed.  History of Present Illness   [name] [name] is a 78 year old female with hypertension, diabetes, and hypothyroidism who presents for follow-up. She is accompanied by her daughter.  HTN: should be on Cozaar  100 mg daily, Procardia  (nifedipine ) 30 mg twice a day, and hydralazine   50 mg twice a day after dose was increased from 25 mg twice a day. However, when she received the 50 mg tabs, she though they were still the 25 mg so she continued taking 2 twice a day not realizing that she was actually taking two of the 50 mg tabs twice a day for about 2 mths. Daughter just caught this and pt just started taking 50 mg BID 2-3 days ago. Despite this, her blood pressure readings have been in the 130s to 140s systolic range even when she was taking 100 mg BID by mistake, with diastolic readings in the 70s. SBP over past 3 days have been in the low 130s. She reports no symptoms such as headaches, dizziness, CP even when her blood pressure was higher.  DM:  Results for orders placed or performed in visit on 02/02/24  POCT glycosylated hemoglobin (Hb A1C)   Collection  Time: 02/02/24  4:03 PM  Result Value Ref Range   Hemoglobin A1C     HbA1c POC (<> result, manual entry)     HbA1c, POC (prediabetic range)     HbA1c, POC (controlled diabetic range) 6.1 0.0 - 7.0 %  POCT glucose (manual entry)   Collection Time: 02/02/24  4:04 PM  Result Value Ref Range   POC Glucose 65 (A) 70 - 99 mg/dl  POCT glucose (manual entry)   Collection Time: 02/02/24  4:53 PM  Result Value Ref Range   POC Glucose 91 70 - 99 mg/dl   *Note: Due to a large number of results and/or encounters for the requested time period, some results have not been displayed. A complete set of results can be found in Results Review.  Followed by Dr. Kellie; last seen 10/19/23 She is on Farxiga  10 mg daily, Lantus  insulin  at 14 units daily and Humalog  insulin  at 10 units with meals. BS is 65 mg/dL this afternoon in the office, which she attributes to insufficient food intake after taking insulin  for lunch. She checks her blood sugar three times a day, with morning readings typically around 108-115 mg/dL.  She is also on levothyroxine  for hypothyroidism and has recently picked up a refill. Her last TSH was 0.9 yesterday.  lichenoid dermatitis :She is currently on prednisone , tapered to 5 mg with no new skin lesions. She reports swelling and soreness in her RT foot several days ago. Pain has resolved but still some swelling.  She engages in short walks and wears shoes that lack adequate support per her daughter.   Will be visiting family in [place]  for 2 mths. Request 2 mth supply on meds to take with her.     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 08/21/2022   Elevated LFTs  08/21/2022   Hyponatremia 08/20/2022   Positive for macroalbuminuria 06/26/2022   Type 2 diabetes mellitus without complication, with long-term current use of insulin  (HCC) 06/24/2022   Type 2 diabetes mellitus with hyperglycemia, with long-term current use of insulin  (HCC) 01/18/2022   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   Hypercalcemia 09/22/2020   Chronic hepatitis C with hepatic coma (HCC) 09/22/2020   Goals of care, counseling/discussion 09/01/2020   Hepatocellular carcinoma (HCC) 08/14/2020   Hyperglycemia 08/10/2020   Pneumoperitoneum 08/09/2020   History of colorectal cancer 08/09/2020   Liver mass, left lobe 08/09/2020   Insulin -requiring or dependent type II diabetes mellitus (HCC) 08/09/2020   Hypertension associated with diabetes (HCC) 08/09/2020   Constipation, chronic 08/09/2020   Perforated gastric ulcer s/p lap omental Graham patch 08/09/2020 08/09/2020     Medications Ordered Prior to Encounter[1]  Allergies[2]  Social History   Socioeconomic History   Marital status: Widowed    Spouse name: Not on file   Number of children: 5   Years of education: 14   Highest education level: Associate degree: occupational, scientist, product/process development, or vocational program  Occupational History   Not on file  Tobacco Use   Smoking status: Never   Smokeless tobacco: Never  Vaping Use   Vaping status: Never Used  Substance and Sexual Activity   Alcohol  use: Not Currently   Drug use: Never   Sexual activity: Not Currently  Other Topics Concern   Not on file  Social History Narrative   Originally from [place].  Speaks English and French   Social Drivers of Health   Tobacco Use: Low Risk (02/02/2024)   Patient History    Smoking Tobacco Use: Never    Smokeless Tobacco Use: Never    Passive Exposure: Not on file  Financial Resource Strain: Medium Risk (10/03/2023)   Overall Financial Resource Strain (CARDIA)    Difficulty of Paying Living Expenses: Somewhat hard  Food Insecurity: No  Food Insecurity (10/03/2023)   Epic    Worried About Programme Researcher, Broadcasting/film/video in the Last Year: Never true    Ran Out of Food in the Last Year: Never true  Transportation Needs: No Transportation Needs (10/03/2023)   Epic    Lack of Transportation (Medical): No    Lack of Transportation (Non-Medical): No  Physical Activity: Insufficiently Active (10/03/2023)   Exercise Vital Sign    Days of Exercise per Week: 3 days    Minutes of Exercise per Session: 30 min  Stress: No Stress Concern Present (10/03/2023)   [employer] of Occupational Health - Occupational Stress Questionnaire    Feeling of Stress: Not at all  Social Connections: Moderately Integrated (10/03/2023)   Social Connection and Isolation Panel    Frequency of Communication with Friends and Family: More than three times a week    Frequency of Social Gatherings with Friends and Family: Once a week    Attends Religious  Services: More than 4 times per year    Active Member of Clubs or Organizations: Yes    Attends Banker Meetings: Never    Marital Status: Widowed  Intimate Partner Violence: Not At Risk (10/03/2023)   Epic    Fear of Current or Ex-Partner: No    Emotionally Abused: No    Physically Abused: No    Sexually Abused: No  Depression (PHQ2-9): Low Risk (02/01/2024)   Depression (PHQ2-9)    PHQ-2 Score: 0  Alcohol  Screen: Low Risk (10/03/2023)   Alcohol  Screen    Last Alcohol  Screening Score (AUDIT): 0  Housing: Low Risk (10/03/2023)   Epic    Unable to Pay for Housing in the Last Year: No    Number of Times Moved in the Last Year: 0    Homeless in the Last Year: No  Utilities: Not At Risk (10/03/2023)   Epic    Threatened with loss of utilities: No  Health Literacy: Adequate Health Literacy (10/03/2023)   B1300 Health Literacy    Frequency of need for help with medical instructions: Never    Family History  Problem Relation Age of Onset   Cancer Father        type unknown, was on the leg   Other  Daughter        Acoustic neuroma   Breast cancer Neg Hx    Colon cancer Neg Hx    Esophageal cancer Neg Hx    Rectal cancer Neg Hx    Stomach cancer Neg Hx     Past Surgical History:  Procedure Laterality Date   COLON RESECTION  2005   In [place] - ?colon cancer   IR IMAGING GUIDED PORT INSERTION  09/18/2020   LAPAROSCOPIC GASTRIC RESECTION N/A 08/09/2020   Procedure: LAPAROSCOPIC EXPLORATION OF ABDOMEN, PERFORATED ULCER REPAIR, LIVER BIOPSY;  Surgeon: Sheldon Standing, MD;  Location: [facility];  Service: General;  Laterality: N/A;    ROS: Review of Systems Negative except as stated above  PHYSICAL EXAM: BP (!) 143/74   Pulse 90   Ht 5' 4 (1.626 m)   Wt 171 lb 12.8 oz (77.9 kg)   SpO2 97%   BMI 29.49 kg/m   Physical Exam  General appearance - alert, well appearing, and in no distress Mental status - normal mood, behavior, speech, dress, motor activity, and thought processes Neck - supple, no significant adenopathy Chest - clear to auscultation, no wheezes, rales or rhonchi, symmetric air entry Heart - normal rate, regular rhythm, normal S1, S2, no murmurs, rubs, clicks or gallops Extremities - peripheral pulses normal, no edema in lower legs. Mild edema RT foot without tenderness, fluctuance or erythema. Good DP/PT pulses in this foot Skin - a few slightly raised hyperpigment stops on shin RT leg      Latest Ref Rng & Units 02/01/2024   12:40 PM 12/20/2023   11:50 AM 11/09/2023   12:40 PM  CMP  Glucose 70 - 99 mg/dL 897  770  693   BUN 8 - 23 mg/dL 16  15  20    Creatinine 0.44 - 1.00 mg/dL 9.24  9.23  9.02   Sodium 135 - 145 mmol/L 140  137  129   Potassium 3.5 - 5.1 mmol/L 4.0  4.1  3.8   Chloride 98 - 111 mmol/L 106  105  97   CO2 22 - 32 mmol/L 22  20  18    Calcium 8.9 - 10.3 mg/dL 89.6  9.9  9.7  Total Protein 6.5 - 8.1 g/dL 7.3  7.0  6.8   Total Bilirubin 0.0 - 1.2 mg/dL 0.4  0.5  0.6   Alkaline Phos 38 - 126 U/L 62  56  57   AST 15 - 41 U/L 23  21  15    ALT 0  - 44 U/L 25  22  24     Lipid Panel  No results found for: CHOL, TRIG, HDL, CHOLHDL, VLDL, LDLCALC, LDLDIRECT  CBC    Component Value Date/Time   WBC 6.0 02/01/2024 1240   WBC 5.9 08/22/2022 0345   RBC 4.89 02/01/2024 1240   HGB 14.2 02/01/2024 1240   HCT 43.6 02/01/2024 1240   PLT 187 02/01/2024 1240   MCV 89.2 02/01/2024 1240   MCH 29.0 02/01/2024 1240   MCHC 32.6 02/01/2024 1240   RDW 16.8 (H) 02/01/2024 1240   LYMPHSABS 0.7 02/01/2024 1240   MONOABS 0.3 02/01/2024 1240   EOSABS 0.0 02/01/2024 1240   BASOSABS 0.0 02/01/2024 1240    ASSESSMENT AND PLAN: 1. Type 2 diabetes mellitus with other specified complication with long term use of insulin  (HCC) (Primary) -Patient with hypoglycemia in office today and was asymptomatic.  Discussed danger of hypoglycemic unawareness. She apparently gave herself 10 units of Humalog  but ate a small meal.  Advised that she can give half that dose if her meal is smaller.  Advised to always keep something with her in her purse like peanut butter crackers to eat if blood sugars drop below. At goal.  Continue Farxiga  10 mg daily Lantus  14 units daily and Humalog  10 units with large meals but 5 with smaller meals. Given 3 glucose tabs here today - POCT glucose (manual entry) - POCT glycosylated hemoglobin (Hb A1C) - POCT glucose (manual entry)  2. Hypertension associated with diabetes (HCC) BP in last 3 days close to goal.  BP today in the office higher than home readings.  Likely has a component of whitecoat hypertension. Okay to continue Hydralazine  as 100 mg BID.  Continue nifedipine  and Cozaar .  Continue to monitor blood pressure. - hydrALAZINE  (APRESOLINE ) 100 MG tablet; Take 1 tablet (100 mg total) by mouth in the morning and at bedtime.  Dispense: 120 tablet; Refill: 3 - NIFEdipine  (PROCARDIA -XL/NIFEDICAL-XL) 30 MG 24 hr tablet; Take 1 tablet (30 mg total) by mouth in the morning and at bedtime.  Dispense: 120 tablet; Refill:  3 - losartan  (COZAAR ) 100 MG tablet; Take 1 tablet (100 mg total) by mouth at bedtime.  Dispense: 90 tablet; Refill: 1  3. Acquired hypothyroidism Controlled - levothyroxine  (SYNTHROID ) 75 MCG tablet; Take 1 tablet (75 mcg total) by mouth daily before breakfast.  Dispense: 60 tablet; Refill: 3  4. Lichenoid dermatitis Followed by [facility] derm; Prednisone  being tapered slowly  5. Need for influenza vaccination - Flu vaccine HIGH DOSE PF(Fluzone Trivalent)  6. Edema of right ankle ? Etiology possibly related to footwear or activity. Gout unlikely due to fact she is on prednisone  chronically for her skin condition - Advise her to elevate foot when sitting or lying down. - Recommend wearing supportive shoes with good soles. - Monitor for changes in swelling or pain.   Patient was given the opportunity to ask questions.  Patient verbalized understanding of the plan and was able to repeat key elements of the plan.   This documentation was completed using Paediatric nurse.  Any transcriptional errors are unintentional.  Orders Placed This Encounter  Procedures   Flu vaccine HIGH DOSE PF(Fluzone  Trivalent)   POCT glucose (manual entry)   POCT glycosylated hemoglobin (Hb A1C)   POCT glucose (manual entry)     Requested Prescriptions   Signed Prescriptions Disp Refills   hydrALAZINE  (APRESOLINE ) 100 MG tablet 120 tablet 3    Sig: Take 1 tablet (100 mg total) by mouth in the morning and at bedtime.   NIFEdipine  (PROCARDIA -XL/NIFEDICAL-XL) 30 MG 24 hr tablet 120 tablet 3    Sig: Take 1 tablet (30 mg total) by mouth in the morning and at bedtime.   losartan  (COZAAR ) 100 MG tablet 90 tablet 1    Sig: Take 1 tablet (100 mg total) by mouth at bedtime.   levothyroxine  (SYNTHROID ) 75 MCG tablet 60 tablet 3    Sig: Take 1 tablet (75 mcg total) by mouth daily before breakfast.    Return in about 4 months (around 06/02/2024).  Barnie Louder, MD, FACP     [1]  Current  Outpatient Medications on File Prior to Visit  Medication Sig Dispense Refill   brimonidine  (ALPHAGAN ) 0.15 % ophthalmic solution Place 1 drop into the left eye 2 (two) times daily. 15 mL 11   dapagliflozin  propanediol (FARXIGA ) 10 MG TABS tablet Take 1 tablet (10 mg total) by mouth daily before breakfast. 90 tablet 3   diphenhydramine -acetaminophen  (TYLENOL  PM) 25-500 MG TABS tablet Take by mouth.     glucose blood (TRUE METRIX BLOOD GLUCOSE TEST) test strip Use as instructed 100 each 12   insulin  glargine (LANTUS ) 100 UNIT/ML Solostar Pen Inject 12 Units into the skin daily. 15 mL 6   insulin  lispro (HUMALOG  KWIKPEN) 200 UNIT/ML KwikPen Inject 14-24 Units into the skin 3 (three) times daily. 75 mL 3   Insulin  Pen Needle (TRUEPLUS 5-BEVEL PEN NEEDLES) 32G X 4 MM MISC Use in the morning, at noon, in the evening, and at bedtime. 400 each 3   nivolumab  (OPDIVO ) 100 MG/10ML SOLN chemo injection Inject into the vein.     pantoprazole  (PROTONIX ) 40 MG tablet Take 1 tablet (40 mg total) by mouth 2 (two) times daily. 180 tablet 0   polyethylene glycol powder (GLYCOLAX /MIRALAX ) 17 GM/SCOOP powder Take 17 g by mouth daily as needed for mild constipation.     predniSONE  (DELTASONE ) 5 MG tablet Take 1 tablet (5 mg total) by mouth daily for 30 days, THEN 0.5 tablets (2.5 mg total) daily. 45 tablet 0   gabapentin  (NEURONTIN ) 300 MG capsule Take 1 capsule (300 mg total) by mouth 3 (three) times daily. (Patient not taking: Reported on 02/02/2024) 90 capsule 4   lidocaine -prilocaine  (EMLA ) cream Apply 1 Application topically as needed (for port access). (Patient not taking: Reported on 02/02/2024)     triamcinolone  (KENALOG ) 0.025 % ointment Apply 1 Application topically 2 (two) times daily. May be used for up to 2 weeks at a time. Restart as needed after 2 week break from this medication (Patient not taking: Reported on 02/02/2024) 454 g 0   Current Facility-Administered Medications on File Prior to Visit   Medication Dose Route Frequency Provider Last Rate Last Admin   0.9 %  sodium chloride  infusion   Intravenous Once Ennever, Peter R, MD       0.9 %  sodium chloride  infusion   Intravenous Continuous Tonette Lauraine HERO, PA-C   Stopped at 08/03/23 1353   alteplase  (CATHFLO ACTIVASE ) injection 2 mg  2 mg Intracatheter Once PRN Covington, Sarah M, PA-C       heparin  lock flush 100 unit/mL  500 Units Intracatheter Once  PRN Ennever, Peter R, MD       heparin  lock flush 100 unit/mL  250 Units Intracatheter Once PRN Covington, Sarah M, PA-C       sodium chloride  flush (NS) 0.9 % injection 10 mL  10 mL Intravenous PRN Franchot Lauraine HERO, NP   10 mL at 12/18/20 1009   sodium chloride  flush (NS) 0.9 % injection 10 mL  10 mL Intracatheter Once PRN Timmy Maude SAUNDERS, MD       sodium chloride  flush (NS) 0.9 % injection 3 mL  3 mL Intracatheter Once PRN Covington, Sarah M, PA-C      [2]  Allergies Allergen Reactions   Voriconazole  Shortness Of Breath and Swelling    Patient reported that tablets sent her to the ER   Bactrim [Sulfamethoxazole-Trimethoprim] Swelling and Other (See Comments)    Site of swelling not recalled   Clarithromycin  Swelling, Rash and Other (See Comments)    Daughter is unsure if Clarithromycin  or Amoxicillin  caused rash and swelling.  Patient states no allergy to amoxicillan - has taken this recently with no issues. (12/02/21)

## 2024-02-03 ENCOUNTER — Other Ambulatory Visit: Payer: Self-pay

## 2024-02-03 ENCOUNTER — Ambulatory Visit: Payer: Self-pay | Admitting: Hematology & Oncology

## 2024-02-03 ENCOUNTER — Telehealth: Payer: Self-pay | Admitting: Internal Medicine

## 2024-02-03 ENCOUNTER — Encounter: Payer: Self-pay | Admitting: *Deleted

## 2024-02-03 NOTE — Telephone Encounter (Signed)
 Contacted patients sister and confirmed that the patient already has an appointment scheduled for April.

## 2024-02-03 NOTE — Telephone Encounter (Signed)
-----   Message from Barnie Louder, MD sent at 02/02/2024 11:10 PM EST ----- Regarding: Appt Igve 4 mth f/u visit in April 2026

## 2024-03-14 ENCOUNTER — Inpatient Hospital Stay: Payer: Self-pay | Admitting: Hematology & Oncology

## 2024-03-14 ENCOUNTER — Inpatient Hospital Stay: Payer: Self-pay

## 2024-03-28 ENCOUNTER — Inpatient Hospital Stay: Payer: Self-pay

## 2024-03-28 ENCOUNTER — Inpatient Hospital Stay: Payer: Self-pay | Admitting: Hematology & Oncology

## 2024-03-28 ENCOUNTER — Inpatient Hospital Stay: Payer: Self-pay | Attending: Hematology & Oncology

## 2024-04-23 ENCOUNTER — Ambulatory Visit: Payer: Self-pay | Admitting: Internal Medicine

## 2024-05-09 ENCOUNTER — Inpatient Hospital Stay: Payer: Self-pay | Admitting: Hematology & Oncology

## 2024-05-09 ENCOUNTER — Inpatient Hospital Stay: Payer: Self-pay

## 2024-05-09 ENCOUNTER — Inpatient Hospital Stay: Payer: Self-pay | Attending: Hematology & Oncology

## 2024-05-31 ENCOUNTER — Ambulatory Visit: Payer: Self-pay | Admitting: Internal Medicine
